# Patient Record
Sex: Male | Born: 1950 | Race: Black or African American | Hispanic: No | Marital: Married | Smoking: Never smoker
Health system: Southern US, Community
[De-identification: ages and names within clinical notes are randomized; demographics above are authoritative.]

## PROBLEM LIST (undated history)

## (undated) DIAGNOSIS — N200 Calculus of kidney: Secondary | ICD-10-CM

## (undated) DIAGNOSIS — K635 Polyp of colon: Secondary | ICD-10-CM

## (undated) DIAGNOSIS — S82009A Unspecified fracture of unspecified patella, initial encounter for closed fracture: Secondary | ICD-10-CM

## (undated) DIAGNOSIS — E1140 Type 2 diabetes mellitus with diabetic neuropathy, unspecified: Secondary | ICD-10-CM

## (undated) DIAGNOSIS — I10 Essential (primary) hypertension: Secondary | ICD-10-CM

## (undated) DIAGNOSIS — E119 Type 2 diabetes mellitus without complications: Secondary | ICD-10-CM

## (undated) HISTORY — PX: ORIF PATELLA: SHX5033

---

## 1999-05-24 ENCOUNTER — Emergency Department (HOSPITAL_COMMUNITY): Admission: EM | Admit: 1999-05-24 | Discharge: 1999-05-24 | Payer: Self-pay | Admitting: Emergency Medicine

## 1999-06-26 ENCOUNTER — Encounter (INDEPENDENT_AMBULATORY_CARE_PROVIDER_SITE_OTHER): Payer: Self-pay | Admitting: Specialist

## 1999-06-26 ENCOUNTER — Ambulatory Visit (HOSPITAL_COMMUNITY): Admission: RE | Admit: 1999-06-26 | Discharge: 1999-06-26 | Payer: Self-pay | Admitting: Gastroenterology

## 2003-05-15 ENCOUNTER — Emergency Department (HOSPITAL_COMMUNITY): Admission: EM | Admit: 2003-05-15 | Discharge: 2003-05-15 | Payer: Self-pay | Admitting: *Deleted

## 2004-07-06 ENCOUNTER — Encounter: Admission: RE | Admit: 2004-07-06 | Discharge: 2004-07-06 | Payer: Self-pay | Admitting: Family Medicine

## 2004-07-06 IMAGING — CR DG CHEST 2V
2 series · 2 of 2 positions shown · non-contrast
Comparison: none

CLINICAL DATA: Hyperglycemia.
 CHEST X-RAY:
 Two views of the chest show the lungs to be clear.  Minimal peribronchial thickening is noted.  The heart is within upper limits of normal.  No bony abnormality is seen.

[view not recorded (1 of 2)]
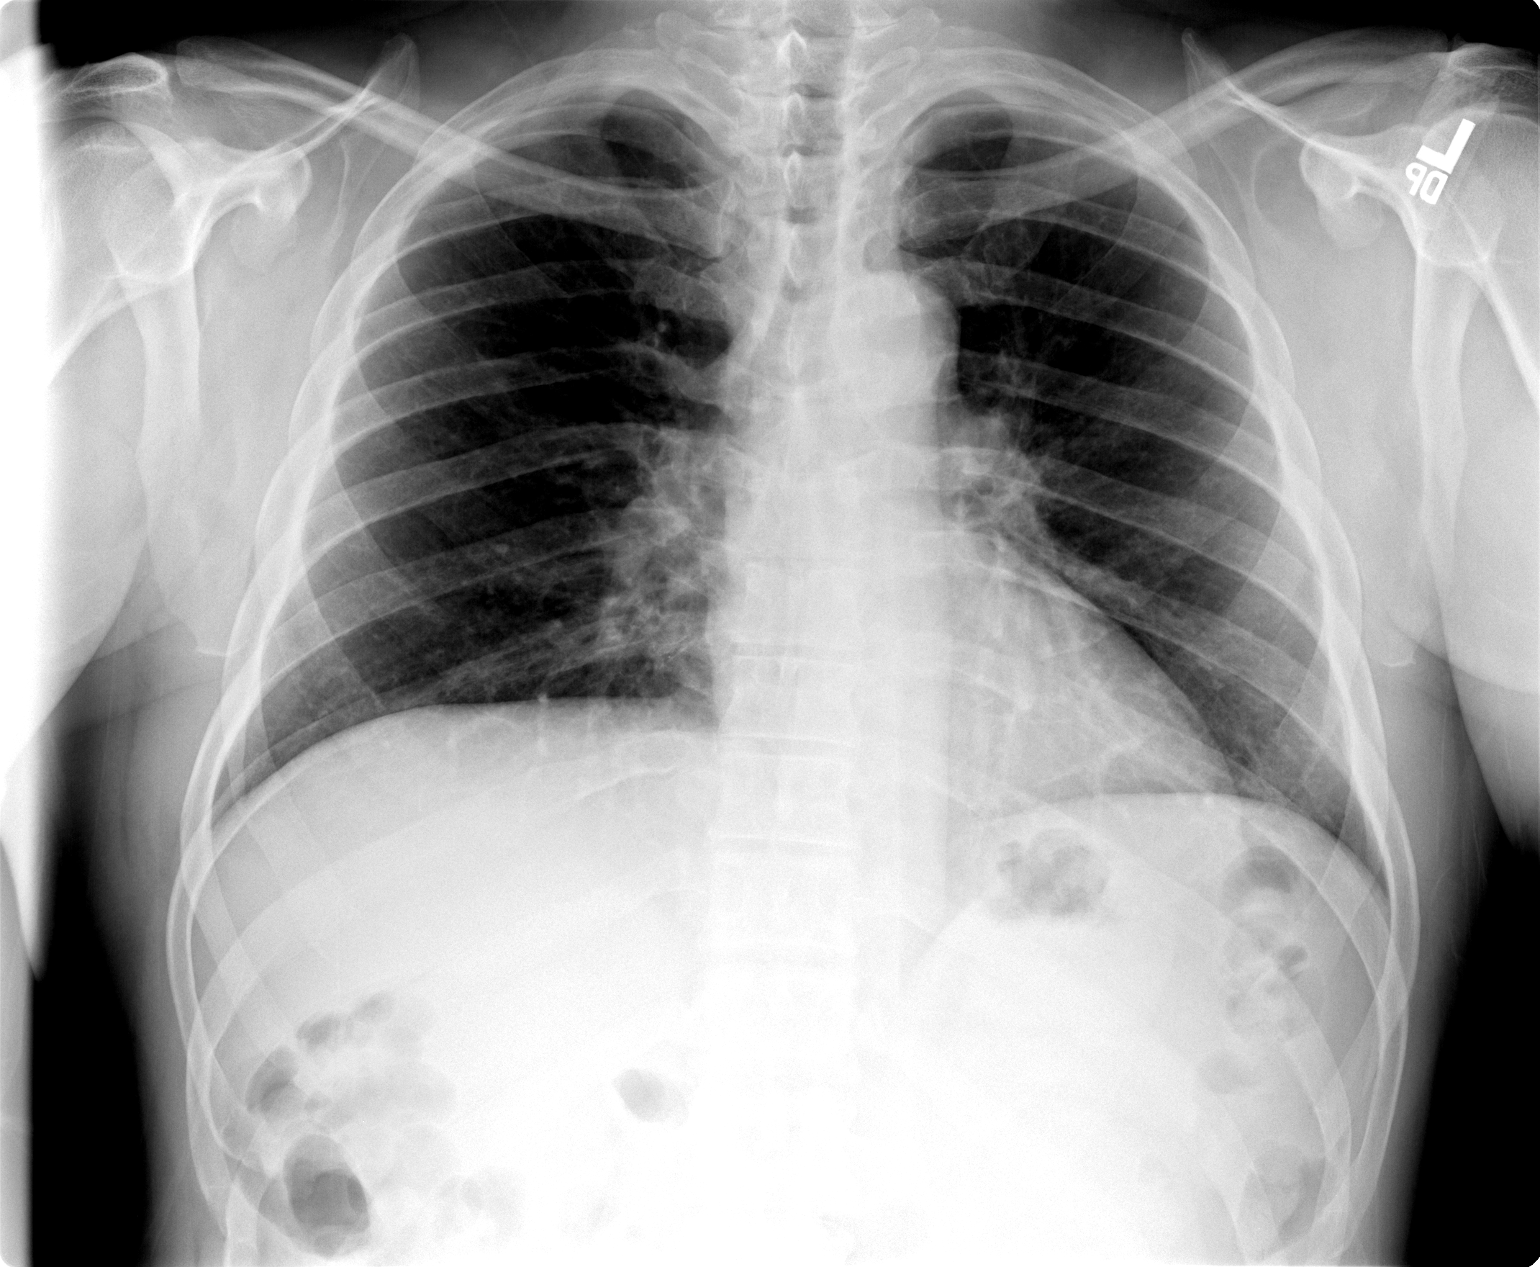

[view not recorded (2 of 2)]
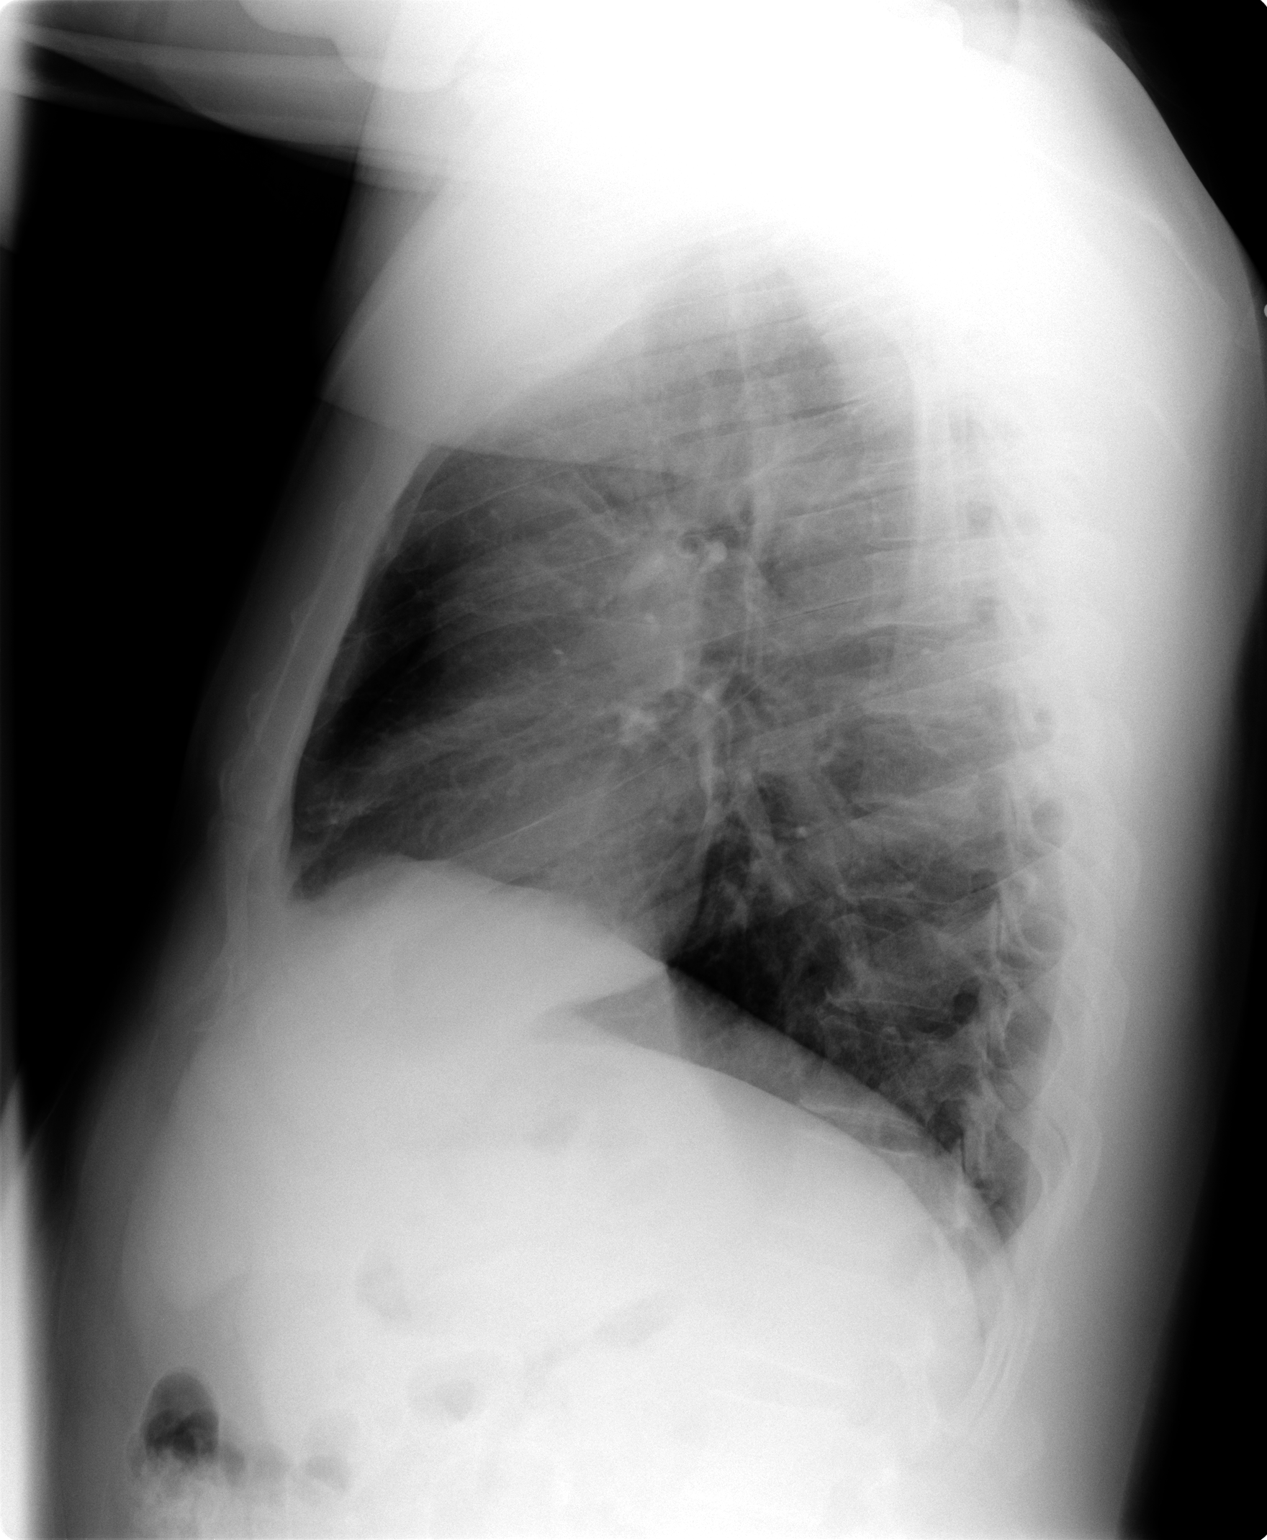

[2 of 2 positions shown; findings below may reference images not displayed]

IMPRESSION: No active lung disease.

## 2004-12-15 ENCOUNTER — Observation Stay (HOSPITAL_COMMUNITY): Admission: EM | Admit: 2004-12-15 | Discharge: 2004-12-15 | Payer: Self-pay | Admitting: *Deleted

## 2005-01-03 ENCOUNTER — Ambulatory Visit (HOSPITAL_COMMUNITY): Admission: RE | Admit: 2005-01-03 | Discharge: 2005-01-03 | Payer: Self-pay | Admitting: Gastroenterology

## 2009-12-21 ENCOUNTER — Emergency Department (HOSPITAL_COMMUNITY): Admission: EM | Admit: 2009-12-21 | Discharge: 2009-12-21 | Payer: Self-pay | Admitting: Emergency Medicine

## 2010-07-07 NOTE — Procedures (Signed)
[facility]  Patient:    Timothy Davenport, Timothy Davenport                        MRN: [id number] Proc. Date: 06/26/99 Attending:  Verlin Grills, M.D. CC:         Jethro Bastos, M.D., [facility]                           Procedure Report  PROCEDURES: 1. Colonoscopy. 2. Polypectomy.  REFERRING PHYSICIAN:  Jethro Bastos, M.D.  INDICATIONS:  Timothy Davenport is a 60 year old male.  On April 27, 1999, he underwent his first health maintenance flexible proctosigmoidoscopy.  A 7 mm polyp was discovered at approximately 15 cm from the anal verge.  Timothy Davenport is referred for diagnostic colonoscopy and polypectomy.  I discussed with Timothy Davenport the complications associated with colonoscopy and polypectomy, including a 15:1000 risk of bleeding and 4:1000 risk of intestinal perforation.  Timothy Davenport has signed the operative permit.  MEDICATION ALLERGIES:  None.  CHRONIC MEDICATIONS:  Lotensin and baby aspirin.  PAST MEDICAL HISTORY:  Hypertension and left knee surgery.  HABITS:  Timothy Davenport does not smoke cigarettes.  He consumes alcohol in moderation.  ENDOSCOPIST:  Verlin Grills, M.D.  PREMEDICATION:  Demerol 50 mg and Versed 7 mg.  ENDOSCOPE:  Olympus pediatric colonoscope.  DESCRIPTION OF PROCEDURE:  After obtaining informed consent, the patient was placed in the left lateral decubitus position.  I administered intravenous Demerol and intravenous Versed to achieve conscious sedation for the procedure.  The patients blood pressure, oxygen saturation, and cardiac rhythm were monitored throughout the procedure and documented in the medical record.  Anal inspection was normal.  The digital rectal exam revealed a non-nodular prostate.  The Olympus pediatric video colonoscope was introduced into the rectum and under direct vision and advanced to the cecum as identified by normal-appearing ileocecal valve and appendiceal  orifice.  Colon preparation for the exam today was excellent.  Rectal:  From the proximal rectum at 15 cm from the anal verge, a 5 mm sessile polyp was removed with the electrocautery snare and submitted for pathological interpretation.  Sigmoid colon and descending colon:  Left colonic diverticulosis.  Splenic flexure normal:  Normal.  Transverse colon:  Normal.  Hepatic flexure:  Normal.  Ascending colon:  Normal.  Cecum and ileocecal valve:  Normal.  ASSESSMENT: 1. From the proximal, a 5 mm sessile polyp was removed with electrocautery    snare and submitted for pathologic interpretation. 2. Left colonic diverticulitis.  RECOMMENDATIONS:  If the polyp returns adenomatous, Timothy Davenport should undergo a repeat colonoscopy in five years. DD:  06/26/99 TD:  06/27/99 Job: [id number]

## 2010-07-07 NOTE — Discharge Summary (Signed)
NAME:  Timothy Davenport, Timothy Davenport NO.:  [id number]   MEDICAL RECORD NO.:  [id number]          PATIENT TYPE:  OBV   LOCATION:  1824                         FACILITY:  [facility]   PHYSICIAN:  Melissa L. Ladona Ridgel, MD  DATE OF BIRTH:  Nov 27, 1950   DATE OF ADMISSION:  12/14/2004  DATE OF DISCHARGE:                                 DISCHARGE SUMMARY   DISCHARGING DIAGNOSES:  1.  Angioedema. The patient's only medication that could contribute to      angioedema was his Lotrel which has been held. The patient was admitted      with tongue-swelling, hypotension, and was treated with epinephrine,      Benadryl, Pepcid, and methylprednisolone with good results. The      patient's blood pressure at its lowest was 78/44. This a.m., blood      pressure has returned to 121/78. He has no tongue swelling. He is able      to swallow without difficulty. His blood sugars, however, are 286.  2.  Diabetes. Elevated blood sugars, hyperglycemia secondary to baseline      diabetes and steroids. The patient will be treated in the emergency room      with sliding scale insulin. He will be started on his metformin again      and given a dose of Glucotrol XL. He will also be given a prescription      for Glucotrol to take home over the weekend, and prescriptions for a      Glucometer, strips, and lancets. The patient will be instructed if his      blood sugar is less than 80 that he should discontinue his Glucotrol. If      his blood sugar is greater than 250, he should report to the Urgent Care      Center or emergency room for further treatment.  3.  Hypertension. At this time, the patient will remain on his      hydrochlorothiazide. I will ask Dr. Dorothe Pea to reevaluate his blood      pressure on Monday and consider adding another agent at that time.      Currently, his blood pressure is well controlled and with the      hypotension related to his disease, I do not want to add another agent      right this  moment.   MEDICATION LIST:  1.  Pepcid 20 mg twice daily until prednisone is discontinued.  2.  Prednisone 40 mg tapered down to 10 mg and then off.  3.  An EpiPen will be provided prescription for him.  4.  Metformin 500 mg twice a day.  5.  Hydrochlorothiazide 25 mg once daily.  6.  Glucotrol XL 5 mg once daily. The next dose will be on December 16, 2004      as he will be given a dose in the emergency room.  7.  He will be instructed not to take his Lotrel.  8.  Aspirin 81 mg once daily.  9.  He will be given a prescription for a Glucometer, strips, and lancets.  He will be instructed to check his blood sugar in the a.m. and the p.m.      If his blood sugar is less than 80 he should hold his Glucotrol. If his      blood sugars are greater than 250, he should go to the Urgent Care or      emergency room for further evaluation.   The patient will be instructed to see Dr. Marny Lowenstein on Monday for a  recheck of his blood pressure and blood sugars. I have spoken with Dr.  Dorothe Pea and updated him on his condition. The patient will be restricted to  a low carbohydrate diet.   On the day of discharge, the patient's blood pressure is 120/78, pulse is  73, respirations 20-26. Temperature is 98.5. He is a well-developed, well-  nourished African-American male in no acute distress. He is normocephalic,  atraumatic. Pupils equal, round, and reactive to light. Extraocular muscles  are intact. Mucous membranes are moist. He has no tongue swelling, no lip  swelling. Neck is supple. There is no JVD, no lymph nodes, and no carotid  bruits. His chest is clear to auscultation. No rhonchi, rales, or wheezes.  Cardiovascular is regular rate and rhythm, positive S1 and S2. No S3 or S4.  No murmurs, rubs or gallop. Abdomen is soft, nontender, nondistended, with  positive bowel sounds. Extremities show no clubbing, cyanosis, or edema.  Neurologically, he is awake, alert, oriented, and cranial  nerves II-XII are  intact. Power is 5/5, DTRs are 2+.   Currently pending is an i-STAT 8 and creatinine. Addendum will be provided  for this value. I would like to make sure his creatinine is appropriate for  the use of Glucotrol. If his creatinine is stable and his blood sugar is  better controlled, will discharge him to home from the emergency room to  follow up with Dr. Dorothe Pea on Monday. Please await addendum to this  discharge summary.      Melissa L. Ladona Ridgel, MD  Electronically Signed     MLT/MEDQ  D:  12/15/2004  T:  12/15/2004  Job:  [id number]   cc:   Jethro Bastos, M.D.  Fax: [phone]

## 2010-07-07 NOTE — H&P (Signed)
NAME:  Timothy Davenport, Timothy Davenport NO.:  [id number]   MEDICAL RECORD NO.:  [id number]          PATIENT TYPE:  OBV   LOCATION:  1824                         FACILITY:  [facility]   PHYSICIAN:  Jackie Plum, M.D.DATE OF BIRTH:  06/26/1950   DATE OF ADMISSION:  12/14/2004  DATE OF DISCHARGE:                                HISTORY & PHYSICAL   CHIEF COMPLAINT:  Swelling of the tongue.   HISTORY OF PRESENT ILLNESS:  The patient is a 60 year old African-American  gentleman with a history of hypertension and diabetes mellitus on  hydrochlorothiazide, Lotrel, aspirin, and metformin.  The patient came into  the ED with a 2-hour history of tongue swelling.  He had not taken any new  medications, be it over-the-counter or prescribed.  He had not eaten any new  or unusual food substances including seafood.  He came because he realized  his tongue had gotten progressively swollen this evening.  He denied any  chest pain or shortness of breath.  He had some difficulties handling his  secretions.  He had taken his Lotrel a couple of hours prior to the onset of  symptoms; however, he had taken Lotrel for a long time, according to the  patient.  He denied any fever, chills, cough, sputum production, joint  pains, abdominal pain, nausea, vomiting, diarrhea, constipation, frequency  of micturition, or dysuria.  In the emergency room, the patient was noted to  have a very swollen tongue which was protruding from his mouth according to  ED records.  He was seen immediately by the ED physician, whereupon IV  access was obtained, and IV Benadryl with IV Pepcid, as well as IV steroids  were initiated.  According to the patient, his symptoms improved  significantly post treatment, and _________ were asked to evaluate for  admission after receiving a subcutaneous injection of 0.3 mg of epinephrine  1:1000.   PAST MEDICAL HISTORY:  As stated above.   MEDICATIONS:  As stated above.   PAST MEDICAL  HISTORY:  Diabetes mellitus.   SOCIAL HISTORY:  The patient lives with his wife.  He smokes __________ of  cigarettes daily, according to him.   REVIEW OF SYSTEMS:  Significant positives and negatives as stated above;  otherwise unremarkable.   PHYSICAL EXAMINATION:  VITAL SIGNS:  Blood pressure 145/94, pulse 90,  respirations 22, temperature 97.1 degrees Fahrenheit.  Pulse oximetry of 99%  on oxygen 2 liters per nasal cannula.  GENERAL:  The patient was comfortable-looking, not in acute cardiopulmonary  distress.  HEENT:  Pupils equal, round and reactive to light.  Extraocular movements  were intact.  Oropharynx - he had a swollen tongue.  There was no scleral  pallor or edema.  There was no scleral icterus, as well.  NECK:  Supple.  No JVD.  CARDIOPULMONARY:  Auscultation revealed clear pulmonary lungs fields and  regular rate and rhythm.  ABDOMEN:  Soft.  Bowel sounds present.  Nontender.  EXTREMITIES:  The patient did not have any edema.  There was no cyanosis.  NEUROLOGIC:  Sensory exam was nonfocal.   LABORATORY DATA:  Lab work was reviewed.  Point of care cardiac markers were  obtained by the ED physician which were within normal limits.   IMPRESSION:  Angioedema, acute.  Precipitating factor probably is the Lotrel  (ACE inhibitor).  The patient will be admitted for observation, continued on  steroids and Pepcid with Benadryl.  We will keep subcutaneous epinephrine  injections at the bedside.  The patient will be discharged tomorrow.  He  will need an epinephrine pen, and referred to allergy and immunology for  further evaluation as an outpatient.      Jackie Plum, M.D.  Electronically Signed     GO/MEDQ  D:  12/15/2004  T:  12/15/2004  Job:  [id number]   cc:   Dr. Prudencio Burly(?)  [facility]

## 2010-07-07 NOTE — Op Note (Signed)
NAME:  Timothy Davenport, Timothy Davenport NO.:  [id number]   MEDICAL RECORD NO.:  [id number]          PATIENT TYPE:  AMB   LOCATION:  ENDO                         [facility]AC[facility]  [facility]   PHYSICIAN:  Danise Edge, M.D.   DATE OF BIRTH:  1950-03-28   DATE OF PROCEDURE:  01/03/2005  DATE OF DISCHARGE:                                 OPERATIVE REPORT   PROCEDURE:  Colonoscopy.   REFERRING PHYSICIAN:  Dr. Marny Lowenstein.   INDICATIONS:  Mr. [name] is a 60 year old male born may April 29, 1950. Five years ago, [name] underwent his first screening colonoscopy;  a 7 mm adenomatous polyp was removed from the rectum.   ENDOSCOPIST:  Danise Edge, M.D.   PREMEDICATION:  Versed 6 mg, Demerol 50 mg.   DESCRIPTION OF PROCEDURE:  After obtaining informed consent, [name] was  placed in the left lateral decubitus position. I administered intravenous  Demerol and intravenous Versed to achieve conscious sedation for the  procedure. The patient's blood pressure, oxygen saturation and cardiac  rhythm were monitored throughout the procedure and documented in the medical  record.   Anal inspection was normal. Digital digital rectal exam reveals a non-  nodular prostate. The Olympus adjustable pediatric colonoscope was  introduced into the rectum and advanced to the cecum. A normal-appearing  appendiceal orifice and ileocecal valve were identified. Colonic preparation  for the exam today was satisfactory.   RECTUM:  Normal. I was unable to perform a retroflexed view of the distal  rectum.  SIGMOID COLON AND DESCENDING COLON:  Left colonic diverticulosis.  SPLENIC FLEXURE:  Normal.  TRANSVERSE COLON:  Normal.  HEPATIC FLEXURE:  Normal.  ASCENDING COLON:  Normal.  CECUM AND ILEOCECAL VALVE:  Normal.   ASSESSMENT:  Normal screening proctocolonoscopy to the cecum. Left colonic  diverticulosis present.   RECOMMENDATIONS:  Repeat colonoscopy in 5 years.     ______________________________  Danise Edge, M.D.     MJ/MEDQ  D:  01/03/2005  T:  01/03/2005  Job:  [id number]   cc:   Jethro Bastos, M.D.  Fax: [phone]

## 2011-02-06 ENCOUNTER — Other Ambulatory Visit: Payer: Self-pay | Admitting: Family Medicine

## 2011-02-06 ENCOUNTER — Ambulatory Visit
Admission: RE | Admit: 2011-02-06 | Discharge: 2011-02-06 | Disposition: A | Discharge status: Home / Self Care | Payer: BC Managed Care – PPO | Source: Ambulatory Visit | Attending: Family Medicine | Admitting: Family Medicine

## 2011-02-06 DIAGNOSIS — R609 Edema, unspecified: Secondary | ICD-10-CM

## 2011-02-06 IMAGING — CR DG KNEE 1-2V*R*
2 series · 2 of 2 positions shown · non-contrast
Comparison: None.

CLINICAL DATA: Pain and swelling, no trauma

RIGHT KNEE - 1-2 VIEW

[t knee ap right]
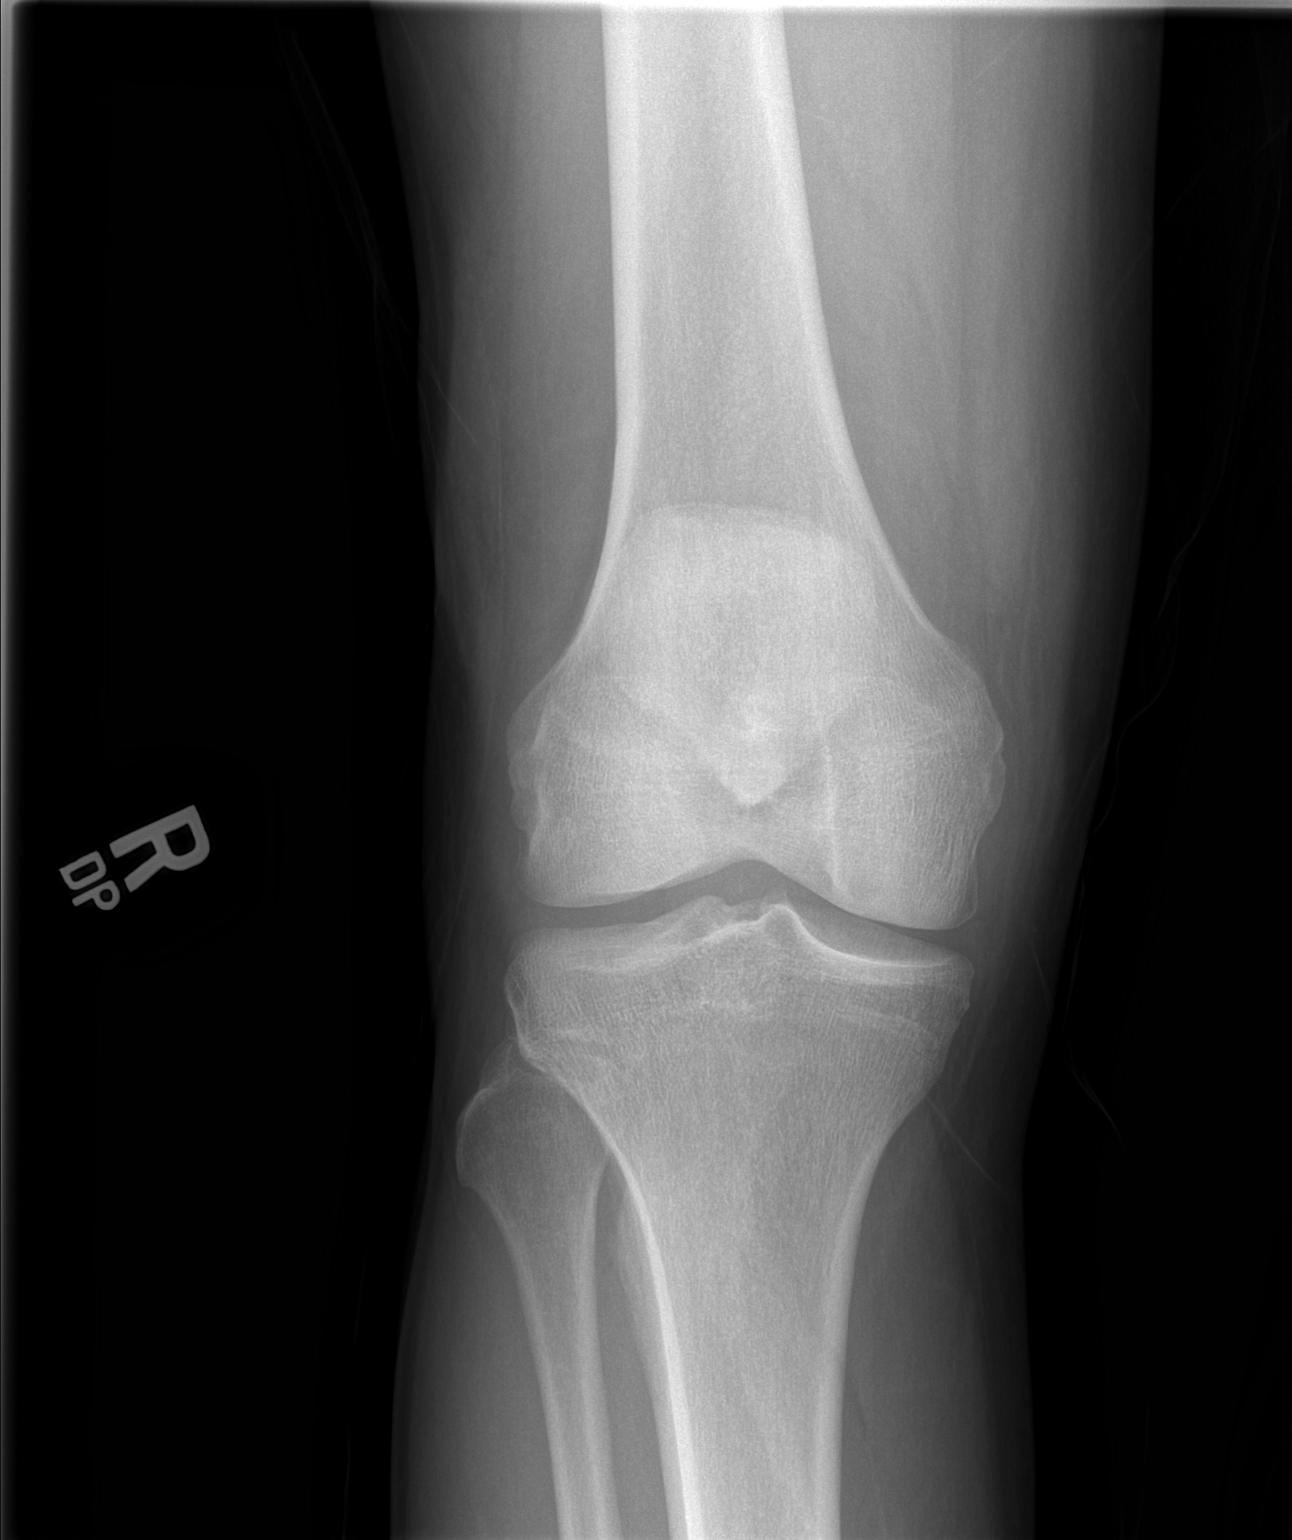

[t knee lat right]
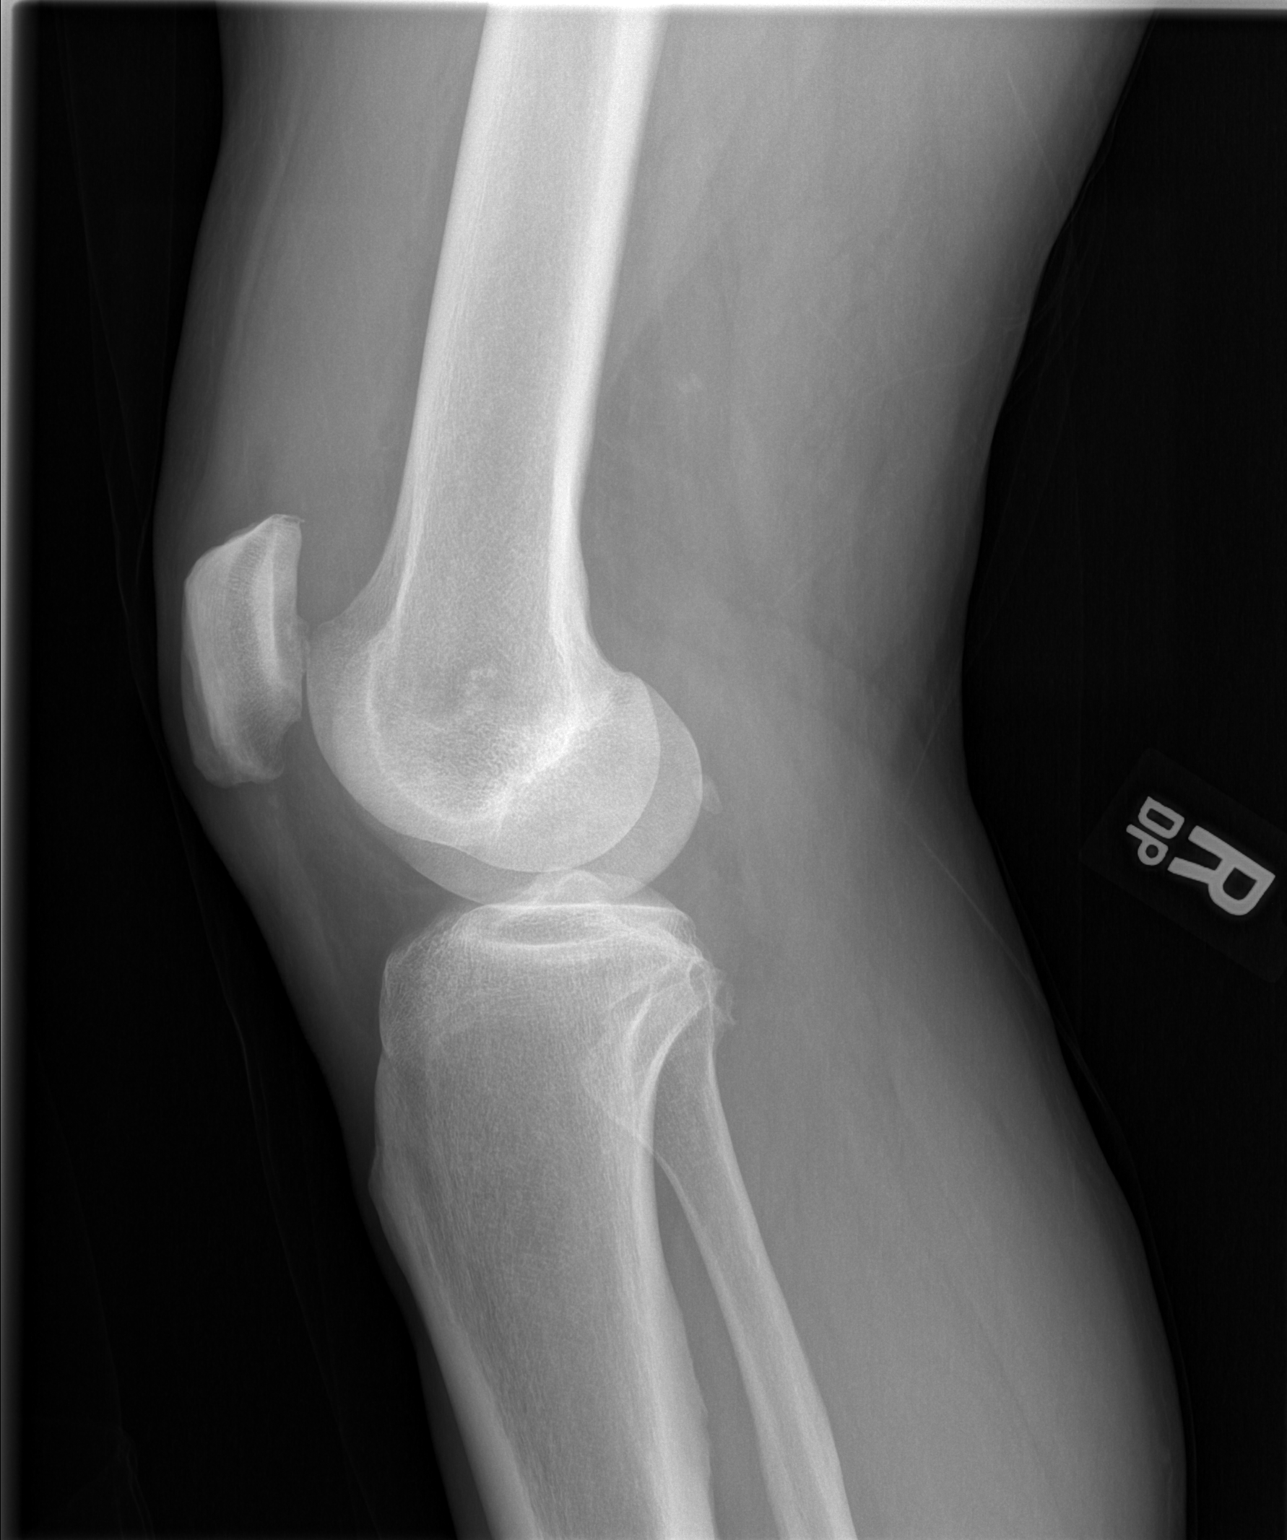

[2 of 2 positions shown; findings below may reference images not displayed]

FINDINGS: The right knee joint spaces appear normal.  No fracture
is seen.  There is however a moderate sized right knee joint
effusion present.  Alignment is normal.
IMPRESSION: Right knee joint effusion.  No fracture.

## 2016-12-06 ENCOUNTER — Other Ambulatory Visit: Payer: Self-pay | Admitting: Pharmacist

## 2018-03-03 ENCOUNTER — Encounter (HOSPITAL_COMMUNITY): Payer: Self-pay | Admitting: Internal Medicine

## 2018-03-03 ENCOUNTER — Emergency Department (HOSPITAL_COMMUNITY)
Admission: EM | Admit: 2018-03-03 | Discharge: 2018-03-03 | Disposition: A | Discharge status: Home / Self Care | Payer: No Typology Code available for payment source | Attending: Emergency Medicine | Admitting: Emergency Medicine

## 2018-03-03 ENCOUNTER — Emergency Department (HOSPITAL_COMMUNITY): Payer: No Typology Code available for payment source

## 2018-03-03 DIAGNOSIS — E11649 Type 2 diabetes mellitus with hypoglycemia without coma: Secondary | ICD-10-CM | POA: Diagnosis not present

## 2018-03-03 DIAGNOSIS — Z7984 Long term (current) use of oral hypoglycemic drugs: Secondary | ICD-10-CM | POA: Diagnosis not present

## 2018-03-03 DIAGNOSIS — Z7982 Long term (current) use of aspirin: Secondary | ICD-10-CM | POA: Diagnosis not present

## 2018-03-03 DIAGNOSIS — E162 Hypoglycemia, unspecified: Secondary | ICD-10-CM

## 2018-03-03 DIAGNOSIS — R4182 Altered mental status, unspecified: Secondary | ICD-10-CM | POA: Diagnosis present

## 2018-03-03 DIAGNOSIS — Z79899 Other long term (current) drug therapy: Secondary | ICD-10-CM | POA: Diagnosis not present

## 2018-03-03 DIAGNOSIS — R410 Disorientation, unspecified: Secondary | ICD-10-CM | POA: Insufficient documentation

## 2018-03-03 HISTORY — DX: Type 2 diabetes mellitus without complications: E11.9

## 2018-03-03 LAB — URINALYSIS, ROUTINE W REFLEX MICROSCOPIC
BILIRUBIN URINE: NEGATIVE
Bacteria, UA: NONE SEEN
Glucose, UA: >=500 mg/dL — AB
KETONES UR: NEGATIVE mg/dL
Leukocytes, UA: NEGATIVE
Nitrite: NEGATIVE
PH: 5 (ref 5.0–8.0)
Protein, ur: 30 mg/dL — AB
Specific Gravity, Urine: 1.007 (ref 1.005–1.030)

## 2018-03-03 LAB — CBC
HCT: 49.4 % (ref 39.0–52.0)
Hemoglobin: 16 g/dL (ref 13.0–17.0)
MCH: 27 pg (ref 26.0–34.0)
MCHC: 32.4 g/dL (ref 30.0–36.0)
MCV: 83.3 fL (ref 80.0–100.0)
PLATELETS: 170 10*3/uL (ref 150–400)
RBC: 5.93 MIL/uL — AB (ref 4.22–5.81)
RDW: 15.7 % — ABNORMAL HIGH (ref 11.5–15.5)
WBC: 4.9 10*3/uL (ref 4.0–10.5)
nRBC: 0 % (ref 0.0–0.2)

## 2018-03-03 LAB — COMPREHENSIVE METABOLIC PANEL
ALT: 28 U/L (ref 0–44)
AST: 47 U/L — ABNORMAL HIGH (ref 15–41)
Albumin: 3.9 g/dL (ref 3.5–5.0)
Alkaline Phosphatase: 50 U/L (ref 38–126)
Anion gap: 10 (ref 5–15)
BUN: 17 mg/dL (ref 8–23)
CO2: 24 mmol/L (ref 22–32)
Calcium: 10.1 mg/dL (ref 8.9–10.3)
Chloride: 102 mmol/L (ref 98–111)
Creatinine, Ser: 1.49 mg/dL — ABNORMAL HIGH (ref 0.61–1.24)
GFR calc Af Amer: 55 mL/min — ABNORMAL LOW (ref >60)
GFR calc non Af Amer: 47 mL/min — ABNORMAL LOW (ref >60)
Glucose, Bld: 42 mg/dL — CL (ref 70–99)
POTASSIUM: 3.7 mmol/L (ref 3.5–5.1)
Sodium: 136 mmol/L (ref 135–145)
Total Bilirubin: 0.6 mg/dL (ref 0.3–1.2)
Total Protein: 8.2 g/dL — ABNORMAL HIGH (ref 6.5–8.1)

## 2018-03-03 LAB — DIFFERENTIAL
ABS IMMATURE GRANULOCYTES: 0.02 10*3/uL (ref 0.00–0.07)
Basophils Absolute: 0 10*3/uL (ref 0.0–0.1)
Basophils Relative: 0 %
Eosinophils Absolute: 0 10*3/uL (ref 0.0–0.5)
Eosinophils Relative: 0 %
Immature Granulocytes: 0 %
Lymphocytes Relative: 13 %
Lymphs Abs: 0.6 10*3/uL — ABNORMAL LOW (ref 0.7–4.0)
Monocytes Absolute: 0.5 10*3/uL (ref 0.1–1.0)
Monocytes Relative: 10 %
Neutro Abs: 3.7 10*3/uL (ref 1.7–7.7)
Neutrophils Relative %: 77 %

## 2018-03-03 LAB — RAPID URINE DRUG SCREEN, HOSP PERFORMED
Amphetamines: NOT DETECTED
Barbiturates: NOT DETECTED
Benzodiazepines: NOT DETECTED
Cocaine: NOT DETECTED
Opiates: NOT DETECTED
Tetrahydrocannabinol: POSITIVE — AB

## 2018-03-03 LAB — ETHANOL: Alcohol, Ethyl (B): <10 mg/dL (ref <10)

## 2018-03-03 LAB — I-STAT TROPONIN, ED: TROPONIN I, POC: 0 ng/mL (ref 0.00–0.08)

## 2018-03-03 LAB — CBG MONITORING, ED
GLUCOSE-CAPILLARY: 125 mg/dL — AB (ref 70–99)
Glucose-Capillary: 189 mg/dL — ABNORMAL HIGH (ref 70–99)
Glucose-Capillary: 39 mg/dL — CL (ref 70–99)

## 2018-03-03 IMAGING — CT CT HEAD W/O CM
3 series · 15 of 47 positions shown, 18 images · non-contrast
Comparison: None.

CLINICAL DATA: Headache, hypoglycemia

EXAM:
CT HEAD WITHOUT CONTRAST
TECHNIQUE: Contiguous axial images were obtained from the base of the skull
through the vertex without intravenous contrast.

[Series 3: head 5.0 h30s · axial · 0.46mm/px · z∈[-125,+10]mm · 9 of 33 slices shown, 12 images]
[im 3/33  brain]
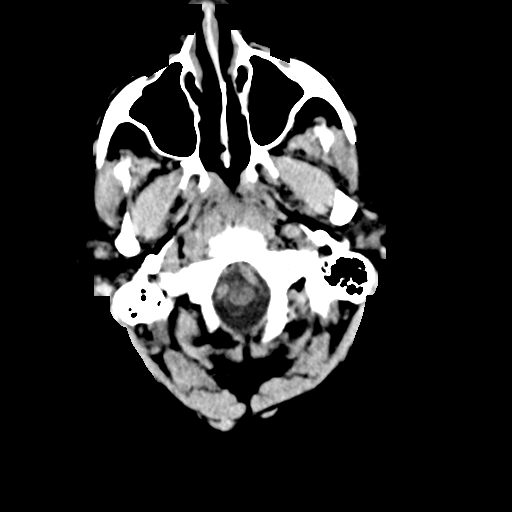
[im 3/33  bone]
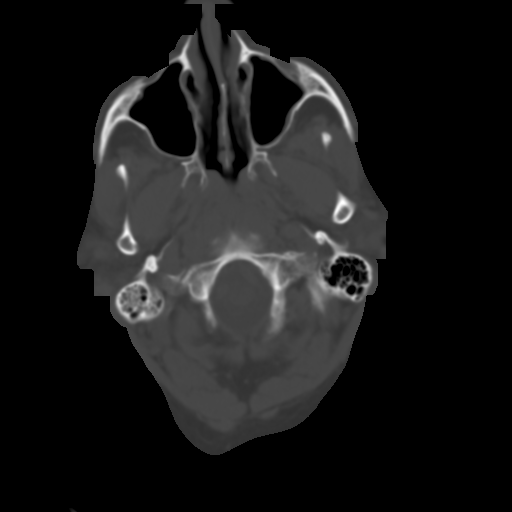
[im 6/33  brain]
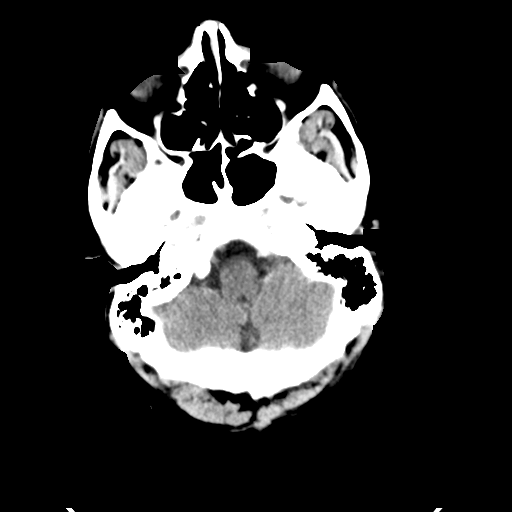
[im 9/33  brain]
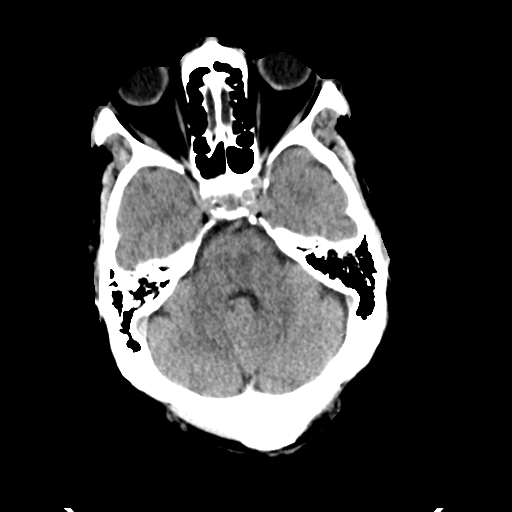
[im 13/33  brain]
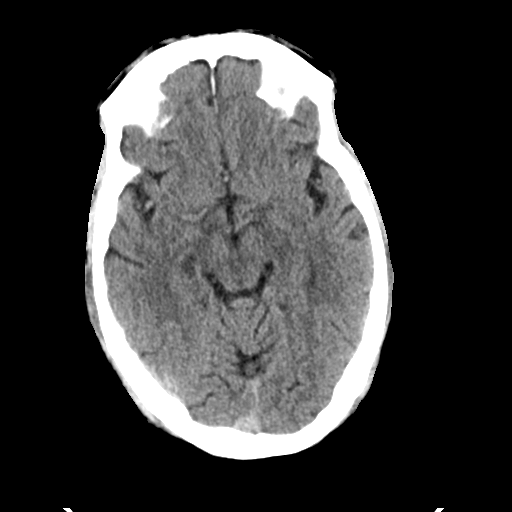
[im 17/33  brain]
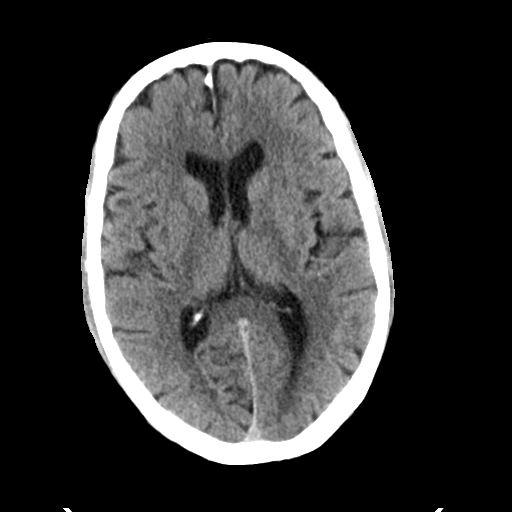
[im 17/33  bone]
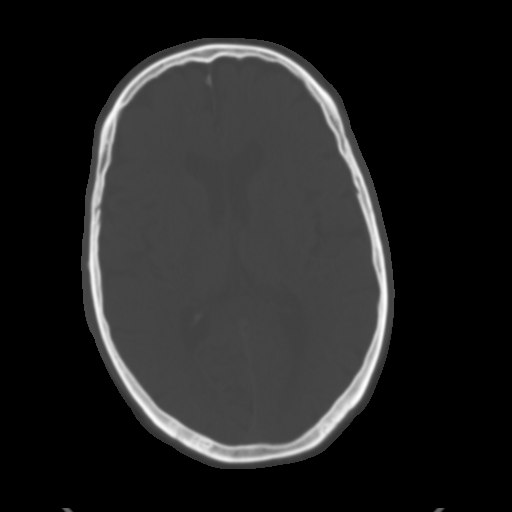
[im 20/33  brain]
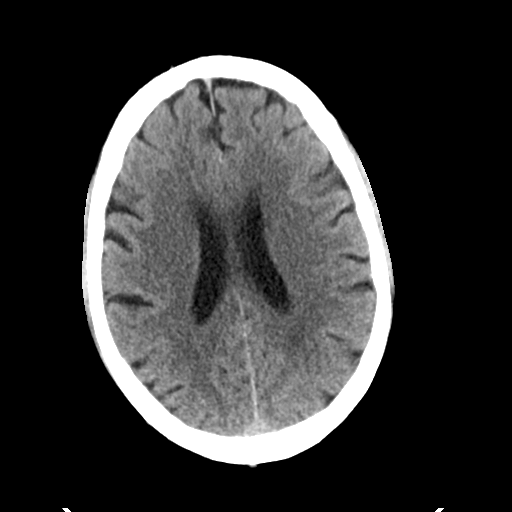
[im 24/33  brain]
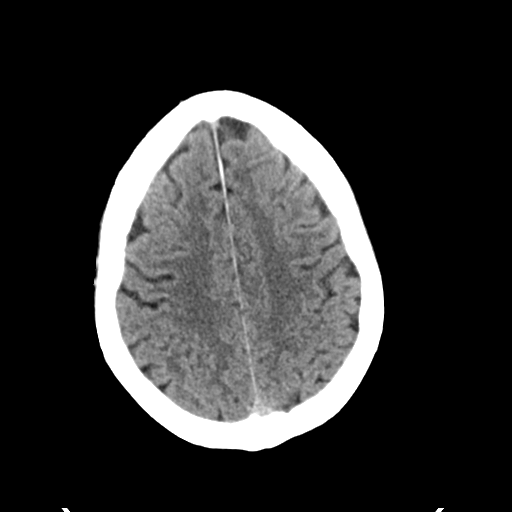
[im 27/33  brain]
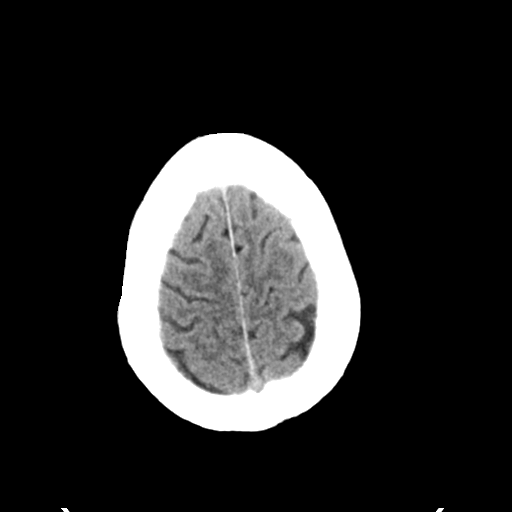
[im 30/33  brain]
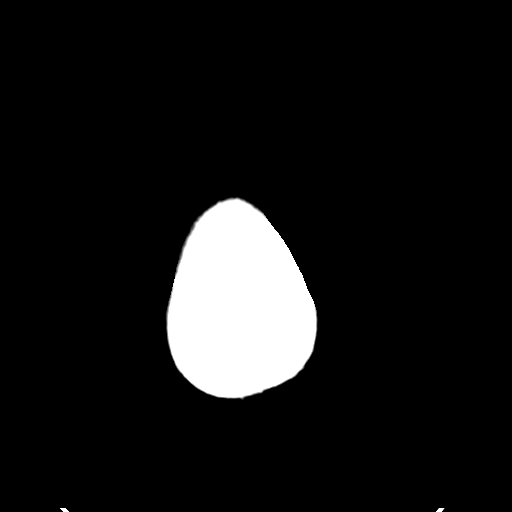
[im 30/33  bone]
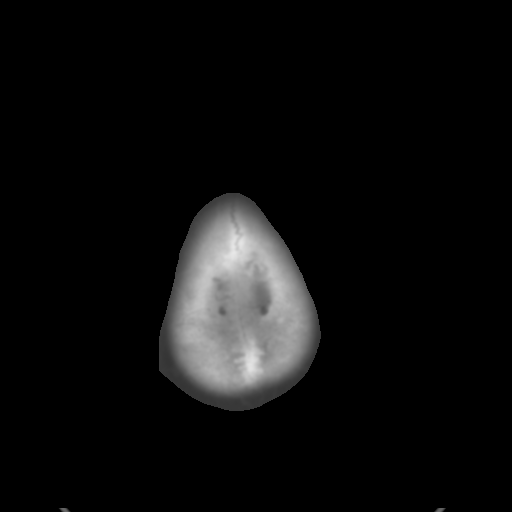

[Series 5: head 3.0 mpr cor · coronal · 0.31mm/px · 3 of 75 slices shown]
[im 25/75  brain]
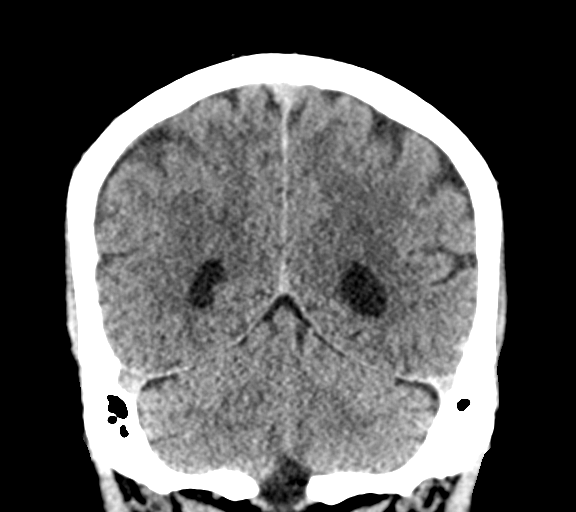
[im 33/75  brain]
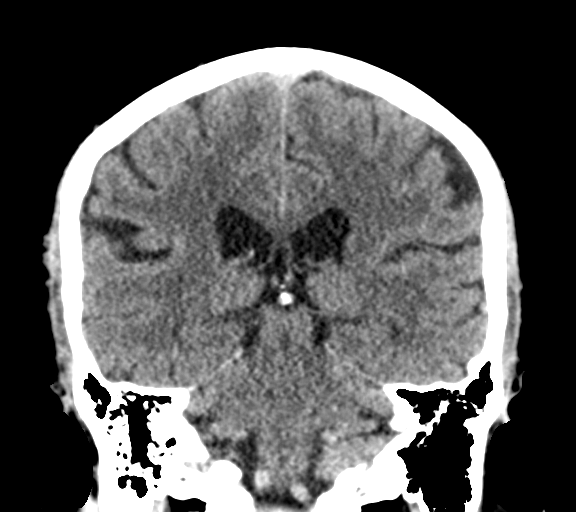
[im 42/75  brain]
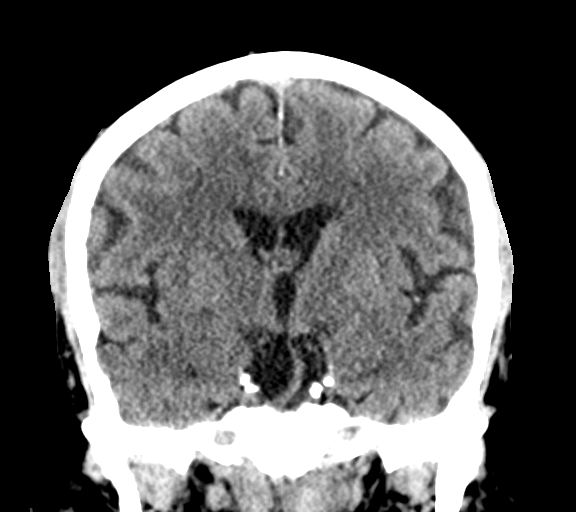

[Series 6: head 3.0 mpr sag · sagittal · 0.31mm/px · 3 of 57 slices shown]
[im 19/57  brain]
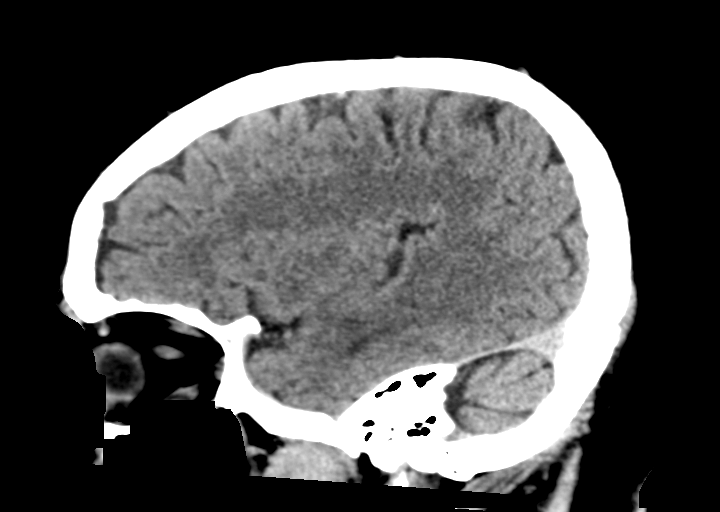
[im 29/57  brain]
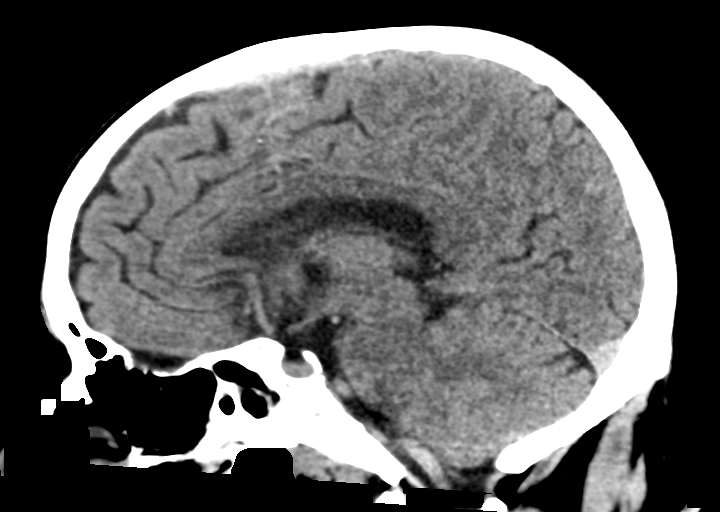
[im 38/57  brain]
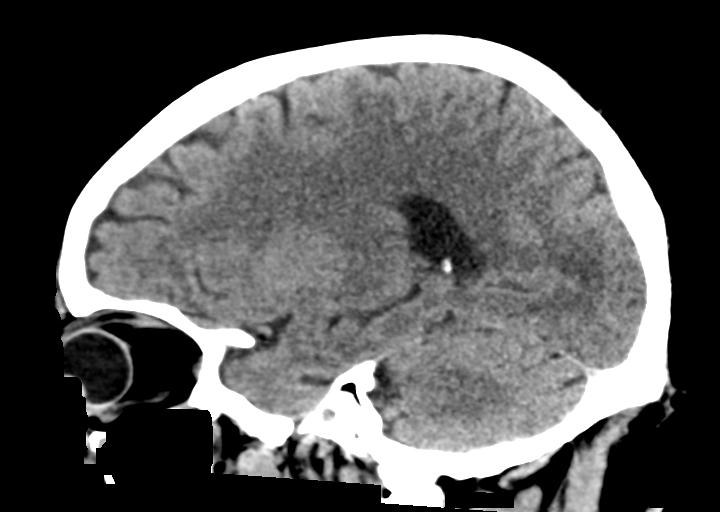

[15 of 47 positions shown; findings below may reference images not displayed]

FINDINGS: Brain: No evidence of acute infarction, hemorrhage, extra-axial
collection, ventriculomegaly, or mass effect. Generalized cerebral
atrophy. Periventricular white matter low attenuation likely
secondary to microangiopathy.

Vascular: Cerebrovascular atherosclerotic calcifications are noted.

Skull: Negative for fracture or focal lesion.

Sinuses/Orbits: Visualized portions of the orbits are unremarkable.
Visualized portions of the paranasal sinuses and mastoid air cells
are unremarkable.

Other: None.
IMPRESSION: No acute intracranial pathology.

## 2018-03-03 MED ORDER — DEXTROSE 50 % IV SOLN
INTRAVENOUS | Status: AC
  Filled 2018-03-03: qty 50

## 2018-03-03 MED ORDER — DEXTROSE 50 % IV SOLN
1.0000 | Freq: Once | INTRAVENOUS | Status: AC
  Administered 2018-03-03: 50 mL via INTRAVENOUS

## 2018-03-03 NOTE — ED Notes (Signed)
Pt aware of need for urine specimen. Urinal bedside.

## 2018-03-03 NOTE — ED Provider Notes (Signed)
[facility] EMERGENCY DEPARTMENT Provider Note   CSN: [id number] Arrival date & time: 03/03/18  0908     History   Chief Complaint Chief Complaint  Patient presents with  . Altered Mental Status    HPI CRISTOFHER Davenport is a 68 y.o. male.  HPI Patient presents with family for altered mental status.  Patient was in his normal state of health yesterday evening when he went to bed around 10 PM.  Family states that he woke up this morning and was attempting to enter a code into the home security device thinking it was his phone.  He also had trouble remembering his grandchildren's name.  Patient denies any current symptoms.  Denies visual changes or speech changes.  Denies focal weakness or numbness.  Family states he has been ambulating well.  He has had a cough for the past week.  No definite fever or chills.  No previously similar symptoms. Past Medical History:  Diagnosis Date  . Diabetes mellitus without complication ([place])     There are no active problems to display for this patient.         Home Medications    Prior to Admission medications   Medication Sig Start Date End Date Taking? Authorizing Provider  amLODipine (NORVASC) 10 MG tablet Take 10 mg by mouth daily.   Yes [provider]  aspirin 81 MG chewable tablet Chew 81 mg by mouth every morning.   Yes [provider]  glipiZIDE (GLUCOTROL) 5 MG tablet Take 5-10 mg by mouth See admin instructions. Take one tablet (5mg ) in the AM and 2 tablets (10mg ) in the evening   Yes [provider]  hydrALAZINE (APRESOLINE) 10 MG tablet Take 10 mg by mouth 2 (two) times daily.   Yes [provider]  losartan (COZAAR) 100 MG tablet Take 100 mg by mouth daily.   Yes [provider]  pravastatin (PRAVACHOL) 20 MG tablet Take 20 mg by mouth daily.   Yes [provider]  sildenafil (VIAGRA) 100 MG tablet Take 50 mg by mouth as directed. Not to exceed 1 dose per 24  hours   Yes [provider]    Family History No family history on file.  Social History Social History   Tobacco Use  . Smoking status: Not on file  Substance Use Topics  . Alcohol use: Not on file  . Drug use: Not on file     Allergies   Patient has no allergy information on record.   Review of Systems Review of Systems  Constitutional: Negative for chills and fever.  HENT: Negative for sore throat and trouble swallowing.   Eyes: Negative for visual disturbance.  Respiratory: Positive for cough. Negative for shortness of breath.   Cardiovascular: Negative for chest pain, palpitations and leg swelling.  Gastrointestinal: Negative for abdominal pain, constipation, diarrhea, nausea and vomiting.  Genitourinary: Negative for dysuria, flank pain and frequency.  Musculoskeletal: Negative for back pain, myalgias and neck pain.  Skin: Negative for rash and wound.  Neurological: Negative for dizziness, weakness, light-headedness, numbness and headaches.  Psychiatric/Behavioral: Positive for confusion.  All other systems reviewed and are negative.    Physical Exam Updated Vital Signs BP 136/83   Pulse (!) 50   Temp 97.8 F (36.6 C) (Oral)   Resp (!) 22   SpO2 97%   Physical Exam Vitals signs and nursing note reviewed.  Constitutional:      General: He is not in acute distress.  Appearance: Normal appearance. He is well-developed. He is not ill-appearing.  HENT:     Head: Normocephalic and atraumatic.     Comments: No obvious head trauma.  No intraoral trauma.  Cranial nerves II through XII grossly intact.    Nose: Nose normal.     Mouth/Throat:     Mouth: Mucous membranes are moist.     Pharynx: No oropharyngeal exudate or posterior oropharyngeal erythema.  Eyes:     Extraocular Movements: Extraocular movements intact.     Pupils: Pupils are equal, round, and reactive to light.  Neck:     Musculoskeletal: Normal range of motion and neck supple. No  neck rigidity or muscular tenderness.  Cardiovascular:     Rate and Rhythm: Normal rate and regular rhythm.     Heart sounds: No murmur. No friction rub. No gallop.   Pulmonary:     Effort: Pulmonary effort is normal. No respiratory distress.     Breath sounds: Normal breath sounds. No stridor. No wheezing, rhonchi or rales.  Chest:     Chest wall: No tenderness.  Abdominal:     General: Bowel sounds are normal. There is no distension.     Palpations: Abdomen is soft.     Tenderness: There is no abdominal tenderness. There is no guarding or rebound.  Musculoskeletal: Normal range of motion.        General: No swelling, tenderness, deformity or signs of injury.     Right lower leg: No edema.     Left lower leg: No edema.  Lymphadenopathy:     Cervical: No cervical adenopathy.  Skin:    General: Skin is warm and dry.     Findings: No erythema or rash.  Neurological:     General: No focal deficit present.     Mental Status: He is alert.     Comments: Oriented to person and place.  5/5 motor in all extremities.  Sensation intact.  Speech is clear.  Psychiatric:        Mood and Affect: Mood normal.        Behavior: Behavior normal.      ED Treatments / Results  Labs (all labs ordered are listed, but only abnormal results are displayed) Labs Reviewed  CBC - Abnormal; Notable for the following components:      Result Value   RBC 5.93 (*)    RDW 15.7 (*)    All other components within normal limits  DIFFERENTIAL - Abnormal; Notable for the following components:   Lymphs Abs 0.6 (*)    All other components within normal limits  COMPREHENSIVE METABOLIC PANEL - Abnormal; Notable for the following components:   Glucose, Bld 42 (*)    Creatinine, Ser 1.49 (*)    Total Protein 8.2 (*)    AST 47 (*)    GFR calc non Af Amer 47 (*)    GFR calc Af Amer 55 (*)    All other components within normal limits  RAPID URINE DRUG SCREEN, HOSP PERFORMED - Abnormal; Notable for the following  components:   Tetrahydrocannabinol POSITIVE (*)    All other components within normal limits  URINALYSIS, ROUTINE W REFLEX MICROSCOPIC - Abnormal; Notable for the following components:   Color, Urine STRAW (*)    Glucose, UA >=500 (*)    Hgb urine dipstick SMALL (*)    Protein, ur 30 (*)    All other components within normal limits  CBG MONITORING, ED - Abnormal; Notable for the following  components:   Glucose-Capillary 39 (*)    All other components within normal limits  CBG MONITORING, ED - Abnormal; Notable for the following components:   Glucose-Capillary 189 (*)    All other components within normal limits  CBG MONITORING, ED - Abnormal; Notable for the following components:   Glucose-Capillary 125 (*)    All other components within normal limits  ETHANOL  I-STAT TROPONIN, ED    EKG EKG Interpretation  Date/Time:  Monday March 03 2018 09:19:56 EST Ventricular Rate:  63 PR Interval:    QRS Duration: 92 QT Interval:  432 QTC Calculation: 443 R Axis:   66 Text Interpretation:  Sinus rhythm Probable left atrial enlargement Borderline low voltage, extremity leads Confirmed by Julianne Rice [phone]) on 03/03/2018 9:26:57 AM   Radiology Ct Head Wo Contrast  Result Date: 03/03/2018 CLINICAL DATA:  Headache, hypoglycemia EXAM: CT HEAD WITHOUT CONTRAST TECHNIQUE: Contiguous axial images were obtained from the base of the skull through the vertex without intravenous contrast. COMPARISON:  None. FINDINGS: Brain: No evidence of acute infarction, hemorrhage, extra-axial collection, ventriculomegaly, or mass effect. Generalized cerebral atrophy. Periventricular white matter low attenuation likely secondary to microangiopathy. Vascular: Cerebrovascular atherosclerotic calcifications are noted. Skull: Negative for fracture or focal lesion. Sinuses/Orbits: Visualized portions of the orbits are unremarkable. Visualized portions of the paranasal sinuses and mastoid air cells are  unremarkable. Other: None. IMPRESSION: No acute intracranial pathology. Electronically Signed   By: Kathreen Devoid   On: 03/03/2018 10:48    Procedures Procedures (including critical care time)  Medications Ordered in ED Medications  dextrose 50 % solution 50 mL (50 mLs Intravenous Given 03/03/18 0940)     Initial Impression / Assessment and Plan / ED Course  I have reviewed the triage vital signs and the nursing notes.  Pertinent labs & imaging results that were available during my care of the patient were reviewed by me and considered in my medical decision making (see chart for details).    Patient noted to be hypoglycemic in the emergency department.  Improved with D50.  Patient is now at his baseline mental status.  CT head without acute findings. Patient recently started on Actos by his primary physician 1 month ago.  States he has had intermittently low blood sugar since that time.  States he woke up last night diaphoretic and ate several apple slices. I have advised him to stop taking Actos until he is reevaluated by his primary physician.  Vital signs and blood sugar are stable in the emergency department.  Strict return precautions have been given.  Final Clinical Impressions(s) / ED Diagnoses   Final diagnoses:  Hypoglycemia  Disorientation    ED Discharge Orders    None       Julianne Rice, MD 03/03/18 1328

## 2018-03-03 NOTE — Discharge Instructions (Signed)
Stop taking your pioglitazone (Actos) until you are re-evaluated by your primary MD. Return for any concerns.

## 2018-03-03 NOTE — ED Notes (Signed)
Patient verbalizes understanding of discharge instructions. Opportunity for questioning and answers were provided. Armband removed by staff, pt discharged from ED.  

## 2018-03-03 NOTE — ED Notes (Signed)
Patient given juice and dextrose 50%.

## 2018-03-03 NOTE — ED Notes (Signed)
Pt's CBG result was 189. Informed Michelle - RN.

## 2018-03-03 NOTE — ED Triage Notes (Addendum)
Pt arrives from home with c/o loss of memory. Pt denies loss of consious but states he does not know how he got from home to ED. Pt denies pain. Family state pt went to bed normal  At about 10 pm. Now c/o confusion and memory loss.

## 2019-03-30 ENCOUNTER — Other Ambulatory Visit (HOSPITAL_COMMUNITY): Payer: Self-pay | Admitting: Family Medicine

## 2019-03-30 ENCOUNTER — Other Ambulatory Visit: Payer: Self-pay | Admitting: Family Medicine

## 2019-03-30 DIAGNOSIS — I639 Cerebral infarction, unspecified: Secondary | ICD-10-CM

## 2019-04-08 ENCOUNTER — Ambulatory Visit (HOSPITAL_COMMUNITY): Payer: BC Managed Care – PPO

## 2019-04-14 ENCOUNTER — Encounter (HOSPITAL_COMMUNITY): Payer: Self-pay

## 2019-04-14 ENCOUNTER — Ambulatory Visit (HOSPITAL_COMMUNITY): Payer: BC Managed Care – PPO

## 2019-04-16 ENCOUNTER — Ambulatory Visit: Payer: Self-pay

## 2019-05-15 ENCOUNTER — Telehealth: Payer: Self-pay

## 2019-05-15 NOTE — Telephone Encounter (Signed)
Called pt to schedule appointment as requested. Phone answered, immediately hung up after introduction.

## 2019-06-30 ENCOUNTER — Encounter (HOSPITAL_COMMUNITY): Payer: Self-pay

## 2019-06-30 ENCOUNTER — Other Ambulatory Visit: Payer: Self-pay

## 2019-06-30 ENCOUNTER — Inpatient Hospital Stay (HOSPITAL_COMMUNITY)
Admission: EM | Admit: 2019-06-30 | Discharge: 2019-07-07 | DRG: 029 | Disposition: A | Discharge status: Rehabilitation Facility | Payer: No Typology Code available for payment source | Attending: Neurosurgery | Admitting: Neurosurgery

## 2019-06-30 ENCOUNTER — Emergency Department (HOSPITAL_COMMUNITY): Payer: No Typology Code available for payment source

## 2019-06-30 DIAGNOSIS — W010XXA Fall on same level from slipping, tripping and stumbling without subsequent striking against object, initial encounter: Secondary | ICD-10-CM | POA: Diagnosis present

## 2019-06-30 DIAGNOSIS — M4804 Spinal stenosis, thoracic region: Secondary | ICD-10-CM | POA: Diagnosis present

## 2019-06-30 DIAGNOSIS — Z7982 Long term (current) use of aspirin: Secondary | ICD-10-CM

## 2019-06-30 DIAGNOSIS — Z7984 Long term (current) use of oral hypoglycemic drugs: Secondary | ICD-10-CM

## 2019-06-30 DIAGNOSIS — E1140 Type 2 diabetes mellitus with diabetic neuropathy, unspecified: Secondary | ICD-10-CM | POA: Diagnosis present

## 2019-06-30 DIAGNOSIS — G8222 Paraplegia, incomplete: Secondary | ICD-10-CM | POA: Diagnosis present

## 2019-06-30 DIAGNOSIS — N319 Neuromuscular dysfunction of bladder, unspecified: Secondary | ICD-10-CM | POA: Diagnosis not present

## 2019-06-30 DIAGNOSIS — I10 Essential (primary) hypertension: Secondary | ICD-10-CM | POA: Diagnosis present

## 2019-06-30 DIAGNOSIS — G8220 Paraplegia, unspecified: Secondary | ICD-10-CM | POA: Diagnosis not present

## 2019-06-30 DIAGNOSIS — Y838 Other surgical procedures as the cause of abnormal reaction of the patient, or of later complication, without mention of misadventure at the time of the procedure: Secondary | ICD-10-CM | POA: Diagnosis not present

## 2019-06-30 DIAGNOSIS — Z8249 Family history of ischemic heart disease and other diseases of the circulatory system: Secondary | ICD-10-CM

## 2019-06-30 DIAGNOSIS — R339 Retention of urine, unspecified: Secondary | ICD-10-CM | POA: Diagnosis not present

## 2019-06-30 DIAGNOSIS — G9761 Postprocedural hematoma of a nervous system organ or structure following a nervous system procedure: Secondary | ICD-10-CM | POA: Diagnosis not present

## 2019-06-30 DIAGNOSIS — Z20822 Contact with and (suspected) exposure to covid-19: Secondary | ICD-10-CM | POA: Diagnosis present

## 2019-06-30 DIAGNOSIS — M5124 Other intervertebral disc displacement, thoracic region: Secondary | ICD-10-CM | POA: Diagnosis present

## 2019-06-30 DIAGNOSIS — R609 Edema, unspecified: Secondary | ICD-10-CM | POA: Diagnosis not present

## 2019-06-30 DIAGNOSIS — M4714 Other spondylosis with myelopathy, thoracic region: Secondary | ICD-10-CM | POA: Diagnosis present

## 2019-06-30 DIAGNOSIS — Z419 Encounter for procedure for purposes other than remedying health state, unspecified: Secondary | ICD-10-CM

## 2019-06-30 DIAGNOSIS — G9520 Unspecified cord compression: Secondary | ICD-10-CM | POA: Diagnosis present

## 2019-06-30 DIAGNOSIS — Z8673 Personal history of transient ischemic attack (TIA), and cerebral infarction without residual deficits: Secondary | ICD-10-CM | POA: Diagnosis not present

## 2019-06-30 DIAGNOSIS — K592 Neurogenic bowel, not elsewhere classified: Secondary | ICD-10-CM | POA: Diagnosis not present

## 2019-06-30 DIAGNOSIS — R52 Pain, unspecified: Secondary | ICD-10-CM

## 2019-06-30 DIAGNOSIS — W19XXXA Unspecified fall, initial encounter: Secondary | ICD-10-CM

## 2019-06-30 HISTORY — DX: Essential (primary) hypertension: I10

## 2019-06-30 HISTORY — DX: Unspecified fracture of unspecified patella, initial encounter for closed fracture: S82.009A

## 2019-06-30 HISTORY — DX: Polyp of colon: K63.5

## 2019-06-30 HISTORY — DX: Calculus of kidney: N20.0

## 2019-06-30 HISTORY — DX: Type 2 diabetes mellitus with diabetic neuropathy, unspecified: E11.40

## 2019-06-30 LAB — CBC WITH DIFFERENTIAL/PLATELET
Abs Immature Granulocytes: 0.03 10*3/uL (ref 0.00–0.07)
Basophils Absolute: 0 10*3/uL (ref 0.0–0.1)
Basophils Relative: 0 %
Eosinophils Absolute: 0 10*3/uL (ref 0.0–0.5)
Eosinophils Relative: 0 %
HCT: 46.5 % (ref 39.0–52.0)
Hemoglobin: 14.8 g/dL (ref 13.0–17.0)
Immature Granulocytes: 0 %
Lymphocytes Relative: 15 %
Lymphs Abs: 1.2 10*3/uL (ref 0.7–4.0)
MCH: 27.5 pg (ref 26.0–34.0)
MCHC: 31.8 g/dL (ref 30.0–36.0)
MCV: 86.3 fL (ref 80.0–100.0)
Monocytes Absolute: 0.4 10*3/uL (ref 0.1–1.0)
Monocytes Relative: 5 %
Neutro Abs: 6.5 10*3/uL (ref 1.7–7.7)
Neutrophils Relative %: 80 %
Platelets: 199 10*3/uL (ref 150–400)
RBC: 5.39 MIL/uL (ref 4.22–5.81)
RDW: 15.9 % — ABNORMAL HIGH (ref 11.5–15.5)
WBC: 8.1 10*3/uL (ref 4.0–10.5)
nRBC: 0 % (ref 0.0–0.2)

## 2019-06-30 LAB — BASIC METABOLIC PANEL
Anion gap: 12 (ref 5–15)
BUN: 19 mg/dL (ref 8–23)
CO2: 28 mmol/L (ref 22–32)
Calcium: 11.1 mg/dL — ABNORMAL HIGH (ref 8.9–10.3)
Chloride: 102 mmol/L (ref 98–111)
Creatinine, Ser: 1.34 mg/dL — ABNORMAL HIGH (ref 0.61–1.24)
GFR calc Af Amer: >60 mL/min (ref >60)
GFR calc non Af Amer: 53 mL/min — ABNORMAL LOW (ref >60)
Glucose, Bld: 167 mg/dL — ABNORMAL HIGH (ref 70–99)
Potassium: 3.5 mmol/L (ref 3.5–5.1)
Sodium: 142 mmol/L (ref 135–145)

## 2019-06-30 LAB — PROTIME-INR
INR: 1 (ref 0.8–1.2)
Prothrombin Time: 12.3 seconds (ref 11.4–15.2)

## 2019-06-30 LAB — HEMOGLOBIN A1C
Hgb A1c MFr Bld: 6 % — ABNORMAL HIGH (ref 4.8–5.6)
Mean Plasma Glucose: 125.5 mg/dL

## 2019-06-30 LAB — GLUCOSE, CAPILLARY: Glucose-Capillary: 151 mg/dL — ABNORMAL HIGH (ref 70–99)

## 2019-06-30 LAB — SARS CORONAVIRUS 2 BY RT PCR (HOSPITAL ORDER, PERFORMED IN ~~LOC~~ HOSPITAL LAB): SARS Coronavirus 2: NEGATIVE

## 2019-06-30 LAB — APTT: aPTT: 29 seconds (ref 24–36)

## 2019-06-30 IMAGING — MR MR CERVICAL SPINE W/O CM
6 of 24 series · 6 of 48 positions shown · non-contrast
Comparison: Brain MRI today reported separately.
COMPARISON: Brain MRI today reported separately.

Addendum:
CLINICAL DATA: 69-year-old male with unexplained right lower
extremity weakness, progressive since [REDACTED]. Recent fall and
subsequent new left leg weakness. Hyperreflexia and clonus on lower
extremity exam. Query stroke, myelitis, central cord compression.

EXAM:
MRI CERVICAL AND THORACIC SPINE WITHOUT CONTRAST
TECHNIQUE: Multiplanar and multiecho pulse sequences of the cervical spine, to
include the craniocervical junction and cervicothoracic junction,
and the thoracic spine, were obtained without intravenous contrast.

[Series 5: T2 · axial · 5.0mm · 0.23mm/px · 1 of 26 slices shown (1 of 6)]
[im 1/26]
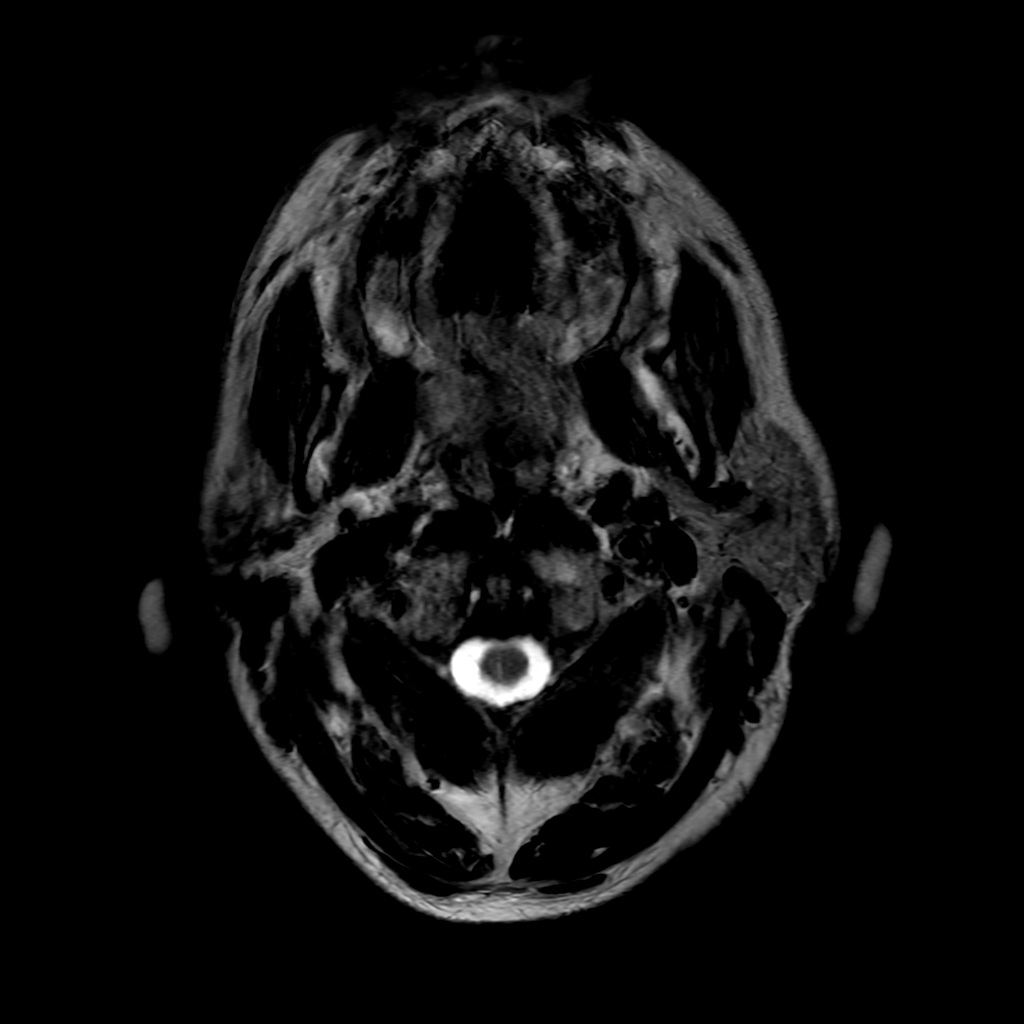

[Series 10: T2 · coronal · 5.0mm · 0.20mm/px · 1 of 27 slices shown (2 of 6)]
[im 1/27]
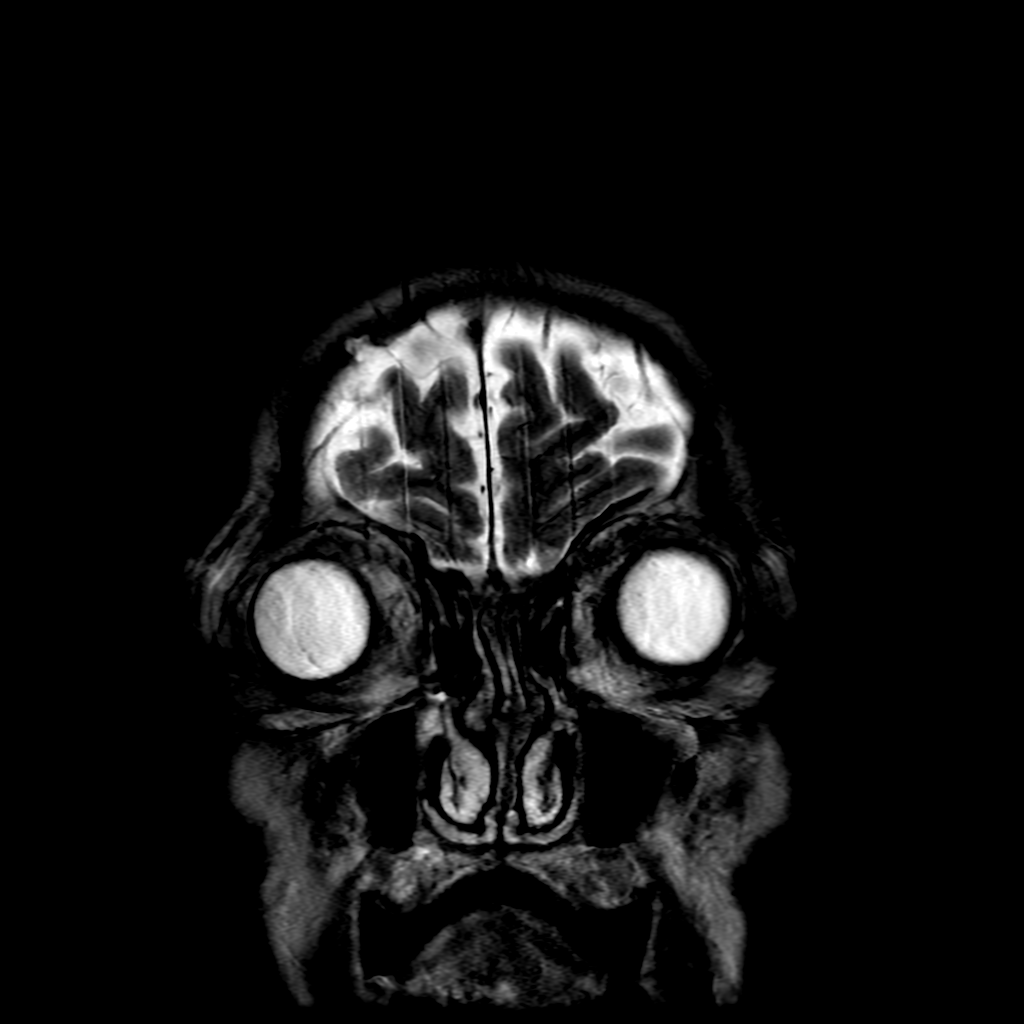

[Series 16: T2 · sagittal · 3.0mm · 0.43mm/px · 1 of 18 slices shown (3 of 6)]
[im 1/18]
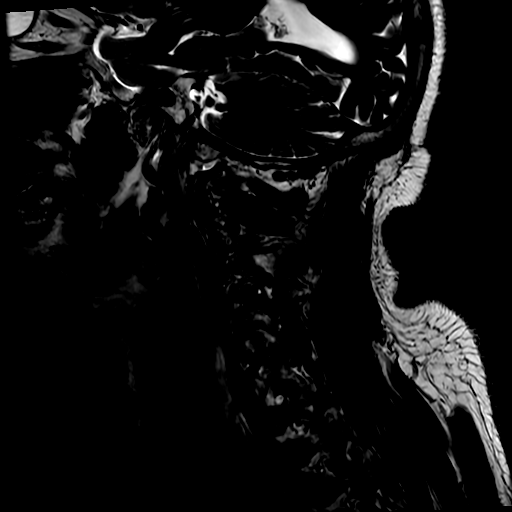

[Series 19: T2 · axial · 3.0mm · 0.35mm/px · 1 of 27 slices shown (4 of 6)]
[im 1/27]
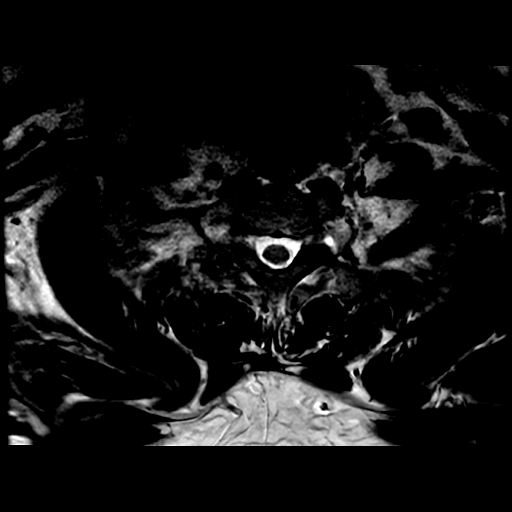

[Series 20: T2 · sagittal · 3.0mm · 0.66mm/px · 1 of 20 slices shown (5 of 6)]
[im 1/20]
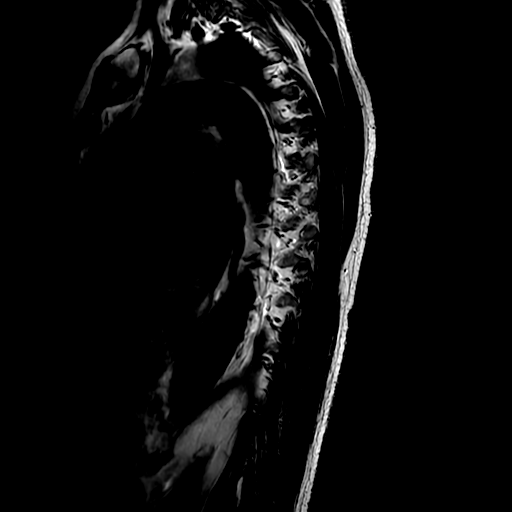

[Series 24: T2 · axial · 4.0mm · 0.39mm/px · 1 of 22 slices shown (6 of 6)]
[im 1/22]
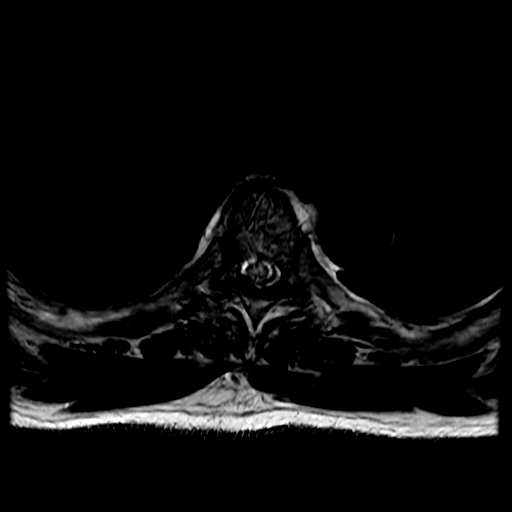

[6 of 48 positions shown; findings below may reference images not displayed]

FINDINGS: MRI CERVICAL SPINE FINDINGS

Alignment: Straightening of cervical lordosis. Subtle
anterolisthesis at C3-C4 and C7-T1.

Vertebrae: No marrow edema or evidence of acute osseous abnormality.
Visualized bone marrow signal is within normal limits.

Cord: Cervical spinal cord signal remains normal despite several
levels of degenerative spinal stenosis with mild cord mass effect
detailed below.

Posterior Fossa, vertebral arteries, paraspinal tissues:
Cervicomedullary junction is within normal limits. Brain findings
reported separately today. Preserved major vascular flow voids in
the neck. Negative visible neck soft tissues and right lung apex.

Disc levels:

Widespread cervical spine degeneration, most notable for:

C3-C4: Circumferential disc bulge and endplate spurring plus facet
hypertrophy. Borderline to mild spinal stenosis with moderate to
severe C4 foraminal stenosis greater on the left.

C4-C5: Disc space loss with circumferential disc osteophyte complex
and facet hypertrophy. Mild spinal stenosis with up to mild spinal
cord mass effect. Severe bilateral C5 foraminal stenosis.

C5-C6: Disc space loss with bulky circumferential disc osteophyte
complex. Mild facet but moderate ligament flavum hypertrophy. Spinal
stenosis with mild spinal cord mass effect. Severe bilateral C6
foraminal stenosis.

C6-C7: Disc space loss with circumferential disc osteophyte complex.
Bulky right paracentral posterior component of disc extrusion
(series 19, image 20). Mild posterior element hypertrophy. Mild
spinal stenosis and right hemi cord mass effect. Moderate left and
moderate to severe right C7 foraminal stenosis.

MRI THORACIC SPINE FINDINGS

Segmentation: Appears to be normal.

Alignment: Preserved thoracic kyphosis. No significant thoracic
scoliosis. There is subtle degenerative appearing anterolisthesis at
T9-T10.

Vertebrae: No marrow edema or evidence of acute osseous abnormality.
Probable benign vertebral body hemangioma centrally at T10 although
with decreased T1 signal. Elsewhere visible bone marrow signal is
within normal limits.

Cord: There is broad-based compression of the thoracic spinal cord
at the T6 level (series 20, image 12) which appears to be
degenerative in nature. There is associated abnormal T2 and STIR
hyperintense signal within the cord at the level of compression, as
well as just below the compressed level extending to mid T7 (also
image 12) which resembles myelomalacia on axial series 25, image 6.
No definite spinal cord hemorrhage. Above the level of compression
there is also a smaller focus of abnormal central to slightly left
paracentral cord signal superiorly at T5 best seen on series 24,
image 14. This is less specific and might be a small syrinx.

Below T7 thoracic spinal cord signal appears normal to the conus
which is at L1.

Paraspinal and other soft tissues: Negative visible thoracic and
upper abdominal viscera. Thoracic paraspinal soft tissues are within
normal limits.

Disc levels:

Widespread thoracic spine degeneration, but most notable for:

T2-T3: Multifactorial mild spinal stenosis related to disc bulging
and posterior element hypertrophy. Mild if any cord mass effect.
Moderate to severe bilateral T2 foraminal stenosis.

T6-T7: Severe loss of the disc space with what appears to be a bulky
extruded posterior disc fragment with mostly cephalad migration
(series 20 image 12 and series 21, image 12). Superimposed moderate
posterior element hypertrophy.

Subsequent severe spinal stenosis and cord compression (series 25,
image 4) with abnormal cord signal. At least moderate associated
bilateral T6 foraminal stenosis.

T7-T8 through T9-T10: Disc bulging, endplate spurring and up to
moderate degenerative facet arthropathy including trace degenerative
facet joint fluid at the latter 2 levels. Borderline to mild spinal
stenosis at these levels with no cord mass effect. Bilateral
foraminal stenosis which is moderate on the left at the T8 nerve
level.
IMPRESSION: 1. Severe degenerative spinal stenosis and Severe SPINAL CORD
COMPRESSION at T6-T7 related to bulky disc extrusion. Abnormal cord
signal likely due to a combination of cord
edema/contusion/myelomalacia in this clinical setting. There might
also be a small focus of secondary spinal cord syrinx at T5.

2. Other cervical and thoracic degenerative spinal stenosis with
only mild cord mass effect. No other cord signal abnormality.

3. No evidence of acute osseous abnormality or ligamentous injury in
the cervical or thoracic spine.

Neurology consultant was paged via amion regarding these findings at
[1V] hours. But I will also discuss this exam with the Emergency
Department provider and issue an addendum.

ADDENDUM:
Critical Value/emergent results were called by telephone at the time
of interpretation on [DATE] at [1V] hours to ED Dr. AUVA
AUVA who verbally acknowledged these results.

And he had already discussed the exam with Neurology at that time.

*** End of Addendum ***
FINDINGS: MRI CERVICAL SPINE FINDINGS

Alignment: Straightening of cervical lordosis. Subtle
anterolisthesis at C3-C4 and C7-T1.

Vertebrae: No marrow edema or evidence of acute osseous abnormality.
Visualized bone marrow signal is within normal limits.

Cord: Cervical spinal cord signal remains normal despite several
levels of degenerative spinal stenosis with mild cord mass effect
detailed below.

Posterior Fossa, vertebral arteries, paraspinal tissues:
Cervicomedullary junction is within normal limits. Brain findings
reported separately today. Preserved major vascular flow voids in
the neck. Negative visible neck soft tissues and right lung apex.

Disc levels:

Widespread cervical spine degeneration, most notable for:

C3-C4: Circumferential disc bulge and endplate spurring plus facet
hypertrophy. Borderline to mild spinal stenosis with moderate to
severe C4 foraminal stenosis greater on the left.

C4-C5: Disc space loss with circumferential disc osteophyte complex
and facet hypertrophy. Mild spinal stenosis with up to mild spinal
cord mass effect. Severe bilateral C5 foraminal stenosis.

C5-C6: Disc space loss with bulky circumferential disc osteophyte
complex. Mild facet but moderate ligament flavum hypertrophy. Spinal
stenosis with mild spinal cord mass effect. Severe bilateral C6
foraminal stenosis.

C6-C7: Disc space loss with circumferential disc osteophyte complex.
Bulky right paracentral posterior component of disc extrusion
(series 19, image 20). Mild posterior element hypertrophy. Mild
spinal stenosis and right hemi cord mass effect. Moderate left and
moderate to severe right C7 foraminal stenosis.

MRI THORACIC SPINE FINDINGS

Segmentation: Appears to be normal.

Alignment: Preserved thoracic kyphosis. No significant thoracic
scoliosis. There is subtle degenerative appearing anterolisthesis at
T9-T10.

Vertebrae: No marrow edema or evidence of acute osseous abnormality.
Probable benign vertebral body hemangioma centrally at T10 although
with decreased T1 signal. Elsewhere visible bone marrow signal is
within normal limits.

Cord: There is broad-based compression of the thoracic spinal cord
at the T6 level (series 20, image 12) which appears to be
degenerative in nature. There is associated abnormal T2 and STIR
hyperintense signal within the cord at the level of compression, as
well as just below the compressed level extending to mid T7 (also
image 12) which resembles myelomalacia on axial series 25, image 6.
No definite spinal cord hemorrhage. Above the level of compression
there is also a smaller focus of abnormal central to slightly left
paracentral cord signal superiorly at T5 best seen on series 24,
image 14. This is less specific and might be a small syrinx.

Below T7 thoracic spinal cord signal appears normal to the conus
which is at L1.

Paraspinal and other soft tissues: Negative visible thoracic and
upper abdominal viscera. Thoracic paraspinal soft tissues are within
normal limits.

Disc levels:

Widespread thoracic spine degeneration, but most notable for:

T2-T3: Multifactorial mild spinal stenosis related to disc bulging
and posterior element hypertrophy. Mild if any cord mass effect.
Moderate to severe bilateral T2 foraminal stenosis.

T6-T7: Severe loss of the disc space with what appears to be a bulky
extruded posterior disc fragment with mostly cephalad migration
(series 20 image 12 and series 21, image 12). Superimposed moderate
posterior element hypertrophy.

Subsequent severe spinal stenosis and cord compression (series 25,
image 4) with abnormal cord signal. At least moderate associated
bilateral T6 foraminal stenosis.

T7-T8 through T9-T10: Disc bulging, endplate spurring and up to
moderate degenerative facet arthropathy including trace degenerative
facet joint fluid at the latter 2 levels. Borderline to mild spinal
stenosis at these levels with no cord mass effect. Bilateral
foraminal stenosis which is moderate on the left at the T8 nerve
level.
IMPRESSION: 1. Severe degenerative spinal stenosis and Severe SPINAL CORD
COMPRESSION at T6-T7 related to bulky disc extrusion. Abnormal cord
signal likely due to a combination of cord
edema/contusion/myelomalacia in this clinical setting. There might
also be a small focus of secondary spinal cord syrinx at T5.

2. Other cervical and thoracic degenerative spinal stenosis with
only mild cord mass effect. No other cord signal abnormality.

3. No evidence of acute osseous abnormality or ligamentous injury in
the cervical or thoracic spine.

Neurology consultant was paged via amion regarding these findings at
[1V] hours. But I will also discuss this exam with the Emergency
Department provider and issue an addendum.

## 2019-06-30 IMAGING — DX DG RIBS W/ CHEST 3+V*L*
5 series · 5 of 5 positions shown · non-contrast
Comparison: [DATE]

CLINICAL DATA: Left axillary pain.  Fall.

EXAM:
LEFT RIBS AND CHEST - 3+ VIEW

[chest ap]
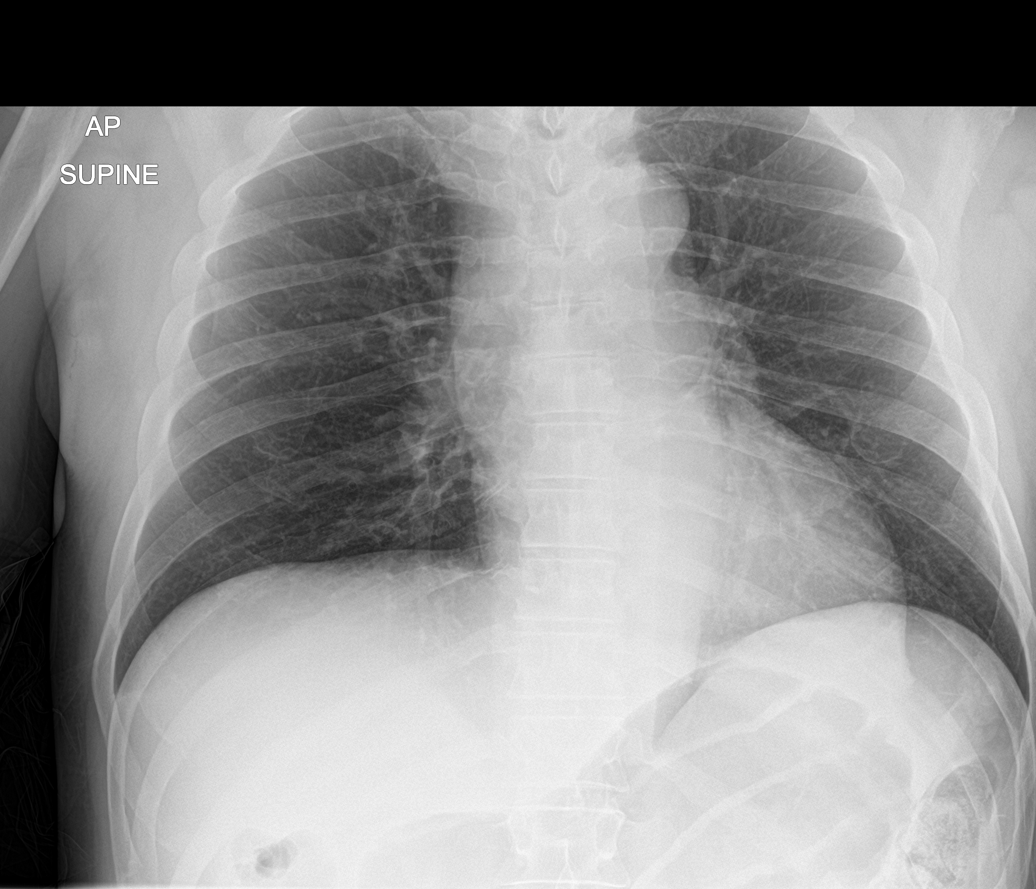

[rib ap (1 of 2)]
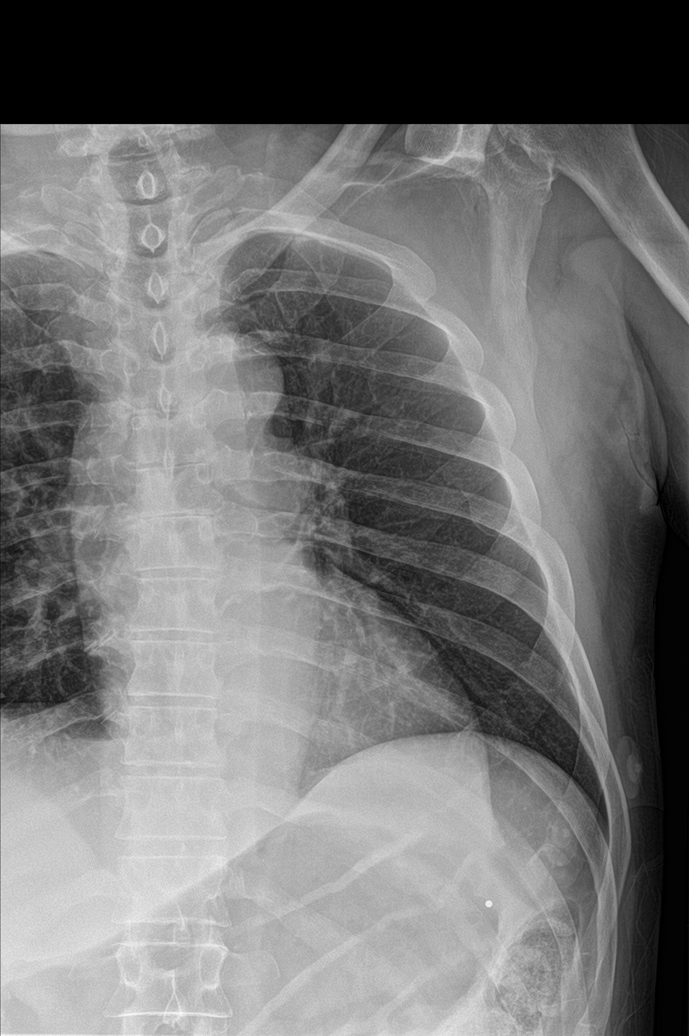

[rib ap obl (1 of 2)]
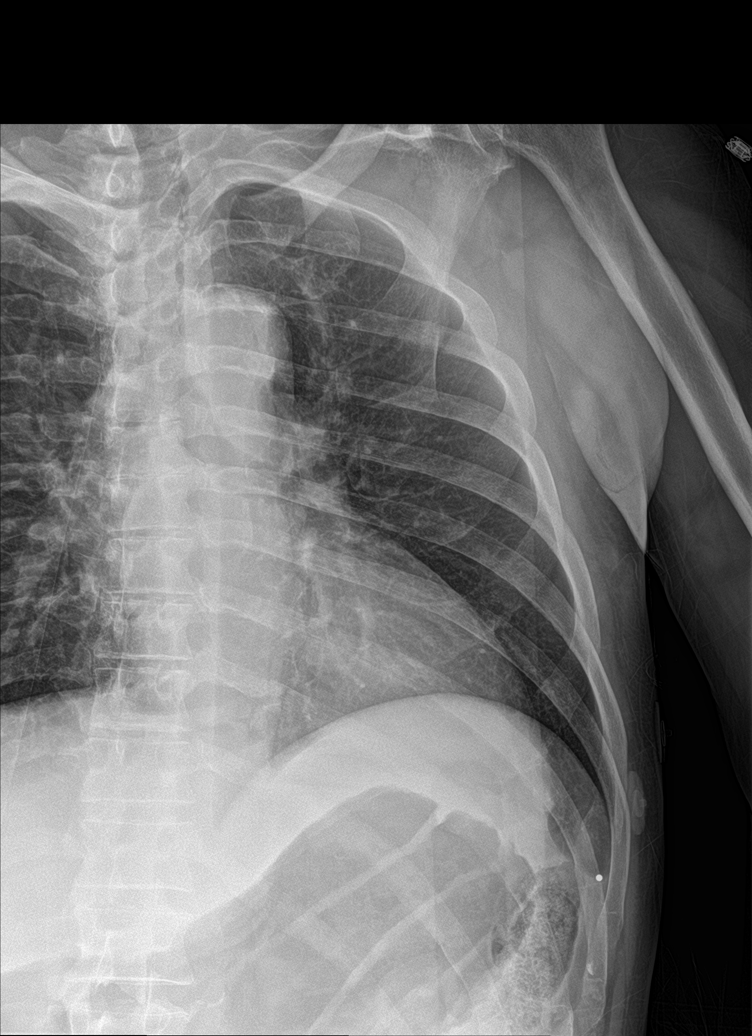

[rib ap (2 of 2)]
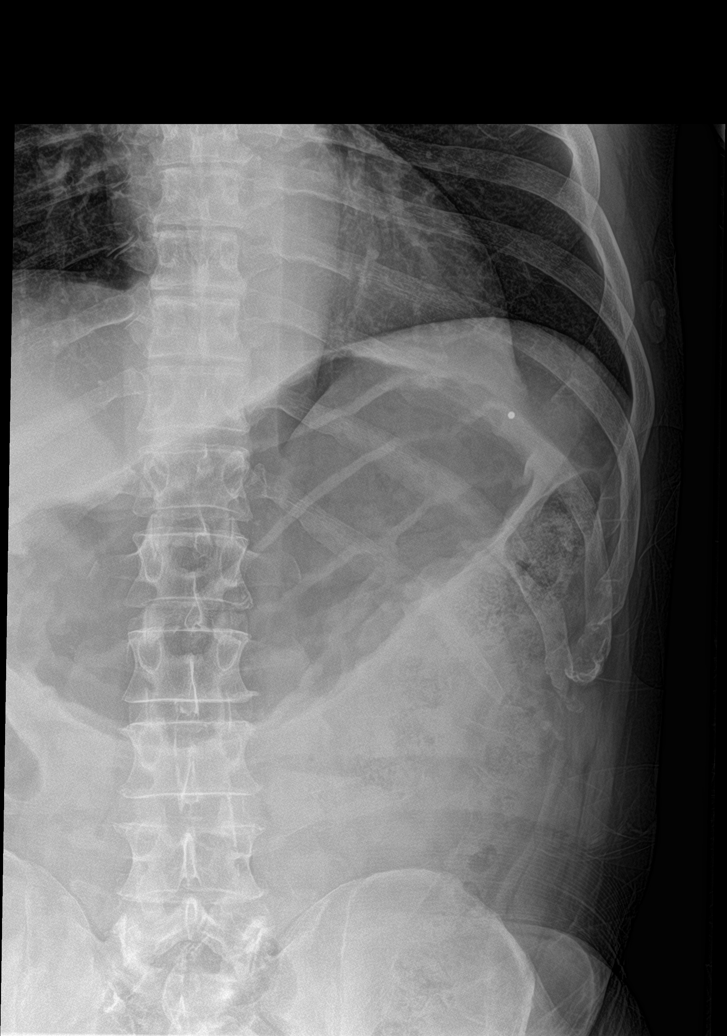

[rib ap obl (2 of 2)]
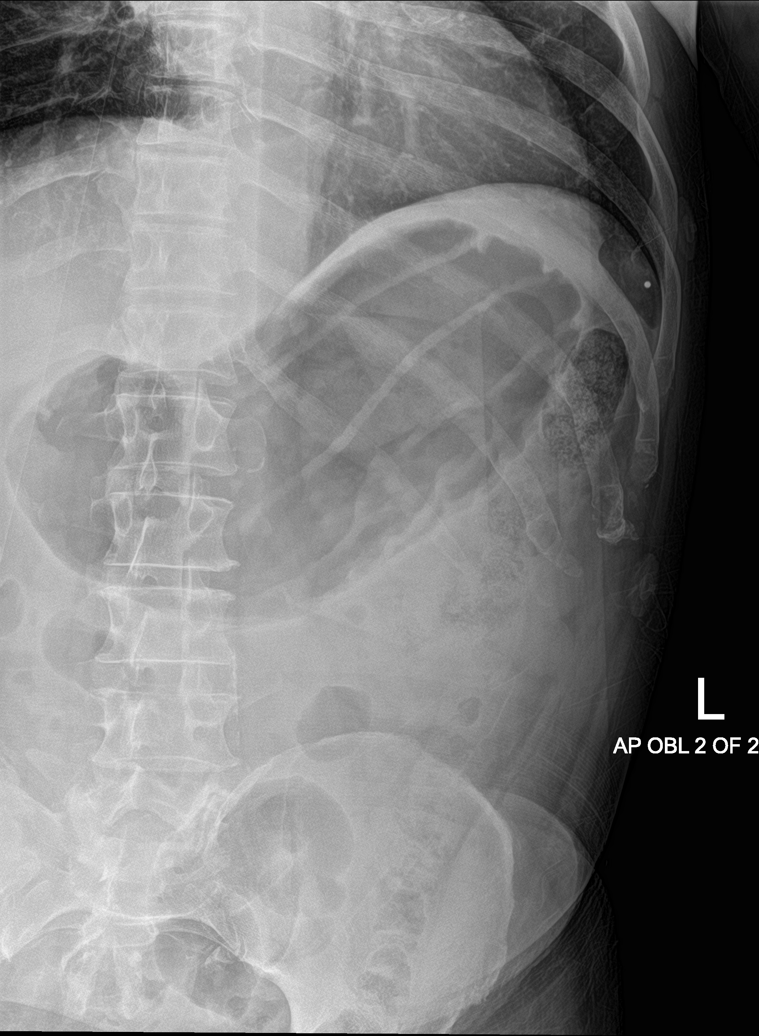

[5 of 5 positions shown; findings below may reference images not displayed]

FINDINGS: No visible rib fracture. Lungs clear. Heart is normal size. No
effusions or pneumothorax.
IMPRESSION: No visible rib fracture.

No acute cardiopulmonary disease.

## 2019-06-30 IMAGING — MR MR THORACIC SPINE W/O CM
2 of 17 series · 3 of 48 positions shown · non-contrast
Comparison: Brain MRI today reported separately.
COMPARISON: Brain MRI today reported separately.

Addendum:
CLINICAL DATA: 69-year-old male with unexplained right lower
extremity weakness, progressive since [REDACTED]. Recent fall and
subsequent new left leg weakness. Hyperreflexia and clonus on lower
extremity exam. Query stroke, myelitis, central cord compression.

EXAM:
MRI CERVICAL AND THORACIC SPINE WITHOUT CONTRAST
TECHNIQUE: Multiplanar and multiecho pulse sequences of the cervical spine, to
include the craniocervical junction and cervicothoracic junction,
and the thoracic spine, were obtained without intravenous contrast.

[Series 5: T2 · axial · 5.0mm · 0.23mm/px · z∈[-84,+65]mm · 2 of 26 slices shown (1 of 2)]
[im 1/26]
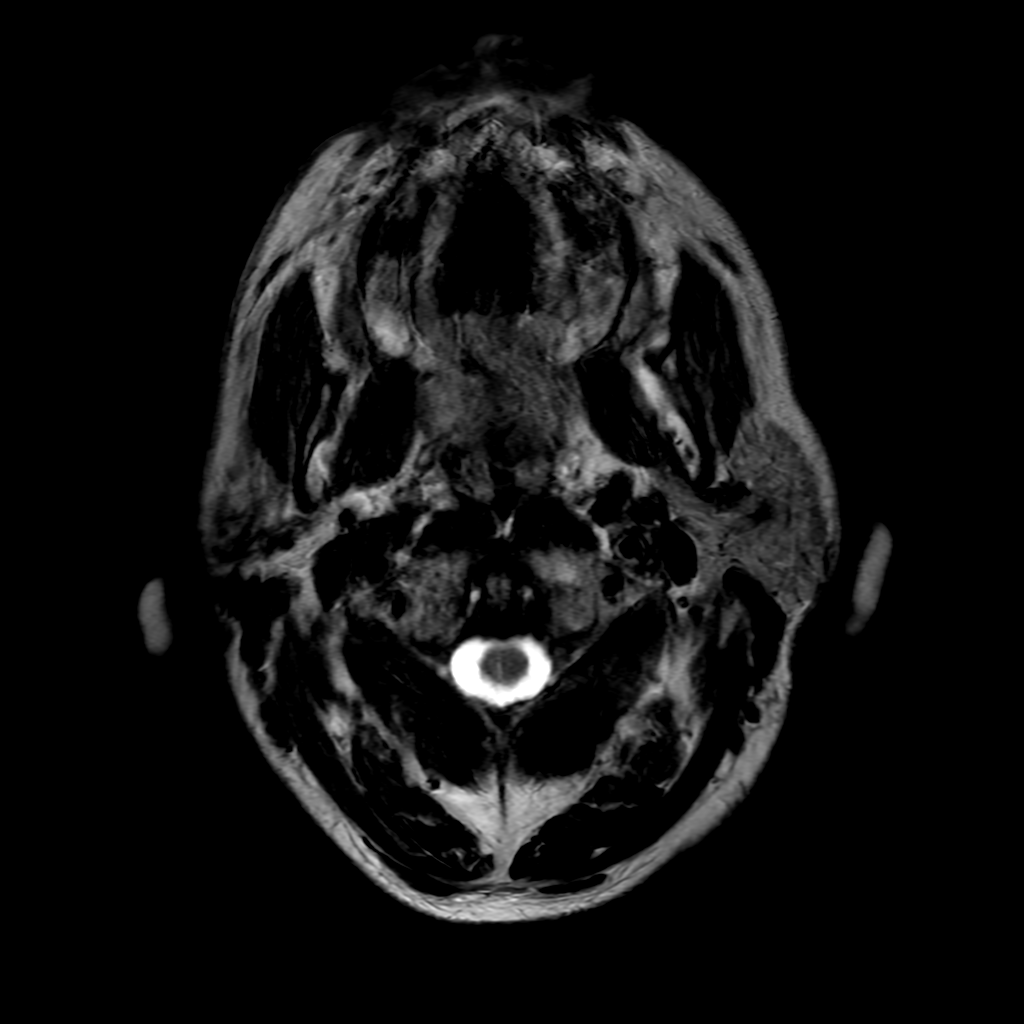
[im 26/26]
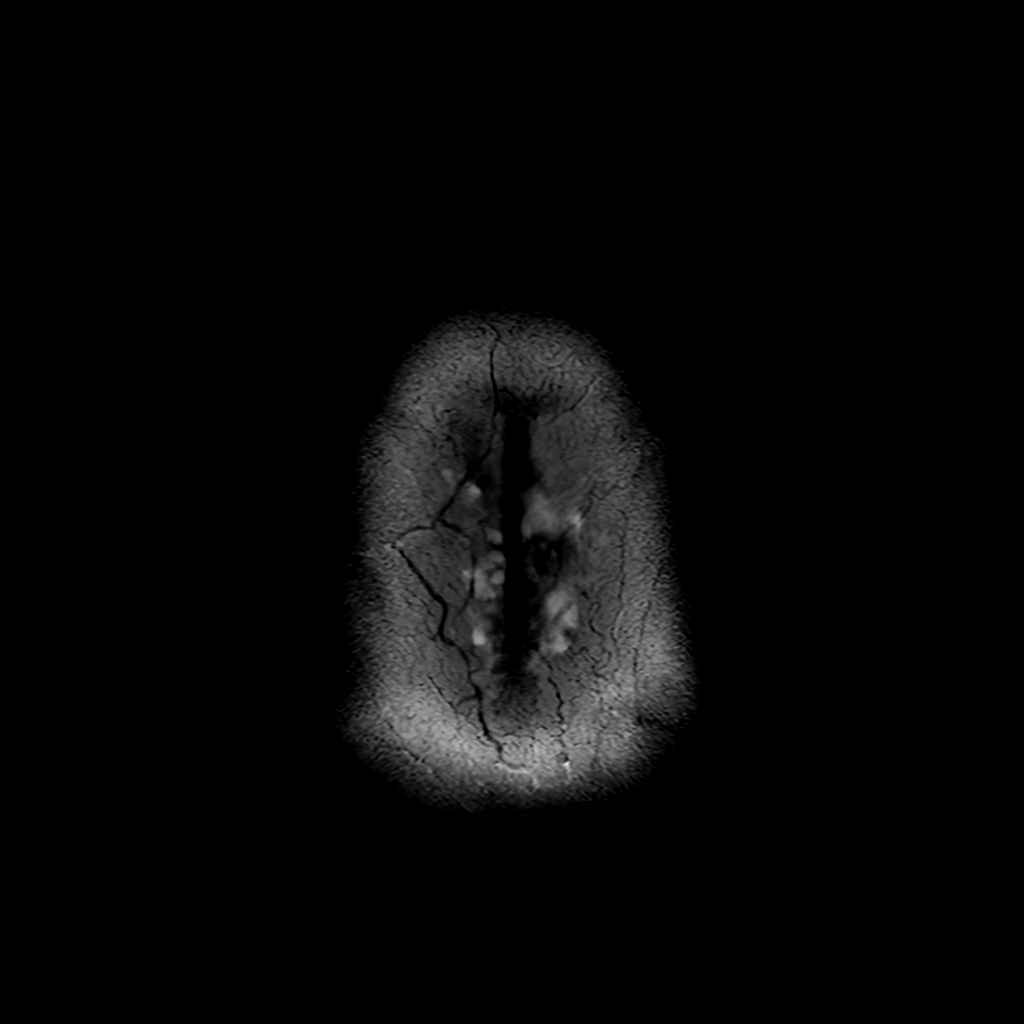

[Series 10: T2 · coronal · 5.0mm · 0.20mm/px · 1 of 27 slices shown (2 of 2)]
[im 1/27]
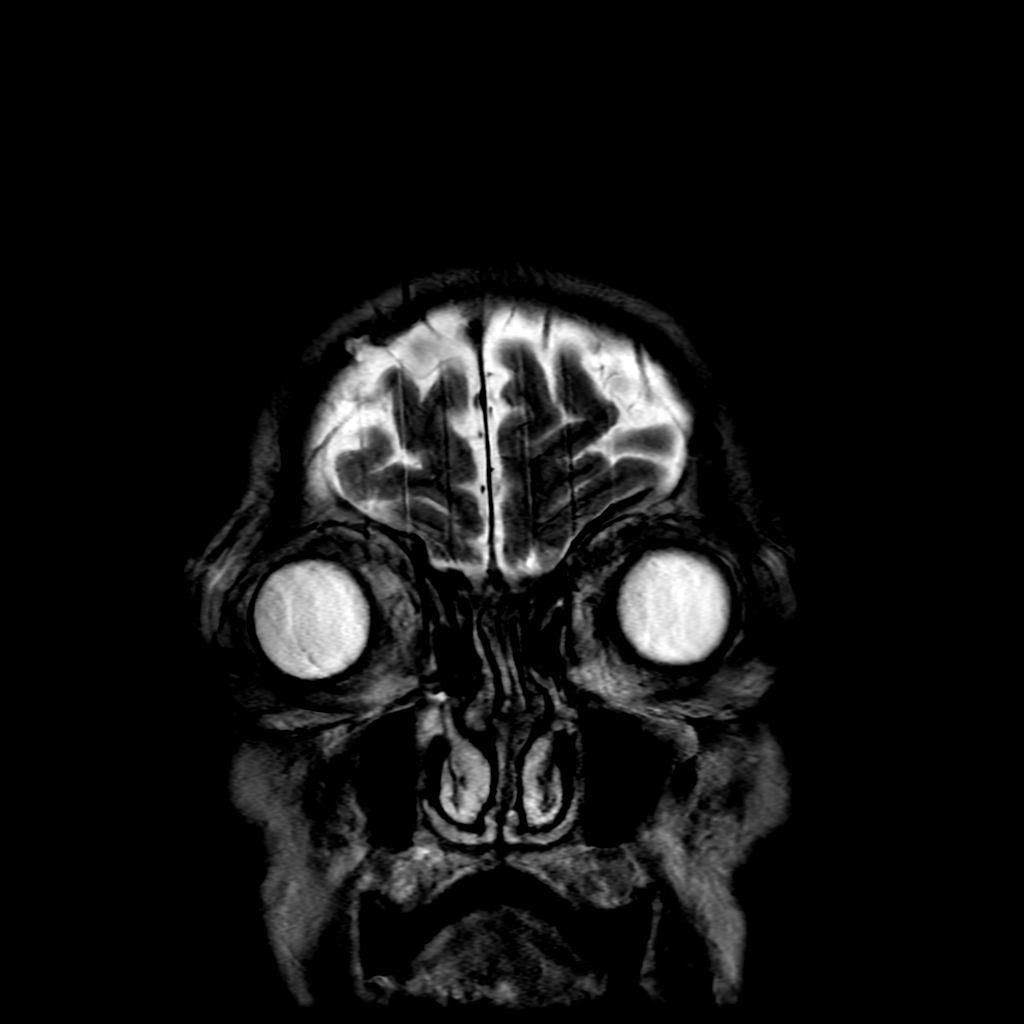

[3 of 48 positions shown; findings below may reference images not displayed]

FINDINGS: MRI CERVICAL SPINE FINDINGS

Alignment: Straightening of cervical lordosis. Subtle
anterolisthesis at C3-C4 and C7-T1.

Vertebrae: No marrow edema or evidence of acute osseous abnormality.
Visualized bone marrow signal is within normal limits.

Cord: Cervical spinal cord signal remains normal despite several
levels of degenerative spinal stenosis with mild cord mass effect
detailed below.

Posterior Fossa, vertebral arteries, paraspinal tissues:
Cervicomedullary junction is within normal limits. Brain findings
reported separately today. Preserved major vascular flow voids in
the neck. Negative visible neck soft tissues and right lung apex.

Disc levels:

Widespread cervical spine degeneration, most notable for:

C3-C4: Circumferential disc bulge and endplate spurring plus facet
hypertrophy. Borderline to mild spinal stenosis with moderate to
severe C4 foraminal stenosis greater on the left.

C4-C5: Disc space loss with circumferential disc osteophyte complex
and facet hypertrophy. Mild spinal stenosis with up to mild spinal
cord mass effect. Severe bilateral C5 foraminal stenosis.

C5-C6: Disc space loss with bulky circumferential disc osteophyte
complex. Mild facet but moderate ligament flavum hypertrophy. Spinal
stenosis with mild spinal cord mass effect. Severe bilateral C6
foraminal stenosis.

C6-C7: Disc space loss with circumferential disc osteophyte complex.
Bulky right paracentral posterior component of disc extrusion
(series 19, image 20). Mild posterior element hypertrophy. Mild
spinal stenosis and right hemi cord mass effect. Moderate left and
moderate to severe right C7 foraminal stenosis.

MRI THORACIC SPINE FINDINGS

Segmentation: Appears to be normal.

Alignment: Preserved thoracic kyphosis. No significant thoracic
scoliosis. There is subtle degenerative appearing anterolisthesis at
T9-T10.

Vertebrae: No marrow edema or evidence of acute osseous abnormality.
Probable benign vertebral body hemangioma centrally at T10 although
with decreased T1 signal. Elsewhere visible bone marrow signal is
within normal limits.

Cord: There is broad-based compression of the thoracic spinal cord
at the T6 level (series 20, image 12) which appears to be
degenerative in nature. There is associated abnormal T2 and STIR
hyperintense signal within the cord at the level of compression, as
well as just below the compressed level extending to mid T7 (also
image 12) which resembles myelomalacia on axial series 25, image 6.
No definite spinal cord hemorrhage. Above the level of compression
there is also a smaller focus of abnormal central to slightly left
paracentral cord signal superiorly at T5 best seen on series 24,
image 14. This is less specific and might be a small syrinx.

Below T7 thoracic spinal cord signal appears normal to the conus
which is at L1.

Paraspinal and other soft tissues: Negative visible thoracic and
upper abdominal viscera. Thoracic paraspinal soft tissues are within
normal limits.

Disc levels:

Widespread thoracic spine degeneration, but most notable for:

T2-T3: Multifactorial mild spinal stenosis related to disc bulging
and posterior element hypertrophy. Mild if any cord mass effect.
Moderate to severe bilateral T2 foraminal stenosis.

T6-T7: Severe loss of the disc space with what appears to be a bulky
extruded posterior disc fragment with mostly cephalad migration
(series 20 image 12 and series 21, image 12). Superimposed moderate
posterior element hypertrophy.

Subsequent severe spinal stenosis and cord compression (series 25,
image 4) with abnormal cord signal. At least moderate associated
bilateral T6 foraminal stenosis.

T7-T8 through T9-T10: Disc bulging, endplate spurring and up to
moderate degenerative facet arthropathy including trace degenerative
facet joint fluid at the latter 2 levels. Borderline to mild spinal
stenosis at these levels with no cord mass effect. Bilateral
foraminal stenosis which is moderate on the left at the T8 nerve
level.
IMPRESSION: 1. Severe degenerative spinal stenosis and Severe SPINAL CORD
COMPRESSION at T6-T7 related to bulky disc extrusion. Abnormal cord
signal likely due to a combination of cord
edema/contusion/myelomalacia in this clinical setting. There might
also be a small focus of secondary spinal cord syrinx at T5.

2. Other cervical and thoracic degenerative spinal stenosis with
only mild cord mass effect. No other cord signal abnormality.

3. No evidence of acute osseous abnormality or ligamentous injury in
the cervical or thoracic spine.

Neurology consultant was paged via amion regarding these findings at
[1V] hours. But I will also discuss this exam with the Emergency
Department provider and issue an addendum.

ADDENDUM:
Critical Value/emergent results were called by telephone at the time
of interpretation on [DATE] at [1V] hours to ED Dr. AUVA
AUVA who verbally acknowledged these results.

And he had already discussed the exam with Neurology at that time.

*** End of Addendum ***
FINDINGS: MRI CERVICAL SPINE FINDINGS

Alignment: Straightening of cervical lordosis. Subtle
anterolisthesis at C3-C4 and C7-T1.

Vertebrae: No marrow edema or evidence of acute osseous abnormality.
Visualized bone marrow signal is within normal limits.

Cord: Cervical spinal cord signal remains normal despite several
levels of degenerative spinal stenosis with mild cord mass effect
detailed below.

Posterior Fossa, vertebral arteries, paraspinal tissues:
Cervicomedullary junction is within normal limits. Brain findings
reported separately today. Preserved major vascular flow voids in
the neck. Negative visible neck soft tissues and right lung apex.

Disc levels:

Widespread cervical spine degeneration, most notable for:

C3-C4: Circumferential disc bulge and endplate spurring plus facet
hypertrophy. Borderline to mild spinal stenosis with moderate to
severe C4 foraminal stenosis greater on the left.

C4-C5: Disc space loss with circumferential disc osteophyte complex
and facet hypertrophy. Mild spinal stenosis with up to mild spinal
cord mass effect. Severe bilateral C5 foraminal stenosis.

C5-C6: Disc space loss with bulky circumferential disc osteophyte
complex. Mild facet but moderate ligament flavum hypertrophy. Spinal
stenosis with mild spinal cord mass effect. Severe bilateral C6
foraminal stenosis.

C6-C7: Disc space loss with circumferential disc osteophyte complex.
Bulky right paracentral posterior component of disc extrusion
(series 19, image 20). Mild posterior element hypertrophy. Mild
spinal stenosis and right hemi cord mass effect. Moderate left and
moderate to severe right C7 foraminal stenosis.

MRI THORACIC SPINE FINDINGS

Segmentation: Appears to be normal.

Alignment: Preserved thoracic kyphosis. No significant thoracic
scoliosis. There is subtle degenerative appearing anterolisthesis at
T9-T10.

Vertebrae: No marrow edema or evidence of acute osseous abnormality.
Probable benign vertebral body hemangioma centrally at T10 although
with decreased T1 signal. Elsewhere visible bone marrow signal is
within normal limits.

Cord: There is broad-based compression of the thoracic spinal cord
at the T6 level (series 20, image 12) which appears to be
degenerative in nature. There is associated abnormal T2 and STIR
hyperintense signal within the cord at the level of compression, as
well as just below the compressed level extending to mid T7 (also
image 12) which resembles myelomalacia on axial series 25, image 6.
No definite spinal cord hemorrhage. Above the level of compression
there is also a smaller focus of abnormal central to slightly left
paracentral cord signal superiorly at T5 best seen on series 24,
image 14. This is less specific and might be a small syrinx.

Below T7 thoracic spinal cord signal appears normal to the conus
which is at L1.

Paraspinal and other soft tissues: Negative visible thoracic and
upper abdominal viscera. Thoracic paraspinal soft tissues are within
normal limits.

Disc levels:

Widespread thoracic spine degeneration, but most notable for:

T2-T3: Multifactorial mild spinal stenosis related to disc bulging
and posterior element hypertrophy. Mild if any cord mass effect.
Moderate to severe bilateral T2 foraminal stenosis.

T6-T7: Severe loss of the disc space with what appears to be a bulky
extruded posterior disc fragment with mostly cephalad migration
(series 20 image 12 and series 21, image 12). Superimposed moderate
posterior element hypertrophy.

Subsequent severe spinal stenosis and cord compression (series 25,
image 4) with abnormal cord signal. At least moderate associated
bilateral T6 foraminal stenosis.

T7-T8 through T9-T10: Disc bulging, endplate spurring and up to
moderate degenerative facet arthropathy including trace degenerative
facet joint fluid at the latter 2 levels. Borderline to mild spinal
stenosis at these levels with no cord mass effect. Bilateral
foraminal stenosis which is moderate on the left at the T8 nerve
level.
IMPRESSION: 1. Severe degenerative spinal stenosis and Severe SPINAL CORD
COMPRESSION at T6-T7 related to bulky disc extrusion. Abnormal cord
signal likely due to a combination of cord
edema/contusion/myelomalacia in this clinical setting. There might
also be a small focus of secondary spinal cord syrinx at T5.

2. Other cervical and thoracic degenerative spinal stenosis with
only mild cord mass effect. No other cord signal abnormality.

3. No evidence of acute osseous abnormality or ligamentous injury in
the cervical or thoracic spine.

Neurology consultant was paged via amion regarding these findings at
[1V] hours. But I will also discuss this exam with the Emergency
Department provider and issue an addendum.

## 2019-06-30 IMAGING — DX DG PORTABLE PELVIS
1 series · 1 of 1 positions shown · non-contrast
Comparison: Thoracic MRI today reported separately.

CLINICAL DATA: 69-year-old male with unexplained right lower
extremity weakness, progressive since [REDACTED]. Recent fall and
subsequent new left leg weakness. Hyperreflexia and clonus on lower
extremity exam. Query stroke, myelitis, central cord compression.

EXAM:
PORTABLE PELVIS 1-2 VIEWS

[pelvis ap]
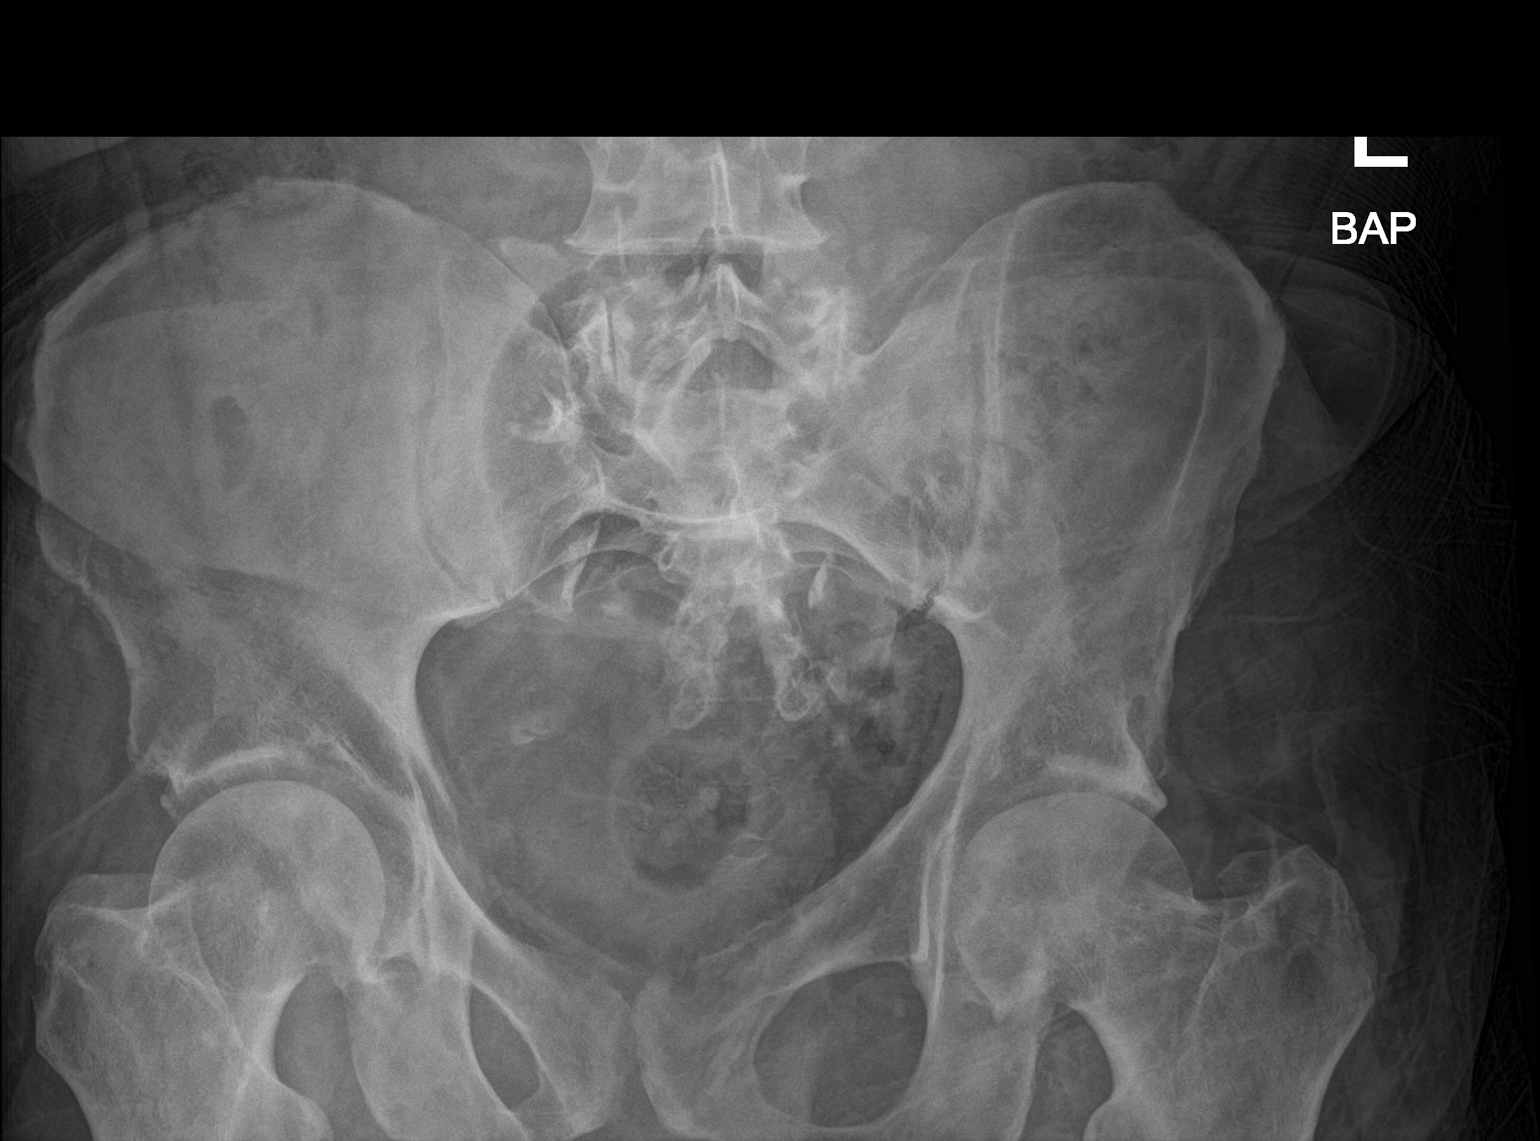

[1 of 1 positions shown; findings below may reference images not displayed]

FINDINGS: Portable AP supine view at [BY] hours. The pelvis is mildly rotated
to the right. Bone mineralization is within normal limits. Femoral
heads are normally located. The entire inferior pubic rami are not
included, but no pelvic fracture is identified. SI joints appear
intact. Grossly intact visible proximal femurs. Negative visible
lower abdominal and pelvic visceral contour.
IMPRESSION: No acute fracture or dislocation identified about the pelvis.

## 2019-06-30 IMAGING — MR MR HEAD W/O CM
10 of 24 series · 20 of 48 positions shown · non-contrast
Comparison: Head CT without contrast [DATE].

CLINICAL DATA: 69-year-old male with unexplained right lower
extremity weakness, progressive since [REDACTED]. Recent fall and
subsequent new left leg weakness. Hyperreflexia and clonus on lower
extremity exam. Query stroke, myelitis, central cord compression.

EXAM:
MRI HEAD WITHOUT CONTRAST
TECHNIQUE: Multiplanar, multiecho pulse sequences of the brain and surrounding
structures were obtained without intravenous contrast.

[Series 2: DWI · axial · 3.0mm · 0.94mm/px · z∈[-81,+75]mm · 5 of 106 slices shown (1 of 2)]
[im 1/106]
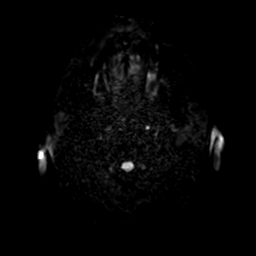
[im 27/106]
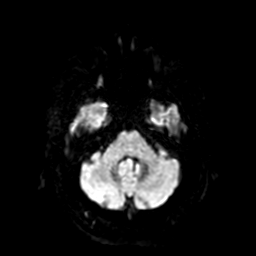
[im 53/106]
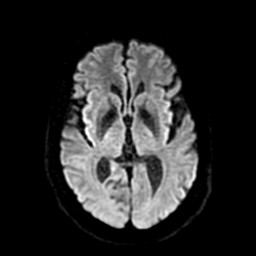
[im 79/106]
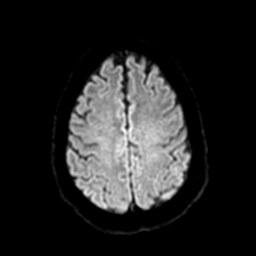
[im 106/106]
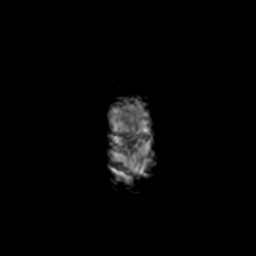

[Series 3: DWI · coronal · 4.0mm · 0.94mm/px · 4 of 84 slices shown (2 of 2)]
[im 1/84]
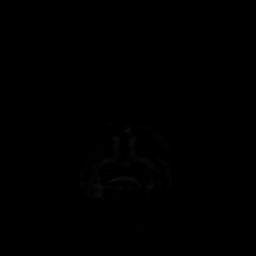
[im 28/84]
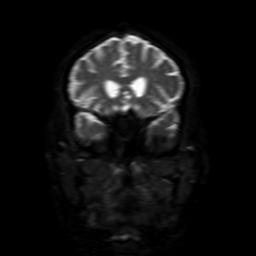
[im 56/84]
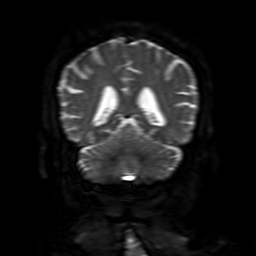
[im 84/84]
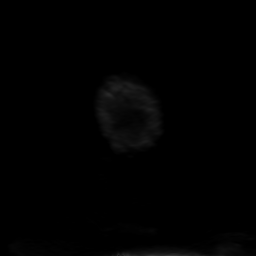

[Series 4: FLAIR · sagittal · 5.0mm · 0.23mm/px · 1 of 23 slices shown (1 of 3)]
[im 1/23]
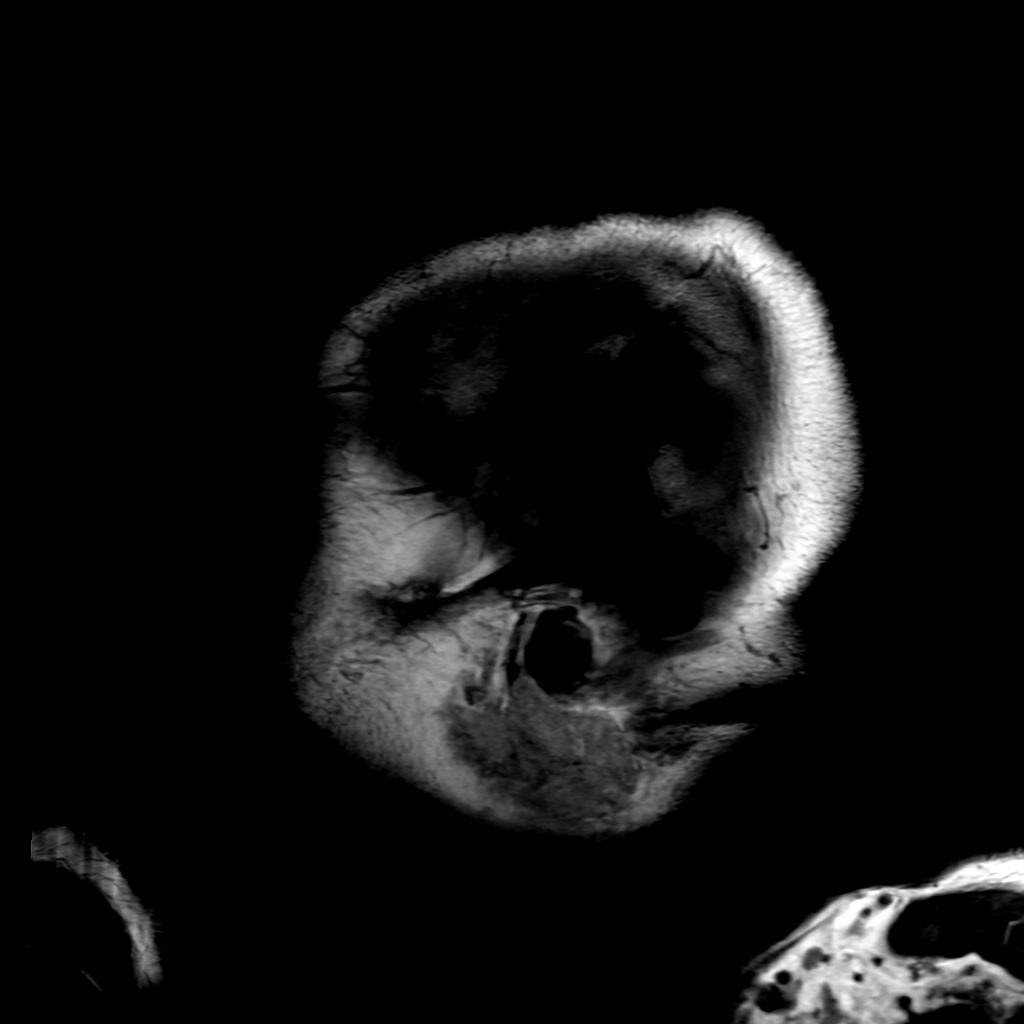

[Series 5: T2 · axial · 5.0mm · 0.23mm/px · 1 of 26 slices shown (1 of 3)]
[im 1/26]
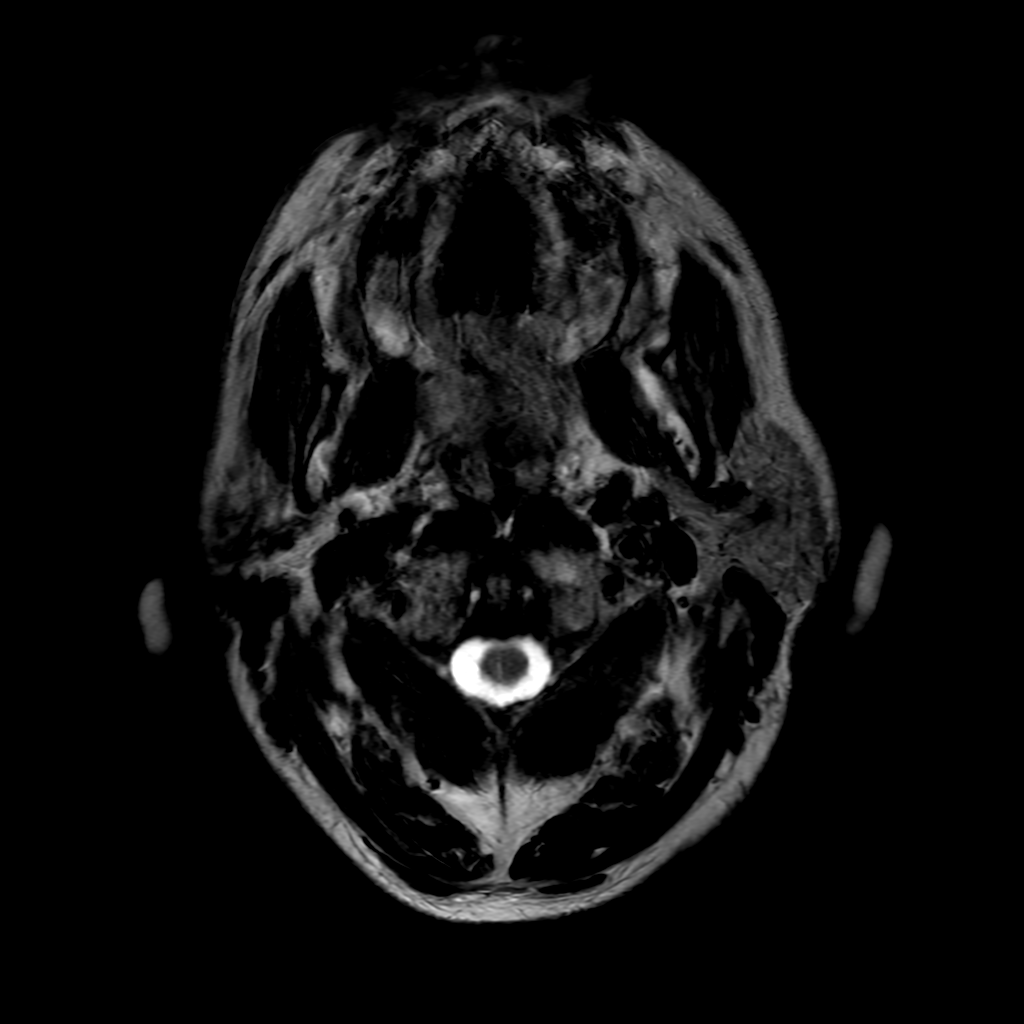

[Series 6: FLAIR · axial · 3.0mm · 0.47mm/px · 1 of 26 slices shown (2 of 3)]
[im 1/26]
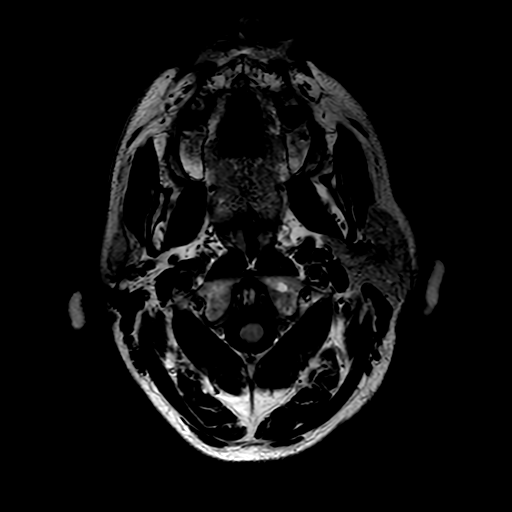

[Series 10: T2 · coronal · 5.0mm · 0.20mm/px · 1 of 27 slices shown (2 of 3)]
[im 1/27]
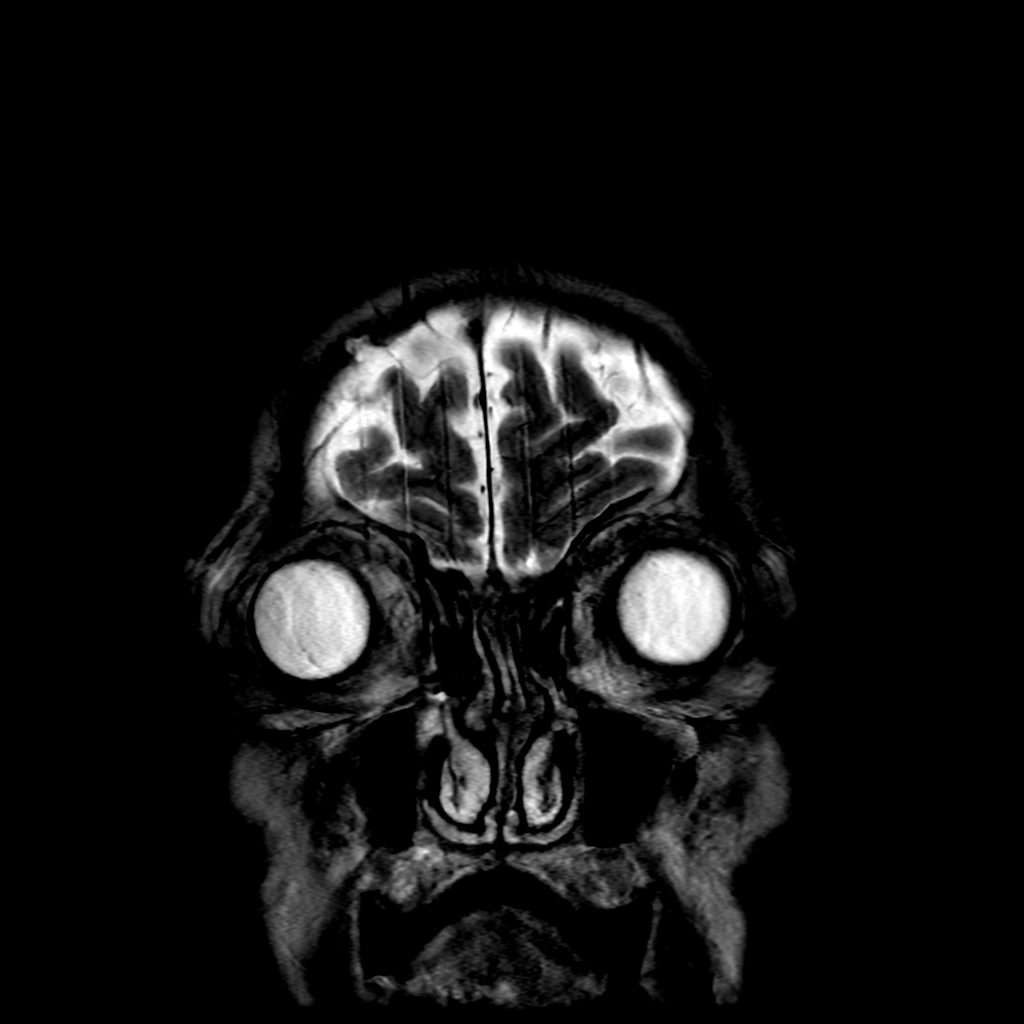

[Series 12: FLAIR · sagittal · 3.0mm · 0.43mm/px · 1 of 18 slices shown (3 of 3)]
[im 1/18]
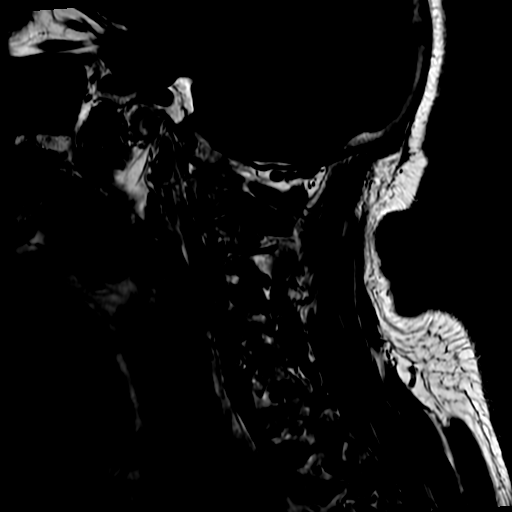

[Series 16: T2 · sagittal · 3.0mm · 0.43mm/px · 1 of 18 slices shown (3 of 3)]
[im 1/18]
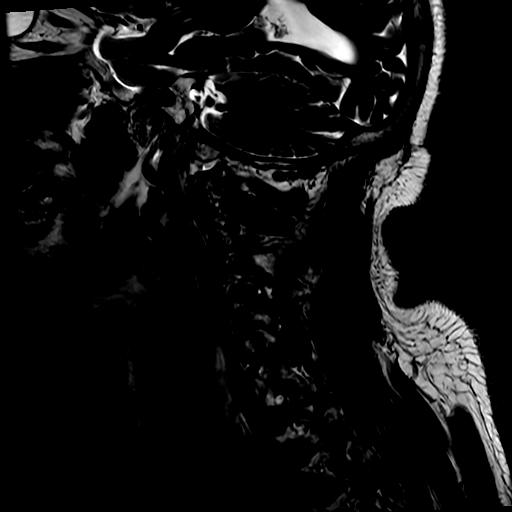

[Series 250: ADC · axial · 3.0mm · 0.94mm/px · z∈[-81,+75]mm · 3 of 52 slices shown (1 of 2)]
[im 1/52]
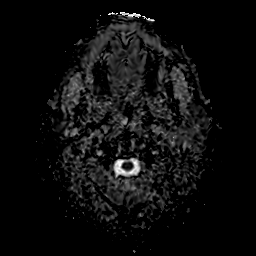
[im 26/52]
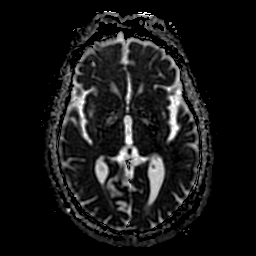
[im 52/52]
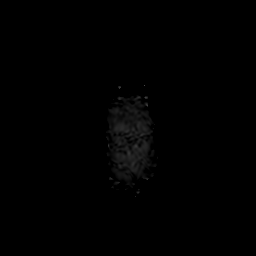

[Series 350: ADC · coronal · 4.0mm · 0.94mm/px · 2 of 42 slices shown (2 of 2)]
[im 1/42]
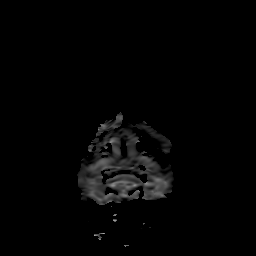
[im 42/42]
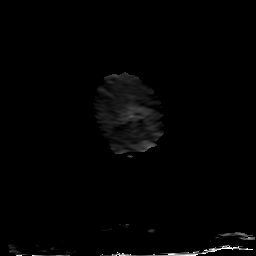

[20 of 48 positions shown; findings below may reference images not displayed]

FINDINGS: Brain: No restricted diffusion to suggest acute infarction. No
midline shift, mass effect, evidence of mass lesion,
ventriculomegaly, extra-axial collection or acute intracranial
hemorrhage. Cervicomedullary junction and pituitary are within
normal limits.

No cortical encephalomalacia. No chronic cerebral blood products.
Minimal to mild for age nonspecific scattered cerebral white matter
T2 and FLAIR hyperintensity. Similar mild nonspecific T2 and FLAIR
hyperintensity in the pons, and also the globus pallidus. The other
deep gray nuclei in the cerebellum appear normal.

Vascular: Major intracranial vascular flow voids are preserved.
There is mild generalized intracranial artery tortuosity.

Skull and upper cervical spine: Cervical spine details reported
separately. Visualized bone marrow signal is within normal limits.

Sinuses/Orbits: Negative orbits.  Paranasal sinuses are clear.

Other: Mild mastoid effusions, mostly on the right. Visible
nasopharynx appears within normal limits.

There is a 12 mm round soft tissue nodule located along the inferior
margin of the left parotid gland with possible hypercellularity on
DWI (series 4, image 4 and series 3, image 26). However, there is a
nearby slightly smaller nodule with similar MRI signal (series 4,
image 3 and series 3, image 27). Other visible face and scalp soft
tissues appear negative.
IMPRESSION: 1. No acute intracranial abnormality. Mild for age nonspecific
signal changes in the cerebral white matter and pons.

2. Two small soft tissue nodules in the inferior right parotid gland
are compatible with small salivary neoplasms, but multiplicity
favors benign etiology such as Warthin's tumor.

3. Cervical and Thoracic MRI reported separately today.

## 2019-06-30 MED ORDER — ONDANSETRON HCL 4 MG/2ML IJ SOLN
4.0000 mg | Freq: Once | INTRAMUSCULAR | Status: AC | PRN
  Administered 2019-06-30: 4 mg via INTRAVENOUS
  Filled 2019-06-30: qty 2

## 2019-06-30 MED ORDER — INSULIN ASPART 100 UNIT/ML ~~LOC~~ SOLN
0.0000 [IU] | Freq: Three times a day (TID) | SUBCUTANEOUS | Status: DC
  Administered 2019-07-01: 2 [IU] via SUBCUTANEOUS
  Administered 2019-07-02: 5 [IU] via SUBCUTANEOUS
  Administered 2019-07-02 (×2): 2 [IU] via SUBCUTANEOUS
  Administered 2019-07-03 – 2019-07-05 (×7): 3 [IU] via SUBCUTANEOUS
  Administered 2019-07-05 – 2019-07-06 (×2): 5 [IU] via SUBCUTANEOUS
  Administered 2019-07-06 (×2): 3 [IU] via SUBCUTANEOUS
  Administered 2019-07-07 (×2): 5 [IU] via SUBCUTANEOUS

## 2019-06-30 MED ORDER — SODIUM CHLORIDE 0.9 % IV SOLN: 250.0000 mL | INTRAVENOUS | Status: DC | PRN

## 2019-06-30 MED ORDER — SODIUM CHLORIDE 0.9% FLUSH: 3.0000 mL | INTRAVENOUS | Status: DC | PRN

## 2019-06-30 MED ORDER — MORPHINE SULFATE (PF) 4 MG/ML IV SOLN
4.0000 mg | Freq: Once | INTRAVENOUS | Status: AC
  Administered 2019-06-30: 4 mg via INTRAVENOUS
  Filled 2019-06-30: qty 1

## 2019-06-30 MED ORDER — GLIPIZIDE 5 MG PO TABS
5.0000 mg | ORAL_TABLET | Freq: Every day | ORAL | Status: DC
  Administered 2019-07-01 – 2019-07-07 (×6): 5 mg via ORAL
  Filled 2019-06-30 (×7): qty 1

## 2019-06-30 MED ORDER — SODIUM CHLORIDE 0.9% FLUSH
3.0000 mL | Freq: Two times a day (BID) | INTRAVENOUS | Status: DC
  Administered 2019-06-30: 3 mL via INTRAVENOUS

## 2019-06-30 MED ORDER — POLYETHYLENE GLYCOL 3350 17 G PO PACK: 17.0000 g | PACK | Freq: Every day | ORAL | Status: DC | PRN

## 2019-06-30 MED ORDER — ONDANSETRON HCL 4 MG/2ML IJ SOLN: 4.0000 mg | Freq: Four times a day (QID) | INTRAMUSCULAR | Status: DC | PRN

## 2019-06-30 MED ORDER — CEFAZOLIN SODIUM-DEXTROSE 2-4 GM/100ML-% IV SOLN
2.0000 g | INTRAVENOUS | Status: AC
  Administered 2019-07-01: 2 g via INTRAVENOUS
  Filled 2019-06-30: qty 100

## 2019-06-30 MED ORDER — OXYCODONE HCL 5 MG PO TABS: 5.0000 mg | ORAL_TABLET | ORAL | Status: DC | PRN

## 2019-06-30 MED ORDER — ZOLPIDEM TARTRATE 5 MG PO TABS: 5.0000 mg | ORAL_TABLET | Freq: Every evening | ORAL | Status: DC | PRN

## 2019-06-30 MED ORDER — DOCUSATE SODIUM 100 MG PO CAPS
100.0000 mg | ORAL_CAPSULE | Freq: Two times a day (BID) | ORAL | Status: DC
  Administered 2019-06-30 – 2019-07-01 (×2): 100 mg via ORAL
  Filled 2019-06-30 (×2): qty 1

## 2019-06-30 MED ORDER — AMLODIPINE BESYLATE 10 MG PO TABS
10.0000 mg | ORAL_TABLET | Freq: Every day | ORAL | Status: DC
  Administered 2019-07-01 – 2019-07-07 (×7): 10 mg via ORAL
  Filled 2019-06-30 (×3): qty 1
  Filled 2019-06-30: qty 2
  Filled 2019-06-30 (×4): qty 1

## 2019-06-30 MED ORDER — INSULIN ASPART 100 UNIT/ML ~~LOC~~ SOLN
0.0000 [IU] | Freq: Every day | SUBCUTANEOUS | Status: DC
  Administered 2019-07-05: 3 [IU] via SUBCUTANEOUS

## 2019-06-30 MED ORDER — LOSARTAN POTASSIUM 50 MG PO TABS
100.0000 mg | ORAL_TABLET | Freq: Every day | ORAL | Status: DC
  Administered 2019-07-01 – 2019-07-07 (×7): 100 mg via ORAL
  Filled 2019-06-30 (×8): qty 2

## 2019-06-30 MED ORDER — HYDROCODONE-ACETAMINOPHEN 5-325 MG PO TABS: 1.0000 | ORAL_TABLET | ORAL | Status: DC | PRN

## 2019-06-30 MED ORDER — FENTANYL CITRATE (PF) 100 MCG/2ML IJ SOLN
50.0000 ug | Freq: Once | INTRAMUSCULAR | Status: AC | PRN
  Administered 2019-06-30: 50 ug via INTRAVENOUS
  Filled 2019-06-30: qty 2

## 2019-06-30 MED ORDER — ONDANSETRON HCL 4 MG PO TABS: 4.0000 mg | ORAL_TABLET | Freq: Four times a day (QID) | ORAL | Status: DC | PRN

## 2019-06-30 MED ORDER — PRAVASTATIN SODIUM 10 MG PO TABS
20.0000 mg | ORAL_TABLET | Freq: Every day | ORAL | Status: DC
  Administered 2019-06-30 – 2019-07-07 (×7): 20 mg via ORAL
  Filled 2019-06-30 (×8): qty 2

## 2019-06-30 MED ORDER — SODIUM CHLORIDE 0.9 % IV SOLN: INTRAVENOUS | Status: DC

## 2019-06-30 MED ORDER — BISACODYL 10 MG RE SUPP: 10.0000 mg | Freq: Every day | RECTAL | Status: DC | PRN

## 2019-06-30 MED ORDER — MORPHINE SULFATE (PF) 2 MG/ML IV SOLN
2.0000 mg | INTRAVENOUS | Status: DC | PRN
  Administered 2019-07-01: 2 mg via INTRAVENOUS
  Filled 2019-06-30: qty 1

## 2019-06-30 MED ORDER — HYDRALAZINE HCL 10 MG PO TABS
10.0000 mg | ORAL_TABLET | Freq: Two times a day (BID) | ORAL | Status: DC
  Administered 2019-06-30 – 2019-07-07 (×12): 10 mg via ORAL
  Filled 2019-06-30 (×13): qty 1

## 2019-06-30 MED ORDER — ACETAMINOPHEN 325 MG PO TABS: 650.0000 mg | ORAL_TABLET | Freq: Four times a day (QID) | ORAL | Status: DC | PRN

## 2019-06-30 MED ORDER — ACETAMINOPHEN 650 MG RE SUPP: 650.0000 mg | Freq: Four times a day (QID) | RECTAL | Status: DC | PRN

## 2019-06-30 NOTE — Consult Note (Addendum)
Neurology Consultation  Reason for Consult: Fall with lower extremity weakness Referring Physician: Ronnald Nian  CC: Lower extremity weakness  History is obtained from: Patient  HPI: OLEE DICLEMENTE is a 69 y.o. male with history of right leg weakness, diabetic neuropathy and diabetes.  Neurology was asked to see patient to help find etiology of patient's lower extremity weakness.  Patient states that since November of last year he has had progressive weakness of his right leg.  He has seen his primary care doctor attributed it to his diabetes in addition to being exposed to agent orange.  Patient states that it is progressively gotten to the point where he needs to use a walker and cannot use that right leg thus depending on the left leg.  This morning patient got up at approximate 11 AM, went to get out of bed, and tripped over what he believes is a shoe causing him to fall on his walker.  Since the fall he is noted significant weakness in his left leg to the point where he cannot lift it up.  He states prior to that fall he was able to lift his leg off of the bed without any difficulty and bear weight on that leg.  Patient denies any back pain, shock like feeling going down his neck into his lower spine, numbness in either leg that is new, has no difficulty with bowel or bladder.   Past Medical History:  Diagnosis Date  . Diabetes mellitus without complication ([place])   . Diabetic neuropathy ([facility])     Family History  Problem Relation Age of Onset  . Hypertension Mother   . Hypertension Father    Social History:   has no history on file for tobacco, alcohol, and drug.  Medications No current facility-administered medications for this encounter.  Current Outpatient Medications:  .  amLODipine (NORVASC) 10 MG tablet, Take 10 mg by mouth daily., Disp: , Rfl:  .  aspirin 81 MG chewable tablet, Chew 81 mg by mouth every morning., Disp: , Rfl:  .  glipiZIDE (GLUCOTROL) 5 MG tablet, Take  5-10 mg by mouth See admin instructions. Take one tablet (5mg ) in the AM and 2 tablets (10mg ) in the evening, Disp: , Rfl:  .  hydrALAZINE (APRESOLINE) 10 MG tablet, Take 10 mg by mouth 2 (two) times daily., Disp: , Rfl:  .  losartan (COZAAR) 100 MG tablet, Take 100 mg by mouth daily., Disp: , Rfl:  .  pravastatin (PRAVACHOL) 20 MG tablet, Take 20 mg by mouth daily., Disp: , Rfl:  .  sildenafil (VIAGRA) 100 MG tablet, Take 50 mg by mouth as directed. Not to exceed 1 dose per 24 hours, Disp: , Rfl:   ROS:   General ROS: negative for - chills, fatigue, fever, night sweats, weight gain or weight loss Psychological ROS: negative for - behavioral disorder, hallucinations, memory difficulties, mood swings or suicidal ideation Ophthalmic ROS: negative for - blurry vision, double vision, eye pain or loss of vision ENT ROS: negative for - epistaxis, nasal discharge, oral lesions, sore throat, tinnitus or vertigo Respiratory ROS: negative for - cough, hemoptysis, shortness of breath or wheezing Cardiovascular ROS: negative for - chest pain, dyspnea on exertion, edema or irregular heartbeat Gastrointestinal ROS: negative for - abdominal pain, diarrhea, hematemesis, nausea/vomiting or stool incontinence Genito-Urinary ROS: negative for - dysuria, hematuria, incontinence or urinary frequency/urgency Musculoskeletal ROS: Positive for -  muscular weakness Neurological ROS: as noted in HPI Dermatological ROS: negative for rash and skin lesion  changes  Exam: Current vital signs: BP 137/77 (BP Location: Left Arm)   Pulse 86   Temp 98 F (36.7 C) (Oral)   Resp 15   Ht 5\' 11"  (1.803 m)   Wt 79.8 kg   SpO2 100%   BMI 24.55 kg/m  Vital signs in last 24 hours: Temp:  [98 F (36.7 C)] 98 F (36.7 C) (05/11 1142) Pulse Rate:  [57-93] 86 (05/11 1300) Resp:  [13-18] 15 (05/11 1300) BP: (137)/(77) 137/77 (05/11 1142) SpO2:  [100 %] 100 % (05/11 1300) Weight:  [79.8 kg] 79.8 kg (05/11  1158)   Constitutional: Appears well-developed and well-nourished.  Psych: Affect appropriate to situation Eyes: No scleral injection HENT: No OP obstrucion Head: Normocephalic.  Cardiovascular: Normal rate and regular rhythm.  Respiratory: Effort normal, non-labored breathing GI: Soft.  No distension. There is no tenderness.  Skin: WDI  Neuro: Mental Status: Patient is awake, alert, oriented to person, place, month, year, and situation.  No dysarthria, aphasia.  Able to follow commands without difficulty.Patient is able to give a clear and coherent history. Cranial Nerves: II: Visual Fields are full.  III,IV, VI: EOMI without ptosis or diploplia. Pupils equal, round and reactive to light V: Facial sensation is symmetric to temperature VII: Facial movement is symmetric.  VIII: hearing is intact to voice X: Palat elevates symmetrically XI: Shoulder shrug is symmetric. XII: tongue is midline without atrophy or fasciculations.  Motor: Bilateral upper extremities 5/5, bilateral quadriceps 1/5, left dorsiflexion 4/5 right dorsiflexion 1/5.  Adduction of legs 4/5, abduction of legs 2/5.  With any attempt of moving his right leg patient has sustained clonus.  Noted quadricep atrophy bilateral legs. Sensory: Sensation is symmetric to light touch and temperature in the arms and legs.  Decreased temperature, light touch, pinprick up to mid calf  Deep Tendon Reflexes: 2+ symmetrical deep tendon reflexes upper extremities.  Bilateral knee jerk 3+ with cross adduction and sustained clonus on the right leg.  Bilateral ankle jerks 2+ again resulting in sustained clonus on the right leg.  Ankle jerk shows sustained clonus on the right leg. Plantars: Toes are downgoing bilaterally.  Cerebellar: FNF within normal limits unable to do heel-to-shin.  Labs I have reviewed labs in epic and the results pertinent to this consultation are:   CBC    Component Value Date/Time   WBC 4.9 03/03/2018 0929    RBC 5.93 (H) 03/03/2018 0929   HGB 16.0 03/03/2018 0929   HCT 49.4 03/03/2018 0929   PLT 170 03/03/2018 0929   MCV 83.3 03/03/2018 0929   MCH 27.0 03/03/2018 0929   MCHC 32.4 03/03/2018 0929   RDW 15.7 (H) 03/03/2018 0929   LYMPHSABS 0.6 (L) 03/03/2018 0929   MONOABS 0.5 03/03/2018 0929   EOSABS 0.0 03/03/2018 0929   BASOSABS 0.0 03/03/2018 0929    CMP     Component Value Date/Time   NA 136 03/03/2018 0929   K 3.7 03/03/2018 0929   CL 102 03/03/2018 0929   CO2 24 03/03/2018 0929   GLUCOSE 42 (LL) 03/03/2018 0929   BUN 17 03/03/2018 0929   CREATININE 1.49 (H) 03/03/2018 0929   CALCIUM 10.1 03/03/2018 0929   PROT 8.2 (H) 03/03/2018 0929   ALBUMIN 3.9 03/03/2018 0929   AST 47 (H) 03/03/2018 0929   ALT 28 03/03/2018 0929   ALKPHOS 50 03/03/2018 0929   BILITOT 0.6 03/03/2018 0929   GFRNONAA 47 (L) 03/03/2018 0929   GFRAA 55 (L) 03/03/2018 [id number]  Imaging I have reviewed the images obtained:  MRI brain, cervical, thoracic spine pending  Assessment:  This is a 69 year old male with known right leg weakness which is progressed since November however, this morning when patient went to get out of bed he tripped over what he believes is a shoe and fell on his walker.  At this point time he is unable to lift and move his left leg as baseline.  Exam shows hyperreflexia bilateral knee jerks, with sustained clonus at the right leg and ankle which is baseline.  At this time differential diagnosis includes possible ACA stroke, myelitis, herniated disc involving central cord.  Impression: -Bilateral lower extremity weakness right greater than left -New weakness of left leg  Recommendations: -MRI of brain, cervical, thoracic spine. -Will need PT/OT -We will make further recommendations after MRI scans.  Etta Quill PA-C [facility] [phone]  M-F  (9:00 am- 5:00 PM)  06/30/2019, 2:02 PM   I have seen the patient and reviewed the above note.  He has  hyperreflexia bilaterally with severe proximal greater than distal weakness.  We recommended an MRI which is complete at the time of my finalizing this note, which reveals cord compression in the thoracic cord.  I suspect that he had chronic narrowing there with some contusion during his fall this morning given that he reports that he does have weakness prior to the fall.  He will need neurosurgical consultation for further evaluation and management of this issue.  Please call if neurology can be of any further assistance.  Roland Rack, MD [facility] [phone]  If 7pm- 7am, please page neurology on call as listed in [place].

## 2019-06-30 NOTE — ED Notes (Signed)
NSGY at bedside

## 2019-06-30 NOTE — ED Provider Notes (Signed)
Medical screening examination/treatment/procedure(s) were conducted as a shared visit with non-physician practitioner(s) and myself.  I personally evaluated the patient during the encounter. Briefly, the patient is a 69 y.o. male is 69 year old male with history of diabetes who presents to the ED after a fall.  Patient with normal vitals.  No fever.  Was walking with his walker and he had a fall landed on his left side.  He has some chronic right lower leg weakness ongoing for the last half a year or so.  He states after the fall he has had left-sided pain.  But he also states that he has left leg weakness.  He does not have any back pain or hip pain.  He states that he noticed that his left leg started to go week about last week but a much different weakness after this fall.  He is unable to lift his leg up off of the bed.  He is about 1+ out of 5 in the left lower extremity.  He is probably about 0 to 1+ out of 5 in the right lower extremity.  Sensation is intact throughout.  Otherwise neurological exam is normal.  He has hyperreflexia bilaterally.  He has clonus in the right ankle.  No clonus in the left ankle.  Overall he appears to have acute left lower leg weakness.  Neurology was consulted.  They came down to the ED to evaluate the patient and they recommend MRI of the brain, thoracic, cervical spine.  He does not appear to have any symptoms consistent with cauda equina.  Chest x-ray showed no rib fracture.  Patient otherwise with unremarkable lab work.  Awaiting MRI results.  Dispo per MRI and neurology recommendations.  Case management has been consulted to help with possible home needs.  This chart was dictated using voice recognition software.  Despite best efforts to proofread,  errors can occur which can change the documentation meaning.     EKG Interpretation None           Lennice Sites, DO 06/30/19 1427

## 2019-06-30 NOTE — ED Provider Notes (Signed)
Care transferred from [place], PA-C at shift change. See note for full HPI.  In summation, 69 year old male presents for evaluation after mechanical fall.  Denies any his head, LOC or anticoagulation.  Pain in his left ribs.  No shortness of breath or chest pain.  Has had right lower extremity weakness x1 year, new onset LLE weakness.  No pain to the lower area.  Unable to lift leg off the bed.  Station intact.  Hyperreflexia bilaterally.  Clonus at right ankle.  No clonus and limp.  Neurology was consulted. They have evaluated patient in the ED.  They recommended MRI of brain, thoracic and cervical spine.  Symptoms do not seem consistent with cauda equina.  Spoke per MRI neurology recommendations.  Case management has been consulted to help with home health needs.  May need PT, OT consult Physical Exam  BP 100/71   Pulse 66   Temp 98 F (36.7 C) (Oral)   Resp 16   Ht 5\' 11"  (1.803 m)   Wt 79.8 kg   SpO2 96%   BMI 24.55 kg/m   Physical Exam Vitals and nursing note reviewed.  Constitutional:      General: He is not in acute distress.    Appearance: He is well-developed. He is not ill-appearing, toxic-appearing or diaphoretic.  HENT:     Head: Normocephalic and atraumatic.     Nose: Nose normal.     Mouth/Throat:     Mouth: Mucous membranes are moist.  Eyes:     Pupils: Pupils are equal, round, and reactive to light.  Cardiovascular:     Rate and Rhythm: Normal rate and regular rhythm.     Pulses: Normal pulses.     Heart sounds: Normal heart sounds.  Pulmonary:     Effort: Pulmonary effort is normal. No respiratory distress.     Breath sounds: Normal breath sounds.  Abdominal:     General: Bowel sounds are normal. There is no distension.     Palpations: Abdomen is soft.     Tenderness: There is no abdominal tenderness. There is no guarding.  Musculoskeletal:     Cervical back: Normal range of motion and neck supple.     Comments: Unable to move right lower extremity.  Wiggles  toes on left lower extremity however 1/5 strength left lower extremity, 0/5 strength to right lower extremity.  Skin:    General: Skin is warm and dry.  Neurological:     Mental Status: He is alert and oriented to person, place, and time.     Sensory: Sensory deficit present.     Motor: Weakness present.     Coordination: Coordination abnormal.     Comments: Unable to ambulate     ED Course/Procedures     .Critical Care Performed by: Nettie Elm, PA-C Authorized by: Nettie Elm, PA-C   Critical care provider statement:    Critical care time (minutes):  45   Critical care was necessary to treat or prevent imminent or life-threatening deterioration of the following conditions: Severe spinal cord compression.   Critical care was time spent personally by me on the following activities:  Discussions with consultants, evaluation of patient's response to treatment, examination of patient, ordering and performing treatments and interventions, ordering and review of laboratory studies, ordering and review of radiographic studies, pulse oximetry, re-evaluation of patient's condition, obtaining history from patient or surrogate and review of [place] - Abnormal; Notable for the  following components:      Result Value   Glucose, Bld 167 (*)    Creatinine, Ser 1.34 (*)    Calcium 11.1 (*)    GFR calc non Af Amer 53 (*)    All other components within normal limits  CBC WITH DIFFERENTIAL/PLATELET - Abnormal; Notable for the following components:   RDW 15.9 (*)    All other components within normal limits  SARS CORONAVIRUS 2 BY RT PCR (HOSPITAL ORDER, [facility])  PROTIME-INR  APTT  HEMOGLOBIN A1C  DG Ribs Unilateral W/Chest Left  Result Date: 06/30/2019 CLINICAL DATA:  Left axillary pain.  Fall. EXAM: LEFT RIBS AND CHEST - 3+ VIEW COMPARISON:  07/06/2004 FINDINGS: No visible rib fracture. Lungs clear. Heart  is normal size. No effusions or pneumothorax. IMPRESSION: No visible rib fracture. No acute cardiopulmonary disease. Electronically Signed   By: Rolm Baptise M.D.   On: 06/30/2019 13:48   MR BRAIN WO CONTRAST  Result Date: 06/30/2019 CLINICAL DATA:  69 year old male with unexplained right lower extremity weakness, progressive since November. Recent fall and subsequent new left leg weakness. Hyperreflexia and clonus on lower extremity exam. Query stroke, myelitis, central cord compression. EXAM: MRI HEAD WITHOUT CONTRAST TECHNIQUE: Multiplanar, multiecho pulse sequences of the brain and surrounding structures were obtained without intravenous contrast. COMPARISON:  Head CT without contrast 03/03/2018. FINDINGS: Brain: No restricted diffusion to suggest acute infarction. No midline shift, mass effect, evidence of mass lesion, ventriculomegaly, extra-axial collection or acute intracranial hemorrhage. Cervicomedullary junction and pituitary are within normal limits. No cortical encephalomalacia. No chronic cerebral blood products. Minimal to mild for age nonspecific scattered cerebral white matter T2 and FLAIR hyperintensity. Similar mild nonspecific T2 and FLAIR hyperintensity in the pons, and also the globus pallidus. The other deep gray nuclei in the cerebellum appear normal. Vascular: Major intracranial vascular flow voids are preserved. There is mild generalized intracranial artery tortuosity. Skull and upper cervical spine: Cervical spine details reported separately. Visualized bone marrow signal is within normal limits. Sinuses/Orbits: Negative orbits.  Paranasal sinuses are clear. Other: Mild mastoid effusions, mostly on the right. Visible nasopharynx appears within normal limits. There is a 12 mm round soft tissue nodule located along the inferior margin of the left parotid gland with possible hypercellularity on DWI (series 4, image 4 and series 3, image 26). However, there is a nearby slightly smaller  nodule with similar MRI signal (series 4, image 3 and series 3, image 27). Other visible face and scalp soft tissues appear negative. IMPRESSION: 1. No acute intracranial abnormality. Mild for age nonspecific signal changes in the cerebral white matter and pons. 2. 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 3. Cervical and Thoracic MRI reported separately today. Electronically Signed   By: Genevie Ann M.D.   On: 06/30/2019 15:39   MR CERVICAL SPINE WO CONTRAST  Addendum Date: 06/30/2019   ADDENDUM REPORT: 06/30/2019 16:16 ADDENDUM: Critical Value/emergent results were called by telephone at the time of interpretation on 06/30/2019 at 1606 hours to ED Dr. Octaviano Glow who verbally acknowledged these results. And he had already discussed the exam with Neurology at that time. Electronically Signed   By: Genevie Ann M.D.   On: 06/30/2019 16:16   Result Date: 06/30/2019 CLINICAL DATA:  69 year old male with unexplained right lower extremity weakness, progressive since November. Recent fall and subsequent new left leg weakness.  Hyperreflexia and clonus on lower extremity exam. Query stroke, myelitis, central cord compression. EXAM: MRI CERVICAL AND THORACIC SPINE WITHOUT CONTRAST TECHNIQUE: Multiplanar and multiecho pulse sequences of the cervical spine, to include the craniocervical junction and cervicothoracic junction, and the thoracic spine, were obtained without intravenous contrast. COMPARISON:  Brain MRI today reported separately. FINDINGS: MRI CERVICAL SPINE FINDINGS Alignment: Straightening of cervical lordosis. Subtle anterolisthesis at C3-C4 and C7-T1. Vertebrae: No marrow edema or evidence of acute osseous abnormality. Visualized bone marrow signal is within normal limits. Cord: Cervical spinal cord signal remains normal despite several levels of degenerative spinal stenosis with  mild cord mass effect detailed below. Posterior Fossa, vertebral arteries, paraspinal tissues: Cervicomedullary junction is within normal limits. Brain findings reported separately today. Preserved major vascular flow voids in the neck. Negative visible neck soft tissues and right lung apex. Disc levels: Widespread cervical spine degeneration, most notable for: C3-C4: Circumferential disc bulge and endplate spurring plus facet hypertrophy. Borderline to mild spinal stenosis with moderate to severe C4 foraminal stenosis greater on the left. C4-C5: Disc space loss with circumferential disc osteophyte complex and facet hypertrophy. Mild spinal stenosis with up to mild spinal cord mass effect. Severe bilateral C5 foraminal stenosis. C5-C6: Disc space loss with bulky circumferential disc osteophyte complex. Mild facet but moderate ligament flavum hypertrophy. Spinal stenosis with mild spinal cord mass effect. Severe bilateral C6 foraminal stenosis. C6-C7: Disc space loss with circumferential disc osteophyte complex. Bulky right paracentral posterior component of disc extrusion (series 19, image 20). Mild posterior element hypertrophy. Mild spinal stenosis and right hemi cord mass effect. Moderate left and moderate to severe right C7 foraminal stenosis. MRI THORACIC SPINE FINDINGS Segmentation: Appears to be normal. Alignment: Preserved thoracic kyphosis. No significant thoracic scoliosis. There is subtle degenerative appearing anterolisthesis at T9-T10. Vertebrae: No marrow edema or evidence of acute osseous abnormality. Probable benign vertebral body hemangioma centrally at T10 although with decreased T1 signal. Elsewhere visible bone marrow signal is within normal limits. Cord: There is broad-based compression of the thoracic spinal cord at the T6 level (series 20, image 12) which appears to be degenerative in nature. There is associated abnormal T2 and STIR hyperintense signal within the cord at the level of  compression, as well as just below the compressed level extending to mid T7 (also image 12) which resembles myelomalacia on axial series 25, image 6. No definite spinal cord hemorrhage. Above the level of compression there is also a smaller focus of abnormal central to slightly left paracentral cord signal superiorly at T5 best seen on series 24, image 14. This is less specific and might be a small syrinx. Below T7 thoracic spinal cord signal appears normal to the conus which is at L1. Paraspinal and other soft tissues: Negative visible thoracic and upper abdominal viscera. Thoracic paraspinal soft tissues are within normal limits. Disc levels: Widespread thoracic spine degeneration, but most notable for: T2-T3: Multifactorial mild spinal stenosis related to disc bulging and posterior element hypertrophy. Mild if any cord mass effect. Moderate to severe bilateral T2 foraminal stenosis. T6-T7: Severe loss of the disc space with what appears to be a bulky extruded posterior disc fragment with mostly cephalad migration (series 20 image 12 and series 21, image 12). Superimposed moderate posterior element hypertrophy. Subsequent severe spinal stenosis and cord compression (series 25, image 4) with abnormal cord signal. At least moderate associated bilateral T6 foraminal stenosis. T7-T8 through T9-T10: Disc bulging, endplate spurring and up to moderate degenerative facet arthropathy including trace degenerative facet joint  fluid at the latter 2 levels. Borderline to mild spinal stenosis at these levels with no cord mass effect. Bilateral foraminal stenosis which is moderate on the left at the T8 nerve level. IMPRESSION: 1. Severe degenerative spinal stenosis and Severe SPINAL CORD COMPRESSION at T6-T7 related to bulky disc extrusion. Abnormal cord signal likely due to a combination of cord edema/contusion/myelomalacia in this clinical setting. There might also be a small focus of secondary spinal cord syrinx at T5. 2.  Other cervical and thoracic degenerative spinal stenosis with only mild cord mass effect. No other cord signal abnormality. 3. No evidence of acute osseous abnormality or ligamentous injury in the cervical or thoracic spine. Neurology consultant was paged via [place] regarding these findings at 1542 hours. But I will also discuss this exam with the Emergency Department provider and issue an addendum. Electronically Signed: By: Genevie Ann M.D. On: 06/30/2019 16:01   MR THORACIC SPINE WO CONTRAST  Addendum Date: 06/30/2019   ADDENDUM REPORT: 06/30/2019 16:16 ADDENDUM: Critical Value/emergent results were called by telephone at the time of interpretation on 06/30/2019 at 1606 hours to ED Dr. Octaviano Glow who verbally acknowledged these results. And he had already discussed the exam with Neurology at that time. Electronically Signed   By: Genevie Ann M.D.   On: 06/30/2019 16:16   Result Date: 06/30/2019 CLINICAL DATA:  69 year old male with unexplained right lower extremity weakness, progressive since November. Recent fall and subsequent new left leg weakness. Hyperreflexia and clonus on lower extremity exam. Query stroke, myelitis, central cord compression. EXAM: MRI CERVICAL AND THORACIC SPINE WITHOUT CONTRAST TECHNIQUE: Multiplanar and multiecho pulse sequences of the cervical spine, to include the craniocervical junction and cervicothoracic junction, and the thoracic spine, were obtained without intravenous contrast. COMPARISON:  Brain MRI today reported separately. FINDINGS: MRI CERVICAL SPINE FINDINGS Alignment: Straightening of cervical lordosis. Subtle anterolisthesis at C3-C4 and C7-T1. Vertebrae: No marrow edema or evidence of acute osseous abnormality. Visualized bone marrow signal is within normal limits. Cord: Cervical spinal cord signal remains normal despite several levels of degenerative spinal stenosis with mild cord mass effect detailed below. Posterior Fossa, vertebral arteries, paraspinal tissues:  Cervicomedullary junction is within normal limits. Brain findings reported separately today. Preserved major vascular flow voids in the neck. Negative visible neck soft tissues and right lung apex. Disc levels: Widespread cervical spine degeneration, most notable for: C3-C4: Circumferential disc bulge and endplate spurring plus facet hypertrophy. Borderline to mild spinal stenosis with moderate to severe C4 foraminal stenosis greater on the left. C4-C5: Disc space loss with circumferential disc osteophyte complex and facet hypertrophy. Mild spinal stenosis with up to mild spinal cord mass effect. Severe bilateral C5 foraminal stenosis. C5-C6: Disc space loss with bulky circumferential disc osteophyte complex. Mild facet but moderate ligament flavum hypertrophy. Spinal stenosis with mild spinal cord mass effect. Severe bilateral C6 foraminal stenosis. C6-C7: Disc space loss with circumferential disc osteophyte complex. Bulky right paracentral posterior component of disc extrusion (series 19, image 20). Mild posterior element hypertrophy. Mild spinal stenosis and right hemi cord mass effect. Moderate left and moderate to severe right C7 foraminal stenosis. MRI THORACIC SPINE FINDINGS Segmentation: Appears to be normal. Alignment: Preserved thoracic kyphosis. No significant thoracic scoliosis. There is subtle degenerative appearing anterolisthesis at T9-T10. Vertebrae: No marrow edema or evidence of acute osseous abnormality. Probable benign vertebral body hemangioma centrally at T10 although with decreased T1 signal. Elsewhere visible bone marrow signal is within normal limits. Cord: There is broad-based compression of the thoracic spinal  cord at the T6 level (series 20, image 12) which appears to be degenerative in nature. There is associated abnormal T2 and STIR hyperintense signal within the cord at the level of compression, as well as just below the compressed level extending to mid T7 (also image 12) which  resembles myelomalacia on axial series 25, image 6. No definite spinal cord hemorrhage. Above the level of compression there is also a smaller focus of abnormal central to slightly left paracentral cord signal superiorly at T5 best seen on series 24, image 14. This is less specific and might be a small syrinx. Below T7 thoracic spinal cord signal appears normal to the conus which is at L1. Paraspinal and other soft tissues: Negative visible thoracic and upper abdominal viscera. Thoracic paraspinal soft tissues are within normal limits. Disc levels: Widespread thoracic spine degeneration, but most notable for: T2-T3: Multifactorial mild spinal stenosis related to disc bulging and posterior element hypertrophy. Mild if any cord mass effect. Moderate to severe bilateral T2 foraminal stenosis. T6-T7: Severe loss of the disc space with what appears to be a bulky extruded posterior disc fragment with mostly cephalad migration (series 20 image 12 and series 21, image 12). Superimposed moderate posterior element hypertrophy. Subsequent severe spinal stenosis and cord compression (series 25, image 4) with abnormal cord signal. At least moderate associated bilateral T6 foraminal stenosis. T7-T8 through T9-T10: Disc bulging, endplate spurring and up to moderate degenerative facet arthropathy including trace degenerative facet joint fluid at the latter 2 levels. Borderline to mild spinal stenosis at these levels with no cord mass effect. Bilateral foraminal stenosis which is moderate on the left at the T8 nerve level. IMPRESSION: 1. Severe degenerative spinal stenosis and Severe SPINAL CORD COMPRESSION at T6-T7 related to bulky disc extrusion. Abnormal cord signal likely due to a combination of cord edema/contusion/myelomalacia in this clinical setting. There might also be a small focus of secondary spinal cord syrinx at T5. 2. Other cervical and thoracic degenerative spinal stenosis with only mild cord mass effect. No other  cord signal abnormality. 3. No evidence of acute osseous abnormality or ligamentous injury in the cervical or thoracic spine. Neurology consultant was paged via [place] regarding these findings at 1542 hours. But I will also discuss this exam with the Emergency Department provider and issue an addendum. Electronically Signed: By: Genevie Ann M.D. On: 06/30/2019 16:01   DG Pelvis Portable  Result Date: 06/30/2019 CLINICAL DATA:  69 year old male with unexplained right lower extremity weakness, progressive since November. Recent fall and subsequent new left leg weakness. Hyperreflexia and clonus on lower extremity exam. Query stroke, myelitis, central cord compression. EXAM: PORTABLE PELVIS 1-2 VIEWS COMPARISON:  Thoracic MRI today reported separately. FINDINGS: Portable AP supine view at 1526 hours. The pelvis is mildly rotated to the right. Bone mineralization is within normal limits. Femoral heads are normally located. The entire inferior pubic rami are not included, but no pelvic fracture is identified. SI joints appear intact. Grossly intact visible proximal femurs. Negative visible lower abdominal and pelvic visceral contour. IMPRESSION: No acute fracture or dislocation identified about the pelvis. Electronically Signed   By: Genevie Ann M.D.   On: 06/30/2019 15:52   MDM  69 year old male previously seen by Kenton Kingfisher, PA-C.  See note for full HPI.  Patient with mechanical fall.  Has right lower extremity weakness over the past year with new onset LLE weakness.  Patient has been evaluated by attending, Dr. Ronnald Nian as well as neurology.  They recommended MRI brain, thoracic  and cervical.  Did not recommend MRI lumbar as low suspicion for cauda equina.  Case management has been consulted.  Disposition per MRI and neurology follow-up.     Patient reassessed.  Requesting pain medication.  Pending imaging official read  Labs and imaging personally reviewed and interpreted.  Reviewed neurology's consult  note.  Imaging with severe spinal cord compression at T6-T7 due to disc extrusion with possible contusion, edema. Plan to consult with NS.  CONSULT with [place], PA-C with Neurosurgery with will evaluate patient for admission.  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  He admitted to neurosurgery for further intervention.  Patient hemodynamically stable.  The patient appears reasonably stabilized for admission considering the current resources, flow, and capabilities available in the ED at this time, and I doubt any other [facility] requiring further screening and/or treatment in the ED prior to admission.      Drisana Schweickert A, PA-C 06/30/19 [place], [place], DO 07/03/19 1633

## 2019-06-30 NOTE — Discharge Planning (Signed)
Pt son in waiting area.  EDCM to follow for disposition needs.

## 2019-06-30 NOTE — ED Notes (Signed)
Lab to add on PT-INR and APTT

## 2019-06-30 NOTE — ED Provider Notes (Addendum)
[facility] EMERGENCY DEPARTMENT Provider Note   CSN: [id number] Arrival date & time: 06/30/19  1144     History Chief Complaint  Patient presents with  . Fall    Timothy Davenport is a 69 y.o. male who presents emergency department chief complaint of mechanical fall.  He generally walks with a walker.  Today he was walking with his walker when he tripped over a shoe, fell and hit his left rib cage on his walker he then fell to the floor and was unable to get up from the floor.  He did not hit his head or lose consciousness.  He is not on any blood thinners.  He complains of pain in his left rib.  He denies shortness of breath.  HPI     Past Medical History:  Diagnosis Date  . Diabetes mellitus without complication ([place])     There are no problems to display for this patient.        No family history on file.  Social History   Tobacco Use  . Smoking status: Not on file  Substance Use Topics  . Alcohol use: Not on file  . Drug use: Not on file    Home Medications Prior to Admission medications   Medication Sig Start Date End Date Taking? Authorizing Provider  amLODipine (NORVASC) 10 MG tablet Take 10 mg by mouth daily.    [provider]  aspirin 81 MG chewable tablet Chew 81 mg by mouth every morning.    [provider]  glipiZIDE (GLUCOTROL) 5 MG tablet Take 5-10 mg by mouth See admin instructions. Take one tablet (5mg ) in the AM and 2 tablets (10mg ) in the evening    [provider]  hydrALAZINE (APRESOLINE) 10 MG tablet Take 10 mg by mouth 2 (two) times daily.    [provider]  losartan (COZAAR) 100 MG tablet Take 100 mg by mouth daily.    [provider]  pravastatin (PRAVACHOL) 20 MG tablet Take 20 mg by mouth daily.    [provider]  sildenafil (VIAGRA) 100 MG tablet Take 50 mg by mouth as directed. Not to exceed 1 dose per 24 hours    [provider]    Allergies    Patient  has no allergy information on record.  Review of Systems   Review of Systems Ten systems reviewed and are negative for acute change, except as noted in the HPI.   Physical Exam Updated Vital Signs BP 137/77 (BP Location: Left Arm)   Pulse (!) 57   Temp 98 F (36.7 C) (Oral)   Resp 16   Ht 5\' 11"  (1.803 m)   Wt 79.8 kg   SpO2 100%   BMI 24.55 kg/m   Physical Exam Vitals and nursing note reviewed.  Constitutional:      General: He is not in acute distress.    Appearance: He is well-developed. He is not diaphoretic.  HENT:     Head: Normocephalic and atraumatic.  Eyes:     General: No scleral icterus.    Conjunctiva/sclera: Conjunctivae normal.  Cardiovascular:     Rate and Rhythm: Normal rate and regular rhythm.     Heart sounds: Normal heart sounds.  Pulmonary:     Effort: Pulmonary effort is normal. No respiratory distress.     Breath sounds: Normal breath sounds.  Chest:     Chest wall: Tenderness present. No deformity, crepitus or edema.  Comments: Abrasion to the anterior chest wall Tenderness over the lateral chest wall with some erythema no obvious bruising, no crepitus, no step-off Abdominal:     Palpations: Abdomen is soft.     Tenderness: There is no abdominal tenderness.  Musculoskeletal:     Cervical back: Normal range of motion and neck supple.  Skin:    General: Skin is warm and dry.  Neurological:     Mental Status: He is alert.     Motor: Weakness present.     Deep Tendon Reflexes: Reflexes abnormal.     Comments: RLE with sustained clonus at the ankle.  BL patella are hyperreflexxive. Normal sensation. Unable to plantar or dorsiflex the left ankle, unable to lift the leg off the bed.  Unable to lift the R leg off the bed, weak plantar and dorsiflexion of the right ankle. Upper extremities with equal BL grip strength and sensation.  Psychiatric:        Behavior: Behavior normal.     ED Results / Procedures / Treatments   Labs (all  labs ordered are listed, but only abnormal results are displayed) Labs Reviewed - No data to display  EKG None  Radiology No results found.  Procedures .Critical Care Performed by: Margarita Mail, PA-C Authorized by: Margarita Mail, PA-C   Critical care provider statement:    Critical care time (minutes):  75   Critical care time was exclusive of:  Separately billable procedures and treating other patients   Critical care was necessary to treat or prevent imminent or life-threatening deterioration of the following conditions:  CNS failure or compromise   Critical care was time spent personally by me on the following activities:  Discussions with consultants, evaluation of patient's response to treatment, examination of patient, ordering and performing treatments and interventions, ordering and review of laboratory studies, ordering and review of radiographic studies, pulse oximetry, re-evaluation of patient's condition, obtaining history from patient or surrogate and review of old charts   (including critical care time)  Medications Ordered in ED Medications - No data to display  ED Course  I have reviewed the triage vital signs and the nursing notes.  Pertinent labs & imaging results that were available during my care of the patient were reviewed by me and considered in my medical decision making (see chart for details).    MDM Rules/Calculators/A&P                      Is a 69 year old male who presents the emergency department with mechanical fall.  He has had progressive loss of use of the right leg since November and has been using his left leg and a walker to ambulate.  He has followed at the [place] previously where he was told it was due to neuropathy or his vascular disease.  Patient has bounding pulses in his feet abnormal neurologic exam was sustained myoclonus of the left ankle.  No new weakness in the left ankle.  I personally ordered interpreted and reviewed the left rib  view chest x-ray which shows no acute abnormalities.  Likely rib contusion.  Patient is breathing well but has intermittent sharp pain.  Dr. Ronnald Nian and I saw the patient and shared visit I he consulted with neurology who saw and evaluated the patient and recommended MR of the brain cervical and thoracic spine.  Those images are pending.  I have extreme concern for safety of discharge.  Patient does have a wheelchair at the house  but is there with his wife.  He does not have a lift and is unable to help support himself at all right now.  I given signout to Dr. Langston Masker and PA [facility] who will assume care of the patient.  He is stable throughout his visit.  I expect the patient will need admission     Final Clinical Impression(s) / ED Diagnoses Final diagnoses:  None    Rx / DC Orders ED Discharge Orders    None       Margarita Mail, PA-C 06/30/19 [place], [place], PA-C 07/01/19 2122    Margarita Mail, PA-C 07/01/19 2122    Lennice Sites, DO 07/03/19 1633

## 2019-06-30 NOTE — H&P (Signed)
Chief Complaint   Chief Complaint  Patient presents with  . Fall    HPI   Consult requested by: EDP [facility] Reason for consult: thoracic spinal stenosis, paraplegia  HPI: Timothy Davenport is a 69 y.o. male with history of HTN, HDL, DM (last A1C <7%), ?recent TIA on plavix daily who presented to ED after a fall and inability to move BLE (RLE weakness chronic). Briefly, 1 month ago, patient developed RLE requiring the use of a rolling walker. Describes being able to use his leg "with assistance" of his wife, but otherwise no significant anti-gravity strength. [facility]. This was without evidence of stroke, but patient was diagnosed with what sounds like a TIA, despite symptoms lasting as long as they had. He was then placed on plavix. He has been going to PT since without improvement. Wife states he saw the [place] yesterday and they were ordering an MRI of his spine to further evaluate symptoms. Unfortunately today, he fell and landed on walker. Since the fall, he has been unable to move LLE and RLE weakness has also worsened. He underwent MRI brain, C/T spine per Neurology reccomendations and was found to have a large disc extrusion at T6-7 which results in severe spinal cord compression and associated T2 signal changes. NSY was called for further management. Patient was just given morphine prior to my arrival and is a little drowsy, but able to communicate, follow commands, etc. He is without any pain . His wife, [name], is at bedside. She confirms the above history.   Patient Active Problem List   Diagnosis Date Noted  . 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 Diabetes mellitus without complication ([place])   . Diabetic neuropathy (HCC)     PSH: (Not in a hospital admission)   SH: Social History   Tobacco Use  . Smoking status: Not on file  Substance Use Topics  . Alcohol use: Not on file  . Drug  use: Not on file    MEDS: Prior to Admission medications   Medication Sig Start Date End Date Taking? Authorizing Provider  amLODipine (NORVASC) 10 MG tablet Take 10 mg by mouth daily.    [provider]  aspirin 81 MG chewable tablet Chew 81 mg by mouth every morning.    [provider]  glipiZIDE (GLUCOTROL) 5 MG tablet Take 5-10 mg by mouth See admin instructions. Take one tablet (5mg ) in the AM and 2 tablets (10mg ) in the evening    [provider]  hydrALAZINE (APRESOLINE) 10 MG tablet Take 10 mg by mouth 2 (two) times daily.    [provider]  losartan (COZAAR) 100 MG tablet Take 100 mg by mouth daily.    [provider]  pravastatin (PRAVACHOL) 20 MG tablet Take 20 mg by mouth daily.    [provider]  sildenafil (VIAGRA) 100 MG tablet Take 50 mg by mouth as directed. Not to exceed 1 dose per 24 hours    [provider]    ALLERGY: Not on File  Social History   Tobacco Use  . Smoking status: Not on file  Substance Use Topics  . Alcohol use: Not on file     Family History  Problem Relation Age of Onset  . Hypertension Mother   . Hypertension Father      ROS   Review of Systems  Constitutional:  Negative.   HENT: Negative.   Eyes: Negative.   Respiratory: Negative.   Cardiovascular: Negative.   Gastrointestinal: Negative.   Genitourinary: Negative.   Musculoskeletal: Positive for falls. Negative for back pain, joint pain, myalgias and neck pain.  Skin: Negative.   Neurological: Positive for focal weakness (BLE). Negative for dizziness, tingling, tremors, sensory change, speech change, loss of consciousness and headaches.    Exam   Vitals:   06/30/19 1300 06/30/19 1700  BP:  136/76  Pulse: 86 90  Resp: 15 17  Temp:    SpO2: 100% 98%   General appearance: elderly male, resting comfortably, NAD Eyes: No scleral injection Cardiovascular: Regular rate and rhythm without murmurs, rubs, gallops.  No edema or variciosities. Distal pulses normal. Pulmonary: Effort normal, non-labored breathing Musculoskeletal:     Muscle tone upper extremities: Normal    Muscle tone lower extremities: Normal    Motor exam: Upper Extremities Deltoid Bicep Tricep Grip  Right 5/5 5/5 5/5 5/5  Left 5/5 5/5 5/5 5/5   Lower Extremity IP Quad PF DF EHL  Right 2/5 2/5 1/5 1/5 3/5  Left 2/5 2/5 1/5 1/5 1/5   Neurological Mental Status:    - Patient is awake, alert, oriented to person, place, month, year, and situation    - Patient is able to give a clear and coherent history.    - No signs of aphasia or neglect Cranial Nerves    - II: Visual Fields are full. PERRL    - III/IV/VI: EOMI without ptosis or diploplia.     - V: Facial sensation is grossly normal    - VII: Facial movement is symmetric.     - VIII: hearing is intact to voice    - X: Uvula elevates symmetrically    - XI: Shoulder shrug is symmetric.    - XII: tongue is midline without atrophy or fasciculations.  Sensory: Sensation grossly intact to LT, perhaps mild decrease lower 1/3 bilaterally Deep Tendon Reflexes    - 3+ and symmetric in patellae. Nonsustained clonus RLE with ankle jerk Plantars   - Toes are downgoing bilaterally.  Cerebellar    - FNF and HKS are intact bilaterally   Results - Imaging/Labs   Results for orders placed or performed during the hospital encounter of 06/30/19 (from the past 48 hour(s))  Basic metabolic panel     Status: Abnormal   Collection Time: 06/30/19  1:52 PM  Result Value Ref Range   Sodium 142 135 - 145 mmol/L   Potassium 3.5 3.5 - 5.1 mmol/L   Chloride 102 98 - 111 mmol/L   CO2 28 22 - 32 mmol/L   Glucose, Bld 167 (H) 70 - 99 mg/dL    Comment: Glucose reference range applies only to samples taken after fasting for at least 8 hours.   BUN 19 8 - 23 mg/dL   Creatinine, Ser 1.34 (H) 0.61 - 1.24 mg/dL   Calcium 11.1 (H) 8.9 - 10.3 mg/dL   GFR calc non Af Amer 53 (L) >60 mL/min   GFR calc  Af Amer >60 >60 mL/min   Anion gap 12 5 - 15    Comment: Performed at [facility] [address], [place], [place] [address]  CBC with Differential     Status: Abnormal   Collection Time: 06/30/19  1:52 PM  Result Value Ref Range   WBC 8.1 4.0 - 10.5 K/uL   RBC 5.39 4.22 - 5.81 MIL/uL   Hemoglobin 14.8  13.0 - 17.0 g/dL   HCT 46.5 39.0 - 52.0 %   MCV 86.3 80.0 - 100.0 fL   MCH 27.5 26.0 - 34.0 pg   MCHC 31.8 30.0 - 36.0 g/dL   RDW 15.9 (H) 11.5 - 15.5 %   Platelets 199 150 - 400 K/uL   nRBC 0.0 0.0 - 0.2 %   Neutrophils Relative % 80 %   Neutro Abs 6.5 1.7 - 7.7 K/uL   Lymphocytes Relative 15 %   Lymphs Abs 1.2 0.7 - 4.0 K/uL   Monocytes Relative 5 %   Monocytes Absolute 0.4 0.1 - 1.0 K/uL   Eosinophils Relative 0 %   Eosinophils Absolute 0.0 0.0 - 0.5 K/uL   Basophils Relative 0 %   Basophils Absolute 0.0 0.0 - 0.1 K/uL   Immature Granulocytes 0 %   Abs Immature Granulocytes 0.03 0.00 - 0.07 K/uL    Comment: Performed at [facility] [address], [place], [place] [address]    DG Ribs Unilateral W/Chest Left  Result Date: 06/30/2019 CLINICAL DATA:  Left axillary pain.  Fall. EXAM: LEFT RIBS AND CHEST - 3+ VIEW COMPARISON:  07/06/2004 FINDINGS: No visible rib fracture. Lungs clear. Heart is normal size. No effusions or pneumothorax. IMPRESSION: No visible rib fracture. No acute cardiopulmonary disease. Electronically Signed   By: Rolm Baptise M.D.   On: 06/30/2019 13:48   MR BRAIN WO CONTRAST  Result Date: 06/30/2019 CLINICAL DATA:  69 year old male with unexplained right lower extremity weakness, progressive since November. Recent fall and subsequent new left leg weakness. Hyperreflexia and clonus on lower extremity exam. Query stroke, myelitis, central cord compression. EXAM: MRI HEAD WITHOUT CONTRAST TECHNIQUE: Multiplanar, multiecho pulse sequences of the brain and surrounding structures were obtained without intravenous contrast. COMPARISON:  Head CT without  contrast 03/03/2018. FINDINGS: Brain: No restricted diffusion to suggest acute infarction. No midline shift, mass effect, evidence of mass lesion, ventriculomegaly, extra-axial collection or acute intracranial hemorrhage. Cervicomedullary junction and pituitary are within normal limits. No cortical encephalomalacia. No chronic cerebral blood products. Minimal to mild for age nonspecific scattered cerebral white matter T2 and FLAIR hyperintensity. Similar mild nonspecific T2 and FLAIR hyperintensity in the pons, and also the globus pallidus. The other deep gray nuclei in the cerebellum appear normal. Vascular: Major intracranial vascular flow voids are preserved. There is mild generalized intracranial artery tortuosity. Skull and upper cervical spine: Cervical spine details reported separately. Visualized bone marrow signal is within normal limits. Sinuses/Orbits: Negative orbits.  Paranasal sinuses are clear. Other: Mild mastoid effusions, mostly on the right. Visible nasopharynx appears within normal limits. There is a 12 mm round soft tissue nodule located along the inferior margin of the left parotid gland with possible hypercellularity on DWI (series 4, image 4 and series 3, image 26). However, there is a nearby slightly smaller nodule with similar MRI signal (series 4, image 3 and series 3, image 27). Other visible face and scalp soft tissues appear negative. IMPRESSION: 1. No acute intracranial abnormality. Mild for age nonspecific signal changes in the cerebral white matter and pons. 2. 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 3. Cervical and Thoracic MRI reported separately today. Electronically Signed   By: Genevie Ann M.D.   On: 06/30/2019 15:39   MR CERVICAL SPINE WO CONTRAST  Addendum Date: 06/30/2019   ADDENDUM REPORT: 06/30/2019 16:16 ADDENDUM: Critical Value/emergent  results were called by telephone at the time of interpretation on 06/30/2019 at 1606 hours to ED Dr. Octaviano Glow who verbally acknowledged these results. And he had already discussed the exam with Neurology at that time. Electronically Signed   By: Genevie Ann M.D.   On: 06/30/2019 16:16   Result Date: 06/30/2019 CLINICAL DATA:  69 year old male with unexplained right lower extremity weakness, progressive since November. Recent fall and subsequent new left leg weakness. Hyperreflexia and clonus on lower extremity exam. Query stroke, myelitis, central cord compression. EXAM: MRI CERVICAL AND THORACIC SPINE WITHOUT CONTRAST TECHNIQUE: Multiplanar and multiecho pulse sequences of the cervical spine, to include the craniocervical junction and cervicothoracic junction, and the thoracic spine, were obtained without intravenous contrast. COMPARISON:  Brain MRI today reported separately. FINDINGS: MRI CERVICAL SPINE FINDINGS Alignment: Straightening of cervical lordosis. Subtle anterolisthesis at C3-C4 and C7-T1. Vertebrae: No marrow edema or evidence of acute osseous abnormality. Visualized bone marrow signal is within normal limits. Cord: Cervical spinal cord signal remains normal despite several levels of degenerative spinal stenosis with mild cord mass effect detailed below. Posterior Fossa, vertebral arteries, paraspinal tissues: Cervicomedullary junction is within normal limits. Brain findings reported separately today. Preserved major vascular flow voids in the neck. Negative visible neck soft tissues and right lung apex. Disc levels: Widespread cervical spine degeneration, most notable for: C3-C4: Circumferential disc bulge and endplate spurring plus facet hypertrophy. Borderline to mild spinal stenosis with moderate to severe C4 foraminal stenosis greater on the left. C4-C5: Disc space loss with circumferential disc osteophyte complex and facet hypertrophy. Mild spinal stenosis with up to mild spinal cord mass  effect. Severe bilateral C5 foraminal stenosis. C5-C6: Disc space loss with bulky circumferential disc osteophyte complex. Mild facet but moderate ligament flavum hypertrophy. Spinal stenosis with mild spinal cord mass effect. Severe bilateral C6 foraminal stenosis. C6-C7: Disc space loss with circumferential disc osteophyte complex. Bulky right paracentral posterior component of disc extrusion (series 19, image 20). Mild posterior element hypertrophy. Mild spinal stenosis and right hemi cord mass effect. Moderate left and moderate to severe right C7 foraminal stenosis. MRI THORACIC SPINE FINDINGS Segmentation: Appears to be normal. Alignment: Preserved thoracic kyphosis. No significant thoracic scoliosis. There is subtle degenerative appearing anterolisthesis at T9-T10. Vertebrae: No marrow edema or evidence of acute osseous abnormality. Probable benign vertebral body hemangioma centrally at T10 although with decreased T1 signal. Elsewhere visible bone marrow signal is within normal limits. Cord: There is broad-based compression of the thoracic spinal cord at the T6 level (series 20, image 12) which appears to be degenerative in nature. There is associated abnormal T2 and STIR hyperintense signal within the cord at the level of compression, as well as just below the compressed level extending to mid T7 (also image 12) which resembles myelomalacia on axial series 25, image 6. No definite spinal cord hemorrhage. Above the level of compression there is also a smaller focus of abnormal central to slightly left paracentral cord signal superiorly at T5 best seen on series 24, image 14. This is less specific and might be a small syrinx. Below T7 thoracic spinal cord signal appears normal to the conus which is at L1. Paraspinal and other soft tissues: Negative visible thoracic and upper abdominal viscera. Thoracic paraspinal soft tissues are within normal limits. Disc levels: Widespread thoracic spine degeneration, but  most notable for: T2-T3: Multifactorial mild spinal stenosis related to disc bulging and posterior element hypertrophy. Mild if any cord mass effect. Moderate to severe bilateral T2 foraminal stenosis.  T6-T7: Severe loss of the disc space with what appears to be a bulky extruded posterior disc fragment with mostly cephalad migration (series 20 image 12 and series 21, image 12). Superimposed moderate posterior element hypertrophy. Subsequent severe spinal stenosis and cord compression (series 25, image 4) with abnormal cord signal. At least moderate associated bilateral T6 foraminal stenosis. T7-T8 through T9-T10: Disc bulging, endplate spurring and up to moderate degenerative facet arthropathy including trace degenerative facet joint fluid at the latter 2 levels. Borderline to mild spinal stenosis at these levels with no cord mass effect. Bilateral foraminal stenosis which is moderate on the left at the T8 nerve level. IMPRESSION: 1. Severe degenerative spinal stenosis and Severe SPINAL CORD COMPRESSION at T6-T7 related to bulky disc extrusion. Abnormal cord signal likely due to a combination of cord edema/contusion/myelomalacia in this clinical setting. There might also be a small focus of secondary spinal cord syrinx at T5. 2. Other cervical and thoracic degenerative spinal stenosis with only mild cord mass effect. No other cord signal abnormality. 3. No evidence of acute osseous abnormality or ligamentous injury in the cervical or thoracic spine. Neurology consultant was paged via [place] regarding these findings at 1542 hours. But I will also discuss this exam with the Emergency Department provider and issue an addendum. Electronically Signed: By: Genevie Ann M.D. On: 06/30/2019 16:01   MR THORACIC SPINE WO CONTRAST  Addendum Date: 06/30/2019   ADDENDUM REPORT: 06/30/2019 16:16 ADDENDUM: Critical Value/emergent results were called by telephone at the time of interpretation on 06/30/2019 at 1606 hours to ED Dr.  Octaviano Glow who verbally acknowledged these results. And he had already discussed the exam with Neurology at that time. Electronically Signed   By: Genevie Ann M.D.   On: 06/30/2019 16:16   Result Date: 06/30/2019 CLINICAL DATA:  69 year old male with unexplained right lower extremity weakness, progressive since November. Recent fall and subsequent new left leg weakness. Hyperreflexia and clonus on lower extremity exam. Query stroke, myelitis, central cord compression. EXAM: MRI CERVICAL AND THORACIC SPINE WITHOUT CONTRAST TECHNIQUE: Multiplanar and multiecho pulse sequences of the cervical spine, to include the craniocervical junction and cervicothoracic junction, and the thoracic spine, were obtained without intravenous contrast. COMPARISON:  Brain MRI today reported separately. FINDINGS: MRI CERVICAL SPINE FINDINGS Alignment: Straightening of cervical lordosis. Subtle anterolisthesis at C3-C4 and C7-T1. Vertebrae: No marrow edema or evidence of acute osseous abnormality. Visualized bone marrow signal is within normal limits. Cord: Cervical spinal cord signal remains normal despite several levels of degenerative spinal stenosis with mild cord mass effect detailed below. Posterior Fossa, vertebral arteries, paraspinal tissues: Cervicomedullary junction is within normal limits. Brain findings reported separately today. Preserved major vascular flow voids in the neck. Negative visible neck soft tissues and right lung apex. Disc levels: Widespread cervical spine degeneration, most notable for: C3-C4: Circumferential disc bulge and endplate spurring plus facet hypertrophy. Borderline to mild spinal stenosis with moderate to severe C4 foraminal stenosis greater on the left. C4-C5: Disc space loss with circumferential disc osteophyte complex and facet hypertrophy. Mild spinal stenosis with up to mild spinal cord mass effect. Severe bilateral C5 foraminal stenosis. C5-C6: Disc space loss with bulky circumferential disc  osteophyte complex. Mild facet but moderate ligament flavum hypertrophy. Spinal stenosis with mild spinal cord mass effect. Severe bilateral C6 foraminal stenosis. C6-C7: Disc space loss with circumferential disc osteophyte complex. Bulky right paracentral posterior component of disc extrusion (series 19, image 20). Mild posterior element hypertrophy. Mild spinal stenosis and right hemi cord  mass effect. Moderate left and moderate to severe right C7 foraminal stenosis. MRI THORACIC SPINE FINDINGS Segmentation: Appears to be normal. Alignment: Preserved thoracic kyphosis. No significant thoracic scoliosis. There is subtle degenerative appearing anterolisthesis at T9-T10. Vertebrae: No marrow edema or evidence of acute osseous abnormality. Probable benign vertebral body hemangioma centrally at T10 although with decreased T1 signal. Elsewhere visible bone marrow signal is within normal limits. Cord: There is broad-based compression of the thoracic spinal cord at the T6 level (series 20, image 12) which appears to be degenerative in nature. There is associated abnormal T2 and STIR hyperintense signal within the cord at the level of compression, as well as just below the compressed level extending to mid T7 (also image 12) which resembles myelomalacia on axial series 25, image 6. No definite spinal cord hemorrhage. Above the level of compression there is also a smaller focus of abnormal central to slightly left paracentral cord signal superiorly at T5 best seen on series 24, image 14. This is less specific and might be a small syrinx. Below T7 thoracic spinal cord signal appears normal to the conus which is at L1. Paraspinal and other soft tissues: Negative visible thoracic and upper abdominal viscera. Thoracic paraspinal soft tissues are within normal limits. Disc levels: Widespread thoracic spine degeneration, but most notable for: T2-T3: Multifactorial mild spinal stenosis related to disc bulging and posterior element  hypertrophy. Mild if any cord mass effect. Moderate to severe bilateral T2 foraminal stenosis. T6-T7: Severe loss of the disc space with what appears to be a bulky extruded posterior disc fragment with mostly cephalad migration (series 20 image 12 and series 21, image 12). Superimposed moderate posterior element hypertrophy. Subsequent severe spinal stenosis and cord compression (series 25, image 4) with abnormal cord signal. At least moderate associated bilateral T6 foraminal stenosis. T7-T8 through T9-T10: Disc bulging, endplate spurring and up to moderate degenerative facet arthropathy including trace degenerative facet joint fluid at the latter 2 levels. Borderline to mild spinal stenosis at these levels with no cord mass effect. Bilateral foraminal stenosis which is moderate on the left at the T8 nerve level. IMPRESSION: 1. Severe degenerative spinal stenosis and Severe SPINAL CORD COMPRESSION at T6-T7 related to bulky disc extrusion. Abnormal cord signal likely due to a combination of cord edema/contusion/myelomalacia in this clinical setting. There might also be a small focus of secondary spinal cord syrinx at T5. 2. Other cervical and thoracic degenerative spinal stenosis with only mild cord mass effect. No other cord signal abnormality. 3. No evidence of acute osseous abnormality or ligamentous injury in the cervical or thoracic spine. Neurology consultant was paged via [place] regarding these findings at 1542 hours. But I will also discuss this exam with the Emergency Department provider and issue an addendum. Electronically Signed: By: Genevie Ann M.D. On: 06/30/2019 16:01   DG Pelvis Portable  Result Date: 06/30/2019 CLINICAL DATA:  69 year old male with unexplained right lower extremity weakness, progressive since November. Recent fall and subsequent new left leg weakness. Hyperreflexia and clonus on lower extremity exam. Query stroke, myelitis, central cord compression. EXAM: PORTABLE PELVIS 1-2 VIEWS  COMPARISON:  Thoracic MRI today reported separately. FINDINGS: Portable AP supine view at 1526 hours. The pelvis is mildly rotated to the right. Bone mineralization is within normal limits. Femoral heads are normally located. The entire inferior pubic rami are not included, but no pelvic fracture is identified. SI joints appear intact. Grossly intact visible proximal femurs. Negative visible lower abdominal and pelvic visceral contour. IMPRESSION: No  acute fracture or dislocation identified about the pelvis. Electronically Signed   By: Genevie Ann M.D.   On: 06/30/2019 15:45   Impression/Plan   69 y.o. male with severe spinal cord compression at T6-7 secondary to a large HNP and associated T2 signal changes resulting in lower extremity paraplegia. Given severity of MRI findings in combination with patient's presenting symptoms, he will need to under surgical decompression via T6-7 laminectomy and microdiscectomy. Surgery is scheduled for tomorrow afternoon with Dr Kathyrn Sheriff.  I had a long discussion with patient and his wife at bedside. I discussed the MRI findings with them as well as the recommendation for surgery. I have discussed the procedure in detail including risks, benefits and alternatives. They understand that despite surgery, his BLE function may not return to normal. They state understanding and wish to proceed.  T6-7 severe spinal stenosis - T6-7 laminectomy with microdiscectomy tomorrow afternoon  - NPO at midnight  On antiplatelet therapy - Plavix - hold Plavix - PT/INR ordered - May consider platelet transfusion tomorrow periop  HTN, HDL - continue home meds.  DM - Will add SSI and frequent CBG  Ferne Reus, PA-C [facility]

## 2019-06-30 NOTE — ED Notes (Signed)
Attempted to call report x3. Asked to call back and no answer x2.

## 2019-06-30 NOTE — ED Notes (Signed)
Pt was taken straight to MRI from xray

## 2019-06-30 NOTE — ED Notes (Signed)
Order for dinner tray placed

## 2019-07-01 ENCOUNTER — Inpatient Hospital Stay (HOSPITAL_COMMUNITY): Payer: No Typology Code available for payment source

## 2019-07-01 ENCOUNTER — Inpatient Hospital Stay (HOSPITAL_COMMUNITY): Payer: No Typology Code available for payment source | Admitting: Registered Nurse

## 2019-07-01 ENCOUNTER — Encounter (HOSPITAL_COMMUNITY)
Admission: EM | Disposition: A | Discharge status: Rehabilitation Facility | Payer: Self-pay | Source: Home / Self Care | Attending: Neurosurgery

## 2019-07-01 ENCOUNTER — Encounter (HOSPITAL_COMMUNITY): Payer: Self-pay | Admitting: Neurosurgery

## 2019-07-01 HISTORY — PX: LUMBAR LAMINECTOMY/DECOMPRESSION MICRODISCECTOMY: SHX5026

## 2019-07-01 LAB — CBC
HCT: 40.1 % (ref 39.0–52.0)
Hemoglobin: 12.8 g/dL — ABNORMAL LOW (ref 13.0–17.0)
MCH: 27.6 pg (ref 26.0–34.0)
MCHC: 31.9 g/dL (ref 30.0–36.0)
MCV: 86.4 fL (ref 80.0–100.0)
Platelets: 198 10*3/uL (ref 150–400)
RBC: 4.64 MIL/uL (ref 4.22–5.81)
RDW: 15.7 % — ABNORMAL HIGH (ref 11.5–15.5)
WBC: 13.9 10*3/uL — ABNORMAL HIGH (ref 4.0–10.5)
nRBC: 0 % (ref 0.0–0.2)

## 2019-07-01 LAB — MRSA PCR SCREENING: MRSA by PCR: NEGATIVE

## 2019-07-01 LAB — BASIC METABOLIC PANEL
Anion gap: 13 (ref 5–15)
BUN: 17 mg/dL (ref 8–23)
CO2: 24 mmol/L (ref 22–32)
Calcium: 10 mg/dL (ref 8.9–10.3)
Chloride: 104 mmol/L (ref 98–111)
Creatinine, Ser: 1.35 mg/dL — ABNORMAL HIGH (ref 0.61–1.24)
GFR calc Af Amer: >60 mL/min (ref >60)
GFR calc non Af Amer: 52 mL/min — ABNORMAL LOW (ref >60)
Glucose, Bld: 175 mg/dL — ABNORMAL HIGH (ref 70–99)
Potassium: 4.4 mmol/L (ref 3.5–5.1)
Sodium: 141 mmol/L (ref 135–145)

## 2019-07-01 LAB — ABO/RH: ABO/RH(D): A POS

## 2019-07-01 LAB — GLUCOSE, CAPILLARY
Glucose-Capillary: 140 mg/dL — ABNORMAL HIGH (ref 70–99)
Glucose-Capillary: 148 mg/dL — ABNORMAL HIGH (ref 70–99)
Glucose-Capillary: 155 mg/dL — ABNORMAL HIGH (ref 70–99)
Glucose-Capillary: 154 mg/dL — ABNORMAL HIGH (ref 70–99)

## 2019-07-01 LAB — TYPE AND SCREEN
ABO/RH(D): A POS
Antibody Screen: NEGATIVE

## 2019-07-01 IMAGING — RF DG C-ARM 1-60 MIN
1 series · 2 of 2 positions shown · non-contrast
Comparison: None.

CLINICAL DATA: T6-T7 laminectomy

EXAM:
THORACOLUMBAR SPINE 1V

[Series 1: run · 2 of 2 slices shown]
[im 1/2]
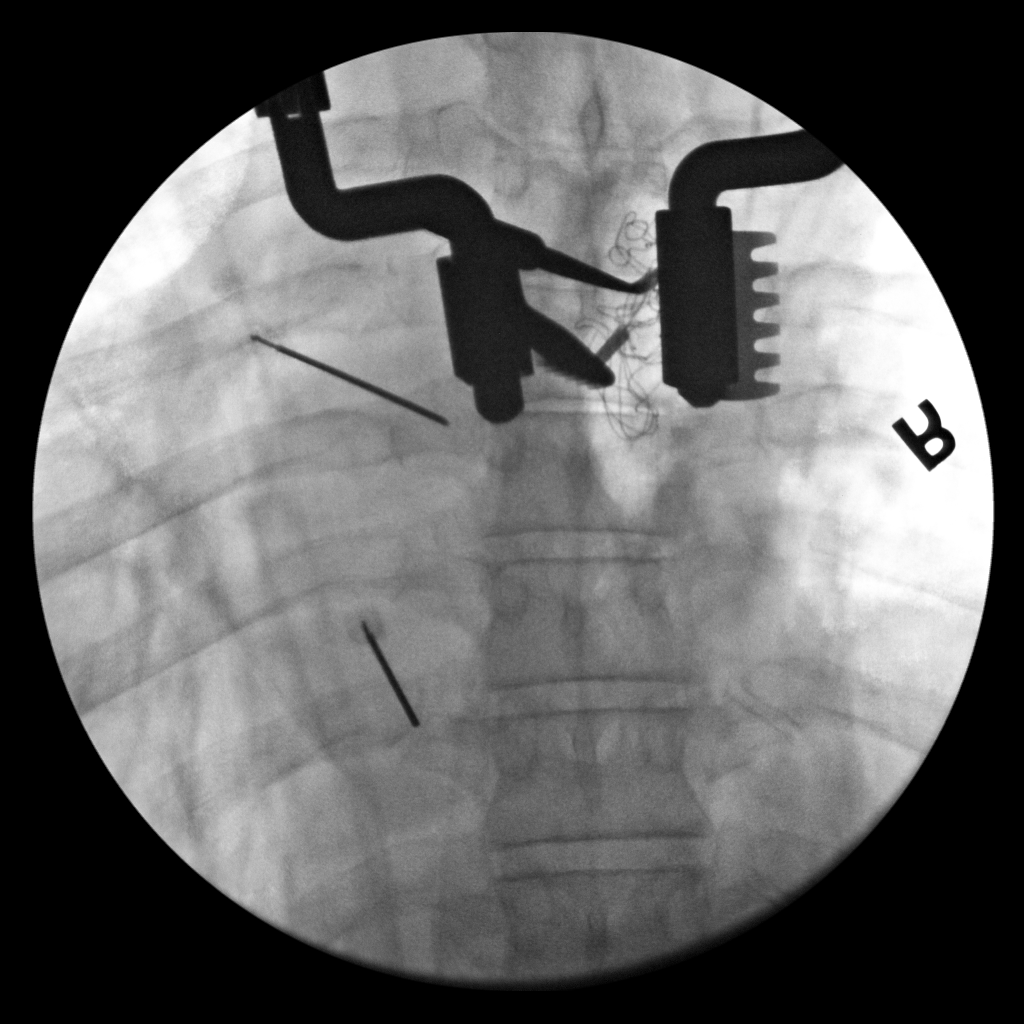
[im 2/2]
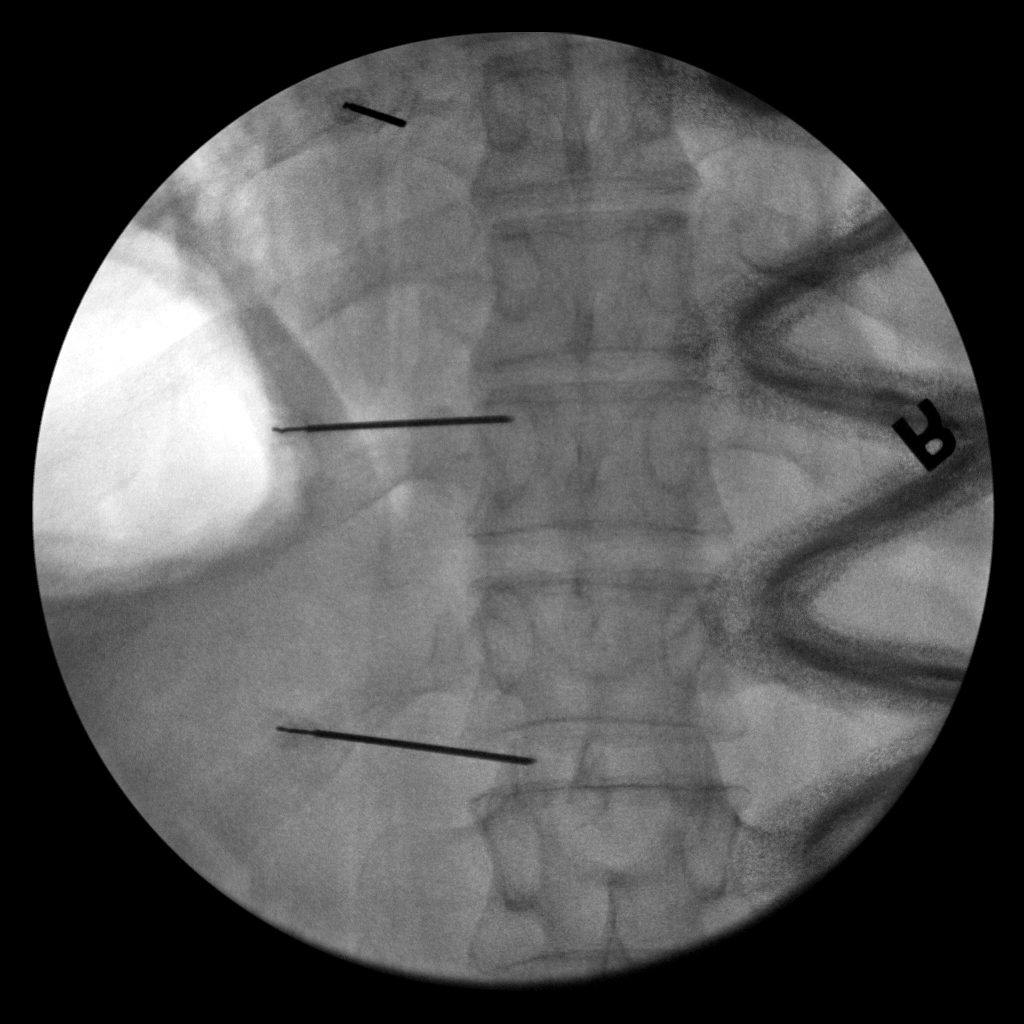

[2 of 2 positions shown; findings below may reference images not displayed]

FINDINGS: Two fluoroscopic images are obtained during performance of the
procedure and are provided for interpretation only. Images
demonstrate surgical instrumentation overlying the thoracic spine.
Exact level cannot be ascertained given limited field of view.
Please refer to operative report.

FLUOROSCOPY TIME:  13.6 seconds
IMPRESSION: 1. Intraoperative exam as above, please refer to operative report.

## 2019-07-01 IMAGING — RF DG THORACOLUMBAR SPINE 2V
1 series · 2 of 2 positions shown · non-contrast
Comparison: None.

CLINICAL DATA: T6-T7 laminectomy

EXAM:
THORACOLUMBAR SPINE 1V

[Series 1: run · 2 of 2 slices shown]
[im 1/2]
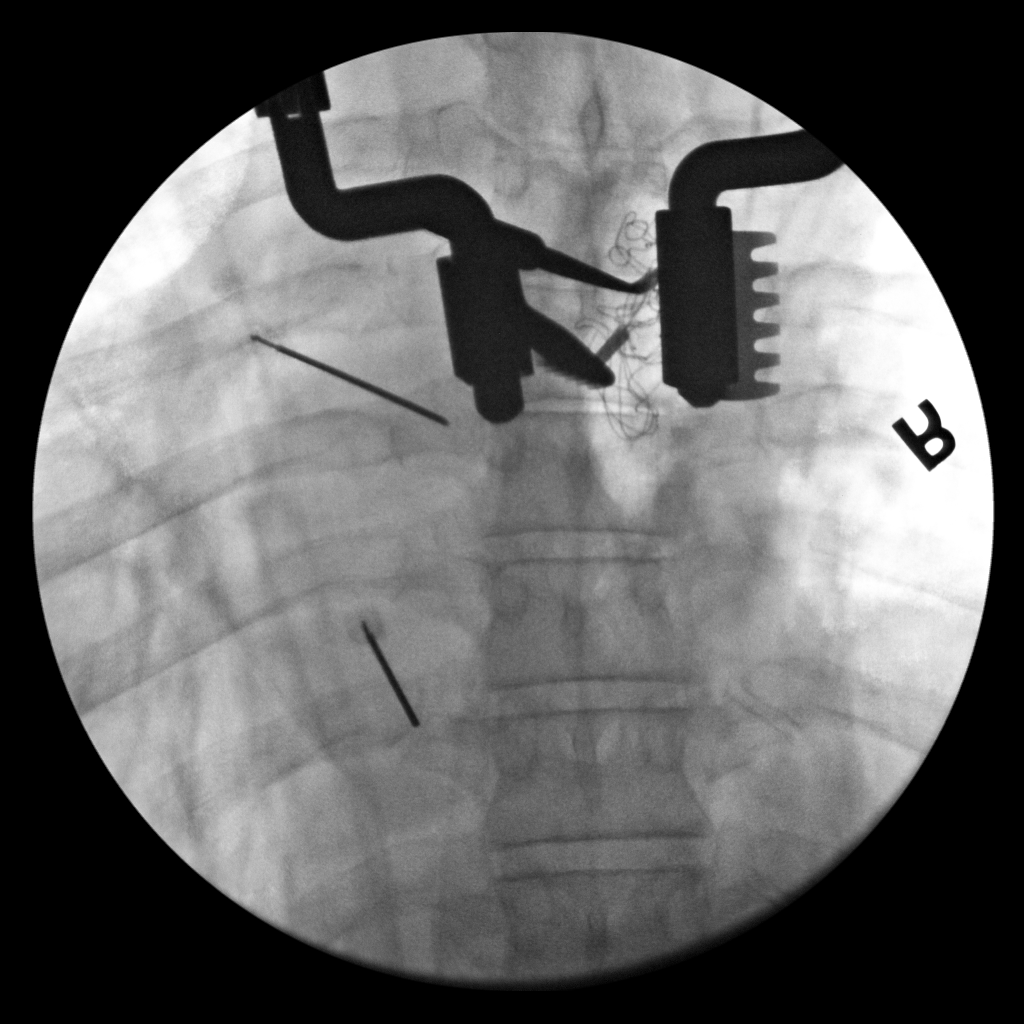
[im 2/2]
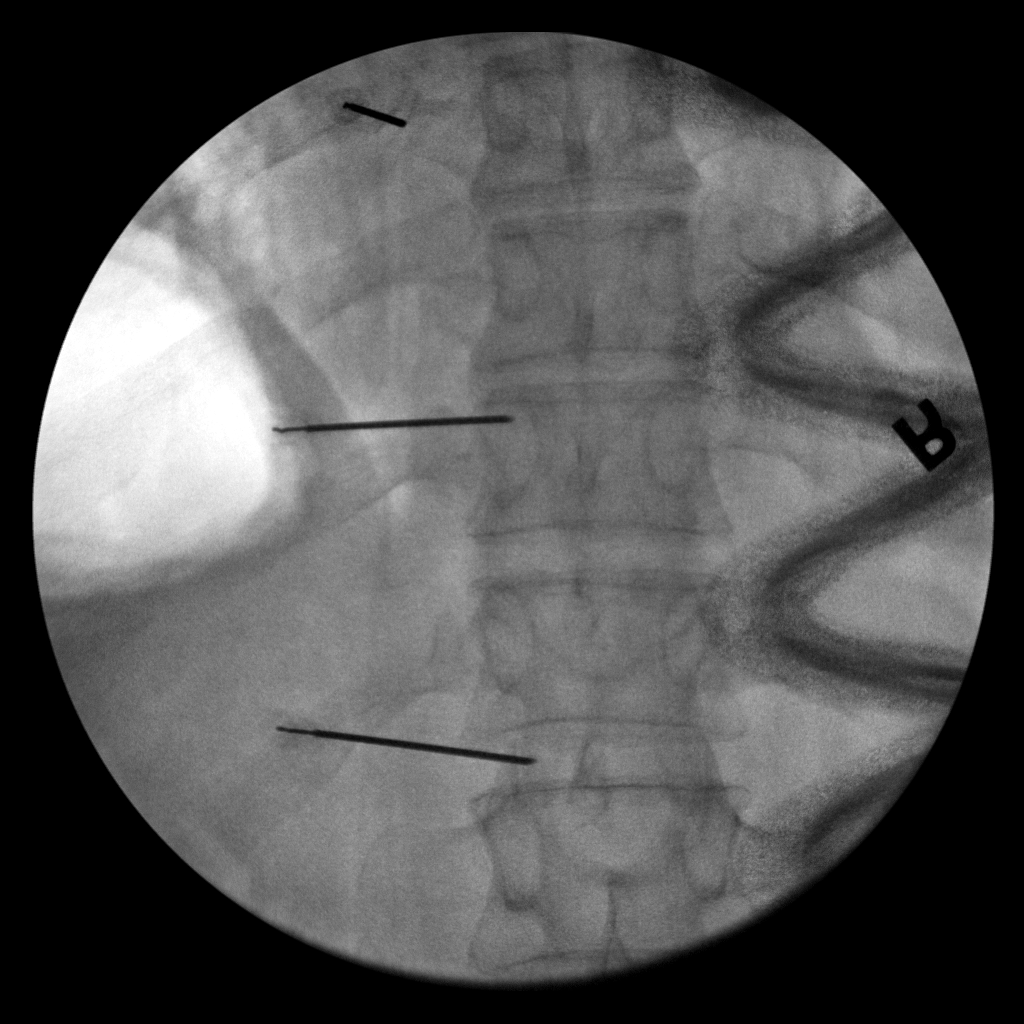

[2 of 2 positions shown; findings below may reference images not displayed]

FINDINGS: Two fluoroscopic images are obtained during performance of the
procedure and are provided for interpretation only. Images
demonstrate surgical instrumentation overlying the thoracic spine.
Exact level cannot be ascertained given limited field of view.
Please refer to operative report.

FLUOROSCOPY TIME:  13.6 seconds
IMPRESSION: 1. Intraoperative exam as above, please refer to operative report.

## 2019-07-01 SURGERY — LUMBAR LAMINECTOMY/DECOMPRESSION MICRODISCECTOMY 1 LEVEL
Anesthesia: General

## 2019-07-01 MED ORDER — ZOLPIDEM TARTRATE 5 MG PO TABS: 5.0000 mg | ORAL_TABLET | Freq: Every evening | ORAL | Status: DC | PRN

## 2019-07-01 MED ORDER — MENTHOL 3 MG MT LOZG: 1.0000 | LOZENGE | OROMUCOSAL | Status: DC | PRN

## 2019-07-01 MED ORDER — HYDROCODONE-ACETAMINOPHEN 5-325 MG PO TABS
1.0000 | ORAL_TABLET | ORAL | Status: DC | PRN
  Administered 2019-07-03 – 2019-07-07 (×2): 1 via ORAL
  Filled 2019-07-01 (×2): qty 1

## 2019-07-01 MED ORDER — DEXAMETHASONE SODIUM PHOSPHATE 10 MG/ML IJ SOLN
INTRAMUSCULAR | Status: AC
  Filled 2019-07-01: qty 1

## 2019-07-01 MED ORDER — ONDANSETRON HCL 4 MG PO TABS: 4.0000 mg | ORAL_TABLET | Freq: Four times a day (QID) | ORAL | Status: DC | PRN

## 2019-07-01 MED ORDER — SODIUM CHLORIDE 0.9 % IV SOLN
INTRAVENOUS | Status: DC | PRN
  Administered 2019-07-01: 500 mL

## 2019-07-01 MED ORDER — BUPIVACAINE HCL (PF) 0.5 % IJ SOLN
INTRAMUSCULAR | Status: AC
  Filled 2019-07-01: qty 30

## 2019-07-01 MED ORDER — ACETAMINOPHEN 500 MG PO TABS
1000.0000 mg | ORAL_TABLET | Freq: Four times a day (QID) | ORAL | Status: AC
  Administered 2019-07-01 – 2019-07-02 (×3): 1000 mg via ORAL
  Filled 2019-07-01 (×4): qty 2

## 2019-07-01 MED ORDER — PHENOL 1.4 % MT LIQD: 1.0000 | OROMUCOSAL | Status: DC | PRN

## 2019-07-01 MED ORDER — FENTANYL CITRATE (PF) 100 MCG/2ML IJ SOLN: 25.0000 ug | INTRAMUSCULAR | Status: DC | PRN

## 2019-07-01 MED ORDER — LACTATED RINGERS IV SOLN: INTRAVENOUS | Status: DC

## 2019-07-01 MED ORDER — SODIUM CHLORIDE 0.9% FLUSH
3.0000 mL | Freq: Two times a day (BID) | INTRAVENOUS | Status: DC
  Administered 2019-07-01 – 2019-07-02 (×2): 3 mL via INTRAVENOUS

## 2019-07-01 MED ORDER — SODIUM CHLORIDE 0.9 % IV SOLN: 250.0000 mL | INTRAVENOUS | Status: DC

## 2019-07-01 MED ORDER — ONDANSETRON HCL 4 MG/2ML IJ SOLN
INTRAMUSCULAR | Status: AC
  Filled 2019-07-01: qty 2

## 2019-07-01 MED ORDER — OXYCODONE HCL 5 MG/5ML PO SOLN: 5.0000 mg | Freq: Once | ORAL | Status: DC | PRN

## 2019-07-01 MED ORDER — DEXAMETHASONE SODIUM PHOSPHATE 10 MG/ML IJ SOLN
INTRAMUSCULAR | Status: DC | PRN
  Administered 2019-07-01 (×2): 5 mg via INTRAVENOUS

## 2019-07-01 MED ORDER — LIDOCAINE 2% (20 MG/ML) 5 ML SYRINGE
INTRAMUSCULAR | Status: DC | PRN
  Administered 2019-07-01: 100 mg via INTRAVENOUS

## 2019-07-01 MED ORDER — SODIUM CHLORIDE 0.9 % IV SOLN: INTRAVENOUS | Status: DC

## 2019-07-01 MED ORDER — ROCURONIUM BROMIDE 10 MG/ML (PF) SYRINGE
PREFILLED_SYRINGE | INTRAVENOUS | Status: DC | PRN
  Administered 2019-07-01: 50 mg via INTRAVENOUS
  Administered 2019-07-01 (×2): 20 mg via INTRAVENOUS

## 2019-07-01 MED ORDER — ONDANSETRON HCL 4 MG/2ML IJ SOLN: 4.0000 mg | Freq: Once | INTRAMUSCULAR | Status: DC | PRN

## 2019-07-01 MED ORDER — DOCUSATE SODIUM 100 MG PO CAPS
100.0000 mg | ORAL_CAPSULE | Freq: Two times a day (BID) | ORAL | Status: DC
  Administered 2019-07-01 – 2019-07-07 (×10): 100 mg via ORAL
  Filled 2019-07-01 (×10): qty 1

## 2019-07-01 MED ORDER — LIDOCAINE-EPINEPHRINE 1 %-1:100000 IJ SOLN
INTRAMUSCULAR | Status: AC
  Filled 2019-07-01: qty 1

## 2019-07-01 MED ORDER — LIDOCAINE-EPINEPHRINE 1 %-1:100000 IJ SOLN
INTRAMUSCULAR | Status: DC | PRN
  Administered 2019-07-01: 3.5 mL

## 2019-07-01 MED ORDER — PROPOFOL 10 MG/ML IV BOLUS
INTRAVENOUS | Status: AC
  Filled 2019-07-01: qty 20

## 2019-07-01 MED ORDER — THROMBIN 5000 UNITS EX SOLR
OROMUCOSAL | Status: DC | PRN
  Administered 2019-07-01: 5 mL via TOPICAL

## 2019-07-01 MED ORDER — SUGAMMADEX SODIUM 200 MG/2ML IV SOLN
INTRAVENOUS | Status: DC | PRN
  Administered 2019-07-01 (×2): 100 mg via INTRAVENOUS

## 2019-07-01 MED ORDER — 0.9 % SODIUM CHLORIDE (POUR BTL) OPTIME
TOPICAL | Status: DC | PRN
  Administered 2019-07-01: 1000 mL

## 2019-07-01 MED ORDER — FLEET ENEMA 7-19 GM/118ML RE ENEM: 1.0000 | ENEMA | Freq: Once | RECTAL | Status: DC | PRN

## 2019-07-01 MED ORDER — ONDANSETRON HCL 4 MG/2ML IJ SOLN
INTRAMUSCULAR | Status: DC | PRN
  Administered 2019-07-01: 4 mg via INTRAVENOUS

## 2019-07-01 MED ORDER — HYDROMORPHONE HCL 1 MG/ML IJ SOLN: 0.5000 mg | INTRAMUSCULAR | Status: DC | PRN

## 2019-07-01 MED ORDER — LIDOCAINE 2% (20 MG/ML) 5 ML SYRINGE
INTRAMUSCULAR | Status: AC
  Filled 2019-07-01: qty 5

## 2019-07-01 MED ORDER — GABAPENTIN 300 MG PO CAPS
300.0000 mg | ORAL_CAPSULE | Freq: Three times a day (TID) | ORAL | Status: DC
  Administered 2019-07-01 – 2019-07-07 (×17): 300 mg via ORAL
  Filled 2019-07-01 (×18): qty 1

## 2019-07-01 MED ORDER — SODIUM CHLORIDE 0.9% FLUSH: 3.0000 mL | INTRAVENOUS | Status: DC | PRN

## 2019-07-01 MED ORDER — SENNOSIDES-DOCUSATE SODIUM 8.6-50 MG PO TABS: 1.0000 | ORAL_TABLET | Freq: Every evening | ORAL | Status: DC | PRN

## 2019-07-01 MED ORDER — OXYCODONE HCL 5 MG PO TABS: 5.0000 mg | ORAL_TABLET | Freq: Once | ORAL | Status: DC | PRN

## 2019-07-01 MED ORDER — SENNA 8.6 MG PO TABS
1.0000 | ORAL_TABLET | Freq: Two times a day (BID) | ORAL | Status: DC
  Administered 2019-07-01 – 2019-07-07 (×8): 8.6 mg via ORAL
  Filled 2019-07-01 (×9): qty 1

## 2019-07-01 MED ORDER — ACETAMINOPHEN 650 MG RE SUPP: 650.0000 mg | RECTAL | Status: DC | PRN

## 2019-07-01 MED ORDER — SUCCINYLCHOLINE CHLORIDE 200 MG/10ML IV SOSY
PREFILLED_SYRINGE | INTRAVENOUS | Status: AC
  Filled 2019-07-01: qty 10

## 2019-07-01 MED ORDER — ACETAMINOPHEN 325 MG PO TABS: 650.0000 mg | ORAL_TABLET | ORAL | Status: DC | PRN

## 2019-07-01 MED ORDER — SODIUM CHLORIDE 0.9% IV SOLUTION: Freq: Once | INTRAVENOUS | Status: DC

## 2019-07-01 MED ORDER — PHENYLEPHRINE 40 MCG/ML (10ML) SYRINGE FOR IV PUSH (FOR BLOOD PRESSURE SUPPORT)
PREFILLED_SYRINGE | INTRAVENOUS | Status: AC
  Filled 2019-07-01: qty 10

## 2019-07-01 MED ORDER — ACETAMINOPHEN 10 MG/ML IV SOLN
INTRAVENOUS | Status: DC | PRN
Start: 2019-07-01 — End: 2019-07-01
  Administered 2019-07-01: 1000 mg via INTRAVENOUS

## 2019-07-01 MED ORDER — FENTANYL CITRATE (PF) 250 MCG/5ML IJ SOLN
INTRAMUSCULAR | Status: AC
  Filled 2019-07-01: qty 5

## 2019-07-01 MED ORDER — OXYCODONE HCL 5 MG PO TABS
5.0000 mg | ORAL_TABLET | ORAL | Status: DC | PRN
  Administered 2019-07-03 – 2019-07-06 (×6): 10 mg via ORAL
  Filled 2019-07-01 (×6): qty 2

## 2019-07-01 MED ORDER — BUPIVACAINE HCL (PF) 0.5 % IJ SOLN
INTRAMUSCULAR | Status: DC | PRN
  Administered 2019-07-01: 3.5 mL

## 2019-07-01 MED ORDER — CEFAZOLIN SODIUM-DEXTROSE 2-4 GM/100ML-% IV SOLN
2.0000 g | Freq: Three times a day (TID) | INTRAVENOUS | Status: AC
  Administered 2019-07-01 – 2019-07-02 (×2): 2 g via INTRAVENOUS
  Filled 2019-07-01 (×2): qty 100

## 2019-07-01 MED ORDER — SODIUM CHLORIDE 0.9 % IV BOLUS
1000.0000 mL | Freq: Once | INTRAVENOUS | Status: AC
  Administered 2019-07-01: 1000 mL via INTRAVENOUS

## 2019-07-01 MED ORDER — ACETAMINOPHEN 10 MG/ML IV SOLN
INTRAVENOUS | Status: AC
  Filled 2019-07-01: qty 100

## 2019-07-01 MED ORDER — PHENYLEPHRINE 40 MCG/ML (10ML) SYRINGE FOR IV PUSH (FOR BLOOD PRESSURE SUPPORT)
PREFILLED_SYRINGE | INTRAVENOUS | Status: DC | PRN
  Administered 2019-07-01: 80 ug via INTRAVENOUS

## 2019-07-01 MED ORDER — THROMBIN 5000 UNITS EX SOLR
CUTANEOUS | Status: AC
  Filled 2019-07-01: qty 15000

## 2019-07-01 MED ORDER — ROCURONIUM BROMIDE 10 MG/ML (PF) SYRINGE
PREFILLED_SYRINGE | INTRAVENOUS | Status: AC
  Filled 2019-07-01: qty 10

## 2019-07-01 MED ORDER — FENTANYL CITRATE (PF) 250 MCG/5ML IJ SOLN
INTRAMUSCULAR | Status: DC | PRN
  Administered 2019-07-01 (×3): 50 ug via INTRAVENOUS
  Administered 2019-07-01: 100 ug via INTRAVENOUS

## 2019-07-01 MED ORDER — BISACODYL 10 MG RE SUPP
10.0000 mg | Freq: Every day | RECTAL | Status: DC | PRN
  Administered 2019-07-04: 10 mg via RECTAL
  Filled 2019-07-01: qty 1

## 2019-07-01 MED ORDER — PROPOFOL 10 MG/ML IV BOLUS
INTRAVENOUS | Status: DC | PRN
  Administered 2019-07-01: 160 mg via INTRAVENOUS

## 2019-07-01 MED ORDER — METHYLPREDNISOLONE ACETATE 80 MG/ML IJ SUSP
INTRAMUSCULAR | Status: AC
  Filled 2019-07-01: qty 1

## 2019-07-01 MED ORDER — METHOCARBAMOL 500 MG PO TABS: 500.0000 mg | ORAL_TABLET | Freq: Four times a day (QID) | ORAL | Status: DC | PRN

## 2019-07-01 MED ORDER — METHOCARBAMOL 1000 MG/10ML IJ SOLN
500.0000 mg | Freq: Four times a day (QID) | INTRAVENOUS | Status: DC | PRN
  Filled 2019-07-01: qty 5

## 2019-07-01 MED ORDER — SUCCINYLCHOLINE CHLORIDE 200 MG/10ML IV SOSY
PREFILLED_SYRINGE | INTRAVENOUS | Status: DC | PRN
  Administered 2019-07-01: 80 mg via INTRAVENOUS

## 2019-07-01 MED ORDER — ONDANSETRON HCL 4 MG/2ML IJ SOLN
4.0000 mg | Freq: Four times a day (QID) | INTRAMUSCULAR | Status: DC | PRN
  Administered 2019-07-01 – 2019-07-06 (×3): 4 mg via INTRAVENOUS
  Filled 2019-07-01 (×2): qty 2

## 2019-07-01 SURGICAL SUPPLY — 60 items
BAG DECANTER FOR FLEXI CONT (MISCELLANEOUS) ×3 IMPLANT
BAND RUBBER #18 3X1/16 STRL (MISCELLANEOUS) ×6 IMPLANT
BENZOIN TINCTURE PRP APPL 2/3 (GAUZE/BANDAGES/DRESSINGS) IMPLANT
BLADE CLIPPER SURG (BLADE) IMPLANT
BLADE SURG 11 STRL SS (BLADE) ×3 IMPLANT
BUR MATCHSTICK NEURO 3.0 LAGG (BURR) ×3 IMPLANT
BUR PRECISION FLUTE 5.0 (BURR) ×3 IMPLANT
CANISTER SUCT 3000ML PPV (MISCELLANEOUS) ×3 IMPLANT
CARTRIDGE OIL MAESTRO DRILL (MISCELLANEOUS) ×1 IMPLANT
CLOSURE WOUND 1/2 X4 (GAUZE/BANDAGES/DRESSINGS)
COVER WAND RF STERILE (DRAPES) IMPLANT
DECANTER SPIKE VIAL GLASS SM (MISCELLANEOUS) ×3 IMPLANT
DERMABOND ADVANCED (GAUZE/BANDAGES/DRESSINGS) ×2
DERMABOND ADVANCED .7 DNX12 (GAUZE/BANDAGES/DRESSINGS) ×1 IMPLANT
DIFFUSER DRILL AIR PNEUMATIC (MISCELLANEOUS) ×3 IMPLANT
DRAPE C-ARM 42X72 X-RAY (DRAPES) ×6 IMPLANT
DRAPE LAPAROTOMY 100X72X124 (DRAPES) ×3 IMPLANT
DRAPE MICROSCOPE LEICA (MISCELLANEOUS) ×3 IMPLANT
DRAPE SURG 17X23 STRL (DRAPES) ×3 IMPLANT
DRSG OPSITE POSTOP 3X4 (GAUZE/BANDAGES/DRESSINGS) ×3 IMPLANT
DRSG OPSITE POSTOP 4X6 (GAUZE/BANDAGES/DRESSINGS) ×3 IMPLANT
DURAPREP 26ML APPLICATOR (WOUND CARE) ×3 IMPLANT
ELECT REM PT RETURN 9FT ADLT (ELECTROSURGICAL) ×3
ELECTRODE REM PT RTRN 9FT ADLT (ELECTROSURGICAL) ×1 IMPLANT
FORCEPS BIPOLAR SPETZLER 8 1.0 (NEUROSURGERY SUPPLIES) ×3 IMPLANT
GAUZE 4X4 16PLY RFD (DISPOSABLE) IMPLANT
GAUZE SPONGE 4X4 12PLY STRL (GAUZE/BANDAGES/DRESSINGS) IMPLANT
GLOVE BIO SURGEON STRL SZ7.5 (GLOVE) IMPLANT
GLOVE BIOGEL PI IND STRL 7.5 (GLOVE) ×2 IMPLANT
GLOVE BIOGEL PI INDICATOR 7.5 (GLOVE) ×4
GLOVE ECLIPSE 7.0 STRL STRAW (GLOVE) ×3 IMPLANT
GLOVE EXAM NITRILE XL STR (GLOVE) IMPLANT
GOWN STRL REUS W/ TWL LRG LVL3 (GOWN DISPOSABLE) ×2 IMPLANT
GOWN STRL REUS W/ TWL XL LVL3 (GOWN DISPOSABLE) IMPLANT
GOWN STRL REUS W/TWL 2XL LVL3 (GOWN DISPOSABLE) IMPLANT
GOWN STRL REUS W/TWL LRG LVL3 (GOWN DISPOSABLE) ×6
GOWN STRL REUS W/TWL XL LVL3 (GOWN DISPOSABLE)
HEMOSTAT POWDER KIT SURGIFOAM (HEMOSTASIS) ×3 IMPLANT
KIT BASIN OR (CUSTOM PROCEDURE TRAY) ×3 IMPLANT
KIT TURNOVER KIT B (KITS) ×3 IMPLANT
NEEDLE HYPO 18GX1.5 BLUNT FILL (NEEDLE) IMPLANT
NEEDLE HYPO 22GX1.5 SAFETY (NEEDLE) ×3 IMPLANT
NEEDLE SPNL 18GX3.5 QUINCKE PK (NEEDLE) ×12 IMPLANT
NS IRRIG 1000ML POUR BTL (IV SOLUTION) ×3 IMPLANT
OIL CARTRIDGE MAESTRO DRILL (MISCELLANEOUS) ×3
PACK LAMINECTOMY NEURO (CUSTOM PROCEDURE TRAY) ×3 IMPLANT
PAD ARMBOARD 7.5X6 YLW CONV (MISCELLANEOUS) ×9 IMPLANT
SPONGE LAP 4X18 RFD (DISPOSABLE) IMPLANT
SPONGE SURGIFOAM ABS GEL SZ50 (HEMOSTASIS) ×3 IMPLANT
STRIP CLOSURE SKIN 1/2X4 (GAUZE/BANDAGES/DRESSINGS) IMPLANT
SUT SILK 2 0 TIES 10X30 (SUTURE) ×3 IMPLANT
SUT VIC AB 0 CT1 18XCR BRD8 (SUTURE) ×2 IMPLANT
SUT VIC AB 0 CT1 8-18 (SUTURE) ×6
SUT VIC AB 2-0 CT1 18 (SUTURE) IMPLANT
SUT VICRYL 3-0 RB1 18 ABS (SUTURE) ×6 IMPLANT
SYR 3ML LL SCALE MARK (SYRINGE) IMPLANT
TOWEL GREEN STERILE (TOWEL DISPOSABLE) ×3 IMPLANT
TOWEL GREEN STERILE FF (TOWEL DISPOSABLE) ×3 IMPLANT
TRAY FOLEY MTR SLVR 16FR STAT (SET/KITS/TRAYS/PACK) ×3 IMPLANT
WATER STERILE IRR 1000ML POUR (IV SOLUTION) ×3 IMPLANT

## 2019-07-01 NOTE — Progress Notes (Signed)
PT Cancellation Note  Patient Details Name: Timothy Davenport MRN: [id number] DOB: 03-Aug-1950   Cancelled Treatment:    Reason Eval/Treat Not Completed: Medical issues which prohibited therapy. Pt with planned spinal surgery this afternoon due to large disc extrusion at T6-7 with severe spinal cord compression per chart review. PT will hold until surgery is complete and pt is medically ready to participate in PT intervention.   Zenaida Niece 07/01/2019, 7:57 AM

## 2019-07-01 NOTE — Anesthesia Preprocedure Evaluation (Signed)
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 negative psych ROS   GI/Hepatic Neg liver ROS, GERD  ,  Endo/Other  diabetes, Oral Hypoglycemic Agents  Renal/GU negative Renal ROS  negative genitourinary   Musculoskeletal negative musculoskeletal ROS (+)   Abdominal   Peds  Hematology Plavix, last dose 06/30/19   Anesthesia Other Findings   Reproductive/Obstetrics                           Anesthesia Physical Anesthesia Plan  ASA: III and emergent  Anesthesia Plan: General   Post-op Pain Management:    Induction: Intravenous  PONV Risk Score and Plan: 2 and Ondansetron, Dexamethasone, Treatment may vary due to age or medical condition and Midazolam  Airway Management Planned: Oral ETT  Additional Equipment: None  Intra-op Plan:   Post-operative Plan: Extubation in OR  Informed Consent: I have reviewed the patients History and Physical, chart, labs and discussed the procedure including the risks, benefits and alternatives for the proposed anesthesia with the patient or authorized representative who has indicated his/her understanding and acceptance.     Dental advisory given  Plan Discussed with:    Anesthesia Plan Comments:       Anesthesia Quick Evaluation

## 2019-07-01 NOTE — Progress Notes (Signed)
  NEUROSURGERY PROGRESS NOTE   No issues overnight. History reviewed with pt. Presented after fall with several months of progressively worsening gait and primarily RLE weakness. No c/o N/T or bladder dysfunction.   EXAM:  BP (!) 136/93 (BP Location: Left Arm)   Pulse 85   Temp 98 F (36.7 C) (Oral)   Resp 14   Ht 5\' 11"  (1.803 m)   Wt 79.8 kg   SpO2 99%   BMI 24.55 kg/m   Awake, alert, oriented  Speech fluent, appropriate  CN grossly intact  1-2/5 proximal RLE, 1/5 distal RLE 3/5 IP, 4-/5 Quad, 4/5 PF/DF  IMPRESSION:  69 y.o. 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 - Platelet transfusion preop with hx of daily 75mg  Plavix.  I have reviewed the imaging findings with the patient. I gave him my recommendation for surgery as the best chance for functional improvement. We discussed the details of surgery and the expected postop course including the likelihood of postoperative rehab/therapy. Risks of surgery were discussed including spinal cord injury leading to worsening weakness/bladder dysfunction, bleeding, infection, and CSF leak. General risks of anesthesia were also discussed including heart attack, stroke, and blood clots. All his questions were answered and he provided consent to proceed.

## 2019-07-01 NOTE — Transfer of Care (Signed)
Immediate Anesthesia Transfer of Care Note  Patient: Timothy Davenport  Procedure(s) Performed: LUMBAR LAMINECTOMY/DECOMPRESSION MICRODISCECTOMY 1 LEVEL, THORACIC SIX-SEVEN (N/A )  Patient Location: PACU  Anesthesia Type:General  Level of Consciousness: drowsy  Airway & Oxygen Therapy: Patient Spontanous Breathing and Patient connected to nasal cannula oxygen  Post-op Assessment: Report given to RN and Post -op Vital signs reviewed and stable  Post vital signs: Reviewed and stable  Last Vitals:  Vitals Value Taken Time  BP    Temp    Pulse 80 07/01/19 1700  Resp 19 07/01/19 1700  SpO2 99 % 07/01/19 1700  Vitals shown include unvalidated device data.  Last Pain:  Vitals:   07/01/19 1306  TempSrc: Oral  PainSc:       Patients Stated Pain Goal: 2 ([id number])  Complications: No apparent anesthesia complications

## 2019-07-01 NOTE — Progress Notes (Signed)
OT Cancellation Note  Patient Details Name: Timothy Davenport MRN: [id number] DOB: Nov 07, 1950   Cancelled Treatment:    Reason Eval/Treat Not Completed: Patient at procedure or test/ unavailable(large disc extrusion at T6-7 with severe spinal cord compression)Pt with surgery planned for today and will hold until after surgery instructions.   Billey Chang, OTR/L  Acute Rehabilitation Services Pager: [phone] Office: [phone] .  07/01/2019, 8:57 AM

## 2019-07-01 NOTE — Progress Notes (Signed)
Patient arrived to unit at 2000. CHG bath given, vitals and MRSA swab obtained, and patient oriented to unit. Call bell placed within reach and bed alarm set.

## 2019-07-01 NOTE — Op Note (Signed)
NEUROSURGERY OPERATIVE NOTE   PREOP DIAGNOSIS:  1. T6-7 Disc Herniation with myelopathy   POSTOP DIAGNOSIS: Same  PROCEDURE: 1. T6, T7 laminectomy, right T6 and T7 pediculotomy for microdiscectomy at T6-7 2. Using intraoperative ultrasound  SURGEON: Dr. Consuella Lose, MD  ASSISTANT: Ferne Reus, PA-C  ANESTHESIA: General Endotracheal  EBL: 150cc  SPECIMENS: None  DRAINS: None  COMPLICATIONS: None immediate  CONDITION: Hemodynamically stable to PACU  HISTORY: Timothy Davenport is a 69 y.o. male initially presented to the emergency department with several months of progressive right-sided weakness and gait instability with acute left leg weakness after a fall.  Upon his presentation exam revealed significant bilateral lower extremity weakness with hyperreflexia suggestive of thoracic myelopathy.  MRI confirmed relatively large central disc herniation at T6-7 with significant spinal cord compression.  Surgical decompression was therefore indicated.  The risks, benefits, and alternatives to surgery as well as the expected postoperative course and recovery were all discussed in detail with the patient.  After all questions were answered informed consent was obtained and witnessed.  PROCEDURE IN DETAIL: The patient was brought to the operating room. After induction of general anesthesia, the patient was positioned on the operative table in the prone position. All pressure points were meticulously padded. Skin incision was then marked out and prepped and draped in the usual sterile fashion.  After timeout was conducted, multiple spinal needles were introduced in order to count levels up from the sacrum to the T6 and T7 vertebral bodies.  Midline skin incision was then infiltrated with local anesthetic with epinephrine.  Incision was made sharply and carried down through subcutaneous tissue.  The thoracodorsal fascia was then identified and incised.  Subperiosteal dissection was  carried out along the T6 and T7 vertebral bodies..  A dissector was placed along the right T6 pedicle, and location was confirmed with intraoperative AP fluoroscopy again counting from the 12th rib.  At this point the spinous process at T6 and T7 were removed.  Laminectomy at T6 and T7 was then completed with a high-speed drill.  The thecal sac was identified after removal of the ligamentum flavum.  At this point the ultrasound was brought into the field sterilely.  The wound was flooded with normal saline irrigation.  Ultrasound was used to identify the spinal cord, with indentation and deformation of the spinal cord due to the ventral disc rupture indicating we were in fact at the correct level.    High-speed drill was then used to cut across the pars interarticularis of T7 on the right.  The right T7 nerve root was then identified.  This defined the inferior border of the right T7 pedicle.  Similarly, the inferior articulating process of T6 was removed and the right T6 nerve root was also identified.  Using a Leksell rongeur, the transverse process of T7 was then removed.  The high-speed drill was then used to decancellate the pedicle.  The cortical walls of the pedicle were then removed with rongeurs.  High-speed drill was also used to remove the superior articulating process of T7 on the right.  At this point, the ball-tipped dissector was used to dissect in the ventral epidural space.  I was able to palpate a relatively large disc bulge at the level of the T6-7 disc space and slightly superior, behind the T6 vertebral body.  The right T6 nerve root did appear to be in the middle of the cord or allowing decompression.  I therefore elected to ligate the T6 nerve  root.  Using 2 2-0 silk ligatures, the nerve root was tied off and divided.  High-speed drill was then used to remove the posterior portion of the T6 and T7 vertebral bodies on the right.  Multiple down angled curettes were then used to remove the  herniated disc fragment which appeared to be calcified.  Once all the disc fragments were removed, I was then able to freely pass a long ball-tipped dissector in the ventral epidural space.  At this point, the ultrasound was brought back into the field and the field was flooded with saline irrigation.  Ultrasound confirmed removal of the large disc herniation which was seen prior to decompression.  The spinal cord also appeared to have reexpanded somewhat.  We then turned our attention to hemostasis.  Hemostasis on the vertebral bodies and the epidural space was achieved with morselized Gelfoam with thrombin.  The wound was then irrigated with copious amounts of normal saline irrigation.  Good hemostasis on the muscular edges was achieved with bipolar electrocautery.  Self-retaining retractors were then removed and the muscle was reinspected, without any active bleeding identified.  The wound was then closed in multiple layers with interrupted 0 and 3-0 Vicryl stitches.  Skin was closed with interrupted subcuticular 3-0 stitches.  Dermabond was then placed and allowed to dry.  Sterile dressing was then applied.  At the end of the case all sponge, needle, instrument, and cottonoid counts were correct.  The patient was then transferred to the stretcher, extubated, and taken to the postanesthesia care unit in stable hemodynamic condition.

## 2019-07-01 NOTE — Anesthesia Procedure Notes (Signed)
Procedure Name: Intubation Date/Time: 07/01/2019 1:38 PM Performed by: Trinna Post., CRNA Pre-anesthesia Checklist: Patient identified, Emergency Drugs available, Suction available, Patient being monitored and Timeout performed Patient Re-evaluated:Patient Re-evaluated prior to induction Oxygen Delivery Method: Circle system utilized Preoxygenation: Pre-oxygenation with 100% oxygen Induction Type: IV induction, Rapid sequence and Cricoid Pressure applied Laryngoscope Size: Mac and 4 Grade View: Grade I Tube type: Oral Tube size: 7.5 mm Number of attempts: 1 Airway Equipment and Method: Stylet Placement Confirmation: ETT inserted through vocal cords under direct vision,  positive ETCO2 and breath sounds checked- equal and bilateral Secured at: 22 cm Tube secured with: Tape Dental Injury: Teeth and Oropharynx as per pre-operative assessment

## 2019-07-01 NOTE — Progress Notes (Signed)
BP 84/47 at 2005; patient experiencing no symptoms of dizziness/lightheadedness. RN spoke with on-call provider who ordered 1L NS bolus and CBC and BMET labs. After bolus administration, BP increased to 111/83. Nightly hydralazine held. Hgb result of 12.8. Patient resting in bed.

## 2019-07-01 NOTE — Discharge Planning (Signed)
Guadelupe Sabin from [facility] called regarding pt discharge plans [phone]

## 2019-07-01 NOTE — Progress Notes (Signed)
Pt left unit to go to short stay for surgery prep, escorted by transport and Leonidas Romberg, RN. Short stay RN agreed to give platelets downstairs since unit was not ready to start here. Blood sugar taken at 148 prior to pt leaving unit.

## 2019-07-02 ENCOUNTER — Inpatient Hospital Stay (HOSPITAL_COMMUNITY): Payer: No Typology Code available for payment source

## 2019-07-02 LAB — BASIC METABOLIC PANEL
Anion gap: 11 (ref 5–15)
BUN: 21 mg/dL (ref 8–23)
CO2: 23 mmol/L (ref 22–32)
Calcium: 9.7 mg/dL (ref 8.9–10.3)
Chloride: 107 mmol/L (ref 98–111)
Creatinine, Ser: 1.36 mg/dL — ABNORMAL HIGH (ref 0.61–1.24)
GFR calc Af Amer: >60 mL/min (ref >60)
GFR calc non Af Amer: 52 mL/min — ABNORMAL LOW (ref >60)
Glucose, Bld: 159 mg/dL — ABNORMAL HIGH (ref 70–99)
Potassium: 3.1 mmol/L — ABNORMAL LOW (ref 3.5–5.1)
Sodium: 141 mmol/L (ref 135–145)

## 2019-07-02 LAB — PREPARE PLATELET PHERESIS: Unit division: 0

## 2019-07-02 LAB — APTT: aPTT: 23 seconds — ABNORMAL LOW (ref 24–36)

## 2019-07-02 LAB — BPAM PLATELET PHERESIS
Blood Product Expiration Date: 202105132359
ISSUE DATE / TIME: 202105121247
Unit Type and Rh: 600

## 2019-07-02 LAB — CBC
HCT: 35.5 % — ABNORMAL LOW (ref 39.0–52.0)
Hemoglobin: 11.9 g/dL — ABNORMAL LOW (ref 13.0–17.0)
MCH: 28.3 pg (ref 26.0–34.0)
MCHC: 33.5 g/dL (ref 30.0–36.0)
MCV: 84.5 fL (ref 80.0–100.0)
Platelets: 156 10*3/uL (ref 150–400)
RBC: 4.2 MIL/uL — ABNORMAL LOW (ref 4.22–5.81)
RDW: 15.5 % (ref 11.5–15.5)
WBC: 11.8 10*3/uL — ABNORMAL HIGH (ref 4.0–10.5)
nRBC: 0 % (ref 0.0–0.2)

## 2019-07-02 LAB — PROTIME-INR
INR: 1.1 (ref 0.8–1.2)
Prothrombin Time: 13.6 seconds (ref 11.4–15.2)

## 2019-07-02 LAB — GLUCOSE, CAPILLARY
Glucose-Capillary: 124 mg/dL — ABNORMAL HIGH (ref 70–99)
Glucose-Capillary: 189 mg/dL — ABNORMAL HIGH (ref 70–99)
Glucose-Capillary: 202 mg/dL — ABNORMAL HIGH (ref 70–99)
Glucose-Capillary: 116 mg/dL — ABNORMAL HIGH (ref 70–99)

## 2019-07-02 IMAGING — MR MR THORACIC SPINE WO/W CM
6 of 15 series · 18 of 48 positions shown · IV contrast (20 MH)
Comparison: MRI thoracic spine [DATE].

CLINICAL DATA: Patient status post T6-7 laminectomy and
decompression [DATE]. Worsening motor function this morning with
preserved sensation.

EXAM:
MRI THORACIC WITHOUT AND WITH CONTRAST
TECHNIQUE: Multiplanar and multiecho pulse sequences of the thoracic spine were
obtained without and with intravenous contrast.
CONTRAST:  7.5 mL GADAVIST IV SOLN

[Series 2: T1 · sagittal · 3.0mm · 0.90mm/px · 1 of 12 slices shown (1 of 3)]
[im 1/12]
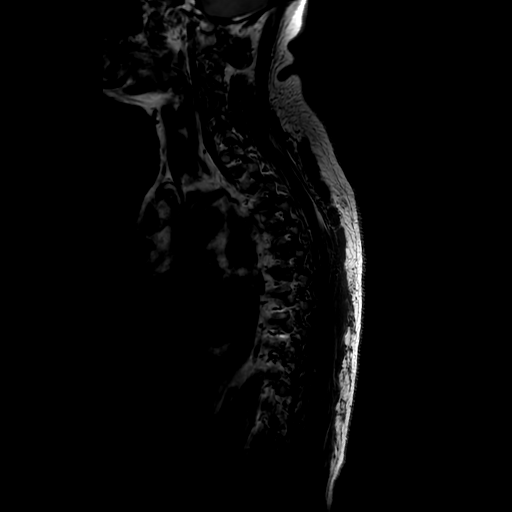

[Series 3: T2 · sagittal · 3.0mm · 0.66mm/px · 1 of 15 slices shown (1 of 3)]
[im 1/15]
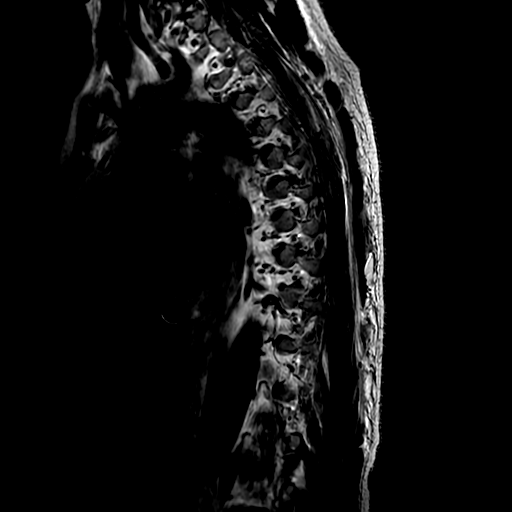

[Series 5: T1 · sagittal · 3.0mm · 0.66mm/px · 2 of 15 slices shown (2 of 3)]
[im 1/15]
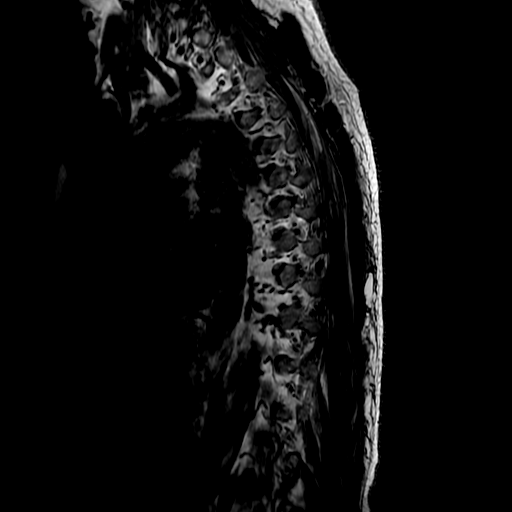
[im 15/15]
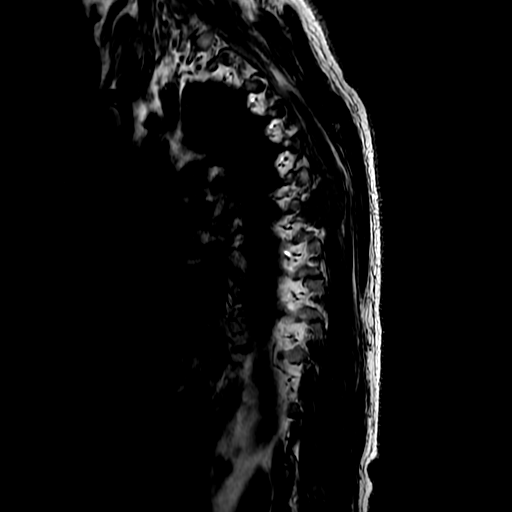

[Series 8: T2 · axial · 4.0mm · 0.39mm/px · z∈[-202,-71]mm · 4 of 25 slices shown (2 of 3)]
[im 1/25]
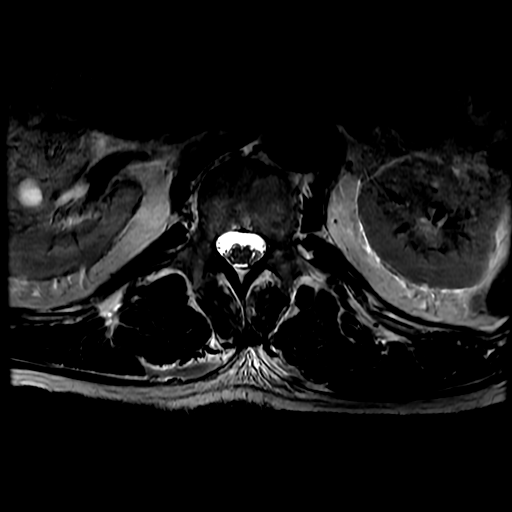
[im 9/25]
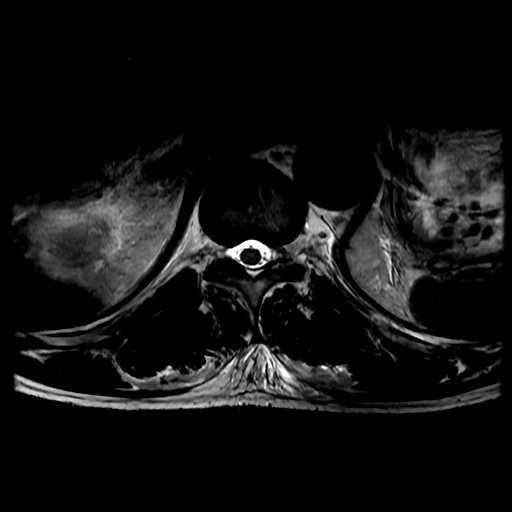
[im 17/25]
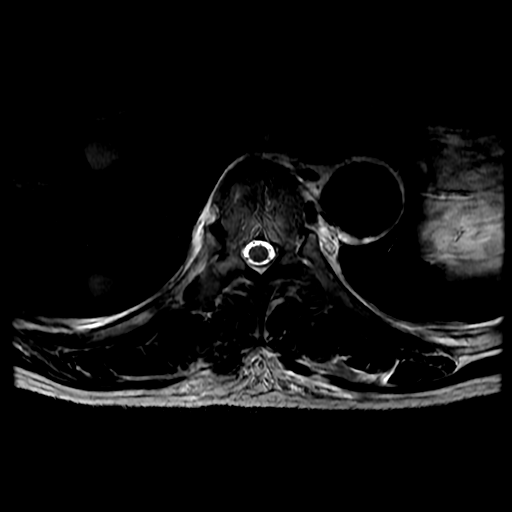
[im 25/25]
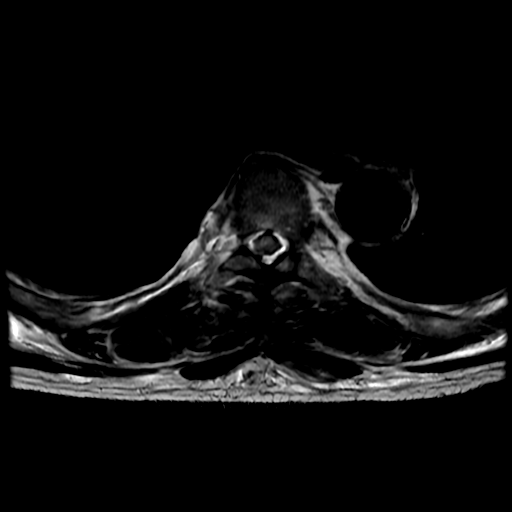

[Series 10: T2 · axial · 4.0mm · 0.39mm/px · z∈[-110,+64]mm · 5 of 33 slices shown (3 of 3)]
[im 1/33]
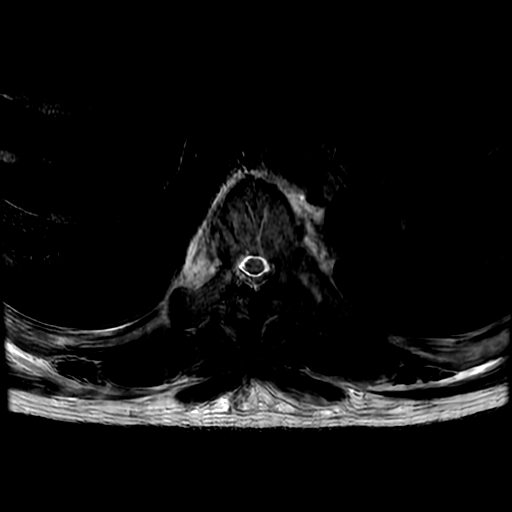
[im 9/33]
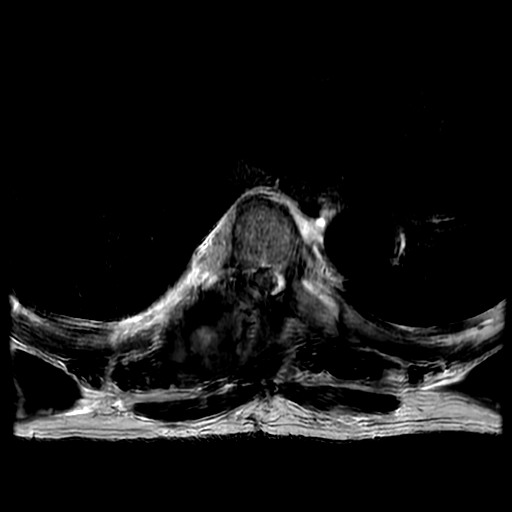
[im 17/33]
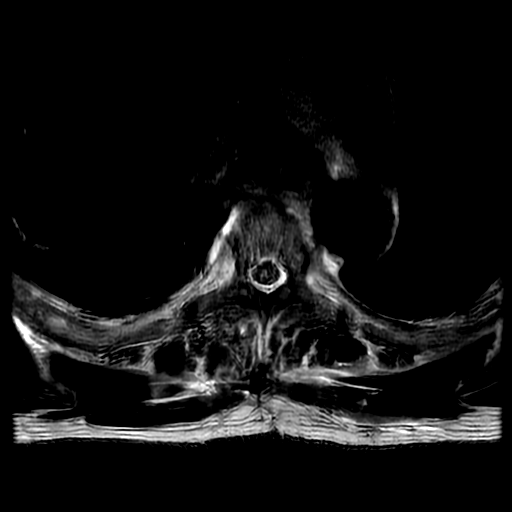
[im 25/33]
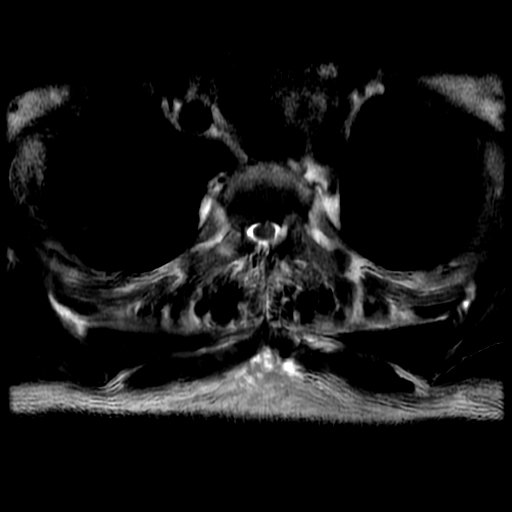
[im 33/33]
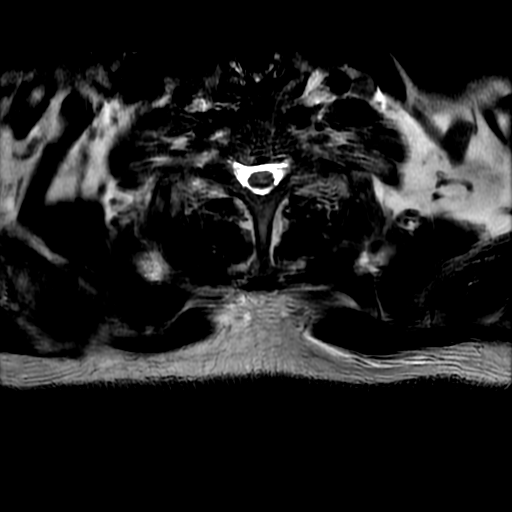

[Series 11: T1 · axial · non-contrast · 4.0mm · 0.39mm/px · z∈[-110,+64]mm · 5 of 33 slices shown (3 of 3)]
[im 1/33]
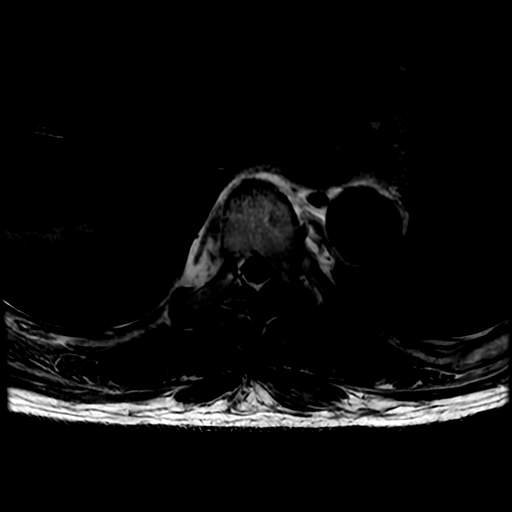
[im 9/33]
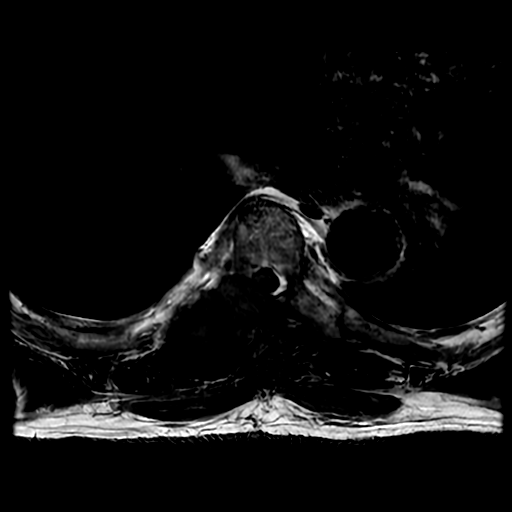
[im 17/33]
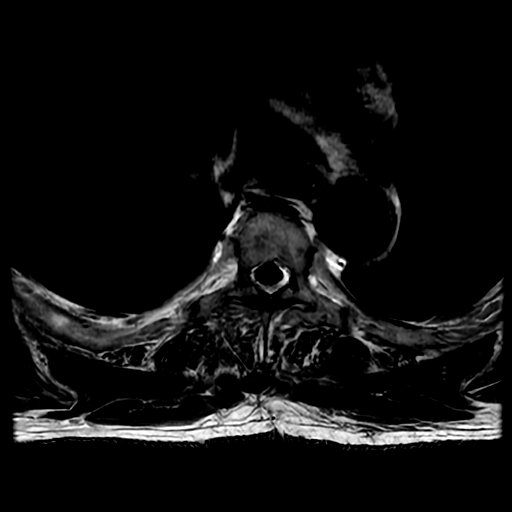
[im 25/33]
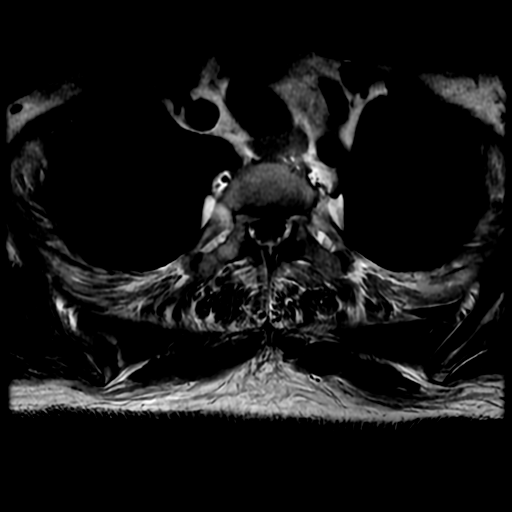
[im 33/33]
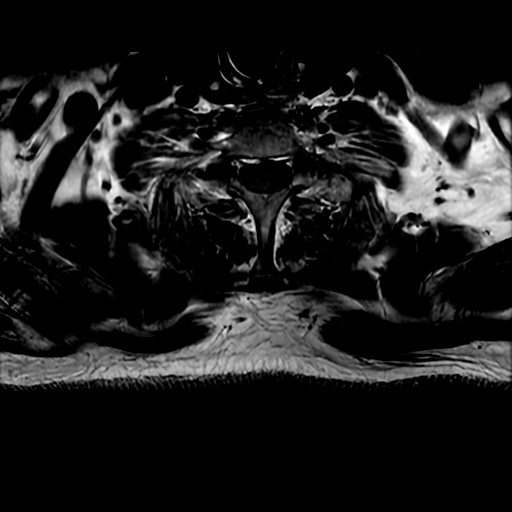

[18 of 48 positions shown; findings below may reference images not displayed]

FINDINGS: MRI THORACIC SPINE FINDINGS

Alignment:  Maintained.

Vertebrae: No fracture, evidence of discitis or worrisome lesion.
New focus of T1 and T2 hypointensity in the posterior margin of T6
to the right is presumably postoperative.

Cord: Evaluation is somewhat limited by motion but there is a edema
within the cord from approximately T5-6 to mid T7. Small focus of
dilatation of the central medullary canal at T4-5 is unchanged.

Paraspinal and other soft tissues: See below.

Disc levels:

T6-7: There is a heterogeneous collection without enhancement which
measures approximately 5 cm transverse by 3 cm AP by 4 cm
craniocaudal extending into the left laminectomy bed and along the
posterior arc of the right sixth and seventh ribs. The collection
causes severe cord compression. The hematoma also causes severe
right foraminal narrowing at T6-7. There appears to be residual
small disc protrusion eccentric to the left. The appearance of the
thoracic spine is otherwise unchanged.
IMPRESSION: Findings consistent with a large hematoma in the laminectomy bed and
posterior paraspinous soft tissues to the right resulting in severe
compression of the cord in conjunction with what appears to be a
residual left paracentral protrusion. There is edema within the cord
from approximately T5-6 to mid T7. No gross change in cord edema
since the prior examination but there is a possibility of cord
infarct due to compression.

Critical Value/emergent results were called by telephone at the time
of interpretation on [DATE] at [DATE] to provider MAWUSI., who verbally acknowledged these results.

## 2019-07-02 MED ORDER — GADOBUTROL 1 MMOL/ML IV SOLN
7.5000 mL | Freq: Once | INTRAVENOUS | Status: AC | PRN
  Administered 2019-07-02: 7.5 mL via INTRAVENOUS

## 2019-07-02 MED ORDER — DEXAMETHASONE SODIUM PHOSPHATE 4 MG/ML IJ SOLN
4.0000 mg | Freq: Four times a day (QID) | INTRAMUSCULAR | Status: DC
  Administered 2019-07-02 – 2019-07-07 (×19): 4 mg via INTRAVENOUS
  Filled 2019-07-02 (×20): qty 1

## 2019-07-02 MED ORDER — TAMSULOSIN HCL 0.4 MG PO CAPS
0.4000 mg | ORAL_CAPSULE | Freq: Every day | ORAL | Status: DC
  Administered 2019-07-02 – 2019-07-07 (×6): 0.4 mg via ORAL
  Filled 2019-07-02 (×7): qty 1

## 2019-07-02 NOTE — Progress Notes (Signed)
Patient off unit to MRI.

## 2019-07-02 NOTE — Anesthesia Postprocedure Evaluation (Signed)
Anesthesia Post Note  Patient: Timothy Davenport  Procedure(s) Performed: LUMBAR LAMINECTOMY/DECOMPRESSION MICRODISCECTOMY 1 LEVEL, THORACIC SIX-SEVEN (N/A )     Patient location during evaluation: PACU Anesthesia Type: General Level of consciousness: awake and alert Pain management: pain level controlled Vital Signs Assessment: post-procedure vital signs reviewed and stable Respiratory status: spontaneous breathing, nonlabored ventilation and respiratory function stable Cardiovascular status: blood pressure returned to baseline and stable Postop Assessment: no apparent nausea or vomiting Anesthetic complications: no    Last Vitals:  Vitals:   07/02/19 0320 07/02/19 0728  BP:  128/84  Pulse:  64  Resp:  (!) 21  Temp: 37.4 C 36.8 C  SpO2:  99%    Last Pain:  Vitals:   07/02/19 0728  TempSrc: Oral  PainSc:                  Lidia Collum

## 2019-07-02 NOTE — Progress Notes (Addendum)
  NEUROSURGERY PROGRESS NOTE   Hypotension overnight improved with 1L Per patient sometime after surgery he lost ability to use BLE. Nursing aware and documented but never made NSY aware.  No pain  EXAM:  BP 128/84 (BP Location: Right Arm)   Pulse 64   Temp 98.3 F (36.8 C) (Oral)   Resp (!) 21   Ht 5' 10.98" (1.803 m)   Wt 79.8 kg   SpO2 99%   BMI 24.56 kg/m   Awake, alert, oriented  Speech fluent, appropriate  CN grossly intact  5/5 BUE 0/5 BLE  IMPRESSION/PLAN 69 y.o. male POD 1 thoracic laminectomy and microdiscectomy. Worsening BLE function. - Repeat MRI    ==================================================================================================================== I have seen and examined Timothy Davenport and agree with the exam, impression, and plan as documented in the note by Ferne Reus, PA-C.  POD#1 s/p T6-7 laminectomy, transpedicular discectomy. Worsened motor exam this am with preserved sensation. Suspect (hopefully) transient worsening related to surgical manipulation. Will plan on MRI to r/o ongoing compression from hematoma or residual disc.  Consuella Lose, MD [facility]

## 2019-07-02 NOTE — Progress Notes (Addendum)
Patient retaining urine; >816mLs bladder scanned. On-call provider ordered for patient to be I/O catheterized. I/O cathed 800ccs clear yellow urine. Condom cath replaced and Flomax to be administered per new order.   0230: Patient voided minimal amount of 64mLs urine. Bladder scan revealed 118mLs. Will recheck in one hour.  0400: Bladder scanned 312mLs. I/O cath performed. 336mLs clear yellow urine emptied from bladder.

## 2019-07-02 NOTE — Evaluation (Signed)
Physical Therapy Evaluation Patient Details Name: Timothy Davenport MRN: [id number] DOB: [birth year] Today's Date: 07/02/2019   History of Present Illness  69 y.o. male with history of HTN, HDL, DM (last A1C <7%), ?recent TIA on plavix daily who presented to ED after a fall and inability to move BLE. 1 month ago pt developed significant RLE weakness, seeing PT without improvement in strength. Pt found to have large disc extrusion at T6-7 which results in severe spinal cord compression and associated T2 signal changes.  Clinical Impression  Pt presents to PT with deficits in functional mobility, gait, balance, strength, power, endurance, tone. Pt is flaccid in BLE without any movement noted. Pt currently requires significant assistance to perform all functional mobility, and sitting balance remains poor, requiring BUE support to prevent falls. Pt is able to utilize BUE to perform scoot transfer with PT assistance, however does need physical assistance to do so and to maintain back precautions. Pt does report some dizziness and is diaphoretic during mobility, BP stable, pt seems to recover once resting in recliner and with fan and wet cloth on forehead. Pt will continue to benefit from aggressive mobilization and PT POC to reduce falls risk and improve mobility quality.    Follow Up Recommendations CIR;Supervision/Assistance - 24 hour    Equipment Recommendations  Wheelchair (measurements PT);Wheelchair cushion (measurements PT);Other (comment)(slide board)    Recommendations for Other Services       Precautions / Restrictions Precautions Precautions: Fall;Back Precaution Comments: PT verbally reviews precautions with patient Required Braces or Orthoses: (no brace needed per orders) Restrictions Weight Bearing Restrictions: No      Mobility  Bed Mobility Overal bed mobility: Needs Assistance Bed Mobility: Rolling;Sidelying to Sit Rolling: Mod assist Sidelying to sit: Max assist        General bed mobility comments: LE management for rolling, LE management and trunk support for side to sit  Transfers Overall transfer level: Needs assistance Equipment used: None Transfers: Lateral/Scoot Transfers          Lateral/Scoot Transfers: Mod assist General transfer comment: pt performs lateral scoot to drop arm recliner. PT educating pt on head-hips relationship, PT managing LE and assisting with power for scoots. Transferring from slightly elevated bed  Ambulation/Gait                Stairs            Wheelchair Mobility    Modified Rankin (Stroke Patients Only)       Balance Overall balance assessment: Needs assistance Sitting-balance support: Bilateral upper extremity supported;Feet supported Sitting balance-Leahy Scale: Fair Sitting balance - Comments: minG at edge of bed with BUE support, pt able to maintain balance with unilateral UE support for very brief periods                                     Pertinent Vitals/Pain Pain Assessment: No/denies pain(at rest)    Home Living Family/patient expects to be discharged to:: Inpatient rehab Living Arrangements: Spouse/significant other Available Help at Discharge: Family;Available 24 hours/day(spouse minA available, recent hip surgery) Type of Home: House Home Access: Stairs to enter Entrance Stairs-Rails: None Entrance Stairs-Number of Steps: 1 Home Layout: One level Home Equipment: Walker - 2 wheels;Cane - single point;Wheelchair - manual;Shower seat      Prior Function Level of Independence: Independent         Comments: progressive weakness in RLE over last  2 months transitioning from cane to RW to wheelchair for mobility. Normally independent, gardens and fishes     Hand Dominance        Extremity/Trunk Assessment   Upper Extremity Assessment Upper Extremity Assessment: Overall WFL for tasks assessed    Lower Extremity Assessment Lower Extremity  Assessment: RLE deficits/detail;LLE deficits/detail RLE Deficits / Details: flaccid, 0/5, sensation WFL LLE Deficits / Details: flaccid, 0/5, sensation WFL    Cervical / Trunk Assessment Cervical / Trunk Assessment: Normal  Communication   Communication: No difficulties  Cognition Arousal/Alertness: Awake/alert Behavior During Therapy: WFL for tasks assessed/performed Overall Cognitive Status: Within Functional Limits for tasks assessed                                        General Comments General comments (skin integrity, edema, etc.): VSS on RA, BP prior to mobilizing-128/84 (98). BP sitting 120/92 (102), BP in recliner 117/86 (92). Pt does become diaphoretic and reports some lightheadedness with sitting, pt appears to be resting in NAD at end of session, sitting in recliner    Exercises     Assessment/Plan    PT Assessment Patient needs continued PT services  PT Problem List Decreased strength;Decreased activity tolerance;Decreased balance;Decreased mobility;Decreased knowledge of use of DME;Decreased safety awareness;Decreased knowledge of precautions;Impaired tone       PT Treatment Interventions DME instruction;Gait training;Stair training;Functional mobility training;Therapeutic activities;Therapeutic exercise;Balance training;Neuromuscular re-education;Cognitive remediation;Patient/family education;Wheelchair mobility training    PT Goals (Current goals can be found in the Care Plan section)  Acute Rehab PT Goals Patient Stated Goal: To improve mobility and reduce falls risk PT Goal Formulation: With patient Time For Goal Achievement: 07/16/19 Potential to Achieve Goals: Good    Frequency Min 5X/week   Barriers to discharge        Co-evaluation               AM-PAC PT "6 Clicks" Mobility  Outcome Measure Help needed turning from your back to your side while in a flat bed without using bedrails?: A Lot Help needed moving from lying on  your back to sitting on the side of a flat bed without using bedrails?: Total Help needed moving to and from a bed to a chair (including a wheelchair)?: A Lot Help needed standing up from a chair using your arms (e.g., wheelchair or bedside chair)?: Total Help needed to walk in hospital room?: Total Help needed climbing 3-5 steps with a railing? : Total 6 Click Score: 8    End of Session Equipment Utilized During Treatment: Gait belt Activity Tolerance: Patient tolerated treatment well Patient left: in chair;with call bell/phone within reach;with chair alarm set Nurse Communication: Mobility status PT Visit Diagnosis: Other abnormalities of gait and mobility (R26.89);Muscle weakness (generalized) (M62.81);History of falling (Z91.81);Other symptoms and signs involving the nervous system (R29.898)    Time: [id number] PT Time Calculation (min) (ACUTE ONLY): 29 min   Charges:   PT Evaluation $PT Eval Moderate Complexity: 1 Mod PT Treatments $Therapeutic Activity: 8-22 mins        Zenaida Niece, PT, DPT Acute Rehabilitation Pager: [phone]   Zenaida Niece 07/02/2019, 10:01 AM

## 2019-07-02 NOTE — Progress Notes (Addendum)
OT Cancellation Note  Patient Details Name: Timothy Davenport MRN: [id number] DOB: 1950/10/05   Cancelled Treatment:    Reason Eval/Treat Not Completed: Medical issues which prohibited therapy. Pt with worsening neuro exam per neurosurgery/scheduled for MRI. Will assess when medically appropriate.   [name], OT/L   Acute OT Clinical Specialist Acute Rehabilitation Services Pager [phone] Office [phone]  07/02/2019, 9:16 AM

## 2019-07-02 NOTE — Progress Notes (Signed)
I have personally reviewed the MRI T-spine done this afternoon. There has been removal of the large central disc with some hematoma in the operative bed but does not appear to be significantly compressive of the cord to me. There does appear to be worsened spinal cord edema with T2 signal change in the cord and I suspect this is the cause of Timothy Davenport worsened exam. I do not feel repeat operation is of any benefit at this point and will cont to monitor neurologic exam and add decadron 4mg  q6hrs.

## 2019-07-03 ENCOUNTER — Inpatient Hospital Stay (HOSPITAL_COMMUNITY): Payer: No Typology Code available for payment source | Admitting: Certified Registered Nurse Anesthetist

## 2019-07-03 ENCOUNTER — Encounter (HOSPITAL_COMMUNITY): Payer: Self-pay | Admitting: Neurosurgery

## 2019-07-03 ENCOUNTER — Encounter (HOSPITAL_COMMUNITY)
Admission: EM | Disposition: A | Discharge status: Rehabilitation Facility | Payer: Self-pay | Source: Home / Self Care | Attending: Neurosurgery

## 2019-07-03 DIAGNOSIS — M4714 Other spondylosis with myelopathy, thoracic region: Secondary | ICD-10-CM

## 2019-07-03 DIAGNOSIS — G8220 Paraplegia, unspecified: Secondary | ICD-10-CM

## 2019-07-03 HISTORY — PX: THORACIC DISCECTOMY: SHX6113

## 2019-07-03 LAB — GLUCOSE, CAPILLARY
Glucose-Capillary: 150 mg/dL — ABNORMAL HIGH (ref 70–99)
Glucose-Capillary: 169 mg/dL — ABNORMAL HIGH (ref 70–99)
Glucose-Capillary: 125 mg/dL — ABNORMAL HIGH (ref 70–99)
Glucose-Capillary: 155 mg/dL — ABNORMAL HIGH (ref 70–99)
Glucose-Capillary: 184 mg/dL — ABNORMAL HIGH (ref 70–99)

## 2019-07-03 SURGERY — THORACIC DISCECTOMY
Anesthesia: General

## 2019-07-03 MED ORDER — PROPOFOL 10 MG/ML IV BOLUS
INTRAVENOUS | Status: DC | PRN
  Administered 2019-07-03: 20 mg via INTRAVENOUS
  Administered 2019-07-03: 100 mg via INTRAVENOUS

## 2019-07-03 MED ORDER — 0.9 % SODIUM CHLORIDE (POUR BTL) OPTIME
TOPICAL | Status: DC | PRN
  Administered 2019-07-03: 1000 mL

## 2019-07-03 MED ORDER — ROCURONIUM BROMIDE 10 MG/ML (PF) SYRINGE
PREFILLED_SYRINGE | INTRAVENOUS | Status: DC | PRN
  Administered 2019-07-03: 60 mg via INTRAVENOUS

## 2019-07-03 MED ORDER — LIDOCAINE 2% (20 MG/ML) 5 ML SYRINGE
INTRAMUSCULAR | Status: AC
  Filled 2019-07-03: qty 5

## 2019-07-03 MED ORDER — LACTATED RINGERS IV SOLN: INTRAVENOUS | Status: DC

## 2019-07-03 MED ORDER — DEXAMETHASONE SODIUM PHOSPHATE 10 MG/ML IJ SOLN
INTRAMUSCULAR | Status: AC
  Filled 2019-07-03: qty 1

## 2019-07-03 MED ORDER — CEFAZOLIN SODIUM-DEXTROSE 2-3 GM-%(50ML) IV SOLR
INTRAVENOUS | Status: DC | PRN
  Administered 2019-07-03: 2 g via INTRAVENOUS

## 2019-07-03 MED ORDER — PHENYLEPHRINE 40 MCG/ML (10ML) SYRINGE FOR IV PUSH (FOR BLOOD PRESSURE SUPPORT)
PREFILLED_SYRINGE | INTRAVENOUS | Status: AC
  Filled 2019-07-03: qty 10

## 2019-07-03 MED ORDER — CHLORHEXIDINE GLUCONATE CLOTH 2 % EX PADS
6.0000 | MEDICATED_PAD | Freq: Every day | CUTANEOUS | Status: DC
  Administered 2019-07-04 – 2019-07-07 (×4): 6 via TOPICAL

## 2019-07-03 MED ORDER — SUGAMMADEX SODIUM 200 MG/2ML IV SOLN
INTRAVENOUS | Status: DC | PRN
  Administered 2019-07-03: 200 mg via INTRAVENOUS

## 2019-07-03 MED ORDER — ROCURONIUM BROMIDE 10 MG/ML (PF) SYRINGE
PREFILLED_SYRINGE | INTRAVENOUS | Status: AC
  Filled 2019-07-03: qty 10

## 2019-07-03 MED ORDER — ONDANSETRON HCL 4 MG/2ML IJ SOLN
INTRAMUSCULAR | Status: AC
  Filled 2019-07-03: qty 2

## 2019-07-03 MED ORDER — ONDANSETRON HCL 4 MG/2ML IJ SOLN
INTRAMUSCULAR | Status: DC | PRN
  Administered 2019-07-03: 4 mg via INTRAVENOUS

## 2019-07-03 MED ORDER — BUPIVACAINE HCL (PF) 0.5 % IJ SOLN
INTRAMUSCULAR | Status: AC
  Filled 2019-07-03: qty 30

## 2019-07-03 MED ORDER — FENTANYL CITRATE (PF) 250 MCG/5ML IJ SOLN
INTRAMUSCULAR | Status: DC | PRN
  Administered 2019-07-03: 100 ug via INTRAVENOUS

## 2019-07-03 MED ORDER — PHENYLEPHRINE 40 MCG/ML (10ML) SYRINGE FOR IV PUSH (FOR BLOOD PRESSURE SUPPORT)
PREFILLED_SYRINGE | INTRAVENOUS | Status: DC | PRN
  Administered 2019-07-03: 120 ug via INTRAVENOUS

## 2019-07-03 MED ORDER — LIDOCAINE 2% (20 MG/ML) 5 ML SYRINGE
INTRAMUSCULAR | Status: DC | PRN
  Administered 2019-07-03: 40 mg via INTRAVENOUS

## 2019-07-03 MED ORDER — BACITRACIN ZINC 500 UNIT/GM EX OINT
TOPICAL_OINTMENT | CUTANEOUS | Status: AC
  Filled 2019-07-03: qty 28.35

## 2019-07-03 MED ORDER — LIDOCAINE-EPINEPHRINE 1 %-1:100000 IJ SOLN
INTRAMUSCULAR | Status: AC
  Filled 2019-07-03: qty 1

## 2019-07-03 MED ORDER — THROMBIN 5000 UNITS EX SOLR
OROMUCOSAL | Status: DC | PRN
  Administered 2019-07-03: 5 mL via TOPICAL

## 2019-07-03 MED ORDER — THROMBIN 5000 UNITS EX SOLR
CUTANEOUS | Status: AC
  Filled 2019-07-03: qty 5000

## 2019-07-03 MED ORDER — DEXAMETHASONE SODIUM PHOSPHATE 10 MG/ML IJ SOLN
INTRAMUSCULAR | Status: DC | PRN
  Administered 2019-07-03: 10 mg via INTRAVENOUS

## 2019-07-03 MED ORDER — FENTANYL CITRATE (PF) 250 MCG/5ML IJ SOLN
INTRAMUSCULAR | Status: AC
  Filled 2019-07-03: qty 5

## 2019-07-03 MED ORDER — SODIUM CHLORIDE 0.9 % IV SOLN
INTRAVENOUS | Status: DC | PRN
  Administered 2019-07-03: 500 mL

## 2019-07-03 MED ORDER — PHENYLEPHRINE HCL (PRESSORS) 10 MG/ML IV SOLN: INTRAVENOUS | Status: DC | PRN

## 2019-07-03 SURGICAL SUPPLY — 57 items
BAG DECANTER FOR FLEXI CONT (MISCELLANEOUS) ×3 IMPLANT
BAND RUBBER #18 3X1/16 STRL (MISCELLANEOUS) IMPLANT
BENZOIN TINCTURE PRP APPL 2/3 (GAUZE/BANDAGES/DRESSINGS) IMPLANT
BLADE CLIPPER SURG (BLADE) IMPLANT
BLADE SURG 11 STRL SS (BLADE) ×3 IMPLANT
BUR MATCHSTICK NEURO 3.0 LAGG (BURR) ×3 IMPLANT
CANISTER SUCT 3000ML PPV (MISCELLANEOUS) ×3 IMPLANT
CARTRIDGE OIL MAESTRO DRILL (MISCELLANEOUS) ×1 IMPLANT
CLOSURE WOUND 1/2 X4 (GAUZE/BANDAGES/DRESSINGS)
COVER WAND RF STERILE (DRAPES) ×3 IMPLANT
DECANTER SPIKE VIAL GLASS SM (MISCELLANEOUS) ×3 IMPLANT
DIFFUSER DRILL AIR PNEUMATIC (MISCELLANEOUS) ×3 IMPLANT
DRAPE LAPAROTOMY 100X72X124 (DRAPES) ×3 IMPLANT
DRAPE MICROSCOPE LEICA (MISCELLANEOUS) IMPLANT
DRAPE SURG 17X23 STRL (DRAPES) ×3 IMPLANT
DRSG OPSITE 4X5.5 SM (GAUZE/BANDAGES/DRESSINGS) ×3 IMPLANT
DRSG OPSITE POSTOP 4X6 (GAUZE/BANDAGES/DRESSINGS) ×3 IMPLANT
DURAPREP 26ML APPLICATOR (WOUND CARE) ×3 IMPLANT
ELECT REM PT RETURN 9FT ADLT (ELECTROSURGICAL) ×3
ELECTRODE REM PT RTRN 9FT ADLT (ELECTROSURGICAL) ×1 IMPLANT
EVACUATOR 1/8 PVC DRAIN (DRAIN) ×3 IMPLANT
GAUZE 4X4 16PLY RFD (DISPOSABLE) IMPLANT
GAUZE SPONGE 4X4 12PLY STRL (GAUZE/BANDAGES/DRESSINGS) IMPLANT
GLOVE BIO SURGEON STRL SZ7.5 (GLOVE) IMPLANT
GLOVE BIOGEL PI IND STRL 7.5 (GLOVE) ×2 IMPLANT
GLOVE BIOGEL PI INDICATOR 7.5 (GLOVE) ×4
GLOVE ECLIPSE 7.0 STRL STRAW (GLOVE) ×3 IMPLANT
GLOVE EXAM NITRILE XL STR (GLOVE) IMPLANT
GOWN STRL REUS W/ TWL LRG LVL3 (GOWN DISPOSABLE) ×2 IMPLANT
GOWN STRL REUS W/ TWL XL LVL3 (GOWN DISPOSABLE) IMPLANT
GOWN STRL REUS W/TWL 2XL LVL3 (GOWN DISPOSABLE) IMPLANT
GOWN STRL REUS W/TWL LRG LVL3 (GOWN DISPOSABLE) ×6
GOWN STRL REUS W/TWL XL LVL3 (GOWN DISPOSABLE)
HEMOSTAT POWDER KIT SURGIFOAM (HEMOSTASIS) ×3 IMPLANT
HEMOSTAT POWDER SURGIFOAM 1G (HEMOSTASIS) ×3 IMPLANT
KIT BASIN OR (CUSTOM PROCEDURE TRAY) ×3 IMPLANT
KIT TURNOVER KIT B (KITS) ×3 IMPLANT
NEEDLE HYPO 18GX1.5 BLUNT FILL (NEEDLE) IMPLANT
NEEDLE HYPO 25X1 1.5 SAFETY (NEEDLE) ×3 IMPLANT
NEEDLE SPNL 18GX3.5 QUINCKE PK (NEEDLE) ×3 IMPLANT
NS IRRIG 1000ML POUR BTL (IV SOLUTION) ×3 IMPLANT
OIL CARTRIDGE MAESTRO DRILL (MISCELLANEOUS) ×3
PACK LAMINECTOMY NEURO (CUSTOM PROCEDURE TRAY) ×3 IMPLANT
PAD ARMBOARD 7.5X6 YLW CONV (MISCELLANEOUS) ×9 IMPLANT
SPONGE LAP 4X18 RFD (DISPOSABLE) IMPLANT
SPONGE SURGIFOAM ABS GEL 100 (HEMOSTASIS) IMPLANT
STRIP CLOSURE SKIN 1/2X4 (GAUZE/BANDAGES/DRESSINGS) IMPLANT
SUT VIC AB 0 CT1 18XCR BRD8 (SUTURE) ×2 IMPLANT
SUT VIC AB 0 CT1 8-18 (SUTURE) ×6
SUT VIC AB 2-0 CT1 18 (SUTURE) ×3 IMPLANT
SUT VIC AB 3-0 FS2 27 (SUTURE) IMPLANT
SUT VIC AB 3-0 SH 8-18 (SUTURE) ×3 IMPLANT
SUT VICRYL 3-0 RB1 18 ABS (SUTURE) ×3 IMPLANT
SYR 3ML LL SCALE MARK (SYRINGE) IMPLANT
TOWEL GREEN STERILE (TOWEL DISPOSABLE) ×3 IMPLANT
TOWEL GREEN STERILE FF (TOWEL DISPOSABLE) ×3 IMPLANT
WATER STERILE IRR 1000ML POUR (IV SOLUTION) ×3 IMPLANT

## 2019-07-03 NOTE — Progress Notes (Signed)
Blood sugar check - 169 and insulin coverage given, CHG bath completed. Pt off unit to pre-op area with RN and transport personnel. Report given to Ginger, Therapist, sports.

## 2019-07-03 NOTE — Progress Notes (Addendum)
Patient arrived back on unit.Hemovac drain intact. Pt alert and oriented and reports pain 6/10, Vicodin PRN given. VS stable with slighty hypertensive (BP=157/92) so morning BP meds given.  Bladder scan 621ml, therfore foley inserted per verbal order for urinary retention r/t spinal trauma  Upon assessment, no change in patients ability to feel sensation but remains without ability to move BLE

## 2019-07-03 NOTE — Progress Notes (Addendum)
OT Cancellation Note  Patient Details Name: ORR VISCOMI MRN: [id number] DOB: 02-Dec-1950   Cancelled Treatment:    Reason Eval/Treat Not Completed: Patient at procedure or test/ unavailable(going to [facility] - operative evacuation of hematoma)  Merri Ray Hilliard 07/03/2019, 9:50 AM   Jesse Sans OTR/L Acute Rehabilitation Services Pager: [phone] Office: [phone]

## 2019-07-03 NOTE — Anesthesia Postprocedure Evaluation (Signed)
Anesthesia Post Note  Patient: Timothy Davenport  Procedure(s) Performed: Evacuation of Thoracic Hematoma (N/A )     Patient location during evaluation: PACU Anesthesia Type: General Level of consciousness: awake and alert, oriented and patient cooperative Pain management: pain level controlled Vital Signs Assessment: post-procedure vital signs reviewed and stable Respiratory status: spontaneous breathing, nonlabored ventilation, respiratory function stable and patient connected to nasal cannula oxygen Cardiovascular status: blood pressure returned to baseline and stable Postop Assessment: no apparent nausea or vomiting Anesthetic complications: no    Last Vitals:  Vitals:   07/03/19 1201 07/03/19 1214  BP: (!) 148/84 (!) 157/92  Pulse:  76  Resp:  17  Temp:  36.6 C  SpO2:  97%    Last Pain:  Vitals:   07/03/19 1214  TempSrc: Oral  PainSc:                  Martena Emanuele,E. Uno Esau

## 2019-07-03 NOTE — Anesthesia Preprocedure Evaluation (Addendum)
Anesthesia Evaluation  Patient identified by MRN, date of birth, ID band Patient awake    Reviewed: Allergy & Precautions, NPO status , Patient's Chart, lab work & pertinent test results  History of Anesthesia Complications Negative for: history of anesthetic complications  Airway Mallampati: I  TM Distance: >3 FB Neck ROM: Full    Dental  (+) Edentulous Upper, Edentulous Lower   Pulmonary neg pulmonary ROS,  06/30/2019 SARS coronavirus NEG   breath sounds clear to auscultation       Cardiovascular hypertension, Pt. on medications (-) angina Rhythm:Regular Rate:Normal     Neuro/Psych Thoracic myelopathy    GI/Hepatic Neg liver ROS, GERD  Medicated and Controlled,  Endo/Other  diabetes (glu 169), Oral Hypoglycemic Agents  Renal/GU negative Renal ROS     Musculoskeletal   Abdominal   Peds  Hematology plavix   Anesthesia Other Findings   Reproductive/Obstetrics                           Anesthesia Physical Anesthesia Plan  ASA: III  Anesthesia Plan: General   Post-op Pain Management:    Induction: Intravenous  PONV Risk Score and Plan: 3 and Ondansetron, Dexamethasone and Treatment may vary due to age or medical condition  Airway Management Planned: Oral ETT  Additional Equipment:   Intra-op Plan:   Post-operative Plan: Extubation in OR  Informed Consent: I have reviewed the patients History and Physical, chart, labs and discussed the procedure including the risks, benefits and alternatives for the proposed anesthesia with the patient or authorized representative who has indicated his/her understanding and acceptance.     Dental advisory given  Plan Discussed with: CRNA and Surgeon  Anesthesia Plan Comments:        Anesthesia Quick Evaluation

## 2019-07-03 NOTE — Progress Notes (Signed)
Received information from floor nurse that bladder scan revealed 347 cc.  RN stated that amount not within order parameters to perform in and out cath.  Dr. Glennon Mac made aware.

## 2019-07-03 NOTE — Progress Notes (Signed)
PT Cancellation Note  Patient Details Name: Timothy Davenport MRN: [id number] DOB: 1950/08/13   Cancelled Treatment:    Reason Eval/Treat Not Completed: Patient at procedure or test/unavailable. Pt off floor for hematoma evacuation. PT will follow up once medically stable and appropriate for PT evaluation.   Zenaida Niece 07/03/2019, 11:20 AM

## 2019-07-03 NOTE — Transfer of Care (Signed)
Immediate Anesthesia Transfer of Care Note  Patient: OSBURN BABIAK  Procedure(s) Performed: Evacuation of Thoracic Hematoma (N/A )  Patient Location: PACU  Anesthesia Type:General  Level of Consciousness: awake, alert  and drowsy  Airway & Oxygen Therapy: Patient Spontanous Breathing  Post-op Assessment: Report given to RN, Post -op Vital signs reviewed and stable and Patient moving all extremities X 4  Post vital signs: Reviewed and stable  Last Vitals:  Vitals Value Taken Time  BP    Temp    Pulse 79 07/03/19 1148  Resp 8 07/03/19 1148  SpO2 99 % 07/03/19 1148  Vitals shown include unvalidated device data.  Last Pain:  Vitals:   07/03/19 1022  TempSrc:   PainSc: 0-No pain      Patients Stated Pain Goal: 2 ([id number])  Complications: No apparent anesthesia complications

## 2019-07-03 NOTE — Consult Note (Signed)
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  Referring Physician:  Dr. Kathyrn Sheriff   HPI: Timothy Davenport is a 69 y.o. male with history of T2DM with neuropathy who started developing RLE weakness a month ago and recent TIA who was admitted on 06/30/19 after a fall with inability to move LLE and worsening of RLE. MRI brain negative for acute changes and incident right parotid glands neoplasms. 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 He was evaluated by Dr. Kathyrn Sheriff and taken to OR 5/12 for T6 & T7 laminectomy and right T6 & T7 pediculotomy for microdiskectomy at T6/T7.  Post op with hypotension and dizziness with presyncope as well as urinary retention. He was started on IV decadron as follow MRI showed large hematoma in laminectomy bed and paraspinal soft tissues resulting in severe cord compression in conjunction to residual small disc protursion and cord edema T5-mid T7. Due to neurological decline, he was taken back to OR on 5/14 for    Review of Systems  Constitutional: Negative for fever.  HENT: Negative for hearing loss.   Eyes: Negative for double vision.  Gastrointestinal: Negative for nausea.  Genitourinary: Negative for dysuria.  Musculoskeletal: Positive for back pain.  Skin: Negative for rash.  Neurological: Positive for sensory change, focal weakness and weakness.  Psychiatric/Behavioral: Negative for depression.      Past Medical History:  Diagnosis Date  . Diabetes mellitus without complication ([place])   . Diabetic neuropathy [facility])     Past Surgical History:  Procedure Laterality Date  . LUMBAR LAMINECTOMY/DECOMPRESSION MICRODISCECTOMY N/A 07/01/2019   Procedure: LUMBAR LAMINECTOMY/DECOMPRESSION MICRODISCECTOMY 1 LEVEL, THORACIC SIX-SEVEN;  Surgeon: Consuella Lose, MD;  Location: [place];  Service: Neurosurgery;  Laterality: N/A;  LUMBAR LAMINECTOMY/DECOMPRESSION MICRODISCECTOMY 1 LEVEL, THORACIC SIX-SEVEN     Family History  Problem Relation Age of Onset  . Hypertension Mother   . Hypertension Father     Social History:  has no history on file for tobacco, alcohol, and drug.    Allergies: No Known Allergies    Medications Prior to Admission  Medication Sig Dispense Refill  . amLODipine (NORVASC) 10 MG tablet Take 10 mg by mouth daily.    [name] atorvastatin (LIPITOR) 80 MG tablet Take 80 mg by mouth at bedtime.    . clopidogrel (PLAVIX) 75 MG tablet Take 75 mg by mouth daily. "REPLACES ASPIRIN FOR CLOT PREVENTION    . glipiZIDE (GLUCOTROL) 5 MG tablet Take 5 mg by mouth daily before breakfast.     . hydrALAZINE (APRESOLINE) 25 MG tablet Take 25 mg by mouth 2 (two) times daily.    [name] losartan (COZAAR) 100 MG tablet Take 100 mg by mouth daily.    [name] omeprazole (PRILOSEC) 20 MG capsule Take 20 mg by mouth daily before breakfast.    . pioglitazone (ACTOS) 30 MG tablet Take 30 mg by mouth daily.    . sildenafil (VIAGRA) 100 MG tablet Take 50 mg by mouth daily as needed (as directed- NOT TO EXCEED 50 MG/24 HOURS).       Home: Home Living Family/patient expects to be discharged to:: Inpatient rehab Living Arrangements: Spouse/significant other Available Help at Discharge: Family, Available 24 hours/day(spouse minA available, recent hip surgery) Type of Home: House Home Access: Stairs to enter [employer] of Steps: 1 Entrance Stairs-Rails: None Home Layout:  One level Home Equipment: Environmental consultant - 2 wheels, [employer] - single point, Wheelchair - manual, Careers adviser History: Prior Function Level of Independence: Independent Comments: progressive weakness in RLE over last 2 months transitioning from cane to RW to wheelchair for mobility. Normally independent, gardens and fishes Functional Status:  Mobility: Bed Mobility Overal  bed mobility: Needs Assistance Bed Mobility: Rolling, Sidelying to Sit Rolling: Mod assist Sidelying to sit: Max assist General bed mobility comments: LE management for rolling, LE management and trunk support for side to sit Transfers Overall transfer level: Needs assistance Equipment used: None Transfers: Lateral/Scoot Transfers  Lateral/Scoot Transfers: Mod assist General transfer comment: pt performs lateral scoot to drop arm recliner. PT educating pt on head-hips relationship, PT managing LE and assisting with power for scoots. Transferring from slightly elevated bed      ADL:    Cognition: Cognition Overall Cognitive Status: Within Functional Limits for tasks assessed Orientation Level: Oriented X4 Cognition Arousal/Alertness: Awake/alert Behavior During Therapy: WFL for tasks assessed/performed Overall Cognitive Status: Within Functional Limits for tasks assessed   Blood pressure (!) 151/83, pulse 76, temperature 98.4 F (36.9 C), temperature source Oral, resp. rate 18, height 5' 10.98" (1.803 m), weight 79.8 kg, SpO2 98 %. Physical Exam  Constitutional: He appears well-developed and well-nourished. No distress.  HENT:  Head: Normocephalic.  Cardiovascular: Normal rate.  Respiratory: Effort normal.  GI: Soft.  Musculoskeletal:     Cervical back: Normal range of motion.  Neurological: He is alert. No cranial nerve deficit.  UE 5/5 prox to distal. LE: tr-1/5 HE bilaterally. T12-L1 sensory level. DTR's 2+ LE's, sustained clonus right ankle. No resting tone. Sensed gross touch below waist. Decreased LT/pain  Skin: Skin is warm.  Back incision dressed  Psychiatric: He has a normal mood and affect. His behavior is normal.    Results for orders placed or performed during the hospital encounter of 06/30/19 (from the past 24 hour(s))  Glucose, capillary     Status: Abnormal   Collection Time: 07/02/19 11:23 AM  Result Value Ref Range   Glucose-Capillary 189 (H) 70 - 99  mg/dL  Glucose, capillary     Status: Abnormal   Collection Time: 07/02/19  5:38 PM  Result Value Ref Range   Glucose-Capillary 202 (H) 70 - 99 mg/dL  Glucose, capillary     Status: Abnormal   Collection Time: 07/02/19  9:53 PM  Result Value Ref Range   Glucose-Capillary 116 (H) 70 - 99 mg/dL  Glucose, capillary     Status: Abnormal   Collection Time: 07/03/19  7:42 AM  Result Value Ref Range   Glucose-Capillary 150 (H) 70 - 99 mg/dL   DG Thoracolumabar Spine  Result Date: 07/01/2019 CLINICAL DATA:  T6-T7 laminectomy EXAM: THORACOLUMBAR SPINE 1V COMPARISON:  None. FINDINGS: Two fluoroscopic images are obtained during performance of the procedure and are provided for interpretation only. Images demonstrate surgical instrumentation overlying the thoracic spine. Exact level cannot be ascertained given limited field of view. Please refer to operative report. FLUOROSCOPY TIME:  13.6 seconds IMPRESSION: 1. Intraoperative exam as above, please refer to operative report. Electronically Signed   By: Randa Ngo M.D.   On: 07/01/2019 19:23   MR THORACIC SPINE W WO CONTRAST  Result Date: 07/02/2019 CLINICAL DATA:  Patient status post T6-7 laminectomy and decompression 07/01/2019. Worsening motor function this morning with preserved sensation. EXAM: MRI THORACIC WITHOUT AND WITH CONTRAST TECHNIQUE: Multiplanar and multiecho pulse sequences of the thoracic spine were obtained without and  with intravenous contrast. CONTRAST:  7.5 mL GADAVIST IV SOLN COMPARISON:  MRI thoracic spine 06/20/2019. FINDINGS: MRI THORACIC SPINE FINDINGS Alignment:  Maintained. Vertebrae: No fracture, evidence of discitis or worrisome lesion. New focus of T1 and T2 hypointensity in the posterior margin of T6 to the right is presumably postoperative. Cord: Evaluation is somewhat limited by motion but there is a edema within the cord from approximately T5-6 to mid T7. Small focus of dilatation of the central medullary canal at T4-5  is unchanged. Paraspinal and other soft tissues: See below. Disc levels: T6-7: There is a heterogeneous collection without enhancement which measures approximately 5 cm transverse by 3 cm AP by 4 cm craniocaudal extending into the left laminectomy bed and along the posterior arc of the right sixth and seventh ribs. The collection causes severe cord compression. The hematoma also causes severe right foraminal narrowing at T6-7. There appears to be residual small disc protrusion eccentric to the left. The appearance of the thoracic spine is otherwise unchanged. IMPRESSION: Findings consistent with a large hematoma in the laminectomy bed and posterior paraspinous soft tissues to the right resulting in severe compression of the cord in conjunction with what appears to be a residual left paracentral protrusion. There is edema within the cord from approximately T5-6 to mid T7. No gross change in cord edema since the prior examination but there is a possibility of cord infarct due to compression. Critical Value/emergent results were called by telephone at the time of interpretation on 07/02/2019 at 1:56 pm to provider [facility] who verbally acknowledged these results. Electronically Signed   By: Inge Rise M.D.   On: 07/02/2019 13:59   [place] Intraoperative  Result Date: 07/01/2019 CLINICAL DATA:  Ultrasound was provided for use by the ordering physician, and a technical charge was applied by the performing facility.  No radiologist interpretation/professional services rendered.   DG C-Arm 1-60 Min  Result Date: 07/01/2019 CLINICAL DATA:  T6-T7 laminectomy EXAM: THORACOLUMBAR SPINE 1V COMPARISON:  None. FINDINGS: Two fluoroscopic images are obtained during performance of the procedure and are provided for interpretation only. Images demonstrate surgical instrumentation overlying the thoracic spine. Exact level cannot be ascertained given limited field of view. Please refer to operative report.  FLUOROSCOPY TIME:  13.6 seconds IMPRESSION: 1. Intraoperative exam as above, please refer to operative report. Electronically Signed   By: Randa Ngo M.D.   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 Pt with incomplete paraplegia and presents more like a T12/L1 injury from both a sensory and motor standpoint 1. Does the need for close, 24 hr/day medical supervision in concert with the patient's rehab needs make it unreasonable for this patient to be served in a less intensive setting? Yes 2. Co-Morbidities requiring supervision/potential complications: neurogenic bowel and bladder, pain, post-op hematoma 3. Due to bladder management, bowel management, safety, skin/wound care, disease management, medication administration, pain management and patient education, does the patient require 24 hr/day rehab nursing? Yes 4. Does the patient require coordinated care of a physician, rehab nurse, therapy disciplines of PT, OT to address physical and functional deficits in the context of the above medical diagnosis(es)? Yes Addressing deficits in the following areas: balance, endurance, locomotion, strength, transferring, bowel/bladder control, bathing, dressing, feeding, grooming, toileting and psychosocial support 5. Can the patient actively participate in an intensive therapy program of at least 3 hrs of therapy per day at least 5 days  per week? Yes 6. The potential for patient to make measurable gains while on inpatient rehab is excellent 7. Anticipated functional outcomes upon discharge from inpatient rehab are supervision and min assist  with PT, supervision and min assist with OT, n/a with SLP. 8. Estimated rehab length of stay to reach the above functional goals is: 20-25 days 9. Anticipated discharge destination: Home 10. Overall Rehab/Functional Prognosis:  excellent  RECOMMENDATIONS: This patient's condition is appropriate for continued rehabilitative care in the following setting: CIR Patient has agreed to participate in recommended program. Yes Note that insurance prior authorization may be required for reimbursement for recommended care.  Recommend bilateral PRAFO's which I have ordered to stretch bilateral heel cords which are already tight.  Also would rec screening venous doppler studies LE  Comment: Rehab Admissions Coordinator to follow up.  Thanks,  Meredith Staggers, MD, Mellody Drown  I have personally performed a face to face diagnostic evaluation of this patient. Additionally, I have examined pertinent labs and radiographic images. I have reviewed and concur with the physician assistant's documentation above.    Bary Leriche, PA-C 07/03/2019

## 2019-07-03 NOTE — Anesthesia Procedure Notes (Signed)
Procedure Name: Intubation Date/Time: 07/03/2019 10:53 AM Performed by: Harden Mo, CRNA Pre-anesthesia Checklist: Patient identified, Emergency Drugs available, Suction available and Patient being monitored Patient Re-evaluated:Patient Re-evaluated prior to induction Oxygen Delivery Method: Circle system utilized Preoxygenation: Pre-oxygenation with 100% oxygen Induction Type: IV induction Ventilation: Mask ventilation without difficulty and Oral airway inserted - appropriate to patient size Laryngoscope Size: Sabra Heck and 2 Grade View: Grade I Tube type: Oral Tube size: 7.5 mm Number of attempts: 1 Airway Equipment and Method: Stylet Placement Confirmation: ETT inserted through vocal cords under direct vision,  positive ETCO2 and breath sounds checked- equal and bilateral Secured at: 23 cm Tube secured with: Tape Dental Injury: Teeth and Oropharynx as per pre-operative assessment  Comments: Blood noted around vocal cords prior to intubation

## 2019-07-03 NOTE — Op Note (Signed)
  NEUROSURGERY OPERATIVE NOTE   PREOP DIAGNOSIS:  1. Thoracic hematoma   POSTOP DIAGNOSIS: Same  PROCEDURE: 1. Evacuation of postoperative hematoma  SURGEON: Dr. Consuella Lose, MD  ASSISTANT: Ferne Reus, PA-C  ANESTHESIA: General Endotracheal  EBL: Minimal  SPECIMENS: None  DRAINS: Subfacial Hemovac  COMPLICATIONS: None immediate  CONDITION: Hemodynamically stable to PACU  HISTORY: AVORY BARRETTA is a 69 y.o. male initially admitted to the hospital with a large thoracic disc herniation and dense paraparesis.  He initially underwent thoracic discectomy.  Postoperatively, he was fully, he was found to have worsened lower extremity weakness with MRI demonstrating both cord edema as well as hematoma in the operative bed.  After discussion with the patient and his family we elected to proceed with evacuation of hematoma.  Risks, benefits, and alternatives were reviewed in detail with the patient and his son.  After all questions were answered informed consent was obtained and witnessed.  PROCEDURE IN DETAIL: The patient was brought to the operating room. After induction of general anesthesia, the patient was positioned on the operative table in the prone position. All pressure points were meticulously padded. Skin incision was then marked out and prepped and draped in the usual sterile fashion.  After timeout was conducted, the previous incision was opened with scissors.  Subcutaneous stitches were cut.  The fascial stitches were then identified and cut.  Self-retaining retractors were placed.  There was hematoma within the muscle tracking down along the lamina and around the thecal sac.  It was unclear whether the hematoma was truly compressive of the thecal sac however there was a fair bit of hematoma in the operative bed.  This was removed using combination of suction and blunt dissection.  The thecal sac was identified as was the T7 rib.  The ligated T6 nerve root was also  identified.  At this point, the self-retaining retractors were removed, and the operative bed was inspected for any active bleeding.  Small areas of muscular bleeding were easily controlled with bipolar electrocautery.  The wound was irrigated with copious amounts of normal saline irrigation.  No further bleeding was identified.  A Hemovac drain was placed and tunneled subcutaneously.  The wound was then closed in multiple layers using a combination of interrupted 0 Vicryl stitches.  Skin was closed with staples.  Bacitracin ointment and sterile dressing was applied.  The drain was secured with 0 Vicryl stitch.  At the end of the case all sponge, needle, instrument, and cottonoid counts were correct.  The patient was then transferred to the stretcher, extubated, and taken to the postanesthesia care unit in stable hemodynamic condition.

## 2019-07-03 NOTE — Progress Notes (Signed)
  NEUROSURGERY PROGRESS NOTE   Pt had urinary retention last night, require I/O cath.   EXAM:  BP (!) 151/83 (BP Location: Left Arm)   Pulse 76   Temp 98.4 F (36.9 C) (Oral)   Resp 18   Ht 5' 10.98" (1.803 m)   Wt 79.8 kg   SpO2 98%   BMI 24.56 kg/m   Awake, alert, oriented  Speech fluent, appropriate  CN grossly intact  5/5 BUE 0/5 BLE, preserved sensation   IMPRESSION:  69 y.o. male s/p thoracic discectomy with worsened exam. I have reviewed the MRI and while I suspect his exam is more related to cord edema, upon further consideration I think evacuation of the hematoma may allow added space for the cord to swell and relieve any potential compressive effect.  PLAN: - Will proceed with operative evacuation of hematoma - Cont dex  I have reviewed the MRI findings with the patient and his son at bedside. We reviewed treatment options. We discussed details of the hematoma evacuation and rationale for proceeding. All their questions were answered and both the patient and son agree to proceed.

## 2019-07-03 NOTE — Progress Notes (Signed)
Inpatient Rehabilitation Admissions Coordinator  Noted in surgery. I will follow up on Monday.  Danne Baxter, RN, MSN Rehab Admissions Coordinator [phone] 07/03/2019 1:32 PM

## 2019-07-04 LAB — GLUCOSE, CAPILLARY
Glucose-Capillary: 172 mg/dL — ABNORMAL HIGH (ref 70–99)
Glucose-Capillary: 215 mg/dL — ABNORMAL HIGH (ref 70–99)
Glucose-Capillary: 178 mg/dL — ABNORMAL HIGH (ref 70–99)
Glucose-Capillary: 189 mg/dL — ABNORMAL HIGH (ref 70–99)

## 2019-07-04 NOTE — Progress Notes (Signed)
Orthopedic Tech Progress Note Patient Details:  Timothy Davenport Sep 11, 1950 [id number]  Ortho Devices Type of Ortho Device: ASO Ortho Device/Splint Location: LLE Ortho Device/Splint Interventions: Application   Post Interventions Patient Tolerated: Well Instructions Provided: Care of device   Kyra A Tye 07/04/2019, 3:59 PM

## 2019-07-04 NOTE — Progress Notes (Signed)
Patient ID: Timothy Davenport, male   DOB: 1950-11-23, 69 y.o.   MRN: [id number] States he feels much better this morning.  Slept well.  Not much pain.  He has 0 out of 5 strength in the lower extremities but has some sensation.  Drain is intact.  Out of bed today.  Physical and occupational therapy.  CIR consult.  Continue Decadron.

## 2019-07-04 NOTE — Progress Notes (Signed)
Physical Therapy Treatment Patient Details Name: Timothy Davenport MRN: [id number] DOB: 03/27/1950 Today's Date: 07/04/2019    History of Present Illness Pt is a 69 y.o. male with history of HTN, HDL, DM (last A1C <7%), ?recent TIA on plavix daily who presented to ED after a fall and inability to move BLE. 1 month ago pt developed significant RLE weakness, seeing PT without improvement in strength. Pt found to have large disc extrusion at T6-7 which results in severe spinal cord compression and associated T2 signal changes. Pt is now s/p T6, T7 laminectomy, right T6 and T7 pediculotomy for microdiscectomy at T6-7 on 5/12. Pt with worsening edema around the spinal cord and development of a hematoma post-operatively which was evacuated on 5/14.    PT Comments    Pt requesting focus of session on education re: HEP for bilateral LEs that he can perform while in the bed on his own. PT provided pt with gait belt and reviewed various LE exercises pt can perform with bilateral UEs assisting with use of gait belt. PT encouraged pt to be OOB later today (he was finishing up lunch and wanted to remain in bed even after offering to assist with sitting EOB or in chair). PT also spoke with RN and NT about assisting pt OOB to recliner chair later today. All in agreement. Continue to feel that pt would greatly benefit from further intensive therapy services at CIR to maximize his independence with functional mobility prior to returning home with family support. PT will continue to f/u with pt acutely to progress mobility as tolerated.   All VSS throughout.     Follow Up Recommendations  CIR;Supervision/Assistance - 24 hour     Equipment Recommendations  Wheelchair (measurements PT);Wheelchair cushion (measurements PT);Hospital bed;Other (comment)(mechanical lift vs sliding board)    Recommendations for Other Services       Precautions / Restrictions Precautions Precautions: Fall;Back Restrictions Weight  Bearing Restrictions: No    Mobility  Bed Mobility               General bed mobility comments: pt requesting focus of session on LE HEP  Transfers                 General transfer comment: plan is for nursing staff to assist pt OOB later today with use of maximove lift  Ambulation/Gait                 Stairs             Wheelchair Mobility    Modified Rankin (Stroke Patients Only)       Balance                                            Cognition Arousal/Alertness: Awake/alert Behavior During Therapy: WFL for tasks assessed/performed Overall Cognitive Status: Within Functional Limits for tasks assessed                                        Exercises General Exercises - Lower Extremity Ankle Circles/Pumps: PROM;Both;10 reps;Other (comment)(pt using gait belt to perform with bilateral UEs) Quad Sets: Limitations Quad Sets Limitations: attempted; however, pt with 0/5 quad strength Heel Slides: PROM;Both;10 reps;Supine;Other (comment)(pt using gait belt to perform with bilateral UEs) Hip ABduction/ADduction: PROM;Both;Supine;Other (  comment)(pt using gait belt to perform with bilateral UEs)    General Comments        Pertinent Vitals/Pain Pain Assessment: No/denies pain    Home Living                      Prior Function            PT Goals (current goals can now be found in the care plan section) Acute Rehab PT Goals PT Goal Formulation: With patient Time For Goal Achievement: 07/16/19 Potential to Achieve Goals: Good Progress towards PT goals: Progressing toward goals    Frequency    Min 5X/week      PT Plan Current plan remains appropriate    Co-evaluation              AM-PAC PT "6 Clicks" Mobility   Outcome Measure  Help needed turning from your back to your side while in a flat bed without using bedrails?: A Lot Help needed moving from lying on your back to sitting  on the side of a flat bed without using bedrails?: Total Help needed moving to and from a bed to a chair (including a wheelchair)?: A Lot Help needed standing up from a chair using your arms (e.g., wheelchair or bedside chair)?: Total Help needed to walk in hospital room?: Total Help needed climbing 3-5 steps with a railing? : Total 6 Click Score: 8    End of Session   Activity Tolerance: Patient tolerated treatment well Patient left: in bed;with call bell/phone within reach;with family/visitor present Nurse Communication: Mobility status;Need for lift equipment PT Visit Diagnosis: Other abnormalities of gait and mobility (R26.89);Muscle weakness (generalized) (M62.81);History of falling (Z91.81);Other symptoms and signs involving the nervous system (R29.898)     Time: [id number] PT Time Calculation (min) (ACUTE ONLY): 28 min  Charges:  $Therapeutic Exercise: 8-22 mins $Self Care/Home Management: 8-22                     Anastasio Champion, DPT  Acute Rehabilitation Services Pager [phone] Office [place] 07/04/2019, 3:03 PM

## 2019-07-05 LAB — GLUCOSE, CAPILLARY
Glucose-Capillary: 174 mg/dL — ABNORMAL HIGH (ref 70–99)
Glucose-Capillary: 179 mg/dL — ABNORMAL HIGH (ref 70–99)
Glucose-Capillary: 251 mg/dL — ABNORMAL HIGH (ref 70–99)

## 2019-07-05 NOTE — Progress Notes (Signed)
NEUROSURGERY PROGRESS NOTE   Feeling ok this morning, paraplegic with no change in leg strength with some sensation. Continue therapies today. Therapy recommending CIR.   Temp:  [97.9 F (36.6 C)-98.7 F (37.1 C)] 98 F (36.7 C) (05/16 0719) Pulse Rate:  [60-85] 74 (05/16 0719) Resp:  [11-16] 15 (05/16 0719) BP: (105-141)/(67-81) 129/67 (05/16 0719) SpO2:  [96 %-100 %] 99 % (05/16 0719)  Eleonore Chiquito, NP 07/05/2019 9:13 AM

## 2019-07-05 NOTE — Evaluation (Signed)
Occupational Therapy Evaluation Patient Details Name: Timothy Davenport MRN: [id number] DOB: [birth year] Today's Date: 07/05/2019    History of Present Illness Pt is a 69 y.o. male with history of HTN, HDL, DM (last A1C <7%), ?recent TIA on plavix daily who presented to ED after a fall and inability to move BLE. 1 month ago pt developed significant RLE weakness, seeing PT without improvement in strength. Pt found to have large disc extrusion at T6-7 which results in severe spinal cord compression and associated T2 signal changes. Pt is now s/p T6, T7 laminectomy, right T6 and T7 pediculotomy for microdiscectomy at T6-7 on 5/12. Pt with worsening edema around the spinal cord and development of a hematoma post-operatively which was evacuated on 5/14.   Clinical Impression   Pt admitted with above. He demonstrates the below listed deficits and will benefit from continued OT to maximize safety and independence with BADLs.  Pt presents to OT with T6-T7 paraplegia.  He demonstrates impaired balance, decreased activity tolerance, and bil. LEs weakness. He requires set up to total A for ADLs. Worked on sitting balance EOB progressing to reach and mild balance challenges.  He lives with his wife and was independent PTA.  Recommend CIR>       Follow Up Recommendations  CIR;Supervision/Assistance - 24 hour    Equipment Recommendations  None recommended by OT    Recommendations for Other Services Rehab consult     Precautions / Restrictions Precautions Precautions: Fall;Back      Mobility Bed Mobility Overal bed mobility: Needs Assistance Bed Mobility: Rolling;Sidelying to Sit;Sit to Sidelying Rolling: Mod assist Sidelying to sit: Max assist     Sit to sidelying: Max assist General bed mobility comments: assist for LE. Pt able to assist with lifting trunk   Transfers                      Balance Overall balance assessment: Needs assistance Sitting-balance support: Bilateral upper  extremity supported;Feet supported Sitting balance-Leahy Scale: Poor Sitting balance - Comments: Pt requires UE support.  He is able to maintain static sitting with min A - mod A without UE support                                    ADL either performed or assessed with clinical judgement   ADL Overall ADL's : Needs assistance/impaired Eating/Feeding: Independent   Grooming: Wash/dry hands;Wash/dry face;Oral care;Brushing hair;Moderate assistance;Sitting Grooming Details (indicate cue type and reason): sitting EOB - assist for balance  Upper Body Bathing: Moderate assistance;Sitting Upper Body Bathing Details (indicate cue type and reason): EOB sitting - assist for balance  Lower Body Bathing: Maximal assistance;Bed level   Upper Body Dressing : Maximal assistance;Sitting;Bed level   Lower Body Dressing: Total assistance;Bed level   Toilet Transfer: Total assistance   Toileting- Clothing Manipulation and Hygiene: Total assistance;Bed level       Functional mobility during ADLs: Maximal assistance       Vision         Perception     Praxis      Pertinent Vitals/Pain Pain Assessment: No/denies pain     Hand Dominance Right   Extremity/Trunk Assessment Upper Extremity Assessment Upper Extremity Assessment: Overall WFL for tasks assessed   Lower Extremity Assessment Lower Extremity Assessment: Defer to PT evaluation   Cervical / Trunk Assessment Cervical / Trunk Assessment: Normal   Communication Communication  Communication: No difficulties   Cognition Arousal/Alertness: Awake/alert Behavior During Therapy: WFL for tasks assessed/performed Overall Cognitive Status: Within Functional Limits for tasks assessed                                     General Comments  VSS    Exercises Exercises: Other exercises Other Exercises Other Exercises: worked on sitting balance EOB. Began with bil. UE support progressed to reaching  unilatrally within BOS, simulating grooming while supporting self with unlateral UE, then hands on lap and shifting forward and back as well as side to side - required min - mod A , then sitting without UE support - required mod A    Shoulder Instructions      Home Living Family/patient expects to be discharged to:: Inpatient rehab                                        Prior Functioning/Environment Level of Independence: Independent        Comments: progressive weakness in RLE over last 2 months transitioning from cane to RW to wheelchair for mobility. Normally independent, gardens and fishes        OT Problem List: Decreased strength;Decreased range of motion;Impaired balance (sitting and/or standing);Decreased activity tolerance;Decreased knowledge of use of DME or AE      OT Treatment/Interventions: Self-care/ADL training;Therapeutic exercise;DME and/or AE instruction;Therapeutic activities;Patient/family education;Balance training;Neuromuscular education    OT Goals(Current goals can be found in the care plan section) Acute Rehab OT Goals Patient Stated Goal: to be able to walk  OT Goal Formulation: With patient Time For Goal Achievement: 07/19/19 Potential to Achieve Goals: Good  OT Frequency: Min 2X/week   Barriers to D/C:            Co-evaluation              AM-PAC OT "6 Clicks" Daily Activity     Outcome Measure Help from another person eating meals?: None Help from another person taking care of personal grooming?: A Lot Help from another person toileting, which includes using toliet, bedpan, or urinal?: Total Help from another person bathing (including washing, rinsing, drying)?: A Lot Help from another person to put on and taking off regular upper body clothing?: A Lot Help from another person to put on and taking off regular lower body clothing?: Total 6 Click Score: 12   End of Session Nurse Communication: Mobility status  Activity  Tolerance: Patient tolerated treatment well Patient left: in bed;with call bell/phone within reach  OT Visit Diagnosis: Muscle weakness (generalized) (M62.81)                Time: [id number] OT Time Calculation (min): 29 min Charges:  OT General Charges $OT Visit: 1 Visit OT Evaluation $OT Eval Moderate Complexity: 1 Mod OT Treatments $Neuromuscular Re-education: 8-22 mins  Nilsa Nutting., OTR/L Acute Rehabilitation Services Pager [phone] Office [phone]   Lucille Passy M 07/05/2019, 3:33 PM

## 2019-07-06 LAB — GLUCOSE, CAPILLARY
Glucose-Capillary: 207 mg/dL — ABNORMAL HIGH (ref 70–99)
Glucose-Capillary: 234 mg/dL — ABNORMAL HIGH (ref 70–99)
Glucose-Capillary: 174 mg/dL — ABNORMAL HIGH (ref 70–99)
Glucose-Capillary: 169 mg/dL — ABNORMAL HIGH (ref 70–99)
Glucose-Capillary: 177 mg/dL — ABNORMAL HIGH (ref 70–99)

## 2019-07-06 NOTE — PMR Pre-admission (Signed)
PMR Admission Coordinator Pre-Admission Assessment  Patient: Timothy Davenport is an 69 y.o., male MRN: [id number] DOB: 1950/07/22 Height: 5\' 10"  (177.8 cm) Weight: 79.8 kg              Insurance Information  Primary: [place] policy : [id number] Notified [facility] via portal [email or web address]    Discussed with Vic[facility] M[facility]   Secondary: Medicare part A only      Policy#: [id number]      Subscriber: pt Benefits:  Phone #: passport one online     Name: 5/17 Eff. Date: 04/19/2013     Deduct: $1484      Out of Pocket Max: none      Life Max: none  CIR: 100%      SNF: 20 full Outpatient: 80%     Co-Pay: 20% Home Health: 100%      Co-Pay: none DME: 80%     Co-Pay: 205 Providers: pt choice  Third: [place]      Policy#: [id number]    The "Data Collection Information Summary" for patients in Inpatient Rehabilitation Facilities with attached "Privacy Act [place] Records" was provided and verbally reviewed with: Patient and Family  Emergency Contact Information Contact Information    Name Relation Home Work Mobile   Aguillard,Hazel Spouse [phone]  [phone]     Current Medical History  Patient Admitting Diagnosis: incomplete paraplegia  History of Present Illness:  69 y.o. male with history of T2DM with neuropathy who started developing RLE weakness a month ago and recent TIA who was admitted on 06/30/19 after a fall with inability to move LLE and worsening of RLE. MRI brain negative for acute changes and incident right parotid glands neoplasms. 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 He was evaluated by Dr. Kathyrn Sheriff and taken to OR 5/12 for T6 & T7 laminectomy and right T6 & T7 pediculotomy for microdiskectomy at T6/T7.  Post op with hypotension and dizziness with  presyncope as well as urinary retention. He was started on IV decadron as follow MRI showed large hematoma in laminectomy bed and paraspinal soft tissues resulting in severe cord compression in conjunction to residual small disc protrusion and cord edema T5-mid T7. Due to neurological decline, he was taken back to OR on 5/14 for evacuation.   Past Medical History  Past Medical History:  Diagnosis Date  . Diabetes mellitus without complication ([place])   . Diabetic neuropathy ([place])     Family History  family history includes Hypertension in his father and mother.  Prior Rehab/Hospitalizations:  Has the patient had prior rehab or hospitalizations prior to admission? Yes  Has the patient had major surgery during 100 days prior to admission? Yes  Current Medications   Current Facility-Administered Medications:  .  0.9 %  sodium chloride infusion (Manually program via Guardrails IV Fluids), , Intravenous, Once, Consuella Lose, MD .  0.9 %  sodium chloride infusion, , Intravenous, Continuous, Costella, Vista Mink, PA-C, Stopped at 07/05/19 0800 .  0.9 %  sodium chloride infusion, , Intravenous, Continuous, Costella, Vincent J, PA-C .  acetaminophen (TYLENOL) tablet 650 mg, 650 mg, Oral, Q4H PRN **OR** acetaminophen (TYLENOL) suppository 650 mg, 650 mg, Rectal, Q4H PRN, Costella, Vincent J, PA-C .  amLODipine (NORVASC) tablet 10 mg, 10 mg, Oral, Daily, Costella, Vincent J, PA-C, 10 mg at 07/07/19 0904 .  bisacodyl (DULCOLAX)  suppository 10 mg, 10 mg, Rectal, Daily PRN, Costella, Vincent J, PA-C, 10 mg at 07/04/19 0845 .  Chlorhexidine Gluconate Cloth 2 % PADS 6 each, 6 each, Topical, Daily, Costella, Vista Mink, PA-C, 6 each at 07/07/19 0911 .  dexamethasone (DECADRON) injection 4 mg, 4 mg, Intravenous, Q6H, Consuella Lose, MD, 4 mg at 07/07/19 0539 .  docusate sodium (COLACE) capsule 100 mg, 100 mg, Oral, BID, Costella, Vincent J, PA-C, 100 mg at 07/07/19 0904 .  gabapentin (NEURONTIN)  capsule 300 mg, 300 mg, Oral, TID, Costella, Vincent J, PA-C, 300 mg at 07/07/19 0904 .  glipiZIDE (GLUCOTROL) tablet 5 mg, 5 mg, Oral, QAC breakfast, Costella, Vincent J, PA-C, 5 mg at 07/07/19 0904 .  hydrALAZINE (APRESOLINE) tablet 10 mg, 10 mg, Oral, BID, Costella, Vincent J, PA-C, 10 mg at 07/07/19 0904 .  HYDROcodone-acetaminophen (NORCO/VICODIN) 5-325 MG per tablet 1 tablet, 1 tablet, Oral, Q4H PRN, Costella, Vista Mink, PA-C, 1 tablet at 07/03/19 1229 .  HYDROmorphone (DILAUDID) injection 0.5-1 mg, 0.5-1 mg, Intravenous, Q2H PRN, Costella, Vincent J, PA-C .  insulin aspart (novoLOG) injection 0-15 Units, 0-15 Units, Subcutaneous, TID WC, Costella, Vincent Lenna Sciara, PA-C, 5 Units at 07/07/19 [id number] .  insulin aspart (novoLOG) injection 0-5 Units, 0-5 Units, Subcutaneous, QHS, Costella, Vincent Lenna Sciara, PA-C, 3 Units at 07/05/19 2153 .  lactated ringers infusion, , Intravenous, Continuous, Annye Asa, MD, Stopped at 07/05/19 0800 .  losartan (COZAAR) tablet 100 mg, 100 mg, Oral, Daily, Costella, Vincent J, PA-C, 100 mg at 07/07/19 0904 .  menthol-cetylpyridinium (CEPACOL) lozenge 3 mg, 1 lozenge, Oral, PRN **OR** phenol (CHLORASEPTIC) mouth spray 1 spray, 1 spray, Mouth/Throat, PRN, Costella, Vincent J, PA-C .  methocarbamol (ROBAXIN) tablet 500 mg, 500 mg, Oral, Q6H PRN **OR** methocarbamol (ROBAXIN) 500 mg in dextrose 5 % 50 mL IVPB, 500 mg, Intravenous, Q6H PRN, Costella, Vincent J, PA-C .  morphine 2 MG/ML injection 2 mg, 2 mg, Intravenous, Q2H PRN, Costella, Vincent J, PA-C, 2 mg at 07/01/19 0544 .  ondansetron (ZOFRAN) tablet 4 mg, 4 mg, Oral, Q6H PRN **OR** ondansetron (ZOFRAN) injection 4 mg, 4 mg, Intravenous, Q6H PRN, Costella, Vincent J, PA-C, 4 mg at 07/06/19 1841 .  oxyCODONE (Oxy IR/ROXICODONE) immediate release tablet 5-10 mg, 5-10 mg, Oral, Q3H PRN, Costella, Vincent J, PA-C, 10 mg at 07/06/19 2040 .  polyethylene glycol (MIRALAX / GLYCOLAX) packet 17 g, 17 g, Oral, Daily PRN, Costella,  Vincent J, PA-C .  pravastatin (PRAVACHOL) tablet 20 mg, 20 mg, Oral, Daily, Costella, Vincent J, PA-C, 20 mg at 07/07/19 0903 .  senna (SENOKOT) tablet 8.6 mg, 1 tablet, Oral, BID, Costella, Vincent J, PA-C, 8.6 mg at 07/07/19 0903 .  senna-docusate (Senokot-S) tablet 1 tablet, 1 tablet, Oral, QHS PRN, Costella, Vincent J, PA-C .  sodium phosphate (FLEET) 7-19 GM/118ML enema 1 enema, 1 enema, Rectal, Once PRN, Costella, Vista Mink, PA-C .  tamsulosin (FLOMAX) capsule 0.4 mg, 0.4 mg, Oral, Daily, Bergman, Meghan D, NP, 0.4 mg at 07/07/19 0903 .  zolpidem (AMBIEN) tablet 5 mg, 5 mg, Oral, QHS PRN, Costella, Vista Mink, PA-C  Patients Current Diet:  Diet Order            Diet regular Room service appropriate? Yes; Fluid consistency: Thin  Diet effective now              Precautions / Restrictions Precautions Precautions: Fall, Back Precaution Booklet Issued: No Precaution Comments: pt able to recall 3/3 back precautions Restrictions Weight Bearing Restrictions: No   Has  the patient had 2 or more falls or a fall with injury in the past year?Yes  Prior Activity Level Community (5-7x/wk): Independent, driving, retired, active  Prior Functional Level Prior Function Level of Independence: Independent Comments: progressive weakness in RLE over last 2 months transitioning from cane to RW to wheelchair for mobility. Normally independent, gardens and fishes  Self Care: Did the patient need help bathing, dressing, using the toilet or eating?  Independent  Indoor Mobility: Did the patient need assistance with walking from room to room (with or without device)? Independent  Stairs: Did the patient need assistance with internal or external stairs (with or without device)? Independent  Functional Cognition: Did the patient need help planning regular tasks such as shopping or remembering to take medications? Union / Equipment Home Equipment: Environmental consultant - 2 wheels,  Cane - single point, Wheelchair - manual, Shower seat  Prior Device Use: Indicate devices/aids used by the patient prior to current illness, exacerbation or injury? Was Independent without AD, began using cane and then to wheelchair on admit  Current Functional Level Cognition  Overall Cognitive Status: Within Functional Limits for tasks assessed Orientation Level: Oriented X4 General Comments: pt very motivated to get better    Extremity Assessment (includes Sensation/Coordination)  Upper Extremity Assessment: Overall WFL for tasks assessed  Lower Extremity Assessment: Defer to PT evaluation RLE Deficits / Details: flaccid, 0/5, sensation WFL LLE Deficits / Details: flaccid, 0/5, sensation WFL    ADLs  Overall ADL's : Needs assistance/impaired Eating/Feeding: Independent Grooming: Wash/dry hands, Wash/dry face, Oral care, Brushing hair, Moderate assistance, Sitting Grooming Details (indicate cue type and reason): sitting EOB - assist for balance  Upper Body Bathing: Moderate assistance, Sitting Upper Body Bathing Details (indicate cue type and reason): EOB sitting - assist for balance  Lower Body Bathing: Maximal assistance, Bed level Upper Body Dressing : Maximal assistance, Sitting, Bed level Lower Body Dressing: Total assistance, Bed level Toilet Transfer: Total assistance Toileting- Clothing Manipulation and Hygiene: Total assistance, Bed level Functional mobility during ADLs: Maximal assistance    Mobility  Overal bed mobility: Needs Assistance Bed Mobility: Rolling, Sidelying to Sit Rolling: Min assist(heavy use of bed rail) Sidelying to sit: Max assist Sit to sidelying: Max assist General bed mobility comments: pt used bed rail to pull self over to R sidelying. maxA for LE management and modA for trunk elevation, pt with great effort and heavy reliance on bed rail    Transfers  Overall transfer level: Needs assistance Equipment used: None Transfers: Lateral/Scoot  Transfers  Lateral/Scoot Transfers: Mod assist General transfer comment: pt able to lateral scoot to recliner with max directional cues and modA via bed pad to assist in transfer    Ambulation / Gait / Stairs / Wheelchair Mobility  Ambulation/Gait General Gait Details: unable, pt paraplegic    Posture / Balance Dynamic Sitting Balance Sitting balance - Comments: pt able to maintain EOB sitting balance with hands on knees with close supervision, however when asked to sit up straight or to move hands pt requiring maxA to prevent fall Balance Overall balance assessment: Needs assistance Sitting-balance support: Bilateral upper extremity supported Sitting balance-Leahy Scale: Poor Sitting balance - Comments: pt able to maintain EOB sitting balance with hands on knees with close supervision, however when asked to sit up straight or to move hands pt requiring maxA to prevent fall Postural control: Left lateral lean    Special needs/care consideration Bladder management 16 FR catheter placed 5/14 Bowel management incontinent;  needs bowel regimen Designated visitors are wife, [name] and son , Clifton James Skin with surgical incision   Previous Home Environment  Living Arrangements: Spouse/significant other  Lives With: Spouse Available Help at Discharge: Family, Available 24 hours/day Type of Home: House Home Layout: One level Home Access: Stairs to enter Entrance Stairs-Rails: None Entrance Stairs-Number of Steps: 1 Bathroom Shower/Tub: Optometrist: Yes How Accessible: Accessible via walker [place]: Yes Type of Home Care Services: Home PT  Discharge Living Setting Plans for Discharge Living Setting: Patient's home, Lives with (comment)(wife) Type of Home at Discharge: House Discharge Home Layout: One level Discharge Home Access: Stairs to enter Entrance Stairs-Rails: None Entrance Stairs-Number of Steps: 1 Discharge  Bathroom Shower/Tub: Tub/shower unit Discharge Bathroom Toilet: Standard Discharge Bathroom Accessibility: Yes How Accessible: Accessible via walker Does the patient have any problems obtaining your medications?: No  Social/Family/Support Systems Patient Roles: Spouse, Parent(has 3 children) 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, Equities trader Anticipated Caregiver: wife and adult children Anticipated Caregiver's Contact Information: see above Caregiver Availability: 24/7 Is Caregiver In Agreement with Plan?: Yes Does Caregiver/Family have Issues with Lodging/Transportation while Pt is in Rehab?: Yes  Goals Patient/Family Goal for Rehab: supervision to min PT and OT at wheelchair level Expected length of stay: ELOS 20 to 25 days Pt/Family Agrees to Admission and willing to participate: Yes Program Orientation Provided & Reviewed with Pt/Caregiver Including Roles  & Responsibilities: Yes  Patient has already contacted [facility] a few weeks ago to have ramp built and bathroom made handicapped accessible.  Decrease burden of Care through IP rehab admission: n/a  Possible need for SNF placement upon discharge:not anticipated  Patient Condition: This patient's condition remains as documented in the consult dated 07/06/2019, in which the Rehabilitation Physician determined and documented that the patient's condition is appropriate for intensive rehabilitative care in an inpatient rehabilitation facility. Will admit to inpatient rehab today.  Preadmission Screen Completed By:  Cleatrice Burke, RN, 07/07/2019 12:08 PM ______________________________________________________________________   Discussed status with Dr. Dagoberto Ligas on 07/07/2019 at  1208 and received approval for admission today.  Admission Coordinator:  Cleatrice Burke, time [id number] Date 07/07/2019

## 2019-07-06 NOTE — Progress Notes (Signed)
Orthopedic Tech Progress Note Patient Details:  Timothy Davenport [birth year] [id number] Called in order to [facility] for BLE PRAFOS Patient ID: [name], male   DOB: [birth year], 69 y.o.   MRN: [id number]   Janit Pagan 07/06/2019, 11:39 AM

## 2019-07-06 NOTE — Progress Notes (Signed)
  NEUROSURGERY PROGRESS NOTE   Updated patient's wife regarding plan of care moving forward.  Time spent 5 min

## 2019-07-06 NOTE — Progress Notes (Signed)
Inpatient Diabetes Program Recommendations  AACE/ADA: New Consensus Statement on Inpatient Glycemic Control (2015)  Target Ranges:  Prepandial:   less than 140 mg/dL      Peak postprandial:   less than 180 mg/dL (1-2 hours)      Critically ill patients:  140 - 180 mg/dL   Lab Results  Component Value Date   GLUCAP 251 (H) 07/05/2019   HGBA1C 6.0 (H) 06/30/2019    Review of Glycemic Control Results for Timothy Davenport, Timothy Davenport" (MRN [id number]) as of 07/06/2019 11:37  Ref. Range 07/05/2019 21:48  Glucose-Capillary Latest Ref Range: 70 - 99 mg/dL 251 (H)   Diabetes history: Type 2 DM Outpatient Diabetes medications: Glipizide 5 mg QAM, Actos 30 mg QD Current orders for Inpatient glycemic control: Glipizide 5 mg QAM, Novolog 0-15 units TID, Novolog 0-5 units QHS Decadron 4 mg Q6H  Inpatient Diabetes Program Recommendations:    In the setting of steroids, consider adding Levemir 10 units QD. Additionally, may want to consider changing diet to carb modified.   Thanks, Bronson Curb, MSN, RNC-OB Diabetes Coordinator [phone] (8a-5p)

## 2019-07-06 NOTE — Progress Notes (Signed)
Inpatient Rehabilitation Admissions Coordinator  Inpatient rehab consult received. I met with patient , wife and son, Timothy Davenport at bedside. We dicussed goals and expectations of an inpt rehab admit. They are in agreement. I will follow up tomorrow with bed availability and plan admission.  Danne Baxter, RN, MSN Rehab Admissions Coordinator [phone] 07/06/2019 12:48 PM

## 2019-07-06 NOTE — Progress Notes (Signed)
Physical Therapy Treatment Patient Details Name: Timothy Davenport MRN: [id number] DOB: 1950-10-27 Today's Date: 07/06/2019    History of Present Illness Pt is a 69 y.o. male with history of HTN, HDL, DM (last A1C <7%), ?recent TIA on plavix daily who presented to ED after a fall and inability to move BLE. 1 month ago pt developed significant RLE weakness, seeing PT without improvement in strength. Pt found to have large disc extrusion at T6-7 which results in severe spinal cord compression and associated T2 signal changes. Pt is now s/p T6, T7 laminectomy, right T6 and T7 pediculotomy for microdiscectomy at T6-7 on 5/12. Pt with worsening edema around the spinal cord and development of a hematoma post-operatively which was evacuated on 5/14.    PT Comments    Pt with improved EOB balance. Pt very motivated and continuous to put forth great effort. Pt improving with his lateral scoot transfer. Will progress to w/c transfers and w/c mobility as it appears the wheel chair will be patients primary mode of mobility. Continue to recommend CIR for intense rehab program to become safe mod I w/c level. Acute PT to cont to follow.    Follow Up Recommendations  CIR;Supervision/Assistance - 24 hour     Equipment Recommendations  Wheelchair (measurements PT);Wheelchair cushion (measurements PT);Hospital bed;Other (comment)    Recommendations for Other Services       Precautions / Restrictions Precautions Precautions: Fall;Back Precaution Booklet Issued: No Precaution Comments: pt able to recall 3/3 back precautions Restrictions Weight Bearing Restrictions: No    Mobility  Bed Mobility Overal bed mobility: Needs Assistance Bed Mobility: Rolling;Sidelying to Sit Rolling: Min assist(heavy use of bed rail) Sidelying to sit: Max assist       General bed mobility comments: pt used bed rail to pull self over to R sidelying. maxA for LE management and modA for trunk elevation, pt with great effort  and heavy reliance on bed rail  Transfers Overall transfer level: Needs assistance Equipment used: None Transfers: Lateral/Scoot Transfers          Lateral/Scoot Transfers: Mod assist General transfer comment: pt able to lateral scoot to recliner with max directional cues and modA via bed pad to assist in transfer  Ambulation/Gait             General Gait Details: unable, pt paraplegic   Stairs             Wheelchair Mobility    Modified Rankin (Stroke Patients Only)       Balance Overall balance assessment: Needs assistance Sitting-balance support: Bilateral upper extremity supported Sitting balance-Leahy Scale: Poor Sitting balance - Comments: pt able to maintain EOB sitting balance with hands on knees with close supervision, however when asked to sit up straight or to move hands pt requiring maxA to prevent fall Postural control: Left lateral lean                                  Cognition Arousal/Alertness: Awake/alert Behavior During Therapy: WFL for tasks assessed/performed Overall Cognitive Status: Within Functional Limits for tasks assessed                                 General Comments: pt very motivated to get better      Exercises Other Exercises Other Exercises: passive ROM to bilat LEs to hip/knees/ankles x 10 reps  each    General Comments General comments (skin integrity, edema, etc.): VSS      Pertinent Vitals/Pain Pain Assessment: No/denies pain    Home Living     Available Help at Discharge: (P) Family;Available 24 hours/day                Prior Function            PT Goals (current goals can now be found in the care plan section) Progress towards PT goals: Progressing toward goals    Frequency    Min 5X/week      PT Plan Current plan remains appropriate    Co-evaluation              AM-PAC PT "6 Clicks" Mobility   Outcome Measure  Help needed turning from your back  to your side while in a flat bed without using bedrails?: A Lot Help needed moving from lying on your back to sitting on the side of a flat bed without using bedrails?: Total Help needed moving to and from a bed to a chair (including a wheelchair)?: A Lot Help needed standing up from a chair using your arms (e.g., wheelchair or bedside chair)?: Total Help needed to walk in hospital room?: Total Help needed climbing 3-5 steps with a railing? : Total 6 Click Score: 8    End of Session Equipment Utilized During Treatment: Gait belt Activity Tolerance: Patient tolerated treatment well Patient left: in chair;with call bell/phone within reach;with chair alarm set Nurse Communication: Mobility status(how to laterally scoot pt over) PT Visit Diagnosis: Other abnormalities of gait and mobility (R26.89);Muscle weakness (generalized) (M62.81);History of falling (Z91.81);Other symptoms and signs involving the nervous system (R29.898)     Time: [id number] PT Time Calculation (min) (ACUTE ONLY): 23 min  Charges:  $Therapeutic Activity: 8-22 mins $Neuromuscular Re-education: 8-22 mins                     Kittie Plater, PT, DPT Acute Rehabilitation Services Pager #: [phone] Office #: [phone]    Berline Lopes 07/06/2019, 1:47 PM

## 2019-07-06 NOTE — Progress Notes (Signed)
  NEUROSURGERY PROGRESS NOTE   No issues overnight.  No concerns this am  EXAM:  BP 127/82 (BP Location: Left Arm)   Pulse 62   Temp 98.2 F (36.8 C) (Oral)   Resp 17   Ht 5\' 10"  (1.778 m)   Wt 79.8 kg   SpO2 100%   BMI 25.25 kg/m   Awake, alert, oriented  Speech fluent, appropriate  CN grossly intact  5/5 BUE, 0/5 BLE Sensation does appear intact  IMPRESSION/PLAN 69 y.o. male s/p thoracic laminectomy microdiscectomy [id number], complicated by thoracic hematoma s/p evacuation 5/14. Remains paraplegic. CIR candidate - dispo planning - will update wife at patient's request

## 2019-07-07 ENCOUNTER — Inpatient Hospital Stay (HOSPITAL_COMMUNITY)
Admission: RE | Admit: 2019-07-07 | Discharge: 2019-07-20 | DRG: 560 | Disposition: A | Discharge status: Home Health | Payer: Medicare Other | Source: Intra-hospital | Attending: Physical Medicine and Rehabilitation | Admitting: Physical Medicine and Rehabilitation

## 2019-07-07 ENCOUNTER — Encounter (HOSPITAL_COMMUNITY): Payer: Self-pay | Admitting: Neurosurgery

## 2019-07-07 ENCOUNTER — Encounter (HOSPITAL_COMMUNITY): Payer: Self-pay | Admitting: Physical Medicine and Rehabilitation

## 2019-07-07 ENCOUNTER — Other Ambulatory Visit: Payer: Self-pay

## 2019-07-07 ENCOUNTER — Inpatient Hospital Stay (HOSPITAL_COMMUNITY): Payer: Medicare Other

## 2019-07-07 ENCOUNTER — Inpatient Hospital Stay (HOSPITAL_COMMUNITY): Payer: No Typology Code available for payment source

## 2019-07-07 DIAGNOSIS — R296 Repeated falls: Secondary | ICD-10-CM | POA: Diagnosis present

## 2019-07-07 DIAGNOSIS — K5900 Constipation, unspecified: Secondary | ICD-10-CM

## 2019-07-07 DIAGNOSIS — M4714 Other spondylosis with myelopathy, thoracic region: Secondary | ICD-10-CM | POA: Diagnosis present

## 2019-07-07 DIAGNOSIS — K592 Neurogenic bowel, not elsewhere classified: Secondary | ICD-10-CM

## 2019-07-07 DIAGNOSIS — N1831 Chronic kidney disease, stage 3a: Secondary | ICD-10-CM | POA: Diagnosis present

## 2019-07-07 DIAGNOSIS — I129 Hypertensive chronic kidney disease with stage 1 through stage 4 chronic kidney disease, or unspecified chronic kidney disease: Secondary | ICD-10-CM | POA: Diagnosis present

## 2019-07-07 DIAGNOSIS — T380X5A Adverse effect of glucocorticoids and synthetic analogues, initial encounter: Secondary | ICD-10-CM | POA: Diagnosis present

## 2019-07-07 DIAGNOSIS — K219 Gastro-esophageal reflux disease without esophagitis: Secondary | ICD-10-CM | POA: Diagnosis present

## 2019-07-07 DIAGNOSIS — N319 Neuromuscular dysfunction of bladder, unspecified: Secondary | ICD-10-CM

## 2019-07-07 DIAGNOSIS — N390 Urinary tract infection, site not specified: Secondary | ICD-10-CM | POA: Diagnosis present

## 2019-07-07 DIAGNOSIS — Z8673 Personal history of transient ischemic attack (TIA), and cerebral infarction without residual deficits: Secondary | ICD-10-CM

## 2019-07-07 DIAGNOSIS — R252 Cramp and spasm: Secondary | ICD-10-CM | POA: Diagnosis present

## 2019-07-07 DIAGNOSIS — D62 Acute posthemorrhagic anemia: Secondary | ICD-10-CM | POA: Diagnosis present

## 2019-07-07 DIAGNOSIS — R509 Fever, unspecified: Secondary | ICD-10-CM

## 2019-07-07 DIAGNOSIS — B965 Pseudomonas (aeruginosa) (mallei) (pseudomallei) as the cause of diseases classified elsewhere: Secondary | ICD-10-CM | POA: Diagnosis present

## 2019-07-07 DIAGNOSIS — Z4789 Encounter for other orthopedic aftercare: Principal | ICD-10-CM

## 2019-07-07 DIAGNOSIS — G8222 Paraplegia, incomplete: Secondary | ICD-10-CM

## 2019-07-07 DIAGNOSIS — E876 Hypokalemia: Secondary | ICD-10-CM | POA: Diagnosis present

## 2019-07-07 DIAGNOSIS — E1122 Type 2 diabetes mellitus with diabetic chronic kidney disease: Secondary | ICD-10-CM | POA: Diagnosis present

## 2019-07-07 DIAGNOSIS — E1140 Type 2 diabetes mellitus with diabetic neuropathy, unspecified: Secondary | ICD-10-CM | POA: Diagnosis present

## 2019-07-07 DIAGNOSIS — H04123 Dry eye syndrome of bilateral lacrimal glands: Secondary | ICD-10-CM | POA: Diagnosis present

## 2019-07-07 DIAGNOSIS — G8220 Paraplegia, unspecified: Secondary | ICD-10-CM | POA: Diagnosis present

## 2019-07-07 DIAGNOSIS — R609 Edema, unspecified: Secondary | ICD-10-CM

## 2019-07-07 LAB — COMPREHENSIVE METABOLIC PANEL WITH GFR
ALT: 31 U/L (ref 0–44)
AST: 28 U/L (ref 15–41)
Albumin: 2.4 g/dL — ABNORMAL LOW (ref 3.5–5.0)
Alkaline Phosphatase: 47 U/L (ref 38–126)
Anion gap: 6 (ref 5–15)
BUN: 26 mg/dL — ABNORMAL HIGH (ref 8–23)
CO2: 29 mmol/L (ref 22–32)
Calcium: 9.3 mg/dL (ref 8.9–10.3)
Chloride: 99 mmol/L (ref 98–111)
Creatinine, Ser: 1.13 mg/dL (ref 0.61–1.24)
GFR calc Af Amer: >60 mL/min
GFR calc non Af Amer: >60 mL/min
Glucose, Bld: 119 mg/dL — ABNORMAL HIGH (ref 70–99)
Potassium: 3.6 mmol/L (ref 3.5–5.1)
Sodium: 134 mmol/L — ABNORMAL LOW (ref 135–145)
Total Bilirubin: 0.7 mg/dL (ref 0.3–1.2)
Total Protein: 5.6 g/dL — ABNORMAL LOW (ref 6.5–8.1)

## 2019-07-07 LAB — GLUCOSE, CAPILLARY
Glucose-Capillary: 222 mg/dL — ABNORMAL HIGH (ref 70–99)
Glucose-Capillary: 245 mg/dL — ABNORMAL HIGH (ref 70–99)
Glucose-Capillary: 167 mg/dL — ABNORMAL HIGH (ref 70–99)
Glucose-Capillary: 206 mg/dL — ABNORMAL HIGH (ref 70–99)

## 2019-07-07 LAB — CBC WITH DIFFERENTIAL/PLATELET
Abs Immature Granulocytes: 0.4 10*3/uL — ABNORMAL HIGH (ref 0.00–0.07)
Basophils Absolute: 0 10*3/uL (ref 0.0–0.1)
Basophils Relative: 0 %
Eosinophils Absolute: 0 10*3/uL (ref 0.0–0.5)
Eosinophils Relative: 0 %
HCT: 34.5 % — ABNORMAL LOW (ref 39.0–52.0)
Hemoglobin: 11.4 g/dL — ABNORMAL LOW (ref 13.0–17.0)
Immature Granulocytes: 3 %
Lymphocytes Relative: 7 %
Lymphs Abs: 0.9 10*3/uL (ref 0.7–4.0)
MCH: 27.9 pg (ref 26.0–34.0)
MCHC: 33 g/dL (ref 30.0–36.0)
MCV: 84.4 fL (ref 80.0–100.0)
Monocytes Absolute: 0.9 10*3/uL (ref 0.1–1.0)
Monocytes Relative: 7 %
Neutro Abs: 11 10*3/uL — ABNORMAL HIGH (ref 1.7–7.7)
Neutrophils Relative %: 83 %
Platelets: 141 10*3/uL — ABNORMAL LOW (ref 150–400)
RBC: 4.09 MIL/uL — ABNORMAL LOW (ref 4.22–5.81)
RDW: 14.6 % (ref 11.5–15.5)
WBC: 13.2 10*3/uL — ABNORMAL HIGH (ref 4.0–10.5)
nRBC: 0.3 % — ABNORMAL HIGH (ref 0.0–0.2)

## 2019-07-07 IMAGING — DX DG ABD PORTABLE 1V
1 series · 2 of 2 positions shown · non-contrast
Comparison: None.

CLINICAL DATA: Clinical constipation.

EXAM:
PORTABLE ABDOMEN - 1 VIEW

[Series 1: abdomen · 0.14mm/px · 2 of 2 slices shown]
[im 1/2]
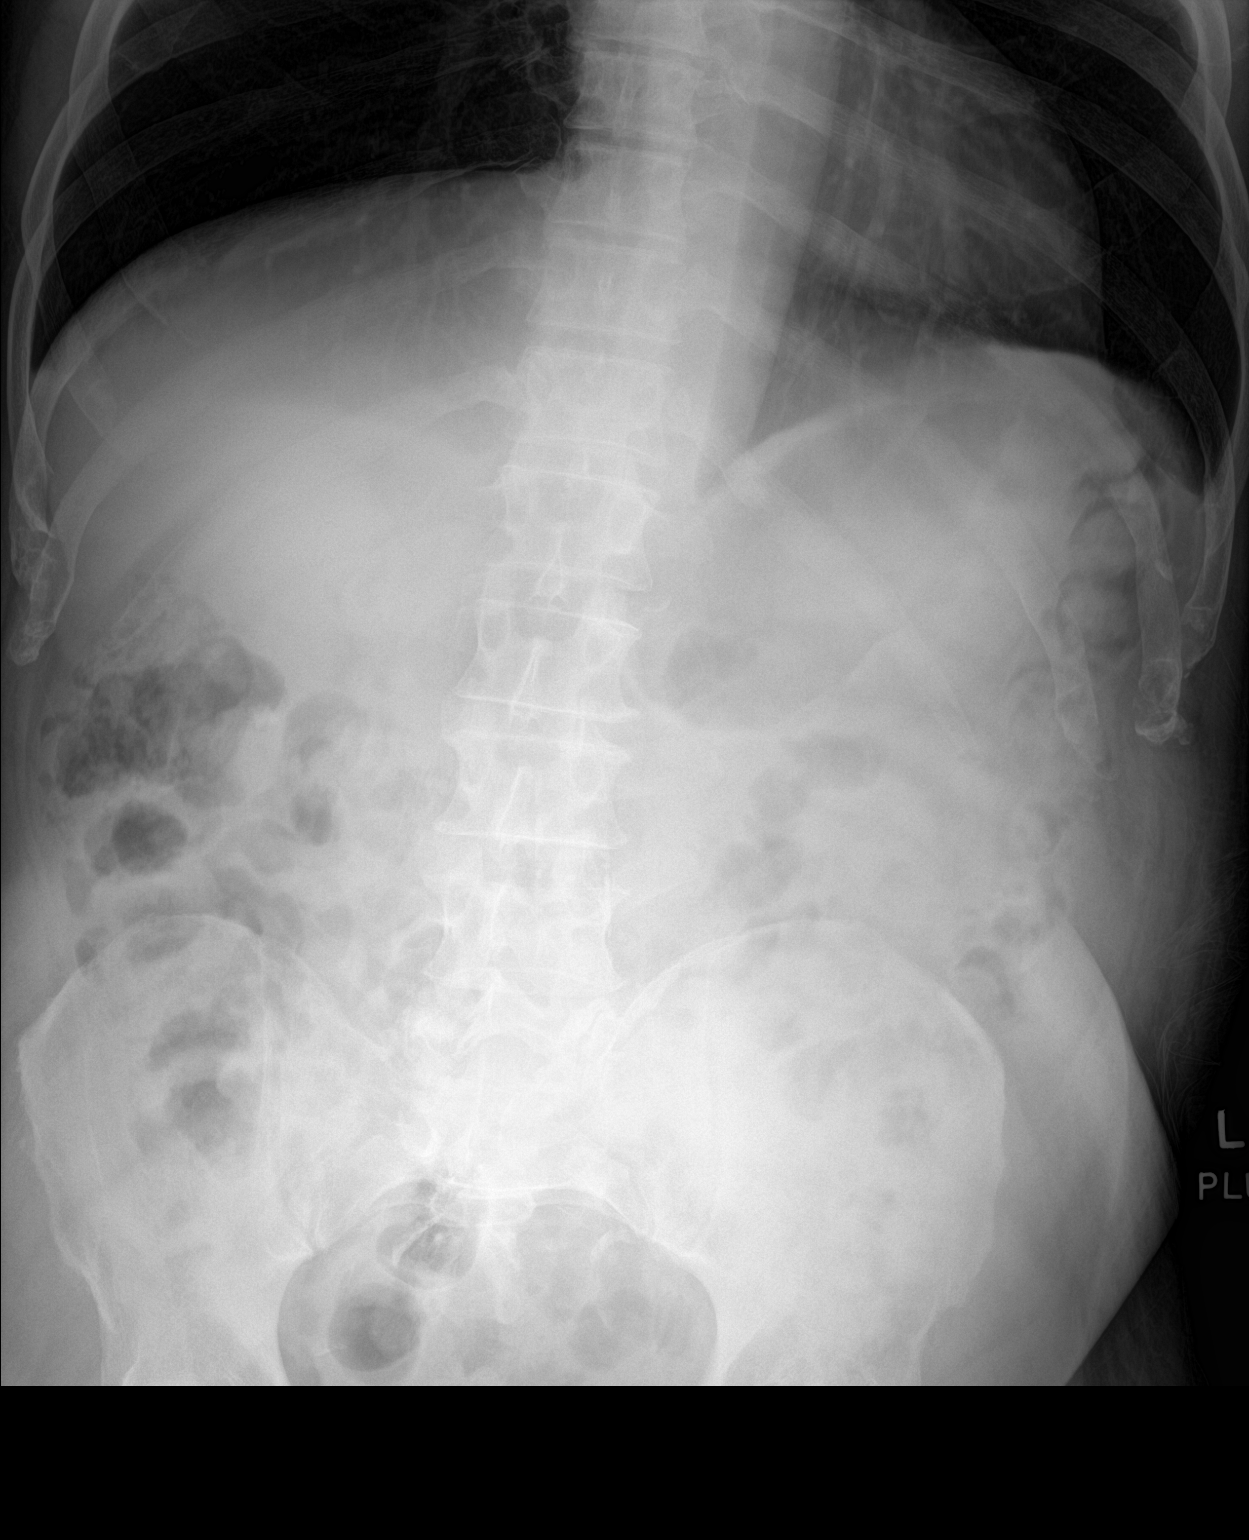
[im 2/2]
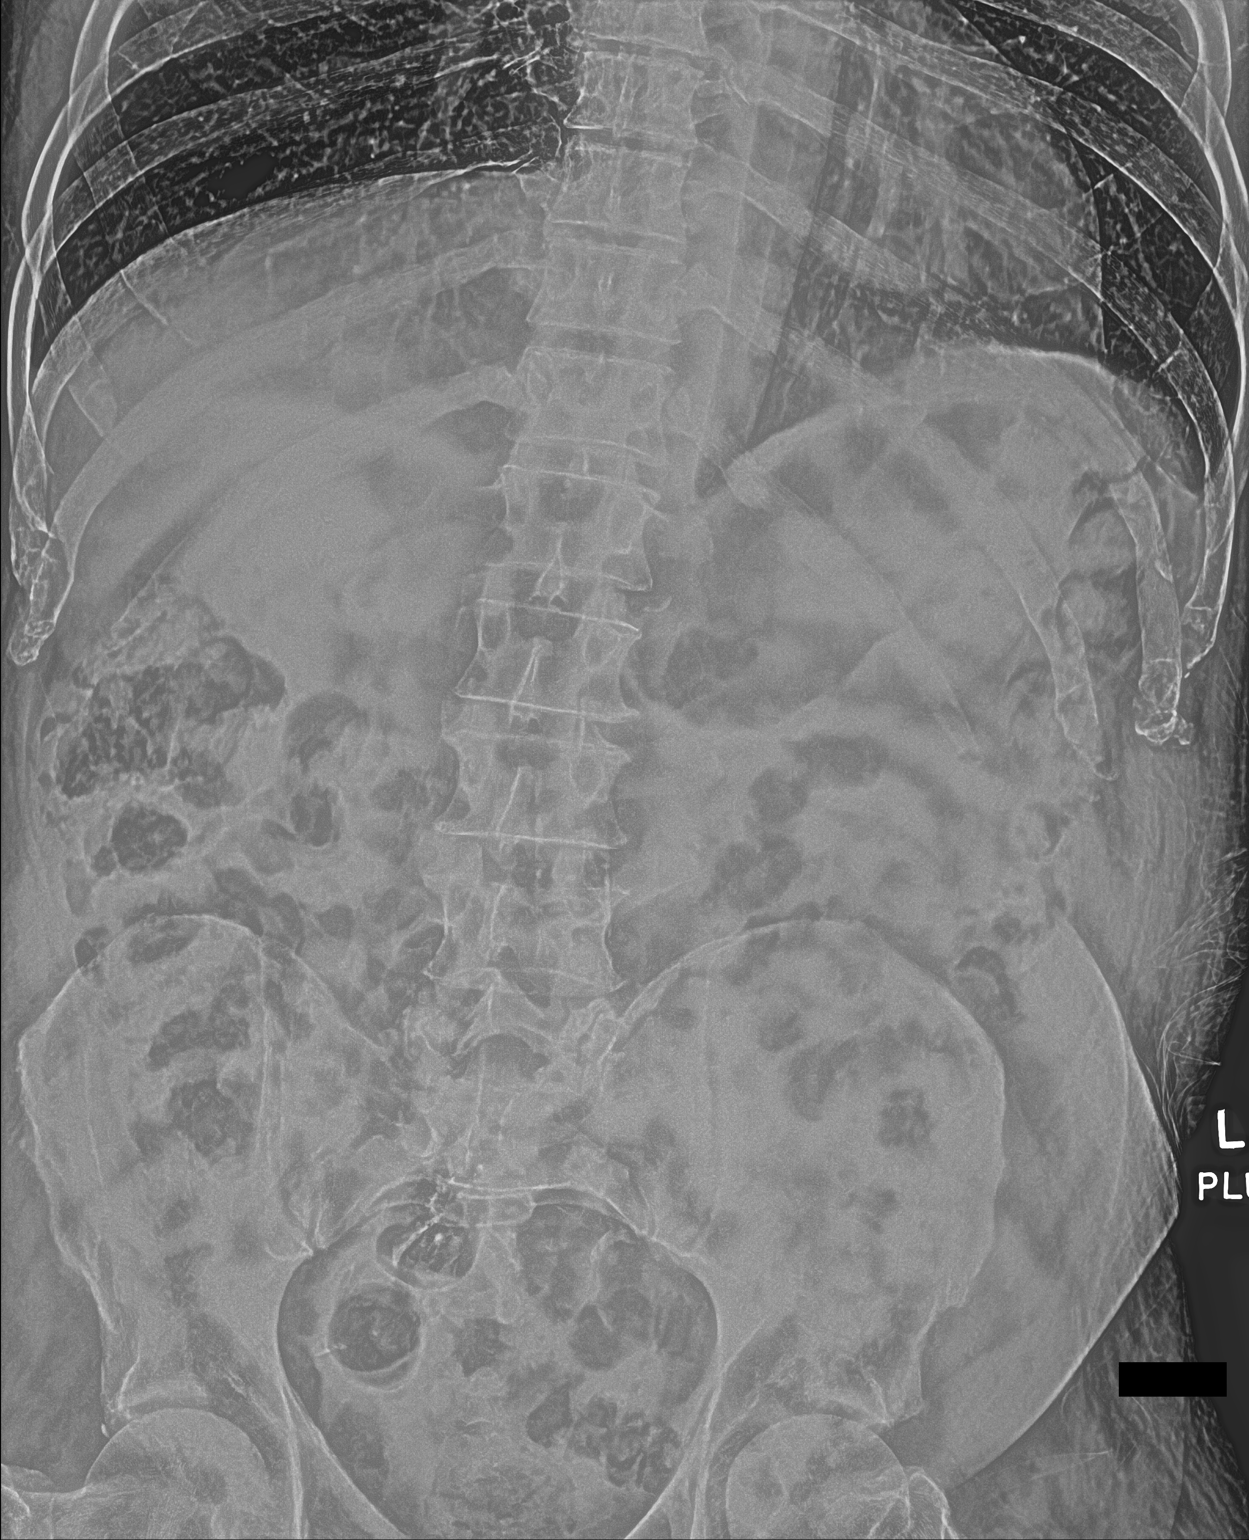

[2 of 2 positions shown; findings below may reference images not displayed]

FINDINGS: Supine abdomen shows no gaseous small bowel or colonic distention.
Small to moderate stool volume throughout the colon. Visualized bony
anatomy unremarkable.
IMPRESSION: Small to moderate stool volume.

## 2019-07-07 MED ORDER — PRAVASTATIN SODIUM 10 MG PO TABS
20.0000 mg | ORAL_TABLET | Freq: Every day | ORAL | Status: DC
  Administered 2019-07-08 – 2019-07-20 (×13): 20 mg via ORAL
  Filled 2019-07-07 (×13): qty 2

## 2019-07-07 MED ORDER — METHYLPREDNISOLONE 4 MG PO TBPK: ORAL_TABLET | ORAL | 0 refills | Status: DC

## 2019-07-07 MED ORDER — PROCHLORPERAZINE MALEATE 5 MG PO TABS: 5.0000 mg | ORAL_TABLET | Freq: Four times a day (QID) | ORAL | Status: DC | PRN

## 2019-07-07 MED ORDER — DEXAMETHASONE 4 MG PO TABS
4.0000 mg | ORAL_TABLET | Freq: Every day | ORAL | Status: AC
  Administered 2019-07-13 – 2019-07-15 (×3): 4 mg via ORAL
  Filled 2019-07-07 (×3): qty 1

## 2019-07-07 MED ORDER — GLIPIZIDE 5 MG PO TABS
5.0000 mg | ORAL_TABLET | Freq: Every day | ORAL | Status: DC
  Administered 2019-07-08 – 2019-07-14 (×7): 5 mg via ORAL
  Filled 2019-07-07 (×7): qty 1

## 2019-07-07 MED ORDER — DEXAMETHASONE 4 MG PO TABS
4.0000 mg | ORAL_TABLET | Freq: Two times a day (BID) | ORAL | Status: AC
  Administered 2019-07-10 – 2019-07-12 (×6): 4 mg via ORAL
  Filled 2019-07-07 (×6): qty 1

## 2019-07-07 MED ORDER — FLEET ENEMA 7-19 GM/118ML RE ENEM: 1.0000 | ENEMA | Freq: Once | RECTAL | Status: DC | PRN

## 2019-07-07 MED ORDER — BISACODYL 10 MG RE SUPP: 10.0000 mg | Freq: Every day | RECTAL | Status: DC | PRN

## 2019-07-07 MED ORDER — FLEET ENEMA 7-19 GM/118ML RE ENEM
1.0000 | ENEMA | Freq: Once | RECTAL | Status: AC
  Administered 2019-07-07: 1 via RECTAL
  Filled 2019-07-07: qty 1

## 2019-07-07 MED ORDER — MENTHOL 3 MG MT LOZG: 1.0000 | LOZENGE | OROMUCOSAL | Status: DC | PRN

## 2019-07-07 MED ORDER — POLYETHYLENE GLYCOL 3350 17 G PO PACK
17.0000 g | PACK | Freq: Once | ORAL | Status: AC
  Administered 2019-07-07: 17 g via ORAL
  Filled 2019-07-07: qty 1

## 2019-07-07 MED ORDER — CYCLOBENZAPRINE HCL 5 MG PO TABS: 5.0000 mg | ORAL_TABLET | Freq: Three times a day (TID) | ORAL | Status: DC | PRN

## 2019-07-07 MED ORDER — PIOGLITAZONE HCL 30 MG PO TABS
30.0000 mg | ORAL_TABLET | Freq: Every day | ORAL | Status: DC
  Administered 2019-07-07 – 2019-07-20 (×14): 30 mg via ORAL
  Filled 2019-07-07 (×15): qty 1

## 2019-07-07 MED ORDER — DIPHENHYDRAMINE HCL 12.5 MG/5ML PO ELIX: 12.5000 mg | ORAL_SOLUTION | Freq: Four times a day (QID) | ORAL | Status: DC | PRN

## 2019-07-07 MED ORDER — ACETAMINOPHEN 325 MG PO TABS
325.0000 mg | ORAL_TABLET | ORAL | Status: DC | PRN
  Administered 2019-07-16: 650 mg via ORAL
  Filled 2019-07-07 (×2): qty 2

## 2019-07-07 MED ORDER — PROCHLORPERAZINE EDISYLATE 10 MG/2ML IJ SOLN: 5.0000 mg | Freq: Four times a day (QID) | INTRAMUSCULAR | Status: DC | PRN

## 2019-07-07 MED ORDER — INSULIN ASPART 100 UNIT/ML ~~LOC~~ SOLN
0.0000 [IU] | Freq: Every day | SUBCUTANEOUS | Status: DC
  Administered 2019-07-07 – 2019-07-09 (×3): 2 [IU] via SUBCUTANEOUS
  Administered 2019-07-12: 5 [IU] via SUBCUTANEOUS
  Administered 2019-07-13: 3 [IU] via SUBCUTANEOUS
  Administered 2019-07-14 – 2019-07-19 (×3): 2 [IU] via SUBCUTANEOUS

## 2019-07-07 MED ORDER — SENNOSIDES-DOCUSATE SODIUM 8.6-50 MG PO TABS
2.0000 | ORAL_TABLET | Freq: Every day | ORAL | Status: DC
  Administered 2019-07-08 – 2019-07-20 (×13): 2 via ORAL
  Filled 2019-07-07 (×13): qty 2

## 2019-07-07 MED ORDER — AMLODIPINE BESYLATE 10 MG PO TABS
10.0000 mg | ORAL_TABLET | Freq: Every day | ORAL | Status: DC
  Administered 2019-07-08 – 2019-07-20 (×12): 10 mg via ORAL
  Filled 2019-07-07 (×13): qty 1

## 2019-07-07 MED ORDER — TAMSULOSIN HCL 0.4 MG PO CAPS
0.4000 mg | ORAL_CAPSULE | Freq: Every day | ORAL | Status: DC
  Administered 2019-07-08 – 2019-07-20 (×13): 0.4 mg via ORAL
  Filled 2019-07-07 (×13): qty 1

## 2019-07-07 MED ORDER — DEXAMETHASONE 4 MG PO TABS
4.0000 mg | ORAL_TABLET | Freq: Three times a day (TID) | ORAL | Status: AC
  Administered 2019-07-07 – 2019-07-09 (×7): 4 mg via ORAL
  Filled 2019-07-07 (×7): qty 1

## 2019-07-07 MED ORDER — GUAIFENESIN-DM 100-10 MG/5ML PO SYRP: 5.0000 mL | ORAL_SOLUTION | Freq: Four times a day (QID) | ORAL | Status: DC | PRN

## 2019-07-07 MED ORDER — PHENOL 1.4 % MT LIQD: 1.0000 | OROMUCOSAL | Status: DC | PRN

## 2019-07-07 MED ORDER — OXYCODONE HCL 5 MG PO TABS
5.0000 mg | ORAL_TABLET | ORAL | Status: DC | PRN
  Administered 2019-07-09 – 2019-07-16 (×6): 10 mg via ORAL
  Filled 2019-07-07 (×6): qty 2

## 2019-07-07 MED ORDER — INSULIN ASPART 100 UNIT/ML ~~LOC~~ SOLN
0.0000 [IU] | Freq: Three times a day (TID) | SUBCUTANEOUS | Status: DC
  Administered 2019-07-07 – 2019-07-08 (×3): 3 [IU] via SUBCUTANEOUS
  Administered 2019-07-08: 2 [IU] via SUBCUTANEOUS
  Administered 2019-07-09: 8 [IU] via SUBCUTANEOUS
  Administered 2019-07-09 – 2019-07-10 (×3): 3 [IU] via SUBCUTANEOUS
  Administered 2019-07-10: 5 [IU] via SUBCUTANEOUS
  Administered 2019-07-10 – 2019-07-11 (×2): 2 [IU] via SUBCUTANEOUS
  Administered 2019-07-11: 8 [IU] via SUBCUTANEOUS
  Administered 2019-07-11: 3 [IU] via SUBCUTANEOUS
  Administered 2019-07-12: 5 [IU] via SUBCUTANEOUS
  Administered 2019-07-12: 3 [IU] via SUBCUTANEOUS
  Administered 2019-07-13 (×2): 8 [IU] via SUBCUTANEOUS
  Administered 2019-07-14: 5 [IU] via SUBCUTANEOUS
  Administered 2019-07-14 – 2019-07-15 (×4): 3 [IU] via SUBCUTANEOUS
  Administered 2019-07-15: 8 [IU] via SUBCUTANEOUS
  Administered 2019-07-16 (×2): 3 [IU] via SUBCUTANEOUS
  Administered 2019-07-16: 2 [IU] via SUBCUTANEOUS
  Administered 2019-07-17 (×2): 5 [IU] via SUBCUTANEOUS
  Administered 2019-07-18 (×2): 2 [IU] via SUBCUTANEOUS
  Administered 2019-07-18: 3 [IU] via SUBCUTANEOUS
  Administered 2019-07-19: 5 [IU] via SUBCUTANEOUS
  Administered 2019-07-19 – 2019-07-20 (×3): 3 [IU] via SUBCUTANEOUS
  Administered 2019-07-20: 2 [IU] via SUBCUTANEOUS

## 2019-07-07 MED ORDER — GABAPENTIN 300 MG PO CAPS
300.0000 mg | ORAL_CAPSULE | Freq: Three times a day (TID) | ORAL | Status: DC
  Administered 2019-07-07 – 2019-07-20 (×38): 300 mg via ORAL
  Filled 2019-07-07 (×39): qty 1

## 2019-07-07 MED ORDER — PROCHLORPERAZINE 25 MG RE SUPP: 12.5000 mg | Freq: Four times a day (QID) | RECTAL | Status: DC | PRN

## 2019-07-07 MED ORDER — BISACODYL 10 MG RE SUPP
10.0000 mg | Freq: Every day | RECTAL | Status: DC
  Administered 2019-07-08 – 2019-07-19 (×12): 10 mg via RECTAL
  Filled 2019-07-07 (×12): qty 1

## 2019-07-07 MED ORDER — TRAZODONE HCL 50 MG PO TABS: 25.0000 mg | ORAL_TABLET | Freq: Every evening | ORAL | Status: DC | PRN

## 2019-07-07 MED ORDER — HYDRALAZINE HCL 10 MG PO TABS
10.0000 mg | ORAL_TABLET | Freq: Two times a day (BID) | ORAL | Status: DC
  Administered 2019-07-07 – 2019-07-20 (×23): 10 mg via ORAL
  Filled 2019-07-07 (×26): qty 1

## 2019-07-07 MED ORDER — ALUM & MAG HYDROXIDE-SIMETH 200-200-20 MG/5ML PO SUSP
30.0000 mL | ORAL | Status: DC | PRN
  Administered 2019-07-11 – 2019-07-15 (×5): 30 mL via ORAL
  Filled 2019-07-07 (×5): qty 30

## 2019-07-07 MED ORDER — TRAMADOL HCL 50 MG PO TABS
50.0000 mg | ORAL_TABLET | Freq: Four times a day (QID) | ORAL | Status: DC | PRN
  Administered 2019-07-07 – 2019-07-19 (×7): 50 mg via ORAL
  Filled 2019-07-07 (×8): qty 1

## 2019-07-07 MED ORDER — DEXAMETHASONE 4 MG PO TABS: 4.0000 mg | ORAL_TABLET | Freq: Three times a day (TID) | ORAL | Status: DC

## 2019-07-07 MED ORDER — POLYETHYLENE GLYCOL 3350 17 G PO PACK: 17.0000 g | PACK | Freq: Every day | ORAL | Status: DC | PRN

## 2019-07-07 MED ORDER — LOSARTAN POTASSIUM 50 MG PO TABS
100.0000 mg | ORAL_TABLET | Freq: Every day | ORAL | Status: DC
  Administered 2019-07-08 – 2019-07-20 (×12): 100 mg via ORAL
  Filled 2019-07-07 (×13): qty 2

## 2019-07-07 NOTE — H&P (Signed)
Physical Medicine and Rehabilitation Admission H&P    Chief Complaint  Patient presents with  . Thoracic myelopathy with functional decline.     HPI: Timothy Davenport is a 69 year old male with history of T2DM with neuropathy, recent TIA who started developing RLE weakness/numbness about two months ago with recurrent falls and was admitted on 06/30/19 after a fall with inability to move LLE and worsening of RLE weakness.  MRI brain was negative for acute changes but showed right parotid gland neoplasms.  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  He was evaluated by Dr. Kathyrn Sheriff and taken to the OR on 05/12 for T6 and T7 laminectomy and right T6 and T7 pediculectomy for microdiscectomy at T6/T7.    Postop had issues with hypotension and dizziness with presyncope as well as urinary retention.  He was started on IV Decadron as follow-up MRI showed large hematoma in laminectomy bed and paraspinal soft tissues resulting in severe cord compression in conjunction to residual small disc protrusion and cord edema from T5 to mid T7.  Due to neurological decline he was taken back to the OR on 05/14 for evacuation of hematoma and on decadron taper.   He continues to have paraplegia but reports some increase in sensation. Therapy ongoing and CIR recommended due to functional decline in ability to complete ADLs and in mobility.    Patient reports he has no movement of his LEs, but has intact movement of his arms- LBM 2 days ago- was liquidy- with enema- and  Was small.  Has a foley catheter since unable to void.  Sleeping well- esp with PRAFOs which keeps his legs in a good position.     Review of Systems  Constitutional: Negative for chills and fever.  HENT: Negative for hearing loss and tinnitus.   Eyes: Negative for blurred vision and double vision.  Respiratory:  Negative for cough.   Cardiovascular: Negative for chest pain and palpitations.  Gastrointestinal: Positive for constipation. Negative for abdominal pain, heartburn, nausea and vomiting (none today).  Genitourinary:       Used to get up 2-3 times a night to urinate  Musculoskeletal: Positive for back pain and myalgias.  Skin: Negative for rash.  Neurological: Positive for sensory change, focal weakness and weakness. Negative for dizziness and headaches.  Psychiatric/Behavioral: The patient does not have insomnia.   All other systems reviewed and are negative.    Past Medical History:  Diagnosis Date  . Colon polyp   . Diabetes mellitus without complication ([place])   . Diabetic neuropathy ([place])   . HTN (hypertension)   . Patella fracture   . Renal calculi     Past Surgical History:  Procedure Laterality Date  . LUMBAR LAMINECTOMY/DECOMPRESSION MICRODISCECTOMY N/A 07/01/2019   Procedure: LUMBAR LAMINECTOMY/DECOMPRESSION MICRODISCECTOMY 1 LEVEL, THORACIC SIX-SEVEN;  Surgeon: Consuella Lose, MD;  Location: [place];  Service: Neurosurgery;  Laterality: N/A;  LUMBAR LAMINECTOMY/DECOMPRESSION MICRODISCECTOMY 1 LEVEL, THORACIC SIX-SEVEN   . ORIF PATELLA    . THORACIC DISCECTOMY N/A 07/03/2019   Procedure: Evacuation of Thoracic Hematoma;  Surgeon: Consuella Lose, MD;  Location: [place];  Service: Neurosurgery;  Laterality: N/A;    Family History  Problem Relation Age of Onset  . Hypertension Mother   . Hypertension Father     Social History:  Married. 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 He started using a cane a couple of months ago and was using a wheelchair for two weeks PTA.    reports that he has never smoked. He has never used smokeless tobacco. He reports current alcohol use--2-3 shots of liquor or 2-3 beers a couple times   Allergies: No Known Allergies    Medications Prior to Admission  Medication Sig Dispense Refill  . amLODipine  (NORVASC) 10 MG tablet Take 10 mg by mouth daily.    [name] atorvastatin (LIPITOR) 80 MG tablet Take 80 mg by mouth at bedtime.    [name] glipiZIDE (GLUCOTROL) 5 MG tablet Take 5 mg by mouth daily before breakfast.     . hydrALAZINE (APRESOLINE) 25 MG tablet Take 25 mg by mouth 2 (two) times daily.    [name] losartan (COZAAR) 100 MG tablet Take 100 mg by mouth daily.    . methylPREDNISolone (MEDROL) 4 MG TBPK tablet Take according to package insert 1 each 0  . omeprazole (PRILOSEC) 20 MG capsule Take 20 mg by mouth daily before breakfast.    . pioglitazone (ACTOS) 30 MG tablet Take 30 mg by mouth daily.    . sildenafil (VIAGRA) 100 MG tablet Take 50 mg by mouth daily as needed (as directed- NOT TO EXCEED 50 MG/24 HOURS).       Drug Regimen Review  Drug regimen was reviewed and remains appropriate with no significant issues identified  Home: Home Living Family/patient expects to be discharged to:: Private residence Living Arrangements: Spouse/significant other   Functional History:    Functional Status:  Mobility:          ADL:    Cognition:       Blood pressure 104/76, pulse 76, temperature 98.6 F (37 C), resp. rate 18, SpO2 100 %. Physical Exam  Nursing note and vitals reviewed. Constitutional: He is oriented to person, place, and time. He appears well-developed and well-nourished.  Older R handed male sitting up in bedside chair- wife at bedside- appears younger than stated age, NAD Of note, did not do pressure relief while I was in room.   HENT:  Head: Normocephalic and atraumatic.  Nose: Nose normal.  Mouth/Throat: Oropharynx is clear and moist. No oropharyngeal exudate.  No facial asymmetry No facial sensation changes   Eyes: Conjunctivae are normal. Right eye exhibits no discharge. Left eye exhibits no discharge.  EOMI B/L No nystagmus R eye additional skin tag- medial lower eye  Neck: No tracheal deviation present.  Cardiovascular:  RRR- no JVD  Respiratory: No  stridor.  CTA B/L- good air movement B/L  GI: He exhibits no distension.  A little distended; soft still; NT; (+) BS- normoactive  Musculoskeletal:        General: No edema.     Cervical back: Normal range of motion and neck supple.     Comments: UEs deltoids, biceps, triceps, WE, grip and finger abd 5/5 B/L  0/5 in HF, KE, KF, DF and PF B/L  Neurological: He is alert and oriented to person, place, and time.  Mildly decreased sdensation reduction from  L1 to S5 B/L- however pt said it's "mild"- can feel everything from "head to bottom" No hoffman's B/L   Skin:  IV in R antecubital fossa- no infiltration No skin breakdown on heels - backside- nurse agrees   Psychiatric: He has a normal mood and affect. His behavior is normal.    Results for orders placed or performed during the hospital encounter of 06/30/19 (from  the past 48 hour(s))  Glucose, capillary     Status: Abnormal   Collection Time: 07/05/19  3:56 PM  Result Value Ref Range   Glucose-Capillary 207 (H) 70 - 99 mg/dL    Comment: Glucose reference range applies only to samples taken after fasting for at least 8 hours.  Glucose, capillary     Status: Abnormal   Collection Time: 07/05/19  9:48 PM  Result Value Ref Range   Glucose-Capillary 251 (H) 70 - 99 mg/dL    Comment: Glucose reference range applies only to samples taken after fasting for at least 8 hours.  Glucose, capillary     Status: Abnormal   Collection Time: 07/06/19  7:56 AM  Result Value Ref Range   Glucose-Capillary 234 (H) 70 - 99 mg/dL    Comment: Glucose reference range applies only to samples taken after fasting for at least 8 hours.  Glucose, capillary     Status: Abnormal   Collection Time: 07/06/19 11:21 AM  Result Value Ref Range   Glucose-Capillary 174 (H) 70 - 99 mg/dL    Comment: Glucose reference range applies only to samples taken after fasting for at least 8 hours.  Glucose, capillary     Status: Abnormal   Collection Time: 07/06/19  4:10  PM  Result Value Ref Range   Glucose-Capillary 169 (H) 70 - 99 mg/dL    Comment: Glucose reference range applies only to samples taken after fasting for at least 8 hours.  Glucose, capillary     Status: Abnormal   Collection Time: 07/06/19  9:34 PM  Result Value Ref Range   Glucose-Capillary 177 (H) 70 - 99 mg/dL    Comment: Glucose reference range applies only to samples taken after fasting for at least 8 hours.  Glucose, capillary     Status: Abnormal   Collection Time: 07/07/19  7:41 AM  Result Value Ref Range   Glucose-Capillary 222 (H) 70 - 99 mg/dL    Comment: Glucose reference range applies only to samples taken after fasting for at least 8 hours.  Glucose, capillary     Status: Abnormal   Collection Time: 07/07/19 11:37 AM  Result Value Ref Range   Glucose-Capillary 245 (H) 70 - 99 mg/dL    Comment: Glucose reference range applies only to samples taken after fasting for at least 8 hours.   VAS [place] LOWER EXTREMITY VENOUS (DVT)  Result Date: 07/07/2019  Lower Venous DVTStudy Indications: Edema, and BLE PLEGIA.  Comparison Study: none Performing Technologist: June Leap RDMS, RVT  Examination Guidelines: A complete evaluation includes B-mode imaging, spectral Doppler, color Doppler, and power Doppler as needed of all accessible portions of each vessel. Bilateral testing is considered an integral part of a complete examination. Limited examinations for reoccurring indications may be performed as noted. The reflux portion of the exam is performed with the patient in reverse Trendelenburg.  +---------+---------------+---------+-----------+----------+--------------+ RIGHT    CompressibilityPhasicitySpontaneityPropertiesThrombus Aging +---------+---------------+---------+-----------+----------+--------------+ CFV      Full           Yes      Yes                                 +---------+---------------+---------+-----------+----------+--------------+ SFJ      Full                                                         +---------+---------------+---------+-----------+----------+--------------+  FV Prox  Full                                                        +---------+---------------+---------+-----------+----------+--------------+ FV Mid   Full                                                        +---------+---------------+---------+-----------+----------+--------------+ FV DistalFull                                                        +---------+---------------+---------+-----------+----------+--------------+ PFV      Full                                                        +---------+---------------+---------+-----------+----------+--------------+ POP      Full           Yes      Yes                                 +---------+---------------+---------+-----------+----------+--------------+ PTV      Full                                                        +---------+---------------+---------+-----------+----------+--------------+ PERO     Full                                                        +---------+---------------+---------+-----------+----------+--------------+   +---------+---------------+---------+-----------+----------+--------------+ LEFT     CompressibilityPhasicitySpontaneityPropertiesThrombus Aging +---------+---------------+---------+-----------+----------+--------------+ CFV      Full           Yes      Yes                                 +---------+---------------+---------+-----------+----------+--------------+ SFJ      Full                                                        +---------+---------------+---------+-----------+----------+--------------+ FV Prox  Full                                                        +---------+---------------+---------+-----------+----------+--------------+  FV Mid   Full                                                         +---------+---------------+---------+-----------+----------+--------------+ FV DistalFull                                                        +---------+---------------+---------+-----------+----------+--------------+ PFV      Full                                                        +---------+---------------+---------+-----------+----------+--------------+ POP      Full           Yes      Yes                                 +---------+---------------+---------+-----------+----------+--------------+ PTV      Full                                                        +---------+---------------+---------+-----------+----------+--------------+ PERO     Full                                                        +---------+---------------+---------+-----------+----------+--------------+     Summary: BILATERAL: - No evidence of deep vein thrombosis seen in the lower extremities, bilaterally. -No evidence of popliteal cyst, bilaterally.   *See table(s) above for measurements and observations. Electronically signed by Deitra Mayo MD on 07/07/2019 at 12:55:29 PM.    Final        Medical Problem List and Plan: 1.  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   -patient may  shower  -ELOS/Goals: 3-3.5 weeks- goals Mod I to min assist 2.  Antithrombotics: -DVT/anticoagulation:  Mechanical: Sequential compression devices, below knee Bilateral lower extremities Dopplers today were negative.   -need to determine when OK to start Lovenox- due to high risk of DVT.  -antiplatelet therapy: N/A--has been off Plavix.  3. Pain Management: Continue gabapentin tid with oxycodone prn.  4. Mood: LCSW to follow for evaluation and support.   -antipsychotic agents: N/a 5. Neuropsych: This patient is capable of making decisions on his own behalf. 6. Skin/Wound Care: Routine pressure  relief measures.  Discussed with pt in depth how necessary pressure relief every 15-20 minutes is essential to prevent pressure ulcers and morbidity/complications. 7. Fluids/Electrolytes/Nutrition: Monitor I/O. Check lytes in am.  8. Cord compression:On decadron 4 mg IV every 6 hours-->will start taper 9. HTN: Monitor BP tid--continue Norvasc, Cozaar and  hydralazine.   10. T2DM: Monitor BS ac/hs. On Glucotrol daily with SSI for elevated BS- likely made worse by Decadron.  11. Neurogenic bladder: Continue Flomax. Discussed at length Foley vs in/out caths- has foley- asked pt and wife to discuss long term what would work best for them.   12. Hematochezia with Neurogenic bowel: Last BM 5/15 with bloody streaks. Will order KUB to evaluate stool burden. Continue Senna S. Will administer Miralax and enema today. Then start on bowel program nightly after dinner.  13. CKD Stage IIIa: Will order post op labs in am to monitor lytes/renal status. 14. Leucocytosis: Question/likely due to steroids. Monitor for signs of infection. Will check labs in am.  15. ABLA: Last H/H checked prior to hematoma evacuation on 05/13-->recheck post op CBC in am.  16. Hypokalemia: Last K+-3.1 --will recheck in am.     Bary Leriche- PA-C 07/07/2019   I have personally performed a face to face diagnostic evaluation of this patient and formulated the key components of the plan.  Additionally, I have personally reviewed laboratory data, imaging studies, as well as relevant notes and concur with the physician assistant's documentation above.   The patient's status has not changed from the original H&P.  Any changes in documentation from the acute care chart have been noted above.   I spent a total 1.5 hours total on admission due to discussing neurogenic bowel, bladder, and risk of pressure ulcers/how to reduce risk   Courtney Heys, MD 07/07/2019

## 2019-07-07 NOTE — Progress Notes (Signed)
Physical Therapy Treatment Patient Details Name: Timothy Davenport MRN: [id number] DOB: [birth year] Today's Date: 07/07/2019    History of Present Illness Pt is a 69 y.o. male with history of HTN, HDL, DM (last A1C <7%), ?recent TIA on plavix daily who presented to ED after a fall and inability to move BLE. 1 month ago pt developed significant RLE weakness, seeing PT without improvement in strength. Pt found to have large disc extrusion at T6-7 which results in severe spinal cord compression and associated T2 signal changes. Pt is now s/p T6, T7 laminectomy, right T6 and T7 pediculotomy for microdiscectomy at T6-7 on 5/12. Pt with worsening edema around the spinal cord and development of a hematoma post-operatively which was evacuated on 5/14.    PT Comments    Pt with improved sitting tolerance and ability to achieve and maintain mid line position and EOB balance. Pt with inability to retract scapula and stick chest out due to weakness in core and inability to achieve anterior pelvic tilts. Focused on dynamic sitting balance and lateral scoot transfers in conjunction with OT today. Con't to recommend CIR upon d/c.  Acute PT to cont to follow.    Follow Up Recommendations  CIR;Supervision/Assistance - 24 hour     Equipment Recommendations  Wheelchair (measurements PT);Wheelchair cushion (measurements PT);Hospital bed;Other (comment)    Recommendations for Other Services       Precautions / Restrictions Precautions Precautions: Fall;Back Precaution Booklet Issued: No Precaution Comments: back precautions Required Braces or Orthoses: (no brace needed per orders) Restrictions Weight Bearing Restrictions: No    Mobility  Bed Mobility Overal bed mobility: Needs Assistance Bed Mobility: Rolling;Supine to Sit Rolling: Mod assist(maxA for LE management) Sidelying to sit: Mod assist Supine to sit: Mod assist;HOB elevated     General bed mobility comments: pt requiring maxA for LE  management, verbal cues to adhere to back precautions, strong reliance on bed rail, HOB elevated  Transfers Overall transfer level: Needs assistance Equipment used: None Transfers: Lateral/Scoot Transfers          Lateral/Scoot Transfers: Mod assist General transfer comment: pt able to verbalize positioning needs and then initiate transfer from EOB to chair. pt with one therapist holding chair for stability and pt able to lateral scoot with pad guide from PT   Ambulation/Gait             General Gait Details: unable, pt paraplegic   Stairs             Wheelchair Mobility    Modified Rankin (Stroke Patients Only)       Balance Overall balance assessment: Needs assistance Sitting-balance support: Bilateral upper extremity supported;Feet supported Sitting balance-Leahy Scale: Poor Sitting balance - Comments: pt witih LOB in all directions but attempting to self correct with awareness, worked on dynamic balance and moving UEs and trying to maintain EOB balance, pt requiring modA to prevent fall posteriorly, pt with weak abdominals to pull self back up to neutral                                    Cognition Arousal/Alertness: Awake/alert Behavior During Therapy: WFL for tasks assessed/performed Overall Cognitive Status: Within Functional Limits for tasks assessed                                 General Comments: pt motivated  to participate today by 3 year anniversary with wife      Exercises Other Exercises Other Exercises: core activation for static sitting Other Exercises: shoulder depression, spine elongation scapula retraction sustain and hold for >15 seconds Other Exercises: alternating hands from knee to shoulder one at a time to sustain static standing Other Exercises: sitting with hands on bed surface within back precautions but prop sitting    General Comments General comments (skin integrity, edema, etc.): VSS       Pertinent Vitals/Pain Pain Assessment: No/denies pain Faces Pain Scale: Hurts a little bit Pain Location: report mild back pain with prolonged sitting at incision site Pain Descriptors / Indicators: Discomfort;Grimacing    Home Living                      Prior Function            PT Goals (current goals can now be found in the care plan section) Acute Rehab PT Goals Patient Stated Goal: to be able to walk  Progress towards PT goals: Progressing toward goals    Frequency    Min 5X/week      PT Plan Current plan remains appropriate    Co-evaluation PT/OT/SLP Co-Evaluation/Treatment: Yes Reason for Co-Treatment: For patient/therapist safety PT goals addressed during session: Mobility/safety with mobility OT goals addressed during session: ADL's and self-care;Strengthening/ROM      AM-PAC PT "6 Clicks" Mobility   Outcome Measure  Help needed turning from your back to your side while in a flat bed without using bedrails?: A Lot Help needed moving from lying on your back to sitting on the side of a flat bed without using bedrails?: Total Help needed moving to and from a bed to a chair (including a wheelchair)?: A Lot Help needed standing up from a chair using your arms (e.g., wheelchair or bedside chair)?: Total Help needed to walk in hospital room?: Total Help needed climbing 3-5 steps with a railing? : Total 6 Click Score: 8    End of Session Equipment Utilized During Treatment: Gait belt Activity Tolerance: Patient tolerated treatment well Patient left: in chair;with call bell/phone within reach;with chair alarm set Nurse Communication: Mobility status(how to laterally scoot pt over) PT Visit Diagnosis: Other abnormalities of gait and mobility (R26.89);Muscle weakness (generalized) (M62.81);History of falling (Z91.81);Other symptoms and signs involving the nervous system (R29.898)     Time: [id number] PT Time Calculation (min) (ACUTE ONLY): 33  min  Charges:  $Neuromuscular Re-education: 8-22 mins                     Kittie Plater, PT, DPT Acute Rehabilitation Services Pager #: [phone] Office #: [phone]    Berline Lopes 07/07/2019, 1:14 PM

## 2019-07-07 NOTE — Plan of Care (Signed)
  Problem: RH BOWEL ELIMINATION Goal: RH STG MANAGE BOWEL WITH ASSISTANCE Description: STG Manage Bowel with min assist  Outcome: Progressing Goal: RH STG MANAGE BOWEL W/MEDICATION W/ASSISTANCE Description: STG Manage Bowel with Medication with min assist Outcome: Progressing   Problem: RH BLADDER ELIMINATION Goal: RH STG MANAGE BLADDER WITH ASSISTANCE Description: STG Manage Bladder independently  Outcome: Progressing   Problem: RH SAFETY Goal: RH STG ADHERE TO SAFETY PRECAUTIONS W/ASSISTANCE/DEVICE Description: STG Adhere to Safety Precautions With min assist Outcome: Progressing Goal: RH STG DECREASED RISK OF FALL WITH ASSISTANCE Description: STG Decreased Risk of Fall With Assistance. Outcome: Progressing

## 2019-07-07 NOTE — Progress Notes (Signed)
Occupational Therapy Treatment Patient Details Name: Timothy Davenport MRN: [id number] DOB: 07-16-1950 Today's Date: 07/07/2019    History of present illness Pt is a 69 y.o. male with history of HTN, HDL, DM (last A1C <7%), ?recent TIA on plavix daily who presented to ED after a fall and inability to move BLE. 1 month ago pt developed significant RLE weakness, seeing PT without improvement in strength. Pt found to have large disc extrusion at T6-7 which results in severe spinal cord compression and associated T2 signal changes. Pt is now s/p T6, T7 laminectomy, right T6 and T7 pediculotomy for microdiscectomy at T6-7 on 5/12. Pt with worsening edema around the spinal cord and development of a hematoma post-operatively which was evacuated on 5/14.   OT comments  Pt worked at EOB 20 minutes on stability to static sit and preform grooming task. Pt lateral transfer to chair at this time. Pt very motivated and eager to engage in all task asked of patient. COntinue to recommend CIR for d/c planning.   Follow Up Recommendations  CIR;Supervision/Assistance - 24 hour    Equipment Recommendations  None recommended by OT    Recommendations for Other Services Rehab consult    Precautions / Restrictions Precautions Precautions: Fall;Back Precaution Comments: back precautions Restrictions Weight Bearing Restrictions: No       Mobility Bed Mobility Overal bed mobility: Needs Assistance Bed Mobility: Rolling;Supine to Sit Rolling: Min assist   Supine to sit: Mod assist;HOB elevated     General bed mobility comments: pt able to sequence task but requires (A) for BIL LE to bend knees and keep alignment. pt able to push up with R elbow to sitting  Transfers Overall transfer level: Needs assistance   Transfers: Lateral/Scoot Transfers          Lateral/Scoot Transfers: Mod assist General transfer comment: pt able to verbalize positioning needs and then initiate transfer from EOB to chair. pt  with one therapist holding chair for stability and pt able to lateral scoot with pad guide from PT     Balance Overall balance assessment: Needs assistance Sitting-balance support: Bilateral upper extremity supported;Feet supported Sitting balance-Leahy Scale: Poor Sitting balance - Comments: pt witih lob in all directions but attempting to self correct with awareness                                   ADL either performed or assessed with clinical judgement   ADL Overall ADL's : Needs assistance/impaired     Grooming: Wash/dry face;Moderate assistance;Sitting Grooming Details (indicate cue type and reason): pt able to reach up to wash face but unable to sustaing without extended (A)                               General ADL Comments: pt worked at [employer] on static sitting and core activation     Technical brewer Arousal/Alertness: Awake/alert Behavior During Therapy: WFL for tasks assessed/performed Overall Cognitive Status: Within Functional Limits for tasks assessed                                 General Comments: pt motivated to participate today by 30 year anniversary with wife  Exercises Other Exercises Other Exercises: core activation for static sitting Other Exercises: shoulder depression, spine elongation scapula retraction sustain and hold for >15 seconds Other Exercises: alternating hands from knee to shoulder one at a time to sustain static standing Other Exercises: sitting with hands on bed surface within back precautions but prop sitting   Shoulder Instructions       General Comments VSS    Pertinent Vitals/ Pain       Pain Assessment: Faces Faces Pain Scale: Hurts a little bit Pain Location: with prolonged sitting states "i can start to feel my back some now" Pain Descriptors / Indicators: Discomfort;Grimacing  Home Living                                           Prior Functioning/Environment              Frequency  Min 2X/week        Progress Toward Goals  OT Goals(current goals can now be found in the care plan section)  Progress towards OT goals: Progressing toward goals  Acute Rehab OT Goals Patient Stated Goal: to be able to walk  OT Goal Formulation: With patient Time For Goal Achievement: 07/19/19 Potential to Achieve Goals: Good  Plan Discharge plan remains appropriate    Co-evaluation    PT/OT/SLP Co-Evaluation/Treatment: Yes Reason for Co-Treatment: For patient/therapist safety;To address functional/ADL transfers   OT goals addressed during session: ADL's and self-care;Strengthening/ROM      AM-PAC OT "6 Clicks" Daily Activity     Outcome Measure   Help from another person eating meals?: None Help from another person taking care of personal grooming?: A Lot Help from another person toileting, which includes using toliet, bedpan, or urinal?: Total Help from another person bathing (including washing, rinsing, drying)?: A Lot Help from another person to put on and taking off regular upper body clothing?: A Lot Help from another person to put on and taking off regular lower body clothing?: Total 6 Click Score: 12    End of Session    OT Visit Diagnosis: Muscle weakness (generalized) (M62.81)   Activity Tolerance Patient tolerated treatment well   Patient Left in chair;with call bell/phone within reach;with chair alarm set   Nurse Communication Mobility status;Precautions        Time: [id number] OT Time Calculation (min): 32 min  Charges: OT General Charges $OT Visit: 1 Visit OT Treatments $Therapeutic Exercise: 8-22 mins   Brynn, OTR/L  Acute Rehabilitation Services Pager: [phone] Office: [phone] .    Jeri Modena 07/07/2019, 12:25 PM

## 2019-07-07 NOTE — Discharge Summary (Signed)
Physician Discharge Summary  Patient ID: Timothy Davenport MRN: [id number] DOB/AGE: [birth year] 71 y.o.  Admit date: 06/30/2019 Discharge date: 07/07/2019  Admission Diagnoses:  Thoracic myelopathy Paraplegia  Discharge Diagnoses:  Same Active Problems:   Thoracic myelopathy  Discharged Condition: Stable  Hospital Course:  Timothy Davenport is a 69 y.o. male who presented to ED 5/11 for acute lower extremity paresis after a fall. He was found to have a large disc herniation at T6-7. He underwent surgical decompression 5/12. Post op complicated by BLE plegia requiring repeat surgery for hematoma evacuation. He otherwise had no complications. He worked with PT/OT and was rec for [employer]. He was discharged 5/18 to CIR for rehab. At time of discharge, pain was well controlled tolerating po, voiding normal. Ready for discharge.  Treatments: Surgery 5/12: T6, T7 laminectomy, right T6 and T7 pediculotomy for microdiscectomy at T6-7 5/14: Evacuation of postoperative hematoma  Discharge Exam: Blood pressure 122/79, pulse 82, temperature 97.9 F (36.6 C), temperature source Axillary, resp. rate 15, height 5\' 10"  (1.778 m), weight 79.8 kg, SpO2 100 %. Awake, alert, oriented Speech fluent, appropriate CN grossly intact 5/5 BUE, 0/5 BLE Wound c/d/i  Disposition: Discharge disposition: [employer] Not Defined       Discharge Instructions    Call MD for:  difficulty breathing, headache or visual disturbances   Complete by: As directed    Call MD for:  persistant dizziness or light-headedness   Complete by: As directed    Call MD for:  redness, tenderness, or signs of infection (pain, swelling, redness, odor or green/yellow discharge around incision site)   Complete by: As directed    Call MD for:  severe uncontrolled pain   Complete by: As directed    Call MD for:  temperature >100.4   Complete by: As directed    Increase activity slowly   Complete by: As directed    Lifting restrictions   Complete by: As directed    Do not lift anything >10lbs. Avoid bending and twisting in awkward positions. Avoid bending at the back.   Remove dressing in 24 hours   Complete by: As directed      Allergies as of 07/07/2019   No Known Allergies     Medication List    STOP taking these medications   clopidogrel 75 MG tablet Commonly known as: PLAVIX     TAKE these medications   amLODipine 10 MG tablet Commonly known as: NORVASC Take 10 mg by mouth daily.   atorvastatin 80 MG tablet Commonly known as: LIPITOR Take 80 mg by mouth at bedtime.   glipiZIDE 5 MG tablet Commonly known as: GLUCOTROL Take 5 mg by mouth daily before breakfast.   hydrALAZINE 25 MG tablet Commonly known as: APRESOLINE Take 25 mg by mouth 2 (two) times daily.   losartan 100 MG tablet Commonly known as: COZAAR Take 100 mg by mouth daily.   methylPREDNISolone 4 MG Tbpk tablet Commonly known as: Medrol Take according to package insert   omeprazole 20 MG capsule Commonly known as: PRILOSEC Take 20 mg by mouth daily before breakfast.   pioglitazone 30 MG tablet Commonly known as: ACTOS Take 30 mg by mouth daily.   sildenafil 100 MG tablet Commonly known as: VIAGRA Take 50 mg by mouth daily as needed (as directed- NOT TO EXCEED 50 MG/24 HOURS).      Follow-up Information    Consuella Lose, MD. Schedule an appointment as soon as possible for a visit in  3 week(s).   Specialty: Neurosurgery Contact information: [address] [place] [address] [place] [address] [phone]           Signed: Traci Sermon 07/07/2019, 10:48 AM

## 2019-07-07 NOTE — Progress Notes (Signed)
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  Referring Physician:  Dr. Kathyrn Sheriff     HPI: Timothy Davenport is a 69 y.o. male with history of T2DM with neuropathy who started developing RLE weakness a month ago and recent TIA who was admitted on 06/30/19 after a fall with inability to move LLE and worsening of RLE. MRI brain negative for acute changes and incident right parotid glands neoplasms. 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 He was evaluated by Dr. Kathyrn Sheriff and taken to OR 5/12 for T6 & T7 laminectomy and right T6 & T7 pediculotomy for microdiskectomy at T6/T7.  Post op with hypotension and dizziness with presyncope as well as urinary retention. He was started on IV decadron as follow MRI showed large hematoma in laminectomy bed and paraspinal soft tissues resulting in severe cord compression in conjunction to residual small disc protursion and cord edema T5-mid T7. Due to neurological decline, he was taken back to OR on 5/14 for      Review of Systems  Constitutional: Negative for fever.  HENT: Negative for hearing loss.   Eyes: Negative for double vision.  Gastrointestinal: Negative for nausea.  Genitourinary: Negative for dysuria.  Musculoskeletal: Positive for back pain.  Skin: Negative for rash.  Neurological: Positive for sensory change, focal  weakness and weakness.  Psychiatric/Behavioral: Negative for depression.            Past Medical History:  Diagnosis Date  . Diabetes mellitus without complication ([place])    . Diabetic neuropathy [facility])             Past Surgical History:  Procedure Laterality Date  . LUMBAR LAMINECTOMY/DECOMPRESSION MICRODISCECTOMY N/A 07/01/2019    Procedure: LUMBAR LAMINECTOMY/DECOMPRESSION MICRODISCECTOMY 1 LEVEL, THORACIC SIX-SEVEN;  Surgeon: Consuella Lose, MD;  Location: [facility];  Service: Neurosurgery;  Laterality: N/A;  LUMBAR LAMINECTOMY/DECOMPRESSION MICRODISCECTOMY 1 LEVEL, THORACIC SIX-SEVEN            Family History  Problem Relation Age of Onset  . Hypertension Mother    . Hypertension Father        Social History:  has no history on file for tobacco, alcohol, and drug.      Allergies: No Known Allergies            Medications Prior to Admission  Medication Sig Dispense Refill  . amLODipine (NORVASC) 10 MG tablet Take 10 mg by mouth daily.      [name] atorvastatin (LIPITOR) 80 MG tablet Take 80 mg by mouth at bedtime.      . clopidogrel (PLAVIX) 75 MG tablet Take 75 mg by mouth daily. "REPLACES ASPIRIN FOR CLOT PREVENTION      . glipiZIDE (GLUCOTROL) 5 MG tablet Take 5 mg by mouth daily before breakfast.       . hydrALAZINE (APRESOLINE) 25  MG tablet Take 25 mg by mouth 2 (two) times daily.      [name] losartan (COZAAR) 100 MG tablet Take 100 mg by mouth daily.      [name] omeprazole (PRILOSEC) 20 MG capsule Take 20 mg by mouth daily before breakfast.      . pioglitazone (ACTOS) 30 MG tablet Take 30 mg by mouth daily.      . sildenafil (VIAGRA) 100 MG tablet Take 50 mg by mouth daily as needed (as directed- NOT TO EXCEED 50 MG/24 HOURS).           Home: Home Living Family/patient expects to be discharged to:: Inpatient rehab Living Arrangements: Spouse/significant other Available Help at Discharge: Family, Available 24 hours/day(spouse minA available, recent hip surgery) Type of Home:  House Home Access: Stairs to enter [employer] of Steps: 1 Entrance Stairs-Rails: None Home Layout: One level Home Equipment: Environmental consultant - 2 wheels, Cane - single point, Wheelchair - manual, Careers adviser History: Prior Function Level of Independence: Independent Comments: progressive weakness in RLE over last 2 months transitioning from cane to RW to wheelchair for mobility. Normally independent, gardens and fishes Functional Status:  Mobility: Bed Mobility Overal bed mobility: Needs Assistance Bed Mobility: Rolling, Sidelying to Sit Rolling: Mod assist Sidelying to sit: Max assist General bed mobility comments: LE management for rolling, LE management and trunk support for side to sit Transfers Overall transfer level: Needs assistance Equipment used: None Transfers: Lateral/Scoot Transfers  Lateral/Scoot Transfers: Mod assist General transfer comment: pt performs lateral scoot to drop arm recliner. PT educating pt on head-hips relationship, PT managing LE and assisting with power for scoots. Transferring from slightly elevated bed   ADL:   Cognition: Cognition Overall Cognitive Status: Within Functional Limits for tasks assessed Orientation Level: Oriented X4 Cognition Arousal/Alertness: Awake/alert Behavior During Therapy: WFL for tasks assessed/performed Overall Cognitive Status: Within Functional Limits for tasks assessed     Blood pressure (!) 151/83, pulse 76, temperature 98.4 F (36.9 C), temperature source Oral, resp. rate 18, height 5' 10.98" (1.803 m), weight 79.8 kg, SpO2 98 %. Physical Exam  Constitutional: He appears well-developed and well-nourished. No distress.  HENT:  Head: Normocephalic.  Cardiovascular: Normal rate.  Respiratory: Effort normal.  GI: Soft.  Musculoskeletal:     Cervical back: Normal range of motion.  Neurological: He is alert. No cranial nerve deficit.  UE 5/5 prox to distal. LE: tr-1/5 HE bilaterally. T12-L1  sensory level. DTR's 2+ LE's, sustained clonus right ankle. No resting tone. Sensed gross touch below waist. Decreased LT/pain  Skin: Skin is warm.  Back incision dressed  Psychiatric: He has a normal mood and affect. His behavior is normal.      Lab Results Last 24 Hours       Results for orders placed or performed during the hospital encounter of 06/30/19 (from the past 24 hour(s))  Glucose, capillary     Status: Abnormal    Collection Time: 07/02/19 11:23 AM  Result Value Ref Range    Glucose-Capillary 189 (H) 70 - 99 mg/dL  Glucose, capillary     Status: Abnormal    Collection Time: 07/02/19  5:38 PM  Result Value Ref Range    Glucose-Capillary 202 (H) 70 - 99 mg/dL  Glucose, capillary     Status: Abnormal    Collection Time: 07/02/19  9:53 PM  Result Value Ref Range    Glucose-Capillary 116 (H) 70 - 99 mg/dL  Glucose, capillary     Status: Abnormal  Collection Time: 07/03/19  7:42 AM  Result Value Ref Range    Glucose-Capillary 150 (H) 70 - 99 mg/dL       Imaging Results (Last 48 hours)  DG Thoracolumabar Spine   Result Date: 07/01/2019 CLINICAL DATA:  T6-T7 laminectomy EXAM: THORACOLUMBAR SPINE 1V COMPARISON:  None. FINDINGS: Two fluoroscopic images are obtained during performance of the procedure and are provided for interpretation only. Images demonstrate surgical instrumentation overlying the thoracic spine. Exact level cannot be ascertained given limited field of view. Please refer to operative report. FLUOROSCOPY TIME:  13.6 seconds IMPRESSION: 1. Intraoperative exam as above, please refer to operative report. Electronically Signed   By: Randa Ngo M.D.   On: 07/01/2019 19:23    MR THORACIC SPINE W WO CONTRAST   Result Date: 07/02/2019 CLINICAL DATA:  Patient status post T6-7 laminectomy and decompression 07/01/2019. Worsening motor function this morning with preserved sensation. EXAM: MRI THORACIC WITHOUT AND WITH CONTRAST TECHNIQUE: Multiplanar and multiecho  pulse sequences of the thoracic spine were obtained without and with intravenous contrast. CONTRAST:  7.5 mL GADAVIST IV SOLN COMPARISON:  MRI thoracic spine 06/20/2019. FINDINGS: MRI THORACIC SPINE FINDINGS Alignment:  Maintained. Vertebrae: No fracture, evidence of discitis or worrisome lesion. New focus of T1 and T2 hypointensity in the posterior margin of T6 to the right is presumably postoperative. Cord: Evaluation is somewhat limited by motion but there is a edema within the cord from approximately T5-6 to mid T7. Small focus of dilatation of the central medullary canal at T4-5 is unchanged. Paraspinal and other soft tissues: See below. Disc levels: T6-7: There is a heterogeneous collection without enhancement which measures approximately 5 cm transverse by 3 cm AP by 4 cm craniocaudal extending into the left laminectomy bed and along the posterior arc of the right sixth and seventh ribs. The collection causes severe cord compression. The hematoma also causes severe right foraminal narrowing at T6-7. There appears to be residual small disc protrusion eccentric to the left. The appearance of the thoracic spine is otherwise unchanged. IMPRESSION: Findings consistent with a large hematoma in the laminectomy bed and posterior paraspinous soft tissues to the right resulting in severe compression of the cord in conjunction with what appears to be a residual left paracentral protrusion. There is edema within the cord from approximately T5-6 to mid T7. No gross change in cord edema since the prior examination but there is a possibility of cord infarct due to compression. Critical Value/emergent results were called by telephone at the time of interpretation on 07/02/2019 at 1:56 pm to provider [facility] who verbally acknowledged these results. Electronically Signed   By: Inge Rise M.D.   On: 07/02/2019 13:59    [place] Intraoperative   Result Date: 07/01/2019 CLINICAL DATA:  Ultrasound was provided for  use by the ordering physician, and a technical charge was applied by the performing facility.  No radiologist interpretation/professional services rendered.    DG C-Arm 1-60 Min   Result Date: 07/01/2019 CLINICAL DATA:  T6-T7 laminectomy EXAM: THORACOLUMBAR SPINE 1V COMPARISON:  None. FINDINGS: Two fluoroscopic images are obtained during performance of the procedure and are provided for interpretation only. Images demonstrate surgical instrumentation overlying the thoracic spine. Exact level cannot be ascertained given limited field of view. Please refer to operative report. FLUOROSCOPY TIME:  13.6 seconds IMPRESSION: 1. Intraoperative exam as above, please refer to operative report. Electronically Signed   By: Randa Ngo M.D.   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 Pt with incomplete paraplegia and presents more like a T12/L1 injury from both a sensory and motor standpoint 1. Does the need for close, 24 hr/day medical supervision in concert with the patient's rehab needs make it unreasonable for this patient to be served in a less intensive setting? Yes 2. Co-Morbidities requiring supervision/potential complications: neurogenic bowel and bladder, pain, post-op hematoma 3. Due to bladder management, bowel management, safety, skin/wound care, disease management, medication administration, pain management and patient education, does the patient require 24 hr/day rehab nursing? Yes 4. Does the patient require coordinated care of a physician, rehab nurse, therapy disciplines of PT, OT to address physical and functional deficits in the context of the above medical diagnosis(es)? Yes Addressing deficits in the following areas: balance, endurance, locomotion, strength, transferring, bowel/bladder control, bathing, dressing, feeding, grooming, toileting and psychosocial  support 5. Can the patient actively participate in an intensive therapy program of at least 3 hrs of therapy per day at least 5 days per week? Yes 6. The potential for patient to make measurable gains while on inpatient rehab is excellent 7. Anticipated functional outcomes upon discharge from inpatient rehab are supervision and min assist  with PT, supervision and min assist with OT, n/a with SLP. 8. Estimated rehab length of stay to reach the above functional goals is: 20-25 days 9. Anticipated discharge destination: Home 10. Overall Rehab/Functional Prognosis: excellent   RECOMMENDATIONS: This patient's condition is appropriate for continued rehabilitative care in the following setting: CIR Patient has agreed to participate in recommended program. Yes Note that insurance prior authorization may be required for reimbursement for recommended care.   Recommend bilateral PRAFO's which I have ordered to stretch bilateral heel cords which are already tight.  Also would rec screening venous doppler studies LE   Comment: Rehab Admissions Coordinator to follow up.   Thanks,   Meredith Staggers, MD, Mellody Drown   I have personally performed a face to face diagnostic evaluation of this patient. Additionally, I have examined pertinent labs and radiographic images. I have reviewed and concur with the physician assistant's documentation above.     Bary Leriche, PA-C 07/03/2019        Revision History                     Routing History

## 2019-07-07 NOTE — Progress Notes (Signed)
Cristina Gong, RN  Rehab Admission Coordinator  Physical Medicine and Rehabilitation  PMR Pre-admission     Signed  Date of Service:  07/06/2019  1:50 PM      Related encounter: ED to Hosp-Admission (Discharged) from 06/30/2019 in [facility]        Show:Clear all [x] Manual[x] Template[x] Copied  Added by: [x] Cristina Gong, RN  [] Hover for details PMR Admission Coordinator Pre-Admission Assessment   Patient: Timothy Davenport is an 69 y.o., male MRN: [id number] DOB: [birth year] Height: 5\' 10"  (177.8 cm) Weight: 79.8 kg                                                                                                                                                  Insurance Information   Primary: [place] policy : [id number] Notified [facility] via portal [email or web address]    Discussed with [facility]   Secondary: Medicare part A only      Policy#: [id number]      Subscriber: pt Benefits:  Phone #: passport one online     Name: 5/17 Eff. Date: 04/19/2013     Deduct: $1484      Out of Pocket Max: none      Life Max: none  CIR: 100%      SNF: 20 full Outpatient: 80%     Co-Pay: 20% Home Health: 100%      Co-Pay: none DME: 80%     Co-Pay: 205 Providers: pt choice  Third: [place]      Policy#: [id number]     The "Data Collection Information Summary" for patients in Inpatient Rehabilitation Facilities with attached "Privacy Act [place] Records" was provided and verbally reviewed with: Patient and Family   Emergency Contact Information         Contact Information     Name Relation Home Work Mobile    Hege,Hazel Spouse [phone]   [phone]       Current Medical History  Patient Admitting Diagnosis: incomplete paraplegia   History of Present Illness:  69 y.o. male with history of T2DM with neuropathy who started developing RLE  weakness a month ago and recent TIA who was admitted on 06/30/19 after a fall with inability to move LLE and worsening of RLE. MRI brain negative for acute changes and incident right parotid glands neoplasms. 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 He was evaluated by Dr. Kathyrn Sheriff and taken to OR 5/12 for T6 & T7 laminectomy and right T6 & T7 pediculotomy for microdiskectomy at T6/T7.  Post op with hypotension and dizziness with presyncope as well as urinary retention. He was started on IV decadron as follow  MRI showed large hematoma in laminectomy bed and paraspinal soft tissues resulting in severe cord compression in conjunction to residual small disc protrusion and cord edema T5-mid T7. Due to neurological decline, he was taken back to OR on 5/14 for evacuation.     Past Medical History      Past Medical History:  Diagnosis Date  . Diabetes mellitus without complication ([place])    . Diabetic neuropathy ([place])        Family History  family history includes Hypertension in his father and mother.   Prior Rehab/Hospitalizations:  Has the patient had prior rehab or hospitalizations prior to admission? Yes   Has the patient had major surgery during 100 days prior to admission? Yes   Current Medications    Current Facility-Administered Medications:  .  0.9 %  sodium chloride infusion (Manually program via Guardrails IV Fluids), , Intravenous, Once, Consuella Lose, MD .  0.9 %  sodium chloride infusion, , Intravenous, Continuous, Costella, Vista Mink, PA-C, Stopped at 07/05/19 0800 .  0.9 %  sodium chloride infusion, , Intravenous, Continuous, Costella, Vincent J, PA-C .  acetaminophen (TYLENOL) tablet 650 mg, 650 mg, Oral, Q4H PRN **OR** acetaminophen (TYLENOL) suppository 650 mg, 650 mg, Rectal, Q4H PRN, Costella, Vincent J, PA-C .  amLODipine (NORVASC) tablet 10  mg, 10 mg, Oral, Daily, Costella, Vincent J, PA-C, 10 mg at 07/07/19 0904 .  bisacodyl (DULCOLAX) suppository 10 mg, 10 mg, Rectal, Daily PRN, Costella, Vincent J, PA-C, 10 mg at 07/04/19 0845 .  Chlorhexidine Gluconate Cloth 2 % PADS 6 each, 6 each, Topical, Daily, Costella, Vista Mink, PA-C, 6 each at 07/07/19 0911 .  dexamethasone (DECADRON) injection 4 mg, 4 mg, Intravenous, Q6H, Consuella Lose, MD, 4 mg at 07/07/19 0539 .  docusate sodium (COLACE) capsule 100 mg, 100 mg, Oral, BID, Costella, Vincent J, PA-C, 100 mg at 07/07/19 0904 .  gabapentin (NEURONTIN) capsule 300 mg, 300 mg, Oral, TID, Costella, Vincent J, PA-C, 300 mg at 07/07/19 0904 .  glipiZIDE (GLUCOTROL) tablet 5 mg, 5 mg, Oral, QAC breakfast, Costella, Vincent J, PA-C, 5 mg at 07/07/19 0904 .  hydrALAZINE (APRESOLINE) tablet 10 mg, 10 mg, Oral, BID, Costella, Vincent J, PA-C, 10 mg at 07/07/19 0904 .  HYDROcodone-acetaminophen (NORCO/VICODIN) 5-325 MG per tablet 1 tablet, 1 tablet, Oral, Q4H PRN, Costella, Vista Mink, PA-C, 1 tablet at 07/03/19 1229 .  HYDROmorphone (DILAUDID) injection 0.5-1 mg, 0.5-1 mg, Intravenous, Q2H PRN, Costella, Vincent J, PA-C .  insulin aspart (novoLOG) injection 0-15 Units, 0-15 Units, Subcutaneous, TID WC, Costella, Vincent Lenna Sciara, PA-C, 5 Units at 07/07/19 [id number] .  insulin aspart (novoLOG) injection 0-5 Units, 0-5 Units, Subcutaneous, QHS, Costella, Vincent Lenna Sciara, PA-C, 3 Units at 07/05/19 2153 .  lactated ringers infusion, , Intravenous, Continuous, Annye Asa, MD, Stopped at 07/05/19 0800 .  losartan (COZAAR) tablet 100 mg, 100 mg, Oral, Daily, Costella, Vincent J, PA-C, 100 mg at 07/07/19 0904 .  menthol-cetylpyridinium (CEPACOL) lozenge 3 mg, 1 lozenge, Oral, PRN **OR** phenol (CHLORASEPTIC) mouth spray 1 spray, 1 spray, Mouth/Throat, PRN, Costella, Vincent J, PA-C .  methocarbamol (ROBAXIN) tablet 500 mg, 500 mg, Oral, Q6H PRN **OR** methocarbamol (ROBAXIN) 500 mg in dextrose 5 % 50 mL IVPB, 500 mg,  Intravenous, Q6H PRN, Costella, Vincent J, PA-C .  morphine 2 MG/ML injection 2 mg, 2 mg, Intravenous, Q2H PRN, Costella, Vincent J, PA-C, 2 mg at 07/01/19 0544 .  ondansetron (ZOFRAN) tablet 4 mg, 4 mg, Oral, Q6H PRN **OR** ondansetron (ZOFRAN) injection 4  mg, 4 mg, Intravenous, Q6H PRN, Costella, Vincent J, PA-C, 4 mg at 07/06/19 1841 .  oxyCODONE (Oxy IR/ROXICODONE) immediate release tablet 5-10 mg, 5-10 mg, Oral, Q3H PRN, Costella, Vincent J, PA-C, 10 mg at 07/06/19 2040 .  polyethylene glycol (MIRALAX / GLYCOLAX) packet 17 g, 17 g, Oral, Daily PRN, Costella, Vincent J, PA-C .  pravastatin (PRAVACHOL) tablet 20 mg, 20 mg, Oral, Daily, Costella, Vincent J, PA-C, 20 mg at 07/07/19 0903 .  senna (SENOKOT) tablet 8.6 mg, 1 tablet, Oral, BID, Costella, Vincent J, PA-C, 8.6 mg at 07/07/19 0903 .  senna-docusate (Senokot-S) tablet 1 tablet, 1 tablet, Oral, QHS PRN, Costella, Vincent J, PA-C .  sodium phosphate (FLEET) 7-19 GM/118ML enema 1 enema, 1 enema, Rectal, Once PRN, Costella, Vista Mink, PA-C .  tamsulosin (FLOMAX) capsule 0.4 mg, 0.4 mg, Oral, Daily, Bergman, Meghan D, NP, 0.4 mg at 07/07/19 0903 .  zolpidem (AMBIEN) tablet 5 mg, 5 mg, Oral, QHS PRN, Costella, Vista Mink, PA-C   Patients Current Diet:     Diet Order                      Diet regular Room service appropriate? Yes; Fluid consistency: Thin  Diet effective now                   Precautions / Restrictions Precautions Precautions: Fall, Back Precaution Booklet Issued: No Precaution Comments: pt able to recall 3/3 back precautions Restrictions Weight Bearing Restrictions: No    Has the patient had 2 or more falls or a fall with injury in the past year?Yes   Prior Activity Level Community (5-7x/wk): Independent, driving, retired, active   Prior Functional Level Prior Function Level of Independence: Independent Comments: progressive weakness in RLE over last 2 months transitioning from cane to RW to wheelchair for  mobility. Normally independent, gardens and fishes   Self Care: Did the patient need help bathing, dressing, using the toilet or eating?  Independent   Indoor Mobility: Did the patient need assistance with walking from room to room (with or without device)? Independent   Stairs: Did the patient need assistance with internal or external stairs (with or without device)? Independent   Functional Cognition: Did the patient need help planning regular tasks such as shopping or remembering to take medications? [place] / Equipment Home Equipment: Environmental consultant - 2 wheels, Cane - single point, Wheelchair - manual, Shower seat   Prior Device Use: Indicate devices/aids used by the patient prior to current illness, exacerbation or injury? Was Independent without AD, began using cane and then to wheelchair on admit   Current Functional Level Cognition   Overall Cognitive Status: Within Functional Limits for tasks assessed Orientation Level: Oriented X4 General Comments: pt very motivated to get better    Extremity Assessment (includes Sensation/Coordination)   Upper Extremity Assessment: Overall WFL for tasks assessed  Lower Extremity Assessment: Defer to PT evaluation RLE Deficits / Details: flaccid, 0/5, sensation WFL LLE Deficits / Details: flaccid, 0/5, sensation WFL     ADLs   Overall ADL's : Needs assistance/impaired Eating/Feeding: Independent Grooming: Wash/dry hands, Wash/dry face, Oral care, Brushing hair, Moderate assistance, Sitting Grooming Details (indicate cue type and reason): sitting EOB - assist for balance  Upper Body Bathing: Moderate assistance, Sitting Upper Body Bathing Details (indicate cue type and reason): EOB sitting - assist for balance  Lower Body Bathing: Maximal assistance, Bed level Upper Body Dressing : Maximal assistance, Sitting, Bed  level Lower Body Dressing: Total assistance, Bed level Toilet Transfer: Total assistance Toileting-  Clothing Manipulation and Hygiene: Total assistance, Bed level Functional mobility during ADLs: Maximal assistance     Mobility   Overal bed mobility: Needs Assistance Bed Mobility: Rolling, Sidelying to Sit Rolling: Min assist(heavy use of bed rail) Sidelying to sit: Max assist Sit to sidelying: Max assist General bed mobility comments: pt used bed rail to pull self over to R sidelying. maxA for LE management and modA for trunk elevation, pt with great effort and heavy reliance on bed rail     Transfers   Overall transfer level: Needs assistance Equipment used: None Transfers: Lateral/Scoot Transfers  Lateral/Scoot Transfers: Mod assist General transfer comment: pt able to lateral scoot to recliner with max directional cues and modA via bed pad to assist in transfer     Ambulation / Gait / Stairs / Wheelchair Mobility   Ambulation/Gait General Gait Details: unable, pt paraplegic     Posture / Balance Dynamic Sitting Balance Sitting balance - Comments: pt able to maintain EOB sitting balance with hands on knees with close supervision, however when asked to sit up straight or to move hands pt requiring maxA to prevent fall Balance Overall balance assessment: Needs assistance Sitting-balance support: Bilateral upper extremity supported Sitting balance-Leahy Scale: Poor Sitting balance - Comments: pt able to maintain EOB sitting balance with hands on knees with close supervision, however when asked to sit up straight or to move hands pt requiring maxA to prevent fall Postural control: Left lateral lean     Special needs/care consideration Bladder management 16 FR catheter placed 5/14 Bowel management incontinent; needs bowel regimen Designated visitors are wife, [name] and son , Clifton James Skin with surgical incision    Previous Home Environment  Living Arrangements: Spouse/significant other  Lives With: Spouse Available Help at Discharge: Family, Available 24 hours/day Type of Home:  House Home Layout: One level Home Access: Stairs to enter Entrance Stairs-Rails: None Entrance Stairs-Number of Steps: 1 Bathroom Shower/Tub: Optometrist: Yes How Accessible: Accessible via walker Home Care Services: Yes Type of Home Care Services: Home PT   Discharge Living Setting Plans for Discharge Living Setting: Patient's home, Lives with (comment)(wife) Type of Home at Discharge: House Discharge Home Layout: One level Discharge Home Access: Stairs to enter Entrance Stairs-Rails: None Entrance Stairs-Number of Steps: 1 Discharge Bathroom Shower/Tub: Tub/shower unit Discharge Bathroom Toilet: Standard Discharge Bathroom Accessibility: Yes How Accessible: Accessible via walker Does the patient have any problems obtaining your medications?: No   Social/Family/Support Systems Patient Roles: Spouse, Parent(has 3 children) 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, Equities trader Anticipated Caregiver: wife and adult children Anticipated Caregiver's Contact Information: see above Caregiver Availability: 24/7 Is Caregiver In Agreement with Plan?: Yes Does Caregiver/Family have Issues with Lodging/Transportation while Pt is in Rehab?: Yes   Goals Patient/Family Goal for Rehab: supervision to min PT and OT at wheelchair level Expected length of stay: ELOS 20 to 25 days Pt/Family Agrees to Admission and willing to participate: Yes Program Orientation Provided & Reviewed with Pt/Caregiver Including Roles  & Responsibilities: Yes   Patient has already contacted [facility] a few weeks ago to have ramp built and bathroom made handicapped accessible.   Decrease burden of Care through IP rehab admission: n/a   Possible need for SNF placement upon discharge:not anticipated   Patient Condition: This patient's condition remains as documented in the consult dated 07/06/2019, in which the Rehabilitation Physician determined and documented that the patient's  condition is appropriate for intensive rehabilitative care in an inpatient rehabilitation facility. Will admit to inpatient rehab today.   Preadmission Screen Completed By:  Cleatrice Burke, RN, 07/07/2019 12:08 PM ______________________________________________________________________   Discussed status with Dr. Dagoberto Ligas on 07/07/2019 at  1208 and received approval for admission today.   Admission Coordinator:  Cleatrice Burke, time [id number] Date 07/07/2019             Cosigned by: Courtney Heys, MD at 07/07/2019 12:14 PM  Revision History

## 2019-07-07 NOTE — Progress Notes (Signed)
Inpatient Rehabilitation Admissions Coordinator  Inpt rehab bed is available today. I met with patient at [facility] and they are in agreement. I will make the arrangements to admit today. TOC team made aware, Kristi.  Barbara Boyette, RN, MSN Rehab Admissions Coordinator [phone] 07/07/2019 12:06 PM  

## 2019-07-07 NOTE — Progress Notes (Signed)
Lower venous duplex       has been completed. Preliminary results can be found under CV proc through chart review. Beata Beason, BS, RDMS, RVT   

## 2019-07-07 NOTE — Progress Notes (Signed)
  NEUROSURGERY PROGRESS NOTE   No issues overnight.  No change in motor function  EXAM:  BP 124/78 (BP Location: Left Arm)   Pulse 84   Temp 97.9 F (36.6 C) (Axillary)   Resp 10   Ht 5\' 10"  (1.778 m)   Wt 79.8 kg   SpO2 100%   BMI 25.25 kg/m   Awake, alert, oriented  Speech fluent, appropriate  CN grossly intact  5/5 BUE, 0/5 BLE  IMPRESSION/PLAN 69 y.o. male s/p thoracic laminectomy microdiscectomy [id number], complicated by thoracic hematoma s/p evacuation 5/14. Remains paraplegic. CIR candidate - dispo planning

## 2019-07-08 ENCOUNTER — Inpatient Hospital Stay (HOSPITAL_COMMUNITY): Payer: No Typology Code available for payment source | Admitting: Occupational Therapy

## 2019-07-08 ENCOUNTER — Inpatient Hospital Stay (HOSPITAL_COMMUNITY): Payer: No Typology Code available for payment source

## 2019-07-08 ENCOUNTER — Inpatient Hospital Stay (HOSPITAL_COMMUNITY): Payer: No Typology Code available for payment source | Admitting: Physical Therapy

## 2019-07-08 DIAGNOSIS — G8220 Paraplegia, unspecified: Secondary | ICD-10-CM

## 2019-07-08 LAB — BASIC METABOLIC PANEL
Anion gap: 9 (ref 5–15)
BUN: 24 mg/dL — ABNORMAL HIGH (ref 8–23)
CO2: 27 mmol/L (ref 22–32)
Calcium: 9.5 mg/dL (ref 8.9–10.3)
Chloride: 99 mmol/L (ref 98–111)
Creatinine, Ser: 1.1 mg/dL (ref 0.61–1.24)
GFR calc Af Amer: >60 mL/min (ref >60)
GFR calc non Af Amer: >60 mL/min (ref >60)
Glucose, Bld: 166 mg/dL — ABNORMAL HIGH (ref 70–99)
Potassium: 4.2 mmol/L (ref 3.5–5.1)
Sodium: 135 mmol/L (ref 135–145)

## 2019-07-08 LAB — GLUCOSE, CAPILLARY
Glucose-Capillary: 174 mg/dL — ABNORMAL HIGH (ref 70–99)
Glucose-Capillary: 133 mg/dL — ABNORMAL HIGH (ref 70–99)
Glucose-Capillary: 161 mg/dL — ABNORMAL HIGH (ref 70–99)
Glucose-Capillary: 229 mg/dL — ABNORMAL HIGH (ref 70–99)

## 2019-07-08 MED ORDER — CHLORHEXIDINE GLUCONATE CLOTH 2 % EX PADS
6.0000 | MEDICATED_PAD | Freq: Every day | CUTANEOUS | Status: DC
  Administered 2019-07-08 – 2019-07-10 (×3): 6 via TOPICAL

## 2019-07-08 NOTE — Progress Notes (Signed)
Inpatient Rehabilitation  Patient information reviewed and entered into eRehab system by Ora Bollig M. Reniyah Gootee, M.A., CCC/SLP, PPS Coordinator.  Information including medical coding, functional ability and quality indicators will be reviewed and updated through discharge.    

## 2019-07-08 NOTE — Evaluation (Signed)
Physical Therapy Assessment and Plan  Patient Details  Name: Timothy Davenport MRN: [id number] Date of Birth: [birth year]  PT Diagnosis: Paraplegia Rehab Potential:   ELOS:     Today's Date: 07/08/2019 PT Individual Time: 1255-1400  And 9:00-10:00 PT Individual Time Calculation (min): 65 min  And 60 min  Problem List:  Patient Active Problem List   Diagnosis Date Noted  . Paraplegia ([place]) 07/07/2019  . Neurogenic bowel 07/07/2019  . Neurogenic bladder 07/07/2019  . 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 Colon polyp   . Diabetes mellitus without complication ([place])   . Diabetic neuropathy ([place])   . HTN (hypertension)   . Patella fracture   . Renal calculi    Past Surgical History:  Past Surgical History:  Procedure Laterality Date  . LUMBAR LAMINECTOMY/DECOMPRESSION MICRODISCECTOMY N/A 07/01/2019   Procedure: LUMBAR LAMINECTOMY/DECOMPRESSION MICRODISCECTOMY 1 LEVEL, THORACIC SIX-SEVEN;  Surgeon: Consuella Lose, MD;  Location: [facility];  Service: Neurosurgery;  Laterality: N/A;  LUMBAR LAMINECTOMY/DECOMPRESSION MICRODISCECTOMY 1 LEVEL, THORACIC SIX-SEVEN   . ORIF PATELLA    . THORACIC DISCECTOMY N/A 07/03/2019   Procedure: Evacuation of Thoracic Hematoma;  Surgeon: Consuella Lose, MD;  Location: [facility];  Service: Neurosurgery;  Laterality: N/A;    Assessment & Plan Clinical Impression: Timothy Davenport is a 69 year old male with history of T2DM with neuropathy, recent TIA who started developing RLE weakness/numbness about two months ago with recurrent falls and was admitted on 06/30/19 after a fall with inability to move LLE and worsening of RLE weakness.  MRI brain was negative for acute changes but showed right parotid gland neoplasms.  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  He was  evaluated by Dr. Kathyrn Sheriff and taken to the OR on 05/12 for T6 and T7 laminectomy and right T6 and T7 pediculectomy for microdiscectomy at T6/T7.    Postop had issues with hypotension and dizziness with presyncope as well as urinary retention.  He was started on IV Decadron as follow-up MRI showed large hematoma in laminectomy bed and paraspinal soft tissues resulting in severe cord compression in conjunction to residual small disc protrusion and cord edema from T5 to mid T7.  Due to neurological decline he was taken back to the OR on 05/14 for evacuation of hematoma and on decadron taper.   He continues to have paraplegia but reports some increase in sensation. Therapy ongoing and CIR recommended due to functional decline in ability to complete ADLs   Patient transferred to CIR on 07/07/2019 .   Patient currently requires mod with mobility secondary to muscle weakness and muscle paralysis, decreased cardiorespiratoy endurance, abnormal tone and unbalanced muscle activation and decreased sitting balance, decreased postural control, decreased balance strategies and paraplegia.  Prior to hospitalization, patient was using wc for all w/exception of short distance ambulation due to recent decline in function.  Was active and independent prior to current issues with mobility and lived with Spouse in a House home.  Home access is 2Stairs to enter.  Patient will benefit from skilled PT intervention to maximize safe functional mobility, minimize fall risk and decrease caregiver burden for planned discharge home with 24 hour supervision.  Anticipate patient will benefit from follow up OP at discharge.  Endurance deficit:  Yes  Skilled Therapeutic Intervention Evaluation completed (see details above and below)  with education on PT POC and goals and individual treatment initiated with focus on functional mobility/transfers, LE strength, dynamic standing balance/coordination, ambulation, stair navigation, simulated  car transfers, and improved endurance with activity Seating and positioning - pt provided w/[id number] w/combo gel/foam cushion, once in chair legrests adjusted for optimal positioining and pressure relief.   Pt instructed w/efficient propulsion technique and educated re: risk of shoulder injury w/paraplegia/importance of good mechanics w/propulsion to prevent injury Pt instructed w/pressure releif via ant lean and performed using lap tray.  Discussed need for pressure relief 56mn every 30 min and risk of pressure sore development w/sensory deficits/paraplegia  Sitting balance - worked on static balance and posture in sitting.  Tactile cues to facilitate thoracic extension, attempted lumbar A/P rotation but this was challenging/pt unable.  Worked on sitting propped and advancing to hands on knees then single hand on knees.  Overall cga to min assist.   PM SESSION: PAIN denies pain this pm Pt initially oob in wc, reports he has been performing perssur relief as instructed and able to recall schedule/time. Pt propels wc [id number] level surface w/supervision.  Instructed w/techniqu for tight turns, requires hand over hand assist to coordinate w/repeated attempts. Car transfer performed w/mod assist, cues for set up/sequencing/safety using sliding board. wc to/from mat w/set up, mod assist to min assist, cues for sequencing/set up/foot placement.  Discussed use of leg loops to aid w/LE management. Core/sitting balance activities: Sitting alternating arm raises/single UE support on lap, min assist for balance. Seated weighted ball lift 1lb ball - initially uable w/bilat UEs so alternating UE use then progressed to lifting ball to chest height w/min assist for balance. Sitting boosting L/R w/min assist for balance Demonstrated advanced pivot transfer and discussed as future option for efficiency as trunk control/balance improves. Pt also educated re: what was meant by bowel program, self cath for bladder  management Mat to wc using sliding board w/cues for LE positioining/set up and mod assist to min assist. Pt transported to room at end of session due to time limitations and  Pt left oob in wc w/alarm belt set and needs in reach  PT Evaluation Precautions/Restrictions Precautions Precaution Comments: spinal Restrictions Weight Bearing Restrictions: No General   Vital SignsTherapy Vitals Temp: 97.8 F (36.6 C) Pulse Rate: 71 Resp: 16 BP: 102/79 Patient Position (if appropriate): Sitting Oxygen Therapy SpO2: 100 % O2 Device: Room Air Pain  denies pain Home Living/Prior Functioning Lives w/wife in single level home w/one STE, has consulted w[facility] to request ramp, walk in shower Home Living Available Help at Discharge: Family;Available 24 hours/day Type of Home: House Home Access: Stairs to enter [employer] Steps: 1 Entrance Stairs-Rails: None Home Layout: One level Bathroom Shower/Tub: Tub/shower unit;Other (comment) Bathroom Toilet: Handicapped height Bathroom Accessibility: Yes Additional Comments: reports his current w/c is too wide for him and does not fit into the bathroom  Lives With: Spouse Prior Function Level of Independence: Requires assistive device for independence;Needs assistance with gait  Able to Take Stairs?: Yes Driving: Yes Vocation: Retired Leisure: Hobbies-yes (Comment) Comments: fardening, fishing Vision/Perception wears reading glasses    Cognition Overall Cognitive Status: Within Functional Limits for tasks assessed Arousal/Alertness: Awake/alert Attention: Focused;Sustained Focused Attention: Appears intact Sustained Attention: Appears intact Memory: Appears intact Immediate Memory Recall: Blue;Bed;Sock Memory Recall Sock: Without Cue Memory Recall Blue: Without Cue Memory Recall Bed: Without Cue Awareness: Appears intact Problem Solving: Appears intact Safety/Judgment: Appears intact Sensation Proprioception intact  except L great toe, light touch  impaired bilat upper thighs and random LE distributions Motor  Motor Motor: Paraplegia;Abnormal postural alignment and control  Mobility Bed Mobility Rolling Right: Moderate Assistance - Patient 50-74% Rolling Left: Moderate Assistance - Patient 50-74% Left Sidelying to Sit: Maximal Assistance - Patient 25-49% Scooting to [facility]: Minimal Assistance - Patient > 75% Transfers Transfers: Lateral/Scoot Transfers Lateral/Scoot Transfers: Moderate Assistance - Patient 50-74% Locomotion    NA Trunk/Postural Assessment  Cervical Assessment Cervical Assessment: Within Functional Limits Thoracic Assessment Thoracic Assessment: (post sx- forward posture) Lumbar Assessment Lumbar Assessment: (posterior pelvic tilt) Postural Control Trunk Control: impaired due to weakness, unable to correct post pelvic tilt, able to partially extend thoracic spine and retract shoulder blades w/cues, difficulty maintainning  Balance Balance Balance Assessed: Yes Static Sitting Balance Static Sitting - Balance Support: Feet supported;No upper extremity supported Static Sitting - Level of Assistance: 4: Min assist Dynamic Sitting Balance Dynamic Sitting - Level of Assistance: 4: Min assist;3: Mod assist Sitting balance - Comments: able to static sit w/hands in lap w/cga, mild sway.  able to sit w/single UE support, briefly without UE support.  poor dynamic sitting balance Extremity Assessment    LUE Assessment LUE Assessment: Within Functional Limits RLE Assessment Passive Range of Motion (PROM) Comments: WNL Active Range of Motion (AROM) Comments: see strength General Strength Comments: hip add 2/5, all others 0/5      Refer to Care Plan for Long Term Goals  Recommendations for other services: Therapeutic Recreation  Outing/community reintegration and Other adaptive equipment/community resources  Discharge Criteria: Patient will be discharged from PT if patient refuses  treatment 3 consecutive times without medical reason, if treatment goals not met, if there is a change in medical status, if patient makes no progress towards goals or if patient is discharged from hospital.  The above assessment, treatment plan, treatment alternatives and goals were discussed and mutually agreed upon: by patient  Jerrilyn Cairo 07/08/2019, 4:38 PM

## 2019-07-08 NOTE — Plan of Care (Signed)
  Problem: RH BOWEL ELIMINATION Goal: RH STG MANAGE BOWEL WITH ASSISTANCE Description: STG Manage Bowel with min assist  Outcome: Progressing Goal: RH STG MANAGE BOWEL W/MEDICATION W/ASSISTANCE Description: STG Manage Bowel with Medication with min assist Outcome: Progressing   Problem: RH SAFETY Goal: RH STG ADHERE TO SAFETY PRECAUTIONS W/ASSISTANCE/DEVICE Description: STG Adhere to Safety Precautions With min assist Outcome: Progressing Goal: RH STG DECREASED RISK OF FALL WITH ASSISTANCE Description: STG Decreased Risk of Fall With Assistance. Outcome: Progressing   Problem: RH PAIN MANAGEMENT Goal: RH STG PAIN MANAGED AT OR BELOW PT'S PAIN GOAL Description: Pain score of 4 or less Outcome: Progressing

## 2019-07-08 NOTE — Plan of Care (Signed)
Nutrition Education Note  RD consulted for nutrition education regarding diabetes. RD working remotely. Of note, current A1C is in pre-diabetic range.  Spoke with pt via phone call to room. Pt in good spirits and states that his first day of therapies is going well so far. Pt reports that his appetite is good and that he is eating well. Pt reports that he has some nausea but that it resolved a few days ago.  Pt reports that he is able to keep his blood sugar under control at home with his diet and exercise habits. Discussed pt's typical PO intake. Pt reports that he usually eats 3 meals and may have 1-2 snacks daily between meals.  Breakfast: 2 scrambled eggs, 2 pieces of bacon, 1/2 bowel of grits Lunch: tuna fish sandwich or hotdog, water Dinner: chicken sandwich or burger Snacks: pudding or peaches  Pt reports that he drinks mostly water but will occasionally have a small can of soda of 8 oz of apple or cranberry juice.  Pt with multiple questions regarding fast food, restaurant food, flavored beverages, etc. All questions answered. Encouraged pt to keep a list of any additional questions and RD can return to address any further questions.  Lab Results  Component Value Date   HGBA1C 6.0 (H) 06/30/2019    RD has attached "Carbohydrate Counting for People with Diabetes" handout from the Academy of [employer] Dietetics to pt's AVS/Discharge Instructions. Discussed different food groups and their effects on blood sugar, emphasizing carbohydrate-containing foods. Provided list of carbohydrates and recommended serving sizes of common foods.  Discussed importance of controlled and consistent carbohydrate intake throughout the day. Provided examples of ways to balance meals/snacks and encouraged intake of high-fiber, whole grain complex carbohydrates. Teach back method used.  Expect good compliance.  Body mass index is 25.25 kg/m. Pt meets criteria for overweight based on current  BMI.  Current diet order is Carb Modified, patient is consuming approximately 100% of meals at this time. Labs and medications reviewed. No further nutrition interventions warranted at this time. RD contact information provided. If additional nutrition issues arise, please re-consult RD.   Gaynell Face, MS, RD, LDN Inpatient Clinical Dietitian Pager: [phone] Weekend/After Hours: [phone]

## 2019-07-08 NOTE — Progress Notes (Signed)
Physical Therapy Session Note  Patient Details  Name: Timothy Davenport MRN: [id number] Date of Birth: 1950/12/16  Today's Date: 07/08/2019 PT Individual Time: [id number] PT Individual Time Calculation (min): 30 min   Short Term Goals: Week 1:  PT Short Term Goal 1 (Week 1): Pt will perform level SBT w/cga PT Short Term Goal 2 (Week 1): Pt will be I w/pressure relief management PT Short Term Goal 3 (Week 1): Pt will maintain static sitting x 5 min w/cga on mat PT Short Term Goal 4 (Week 1): Pt will roll w/supervision and cues maintaining spinal precautions  Skilled Therapeutic Interventions/Progress Updates: Pt presents sitting in w/c, leaning forward onto tray table, stretching back.  Pt states was just calling to return to bed.  Pt able to push up from w/c to place slide board, w/ feet positioned.  Pt performed slide board transfer w/ min A and occasional verbal cues.  Pt scooted back in bed, w/ feet remaining on floor.  Pt performed overhead reach unilaterally w/ occasional LOB, as well as forward lean to find limits of stability and back to neutral.  Pt requires mod A when goes too far.  Pt performed sit to left sidelying w/ max A for LEs and then rolls to supine.  Pt rolled to left w/ mod A to straighten chux sheet.  Bilateral TEDS removed and PRAFOs applied for comfort.  Bed alarm on and all needs in reach.     Therapy Documentation Precautions:  Precautions Precautions: Fall, Back Precaution Booklet Issued: No Precaution Comments: spinal Restrictions Weight Bearing Restrictions: No General:   Vital Signs: Therapy Vitals Temp: 97.8 F (36.6 C) Pulse Rate: 71 Resp: 16 BP: 102/79 Patient Position (if appropriate): Sitting Oxygen Therapy SpO2: 100 % O2 Device: Room Air Pain:  pt states pain of 6/10 in back. Mobility: Bed Mobility Rolling Right: Moderate Assistance - Patient 50-74% Rolling Left: Moderate Assistance - Patient 50-74% Left Sidelying to Sit: Maximal Assistance  - Patient 25-49% Scooting to [facility]: Minimal Assistance - Patient > 75% Transfers Transfers: Lateral/Scoot Transfers Lateral/Scoot Transfers: Moderate Assistance - Patient 50-74% Locomotion :    Trunk/Postural Assessment : Cervical Assessment Cervical Assessment: Within Functional Limits Thoracic Assessment Thoracic Assessment: (post sx- forward posture) Lumbar Assessment Lumbar Assessment: (posterior pelvic tilt) Postural Control Trunk Control: impaired due to weakness, unable to correct post pelvic tilt, able to partially extend thoracic spine and retract shoulder blades w/cues, difficulty maintainning  Balance: Balance Balance Assessed: Yes Static Sitting Balance Static Sitting - Balance Support: Feet supported;No upper extremity supported Static Sitting - Level of Assistance: 4: Min assist Dynamic Sitting Balance Dynamic Sitting - Level of Assistance: 4: Min assist;3: Mod assist Sitting balance - Comments: able to static sit w/hands in lap w/cga, mild sway.  able to sit w/single UE support, briefly without UE support.  poor dynamic sitting balance Exercises:   Other Treatments:      Therapy/Group: Individual Therapy  Ladoris Gene 07/08/2019, 4:08 PM

## 2019-07-08 NOTE — Progress Notes (Signed)
[facility] PHYSICAL MEDICINE & REHABILITATION PROGRESS NOTE   Subjective/Complaints:  Pt reports had an extremely large BM last night after bowel program/enema. Also wnet a little in bedpan this AM-  Denies nausea, but has bag at bedside "just in case".   Pain pretty well controlled with pain meds.     ROS:  Pt denies SOB, abd pain, CP, N/V/C/D, and vision changes   Objective:   DG Abd Portable 1V  Result Date: 07/07/2019 CLINICAL DATA:  Clinical constipation. EXAM: PORTABLE ABDOMEN - 1 VIEW COMPARISON:  None. FINDINGS: Supine abdomen shows no gaseous small bowel or colonic distention. Small to moderate stool volume throughout the colon. Visualized bony anatomy unremarkable. IMPRESSION: Small to moderate stool volume. Electronically Signed   By: Misty Stanley M.D.   On: 07/07/2019 17:52   VAS [place] LOWER EXTREMITY VENOUS (DVT)  Result Date: 07/07/2019  Lower Venous DVTStudy Indications: Edema, and BLE PLEGIA.  Comparison Study: none Performing Technologist: June Leap RDMS, RVT  Examination Guidelines: A complete evaluation includes B-mode imaging, spectral Doppler, color Doppler, and power Doppler as needed of all accessible portions of each vessel. Bilateral testing is considered an integral part of a complete examination. Limited examinations for reoccurring indications may be performed as noted. The reflux portion of the exam is performed with the patient in reverse Trendelenburg.  +---------+---------------+---------+-----------+----------+--------------+ RIGHT    CompressibilityPhasicitySpontaneityPropertiesThrombus Aging +---------+---------------+---------+-----------+----------+--------------+ CFV      Full           Yes      Yes                                 +---------+---------------+---------+-----------+----------+--------------+ SFJ      Full                                                         +---------+---------------+---------+-----------+----------+--------------+ FV Prox  Full                                                        +---------+---------------+---------+-----------+----------+--------------+ FV Mid   Full                                                        +---------+---------------+---------+-----------+----------+--------------+ FV DistalFull                                                        +---------+---------------+---------+-----------+----------+--------------+ PFV      Full                                                        +---------+---------------+---------+-----------+----------+--------------+  POP      Full           Yes      Yes                                 +---------+---------------+---------+-----------+----------+--------------+ PTV      Full                                                        +---------+---------------+---------+-----------+----------+--------------+ PERO     Full                                                        +---------+---------------+---------+-----------+----------+--------------+   +---------+---------------+---------+-----------+----------+--------------+ LEFT     CompressibilityPhasicitySpontaneityPropertiesThrombus Aging +---------+---------------+---------+-----------+----------+--------------+ CFV      Full           Yes      Yes                                 +---------+---------------+---------+-----------+----------+--------------+ SFJ      Full                                                        +---------+---------------+---------+-----------+----------+--------------+ FV Prox  Full                                                        +---------+---------------+---------+-----------+----------+--------------+ FV Mid   Full                                                         +---------+---------------+---------+-----------+----------+--------------+ FV DistalFull                                                        +---------+---------------+---------+-----------+----------+--------------+ PFV      Full                                                        +---------+---------------+---------+-----------+----------+--------------+ POP      Full           Yes      Yes                                 +---------+---------------+---------+-----------+----------+--------------+  PTV      Full                                                        +---------+---------------+---------+-----------+----------+--------------+ PERO     Full                                                        +---------+---------------+---------+-----------+----------+--------------+     Summary: BILATERAL: - No evidence of deep vein thrombosis seen in the lower extremities, bilaterally. -No evidence of popliteal cyst, bilaterally.   *See table(s) above for measurements and observations. Electronically signed by Deitra Mayo MD on 07/07/2019 at 12:55:29 PM.    Final    Recent Labs    07/07/19 1832  WBC 13.2*  HGB 11.4*  HCT 34.5*  PLT 141*   Recent Labs    07/07/19 1832 07/08/19 0640  NA 134* 135  K 3.6 4.2  CL 99 99  CO2 29 27  GLUCOSE 119* 166*  BUN 26* 24*  CREATININE 1.13 1.10  CALCIUM 9.3 9.5    Intake/Output Summary (Last 24 hours) at 07/08/2019 0908 Last data filed at 07/08/2019 0730 Gross per 24 hour  Intake 260 ml  Output 1025 ml  Net -765 ml     Physical Exam: Vital Signs Blood pressure 117/86, pulse (!) 56, temperature 98.7 F (37.1 C), temperature source Oral, resp. rate 18, height 5\' 10"  (1.778 m), SpO2 100 %.  Physical Exam  Constitutional: awake, alert, laying supine in bed; NAD HENT: conjugate gaze R eye additional skin tag- medial lower eye  Cardiovascular: RRR Respiratory: CTA B/L- good air movement B/L GI:  soft, NT, ND, (+)BS  Musculoskeletal:        General: No edema.     Comments: UEs deltoids, biceps, triceps, WE, grip and finger abd 5/5 B/L 0/5 in HF, KE, KF, DF and PF B/L  Neurological: He is alert and oriented to person, place, and time.  Mildly decreased sensation reduction from  L1 to S5 B/L- however pt said it's "mild"- can feel everything from "head to bottom" No hoffman's B/L Skin:  IV in R antecubital fossa- no infiltration No skin breakdown on heels - backside- nurse agrees Psychiatric: He has a normal mood and affect. His behavior is normal.   Assessment/Plan: 1. Functional deficits secondary to T8 paraplegia which require 3+ hours per day of interdisciplinary therapy in a comprehensive inpatient rehab setting.  Physiatrist is providing close team supervision and 24 hour management of active medical problems listed below.  Physiatrist and rehab team continue to assess barriers to discharge/monitor patient progress toward functional and medical goals  Care Tool:  Bathing              Bathing assist       Upper Body Dressing/Undressing Upper body dressing        Upper body assist      Lower Body Dressing/Undressing Lower body dressing            Lower body assist       Toileting Toileting    Toileting assist Assist for toileting: Total Assistance - Patient < 25%  Transfers Chair/bed transfer  Transfers assist           Locomotion Ambulation   Ambulation assist              Walk 10 feet activity   Assist           Walk 50 feet activity   Assist           Walk 150 feet activity   Assist           Walk 10 feet on uneven surface  activity   Assist           Wheelchair     Assist               Wheelchair 50 feet with 2 turns activity    Assist            Wheelchair 150 feet activity     Assist          Blood pressure 117/86, pulse (!) 56, temperature 98.7 F  (37.1 C), temperature source Oral, resp. rate 18, height 5\' 10"  (1.778 m), SpO2 100 %.   Medical Problem List and Plan: 1.  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              -patient may  shower             -ELOS/Goals: 3-3.5 weeks- goals Mod I to min assist 2.  Antithrombotics: -DVT/anticoagulation:  Mechanical: Sequential compression devices, below knee Bilateral lower extremities Dopplers today were negative.   -need to determine when OK to start Lovenox- due to high risk of DVT.             -antiplatelet therapy: N/A--has been off Plavix.  3. Pain Management: Continue gabapentin tid with oxycodone prn.  4. Mood: LCSW to follow for evaluation and support.              -antipsychotic agents: N/a 5. Neuropsych: This patient is capable of making decisions on his own behalf. 6. Skin/Wound Care: Routine pressure relief measures.  Discussed with pt in depth how necessary pressure relief every 15-20 minutes is essential to prevent pressure ulcers and morbidity/complications. 7. Fluids/Electrolytes/Nutrition: Monitor I/O. Check lytes in am.  8. Cord compression:On decadron 4 mg IV every 6 hours-->will start taper 9. HTN: Monitor BP tid--continue Norvasc, Cozaar and hydralazine.   10. T2DM: Monitor BS ac/hs. On Glucotrol daily with SSI for elevated BS- likely made worse by Decadron.  11. Neurogenic bladder: Continue Flomax. Discussed at length Foley vs in/out caths- has foley- asked pt and wife to discuss long term what would work best for them.  5/19- thinking about foley vs in/out caths   12. Hematochezia with Neurogenic bowel: Last BM 5/15 with bloody streaks. Will order KUB to evaluate stool burden. Continue Senna S. Will administer Miralax and enema today. Then start on bowel program nightly after dinner.  5/19- had a very large BM with enema last night- sounds like cleaned out.   13. CKD  Stage IIIa: Will order post op labs in am to monitor lytes/renal status.  5/19- Cr 1.10 this AM 14. Leucocytosis: Question/likely due to steroids. Monitor for signs of infection. Will check labs in am.  5/19- WBC 13.2- but still on decadron- no signs of infection-will monitor  15. ABLA: Last H/H checked prior to hematoma evacuation on 05/13-->recheck post op CBC in am.  5/19- Hb  stable, however platelets down to 141 - will monitor closely.   16. Hypokalemia: Last K+-3.1 --will recheck in am.  5/19- K+ 4.2- con't to monitor        LOS: 1 days A FACE TO FACE EVALUATION WAS PERFORMED  Genesi Stefanko 07/08/2019, 9:08 AM

## 2019-07-08 NOTE — Progress Notes (Signed)
Per dayshift, pt received miralax and enema once he arrived onto unit. @1800 , pt had a large type 6 bowel movement.  Pt had 2 more bowel movements during this shift, @0539  and @0645 .

## 2019-07-08 NOTE — Evaluation (Signed)
Occupational Therapy Assessment and Plan  Patient Details  Name: Timothy Davenport MRN: [id number] Date of Birth: 07-05-1950  OT Diagnosis: acute pain and paraplegia at level T6/7 Rehab Potential: Rehab Potential (ACUTE ONLY): Good ELOS: ~2.5-3 weeks   Today's Date: 07/08/2019 OT Individual Time: 1100-1155 OT Individual Time Calculation (min): 55 min     Problem List:  Patient Active Problem List   Diagnosis Date Noted  . Paraplegia ([place]) 07/07/2019  . Neurogenic bowel 07/07/2019  . Neurogenic bladder 07/07/2019  . 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 Colon polyp   . Diabetes mellitus without complication ([place])   . Diabetic neuropathy ([place])   . HTN (hypertension)   . Patella fracture   . Renal calculi    Past Surgical History:  Past Surgical History:  Procedure Laterality Date  . LUMBAR LAMINECTOMY/DECOMPRESSION MICRODISCECTOMY N/A 07/01/2019   Procedure: LUMBAR LAMINECTOMY/DECOMPRESSION MICRODISCECTOMY 1 LEVEL, THORACIC SIX-SEVEN;  Surgeon: Consuella Lose, MD;  Location: [place];  Service: Neurosurgery;  Laterality: N/A;  LUMBAR LAMINECTOMY/DECOMPRESSION MICRODISCECTOMY 1 LEVEL, THORACIC SIX-SEVEN   . ORIF PATELLA    . THORACIC DISCECTOMY N/A 07/03/2019   Procedure: Evacuation of Thoracic Hematoma;  Surgeon: Consuella Lose, MD;  Location: [place] [facility];  Service: Neurosurgery;  Laterality: N/A;    Assessment & Plan Clinical Impression: Patient is a 69 y.o. year old male with history of T2DM with neuropathy, recent TIA who started developing RLE weakness/numbness about two months ago with recurrent falls and was admitted on 06/30/19 after a fall with inability to move LLE and worsening of RLE weakness.  MRI brain was negative for acute changes but showed right parotid gland neoplasms.  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  He was evaluated by Dr. Kathyrn Sheriff and taken to the OR on 05/12 for T6 and T7 laminectomy and right T6 and T7 pediculectomy for microdiscectomy at T6/T7.    Postop had issues with hypotension and dizziness with presyncope as well as urinary retention.  He was started on IV Decadron as follow-up MRI showed large hematoma in laminectomy bed and paraspinal soft tissues resulting in severe cord compression in conjunction to residual small disc protrusion and cord edema from T5 to mid T7.  Due to neurological decline he was taken back to the OR on 05/14 for evacuation of hematoma and on decadron taper.   He continues to have paraplegia but reports some increase in sensation. Therapy ongoing and CIR recommended due to functional decline in ability to complete ADLs and in mobility.    Patient reports he has no movement of his LEs, but has intact movement of his arms- LBM 2 days ago- was liquidy- with enema- and  Was small.  Has a foley catheter since unable to void.  Patient transferred to CIR on 07/07/2019 .    Patient currently requires mod to total A with basic self-care skills and mod A for basic transfers  secondary to muscle weakness, decreased cardiorespiratoy endurance, impaired timing and sequencing and unbalanced muscle activation and decreased sitting balance, decreased postural control and paraplegia.  Prior to hospitalization, patient could complete ADL with I to A with declining function.  Patient will benefit from skilled intervention to decrease level of assist with basic self-care skills and increase independence with basic self-care skills prior to discharge home with care partner.  Anticipate  patient will require intermittent supervision and follow up outpatient.  OT - End of Session Activity Tolerance: Tolerates 30+ min activity without fatigue Endurance Deficit: No OT Assessment Rehab Potential (ACUTE ONLY): Good OT Barriers to Discharge: Neurogenic  Bowel & Bladder OT Patient demonstrates impairments in the following area(s): Balance;Perception;Sensory;Edema;Endurance;Motor;Pain;Skin Integrity;Safety OT Basic ADL's Functional Problem(s): Grooming;Bathing;Dressing;Toileting OT Advanced ADL's Functional Problem(s): Simple Meal Preparation OT Transfers Functional Problem(s): Toilet;Tub/Shower OT Additional Impairment(s): None OT Plan OT Intensity: Minimum of 1-2 x/day, 45 to 90 minutes OT Frequency: 5 out of 7 days OT Duration/Estimated Length of Stay: ~2.5-3 weeks OT Treatment/Interventions: Balance/vestibular training;Discharge planning;Functional electrical stimulation;Pain management;Self Care/advanced ADL retraining;Therapeutic Activities;UE/LE Coordination activities;Disease mangement/prevention;Functional mobility training;Patient/family education;Skin care/wound managment;Therapeutic Exercise;Community reintegration;DME/adaptive equipment instruction;Neuromuscular re-education;Psychosocial support;Splinting/orthotics;UE/LE Strength taining/ROM;Wheelchair propulsion/positioning OT Self Feeding Anticipated Outcome(s): n/a OT Basic Self-Care Anticipated Outcome(s): contact guard to mod I OT Toileting Anticipated Outcome(s): contact guard OT Bathroom Transfers Anticipated Outcome(s): supervision OT Recommendation Recommendations for Other Services: Neuropsych consult;Therapeutic Recreation consult Therapeutic Recreation Interventions: Outing/community reintergration Patient destination: Home Follow Up Recommendations: Outpatient OT Equipment Recommended: To be determined   Skilled Therapeutic Intervention Ot eval initiated with Ot goals, purpose and role discussed. Pt had already perform bathing and dressing tasks and was dressed. Donned Teds with total A. Began to discuss and practice management of LEs in dressing tasks. Discussed and education on toilet transfers to [facility]. Brought in one for future transfers at end of session.  Transferred into the mat in the gym with slide board with mod A with education proper board placement, protection of skin, head/ hip ratio. On EOM addressed sitting balance with and without UE support (requiring min to mod A). Pt got into supine with max A with A for bilateral Le. Performed UB strengthening exercises with light weights 3-4 lbs in all planes with back supported to further strengthen UB in rep for ADL tasks and propelling w/c in home and community sitting.  Transfered back to mat with min A with focus on education and practice of placing the board in prep for transfers and managing Les in transfer. Left sitting up in w/c at end of session    OT Evaluation Precautions/Restrictions  Precautions Precautions: Fall;Back Precaution Booklet Issued: No Precaution Comments: spinal Restrictions Weight Bearing Restrictions: No General Chart Reviewed: Yes Family/Caregiver Present: No Vital Signs  Pain Pain Assessment Pain Scale: 0-10 Home Living/Prior Functioning Home Living Family/patient expects to be discharged to:: Private residence Living Arrangements: Spouse/significant other Available Help at Discharge: Family, Available 24 hours/day Type of Home: House Home Access: Stairs to enter Technical brewer of Steps: 2 Entrance Stairs-Rails: None Home Layout: One level Bathroom Shower/Tub: Tub/shower unit, Other (comment) Bathroom Toilet: Handicapped height Bathroom Accessibility: Yes Additional Comments: reports his current w/c is too wide for him and does not fit into the bathroom  Lives With: Spouse Prior Function Level of Independence: Requires assistive device for independence, Needs assistance with gait  Able to Take Stairs?: Yes Driving: Yes Vocation: Retired Comments: fardening, fishing ADL ADL Eating: (P) Independent Grooming: (P) Setup Upper Body Dressing: (P) Maximal assistance(performed with PT) Lower Body Dressing: (P) Dependent(performed with  PT) Toileting: (P) Dependent Where Assessed-Toileting: (P) Bed level Toilet Transfer: (P) Not assessed(simulated mod A - now has the right comode in his room)) Vision Baseline Vision/History: Wears glasses Wears Glasses: Reading only Patient Visual Report: No change from baseline Vision Assessment?: No apparent visual deficits Perception  Perception: Within Functional Limits Praxis Praxis: Intact Cognition Overall Cognitive Status: Within Functional Limits for tasks  assessed Arousal/Alertness: Awake/alert Orientation Level: Person;Place;Situation Person: Oriented Place: Oriented Situation: Oriented Year: 2021 Month: May Day of Week: Correct Memory: Appears intact Immediate Memory Recall: Blue;Bed;Sock Memory Recall Sock: Without Cue Memory Recall Blue: Without Cue Memory Recall Bed: Without Cue Attention: Focused;Sustained Focused Attention: Appears intact Sustained Attention: Appears intact Awareness: Appears intact Problem Solving: Appears intact Safety/Judgment: Appears intact Sensation Sensation Light Touch: Impaired by gross assessment Proprioception: Impaired Detail Proprioception Impaired Details: Absent RLE;Absent LLE Coordination Gross Motor Movements are Fluid and Coordinated: No Fine Motor Movements are Fluid and Coordinated: Yes Finger Nose Finger Test: intact Heel Shin Test: unable Motor  Motor Motor: Paraplegia;Abnormal postural alignment and control Mobility  Bed Mobility Bed Mobility: Rolling Right;Rolling Left;Left Sidelying to Sit;Scooting to [facility] Rolling Right: Moderate Assistance - Patient 50-74% Rolling Left: Moderate Assistance - Patient 50-74% Left Sidelying to Sit: Maximal Assistance - Patient 25-49% Scooting to HOB: Minimal Assistance - Patient > 75%  Trunk/Postural Assessment  Cervical Assessment Cervical Assessment: Within Functional Limits Thoracic Assessment Thoracic Assessment: (post sx- forward posture) Lumbar Assessment Lumbar  Assessment: (posterior pelvic tilt) Postural Control Postural Control: Deficits on evaluation Trunk Control: impaired due to weakness, unable to correct post pelvic tilt, able to partially extend thoracic spine and retract shoulder blades w/cues, difficulty maintainning Righting Reactions: absent LEs  Balance Balance Balance Assessed: Yes Static Sitting Balance Static Sitting - Balance Support: Feet supported;No upper extremity supported Static Sitting - Level of Assistance: 4: Min assist Dynamic Sitting Balance Dynamic Sitting - Level of Assistance: 4: Min assist;3: Mod assist Sitting balance - Comments: able to static sit w/hands in lap w/cga, mild sway.  able to sit w/single UE support, briefly without UE support.  poor dynamic sitting balance Extremity/Trunk Assessment RUE Assessment RUE Assessment: Within Functional Limits LUE Assessment LUE Assessment: Within Functional Limits     Refer to Care Plan for Long Term Goals  Recommendations for other services: Neuropsych and Therapeutic Recreation  Outing/community reintegration   Discharge Criteria: Patient will be discharged from OT if patient refuses treatment 3 consecutive times without medical reason, if treatment goals not met, if there is a change in medical status, if patient makes no progress towards goals or if patient is discharged from hospital.  The above assessment, treatment plan, treatment alternatives and goals were discussed and mutually agreed upon: by patient  Nicoletta Ba 07/08/2019, 12:48 PM

## 2019-07-09 ENCOUNTER — Inpatient Hospital Stay (HOSPITAL_COMMUNITY): Payer: No Typology Code available for payment source

## 2019-07-09 ENCOUNTER — Inpatient Hospital Stay (HOSPITAL_COMMUNITY): Payer: No Typology Code available for payment source | Admitting: Physical Therapy

## 2019-07-09 ENCOUNTER — Inpatient Hospital Stay (HOSPITAL_COMMUNITY): Payer: Medicare Other

## 2019-07-09 LAB — GLUCOSE, CAPILLARY
Glucose-Capillary: 176 mg/dL — ABNORMAL HIGH (ref 70–99)
Glucose-Capillary: 252 mg/dL — ABNORMAL HIGH (ref 70–99)
Glucose-Capillary: 170 mg/dL — ABNORMAL HIGH (ref 70–99)
Glucose-Capillary: 214 mg/dL — ABNORMAL HIGH (ref 70–99)

## 2019-07-09 LAB — URINALYSIS, ROUTINE W REFLEX MICROSCOPIC
Bilirubin Urine: NEGATIVE
Glucose, UA: NEGATIVE mg/dL
Ketones, ur: NEGATIVE mg/dL
Nitrite: NEGATIVE
Protein, ur: NEGATIVE mg/dL
RBC / HPF: >50 RBC/hpf — ABNORMAL HIGH (ref 0–5)
Specific Gravity, Urine: 1.017 (ref 1.005–1.030)
WBC, UA: >50 WBC/hpf — ABNORMAL HIGH (ref 0–5)
pH: 5 (ref 5.0–8.0)

## 2019-07-09 IMAGING — CR DG CHEST 2V
2 series · 2 of 2 positions shown · non-contrast
Comparison: [DATE]

CLINICAL DATA: Fever

EXAM:
CHEST - 2 VIEW

[chest ap]
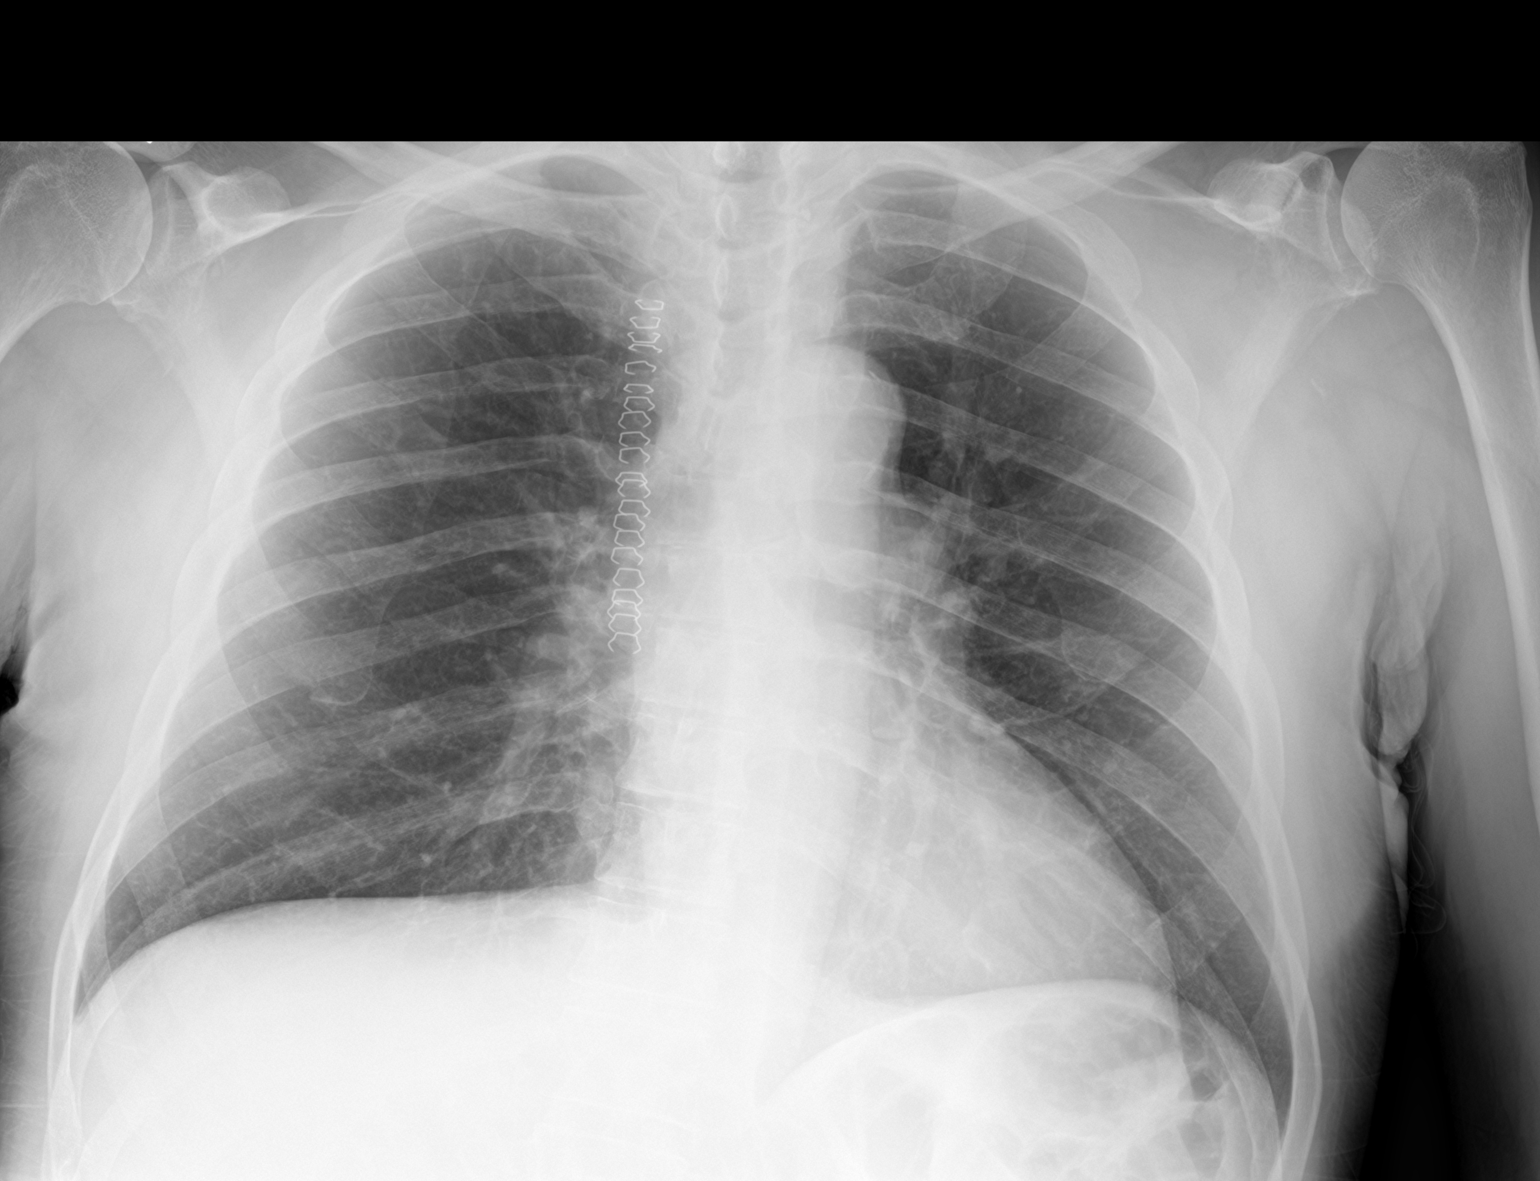

[chest lat]
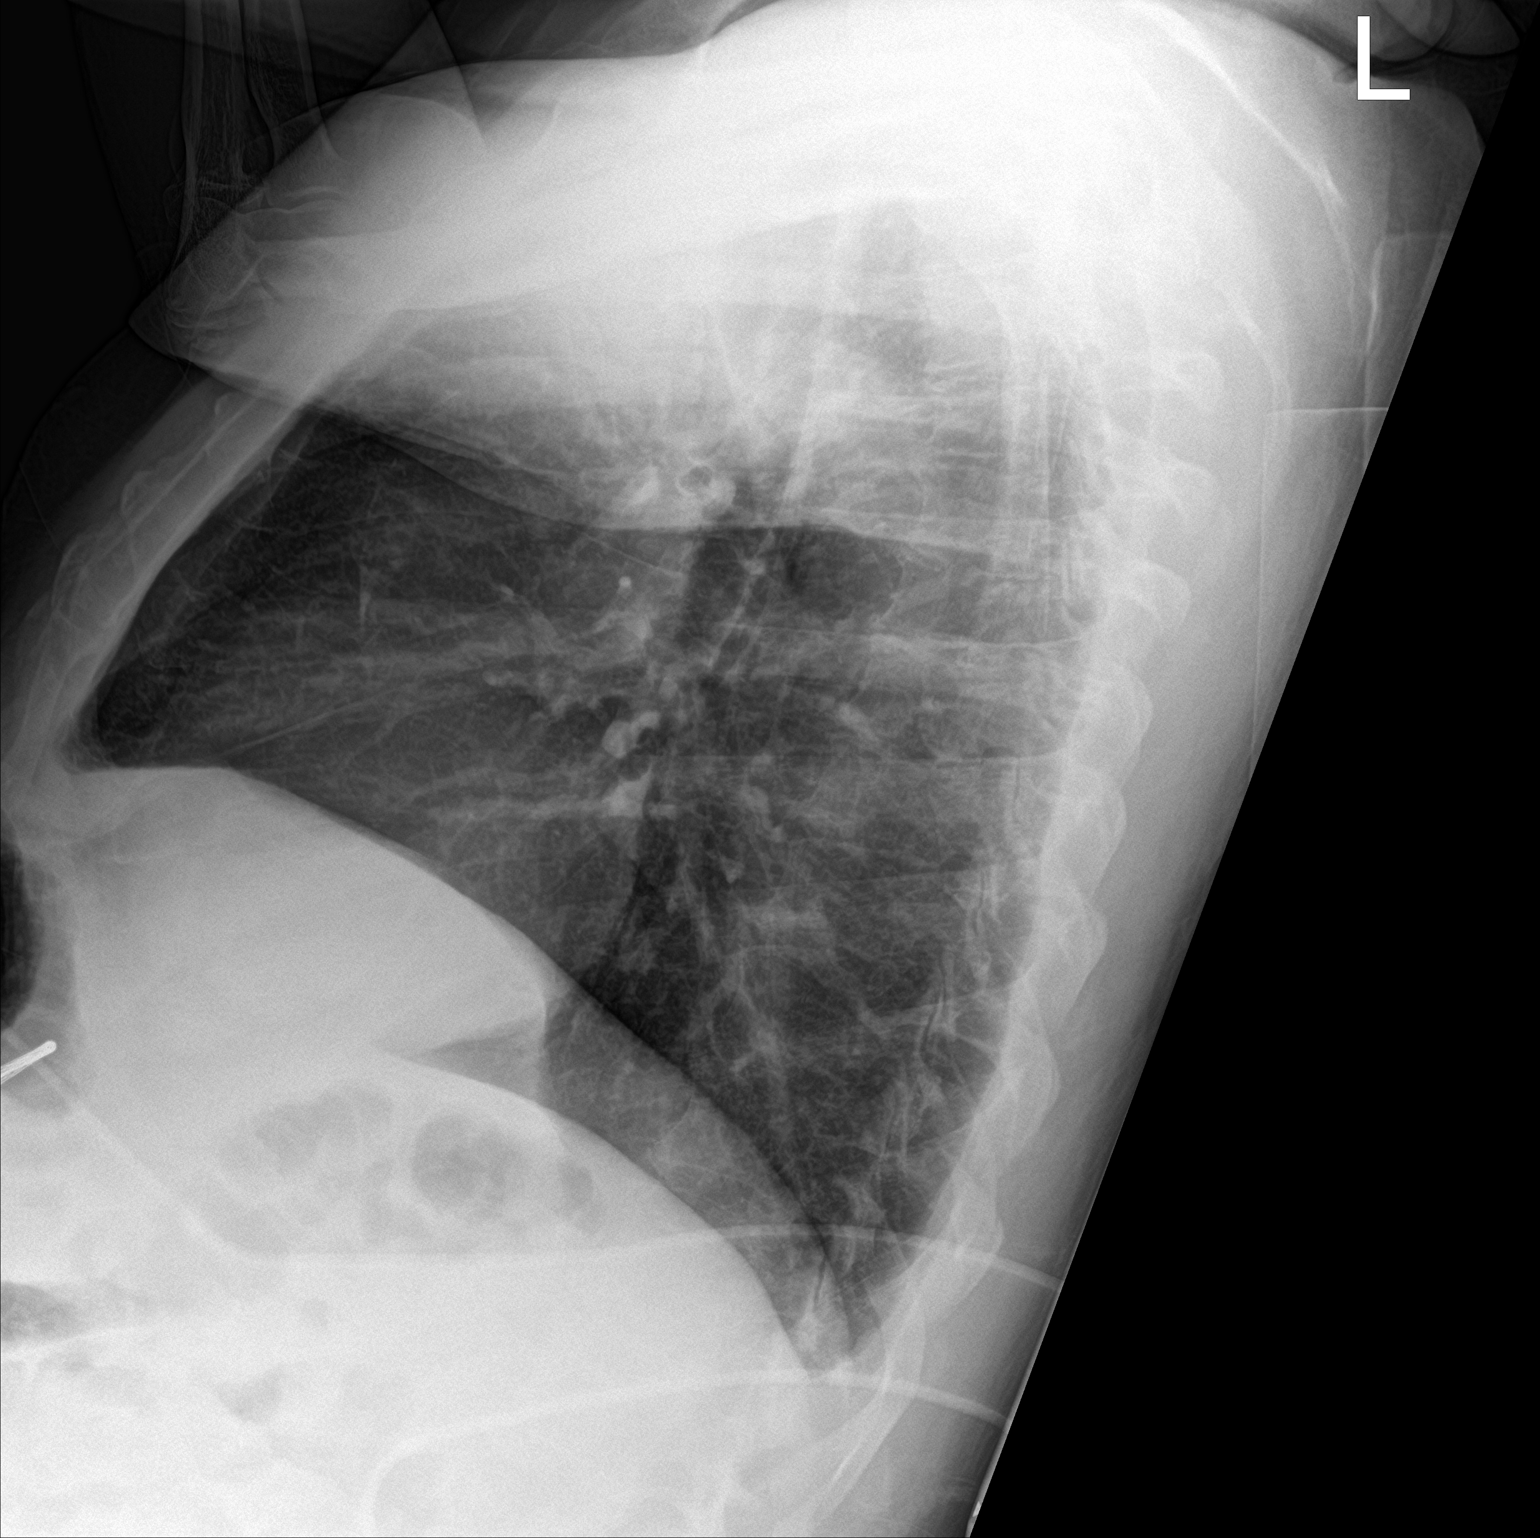

[2 of 2 positions shown; findings below may reference images not displayed]

FINDINGS: The heart size and mediastinal contours are within normal limits.
Both lungs are clear. The visualized skeletal structures are
unremarkable.
IMPRESSION: Clear lungs.

## 2019-07-09 MED ORDER — ENOXAPARIN SODIUM 40 MG/0.4ML ~~LOC~~ SOLN
40.0000 mg | Freq: Every day | SUBCUTANEOUS | Status: DC
  Administered 2019-07-09 – 2019-07-20 (×12): 40 mg via SUBCUTANEOUS
  Filled 2019-07-09 (×12): qty 0.4

## 2019-07-09 NOTE — Progress Notes (Signed)
Occupational Therapy Note  Patient Details  Name: Timothy Davenport MRN: [id number] Date of Birth: April 30, 1950  Today's Date: 07/09/2019 OT Missed Time: 19 Minutes Missed Time Reason: Pain  Pt missed 45 mins skilled OT services.  Pt c/o L chest and abdominal pain.  Pt commented this was side he fell on at home before coming into hospital. RN notified and attended to pt. BP 115/69 HR 110. Pt remained in bed with all needs within reach and bed alarm activated.    Leotis Shames [facility] 07/09/2019, 2:13 PM

## 2019-07-09 NOTE — Plan of Care (Signed)
  Problem: Consults Goal: RH SPINAL CORD INJURY PATIENT EDUCATION Description:  See Patient Education module for education specifics.  Outcome: Progressing   Problem: Consults Goal: Skin Care Protocol Initiated - if Braden Score 18 or less Description: If consults are not indicated, leave blank or document N/A Outcome: Progressing Goal: Diabetes Guidelines if Diabetic/Glucose > 140 Description: If diabetic or lab glucose is > 140 mg/dl - Initiate Diabetes/Hyperglycemia Guidelines & Document Interventions  Outcome: Progressing   Problem: SCI BOWEL ELIMINATION Goal: RH STG MANAGE BOWEL WITH ASSISTANCE Description: STG Manage Bowel with mod Assistance. Outcome: Progressing Goal: RH STG SCI MANAGE BOWEL WITH MEDICATION WITH ASSISTANCE Description: STG SCI Manage bowel with medication with min assistance. Outcome: Progressing Goal: RH STG SCI MANAGE BOWEL PROGRAM W/ASSIST OR AS APPROPRIATE Description: STG SCI Manage bowel program with mod assist or as appropriate. Outcome: Progressing   Problem: SCI BLADDER ELIMINATION Goal: RH STG MANAGE BLADDER WITH ASSISTANCE Description: STG Manage Bladder independently. Outcome: Progressing Goal: RH STG MANAGE BLADDER WITH MEDICATION WITH ASSISTANCE Description: STG Manage Bladder With Medication With Supervision. Outcome: Progressing   Problem: RH SKIN INTEGRITY Goal: RH STG SKIN FREE OF INFECTION/BREAKDOWN Description: Skin to remain free from infection and breakdown while on rehab with min assist from staff. Outcome: Progressing Goal: RH STG MAINTAIN SKIN INTEGRITY WITH ASSISTANCE Description: STG Maintain Skin Integrity Mod I. Outcome: Progressing Goal: RH STG ABLE TO PERFORM INCISION/WOUND CARE W/ASSISTANCE Description: STG Able To Perform Incision/Wound Care Mod I. Outcome: Progressing   Problem: RH SAFETY Goal: RH STG ADHERE TO SAFETY PRECAUTIONS W/ASSISTANCE/DEVICE Description: STG Adhere to Safety Precautions With min Assistance  and appropriate assistive equipment. Outcome: Progressing Goal: RH STG DECREASED RISK OF FALL WITH ASSISTANCE Description: STG Decreased Risk of Fall With min Assistance. Outcome: Progressing   Problem: RH PAIN MANAGEMENT Goal: RH STG PAIN MANAGED AT OR BELOW PT'S PAIN GOAL Description: <4 on a 0-10 pain scale. Outcome: Progressing   Problem: RH KNOWLEDGE DEFICIT SCI Goal: RH STG INCREASE KNOWLEDGE OF SELF CARE AFTER SCI Description: Patient and caregiver will demonstrate knowledge of medication management, bowel/bladder program, safety precautions, and follow up care with the MD post discharge with min assist from [place] staff. Outcome: Progressing

## 2019-07-09 NOTE — Progress Notes (Signed)
Inpatient Rehabilitation Care Coordinator Assessment and Plan Inpatient Rehabilitation Care Coordinator Assessment and Plan  Patient Details  Name: Timothy Davenport MRN: [id number] Date of Birth: 1950-12-03  Today's Date: 07/09/2019  Problem List:  Patient Active Problem List   Diagnosis Date Noted  . Paraplegia ([place]) 07/07/2019  . Neurogenic bowel 07/07/2019  . Neurogenic bladder 07/07/2019  . 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 Colon polyp   . Diabetes mellitus without complication ([place])   . Diabetic neuropathy ([place])   . HTN (hypertension)   . Patella fracture   . Renal calculi    Past Surgical History:  Past Surgical History:  Procedure Laterality Date  . LUMBAR LAMINECTOMY/DECOMPRESSION MICRODISCECTOMY N/A 07/01/2019   Procedure: LUMBAR LAMINECTOMY/DECOMPRESSION MICRODISCECTOMY 1 LEVEL, THORACIC SIX-SEVEN;  Surgeon: Consuella Lose, MD;  Location: [facility];  Service: Neurosurgery;  Laterality: N/A;  LUMBAR LAMINECTOMY/DECOMPRESSION MICRODISCECTOMY 1 LEVEL, THORACIC SIX-SEVEN   . ORIF PATELLA    . THORACIC DISCECTOMY N/A 07/03/2019   Procedure: Evacuation of Thoracic Hematoma;  Surgeon: Consuella Lose, MD;  Location: [facility];  Service: Neurosurgery;  Laterality: N/A;   Social History:  reports that he has never smoked. He has never used smokeless tobacco. He reports current alcohol use. He reports current drug use. Drug: Marijuana.  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 Education: high schol grad Read: Yes Write: Yes Employment Status: Retired Date Retired/Disabled/Unemployed: 2013 Age Retired: 61 Public relations account executive Issues: Denies Guardian/Conservator: N/A   Abuse/Neglect Abuse/Neglect Assessment Can Be Completed: Yes Physical Abuse: Denies Verbal Abuse: Denies Sexual Abuse: Denies Exploitation of patient/patient's resources: Denies Self-Neglect: Denies  Emotional Status Pt's affect, behavior and adjustment status: Pt in good spirits Recent Psychosocial Issues: Adjusting to current condition and loss of independence Psychiatric History: Denies Substance Abuse History: Pt admits to [facility] 2-3xs per week and marijuana use on occassion  Patient / Family Perceptions, Expectations & Goals Pt/Family understanding of illness & functional limitations: Pt and family have a general understanding of condition Premorbid pt/family roles/activities: Independent Anticipated changes in roles/activities/participation: Assistance with ADLs/IADLs  Education officer, environmental Agencies: None Premorbid Home Care/DME Agencies: None Transportation available at discharge: Family Resource referrals recommended: Neuropsychology  Discharge Planning Living Arrangements: Spouse/significant other Support Systems: Spouse/significant other, Children Type of Residence: Private residence Insurance Resources: Multimedia programmer (specify)([place]) Financial Resources: Radio broadcast assistant Screen Referred: No Living Expenses: Own Money Management: Patient, Spouse Does the patient have any problems obtaining your medications?: No Care Coordinator Anticipated Follow Up Needs: HH/OP  Clinical Impression SW met with pt and pt son Clifton James in room to introduce self, explain role, and discuss discharge process. Pt does not have a HCPOA but would like HCPOA education. 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 YQM:VHQI, RW, w/c (came from [place]). Pt states his  wife typically assists with transportation but she has hip surgery 3-4 months ago. Reports his children will assist. Pt gets medications through [place] mail order.   Rana Snare 07/09/2019, 3:36 PM

## 2019-07-09 NOTE — Progress Notes (Signed)
[facility] PHYSICAL MEDICINE & REHABILITATION PROGRESS NOTE   Subjective/Complaints:   Pt reports had poor results with bowel program last night- per nursing notes, had 2 Small BMs afterwards, during O/N.   Had a shower and feels great- denies any pain currently.   Occ spasms- not painful   Wants to get foley out and learn in/out caths.    ROS:  Pt denies SOB, abd pain, CP, N/V/C/D, and vision changes   Objective:   DG Abd Portable 1V  Result Date: 07/07/2019 CLINICAL DATA:  Clinical constipation. EXAM: PORTABLE ABDOMEN - 1 VIEW COMPARISON:  None. FINDINGS: Supine abdomen shows no gaseous small bowel or colonic distention. Small to moderate stool volume throughout the colon. Visualized bony anatomy unremarkable. IMPRESSION: Small to moderate stool volume. Electronically Signed   By: Misty Stanley M.D.   On: 07/07/2019 17:52   VAS [place] LOWER EXTREMITY VENOUS (DVT)  Result Date: 07/07/2019  Lower Venous DVTStudy Indications: Edema, and BLE PLEGIA.  Comparison Study: none Performing Technologist: June Leap RDMS, RVT  Examination Guidelines: A complete evaluation includes B-mode imaging, spectral Doppler, color Doppler, and power Doppler as needed of all accessible portions of each vessel. Bilateral testing is considered an integral part of a complete examination. Limited examinations for reoccurring indications may be performed as noted. The reflux portion of the exam is performed with the patient in reverse Trendelenburg.  +---------+---------------+---------+-----------+----------+--------------+ RIGHT    CompressibilityPhasicitySpontaneityPropertiesThrombus Aging +---------+---------------+---------+-----------+----------+--------------+ CFV      Full           Yes      Yes                                 +---------+---------------+---------+-----------+----------+--------------+ SFJ      Full                                                         +---------+---------------+---------+-----------+----------+--------------+ FV Prox  Full                                                        +---------+---------------+---------+-----------+----------+--------------+ FV Mid   Full                                                        +---------+---------------+---------+-----------+----------+--------------+ FV DistalFull                                                        +---------+---------------+---------+-----------+----------+--------------+ PFV      Full                                                        +---------+---------------+---------+-----------+----------+--------------+  POP      Full           Yes      Yes                                 +---------+---------------+---------+-----------+----------+--------------+ PTV      Full                                                        +---------+---------------+---------+-----------+----------+--------------+ PERO     Full                                                        +---------+---------------+---------+-----------+----------+--------------+   +---------+---------------+---------+-----------+----------+--------------+ LEFT     CompressibilityPhasicitySpontaneityPropertiesThrombus Aging +---------+---------------+---------+-----------+----------+--------------+ CFV      Full           Yes      Yes                                 +---------+---------------+---------+-----------+----------+--------------+ SFJ      Full                                                        +---------+---------------+---------+-----------+----------+--------------+ FV Prox  Full                                                        +---------+---------------+---------+-----------+----------+--------------+ FV Mid   Full                                                         +---------+---------------+---------+-----------+----------+--------------+ FV DistalFull                                                        +---------+---------------+---------+-----------+----------+--------------+ PFV      Full                                                        +---------+---------------+---------+-----------+----------+--------------+ POP      Full           Yes      Yes                                 +---------+---------------+---------+-----------+----------+--------------+  PTV      Full                                                        +---------+---------------+---------+-----------+----------+--------------+ PERO     Full                                                        +---------+---------------+---------+-----------+----------+--------------+     Summary: BILATERAL: - No evidence of deep vein thrombosis seen in the lower extremities, bilaterally. -No evidence of popliteal cyst, bilaterally.   *See table(s) above for measurements and observations. Electronically signed by Deitra Mayo MD on 07/07/2019 at 12:55:29 PM.    Final    Recent Labs    07/07/19 1832  WBC 13.2*  HGB 11.4*  HCT 34.5*  PLT 141*   Recent Labs    07/07/19 1832 07/08/19 0640  NA 134* 135  K 3.6 4.2  CL 99 99  CO2 29 27  GLUCOSE 119* 166*  BUN 26* 24*  CREATININE 1.13 1.10  CALCIUM 9.3 9.5    Intake/Output Summary (Last 24 hours) at 07/09/2019 0921 Last data filed at 07/09/2019 0730 Gross per 24 hour  Intake 1020 ml  Output 1751 ml  Net -731 ml     Physical Exam: Vital Signs Blood pressure 114/80, pulse 72, temperature 99.5 F (37.5 C), temperature source Oral, resp. rate 17, height 5\' 10"  (1.778 m), SpO2 100 %.  Physical Exam  Constitutional: awake, sitting up in bed; appropriate, NAD HENT: conjugate gaze R eye additional skin tag- medial lower eye  Cardiovascular: RRR Respiratory: CTA B/L GI: Soft, NT, ND, (+)BS    Musculoskeletal:        General: No edema.     Comments: UEs deltoids, biceps, triceps, WE, grip and finger abd 5/5 B/L 0/5 in HF, KE, KF, DF and PF B/L  Neurological: He is alert and oriented to person, place, and time.  Mildly decreased sensation reduction from  L1 to S5 B/L- however pt said it's "mild"- can feel everything from "head to bottom" No hoffman's B/L No spasms seen on exam Skin:  IV in R antecubital fossa- no infiltration No skin breakdown on heels - backside- nurse agrees Psychiatric: appropriate  Assessment/Plan: 1. Functional deficits secondary to T8 paraplegia ASIA B which require 3+ hours per day of interdisciplinary therapy in a comprehensive inpatient rehab setting.  Physiatrist is providing close team supervision and 24 hour management of active medical problems listed below.  Physiatrist and rehab team continue to assess barriers to discharge/monitor patient progress toward functional and medical goals  Care Tool:  Bathing    Body parts bathed by patient: Right arm, Left arm, Chest, Abdomen, Front perineal area, Right upper leg, Left upper leg, Face         Bathing assist Assist Level: Maximal Assistance - Patient 24 - 49%     Upper Body Dressing/Undressing Upper body dressing Upper body dressing/undressing activity did not occur (including orthotics): N/A What is the patient wearing?: Pull over shirt    Upper body assist Assist Level: Minimal Assistance - Patient > 75%    Lower Body Dressing/Undressing Lower body dressing  What is the patient wearing?: Incontinence brief, Pants     Lower body assist Assist for lower body dressing: Maximal Assistance - Patient 25 - 49%     Toileting Toileting    Toileting assist Assist for toileting: Total Assistance - Patient < 25%     Transfers Chair/bed transfer  Transfers assist  Chair/bed transfer activity did not occur: N/A  Chair/bed transfer assist level: Moderate Assistance - Patient 50  - 74%     Locomotion Ambulation   Ambulation assist   Ambulation activity did not occur: Safety/medical concerns          Walk 10 feet activity   Assist  Walk 10 feet activity did not occur: Safety/medical concerns        Walk 50 feet activity   Assist Walk 50 feet with 2 turns activity did not occur: Safety/medical concerns         Walk 150 feet activity   Assist Walk 150 feet activity did not occur: Safety/medical concerns         Walk 10 feet on uneven surface  activity   Assist Walk 10 feet on uneven surfaces activity did not occur: Safety/medical concerns         Wheelchair     Assist Will patient use wheelchair at discharge?: Yes Type of Wheelchair: Manual    Wheelchair assist level: Supervision/Verbal cueing Max wheelchair distance: 150    Wheelchair 50 feet with 2 turns activity    Assist        Assist Level: Supervision/Verbal cueing   Wheelchair 150 feet activity     Assist      Assist Level: Supervision/Verbal cueing   Blood pressure 114/80, pulse 72, temperature 99.5 F (37.5 C), temperature source Oral, resp. rate 17, height 5\' 10"  (1.778 m), SpO2 100 %.   Medical Problem List and Plan: 1.  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              -patient may  shower             -ELOS/Goals: 3-3.5 weeks- goals Mod I to min assist 2.  Antithrombotics: -DVT/anticoagulation:  Mechanical: Sequential compression devices, below knee Bilateral lower extremities Dopplers today were negative.   -need to determine when OK to start Lovenox- due to high risk of DVT. 5/20- have called NSU to see when can start- since had hematoma             -antiplatelet therapy: N/A--has been off Plavix.  3. Pain Management: Continue gabapentin tid with oxycodone prn.  4. Mood: LCSW to follow for evaluation and support.               -antipsychotic agents: N/a 5. Neuropsych: This patient is capable of making decisions on his own behalf. 6. Skin/Wound Care: Routine pressure relief measures.  Discussed with pt in depth how necessary pressure relief every 15-20 minutes is essential to prevent pressure ulcers and morbidity/complications. 7. Fluids/Electrolytes/Nutrition: Monitor I/O. Check lytes in am.  8. Cord compression:On decadron 4 mg IV every 6 hours-->will start taper 9. HTN: Monitor BP tid--continue Norvasc, Cozaar and hydralazine.   10. T2DM: Monitor BS ac/hs. On Glucotrol daily with SSI for elevated BS- likely made worse by Decadron.    CBG (last 3)  Recent Labs    07/08/19 1639 07/08/19 2119 07/09/19 0550  GLUCAP 161* 229* 176*   5/20- BGs variable, but OK  considering on Decadron-   11. Neurogenic bladder: Continue Flomax. Discussed at length Foley vs in/out caths- has foley- asked pt and wife to discuss long term what would work best for them.  5/19- thinking about foley vs in/out caths  5/20- will remove foley and teach pt in/out caths asap per his request   12. Hematochezia with Neurogenic bowel: Last BM 5/15 with bloody streaks. Will order KUB to evaluate stool burden. Continue Senna S. Will administer Miralax and enema today. Then start on bowel program nightly after dinner.  5/19- had a very large BM with enema last night- sounds like cleaned out.    5/20- 2 small BMs last night- cont' bowel program- takes 3-6 weeks to train gut 13. CKD Stage IIIa: Will order post op labs in am to monitor lytes/renal status.  5/19- Cr 1.10 this AM 14. Leucocytosis: Question/likely due to steroids. Monitor for signs of infection. Will check labs in am.  5/19- WBC 13.2- but still on decadron- no signs of infection-will monitor  15. ABLA: Last H/H checked prior to hematoma evacuation on 05/13-->recheck post op CBC in am.  5/19- Hb stable, however platelets down to 141 - will monitor closely.   16. Hypokalemia: Last K+-3.1  --will recheck in am.  5/19- K+ 4.2- con't to monitor        LOS: 2 days A FACE TO FACE EVALUATION WAS PERFORMED  Jillianna Stanek 07/09/2019, 9:21 AM

## 2019-07-09 NOTE — Progress Notes (Signed)
Patient had low grade fever this afternoon. Does have leucocytosis likely due to steroids but will order UA/UCS as foley has been in place. Will also check CXR --patient educated on how to perform IS and importance of pulmonary hygiene at least qid. Will check follow up CBC in am.

## 2019-07-09 NOTE — Progress Notes (Signed)
Physical Therapy Session Note  Patient Details  Name: Timothy Davenport MRN: [id number] Date of Birth: 01/13/1951  Today's Date: 07/09/2019 PT Individual Time: 1500-1530 PT Individual Time Calculation (min): 30 min   Short Term Goals: Week 1:  PT Short Term Goal 1 (Week 1): Pt will perform level SBT w/cga PT Short Term Goal 2 (Week 1): Pt will be I w/pressure relief management PT Short Term Goal 3 (Week 1): Pt will maintain static sitting x 5 min w/cga on mat PT Short Term Goal 4 (Week 1): Pt will roll w/supervision and cues maintaining spinal precautions  Skilled Therapeutic Interventions/Progress Updates:   Pt received supine in bed and agreeable to PT. PT instructed pt in bed level treatment session. BLE AAROM/PROM to improve/maintain muscle length and joint mobility. Sustain stretch 2 x 1 min each muscle group: gastroc/soleus, HS, hip flexion/knee flexion. Increasing pressure to allow increased ROM throughout stretch. AAROM hip/knee flexion extension trace extension noted in the LLE against slight resistance into extensors at hip, muscle twitch noted in the R hip extensors. No active quad or HS noted. UE/trunk strengthening with 3kg medicine ball: ches press 2x12, lateral raise/lower x 10 Bil. Cues for full ROM, improved pursed lip breathing, and proper speed throughout. Pt left supine in bed with call bell in reach and all needs met.        Therapy Documentation Precautions:  Precautions Precautions: Fall, Back Precaution Booklet Issued: No Precaution Comments: spinal Restrictions Weight Bearing Restrictions: No Vital Signs: Therapy Vitals Temp: 100.3 F (37.9 C) Pulse Rate: 98 Resp: 17 BP: 106/62 Patient Position (if appropriate): Lying Oxygen Therapy SpO2: 100 % O2 Device: Room Air Pain: Pain Assessment Pain Scale: (P) 0-10 Pain Score: (P) 6  Pain Type: (P) Acute pain Pain Location: (P) Abdomen Pain Orientation: (P) Mid;Left Pain Descriptors / Indicators: (P)  Stabbing Pain Frequency: (P) Occasional Pain Onset: (P) Gradual Patients Stated Pain Goal: (P) 2 Pain Intervention(s): (P) Medication (See eMAR)    Therapy/Group: Individual Therapy  Austin E Tucker 07/09/2019, 3:42 PM  

## 2019-07-09 NOTE — Progress Notes (Signed)
Occupational Therapy Session Note  Patient Details  Name: JYMERE SQUIERS MRN: [id number] Date of Birth: 04-18-1950  Today's Date: 07/09/2019 OT Individual Time: [id number] OT Individual Time Calculation (min): 75 min    Short Term Goals: Week 1:  OT Short Term Goal 1 (Week 1): Pt will don shirt in unsupported sitting with min A OT Short Term Goal 2 (Week 1): Pt will thread brief and pants with min A OT Short Term Goal 3 (Week 1): Pt will perform slide board transfer to drop arm commode with contact guard OT Short Term Goal 4 (Week 1): Pt will perform shower transfer with LRAD with mod A  Skilled Therapeutic Interventions/Progress Updates:    Pt resting in bed upon arrival.  Pt requested shower this morning.  OT intervention with focus on SB transfers, bed mobility, sitting balance, bathing at shower level, dressing at bed level, activity tolerance, and safety awareness to increase independence with BADLs. See Care Tool for bathing/dressing assist levels.  Bed mobility with min A. Sitting balance with UE support CGA/supervision.  SB tranfsers with mod A bed <>w/c and w/c<>tub bench in tub room.  Lateral leans with CGA and min verbal cues for technique. Pt with good UB strength and able to boost in w/c for SB removal. Pt rolls in bed with min A for BLE management. Pt remained in bed with all needs within reach and bed alarm activated.   Therapy Documentation Precautions:  Precautions Precautions: Fall, Back Precaution Booklet Issued: No Precaution Comments: spinal Restrictions Weight Bearing Restrictions: No Pain:  Pt c/o 2/10 back pain; shower and repositioned   Therapy/Group: Individual Therapy  [name] 07/09/2019, 8:29 AM

## 2019-07-09 NOTE — Progress Notes (Signed)
Notified Pam, PA of patient's temperature. In/out catheterized patient to collect urine culture/urinalysis. Urine was yellow with blood colored sediment and a small blood clot at the end of cath. Bowel program started. Patient had gas and a small BM. Night shift to complete bowel program.

## 2019-07-09 NOTE — Progress Notes (Signed)
Responded to AD spiritual care consult request.and to visit with patient. Document was given to nurse due to patient care.  Will follow as needed.  Jaclynn Major, [place], [facility], Pager [phone]

## 2019-07-09 NOTE — Progress Notes (Signed)
[facility] Individual Statement of Services  Patient Name:  Timothy Davenport  Date:  07/09/2019  Welcome to the [facility].  Our goal is to provide you with an individualized program based on your diagnosis and situation, designed to meet your specific needs.  With this comprehensive rehabilitation program, you will be expected to participate in at least 3 hours of rehabilitation therapies Monday-Friday, with modified therapy programming on the weekends.  Your rehabilitation program will include the following services:  Physical Therapy (PT), Occupational Therapy (OT), 24 hour per day rehabilitation nursing, Therapeutic Recreaction (TR), Psychology, Neuropsychology, Care Coordinator, Rehabilitation Medicine, Nutrition Services, Pharmacy Services and Other  Weekly team conferences will be held on Tuesdays to discuss your progress.  Your Inpatient Rehabilitation Care Coordinator will talk with you frequently to get your input and to update you on team discussions.  Team conferences with you and your family in attendance may also be held.  Expected length of stay: 2.5-3 weeks   Overall anticipated outcome: Independent with Assistive Device  Depending on your progress and recovery, your program may change. Your Inpatient Rehabilitation Care Coordinator will coordinate services and will keep you informed of any changes. Your Inpatient Rehabilitation Care Coordinator's name and contact numbers are listed  below.  The following services may also be recommended but are not provided by the [facility] will be made to provide these services after discharge if needed.  Arrangements include referral to agencies that provide these services.  Your insurance has been verified to be:  Medicare Part A and  [place]  Your primary doctor is:  [employer]  Pertinent information will be shared with your doctor and your insurance company.  Inpatient Rehabilitation Care Coordinator:  Cathleen Corti [phone] or [phone]  Information discussed with and copy given to patient by: Rana Snare, 07/09/2019, 9:49 AM

## 2019-07-09 NOTE — Progress Notes (Signed)
Physical Therapy Session Note  Patient Details  Name: Timothy Davenport MRN: [id number] Date of Birth: Nov 14, 1950  Today's Date: 07/09/2019 PT Individual Time: 0915-1015 PT Individual Time Calculation (min): 60 min   Short Term Goals: Week 1:  PT Short Term Goal 1 (Week 1): Pt will perform level SBT w/cga PT Short Term Goal 2 (Week 1): Pt will be I w/pressure relief management PT Short Term Goal 3 (Week 1): Pt will maintain static sitting x 5 min w/cga on mat PT Short Term Goal 4 (Week 1): Pt will roll w/supervision and cues maintaining spinal precautions  Skilled Therapeutic Interventions/Progress Updates:    Pt received seated in bed, agreeable to PT session. No complaints of pain. Pt is dependent to don TEDs and shoes at bed level for time conservation. Supine to sit with max A for BLE management and some trunk control. Static sitting balance EOB with CGA. Slide board transfer bed to w/c with mod A for some weight shift and trunk control during transfer. Manual w/c propulsion 2 x 200 ft with use of BUE at Supervision level. Provided pt with w/c gloves for improved grip and skin protection during w/c mobility. Introduced leg loops to patient (only one available during session) and reviewed how to don/doff leg loop and purpose for LE mobility during transfers. Pt able to utilize leg loop to lift LLE on/off w/c leg rest and reposition LE during transfers. Slide board transfer w/c to/from mat table with mod A. Seated balance EOM with close SBA for static balance and up to mod A for dynamic balance. Pt is able to hold volleyball in BUE and perform chest press to pass ball to therapist, mod A needed to maintain sitting balance with frequent LOB to the R. Pt is able to catch himself with use of UE to recover balance and return to upright position. With use of one UE to hold ball pt has no LOB and able to maintain sitting balance with CGA. Reviewed pressure relief schedule for when pt sitting up in chair, he  demonstrates good recall of schedule and techniques. Pt requests to return to bed at end of session. Slide board transfer with mod A. Sit to supine mod A for BLE management. Pt left semi-reclined in bed with needs in reach, bed alarm in place at end of session.  Therapy Documentation Precautions:  Precautions Precautions: Fall, Back Precaution Booklet Issued: No Precaution Comments: spinal Restrictions Weight Bearing Restrictions: No    Therapy/Group: Individual Therapy   Excell Seltzer, PT, DPT  07/09/2019, 12:40 PM

## 2019-07-09 NOTE — Progress Notes (Signed)
Dig stim. Performed at [facility] with #7 stool. Small incontinent stool prior to dig.stim. At 2015, dig.stim without results. 2 small stools during night. Foley patent. Bilateral PRAFO's & SCD's in place. Patrici Ranks A

## 2019-07-09 NOTE — Progress Notes (Signed)
Patient ID: Timothy Davenport, male   DOB: [birth year], 69 y.o.   MRN: [id number]    SW spoke with Mickel Baas Lynch/RN with [facility] [phone]) to discuss pta ssigned clinic, PCP, and [facility]  PCP - Dr.Piva [phone] SW- Abundio Miu [phone] ext [phone]  SW left message for Abundio Miu to inform on pt currently in our unit, and will follow-up when d/c date is established and inform on care needs. (She is currently out of the office until 5/21; coverage partner is Lawrence)  Loralee Pacas, MSW, [place] Office: [phone] Cell: [phone] Fax: [phone]

## 2019-07-10 ENCOUNTER — Inpatient Hospital Stay (HOSPITAL_COMMUNITY): Payer: No Typology Code available for payment source | Admitting: Physical Therapy

## 2019-07-10 ENCOUNTER — Inpatient Hospital Stay (HOSPITAL_COMMUNITY): Payer: No Typology Code available for payment source

## 2019-07-10 ENCOUNTER — Encounter (HOSPITAL_COMMUNITY): Payer: No Typology Code available for payment source | Admitting: Psychology

## 2019-07-10 LAB — CBC WITH DIFFERENTIAL/PLATELET
Abs Immature Granulocytes: 0.94 10*3/uL — ABNORMAL HIGH (ref 0.00–0.07)
Basophils Absolute: 0.1 10*3/uL (ref 0.0–0.1)
Basophils Relative: 0 %
Eosinophils Absolute: 0 10*3/uL (ref 0.0–0.5)
Eosinophils Relative: 0 %
HCT: 36.1 % — ABNORMAL LOW (ref 39.0–52.0)
Hemoglobin: 11.8 g/dL — ABNORMAL LOW (ref 13.0–17.0)
Immature Granulocytes: 3 %
Lymphocytes Relative: 3 %
Lymphs Abs: 0.8 10*3/uL (ref 0.7–4.0)
MCH: 27.7 pg (ref 26.0–34.0)
MCHC: 32.7 g/dL (ref 30.0–36.0)
MCV: 84.7 fL (ref 80.0–100.0)
Monocytes Absolute: 1.6 10*3/uL — ABNORMAL HIGH (ref 0.1–1.0)
Monocytes Relative: 5 %
Neutro Abs: 27 10*3/uL — ABNORMAL HIGH (ref 1.7–7.7)
Neutrophils Relative %: 89 %
Platelets: 129 10*3/uL — ABNORMAL LOW (ref 150–400)
RBC: 4.26 MIL/uL (ref 4.22–5.81)
RDW: 15.5 % (ref 11.5–15.5)
WBC: 30.4 10*3/uL — ABNORMAL HIGH (ref 4.0–10.5)
nRBC: 0 % (ref 0.0–0.2)

## 2019-07-10 LAB — GLUCOSE, CAPILLARY
Glucose-Capillary: 182 mg/dL — ABNORMAL HIGH (ref 70–99)
Glucose-Capillary: 218 mg/dL — ABNORMAL HIGH (ref 70–99)
Glucose-Capillary: 134 mg/dL — ABNORMAL HIGH (ref 70–99)
Glucose-Capillary: 168 mg/dL — ABNORMAL HIGH (ref 70–99)

## 2019-07-10 MED ORDER — CEPHALEXIN 250 MG/5ML PO SUSR
500.0000 mg | Freq: Two times a day (BID) | ORAL | Status: DC
  Administered 2019-07-10 – 2019-07-11 (×3): 500 mg via ORAL
  Filled 2019-07-10 (×4): qty 10

## 2019-07-10 MED ORDER — SODIUM CHLORIDE 0.9 % IV SOLN
1.0000 g | Freq: Once | INTRAVENOUS | Status: AC
  Administered 2019-07-10: 1 g via INTRAVENOUS
  Filled 2019-07-10: qty 1

## 2019-07-10 MED ORDER — PANTOPRAZOLE SODIUM 40 MG PO TBEC
40.0000 mg | DELAYED_RELEASE_TABLET | Freq: Every day | ORAL | Status: DC
  Administered 2019-07-10 – 2019-07-19 (×10): 40 mg via ORAL
  Filled 2019-07-10 (×10): qty 1

## 2019-07-10 NOTE — Progress Notes (Signed)
Occupational Therapy Session Note  Patient Details  Name: Timothy Davenport MRN: [id number] Date of Birth: 1950-05-09  Today's Date: 07/10/2019 OT Individual Time: [id number] OT Individual Time Calculation (min): 75 min    Short Term Goals: Week 1:  OT Short Term Goal 1 (Week 1): Pt will don shirt in unsupported sitting with min A OT Short Term Goal 2 (Week 1): Pt will thread brief and pants with min A OT Short Term Goal 3 (Week 1): Pt will perform slide board transfer to drop arm commode with contact guard OT Short Term Goal 4 (Week 1): Pt will perform shower transfer with LRAD with mod A  Skilled Therapeutic Interventions/Progress Updates:    Pt resting in bed upon arrival.  Pt agreeable to bathing/dressing seated EOB this morning.  See Care Tool for assist levels.  Supine>sit EOB with mod A and mod verbal cues for technique.  Pt requires UE support for sitting balance.  Pt required mod A for sitting balance EOB when donning shirt. Pt used lateral leans for pulling pants over hips.  Pt required mod A for returning to sitting position after lateral leans and also required assistance pulling pants over hips. SB transfer to w/c with min A and min verbal cues for technique and safety. Pt propelled w/c to gym and transferred to mat for sitting balance activities. Pt requires mod A/max A for sitting balance without UE support. Pt performed small abdominal crunches while seated EOM. Pt practiced lateral leans seated EOM with min A. SB transfer back to w/c with min A.  Pt remained in w/c with all needs within reach and table placed in front of him.   Therapy Documentation Precautions:  Precautions Precautions: Fall, Back Precaution Booklet Issued: No Precaution Comments: spinal Restrictions Weight Bearing Restrictions: No Pain:  Pt c/o 2/10 in back; repositioned and emotional support  Therapy/Group: Individual Therapy  [name] 07/10/2019, 8:23 AM

## 2019-07-10 NOTE — Progress Notes (Signed)
Occupational Therapy Session Note  Patient Details  Name: Timothy Davenport MRN: [id number] Date of Birth: May 16, 1950  Today's Date: 07/10/2019 OT Individual Time: 1300-1340 OT Individual Time Calculation (min): 40 min    Short Term Goals: Week 1:  OT Short Term Goal 1 (Week 1): Pt will don shirt in unsupported sitting with min A OT Short Term Goal 2 (Week 1): Pt will thread brief and pants with min A OT Short Term Goal 3 (Week 1): Pt will perform slide board transfer to drop arm commode with contact guard OT Short Term Goal 4 (Week 1): Pt will perform shower transfer with LRAD with mod A  Skilled Therapeutic Interventions/Progress Updates:    Pt resting in bed upon arrival.  Supine>sit EOB with min A.  SB transfer to w/c with min A after board placed. Pt propelled w/c to gym and initally engaged in BUE therex and sitting balance in w/c for [employer] exercise-5 minsX2 level 5 sitting unsupported in w/c. Focus on increased cardio and sitting balance. Pt propelled w/c ADL apartment.  Pt educated on w/c safety in kitchen. Pt practiced retrieving items from refrigerator and lower shelves in pantry. Pt returned to room and remained in w/c with all needs within reach.   Therapy Documentation Precautions:  Precautions Precautions: Fall, Back Precaution Booklet Issued: No Precaution Comments: spinal Restrictions Weight Bearing Restrictions: No Pain:  Pt denies pain this afternoon   Therapy/Group: Individual Therapy  [name] 07/10/2019, 2:49 PM

## 2019-07-10 NOTE — Plan of Care (Signed)
Problem: RH BOWEL ELIMINATION Goal: RH STG MANAGE BOWEL WITH ASSISTANCE Description: STG Manage Bowel with min assist  07/10/2019 1418 by Amanda Cockayne, LPN Outcome: Progressing 07/10/2019 1418 by Amanda Cockayne, LPN Reactivated Goal: RH STG MANAGE BOWEL W/MEDICATION W/ASSISTANCE Description: STG Manage Bowel with Medication with min assist 07/10/2019 1418 by Amanda Cockayne, LPN Outcome: Progressing 07/10/2019 1418 by Amanda Cockayne, LPN Reactivated   Problem: RH BLADDER ELIMINATION Goal: RH STG MANAGE BLADDER WITH ASSISTANCE Description: STG Manage Bladder independently  07/10/2019 1418 by Amanda Cockayne, LPN Outcome: Progressing 07/10/2019 1418 by Amanda Cockayne, LPN Reactivated   Problem: RH SKIN INTEGRITY Goal: RH STG ABLE TO PERFORM INCISION/WOUND CARE W/ASSISTANCE Description: STG Able To Perform Incision/Wound Care With mod I 07/10/2019 1418 by Amanda Cockayne, LPN Outcome: Progressing 07/10/2019 1418 by Amanda Cockayne, LPN Reactivated   Problem: RH SAFETY Goal: RH STG ADHERE TO SAFETY PRECAUTIONS W/ASSISTANCE/DEVICE Description: STG Adhere to Safety Precautions With min assist 07/10/2019 1418 by Amanda Cockayne, LPN Outcome: Progressing 07/10/2019 1418 by Amanda Cockayne, LPN Reactivated Goal: RH STG DECREASED RISK OF FALL WITH ASSISTANCE Description: STG Decreased Risk of Fall With Assistance. 07/10/2019 1418 by Toy Cookey F, LPN Outcome: Progressing 07/10/2019 1418 by Amanda Cockayne, LPN Reactivated   Problem: RH PAIN MANAGEMENT Goal: RH STG PAIN MANAGED AT OR BELOW PT'S PAIN GOAL Description: Pain score of 4 or less 07/10/2019 1418 by Amanda Cockayne, LPN Outcome: Progressing 07/10/2019 1418 by Amanda Cockayne, LPN Reactivated   Problem: Consults Goal: RH SPINAL CORD INJURY PATIENT EDUCATION Description:  See Patient Education module for education specifics.  Outcome: Progressing   Problem: Consults Goal: Skin Care  Protocol Initiated - if Braden Score 18 or less Description: If consults are not indicated, leave blank or document N/A Outcome: Progressing Goal: Diabetes Guidelines if Diabetic/Glucose > 140 Description: If diabetic or lab glucose is > 140 mg/dl - Initiate Diabetes/Hyperglycemia Guidelines & Document Interventions  Outcome: Progressing   Problem: SCI BOWEL ELIMINATION Goal: RH STG MANAGE BOWEL WITH ASSISTANCE Description: STG Manage Bowel with mod Assistance. Outcome: Progressing Goal: RH STG SCI MANAGE BOWEL WITH MEDICATION WITH ASSISTANCE Description: STG SCI Manage bowel with medication with min assistance. Outcome: Progressing Goal: RH STG SCI MANAGE BOWEL PROGRAM W/ASSIST OR AS APPROPRIATE Description: STG SCI Manage bowel program with mod assist or as appropriate. Outcome: Progressing   Problem: SCI BLADDER ELIMINATION Goal: RH STG MANAGE BLADDER WITH ASSISTANCE Description: STG Manage Bladder independently. Outcome: Progressing Goal: RH STG MANAGE BLADDER WITH MEDICATION WITH ASSISTANCE Description: STG Manage Bladder With Medication With Supervision. Outcome: Progressing   Problem: RH SKIN INTEGRITY Goal: RH STG SKIN FREE OF INFECTION/BREAKDOWN Description: Skin to remain free from infection and breakdown while on rehab with min assist from staff. Outcome: Progressing Goal: RH STG MAINTAIN SKIN INTEGRITY WITH ASSISTANCE Description: STG Maintain Skin Integrity Mod I. Outcome: Progressing Goal: RH STG ABLE TO PERFORM INCISION/WOUND CARE W/ASSISTANCE Description: STG Able To Perform Incision/Wound Care Mod I. Outcome: Progressing   Problem: RH SAFETY Goal: RH STG ADHERE TO SAFETY PRECAUTIONS W/ASSISTANCE/DEVICE Description: STG Adhere to Safety Precautions With min Assistance and appropriate assistive equipment. Outcome: Progressing Goal: RH STG DECREASED RISK OF FALL WITH ASSISTANCE Description: STG Decreased Risk of Fall With min Assistance. Outcome: Progressing    Problem: RH PAIN MANAGEMENT Goal: RH STG PAIN MANAGED AT OR BELOW PT'S PAIN GOAL Description: <4 on a 0-10 pain scale. Outcome: Progressing   Problem: RH KNOWLEDGE DEFICIT SCI  Goal: RH STG INCREASE KNOWLEDGE OF SELF CARE AFTER SCI Description: Patient and caregiver will demonstrate knowledge of medication management, bowel/bladder program, safety precautions, and follow up care with the MD post discharge with min assist from [facility] staff. Outcome: Progressing

## 2019-07-10 NOTE — Progress Notes (Signed)
Patient had a large, loose, and incontinent stool. Patient cleaned and digital stimulation done with small amount of loose stool out. Pericare done.

## 2019-07-10 NOTE — Progress Notes (Addendum)
[facility] PHYSICAL MEDICINE & REHABILITATION PROGRESS NOTE   Subjective/Complaints:   Pt reports bad day yesterday- felt nauseated- having ACID reflux/GERD Sx's as well- denies "feeling bad" today.  Tm 100.3- Tc 99.6  ROS:  Pt denies SOB, abd pain, CP, N/V/C/D, and vision changes   Objective:   DG Chest 2 View  Result Date: 07/09/2019 CLINICAL DATA:  Fever EXAM: CHEST - 2 VIEW COMPARISON:  06/30/2019 FINDINGS: The heart size and mediastinal contours are within normal limits. Both lungs are clear. The visualized skeletal structures are unremarkable. IMPRESSION: Clear lungs. Electronically Signed   By: Ulyses Jarred M.D.   On: 07/09/2019 17:26   Recent Labs    07/07/19 1832 07/10/19 0708  WBC 13.2* 30.4*  HGB 11.4* 11.8*  HCT 34.5* 36.1*  PLT 141* 129*   Recent Labs    07/07/19 1832 07/08/19 0640  NA 134* 135  K 3.6 4.2  CL 99 99  CO2 29 27  GLUCOSE 119* 166*  BUN 26* 24*  CREATININE 1.13 1.10  CALCIUM 9.3 9.5    Intake/Output Summary (Last 24 hours) at 07/10/2019 1344 Last data filed at 07/10/2019 1300 Gross per 24 hour  Intake 1040 ml  Output 2144 ml  Net -1104 ml     Physical Exam: Vital Signs Blood pressure 120/77, pulse 75, temperature 99.6 F (37.6 C), resp. rate 20, height 5\' 10"  (1.778 m), SpO2 100 %.  Physical Exam  Constitutional: awake alert, appropriate, sitting up in bed; appears comfortable, doesn't feel hot, NAD HENT: conjugate gaze R eye additional skin tag- medial lower eye  Cardiovascular: RRR Respiratory: CTA B/L- no W/R/R GI: Soft, NT, ND, (+)BS - GERD complaints.  Musculoskeletal:        General: No edema.     Comments: UEs deltoids, biceps, triceps, WE, grip and finger abd 5/5 B/L 0/5 in HF, KE, KF, DF and PF B/L  Neurological: He is alert and oriented to person, place, and time.  Mildly decreased sensation reduction from  L1 to S5 B/L- however pt said it's "mild"- can feel everything from "head to bottom" No hoffman's B/L No  spasms seen on exam Skin:  IV in R antecubital fossa- no infiltration No skin breakdown on heels - backside- nurse agrees Psychiatric: appropriate   Assessment/Plan: 1. Functional deficits secondary to T8 paraplegia ASIA B which require 3+ hours per day of interdisciplinary therapy in a comprehensive inpatient rehab setting.  Physiatrist is providing close team supervision and 24 hour management of active medical problems listed below.  Physiatrist and rehab team continue to assess barriers to discharge/monitor patient progress toward functional and medical goals  Care Tool:  Bathing    Body parts bathed by patient: Right arm, Left arm, Chest, Abdomen, Right upper leg, Left upper leg, Face   Body parts bathed by helper: Front perineal area, Buttocks, Left lower leg, Right lower leg     Bathing assist Assist Level: Maximal Assistance - Patient 24 - 49%     Upper Body Dressing/Undressing Upper body dressing Upper body dressing/undressing activity did not occur (including orthotics): N/A What is the patient wearing?: Pull over shirt    Upper body assist Assist Level: Moderate Assistance - Patient 50 - 74%    Lower Body Dressing/Undressing Lower body dressing      What is the patient wearing?: Pants     Lower body assist Assist for lower body dressing: Maximal Assistance - Patient 25 - 49%     Toileting Toileting  Toileting assist Assist for toileting: Total Assistance - Patient < 25%     Transfers Chair/bed transfer  Transfers assist  Chair/bed transfer activity did not occur: N/A  Chair/bed transfer assist level: Moderate Assistance - Patient 50 - 74%     Locomotion Ambulation   Ambulation assist   Ambulation activity did not occur: Safety/medical concerns          Walk 10 feet activity   Assist  Walk 10 feet activity did not occur: Safety/medical concerns        Walk 50 feet activity   Assist Walk 50 feet with 2 turns activity did not  occur: Safety/medical concerns         Walk 150 feet activity   Assist Walk 150 feet activity did not occur: Safety/medical concerns         Walk 10 feet on uneven surface  activity   Assist Walk 10 feet on uneven surfaces activity did not occur: Safety/medical concerns         Wheelchair     Assist Will patient use wheelchair at discharge?: Yes Type of Wheelchair: Manual    Wheelchair assist level: Supervision/Verbal cueing Max wheelchair distance: 150    Wheelchair 50 feet with 2 turns activity    Assist        Assist Level: Supervision/Verbal cueing   Wheelchair 150 feet activity     Assist      Assist Level: Supervision/Verbal cueing   Blood pressure 120/77, pulse 75, temperature 99.6 F (37.6 C), resp. rate 20, height 5\' 10"  (1.778 m), SpO2 100 %.   Medical Problem List and Plan: 1.  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              -patient may  shower             -ELOS/Goals: 3-3.5 weeks- goals Mod I to min assist 2.  Antithrombotics: -DVT/anticoagulation:  Mechanical: Sequential compression devices, below knee Bilateral lower extremities Dopplers today were negative.   -need to determine when OK to start Lovenox- due to high risk of DVT. 5/20- have called [facility] to see when can start- since had hematoma             -antiplatelet therapy: N/A--has been off Plavix.  3. Pain Management: Continue gabapentin tid with oxycodone prn.  4. Mood: LCSW to follow for evaluation and support.              -antipsychotic agents: N/a 5. Neuropsych: This patient is capable of making decisions on his own behalf. 6. Skin/Wound Care: Routine pressure relief measures.  Discussed with pt in depth how necessary pressure relief every 15-20 minutes is essential to prevent pressure ulcers and morbidity/complications. 7. Fluids/Electrolytes/Nutrition: Monitor  I/O. Check lytes in am.  8. Cord compression:On decadron 4 mg IV every 6 hours-->will start taper 9. HTN: Monitor BP tid--continue Norvasc, Cozaar and hydralazine.   10. T2DM: Monitor BS ac/hs. On Glucotrol daily with SSI for elevated BS- likely made worse by Decadron.    CBG (last 3)  Recent Labs    07/09/19 2041 07/10/19 0606 07/10/19 1131  GLUCAP 214* 182* 218*   5/20- BGs variable, but OK considering on Decadron-   11. Neurogenic bladder: Continue Flomax. Discussed at length Foley vs in/out caths- has foley- asked pt and wife to discuss long term what would work best for them.  5/19- thinking about foley vs  in/out caths  5/20- will remove foley and teach pt in/out caths asap per his request   12. Hematochezia with Neurogenic bowel: Last BM 5/15 with bloody streaks. Will order KUB to evaluate stool burden. Continue Senna S. Will administer Miralax and enema today. Then start on bowel program nightly after dinner.  5/19- had a very large BM with enema last night- sounds like cleaned out.    5/20- 2 small BMs last night- cont' bowel program- takes 3-6 weeks to train gut 13. CKD Stage IIIa: Will order post op labs in am to monitor lytes/renal status.  5/19- Cr 1.10 this AM 14. Leucocytosis with likely UTI: Question/likely due to steroids. Monitor for signs of infection. Will check labs in am.  5/19- WBC 13.2- but still on decadron- no signs of infection-will monitor   5/21- WBC 30k!- low grade temp- Tm100.3- started on Keflex for likely UTI based on U/A results.   -will give 1 dose Rocephin considering WBC is 30k- just to make sure he's well covered. 15. ABLA: Last H/H checked prior to hematoma evacuation on 05/13-->recheck post op CBC in am.  5/19- Hb stable, however platelets down to 141 - will monitor closely.    5/21- Plts 129- down from 141k - will monitor closely 16. Hypokalemia: Last K+-3.1 --will recheck in am.  5/19- K+ 4.2- con't to monitor  17. GERD-   5/21- pt c/o Acid  reflux Sx's- will order protonix in evening.       LOS: 3 days A FACE TO FACE EVALUATION WAS PERFORMED  Synia Douglass 07/10/2019, 1:44 PM

## 2019-07-10 NOTE — Consult Note (Signed)
Neuropsychological Consultation   Patient:   Timothy Davenport   DOB:   Apr 30, 1950  MR Number:  [id number]  Location:  [facility] [place] [id number] [name] [address] Dept: [place]: [phone]           Date of Service:   [id number]  Start Time:   9 AM End Time:   10 AM  Provider/Observer:  Ilean Skill, Psy.D.       Clinical Neuropsychologist       Billing Code/Service: [id number]  Chief Complaint:    Timothy Davenport is a 69 year old male with a history of type 2 diabetes with neuropathy, recent diagnosis of TIA that is now in question, and progressive development of RLE weakness/numbness for the prior 2 months with recurrent falls.  The patient had been followed through the [facility] in [place].  The patient was admitted on 06/30/2019 after a fall with inability to move LLE and worsening of RLE weakness.  MRI brain was negative for acute changes but showed right parotid gland neoplasm.  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  The patient was evaluated by Dr. Kathyrn Sheriff and taken to the OR on 07/01/2018 for laminectomy and microdiscectomy.  Postoperative issues included hypotension and dizziness and urinary retention.  Patient has continued with paraplegia.  He does have some improvement in sensation in his lower legs but continues to have significant motor deficits in his legs.  Reason for Service:  The patient was referred for neuropsychological consultation due to coping and adjustment issues.  Below is the HPI for the current mission.  HPI: Timothy Davenport is a 69 year old male with history of T2DM with neuropathy, recent TIA who started developing RLE weakness/numbness about two months ago with recurrent falls and was admitted on 06/30/19 after a fall with inability to  move LLE and worsening of RLE weakness.  MRI brain was negative for acute changes but showed right parotid gland neoplasms.  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  He was evaluated by Dr. Kathyrn Sheriff and taken to the OR on 05/12 for T6 and T7 laminectomy and right T6 and T7 pediculectomy for microdiscectomy at T6/T7.    Postop had issues with hypotension and dizziness with presyncope as well as urinary retention.  He was started on IV Decadron as follow-up MRI showed large hematoma in laminectomy bed and paraspinal soft tissues resulting in severe cord compression in conjunction to residual small disc protrusion and cord edema from T5 to mid T7.  Due to neurological decline he was taken back to the OR on 05/14 for evacuation of hematoma and on decadron taper.   He continues to have paraplegia but reports some increase in sensation. Therapy ongoing and CIR recommended due to functional decline in ability to complete ADLs and in mobility.   Current Status:  The patient sitting in his wheelchair when I entered the room and was agreeable to talk.  The patient was well oriented with good cognition.  Expressive language was well within normal limits.  The patient was aware of his situation overall with good memory functions.  The patient denied any significant negative emotional response beyond appropriate feelings about his resulting paraplegia.  He had  some questions about how this developed and what the expectations would be going forward.  Behavioral Observation: Timothy Davenport  presents as a 69 y.o.-year-old Right African American Male who appeared his stated age. his dress was Appropriate and he was Well Groomed and his manners were Appropriate to the situation.  his participation was indicative of Appropriate and Attentive behaviors.  There were any physical disabilities noted.   he displayed an appropriate level of cooperation and motivation.     Interactions:    Active Appropriate and Attentive  Attention:   within normal limits and attention span and concentration were age appropriate  Memory:   within normal limits; recent and remote memory intact  Visuo-spatial:  within normal limits  Speech (Volume):  normal  Speech:   normal; normal  Thought Process:  Coherent and Relevant  Though Content:  WNL; not suicidal and not homicidal  Orientation:   person, place, time/date and situation  Judgment:   Good  Planning:   Good  Affect:    Appropriate  Mood:    Dysphoric  Insight:   Good  Intelligence:   normal   Medical History:   Past Medical History:  Diagnosis Date  . Colon polyp   . Diabetes mellitus without complication ([place])   . Diabetic neuropathy ([place])   . HTN (hypertension)   . Patella fracture   . Renal calculi    Psychiatric History:  No prior psychiatric history  Family Med/Psych History:  Family History  Problem Relation Age of Onset  . Hypertension Mother   . Hypertension Father     Impression/DX:  Timothy Davenport is a 69 year old male with a history of type 2 diabetes with neuropathy, recent diagnosis of TIA that is now in question, and progressive development of RLE weakness/numbness for the prior 2 months with recurrent falls.  The patient had been followed through the [facility] in [place].  The patient was admitted on 06/30/2019 after a fall with inability to move LLE and worsening of RLE weakness.  MRI brain was negative for acute changes but showed right parotid gland neoplasm.  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  The patient was evaluated by Dr. Kathyrn Sheriff and taken to the OR on 07/01/2018 for laminectomy and microdiscectomy.  Postoperative issues included hypotension and dizziness and  urinary retention.  Patient has continued with paraplegia.  He does have some improvement in sensation in his lower legs but continues to have significant motor deficits in his legs.         Electronically Signed   _______________________ Ilean Skill, Psy.D.

## 2019-07-10 NOTE — Progress Notes (Signed)
Physical Therapy Session Note  Patient Details  Name: Timothy Davenport MRN: [id number] Date of Birth: 19-Jul-1950  Today's Date: 07/10/2019 PT Individual Time: [id number] PT Individual Time Calculation (min): 30 min   Short Term Goals: Week 1:  PT Short Term Goal 1 (Week 1): Pt will perform level SBT w/cga PT Short Term Goal 2 (Week 1): Pt will be I w/pressure relief management PT Short Term Goal 3 (Week 1): Pt will maintain static sitting x 5 min w/cga on mat PT Short Term Goal 4 (Week 1): Pt will roll w/supervision and cues maintaining spinal precautions Week 2:    Week 3:     Skilled Therapeutic Interventions/Progress Updates:    PAIN denies pain this pm Pt initially oob sleeping in wc supported by pillow on tray table.  Easily awakened and agreeable to treatment.  In wc performed bilat hip ER/unilateral ring sit position to bilat hips.  wc to bed SBT w/set up assist and min assist for transfer. Sitting balance on edge of bed x 15 min including: Shifting to L due to preferred posture w/trunk shifted to R, maintaining midline and increasing thoracic extension. Achieving above posture, cocontracting transverse abd/trunk etensors while attempting to lift single UE progressing to no UE support which he was able to maintain up to 20 count. Then progressed to above adding mild cervical rotation in each direction, maintains balance w/rotation to R, increased difficulty rotating to L w/tendency to lose balance to R.  Sit to supine w/cues for sequencing and max assist for bilat LEs In supine performed bilat hamstring stretching- pt only lacks approx 20degrees from SLR bilat.  Pt then instructed w/supine on elbows progressing to long sit which are both performed w/max assist.  Pt able to scoot up in bed using rails w/supervision. Pt positioned comfortably, leg loop removed by therapist. Pt left supine w/rails up x 3, alarm set, bed in lowest position, and needs in reach.    Therapy  Documentation Precautions:  Precautions Precautions: Fall, Back Precaution Booklet Issued: No Precaution Comments: spinal Restrictions Weight Bearing Restrictions: No    Therapy/Group: Individual Therapy  Callie Fielding, [place] 07/10/2019, 3:55 PM

## 2019-07-10 NOTE — IPOC Note (Addendum)
Overall Plan of Care [facility]) Patient Details Name: Timothy Davenport MRN: [id number] DOB: 22-Oct-1950  Admitting Diagnosis: Paraplegia [facility])  Hospital Problems: Principal Problem:   Paraplegia [facility]) Active Problems:   Thoracic myelopathy   Neurogenic bowel   Neurogenic bladder     Functional Problem List: Nursing Bladder, Bowel, Endurance, Medication Management, Motor, Pain, Safety, Sensory, Skin Integrity  PT Balance, Endurance, Safety, Motor, Sensory  OT Balance, Perception, Sensory, Edema, Endurance, Motor, Pain, Skin Integrity, Safety  SLP    TR         Basic ADL's: OT Grooming, Bathing, Dressing, Toileting     Advanced  ADL's: OT Simple Meal Preparation     Transfers: PT Bed Mobility, Bed to Chair, Car, Furniture, Floor  OT Toilet, Metallurgist: PT Emergency planning/management officer, Ambulation     Additional Impairments: OT None  SLP        TR      Anticipated Outcomes Item Anticipated Outcome  Self Feeding n/a  Swallowing      Basic self-care  contact guard to mod I  Toileting  contact guard   Bathroom Transfers supervision  Bowel/Bladder  Foley due to rentention, bowel program, will manage B/B with mod assisstance  Transfers  mod I  Locomotion  independent wc skills  Communication     Cognition     Pain  C/o pain, pain less than 3, manage pain with mod assisstance  Safety/Judgment  Pt will adhere to floor safety plan, call staff for assisstance, no falls during stay on floor   Therapy Plan: PT Intensity: Minimum of 1-2 x/day ,45 to 90 minutes PT Frequency: 5 out of 7 days PT Duration Estimated Length of Stay: 14-21 days OT Intensity: Minimum of 1-2 x/day, 45 to 90 minutes OT Frequency: 5 out of 7 days OT Duration/Estimated Length of Stay: ~2.5-3 weeks     Due to the current state of emergency, patients may not be receiving their 3-hours of Medicare-mandated therapy.   Team Interventions: Nursing Interventions Patient/Family  Education, Bladder Management, Bowel Management, Disease Management/Prevention, Medication Management, Pain Management, Skin Care/Wound Management, Psychosocial Support  PT interventions Balance/vestibular training, Discharge planning, DME/adaptive equipment instruction, Functional mobility training, Pain management, Splinting/orthotics, Therapeutic Activities, UE/LE Strength taining/ROM, Disease management/prevention, Functional electrical stimulation, Neuromuscular re-education, Patient/family education, Therapeutic Exercise, UE/LE Coordination activities, Wheelchair propulsion/positioning  OT Interventions Balance/vestibular training, Discharge planning, Functional electrical stimulation, Pain management, Self Care/advanced ADL retraining, Therapeutic Activities, UE/LE Coordination activities, Disease mangement/prevention, Functional mobility training, Patient/family education, Skin care/wound managment, Therapeutic Exercise, Community reintegration, DME/adaptive equipment instruction, Neuromuscular re-education, Psychosocial support, Splinting/orthotics, UE/LE Strength taining/ROM, Wheelchair propulsion/positioning  SLP Interventions    TR Interventions    SW/CM Interventions Discharge Planning, Psychosocial Support, Patient/Family Education   Barriers to Discharge MD  Medical stability, Home enviroment access/loayout, Incontinence, Neurogenic bowel and bladder, Lack of/limited family support, Weight bearing restrictions and paraplegia with likely UTI [facility] 30k  Nursing Inaccessible home environment, Decreased caregiver support, Medical stability, Home environment access/layout, Neurogenic Bowel & Bladder    PT Inaccessible home environment, Home environment access/layout, Neurogenic Bowel & Bladder, Incontinence [facility] has been consulted to request ramp and walk in shower/renovation  OT Neurogenic Bowel & Bladder    SLP      SW       Team Discharge Planning: Destination: PT-Home ,OT- Home , SLP-   Projected Follow-up: PT-Outpatient PT, OT-  Outpatient OT, SLP-  Projected Equipment Needs: PT-To be determined, OT- To be determined, SLP-  Equipment Details:  PT-will need lightweight vs ultralightweight wc, OT-  Patient/family involved in discharge planning: PT- Patient,  OT-Patient, SLP-   MD ELOS: 2.5 to 3 weeks? Medical Rehab Prognosis:  Excellent Assessment: Pt is a 69 yr old male with hx of T8 paraplegia ASIA B, neurogenic bowel and bladder, and new UTI- started on keflex- might need IV ABX. And GERD.  Goals CGA to Mod I    See Team Conference Notes for weekly updates to the plan of care

## 2019-07-10 NOTE — Progress Notes (Signed)
Patient's bowel program has been completed. Amanda Cockayne, LPN

## 2019-07-10 NOTE — Progress Notes (Signed)
Physical Therapy Session Note  Patient Details  Name: Timothy Davenport MRN: [id number] Date of Birth: 19-Oct-1950  Today's Date: 07/10/2019 PT Individual Time: 1015-1100 PT Individual Time Calculation (min): 45 min   Short Term Goals: Week 1:  PT Short Term Goal 1 (Week 1): Pt will perform level SBT w/cga PT Short Term Goal 2 (Week 1): Pt will be I w/pressure relief management PT Short Term Goal 3 (Week 1): Pt will maintain static sitting x 5 min w/cga on mat PT Short Term Goal 4 (Week 1): Pt will roll w/supervision and cues maintaining spinal precautions  Skilled Therapeutic Interventions/Progress Updates:    Pt received seated in w/c in room, agreeable to PT session. No complaints of pain. Pt requesting to go outdoors this date. Session focus on w/c mobility in functional environments navigating on/off elevators, navigating functional spaces with obstacles such as hospital hallways and seating areas, performing w/c mobility up/down inclines and across uneven ground at Supervision level overall with cues for improved technique and body mechanics. Pt is able to propel 500 ft (+) at Supervision level. Pt requests to return to bed at end of session. Slide board transfer w/c to bed with mod A for trunk control. Sit to supine mod A for BLE management. Pt left semi-reclined in bed with needs in reach, bed alarm in place at end of session.  Therapy Documentation Precautions:  Precautions Precautions: Fall, Back Precaution Booklet Issued: No Precaution Comments: spinal Restrictions Weight Bearing Restrictions: No    Therapy/Group: Individual Therapy   Excell Seltzer, PT, DPT  07/10/2019, 12:13 PM

## 2019-07-11 LAB — CBC WITH DIFFERENTIAL/PLATELET
Abs Immature Granulocytes: 1.25 10*3/uL — ABNORMAL HIGH (ref 0.00–0.07)
Basophils Absolute: 0.1 10*3/uL (ref 0.0–0.1)
Basophils Relative: 0 %
Eosinophils Absolute: 0.1 10*3/uL (ref 0.0–0.5)
Eosinophils Relative: 0 %
HCT: 33.1 % — ABNORMAL LOW (ref 39.0–52.0)
Hemoglobin: 10.9 g/dL — ABNORMAL LOW (ref 13.0–17.0)
Immature Granulocytes: 5 %
Lymphocytes Relative: 3 %
Lymphs Abs: 0.9 10*3/uL (ref 0.7–4.0)
MCH: 27.7 pg (ref 26.0–34.0)
MCHC: 32.9 g/dL (ref 30.0–36.0)
MCV: 84 fL (ref 80.0–100.0)
Monocytes Absolute: 1.2 10*3/uL — ABNORMAL HIGH (ref 0.1–1.0)
Monocytes Relative: 4 %
Neutro Abs: 24.5 10*3/uL — ABNORMAL HIGH (ref 1.7–7.7)
Neutrophils Relative %: 88 %
Platelets: 132 10*3/uL — ABNORMAL LOW (ref 150–400)
RBC: 3.94 MIL/uL — ABNORMAL LOW (ref 4.22–5.81)
RDW: 15.2 % (ref 11.5–15.5)
WBC: 28 10*3/uL — ABNORMAL HIGH (ref 4.0–10.5)
nRBC: 0.1 % (ref 0.0–0.2)

## 2019-07-11 LAB — BASIC METABOLIC PANEL
Anion gap: 10 (ref 5–15)
BUN: 24 mg/dL — ABNORMAL HIGH (ref 8–23)
CO2: 24 mmol/L (ref 22–32)
Calcium: 9.5 mg/dL (ref 8.9–10.3)
Chloride: 101 mmol/L (ref 98–111)
Creatinine, Ser: 1.08 mg/dL (ref 0.61–1.24)
GFR calc Af Amer: >60 mL/min (ref >60)
GFR calc non Af Amer: >60 mL/min (ref >60)
Glucose, Bld: 195 mg/dL — ABNORMAL HIGH (ref 70–99)
Potassium: 4.5 mmol/L (ref 3.5–5.1)
Sodium: 135 mmol/L (ref 135–145)

## 2019-07-11 LAB — URINE CULTURE: Culture: >=100000 — AB

## 2019-07-11 LAB — GLUCOSE, CAPILLARY
Glucose-Capillary: 175 mg/dL — ABNORMAL HIGH (ref 70–99)
Glucose-Capillary: 129 mg/dL — ABNORMAL HIGH (ref 70–99)
Glucose-Capillary: 265 mg/dL — ABNORMAL HIGH (ref 70–99)
Glucose-Capillary: 194 mg/dL — ABNORMAL HIGH (ref 70–99)

## 2019-07-11 MED ORDER — SODIUM CHLORIDE 0.9 % IV SOLN
1.0000 g | Freq: Three times a day (TID) | INTRAVENOUS | Status: DC
  Administered 2019-07-11 – 2019-07-14 (×9): 1 g via INTRAVENOUS
  Filled 2019-07-11 (×11): qty 1

## 2019-07-11 MED ORDER — SODIUM CHLORIDE 0.9 % IV SOLN
1.0000 g | INTRAVENOUS | Status: DC
  Filled 2019-07-11: qty 10

## 2019-07-11 NOTE — Progress Notes (Signed)
[facility] PHYSICAL MEDICINE & REHABILITATION PROGRESS NOTE   Subjective/Complaints:   Pt reports reflux is much better- was very happy with PPI meds-  Likes it a lot.   Plans on doing in/out caths today- starting at 1-0am.  Had a good BM with bowel program las tnight- in good spirits- was unaware had UTI- understands my concerns about UTI and happy with care.    ROS:  Pt denies SOB, abd pain, CP, N/V/C/D, and vision changes  Objective:   DG Chest 2 View  Result Date: 07/09/2019 CLINICAL DATA:  Fever EXAM: CHEST - 2 VIEW COMPARISON:  06/30/2019 FINDINGS: The heart size and mediastinal contours are within normal limits. Both lungs are clear. The visualized skeletal structures are unremarkable. IMPRESSION: Clear lungs. Electronically Signed   By: Ulyses Jarred M.D.   On: 07/09/2019 17:26   Recent Labs    07/10/19 0708 07/11/19 0806  WBC 30.4* 28.0*  HGB 11.8* 10.9*  HCT 36.1* 33.1*  PLT 129* 132*   Recent Labs    07/11/19 0806  NA 135  K 4.5  CL 101  CO2 24  GLUCOSE 195*  BUN 24*  CREATININE 1.08  CALCIUM 9.5    Intake/Output Summary (Last 24 hours) at 07/11/2019 1456 Last data filed at 07/11/2019 1349 Gross per 24 hour  Intake 200 ml  Output 1750 ml  Net -1550 ml     Physical Exam: Vital Signs Blood pressure 119/74, pulse 61, temperature 98.6 F (37 C), resp. rate 18, height 5\' 10"  (1.778 m), weight 79.8 kg, SpO2 97 %.  Physical Exam  Constitutional: sitting up in bed; appropriate, afebrile, bright affect, NAD HENT: conjugate gaze R eye additional skin tag- medial lower eye  Cardiovascular: borderline bradycardia; regular rhythm Respiratory: CTA B/L- no W/R/R- good air movement GI: Soft, NT, ND, (+)BS .  Musculoskeletal:        General: No edema.     Comments: UEs deltoids, biceps, triceps, WE, grip and finger abd 5/5 B/L 0/5 in HF, KE, KF, DF and PF B/L  Neurological: Ox3.  Mildly decreased sensation reduction from  L1 to S5 B/L- however pt said  it's "mild"- can feel everything from "head to bottom" No hoffman's B/L No spasms seen on exam Skin:  IV in R antecubital fossa- no infiltration No skin breakdown on heels - backside- nurse agrees Psychiatric: bright affect   Assessment/Plan: 1. Functional deficits secondary to T8 paraplegia ASIA B which require 3+ hours per day of interdisciplinary therapy in a comprehensive inpatient rehab setting.  Physiatrist is providing close team supervision and 24 hour management of active medical problems listed below.  Physiatrist and rehab team continue to assess barriers to discharge/monitor patient progress toward functional and medical goals  Care Tool:  Bathing    Body parts bathed by patient: Right arm, Left arm, Chest, Abdomen, Right upper leg, Left upper leg, Face   Body parts bathed by helper: Front perineal area, Buttocks, Left lower leg, Right lower leg     Bathing assist Assist Level: Maximal Assistance - Patient 24 - 49%     Upper Body Dressing/Undressing Upper body dressing Upper body dressing/undressing activity did not occur (including orthotics): N/A What is the patient wearing?: Pull over shirt    Upper body assist Assist Level: Moderate Assistance - Patient 50 - 74%    Lower Body Dressing/Undressing Lower body dressing      What is the patient wearing?: Underwear/pull up     Lower body assist Assist for  lower body dressing: Maximal Assistance - Patient 25 - 49%     Toileting Toileting    Toileting assist Assist for toileting: Total Assistance - Patient < 25%     Transfers Chair/bed transfer  Transfers assist  Chair/bed transfer activity did not occur: N/A  Chair/bed transfer assist level: Minimal Assistance - Patient > 75%     Locomotion Ambulation   Ambulation assist   Ambulation activity did not occur: Safety/medical concerns          Walk 10 feet activity   Assist  Walk 10 feet activity did not occur: Safety/medical  concerns        Walk 50 feet activity   Assist Walk 50 feet with 2 turns activity did not occur: Safety/medical concerns         Walk 150 feet activity   Assist Walk 150 feet activity did not occur: Safety/medical concerns         Walk 10 feet on uneven surface  activity   Assist Walk 10 feet on uneven surfaces activity did not occur: Safety/medical concerns         Wheelchair     Assist Will patient use wheelchair at discharge?: Yes Type of Wheelchair: Manual    Wheelchair assist level: Supervision/Verbal cueing Max wheelchair distance: 150    Wheelchair 50 feet with 2 turns activity    Assist        Assist Level: Supervision/Verbal cueing   Wheelchair 150 feet activity     Assist      Assist Level: Supervision/Verbal cueing   Blood pressure 119/74, pulse 61, temperature 98.6 F (37 C), resp. rate 18, height 5\' 10"  (1.778 m), weight 79.8 kg, SpO2 97 %.   Medical Problem List and Plan: 1.  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              -patient may  shower             -ELOS/Goals: 3-3.5 weeks- goals Mod I to min assist 2.  Antithrombotics: -DVT/anticoagulation:  Mechanical: Sequential compression devices, below knee Bilateral lower extremities Dopplers today were negative.   -need to determine when OK to start Lovenox- due to high risk of DVT. 5/20- have called [facility] to see when can start- since had hematoma 5/22- hadn't heard back- will call Monday             -antiplatelet therapy: N/A--has been off Plavix.  3. Pain Management: Continue gabapentin tid with oxycodone prn.  4. Mood: LCSW to follow for evaluation and support.              -antipsychotic agents: N/a 5. Neuropsych: This patient is capable of making decisions on his own behalf. 6. Skin/Wound Care: Routine pressure relief measures.  Discussed with pt in depth how  necessary pressure relief every 15-20 minutes is essential to prevent pressure ulcers and morbidity/complications. 7. Fluids/Electrolytes/Nutrition: Monitor I/O. Check lytes in am.  8. Cord compression:On decadron 4 mg IV every 6 hours-->will start taper 9. HTN: Monitor BP tid--continue Norvasc, Cozaar and hydralazine.   10. T2DM: Monitor BS ac/hs. On Glucotrol daily with SSI for elevated BS- likely made worse by Decadron.    CBG (last 3)  Recent Labs    07/10/19 2127 07/11/19 0604 07/11/19 1136  GLUCAP 168* 175* 129*      5/22- BGs well controlled- con't meds 11. Neurogenic bladder: Continue Flomax. Discussed at  length Foley vs in/out caths- has foley- asked pt and wife to discuss long term what would work best for them.  5/19- thinking about foley vs in/out caths  5/20- will remove foley and teach pt in/out caths asap per his request    5/22- learning today how to do in/out caths- appropriate and doing well per nursing 12. Hematochezia with Neurogenic bowel: Last BM 5/15 with bloody streaks. Will order KUB to evaluate stool burden. Continue Senna S. Will administer Miralax and enema today. Then start on bowel program nightly after dinner.  5/19- had a very large BM with enema last night- sounds like cleaned out.    5/22- good BM last night with program 13. CKD Stage IIIa: Will order post op labs in am to monitor lytes/renal status.  5/19- Cr 1.10 this AM 14. Leucocytosis with likely UTI: Question/likely due to steroids. Monitor for signs of infection. Will check labs in am.  5/19- WBC 13.2- but still on decadron- no signs of infection-will monitor   5/21- WBC 30k!- low grade temp- Tm100.3- started on Keflex for likely UTI based on U/A results.   -will give 1 dose Rocephin considering WBC is 30k- just to make sure he's well covered.  5/22- Will change to Cetazidim 1G q8 hourse since WBC only slightly improved to 28k- stop Keflex- is pseudomonas- will double check with pharmacy on correct  ABX 15. ABLA: Last H/H checked prior to hematoma evacuation on 05/13-->recheck post op CBC in am.  5/19- Hb stable, however platelets down to 141 - will monitor closely.    5/21- Plts 129- down from 141k - will monitor closely 16. Hypokalemia: Last K+-3.1 --will recheck in am.  5/19- K+ 4.2- con't to monitor  17. GERD-   5/21- pt c/o Acid reflux Sx's- will order protonix in evening.   5/22- Sx's better- con't Protonix     LOS: 4 days A FACE TO FACE EVALUATION WAS PERFORMED  Damarion Mendizabal 07/11/2019, 2:56 PM

## 2019-07-11 NOTE — Progress Notes (Signed)
Patient participated in his bowel program; no result.

## 2019-07-11 NOTE — Plan of Care (Addendum)
  Problem: RH BOWEL ELIMINATION Goal: RH STG MANAGE BOWEL WITH ASSISTANCE Description: STG Manage Bowel with min assist  Outcome: Not Progressing;bowel program   Problem: RH BLADDER ELIMINATION Goal: RH STG MANAGE BLADDER WITH ASSISTANCE Description: STG Manage Bladder independently  Outcome: Not Progressing; in and out cath; Patient requested to be educated with self cath; RN/NT educated using the Cure Catheter closed system Fr. 16. Patient expressed he wants to do self cath next time.

## 2019-07-11 NOTE — Progress Notes (Signed)
This RN instructed patient how to do in and out cath. Patient expressed willingness to do it himself with supervision next time.

## 2019-07-12 ENCOUNTER — Inpatient Hospital Stay (HOSPITAL_COMMUNITY): Payer: No Typology Code available for payment source

## 2019-07-12 ENCOUNTER — Inpatient Hospital Stay (HOSPITAL_COMMUNITY): Payer: No Typology Code available for payment source | Admitting: Physical Therapy

## 2019-07-12 LAB — CBC WITH DIFFERENTIAL/PLATELET
Abs Immature Granulocytes: 0.4 10*3/uL — ABNORMAL HIGH (ref 0.00–0.07)
Basophils Absolute: 0.1 10*3/uL (ref 0.0–0.1)
Basophils Relative: 0 %
Eosinophils Absolute: 0 10*3/uL (ref 0.0–0.5)
Eosinophils Relative: 0 %
HCT: 32.5 % — ABNORMAL LOW (ref 39.0–52.0)
Hemoglobin: 11 g/dL — ABNORMAL LOW (ref 13.0–17.0)
Immature Granulocytes: 2 %
Lymphocytes Relative: 3 %
Lymphs Abs: 0.7 10*3/uL (ref 0.7–4.0)
MCH: 28.1 pg (ref 26.0–34.0)
MCHC: 33.8 g/dL (ref 30.0–36.0)
MCV: 83.1 fL (ref 80.0–100.0)
Monocytes Absolute: 0.9 10*3/uL (ref 0.1–1.0)
Monocytes Relative: 4 %
Neutro Abs: 20 10*3/uL — ABNORMAL HIGH (ref 1.7–7.7)
Neutrophils Relative %: 91 %
Platelets: 153 10*3/uL (ref 150–400)
RBC: 3.91 MIL/uL — ABNORMAL LOW (ref 4.22–5.81)
RDW: 15.2 % (ref 11.5–15.5)
WBC: 22 10*3/uL — ABNORMAL HIGH (ref 4.0–10.5)
nRBC: 0.2 % (ref 0.0–0.2)

## 2019-07-12 LAB — GLUCOSE, CAPILLARY
Glucose-Capillary: 219 mg/dL — ABNORMAL HIGH (ref 70–99)
Glucose-Capillary: 282 mg/dL — ABNORMAL HIGH (ref 70–99)
Glucose-Capillary: 88 mg/dL (ref 70–99)
Glucose-Capillary: 196 mg/dL — ABNORMAL HIGH (ref 70–99)
Glucose-Capillary: 338 mg/dL — ABNORMAL HIGH (ref 70–99)

## 2019-07-12 LAB — BASIC METABOLIC PANEL
Anion gap: 6 (ref 5–15)
BUN: 27 mg/dL — ABNORMAL HIGH (ref 8–23)
CO2: 23 mmol/L (ref 22–32)
Calcium: 9.4 mg/dL (ref 8.9–10.3)
Chloride: 102 mmol/L (ref 98–111)
Creatinine, Ser: 1.07 mg/dL (ref 0.61–1.24)
GFR calc Af Amer: >60 mL/min (ref >60)
GFR calc non Af Amer: >60 mL/min (ref >60)
Glucose, Bld: 228 mg/dL — ABNORMAL HIGH (ref 70–99)
Potassium: 4.6 mmol/L (ref 3.5–5.1)
Sodium: 131 mmol/L — ABNORMAL LOW (ref 135–145)

## 2019-07-12 NOTE — Progress Notes (Signed)
Patient had an incontinent, medium size, mushy stool. Digital stimulation performed by RN and got small amount of loose stool. Peri care done. Patient tolerated procedure.

## 2019-07-12 NOTE — Plan of Care (Signed)
  Problem: RH BOWEL ELIMINATION Goal: RH STG MANAGE BOWEL WITH ASSISTANCE Description: STG Manage Bowel with min assist  Outcome: Not Progressing; bowel program; patient participated in his bowel program yesterday   Problem: RH BLADDER ELIMINATION Goal: RH STG MANAGE BLADDER WITH ASSISTANCE Description: STG Manage Bladder independently  Outcome: Not Progressing; I and O cath; patient participated in doing own cath

## 2019-07-12 NOTE — Progress Notes (Signed)
Patient assisted positioning to L lateral side. With RN supervision, patient performed self-digital stimulation. Moderate amount of loose stool came out. Pericare rendered.

## 2019-07-12 NOTE — Progress Notes (Signed)
Patient did his own caths assisted by RN;did his own peri care as well. Patient claims he wants to to his caths so he can go home.

## 2019-07-12 NOTE — Progress Notes (Signed)
Occupational Therapy Session Note  Patient Details  Name: Timothy Davenport MRN: [id number] Date of Birth: 11/16/1950  Today's Date: 07/12/2019 OT Individual Time: 0815-0920 OT Individual Time Calculation (min): 65 min    Short Term Goals: Week 1:  OT Short Term Goal 1 (Week 1): Pt will don shirt in unsupported sitting with min A OT Short Term Goal 2 (Week 1): Pt will thread brief and pants with min A OT Short Term Goal 3 (Week 1): Pt will perform slide board transfer to drop arm commode with contact guard OT Short Term Goal 4 (Week 1): Pt will perform shower transfer with LRAD with mod A  Skilled Therapeutic Interventions/Progress Updates:    Pt received supine, agreeable to therapy with no c/o pain. Pt requesting to complete bathing/dressing from EOB. Pt required instruction and mod A overall to move BLE to EOB. Pt able to transfer EOB with min A. Pt requires heavy use of BUE and posterior pelvic sitting to maintain static sitting balance. Edu provided throughout session re compensatory strategies for balance, including propping on elbows and lateral leans. Pt required min-mod A throughout ADLs for balance support. Mod A overall for UB dressing, min A for bathing. Max A required for LB dressing, discussion of future AE use. Pt able to complete thorough lateral leans to wash buttocks with min A. Pt required total A for donning shoes and socks. Discussed completing ADLs from bed level for LB and use of rolling. 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 Discussed d/c planning with pt. Pt completed slideboard transfer with CGA once board placed. Pt completed w/c mobility at community level, going outside for BUE strengthening/endurance, as well as for education re accessibility and w/c management. Pt completed 400+ ft with intermittent rest breaks, but no physical assistance needed. Pt returned to his room  and transferred back to bed for nursing staff to complete bladder scan. Bed alarm set.   Therapy Documentation Precautions:  Precautions Precautions: Fall, Back Precaution Booklet Issued: No Precaution Comments: spinal Restrictions Weight Bearing Restrictions: No   Therapy/Group: Individual Therapy  Curtis Sites 07/12/2019, 7:38 AM

## 2019-07-12 NOTE — Progress Notes (Signed)
Patient performed in an out cath with RN supervision. Patient cleaned private part and hands prior to catheterization. Supplies provided and RN verbally guided patient on procedure. Clean technique catheterizaton utilized. Questions entertained and answered. Peri care done by patient.

## 2019-07-12 NOTE — Progress Notes (Signed)
Patient did his own digital stimulation and dulcolax suppository assisted by RN. No results.

## 2019-07-12 NOTE — Progress Notes (Signed)
[place] PHYSICAL MEDICINE & REHABILITATION PROGRESS NOTE   Subjective/Complaints:   Pt reports feels great physically- doesn't feel like has UTI/or feel ill-  Did own caths yesterday/this AM- did part of own bowel program- the suppository.  Also noted has RLE>LLE spasms occasionally.   Also tearful when discussed that a friend of family died by suicide- a veteran - sounds like this week.  Wants to do some fundraiser to get suicide awareness/fundraising done- wants to go home with fire truck following and to have a celebration.    ROS:  Pt denies SOB, abd pain, CP, N/V/C/D, and vision changes  Objective:   No results found. Recent Labs    07/11/19 0806 07/12/19 0241  WBC 28.0* 22.0*  HGB 10.9* 11.0*  HCT 33.1* 32.5*  PLT 132* 153   Recent Labs    07/11/19 0806 07/12/19 0241  NA 135 131*  K 4.5 4.6  CL 101 102  CO2 24 23  GLUCOSE 195* 228*  BUN 24* 27*  CREATININE 1.08 1.07  CALCIUM 9.5 9.4    Intake/Output Summary (Last 24 hours) at 07/12/2019 1314 Last data filed at 07/12/2019 0700 Gross per 24 hour  Intake 220 ml  Output 2200 ml  Net -1980 ml     Physical Exam: Vital Signs Blood pressure 113/77, pulse 63, temperature 98.9 F (37.2 C), resp. rate 16, height 5\' 10"  (1.778 m), weight 79.8 kg, SpO2 100 %.  Physical Exam  Constitutional: sitting EOB, but needs BOTH hands to keep balance- can NOT sit upright with 1 hand, OT at bedside, NAD HENT: conjugate gaze R eye additional skin tag- medial lower eye  Cardiovascular: borderline bradycardia- regular rhythm Respiratory: CTA B/L GI: Soft, NT, ND, (+)BS .  Musculoskeletal:        General: No edema.     Comments: UEs deltoids, biceps, triceps, WE, grip and finger abd 5/5 B/L 0/5 in HF, KE, KF, DF and PF B/L  Neurological: Ox3 Mildly decreased sensation reduction from  L1 to S5 B/L- however pt said it's "mild"- can feel everything from "head to bottom" No hoffman's B/L No spasms seen today Skin:  IV  in R antecubital fossa- no infiltration No skin breakdown on heels - backside- nurse agrees Psychiatric: tearful at times about death of friend   Assessment/Plan: 1. Functional deficits secondary to T8 paraplegia ASIA B which require 3+ hours per day of interdisciplinary therapy in a comprehensive inpatient rehab setting.  Physiatrist is providing close team supervision and 24 hour management of active medical problems listed below.  Physiatrist and rehab team continue to assess barriers to discharge/monitor patient progress toward functional and medical goals  Care Tool:  Bathing    Body parts bathed by patient: Right arm, Left arm, Chest, Abdomen, Right upper leg, Left upper leg, Face, Buttocks, Front perineal area, Left lower leg, Right lower leg   Body parts bathed by helper: Front perineal area, Buttocks, Left lower leg, Right lower leg     Bathing assist Assist Level: Moderate Assistance - Patient 50 - 74%     Upper Body Dressing/Undressing Upper body dressing Upper body dressing/undressing activity did not occur (including orthotics): N/A What is the patient wearing?: Pull over shirt    Upper body assist Assist Level: Moderate Assistance - Patient 50 - 74%    Lower Body Dressing/Undressing Lower body dressing      What is the patient wearing?: Underwear/pull up, Pants     Lower body assist Assist for lower body dressing:  Maximal Assistance - Patient 25 - 49%     Toileting Toileting    Toileting assist Assist for toileting: Total Assistance - Patient < 25%     Transfers Chair/bed transfer  Transfers assist  Chair/bed transfer activity did not occur: N/A  Chair/bed transfer assist level: Moderate Assistance - Patient 50 - 74%(min A to couch, mod A from couch to w/c 2/2 height.[name])     Locomotion Ambulation   Ambulation assist   Ambulation activity did not occur: Safety/medical concerns          Walk 10 feet activity   Assist  Walk 10 feet  activity did not occur: Safety/medical concerns        Walk 50 feet activity   Assist Walk 50 feet with 2 turns activity did not occur: Safety/medical concerns         Walk 150 feet activity   Assist Walk 150 feet activity did not occur: Safety/medical concerns         Walk 10 feet on uneven surface  activity   Assist Walk 10 feet on uneven surfaces activity did not occur: Safety/medical concerns         Wheelchair     Assist Will patient use wheelchair at discharge?: Yes Type of Wheelchair: Manual    Wheelchair assist level: Supervision/Verbal cueing Max wheelchair distance: 200'    Wheelchair 50 feet with 2 turns activity    Assist        Assist Level: Supervision/Verbal cueing   Wheelchair 150 feet activity     Assist      Assist Level: Supervision/Verbal cueing   Blood pressure 113/77, pulse 63, temperature 98.9 F (37.2 C), resp. rate 16, height 5\' 10"  (1.778 m), weight 79.8 kg, SpO2 100 %.   Medical Problem List and Plan: 1.  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              -patient may  shower             -ELOS/Goals: 3-3.5 weeks- goals Mod I to min assist  5/23- pt wants to leave, if possible, by Friday- explained likely 3 weeks from rehab admission 2.  Antithrombotics: -DVT/anticoagulation:  Mechanical: Sequential compression devices, below knee Bilateral lower extremities Dopplers today were negative.   -need to determine when OK to start Lovenox- due to high risk of DVT. 5/20- have called [facility] to see when can start- since had hematoma 5/22- hadn't heard back- will call Monday             -antiplatelet therapy: N/A--has been off Plavix.  3. Pain Management: Continue gabapentin tid with oxycodone prn.  4. Mood: LCSW to follow for evaluation and support.              -antipsychotic agents: N/a 5. Neuropsych: This patient is  capable of making decisions on his own behalf. 6. Skin/Wound Care: Routine pressure relief measures.  Discussed with pt in depth how necessary pressure relief every 15-20 minutes is essential to prevent pressure ulcers and morbidity/complications. 7. Fluids/Electrolytes/Nutrition: Monitor I/O. Check lytes in am.  8. Cord compression:On decadron 4 mg IV every 6 hours-->will start taper  5/23- on PO taper- daily as of tomorrow x 3 days 9. HTN: Monitor BP tid--continue Norvasc, Cozaar and hydralazine.   10. T2DM: Monitor BS ac/hs. On Glucotrol daily with SSI for elevated BS- likely made worse by Decadron.  CBG (last 3)  Recent Labs    07/12/19 0622 07/12/19 0752 07/12/19 1203  GLUCAP 219* 282* 88      5/23- Bgs elevated in last 24 hours-saw a lot of snack at bedside-/cookies- will ask pt to calm down on snacks 11. Neurogenic bladder: Continue Flomax. Discussed at length Foley vs in/out caths- has foley- asked pt and wife to discuss long term what would work best for them.  5/19- thinking about foley vs in/out caths  5/20- will remove foley and teach pt in/out caths asap per his request    5/22- learning today how to do in/out caths- appropriate and doing well per nursing  5/23- doing own caths x 24 hours 12. Hematochezia with Neurogenic bowel: Last BM 5/15 with bloody streaks. Will order KUB to evaluate stool burden. Continue Senna S. Will administer Miralax and enema today. Then start on bowel program nightly after dinner.  5/19- had a very large BM with enema last night- sounds like cleaned out.    5/22- good BM last night with program  5/23- placed own suppository 13. CKD Stage IIIa: Will order post op labs in am to monitor lytes/renal status.  5/19- Cr 1.10 this AM 14. Leucocytosis / UTI- pseduomonasI: Question/likely due to steroids. Monitor for signs of infection. Will check labs in am.  5/19- WBC 13.2- but still on decadron- no signs of infection-will monitor   5/21- WBC 30k!-  low grade temp- Tm100.3- started on Keflex for likely UTI based on U/A results.   -will give 1 dose Rocephin considering WBC is 30k- just to make sure he's well covered.  5/22- Will change to Cetazidim 1G q8 hourse since WBC only slightly improved to 28k- stop Keflex- is pseudomonas- will double check with pharmacy on correct ABX  5/23- On Ceftazidime- 1G q8 hours- WBC down to 22k- doing better- con't IV ABX x 7 days total 15. ABLA: Last H/H checked prior to hematoma evacuation on 05/13-->recheck post op CBC in am.  5/19- Hb stable, however platelets down to 141 - will monitor closely.    5/21- Plts 129- down from 141k - will monitor closely  5/23- recheck labs Monday- monitor platelets especially, considering had hematoma/at risk for DVT. 16. Hypokalemia: Last K+-3.1 --will recheck in am.  5/19- K+ 4.2- con't to monitor  17. GERD-   5/21- pt c/o Acid reflux Sx's- will order protonix in evening.   5/22- Sx's better- con't Protonix     LOS: 5 days A FACE TO FACE EVALUATION WAS PERFORMED  Megan Lovorn 07/12/2019, 1:14 PM

## 2019-07-12 NOTE — Progress Notes (Signed)
Physical Therapy Session Note  Patient Details  Name: DILRAJ HERIN MRN: [id number] Date of Birth: 1950-03-19  Today's Date: 07/12/2019 PT Individual Time: 1006-1102 PT Individual Time Calculation (min): 56 min   Short Term Goals: Week 1:  PT Short Term Goal 1 (Week 1): Pt will perform level SBT w/cga PT Short Term Goal 2 (Week 1): Pt will be I w/pressure relief management PT Short Term Goal 3 (Week 1): Pt will maintain static sitting x 5 min w/cga on mat PT Short Term Goal 4 (Week 1): Pt will roll w/supervision and cues maintaining spinal precautions  Skilled Therapeutic Interventions/Progress Updates:  First session: Pt presents sitting in w/c and agreeable to therapy.  Pt negotiated w/c in room and into hallway w/ supervision at least 200' per trial.  Pt educated on placement of hands/palms on wheel rail to avoid injury to fingers as pt performs w/ increased run distance.  Pt negotiated into confined space and then up/down ramped surface w/ verbal cues for safety, supervision.  Pt performed UE ergometer 2 x 5' at level 5 w/o UB support, verbal cueing for midline sitting.  Pt wheeled into ADL room and performed slide board transfer w/c <> couch.  Pt leans for slide board to be placed.  Educated on good positioning.  Pt required min A for w/c > couch 2/2 downward bias, but mod A in reverse w/ re-positioning of LEs w/ sequential move.  Pt performed obstacle course through cones of varying distances to challenge negotiation skills.  Pt returned to room and all needs in reach w/ table and pillow in front for pressure relief as needed.   2nd session:  Pt presents siting in w/c and agreeable to participate in therapy.  Pt performed static sitting in w/c x 5' w/o UE support and cueing for upright and midline posture.  Ptwheeled in room and hallways to gym w/ supervision, occasional verbal cues for posture.  Pt performed slide board transfer w/c <> mat table w/ min A w/ placement of slide board.  Pt  educated on arm rest removal.  Pt performed seated unilateral shoulder flexion w/o LOB, reaching to touch basketball hoop increasing forward lean.  Pt performed sit to long sitting w/ mod A for LEs.  Long stretches to B HS w/o c/o., 30 second hold.  Pt performed transfer onto B elbows/forearm, then to long sit w/ max to mod A and verbal cues.  Pt transferred to EOB using leg lifter on LLE and mod A.  Returned to room in w/c and transferred w/c > bed w/ slide board min A and occasional re-positioning of feet, then to long sit using leg lifter (after removing shoes) and down to elbows then supine.  Pt left in bed w/ TEDS hose applied and PRAFOs.  Bed alarm on and all needs in reach.  Nursing aware of TEDS and PRAFOs.       Therapy Documentation Precautions:  Precautions Precautions: Fall, Back Precaution Booklet Issued: No Precaution Comments: spinal Restrictions Weight Bearing Restrictions: No General:   Vital Signs:   Pain:no c/o pain      Therapy/Group: Individual Therapy  Ladoris Gene 07/12/2019, 11:12 AM

## 2019-07-13 ENCOUNTER — Inpatient Hospital Stay (HOSPITAL_COMMUNITY): Payer: No Typology Code available for payment source | Admitting: Physical Therapy

## 2019-07-13 ENCOUNTER — Inpatient Hospital Stay (HOSPITAL_COMMUNITY): Payer: No Typology Code available for payment source | Admitting: Occupational Therapy

## 2019-07-13 LAB — CBC
HCT: 36.3 % — ABNORMAL LOW (ref 39.0–52.0)
Hemoglobin: 11.9 g/dL — ABNORMAL LOW (ref 13.0–17.0)
MCH: 27.7 pg (ref 26.0–34.0)
MCHC: 32.8 g/dL (ref 30.0–36.0)
MCV: 84.6 fL (ref 80.0–100.0)
Platelets: 230 10*3/uL (ref 150–400)
RBC: 4.29 MIL/uL (ref 4.22–5.81)
RDW: 15.5 % (ref 11.5–15.5)
WBC: 17.3 10*3/uL — ABNORMAL HIGH (ref 4.0–10.5)
nRBC: 0.1 % (ref 0.0–0.2)

## 2019-07-13 LAB — BASIC METABOLIC PANEL
Anion gap: 10 (ref 5–15)
BUN: 26 mg/dL — ABNORMAL HIGH (ref 8–23)
CO2: 26 mmol/L (ref 22–32)
Calcium: 9.7 mg/dL (ref 8.9–10.3)
Chloride: 100 mmol/L (ref 98–111)
Creatinine, Ser: 1.04 mg/dL (ref 0.61–1.24)
GFR calc Af Amer: >60 mL/min (ref >60)
GFR calc non Af Amer: >60 mL/min (ref >60)
Glucose, Bld: 81 mg/dL (ref 70–99)
Potassium: 4.4 mmol/L (ref 3.5–5.1)
Sodium: 136 mmol/L (ref 135–145)

## 2019-07-13 LAB — GLUCOSE, CAPILLARY
Glucose-Capillary: 251 mg/dL — ABNORMAL HIGH (ref 70–99)
Glucose-Capillary: 101 mg/dL — ABNORMAL HIGH (ref 70–99)
Glucose-Capillary: 285 mg/dL — ABNORMAL HIGH (ref 70–99)
Glucose-Capillary: 255 mg/dL — ABNORMAL HIGH (ref 70–99)

## 2019-07-13 NOTE — Progress Notes (Signed)
Occupational Therapy Session Note  Patient Details  Name: Timothy Davenport MRN: [id number] Date of Birth: 11/12/1950  Today's Date: 07/13/2019 OT Individual Time: 0705-0800 OT Individual Time Calculation (min): 55 min    Short Term Goals: Week 1:  OT Short Term Goal 1 (Week 1): Pt will don shirt in unsupported sitting with min A OT Short Term Goal 2 (Week 1): Pt will thread brief and pants with min A OT Short Term Goal 3 (Week 1): Pt will perform slide board transfer to drop arm commode with contact guard OT Short Term Goal 4 (Week 1): Pt will perform shower transfer with LRAD with mod A  Skilled Therapeutic Interventions/Progress Updates:    Upon entering the room, pt supine in bed with no c/o pain and agreeable to OT intervention. Pt does have bag of ice on R UE forearm from IV infiltrating and fluid noted. IV team is coming to place IV in L UE if able. Pt is agreeable to self care tasks from bed level. Focus on circle sitting and long sitting during self care tasks while maintaining back precautions and increasing independence. Pt using able to pull LEs into circle sitting with min A for balance to wash B LEs and thread pants onto feet. Pt rolling L <> R with min A and use of bed rails to wash buttocks and pull pants up B hips. Pt with difficulty with task secondary to sensation loss from injury. Pt with 1 LOB laterally when sitting unsupported in long sitting requiring min A to correct. Pt donning pull over shirt with min A to pull down trunk. Pt remained in bed and therapist provided total A to don B TED hose. Call bell and all needed items within reach.   Therapy Documentation Precautions:  Precautions Precautions: Fall, Back Precaution Booklet Issued: No Precaution Comments: spinal Restrictions Weight Bearing Restrictions: No ADL: ADL Eating: (P) Independent Grooming: (P) Setup Upper Body Dressing: (P) Maximal assistance(performed with PT) Lower Body Dressing: (P)  Dependent(performed with PT) Toileting: (P) Dependent Where Assessed-Toileting: (P) Bed level Toilet Transfer: (P) Not assessed(simulated mod A - now has the right comode in his room))   Therapy/Group: Individual Therapy  Gypsy Decant 07/13/2019, 9:27 AM

## 2019-07-13 NOTE — Progress Notes (Signed)
Patient cath'd self as well as did bowel program. Patient digitally stimulated self and inserted suppository. No BM, will continue to self stimulate and monitor

## 2019-07-13 NOTE — Progress Notes (Signed)
Physical Therapy Session Note  Patient Details  Name: JEDD BURKI MRN: [id number] Date of Birth: 05-21-1950  Today's Date: 07/13/2019 PT Individual Time: 0900-1000 PT Individual Time Calculation (min): 60 min   Short Term Goals: Week 1:  PT Short Term Goal 1 (Week 1): Pt will perform level SBT w/cga PT Short Term Goal 2 (Week 1): Pt will be I w/pressure relief management PT Short Term Goal 3 (Week 1): Pt will maintain static sitting x 5 min w/cga on mat PT Short Term Goal 4 (Week 1): Pt will roll w/supervision and cues maintaining spinal precautions  Skilled Therapeutic Interventions/Progress Updates:    Pt received seated in bed, agreeable to PT session. No complaints of pain. Deatra Ina, ATP present during therapy session for custom ultra light weight manual w/c evaluation. Pt is able to engage in conversation with regards to preferred w/c frame, cushion, seat back, tires, foot rests, armrests, etc and in order to determine best fit for patient and his lifestyle and current needs. Semi-reclined in bed to sitting EOB with mod A for some LE management and trunk control. Once seated EOB pt is close SBA to CGA for static sitting balance. Slide board transfer with min A throughout session. Manual w/c propulsion 2 x 150 ft at Supervision level with use of BUE. Sitting balance EOM with close SBA to min A while reaching with alt UE outside BOS and across midline. Pt tends to lose balance laterally R>L but is able to catch himself with UE on mat table and return to midline with CGA. Pt returned to w/c with min A via slide board. Pt agreeable to stay seated in w/c at end of session, needs in reach.  Therapy Documentation Precautions:  Precautions Precautions: Fall, Back Precaution Booklet Issued: No Precaution Comments: spinal Restrictions Weight Bearing Restrictions: No    Therapy/Group: Individual Therapy   Excell Seltzer, PT, DPT  07/13/2019, 11:56 AM

## 2019-07-13 NOTE — Progress Notes (Signed)
[facility] PHYSICAL MEDICINE & REHABILITATION PROGRESS NOTE   Subjective/Complaints: Pt reports feels great physically Did own caths yesterday/this AM- did part of own bowel program- the suppository.  Also tearful when discussed that a friend of family died by suicide- a veteran. Wants to do some fundraiser to get suicide awareness/fundraising done- wants to go home with fire truck following and to have a celebration. Wants to dedicate his life to improving the lives of other veterans No complaints  ROS: Pt denies SOB, abd pain, CP, N/V/C/D, and vision changes  Objective:   No results found. Recent Labs    07/12/19 0241 07/13/19 1215  WBC 22.0* 17.3*  HGB 11.0* 11.9*  HCT 32.5* 36.3*  PLT 153 230   Recent Labs    07/12/19 0241 07/13/19 1215  NA 131* 136  K 4.6 4.4  CL 102 100  CO2 23 26  GLUCOSE 228* 81  BUN 27* 26*  CREATININE 1.07 1.04  CALCIUM 9.4 9.7    Intake/Output Summary (Last 24 hours) at 07/13/2019 1519 Last data filed at 07/13/2019 1300 Gross per 24 hour  Intake 676 ml  Output 2250 ml  Net -1574 ml     Physical Exam: Vital Signs Blood pressure 104/79, pulse 82, temperature 98.5 F (36.9 C), resp. rate 16, height 5\' 10"  (1.778 m), weight 79.8 kg, SpO2 100 %.  Physical Exam  Constitutional: sitting up in bed comfortably HENT: conjugate gaze R eye additional skin tag- medial lower eye  Cardiovascular: borderline bradycardia- regular rhythm Respiratory: CTA B/L GI: Soft, NT, ND, (+)BS .  Musculoskeletal:        General: No edema.     Comments: UEs deltoids, biceps, triceps, WE, grip and finger abd 5/5 B/L 0/5 in HF, KE, KF, DF and PF B/L  Neurological: Ox3 Mildly decreased sensation reduction from  L1 to S5 B/L- however pt said it's "mild"- can feel everything from "head to bottom" No hoffman's B/L No spasms seen today Skin:  IV in R antecubital fossa- no infiltration No skin breakdown on heels - backside- nurse agrees Psychiatric: tearful at  times about death of friend   Assessment/Plan: 1. Functional deficits secondary to T8 paraplegia ASIA B which require 3+ hours per day of interdisciplinary therapy in a comprehensive inpatient rehab setting.  Physiatrist is providing close team supervision and 24 hour management of active medical problems listed below.  Physiatrist and rehab team continue to assess barriers to discharge/monitor patient progress toward functional and medical goals  Care Tool:  Bathing    Body parts bathed by patient: Right arm, Left arm, Chest, Abdomen, Right upper leg, Left upper leg, Face, Buttocks, Front perineal area, Left lower leg, Right lower leg   Body parts bathed by helper: Front perineal area, Buttocks, Left lower leg, Right lower leg     Bathing assist Assist Level: Moderate Assistance - Patient 50 - 74%     Upper Body Dressing/Undressing Upper body dressing Upper body dressing/undressing activity did not occur (including orthotics): N/A What is the patient wearing?: Pull over shirt    Upper body assist Assist Level: Minimal Assistance - Patient > 75%    Lower Body Dressing/Undressing Lower body dressing      What is the patient wearing?: Incontinence brief, Pants     Lower body assist Assist for lower body dressing: Moderate Assistance - Patient 50 - 74%     Toileting Toileting    Toileting assist Assist for toileting: Total Assistance - Patient < 25%  Transfers Chair/bed transfer  Transfers assist  Chair/bed transfer activity did not occur: N/A  Chair/bed transfer assist level: Minimal Assistance - Patient > 75%     Locomotion Ambulation   Ambulation assist   Ambulation activity did not occur: Safety/medical concerns          Walk 10 feet activity   Assist  Walk 10 feet activity did not occur: Safety/medical concerns        Walk 50 feet activity   Assist Walk 50 feet with 2 turns activity did not occur: Safety/medical concerns          Walk 150 feet activity   Assist Walk 150 feet activity did not occur: Safety/medical concerns         Walk 10 feet on uneven surface  activity   Assist Walk 10 feet on uneven surfaces activity did not occur: Safety/medical concerns         Wheelchair     Assist Will patient use wheelchair at discharge?: Yes Type of Wheelchair: Manual    Wheelchair assist level: Supervision/Verbal cueing Max wheelchair distance: 150'    Wheelchair 50 feet with 2 turns activity    Assist        Assist Level: Supervision/Verbal cueing   Wheelchair 150 feet activity     Assist      Assist Level: Supervision/Verbal cueing   Blood pressure 104/79, pulse 82, temperature 98.5 F (36.9 C), resp. rate 16, height 5\' 10"  (1.778 m), weight 79.8 kg, SpO2 100 %.   Medical Problem List and Plan: 1.  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              -patient may  shower             -ELOS/Goals: 3-3.5 weeks- goals Mod I to min assist  5/23- pt wants to leave, if possible, by Friday- explained likely 3 weeks from rehab admission  -Continue CIR 2.  Antithrombotics: -DVT/anticoagulation:  Mechanical: Sequential compression devices, below knee Bilateral lower extremities Dopplers today were negative.   -need to determine when OK to start Lovenox- due to high risk of DVT. 5/20- have called [facility] to see when can start- since had hematoma 5/22- hadn't heard back- will call Monday 5/24: reconsulted NSGY on this issue today             -antiplatelet therapy: N/A--has been off Plavix.  3. Pain Management: Continue gabapentin tid with oxycodone prn. Well controlled 4. Mood: LCSW to follow for evaluation and support.              -antipsychotic agents: N/a 5. Neuropsych: This patient is capable of making decisions on his own behalf. 6. Skin/Wound Care: Routine pressure relief  measures.  Discussed with pt in depth how necessary pressure relief every 15-20 minutes is essential to prevent pressure ulcers and morbidity/complications. 7. Fluids/Electrolytes/Nutrition: Monitor I/O. Check lytes in am.  8. Cord compression:On decadron 4 mg IV every 6 hours-->will start taper  5/23- on PO taper- daily as of tomorrow x 3 days 9. HTN: Monitor BP tid--continue Norvasc, Cozaar and hydralazine.   10. T2DM: Monitor BS ac/hs. On Glucotrol daily with SSI for elevated BS- likely made worse by Decadron.    CBG (last 3)  Recent Labs    07/12/19 2113 07/13/19 0601 07/13/19 1211  GLUCAP 338* 251* 101*      5/23- Bgs elevated in last 24 hours-saw  a lot of snack at bedside-/cookies- will ask pt to calm down on snacks  5/24: labile, continue to monitor.  11. Neurogenic bladder: Continue Flomax. Discussed at length Foley vs in/out caths- has foley- asked pt and wife to discuss long term what would work best for them.  5/19- thinking about foley vs in/out caths  5/20- will remove foley and teach pt in/out caths asap per his request    5/22- learning today how to do in/out caths- appropriate and doing well per nursing  5/24- doing own caths x 48 hours 12. Hematochezia with Neurogenic bowel: Last BM 5/15 with bloody streaks. Will order KUB to evaluate stool burden. Continue Senna S. Will administer Miralax and enema today. Then start on bowel program nightly after dinner.  5/19- had a very large BM with enema last night- sounds like cleaned out.    5/22- good BM last night with program  5/23- placed own suppository 13. CKD Stage IIIa: Will order post op labs in am to monitor lytes/renal status.  5/19- Cr 1.10 this AM 14. Leucocytosis / UTI- pseduomonasI: Question/likely due to steroids. Monitor for signs of infection. Will check labs in am.  5/19- WBC 13.2- but still on decadron- no signs of infection-will monitor   5/21- WBC 30k!- low grade temp- Tm100.3- started on Keflex for likely  UTI based on U/A results.   -will give 1 dose Rocephin considering WBC is 30k- just to make sure he's well covered.  5/22- Will change to Cetazidim 1G q8 hourse since WBC only slightly improved to 28k- stop Keflex- is pseudomonas- will double check with pharmacy on correct ABX  5/23- On Ceftazidime- 1G q8 hours- WBC down to 22k- doing better- con't IV ABX x 7 days total 15. ABLA: Last H/H checked prior to hematoma evacuation on 05/13-->recheck post op CBC in am.  5/19- Hb stable, however platelets down to 141 - will monitor closely.    5/21- Plts 129- down from 141k - will monitor closely  5/23- recheck labs Monday- monitor platelets especially, considering had hematoma/at risk for DVT. 16. Hypokalemia: Last K+-3.1 --will recheck in am.  5/19- K+ 4.2- con't to monitor  17. GERD-   5/21- pt c/o Acid reflux Sx's- will order protonix in evening.   5/22- Sx's better- con't Protonix     LOS: 6 days A FACE TO FACE EVALUATION WAS PERFORMED  Timothy Davenport 07/13/2019, 3:19 PM

## 2019-07-13 NOTE — Progress Notes (Signed)
RN noted IV infiltrated when disconnecting IV tubing after administering IV antibiotics.  IV catheter removed. Pharmacy consulted and advised cold compress. Pt instructed to keep arm elevated and cold compress applied.

## 2019-07-13 NOTE — Plan of Care (Signed)
  Problem: RH BLADDER ELIMINATION Goal: RH STG MANAGE BLADDER WITH ASSISTANCE Description: STG Manage Bladder independently  Outcome: Progressing   Problem: RH SAFETY Goal: RH STG ADHERE TO SAFETY PRECAUTIONS W/ASSISTANCE/DEVICE Description: STG Adhere to Safety Precautions With min assist Outcome: Progressing Goal: RH STG DECREASED RISK OF FALL WITH ASSISTANCE Description: STG Decreased Risk of Fall With Assistance. Outcome: Progressing

## 2019-07-13 NOTE — Progress Notes (Signed)
Physical Therapy Session Note  Patient Details  Name: Timothy Davenport MRN: [id number] Date of Birth: 03/06/1950  Today's Date: 07/13/2019 PT Individual Time: 1051-1206 PT Individual Time Calculation (min): 75 min   Short Term Goals: Week 1:  PT Short Term Goal 1 (Week 1): Pt will perform level SBT w/cga PT Short Term Goal 2 (Week 1): Pt will be I w/pressure relief management PT Short Term Goal 3 (Week 1): Pt will maintain static sitting x 5 min w/cga on mat PT Short Term Goal 4 (Week 1): Pt will roll w/supervision and cues maintaining spinal precautions  Skilled Therapeutic Interventions/Progress Updates: Pt presented in w/c agreeable to therapy. Pt denies pain during session. Session focused on sitting balance and blocked practice of SB transfers from varying levels. Pt propelled to ortho gym and was able to lock breaks and remove arm rest mod I. PTA assisted with removing leg rests as pt will have utralight w/c upon d/c. Pt performed blocked practice SB transfer to/from w/c from 18in, 22in, and 24in respectively. Pt required SB set up assist for all levels and was able to remove SB with lateral lean with minA and PTA guarding pt. Performed SB from 18in and 22in with CGA overall but required initially modA for 24in with improvement to minA on repeated trials. Pt then transferred to rehab gym and transferred to mat via SB transfer level surface minA. Participated in sitting balance activities including reaching and placing clothespins on basketball net. Pt was able to maintain upright balance with minimal compensations when reaching with LUE, however noted increased rotation and requiring min to modA for stabilization when reaching with RUE. Pt then performed forward leans with return to midline x 10 with hands on legs and cues for increased recruitment of core ms. Pt able to improve forward lean with improved control with repetition. Performed lateral leans with shoulder abd on contralateral side with  4lb wt x 10 bilaterally. Pt able to perform leans to L with CGA but required minA for leans to R to return to neutral. Pt also performed static sitting with UE elevated to chest height with 3-4 sec hold x 10 with CGA overall and able to progress to alternating shoulder flexion to 90 x 30 sec with CGA. Pt transferred back to w/c via level SB transfer and CGA and propelled back to room. Pt remained in w/c at end of session with call bell within reach and needs met.      Therapy Documentation Precautions:  Precautions Precautions: Fall, Back Precaution Booklet Issued: No Precaution Comments: spinal Restrictions Weight Bearing Restrictions: No   Therapy/Group: Individual Therapy  Sonny Poth  Sanjiv Castorena, PTA  07/13/2019, 2:49 PM

## 2019-07-14 ENCOUNTER — Inpatient Hospital Stay (HOSPITAL_COMMUNITY): Payer: No Typology Code available for payment source | Admitting: Occupational Therapy

## 2019-07-14 ENCOUNTER — Inpatient Hospital Stay (HOSPITAL_COMMUNITY): Payer: No Typology Code available for payment source | Admitting: Physical Therapy

## 2019-07-14 LAB — GLUCOSE, CAPILLARY
Glucose-Capillary: 166 mg/dL — ABNORMAL HIGH (ref 70–99)
Glucose-Capillary: 165 mg/dL — ABNORMAL HIGH (ref 70–99)
Glucose-Capillary: 207 mg/dL — ABNORMAL HIGH (ref 70–99)
Glucose-Capillary: 202 mg/dL — ABNORMAL HIGH (ref 70–99)

## 2019-07-14 MED ORDER — GLIPIZIDE 5 MG PO TABS
10.0000 mg | ORAL_TABLET | Freq: Every day | ORAL | Status: DC
  Administered 2019-07-15 – 2019-07-20 (×6): 10 mg via ORAL
  Filled 2019-07-14 (×6): qty 2

## 2019-07-14 MED ORDER — SODIUM CHLORIDE 0.9 % IV SOLN
1.0000 g | Freq: Three times a day (TID) | INTRAVENOUS | Status: AC
  Administered 2019-07-14 – 2019-07-17 (×11): 1 g via INTRAVENOUS
  Filled 2019-07-14 (×16): qty 1

## 2019-07-14 MED ORDER — POLYVINYL ALCOHOL 1.4 % OP SOLN
1.0000 [drp] | Freq: Four times a day (QID) | OPHTHALMIC | Status: DC
  Administered 2019-07-15 – 2019-07-20 (×18): 1 [drp] via OPHTHALMIC
  Filled 2019-07-14: qty 15

## 2019-07-14 NOTE — Progress Notes (Signed)
Occupational Therapy Session Note  Patient Details  Name: Timothy Davenport MRN: [id number] Date of Birth: 13-Apr-1950  Today's Date: 07/14/2019 OT Individual Time: 1005-1107 OT Individual Time Calculation (min): 62 min    Short Term Goals: Week 1:  OT Short Term Goal 1 (Week 1): Pt will don shirt in unsupported sitting with min A OT Short Term Goal 2 (Week 1): Pt will thread brief and pants with min A OT Short Term Goal 3 (Week 1): Pt will perform slide board transfer to drop arm commode with contact guard OT Short Term Goal 4 (Week 1): Pt will perform shower transfer with LRAD with mod A  Skilled Therapeutic Interventions/Progress Updates:    Pt worked on bathing and dressing during session.  He was able to transfer from supine to sit with use of the bedrails for support.  He needed min assist for unsupported sitting EOB while completing UB bathing.  He needed use of his UEs for support as he would wash with the other hand.  He transferred back to supine with mod assist in order to work on washing and dressing LEs in circle sit.  He needed min assist with HOB to bring LEs up and cross over the opposite leg.  He was then able to wash his foot and donn his shorts.  He needed min assist for rolling in supine to wash his buttocks as well as for pulling his shorts over his hips.  Therapist provided total assist for donning brief.  He transferred to the wheelchair with min assist after placement of the sliding board.  Finished session with completion of oral hygiene with independence at the sink.  Pt was left up in the wheelchair in preparation for next session with PT.  Call button and phone in reach.    Therapy Documentation Precautions:  Precautions Precautions: Fall, Back Precaution Booklet Issued: No Precaution Comments: spinal Restrictions Weight Bearing Restrictions: No   Pain: Pain Assessment Pain Scale: Faces Pain Score: 0-No pain ADL: See Care Tool Section for some details of  mobility and selfcare  Therapy/Group: Individual Therapy  [name] OTR/L 07/14/2019, 12:46 PM

## 2019-07-14 NOTE — Plan of Care (Signed)
  Problem: RH BOWEL ELIMINATION Goal: RH STG MANAGE BOWEL WITH ASSISTANCE Description: STG Manage Bowel with min assist  Outcome: Progressing Goal: RH STG MANAGE BOWEL W/MEDICATION W/ASSISTANCE Description: STG Manage Bowel with Medication with min assist Outcome: Progressing   Problem: RH BLADDER ELIMINATION Goal: RH STG MANAGE BLADDER WITH ASSISTANCE Description: STG Manage Bladder independently  Outcome: Progressing   Problem: RH PAIN MANAGEMENT Goal: RH STG PAIN MANAGED AT OR BELOW PT'S PAIN GOAL Description: Pain score of 4 or less Outcome: Progressing

## 2019-07-14 NOTE — Progress Notes (Signed)
Physical Therapy Session Note  Patient Details  Name: Timothy Davenport MRN: [id number] Date of Birth: [birth year]  Today's Date: 07/14/2019 PT Individual Time: 1138-1203; [id number] PT Individual Time Calculation (min): 25 min and 40 min  Short Term Goals: Week 1:  PT Short Term Goal 1 (Week 1): Pt will perform level SBT w/cga PT Short Term Goal 2 (Week 1): Pt will be I w/pressure relief management PT Short Term Goal 3 (Week 1): Pt will maintain static sitting x 5 min w/cga on mat PT Short Term Goal 4 (Week 1): Pt will roll w/supervision and cues maintaining spinal precautions  Skilled Therapeutic Interventions/Progress Updates:    Session 1: Pt received seated in w/c in room, agreeable to PT session. No complaints of pain. Discussed team conference and possibility of d/c on 5/31. Education with patient that if plan is to d/c home on that date he will likely not reach mod I therapy goals and will likely be at min A overall for mobility and transfers requiring assist from family. Also discussed with patient that he may not have all necessary equipment needed for a safe d/c home such as a ramp that he will need for safe entry to the home. Pt reports his family can assist him in w/c up/down the step he has to enter his home until his ramp can be installed. Also discussed that pt currently requires max A for pericare following continent and incontinent BMs and that if he continues to require a significant level of assist with this his family will have to provide that level of assist. Pt aware of therapy concerns with a safe d/c home on 5/31 but motivated to continue to work towards going home on this date. Pt's family to come in later this week for hands-on training prior to planned d/c. Reviewed equipment that pt will need for a safe d/c home including a 30" slide board and a hospital bed, as well as loaner manual w/c. Pt left seated in w/c in room with needs in reach at end of session.  Session 2: Pt  received seated in w/c handed off from OT session. No complaints of pain. Pt interested in attempting methods of self-cathing in seated position vs in the hospital bed. OT provided wide drop arm bedside commode and pt agreeable to attempt transfers to [facility] this session. Slide board transfer w/c to bed with min A. Slide board transfer bed to/from wide drop arm BSC with min to mod A, increased assist needed for trunk control. Once seated on BSC pt reports feeling "unsupported" due to decreased back support on this type of commode. Lateral leans R/L at CGA level in order to doff pants. Pt then able to pull pants back up to thighs with CGA via lateral leans. Pt requires assist to pull pants up over hips via pressing up with use of BUE on armrests on BSC. Pt reports he does prefer this type of commode due to increased room to maneuver for clothing and catheter management as well as increased safety with slide board transfers to this type of commode. Pt agreeable to attempt to cath himself while seated on wide [facility] tomorrow. Pt returns to bed and is mod A for sit to supine. Supine to long-sitting with min A with HOB slightly elevated with use of BUE on bedrails. Once in long-sitting position reviewed BLE PROM and stretching program to prevent contracture. Pt left semi-reclined in bed with needs in reach, bed alarm in place at end of session.  Therapy Documentation Precautions:  Precautions Precautions: Fall, Back Precaution Booklet Issued: No Precaution Comments: spinal Restrictions Weight Bearing Restrictions: No   Therapy/Group: Individual Therapy   Excell Seltzer, PT, DPT  07/14/2019, 3:44 PM

## 2019-07-14 NOTE — Progress Notes (Signed)
Patient did bowel program, self stimulated and inserted suppository. No BM at this time

## 2019-07-14 NOTE — Progress Notes (Signed)
Patient ID: Timothy Davenport, male   DOB: Sep 07, 1950, 69 y.o.   MRN: [id number]  SW returned phone call to pt wife Timothy Davenport [phone]) to answer questions with regard to d/c planning and items that were in process prior to pt coming into hospital such as hospital bed, ramp, and bathroom modifications.   SW left message for Timothy Davenport [phone] ext [phone]) to inform on pt d/c date 5/31, and discuss above with regard to items pt states were previously in process. SW waiting on follow-up.   *SW met with pt and pt son Timothy Davenport in room to provide updates from team conference, and d.c date 5/31 @ pt request. Pt aware that he may have to use his Medicare benefits versus VA benefits as his putting care needs in place could be delayed due to [place] processes. SW to follow-up once more information.   Timothy Davenport, MSW, [place] Office: [phone] Cell: [phone] Fax: [phone]

## 2019-07-14 NOTE — Progress Notes (Addendum)
[facility] PHYSICAL MEDICINE & REHABILITATION PROGRESS NOTE   Subjective/Complaints:  Pt reports had a great day yesterday- voided on his own 300-500cc- still doing his own caths- more sensations in RLE, but also having more spasms.   Did own bowel program- last night- no results per notes- BM x2 incontinent AFTER bowel program   ROS:  Pt denies SOB, abd pain, CP, N/V/C/D, and vision changes   Objective:   No results found. Recent Labs    07/12/19 0241 07/13/19 1215  WBC 22.0* 17.3*  HGB 11.0* 11.9*  HCT 32.5* 36.3*  PLT 153 230   Recent Labs    07/12/19 0241 07/13/19 1215  NA 131* 136  K 4.6 4.4  CL 102 100  CO2 23 26  GLUCOSE 228* 81  BUN 27* 26*  CREATININE 1.07 1.04  CALCIUM 9.4 9.7    Intake/Output Summary (Last 24 hours) at 07/14/2019 1004 Last data filed at 07/14/2019 0500 Gross per 24 hour  Intake 680 ml  Output 2651 ml  Net -1971 ml     Physical Exam: Vital Signs Blood pressure 109/81, pulse 85, temperature 99.1 F (37.3 C), temperature source Oral, resp. rate 17, height 5\' 10"  (1.778 m), weight 79.8 kg, SpO2 100 %.  Physical Exam  Constitutional: sitting up in bed- c/o dry eyes that makes his eyes water, NAD HENT: conjugate gaze R eye additional skin tag- medial lower eye  Cardiovascular: RRR Respiratory: CTA B/L GI: Soft, NT, ND, (+)BS  Musculoskeletal:        General: No edema.     Comments: UEs deltoids, biceps, triceps, WE, grip and finger abd 5/5 B/L 0/5 in HF, KE, KF, DF and PF B/L  Neurological: Ox3 Mildly decreased sensation reduction from  L1 to S5 B/L- is a little better on RLE however pt said it's "mild"- can feel everything from "head to bottom" No hoffman's B/L No spasms seen today- but not moving currently Skin: intact; clean, dry Psychiatric:bright affect   Assessment/Plan: 1. Functional deficits secondary to T8 paraplegia ASIA B which require 3+ hours per day of interdisciplinary therapy in a comprehensive inpatient  rehab setting.  Physiatrist is providing close team supervision and 24 hour management of active medical problems listed below.  Physiatrist and rehab team continue to assess barriers to discharge/monitor patient progress toward functional and medical goals  Care Tool:  Bathing    Body parts bathed by patient: Right arm, Left arm, Chest, Abdomen, Right upper leg, Left upper leg, Face, Buttocks, Front perineal area, Left lower leg, Right lower leg   Body parts bathed by helper: Front perineal area, Buttocks, Left lower leg, Right lower leg     Bathing assist Assist Level: Moderate Assistance - Patient 50 - 74%     Upper Body Dressing/Undressing Upper body dressing Upper body dressing/undressing activity did not occur (including orthotics): N/A What is the patient wearing?: Pull over shirt    Upper body assist Assist Level: Minimal Assistance - Patient > 75%    Lower Body Dressing/Undressing Lower body dressing      What is the patient wearing?: Incontinence brief, Pants     Lower body assist Assist for lower body dressing: Moderate Assistance - Patient 50 - 74%     Toileting Toileting    Toileting assist Assist for toileting: Total Assistance - Patient < 25%     Transfers Chair/bed transfer  Transfers assist  Chair/bed transfer activity did not occur: N/A  Chair/bed transfer assist level: Minimal Assistance - Patient >  75%     Locomotion Ambulation   Ambulation assist   Ambulation activity did not occur: Safety/medical concerns          Walk 10 feet activity   Assist  Walk 10 feet activity did not occur: Safety/medical concerns        Walk 50 feet activity   Assist Walk 50 feet with 2 turns activity did not occur: Safety/medical concerns         Walk 150 feet activity   Assist Walk 150 feet activity did not occur: Safety/medical concerns         Walk 10 feet on uneven surface  activity   Assist Walk 10 feet on uneven surfaces  activity did not occur: Safety/medical concerns         Wheelchair     Assist Will patient use wheelchair at discharge?: Yes Type of Wheelchair: Manual    Wheelchair assist level: Supervision/Verbal cueing Max wheelchair distance: 150'    Wheelchair 50 feet with 2 turns activity    Assist        Assist Level: Supervision/Verbal cueing   Wheelchair 150 feet activity     Assist      Assist Level: Supervision/Verbal cueing   Blood pressure 109/81, pulse 85, temperature 99.1 F (37.3 C), temperature source Oral, resp. rate 17, height 5\' 10"  (1.778 m), weight 79.8 kg, SpO2 100 %.   Medical Problem List and Plan: 1.  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              -patient may  shower             -ELOS/Goals: 3-3.5 weeks- goals Mod I to min assist  5/23- pt wants to leave, if possible, by Friday- explained likely 3 weeks from rehab admission  5/25- pt really wants to leave memorial day- d/c staples  -Continue CIR 2.  Antithrombotics: -DVT/anticoagulation:  Mechanical: Sequential compression devices, below knee Bilateral lower extremities Dopplers today were negative.   -need to determine when OK to start Lovenox- due to high risk of DVT. 5/20- have called [facility] to see when can start- since had hematoma 5/22- hadn't heard back- will call Monday 5/24: reconsulted NSGY on this issue today 5/25- on Lovenox now- will need x 3 months total from surgery             -antiplatelet therapy: N/A--has been off Plavix.  3. Pain Management: Continue gabapentin tid with oxycodone prn. Well controlled 4. Mood: LCSW to follow for evaluation and support.              -antipsychotic agents: N/a 5. Neuropsych: This patient is capable of making decisions on his own behalf. 6. Skin/Wound Care: Routine pressure relief measures.  Discussed with pt in depth how necessary pressure  relief every 15-20 minutes is essential to prevent pressure ulcers and morbidity/complications. 7. Fluids/Electrolytes/Nutrition: Monitor I/O. Check lytes in am.  8. Cord compression:On decadron 4 mg IV every 6 hours-->will start taper  5/23- on PO taper- daily as of tomorrow x 3 days 9. HTN: Monitor BP tid--continue Norvasc, Cozaar and hydralazine.   10. T2DM: Monitor BS ac/hs. On Glucotrol daily with SSI for elevated BS- likely made worse by Decadron.    CBG (last 3)  Recent Labs    07/13/19 1711 07/13/19 2155 07/14/19 0628  GLUCAP 285* 255* 166*      5/23- Bgs elevated in last  24 hours-saw a lot of snack at bedside-/cookies- will ask pt to calm down on snacks  5/24: labile, continue to monitor.  5/25- BGs [id number]- will increase glipizide to 10 mg daily- and monitor closely- might be able to educe off decadron (on for next 2 days)  11. Neurogenic bladder: Continue Flomax. Discussed at length Foley vs in/out caths- has foley- asked pt and wife to discuss long term what would work best for them.  5/19- thinking about foley vs in/out caths  5/20- will remove foley and teach pt in/out caths asap per his request    5/22- learning today how to do in/out caths- appropriate and doing well per nursing  5/24- doing own caths x 48 hours  5/25- doing own caths 12. Hematochezia with Neurogenic bowel: Last BM 5/15 with bloody streaks. Will order KUB to evaluate stool burden. Continue Senna S. Will administer Miralax and enema today. Then start on bowel program nightly after dinner.  5/19- had a very large BM with enema last night- sounds like cleaned out.    5/22- good BM last night with program  5/23- placed own suppository  5/25- did own bowel program- results AFTER bowel program- 11pm and 5am.  13. CKD Stage IIIa: Will order post op labs in am to monitor lytes/renal status.  5/19- Cr 1.10 this AM 14. Leucocytosis / UTI- pseduomonasI: Question/likely due to steroids. Monitor for signs of  infection. Will check labs in am.  5/19- WBC 13.2- but still on decadron- no signs of infection-will monitor   5/21- WBC 30k!- low grade temp- Tm100.3- started on Keflex for likely UTI based on U/A results.   -will give 1 dose Rocephin considering WBC is 30k- just to make sure he's well covered.  5/22- Will change to Cetazidim 1G q8 hourse since WBC only slightly improved to 28k- stop Keflex- is pseudomonas- will double check with pharmacy on correct ABX  5/23- On Ceftazidime- 1G q8 hours- WBC down to 22k- doing better- con't IV ABX x 7 days total  5/25- end date 5/28-  15. ABLA: Last H/H checked prior to hematoma evacuation on 05/13-->recheck post op CBC in am.  5/19- Hb stable, however platelets down to 141 - will monitor closely.    5/21- Plts 129- down from 141k - will monitor closely  5/23- recheck labs Monday- monitor platelets especially, considering had hematoma/at risk for DVT.  5/25- Plts 230- doing much better 16. Hypokalemia: Last K+-3.1 --will recheck in am.  5/19- K+ 4.2- con't to monitor  17. GERD-   5/21- pt c/o Acid reflux Sx's- will order protonix in evening.   5/22- Sx's better- con't Protonix 18. Dry eyes  5/25- start tears drop QID and prn     LOS: 7 days A FACE TO FACE EVALUATION WAS PERFORMED  Fadil Macmaster 07/14/2019, 10:04 AM

## 2019-07-14 NOTE — Progress Notes (Signed)
Occupational Therapy Session Note  Patient Details  Name: Timothy Davenport MRN: [id number] Date of Birth: November 23, 1950  Today's Date: 07/14/2019 OT Individual Time: 1350-1500 OT Individual Time Calculation (min): 70 min    Short Term Goals: Week 1:  OT Short Term Goal 1 (Week 1): Pt will don shirt in unsupported sitting with min A OT Short Term Goal 2 (Week 1): Pt will thread brief and pants with min A OT Short Term Goal 3 (Week 1): Pt will perform slide board transfer to drop arm commode with contact guard OT Short Term Goal 4 (Week 1): Pt will perform shower transfer with LRAD with mod A  Skilled Therapeutic Interventions/Progress Updates:    Treatment session with focus on functional transfers, trunk control and dynamic sitting, and problem solving positioning to increase success with toileting.  Pt received semi-reclined in bed agreeable to therapy session.  Pt completed bed mobility with MinA -CGA to come to sitting at EOB.  Educated on proper placement for slide board with therapist placing board for initial transfer and then pt completing placement during all subsequent transfers with min question cues to correct as needed.  Therapist applied leg loops to LLE and encouraged use to increase success with transfers and simulated LB dressing.  Engaged in lateral leans in sitting at edge of mat with focus on trunk control and clearance of buttocks to simulate for clothing and hygiene during toileting.  Utilized theraband to simulate pants up/down over hips with pt requiring CGA for trunk control while completing lateral leans to complete clothing management.  Engaged in reaching in sitting to further challenge weight shifting and address trunk control as needed for clothing management, hygiene, and functional transfers.  Obtained wide drop arm BSC to complete toilet transfers during future therapy sessions, as pt will require wide seat to allow for safety with positioning of slide board and to  complete lateral leans for hygiene and clothing management.  Pt reporting desire to self-cath himself on toilet but having been previously unsuccessful when sitting upright in w/c.  Discussed plan to complete toilet transfers and setup to allow for practice to self-cath while on toilet.  Pt passed off to primary PT for next therapy session.  Therapy Documentation Precautions:  Precautions Precautions: Fall, Back Precaution Booklet Issued: No Precaution Comments: spinal Restrictions Weight Bearing Restrictions: No Pain:  Pt with no c/o pain   Therapy/Group: Individual Therapy  Simonne Come 07/14/2019, 4:00 PM

## 2019-07-14 NOTE — Patient Care Conference (Signed)
Inpatient RehabilitationTeam Conference and Plan of Care Update Date: 07/14/2019   Time: 1:52 PM    Patient Name: Timothy Davenport      Medical Record Number: [id number]  Date of Birth: [birth year] Sex: Male         Room/Bed: 4M08C/4M08C-01 Payor Info: Payor: MEDICARE / Plan: MEDICARE PART A / Product Type: *No Product type* /    Admit Date/Time:  07/07/2019  3:10 PM  Primary Diagnosis:  Paraplegia [facility])  Patient Active Problem List   Diagnosis Date Noted  . Paraplegia ([place]) 07/07/2019  . Neurogenic bowel 07/07/2019  . Neurogenic bladder 07/07/2019  . 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 Courtney Heys Care Coodinator Present: Loralee Pacas, LCSWA;Christina Sampson Goon, BSW;Other (comment)(Stacey Creig Hines, RN, BSN, CRRN) Nurse Present: Dorthula Nettles, RN PT Present: Apolinar Junes, PT OT Present: Willeen Cass, OT PPS Coordinator present : Ileana Ladd, Burna Mortimer, SLP     Current Status/Progress Goal Weekly Team Focus  Bowel/Bladder   Pt performing self caths q8 as needed and also performing own bowel program  Pt continues to self cath and perform bowel program with minimal assistance  Assess q shift and encourage patient to self cath as needed and perform bowel program   Swallow/Nutrition/ Hydration             ADL's   min A UB self care, mod A LB self care, max A toileting, min - mod A slide board transfers  Mod I UB dressing and bathing, independent grooming, and CGA for all other goals  self care retraining, balance, endurance, strengthening, and pt education   Mobility   min/mod bed mobility, min A SB transfer, Supervision w/c mobility  mod I overall at w/c level  custom w/c eval, sitting balance, transfers, SCI edu   Communication             Safety/Cognition/ Behavioral Observations            Pain   No current cmplaints of pain   Maintain as pain free as possible  Assess q shift and as needed. Administer prn pain meds as needed   Skin   No skin issues at this time  Maintain skin integrity  Assess q shift and as needed. Maintain skin integrity    Rehab Goals Patient on target to meet rehab goals: Yes *See Care Plan and progress notes for long and short-term goals.     Barriers to Discharge  Current Status/Progress Possible Resolutions Date Resolved   Nursing                  PT  Inaccessible home environment;Home environment access/layout;Neurogenic Bowel & Bladder  will need ramp installation prior to d/c home              OT                  SLP                Care Coordinator Decreased caregiver support;Lack of/limited family support              Discharge Planning/Teaching Needs:  D/c to home with his wife-supervision; and children will assist with providing care as wife is not physically able to assist  Family education as recommended   Team Discussion: Patient progressing towards discharge goals. MD may increase Sen[place]ot to decrease incontinent bowel movements following bowel program.  Revisions to Treatment Plan: MD added eye drops for dry eye, and Antibiotics complete on 07/17/19 from UTI.     Medical Summary Current Status: cathing self- doing own bowel program; Weekly Focus/Goal: OT_ min A UB; mod A LB; max A toileting- min-mod assist for sitting balance/propping  Barriers to Discharge: Decreased family/caregiver support;Neurogenic Bowel & Bladder;Incontinence;Home enviroment access/layout;Weight bearing restrictions  Barriers to Discharge Comments: d/c- try for Memorial day?- fam ed later week Possible Resolutions to Barriers: PT- CGA transfers; w/c eval this week-   Continued Need for Acute Rehabilitation Level of Care: The patient requires daily medical management by a physician with specialized training in physical medicine and rehabilitation for the following reasons: Direction of a  multidisciplinary physical rehabilitation program to maximize functional independence : Yes Medical management of patient stability for increased activity during participation in an intensive rehabilitation regime.: Yes Analysis of laboratory values and/or radiology reports with any subsequent need for medication adjustment and/or medical intervention. : Yes   I attest that I was present, lead the team conference, and concur with the assessment and plan of the team.   Cristi Loron 07/14/2019, 1:52 PM

## 2019-07-15 ENCOUNTER — Inpatient Hospital Stay (HOSPITAL_COMMUNITY): Payer: No Typology Code available for payment source

## 2019-07-15 ENCOUNTER — Inpatient Hospital Stay (HOSPITAL_COMMUNITY): Payer: No Typology Code available for payment source | Admitting: Physical Therapy

## 2019-07-15 ENCOUNTER — Inpatient Hospital Stay (HOSPITAL_COMMUNITY): Payer: No Typology Code available for payment source | Admitting: *Deleted

## 2019-07-15 LAB — GLUCOSE, CAPILLARY
Glucose-Capillary: 169 mg/dL — ABNORMAL HIGH (ref 70–99)
Glucose-Capillary: 197 mg/dL — ABNORMAL HIGH (ref 70–99)
Glucose-Capillary: 283 mg/dL — ABNORMAL HIGH (ref 70–99)
Glucose-Capillary: 196 mg/dL — ABNORMAL HIGH (ref 70–99)

## 2019-07-15 NOTE — Progress Notes (Signed)
Patient ID: Timothy Davenport, male   DOB: [birth year], 69 y.o.   MRN: [id number]  SW received return call from pt [name] to discuss pt d/c needs. Pt has upcoming video conference on June 7th @ 3pm for hoem modifications. States an urgent item (DME list) can be completed in efforts for pt to obtain his care needs; fax completed form, H&P, clinical notes, PT/OT evaluations to [phone]. Possible delay in pt getting items needed due to processes. Also reports if pt would like to obtain speciality w/c, [facility] their own [facility] that is used. Encourages follow-up with [facility] (p: [phone] ext [phone] or [phone]/[phone]) to inquire about turn around process for catheter delivery.  All HHA orders, scripts for catheters should be faxed to [facility] # [phone]; Catheter order should also be faxed to pharmacy.   SW spoke with [facility] to discuss catheters for pt. SW to follow-up once pt confirms preferred catheters.   *SW met with tp and pt wife [name] in room to provide above updates. Pt is agreeable to use Medicare benefits to initiate services/DME if needed so he can d/c home on 5/31. Pt understands that if he obtains specialty w/c under Medicare he may not be able to bill [facility] later. SW provided HHA list. Preferred HHA: 1) [facility] and 2) [facility]. Pt would like bed pads and pampers as well. Pt would also like ambulance transport to home.   SW to follow-up with [facility] to discuss speciality w/c. Pt has someone to pick up the loaner w/c if needed. SW to speak with [facility] about ordering catheters and length of time for delivery.   Loralee Pacas, MSW, [place] Office: [phone] Cell: [phone] Fax: [phone]

## 2019-07-15 NOTE — Plan of Care (Signed)
  Problem: RH BLADDER ELIMINATION Goal: RH STG MANAGE BLADDER WITH ASSISTANCE Description: STG Manage Bladder independently  Outcome: Progressing   Problem: RH PAIN MANAGEMENT Goal: RH STG PAIN MANAGED AT OR BELOW PT'S PAIN GOAL Description: Pain score of 4 or less Outcome: Progressing

## 2019-07-15 NOTE — Progress Notes (Signed)
Occupational Therapy Session Note  Patient Details  Name: KANSAS WALD MRN: [id number] Date of Birth: 1950-06-17  Today's Date: 07/15/2019 OT Individual Time: [id number] OT Individual Time Calculation (min): 72 min    Short Term Goals: Week 1:  OT Short Term Goal 1 (Week 1): Pt will don shirt in unsupported sitting with min A OT Short Term Goal 2 (Week 1): Pt will thread brief and pants with min A OT Short Term Goal 3 (Week 1): Pt will perform slide board transfer to drop arm commode with contact guard OT Short Term Goal 4 (Week 1): Pt will perform shower transfer with LRAD with mod A  Skilled Therapeutic Interventions/Progress Updates:    1:1. Pt received in bed agreeable to OT. Pt completes supine>sitting with CGA-S 2x throughout session. Pt slides with CGA-S with A to place board EOB<>w/c>TTB in shower, but requires MOD A to transfer out of shower d/t wet skin and pillow case on baord sticking. Pt able ot wash 10/10 body parts with cut out in TTB for buttocks. Also edu re lateral lean to clean buttocks if no cut out. Pt able ot thread BLE into pants in supported long sitting figure 4 and don teds with plastic bag over foot after edu on technique. Pt dons shirt and shoes EOB with MIN A for sitting balance and A for fastening shoes. Pt would benefit from elastic laces from energy conservaiton stand point. Pt completes grooming with set up at sink and edu re pressure relief options and DME at home. Exited session wih tpt seate din w/c, call light in reach and all need smet  Therapy Documentation Precautions:  Precautions Precautions: Fall, Back Precaution Booklet Issued: No Precaution Comments: spinal Restrictions Weight Bearing Restrictions: No General:   Vital Signs: Therapy Vitals Temp: 98.9 F (37.2 C) Temp Source: Oral Pulse Rate: 76 Resp: 16 BP: 110/83 Patient Position (if appropriate): Lying Oxygen Therapy SpO2: 100 % O2 Device: Room Air Pain:   ADL: ADL Eating:  (P) Independent Grooming: (P) Setup Upper Body Dressing: (P) Maximal assistance(performed with PT) Lower Body Dressing: (P) Dependent(performed with PT) Toileting: (P) Dependent Where Assessed-Toileting: (P) Bed level Toilet Transfer: (P) Not assessed(simulated mod A - now has the right comode in his room)) Vision   Perception    Praxis   Exercises:   Other Treatments:     Therapy/Group: Individual Therapy  Tonny Branch 07/15/2019, 7:01 AM

## 2019-07-15 NOTE — Progress Notes (Signed)
[facility] PHYSICAL MEDICINE & REHABILITATION PROGRESS NOTE   Subjective/Complaints:  Pt did own bowel program- results occurred ~ midnight, not with bowel program. Otherwise, feeling good- and feels like getting results.    ROS:  Pt denies SOB, abd pain, CP, N/V/C/D, and vision changes   Objective:   No results found. Recent Labs    07/13/19 1215  WBC 17.3*  HGB 11.9*  HCT 36.3*  PLT 230   Recent Labs    07/13/19 1215  NA 136  K 4.4  CL 100  CO2 26  GLUCOSE 81  BUN 26*  CREATININE 1.04  CALCIUM 9.7    Intake/Output Summary (Last 24 hours) at 07/15/2019 0930 Last data filed at 07/15/2019 0800 Gross per 24 hour  Intake 603.8 ml  Output 4225 ml  Net -3621.2 ml     Physical Exam: Vital Signs Blood pressure 110/83, pulse 76, temperature 98.9 F (37.2 C), temperature source Oral, resp. rate 16, height 5\' 10"  (1.778 m), weight 79.8 kg, SpO2 100 %.  Physical Exam  Constitutional: sitting up in w/c at sink, appropriate, NAD HENT: conjugate gaze R eye additional skin tag- medial lower eye  Cardiovascular: RRR Respiratory: CTA B/L GI: Soft, NT, ND, (+)BS   Musculoskeletal:        General: No edema.     Comments: UEs deltoids, biceps, triceps, WE, grip and finger abd 5/5 B/L 0/5 in HF, KE, KF, DF and PF B/L - no change Neurological: Ox3 Mildly decreased sensation reduction from  L1 to S5 B/L- is a little better on RLE however pt said it's "mild"- can feel everything from "head to bottom" No hoffman's B/L No spasms seen today- but not moving currently Skin: intact; clean, dry Psychiatric:bsmiling   Assessment/Plan: 1. Functional deficits secondary to T8 paraplegia ASIA B which require 3+ hours per day of interdisciplinary therapy in a comprehensive inpatient rehab setting.  Physiatrist is providing close team supervision and 24 hour management of active medical problems listed below.  Physiatrist and rehab team continue to assess barriers to  discharge/monitor patient progress toward functional and medical goals  Care Tool:  Bathing    Body parts bathed by patient: Right arm, Left arm, Chest, Abdomen, Front perineal area, Right upper leg, Left upper leg, Right lower leg, Left lower leg, Face   Body parts bathed by helper: Front perineal area, Buttocks, Left lower leg, Right lower leg     Bathing assist Assist Level: Minimal Assistance - Patient > 75%(supported in bed)     Upper Body Dressing/Undressing Upper body dressing Upper body dressing/undressing activity did not occur (including orthotics): N/A What is the patient wearing?: Pull over shirt    Upper body assist Assist Level: Minimal Assistance - Patient > 75%    Lower Body Dressing/Undressing Lower body dressing      What is the patient wearing?: Incontinence brief, Pants     Lower body assist Assist for lower body dressing: Moderate Assistance - Patient 50 - 74%(in bed)     Toileting Toileting    Toileting assist Assist for toileting: Total Assistance - Patient < 25%     Transfers Chair/bed transfer  Transfers assist  Chair/bed transfer activity did not occur: N/A  Chair/bed transfer assist level: Minimal Assistance - Patient > 75%     Locomotion Ambulation   Ambulation assist   Ambulation activity did not occur: Safety/medical concerns          Walk 10 feet activity   Assist  Walk 10  feet activity did not occur: Safety/medical concerns        Walk 50 feet activity   Assist Walk 50 feet with 2 turns activity did not occur: Safety/medical concerns         Walk 150 feet activity   Assist Walk 150 feet activity did not occur: Safety/medical concerns         Walk 10 feet on uneven surface  activity   Assist Walk 10 feet on uneven surfaces activity did not occur: Safety/medical concerns         Wheelchair     Assist Will patient use wheelchair at discharge?: Yes Type of Wheelchair: Manual     Wheelchair assist level: Supervision/Verbal cueing Max wheelchair distance: 150'    Wheelchair 50 feet with 2 turns activity    Assist        Assist Level: Supervision/Verbal cueing   Wheelchair 150 feet activity     Assist      Assist Level: Supervision/Verbal cueing   Blood pressure 110/83, pulse 76, temperature 98.9 F (37.2 C), temperature source Oral, resp. rate 16, height 5\' 10"  (1.778 m), weight 79.8 kg, SpO2 100 %.   Medical Problem List and Plan: 1.  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              -patient may  shower             -ELOS/Goals: 3-3.5 weeks- goals Mod I to min assist  5/23- pt wants to leave, if possible, by Friday- explained likely 3 weeks from rehab admission  5/25- pt really wants to leave memorial day- d/c staples  5/26- trying to send home 5/31 if possible- wil make final call Friday this week if possible  -Continue CIR 2.  Antithrombotics: -DVT/anticoagulation:  Mechanical: Sequential compression devices, below knee Bilateral lower extremities Dopplers today were negative.   -need to determine when OK to start Lovenox- due to high risk of DVT. 5/20- have called [facility] to see when can start- since had hematoma 5/22- hadn't heard back- will call Monday 5/24: reconsulted NSGY on this issue today 5/25- on Lovenox now- will need x 3 months total from surgery             -antiplatelet therapy: N/A--has been off Plavix.  3. Pain Management: Continue gabapentin tid with oxycodone prn. Well controlled 4. Mood: LCSW to follow for evaluation and support.              -antipsychotic agents: N/a 5. Neuropsych: This patient is capable of making decisions on his own behalf. 6. Skin/Wound Care: Routine pressure relief measures.  Discussed with pt in depth how necessary pressure relief every 15-20 minutes is essential to prevent pressure ulcers and  morbidity/complications. 7. Fluids/Electrolytes/Nutrition: Monitor I/O. Check lytes in am.  8. Cord compression:On decadron 4 mg IV every 6 hours-->will start taper  5/23- on PO taper- daily as of tomorrow x 3 days 9. HTN: Monitor BP tid--continue Norvasc, Cozaar and hydralazine.   10. T2DM: Monitor BS ac/hs. On Glucotrol daily with SSI for elevated BS- likely made worse by Decadron.    CBG (last 3)  Recent Labs    07/14/19 1639 07/14/19 2101 07/15/19 0621  GLUCAP 207* 202* 169*      5/23- Bgs elevated in last 24 hours-saw a lot of snack at bedside-/cookies- will ask pt to calm down on snacks  5/24: labile, continue to  monitor.  5/25- BGs [id number]- will increase glipizide to 10 mg daily- and monitor closely- might be able to educe off decadron (on for next 2 days)   5/26- decadron finishes tomorrow- will monitor 11. Neurogenic bladder: Continue Flomax. Discussed at length Foley vs in/out caths- has foley- asked pt and wife to discuss long term what would work best for them.  5/19- thinking about foley vs in/out caths  5/20- will remove foley and teach pt in/out caths asap per his request    5/22- learning today how to do in/out caths- appropriate and doing well per nursing  5/24- doing own caths x 48 hours  5/25- doing own caths 12. Hematochezia with Neurogenic bowel: Last BM 5/15 with bloody streaks. Will order KUB to evaluate stool burden. Continue Senna S. Will administer Miralax and enema today. Then start on bowel program nightly after dinner.  5/19- had a very large BM with enema last night- sounds like cleaned out.    5/22- good BM last night with program  5/23- placed own suppository  5/25- did own bowel program- results AFTER bowel program- 11pm and 5am.   5/26- Bowel movement midnight after bowel program 13. CKD Stage IIIa: Will order post op labs in am to monitor lytes/renal status.  5/19- Cr 1.10 this AM 14. Leucocytosis / UTI- pseduomonasI: Question/likely due to  steroids. Monitor for signs of infection. Will check labs in am.  5/19- WBC 13.2- but still on decadron- no signs of infection-will monitor   5/21- WBC 30k!- low grade temp- Tm100.3- started on Keflex for likely UTI based on U/A results.   -will give 1 dose Rocephin considering WBC is 30k- just to make sure he's well covered.  5/22- Will change to Cetazidim 1G q8 hourse since WBC only slightly improved to 28k- stop Keflex- is pseudomonas- will double check with pharmacy on correct ABX  5/23- On Ceftazidime- 1G q8 hours- WBC down to 22k- doing better- con't IV ABX x 7 days total  5/25- end date 5/28-  5/26- recheck labs in AM  15. ABLA: Last H/H checked prior to hematoma evacuation on 05/13-->recheck post op CBC in am.  5/19- Hb stable, however platelets down to 141 - will monitor closely.    5/21- Plts 129- down from 141k - will monitor closely  5/23- recheck labs Monday- monitor platelets especially, considering had hematoma/at risk for DVT.  5/25- Plts 230- doing much better 16. Hypokalemia: Last K+-3.1 --will recheck in am.  5/19- K+ 4.2- con't to monitor  17. GERD-   5/21- pt c/o Acid reflux Sx's- will order protonix in evening.   5/22- Sx's better- con't Protonix 18. Dry eyes  5/25- start tears drop QID and prn     LOS: 8 days A FACE TO FACE EVALUATION WAS PERFORMED  Megan Lovorn 07/15/2019, 9:30 AM

## 2019-07-15 NOTE — Evaluation (Signed)
Recreational Therapy Assessment and Plan  Patient Details  Name: Timothy Davenport MRN: [id number] Date of Birth: March 13, 1950 Today's Date: 07/15/2019  Rehab Potential:  Good ELOS:   d/c 5/31  Assessment   Problem List:      Patient Active Problem List   Diagnosis Date Noted  . Paraplegia ([place]) 07/07/2019  . Neurogenic bowel 07/07/2019  . Neurogenic bladder 07/07/2019  . 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 Colon polyp   . Diabetes mellitus without complication ([place])   . Diabetic neuropathy ([place])   . HTN (hypertension)   . Patella fracture   . Renal calculi    Past Surgical History:       Past Surgical History:  Procedure Laterality Date  . LUMBAR LAMINECTOMY/DECOMPRESSION MICRODISCECTOMY N/A 07/01/2019   Procedure: LUMBAR LAMINECTOMY/DECOMPRESSION MICRODISCECTOMY 1 LEVEL, THORACIC SIX-SEVEN;  Surgeon: Consuella Lose, MD;  Location: [facility];  Service: Neurosurgery;  Laterality: N/A;  LUMBAR LAMINECTOMY/DECOMPRESSION MICRODISCECTOMY 1 LEVEL, THORACIC SIX-SEVEN   . ORIF PATELLA    . THORACIC DISCECTOMY N/A 07/03/2019   Procedure: Evacuation of Thoracic Hematoma;  Surgeon: Consuella Lose, MD;  Location: [facility];  Service: Neurosurgery;  Laterality: N/A;    Assessment & Plan Clinical Impression: Timothy Davenport is a 69 year old male with history of T2DM with neuropathy, recent TIA who started developing RLE weakness/numbness about two months ago with recurrent falls and was admitted on 06/30/19 after a fall with inability to move LLE and worsening of RLE weakness. MRI brain was negative for acute changes but showed right parotid gland neoplasms. 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 He was evaluated by Dr. Kathyrn Sheriff and taken to the OR on 05/12 for T6 and T7  laminectomy and right T6 and T7 pediculectomy for microdiscectomy at T6/T7.   Postop had issues with hypotension and dizziness with presyncope as well as urinary retention. He was started on IV Decadron as follow-up MRI showed large hematoma in laminectomy bed and paraspinal soft tissues resulting in severe cord compression in conjunction to residual small disc protrusion and cord edema from T5 to mid T7. Due to neurological decline he was taken back to the OR on 05/14 for evacuation of hematoma and on decadron taper. He continues to have paraplegia but reports some increase in sensation. Therapy ongoing and CIR recommended due to functional decline in ability to complete ADLs   Patient transferred to CIR on 07/07/2019 .   Pt presents with decreased activity tolerance, decreased functional mobility, decreased balance Limiting pt's independence with leisure/community pursuits.  Met with pt today to discuss TR services, leisure interests, activity analysis identifying potential modifications and community reintegration.  Plan  Min 1 TR session >20 minutes per week during LOS  Recommendations for other services: None   Discharge Criteria: Patient will be discharged from TR if patient refuses treatment 3 consecutive times without medical reason.  If treatment goals not met, if there is a change in medical status, if patient makes no progress towards goals or if patient is discharged from hospital.  The above assessment, treatment plan, treatment alternatives and goals were discussed and mutually agreed upon: by patient  [place] 07/15/2019, 3:28 PM

## 2019-07-15 NOTE — Progress Notes (Signed)
Occupational Therapy Session Note  Patient Details  Name: Timothy Davenport MRN: [id number] Date of Birth: 01/20/1951  Today's Date: 07/15/2019 OT Individual Time: 7837-5423 OT Individual Time Calculation (min): 60 min    Short Term Goals: Week 1:  OT Short Term Goal 1 (Week 1): Pt will don shirt in unsupported sitting with min A OT Short Term Goal 2 (Week 1): Pt will thread brief and pants with min A OT Short Term Goal 3 (Week 1): Pt will perform slide board transfer to drop arm commode with contact guard OT Short Term Goal 4 (Week 1): Pt will perform shower transfer with LRAD with mod A  Skilled Therapeutic Interventions/Progress Updates:    Pt received supine with no c/o pain, agreeable to session. Pt transitioned to EOB with min guard, heavy use of bed features. Pt completed slideboard transfer to the w/c with CGA and support of equipment. Pt completed 400+ ft of w/c propulsion with several rest breaks. Cueing provided for correcting seated posture/hip orientation, and pt was able to self adjust with w/c pushup. Pt able to propel w/c up incline outdoors with increased time/effort but no physical assist. Discussed accessibility in the community. Pt navigated w/c around gift shop to practice maneuvering in small spaces. Pt with several questions re general health and wellness, all answered within scope of OT. Pt returned to the unit and completed 10 min on the BUE ergometer to improve BUE endurance/strength needed to propel w/c. Pt returned to his room and used slideboard to transfer back to bed with set up assist for board and CGA. Mod A to return LE to bed. Pt was left supine with all needs met, bed alarm set.   Therapy Documentation Precautions:  Precautions Precautions: Fall, Back Precaution Booklet Issued: No Precaution Comments: spinal Restrictions Weight Bearing Restrictions: No   Therapy/Group: Individual Therapy  Curtis Sites 07/15/2019, 6:44 AM

## 2019-07-15 NOTE — Progress Notes (Signed)
Physical Therapy Weekly Progress Note  Patient Details  Name: Timothy Davenport MRN: [id number] Date of Birth: 1951/02/07  Beginning of progress report period: Jul 08, 2019 End of progress report period: Jul 15, 2019  Today's Date: 07/15/2019 PT Individual Time: 1100-1200 PT Individual Time Calculation (min): 60 min   Patient has met 3 of 4 short term goals.  Pt is making great progress towards therapy goals. He exhibits great motivation and participation in therapy sessions. Pt is currently at min to mod A level for bed mobility, is able to perform slide board transfers with CGA to min A with good recall of sequencing of transfer and safety precautions. Pt is at Supervision to mod I level with w/c mobility and just needs min cues for some management of w/c parts. Pt demonstrates good understanding of and adherence to pressure relief schedule. Weekly focus on d/c planning and family education later this week as pt is wanting to d/c home early next week.  Patient continues to demonstrate the following deficits muscle weakness and muscle paralysis, decreased cardiorespiratoy endurance, abnormal tone and unbalanced muscle activation and decreased sitting balance, decreased postural control and decreased balance strategies and therefore will continue to benefit from skilled PT intervention to increase functional independence with mobility.  Patient progressing toward long term goals..  Continue plan of care.  PT Short Term Goals Week 1:  PT Short Term Goal 1 (Week 1): Pt will perform level SBT w/cga PT Short Term Goal 1 - Progress (Week 1): Progressing toward goal PT Short Term Goal 2 (Week 1): Pt will be I w/pressure relief management PT Short Term Goal 2 - Progress (Week 1): Met PT Short Term Goal 3 (Week 1): Pt will maintain static sitting x 5 min w/cga on mat PT Short Term Goal 3 - Progress (Week 1): Met PT Short Term Goal 4 (Week 1): Pt will roll w/supervision and cues maintaining spinal  precautions PT Short Term Goal 4 - Progress (Week 1): Met Week 2:  PT Short Term Goal 1 (Week 2): =LTG due to ELOS  Skilled Therapeutic Interventions/Progress Updates:    Pt received seated in w/c in room, agreeable to PT session. No complaints of pain. Pt agreeable to attempt transfer to wide drop arm BSC this date and perform self-cathing while seated on BSC. Slide board transfer w/c to bed then bed to wide DABSC with min A. Pt is able to doff his pants and brief while seated on BSC with min A for sitting balance. RN in room to assist pt with setting up I/O cath. Pt requires min A for self-cathing with 2nd person standing by for balance for safety. Pt is able to maintain overall good seated balance in semi-reclined position on BSC. Pt does require assist to don pants once he is done cathing, needs assist to pull pants back up over hips. Slide board transfer back to bed then to w/c with min A. Manual w/c propulsion x 150 ft with use of BUE at Supervision level. Slide board transfer w/c to/from mat table with min A. Seated balance EOM with min A overall while bringing therapy ball in a circle around pt's waist, x 5 reps each direction. Seated chest press-outs x 5 reps with mod to max A to maintain sitting balance and prevent posterior LOB. Assisted pt back to bed at end of session. Sit to supine mod A for BLE management. Pt left semi-reclined in bed with needs in reach at end of session.  Therapy Documentation  Precautions:  Precautions Precautions: Fall, Back Precaution Booklet Issued: No Precaution Comments: spinal Restrictions Weight Bearing Restrictions: No   Therapy/Group: Individual Therapy   Excell Seltzer, PT, DPT  07/15/2019, 12:51 PM

## 2019-07-16 ENCOUNTER — Encounter (HOSPITAL_COMMUNITY): Payer: No Typology Code available for payment source | Admitting: Occupational Therapy

## 2019-07-16 ENCOUNTER — Ambulatory Visit (HOSPITAL_COMMUNITY): Payer: No Typology Code available for payment source | Admitting: Physical Therapy

## 2019-07-16 ENCOUNTER — Inpatient Hospital Stay (HOSPITAL_COMMUNITY): Payer: No Typology Code available for payment source | Admitting: *Deleted

## 2019-07-16 LAB — GLUCOSE, CAPILLARY
Glucose-Capillary: 142 mg/dL — ABNORMAL HIGH (ref 70–99)
Glucose-Capillary: 154 mg/dL — ABNORMAL HIGH (ref 70–99)
Glucose-Capillary: 247 mg/dL — ABNORMAL HIGH (ref 70–99)

## 2019-07-16 NOTE — Progress Notes (Signed)
Pt had large bowel movement, pt also self cathed self removing 800 ml of urine. No problems to report at this time.

## 2019-07-16 NOTE — Progress Notes (Signed)
Recreational Therapy Session Note  Patient Details  Name: Timothy Davenport MRN: [id number] Date of Birth: [birth year] Today's Date: 07/16/2019  Pain: no c/o Skilled Therapeutic Interventions/Progress Updates: Session focused on activity tolerance, dynamic sitting balance, slide board transfers, community reintegration education (public restroom access for cath, body awareness with outdoor activities, activities to involve his grandchildren, directing his care/self advocacy during co-treat with PT.  Pt long sitting on the mat with min-mod assist for balance during ball toss activity.   transitioned to Zoom ball for additional balance challenge with min-mod assist.  Pt directed slide board transfer with supervision and performed w/c mobility on the unit using BUEs with supervision.  Pt performed slide board transfer to bed with min assist.  Therapy/Group: Co-Treatment Lendy Dittrich 07/16/2019, 3:52 PM

## 2019-07-16 NOTE — Progress Notes (Signed)
Due to patient's chronic urinary retention he will need to perform clean intermittent catheterizations 4-6 times per day with a straight tip catheter of catheter size #16

## 2019-07-16 NOTE — Progress Notes (Signed)
Patient completed own bowel program after dinner. Nurse stood in room and offered help if needed with bowel program. Patient declined help and did bowel program completely independent.  Timothy Davenport

## 2019-07-16 NOTE — Progress Notes (Signed)
Occupational Therapy Session Note  Patient Details  Name: Timothy Davenport MRN: [id number] Date of Birth: 1950-07-03  Today's Date: 07/16/2019 OT Individual Time: 1005-1100 OT Individual Time Calculation (min): 55 min    Short Term Goals: Week 1:  OT Short Term Goal 1 (Week 1): Pt will don shirt in unsupported sitting with min A OT Short Term Goal 2 (Week 1): Pt will thread brief and pants with min A OT Short Term Goal 3 (Week 1): Pt will perform slide board transfer to drop arm commode with contact guard OT Short Term Goal 4 (Week 1): Pt will perform shower transfer with LRAD with mod A  Skilled Therapeutic Interventions/Progress Updates:  Patient met lying supine in bed in agreement with OT treatment session with focus on family education, functional transfers, and self-care re-education as detailed below. Patient's eldest son and wife present at bedside. With [facility] elevated in supported sitting, patient directed his wife and son on assisting with UB/LB dressing at bed level. Patient able to thread BUE through shirt and pull over head with Min A to maintain balance. Patient also able to thread BLE through pants in supported long-sitting and assistance to hike over hips with HOB flat. OT provided patient/family education on allowing time for activity pacing and for scheduling appointments in late afternoon to allow adequate time for morning BADLs. Patient/family expressed verbal understanding. Patient able to recall technique for donning TED hose with plastic bag from previous OT session. Supine to EOB with Min A and use of bed features. OT provided family education on supporting BLE to maintain proper alignment. SB transfer to [facility] on R with CGA-Min A. Family education on proper body mechanics, placement of SB to facilitate transfer, and proper hand placement. OT also provided education on placement of DABSC and sequencing for clothing/hygiene management with recommendation for lateral leans R<>L.  Son assisted patient with transfer from [facility] to wc with patient able to effectively verbalize needs. Son with some apprehension and difficulty with transfer. Family would benefit from continued education on tub/shower transfers with use of TTB, continued opportunities for practice with placing SB and proper body mechanics, and assisting patient with toileting/hygiene/clothing management. Session concluded with patient seated in wc with call bell within reach, all needs met, and family at present at bedside.   Therapy Documentation Precautions:  Precautions Precautions: Fall, Back Precaution Booklet Issued: No Precaution Comments: spinal Restrictions Weight Bearing Restrictions: No General:    Therapy/Group: Individual Therapy  Raimi Guillermo R [place] 07/16/2019, 3:08 PM

## 2019-07-16 NOTE — Progress Notes (Signed)
Physical Therapy Session Note  Patient Details  Name: Timothy Davenport MRN: [id number] Date of Birth: 10-Jul-1950  Today's Date: 07/16/2019 PT Individual Time: 1100-1200; 1430-1530 PT Individual Time Calculation (min): 60 min and 60 min  Short Term Goals: Week 2:  PT Short Term Goal 1 (Week 2): =LTG due to ELOS  Skilled Therapeutic Interventions/Progress Updates:    Session 1: Pt received seated in w/c in room, agreeable to PT session. Pt's wife [name] and son Zenaida Deed present for hands-on family education session. Pt and his family requesting to see car transfer. Pt is at mod I level for w/c mobility up to 200 ft downstairs to main entrance of the hospital for car transfer with family's vehicle of [employer]. Pt is max A for SB transfer uphill into his vehicle. Pt not deemed safe to perform this transfer with family due to significant height difference between w/c seat height and vehicle passenger seat height. Per family report they have a sedan that they can bring tomorrow for family education. Returned to pt's room for remainder of session. Slide board transfer w/c to bed with min A with pt able to self direct caregiver through setup and sequencing of transfer. Pt is mod A for sit to supine. Once in supine reviewed BLE PROM stretching program to prevent contracture. Handout and demonstration provided to family and they are able to perform return demo. Pt left semi-reclined in bed in bed with needs in reach and family present at end of session. Will continue family education tomorrow.  Session 2: Pt received seated in bed, agreeable to PT session. No complaints of pain. Bed mobility min A. SB transfer to w/c with min A throughout session. Pt is at mod I level for w/c mobility x 150 ft. Session focus on dynamic sitting balance in long-sitting position on mat table. Pt is mod A to achieve long-sitting position from supine on mat table with assist for trunk elevation. While in long-sitting position pt able to  engage in ball toss and "zoom ball" with min to mod A from therapist for trunk control while LRT performs ball toss and "zoom ball" with patient. Recreational therapist engages pt in conversation with regards to energy conservation, bladder management in the community, engagement with family at w/c level upon d/c home, etc. Pt requests to return to bed at end of session. Slide board transfer with min A. Sit to supine mod A for BLE management. Pt left in care of LRT at end of session. Cotreatment session with LRT.   Therapy Documentation Precautions:  Precautions Precautions: Fall, Back Precaution Booklet Issued: No Precaution Comments: spinal Restrictions Weight Bearing Restrictions: No    Therapy/Group: Individual Therapy   Excell Seltzer, PT, DPT  07/16/2019, 4:16 PM

## 2019-07-16 NOTE — Progress Notes (Signed)
Patient ID: Timothy Davenport, male   DOB: [birth year], 69 y.o. MRN: [id number]   HHPT/OT/SN/CNA/SW referral accepted by [facility] under Medicare.   SW sent DME orders: hospital bed, 30" slide board, and DABSC to [place] via parachute. Wide DABSC not an option as pt does not meet weight requirement.   SW called [facility] (p: [phone] ext [phone] or [phone]/S:[phone]) to inquire on process for obtaining catheters, and other incontinence supplies, reports that orders all must go through PCP.   SW faxed orders and clinical updates to Dr. Carie Caddy [phone].  SW spoke with Wellington Hampshire with [employer] about pt. States would use pt Medicare and BCBS for items. Will have samples shipped to room for pt.  SW faxed catheter order and incontinence supply order forms to [employer] (p: [phone]/G:[phone]).  SW left message for Abundio Miu [phone] ext [phone]) to inform on orders sent, HHA, and clinical notes to Dr. Carie Caddy.  *SW met with pt, pt wife Timothy Davenport, and son Timothy Davenport on above with regard to how DME will be ordered since pt does not have Medicare Part B, and only has BCBS. SW provided resources on Medicare and enrollment.   Loralee Pacas, MSW, [place] Office: [phone] Cell: [phone] Fax: [phone]

## 2019-07-16 NOTE — Progress Notes (Addendum)
[place] PHYSICAL MEDICINE & REHABILITATION PROGRESS NOTE   Subjective/Complaints:  Pt reports doing his own bowel program- good results last night- doing own caths as well.   Discussed sexuality and intimacy for 20 minutes this AM- used Cialis low dose from [place] prior- will need to do max dose.  Also notes having spasms of LEs- however only last 1-2 seconds- annoying.     ROS:  Pt denies SOB, abd pain, CP, N/V/C/D, and vision changes   Objective:   No results found. Recent Labs    07/13/19 1215  WBC 17.3*  HGB 11.9*  HCT 36.3*  PLT 230   Recent Labs    07/13/19 1215  NA 136  K 4.4  CL 100  CO2 26  GLUCOSE 81  BUN 26*  CREATININE 1.04  CALCIUM 9.7    Intake/Output Summary (Last 24 hours) at 07/16/2019 0901 Last data filed at 07/16/2019 0818 Gross per 24 hour  Intake 822 ml  Output 2275 ml  Net -1453 ml     Physical Exam: Vital Signs Blood pressure 124/81, pulse 95, temperature 98.4 F (36.9 C), resp. rate 18, height 5\' 10"  (1.778 m), weight 79.8 kg, SpO2 100 %.  Physical Exam  Constitutional: sitting up in bed; appropriate, engaged, NAD HENT: conjugate gaze R eye additional skin tag- medial lower eye  Cardiovascular: RRR Respiratory: CTA B/L GI: Soft, NT, ND, (+)BS    Musculoskeletal:        General: No edema.     Comments: UEs deltoids, biceps, triceps, WE, grip and finger abd 5/5 B/L 0/5 in HF, KE, KF, DF and PF B/L - no change Neurological: Ox3 Mildly decreased sensation reduction from  L1 to S5 B/L- is a little better on RLE however pt said it's "mild"- can feel everything from "head to bottom" No hoffman's B/L Multiple spasms seen in LEs Skin: intact; clean, dry Psychiatric:appropriate,engaged   Assessment/Plan: 1. Functional deficits secondary to T8 paraplegia ASIA B which require 3+ hours per day of interdisciplinary therapy in a comprehensive inpatient rehab setting.  Physiatrist is providing close team supervision and 24 hour  management of active medical problems listed below.  Physiatrist and rehab team continue to assess barriers to discharge/monitor patient progress toward functional and medical goals  Care Tool:  Bathing    Body parts bathed by patient: Right arm, Left arm, Chest, Abdomen, Front perineal area, Right upper leg, Left upper leg, Right lower leg, Left lower leg, Face   Body parts bathed by helper: Front perineal area, Buttocks, Left lower leg, Right lower leg     Bathing assist Assist Level: Minimal Assistance - Patient > 75%(supported in bed)     Upper Body Dressing/Undressing Upper body dressing Upper body dressing/undressing activity did not occur (including orthotics): N/A What is the patient wearing?: Pull over shirt    Upper body assist Assist Level: Minimal Assistance - Patient > 75%    Lower Body Dressing/Undressing Lower body dressing      What is the patient wearing?: Incontinence brief, Pants     Lower body assist Assist for lower body dressing: Moderate Assistance - Patient 50 - 74%(in bed)     Toileting Toileting    Toileting assist Assist for toileting: Total Assistance - Patient < 25%     Transfers Chair/bed transfer  Transfers assist  Chair/bed transfer activity did not occur: N/A  Chair/bed transfer assist level: Minimal Assistance - Patient > 75%     Locomotion Ambulation   Ambulation assist  Ambulation activity did not occur: Safety/medical concerns          Walk 10 feet activity   Assist  Walk 10 feet activity did not occur: Safety/medical concerns        Walk 50 feet activity   Assist Walk 50 feet with 2 turns activity did not occur: Safety/medical concerns         Walk 150 feet activity   Assist Walk 150 feet activity did not occur: Safety/medical concerns         Walk 10 feet on uneven surface  activity   Assist Walk 10 feet on uneven surfaces activity did not occur: Safety/medical concerns          Wheelchair     Assist Will patient use wheelchair at discharge?: Yes Type of Wheelchair: Manual    Wheelchair assist level: Supervision/Verbal cueing Max wheelchair distance: 150'    Wheelchair 50 feet with 2 turns activity    Assist        Assist Level: Supervision/Verbal cueing   Wheelchair 150 feet activity     Assist      Assist Level: Supervision/Verbal cueing   Blood pressure 124/81, pulse 95, temperature 98.4 F (36.9 C), resp. rate 18, height 5\' 10"  (1.778 m), weight 79.8 kg, SpO2 100 %.   Medical Problem List and Plan: 1.  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              -patient may  shower             -ELOS/Goals: 3-3.5 weeks- goals Mod I to min assist  5/23- pt wants to leave, if possible, by Friday- explained likely 3 weeks from rehab admission  5/25- pt really wants to leave memorial day- d/c staples  5/26- trying to send home 5/31 if possible- wil make final call Friday this week if possible  5/27- discussed sexuality/intimacy in depth this AM- 20 minutes on topic- suggest getting [facility] to increase Cialis to max dose  -Continue CIR 2.  Antithrombotics: -DVT/anticoagulation:  Mechanical: Sequential compression devices, below knee Bilateral lower extremities Dopplers today were negative.   -need to determine when OK to start Lovenox- due to high risk of DVT. 5/20- have called [facility] to see when can start- since had hematoma 5/22- hadn't heard back- will call Monday 5/24: reconsulted NSGY on this issue today 5/25- on Lovenox now- will need x 3 months total from surgery             -antiplatelet therapy: N/A--has been off Plavix.  3. Pain Management: Continue gabapentin tid with oxycodone prn. Well controlled 4. Mood: LCSW to follow for evaluation and support.              -antipsychotic agents: N/a 5. Neuropsych: This patient is capable of  making decisions on his own behalf. 6. Skin/Wound Care: Routine pressure relief measures.  Discussed with pt in depth how necessary pressure relief every 15-20 minutes is essential to prevent pressure ulcers and morbidity/complications. 7. Fluids/Electrolytes/Nutrition: Monitor I/O. Check lytes in am.  8. Cord compression:On decadron 4 mg IV every 6 hours-->will start taper  5/23- on PO taper- daily as of tomorrow x 3 days 9. HTN: Monitor BP tid--continue Norvasc, Cozaar and hydralazine.   10. T2DM: Monitor BS ac/hs. On Glucotrol daily with SSI for elevated BS- likely made worse by Decadron.    CBG (last 3)  Recent Labs  07/15/19 1646 07/15/19 2057 07/16/19 0611  GLUCAP 283* 196* 142*      5/23- Bgs elevated in last 24 hours-saw a lot of snack at bedside-/cookies- will ask pt to calm down on snacks  5/24: labile, continue to monitor.  5/25- BGs [id number]- will increase glipizide to 10 mg daily- and monitor closely- might be able to educe off decadron (on for next 2 days)   5/26- decadron finishes tomorrow- will monitor  5/27- last dose of Decadron- hopefully will improve some on its own 11. Neurogenic bladder: Continue Flomax. Discussed at length Foley vs in/out caths- has foley- asked pt and wife to discuss long term what would work best for them.  5/19- thinking about foley vs in/out caths  5/20- will remove foley and teach pt in/out caths asap per his request    5/22- learning today how to do in/out caths- appropriate and doing well per nursing  5/24- doing own caths x 48 hours  5/25- doing own caths 12. Hematochezia with Neurogenic bowel: Last BM 5/15 with bloody streaks. Will order KUB to evaluate stool burden. Continue Senna S. Will administer Miralax and enema today. Then start on bowel program nightly after dinner.  5/19- had a very large BM with enema last night- sounds like cleaned out.    5/22- good BM last night with program  5/23- placed own suppository  5/25- did own  bowel program- results AFTER bowel program- 11pm and 5am.   5/26- Bowel movement midnight after bowel program  5/27- results with bowel program 13. CKD Stage IIIa: Will order post op labs in am to monitor lytes/renal status.  5/19- Cr 1.10 this AM 14. Leucocytosis / UTI- pseduomonasI: Question/likely due to steroids. Monitor for signs of infection. Will check labs in am.  5/19- WBC 13.2- but still on decadron- no signs of infection-will monitor   5/21- WBC 30k!- low grade temp- Tm100.3- started on Keflex for likely UTI based on U/A results.   -will give 1 dose Rocephin considering WBC is 30k- just to make sure he's well covered.  5/22- Will change to Cetazidim 1G q8 hourse since WBC only slightly improved to 28k- stop Keflex- is pseudomonas- will double check with pharmacy on correct ABX  5/23- On Ceftazidime- 1G q8 hours- WBC down to 22k- doing better- con't IV ABX x 7 days total  5/25- end date 5/28-  5/27- labs in AM 15. ABLA: Last H/H checked prior to hematoma evacuation on 05/13-->recheck post op CBC in am.  5/19- Hb stable, however platelets down to 141 - will monitor closely.    5/21- Plts 129- down from 141k - will monitor closely  5/23- recheck labs Monday- monitor platelets especially, considering had hematoma/at risk for DVT.  5/25- Plts 230- doing much better 16. Hypokalemia: Last K+-3.1 --will recheck in am.  5/19- K+ 4.2- con't to monitor  17. GERD-   5/21- pt c/o Acid reflux Sx's- will order protonix in evening.   5/22- Sx's better- con't Protonix 18. Dry eyes  5/25- start tears drop QID and prn  19. Patient is a T8 paraplegia ASIA B- will absolutely NEED ULTRA light weight rigid manual w/c due to his paraplegia, impaired sensation, and need at 69 yrs old to be able to propel w/c at community distances- if had a heavier w/c, would be dependent on others to push w/c, Also needs elevating leg rests to reduce risk of pressure ulcers and LE edema and a pressure relieving  cushion- preferably an air cushion/ROHO  long term to help reduce risk of pressure ulcers along with doing weight shifts every 15-20 minutes.   If any additional questions, call Dr Dagoberto Ligas  LOS: 9 days A FACE TO FACE EVALUATION WAS PERFORMED  Anushri Casalino 07/16/2019, 9:01 AM

## 2019-07-17 ENCOUNTER — Ambulatory Visit (HOSPITAL_COMMUNITY): Payer: No Typology Code available for payment source | Admitting: Physical Therapy

## 2019-07-17 ENCOUNTER — Encounter (HOSPITAL_COMMUNITY): Payer: No Typology Code available for payment source

## 2019-07-17 ENCOUNTER — Inpatient Hospital Stay (HOSPITAL_COMMUNITY): Payer: No Typology Code available for payment source

## 2019-07-17 LAB — CBC WITH DIFFERENTIAL/PLATELET
Abs Immature Granulocytes: 0.18 10*3/uL — ABNORMAL HIGH (ref 0.00–0.07)
Basophils Absolute: 0 10*3/uL (ref 0.0–0.1)
Basophils Relative: 0 %
Eosinophils Absolute: 0 10*3/uL (ref 0.0–0.5)
Eosinophils Relative: 0 %
HCT: 33.1 % — ABNORMAL LOW (ref 39.0–52.0)
Hemoglobin: 10.7 g/dL — ABNORMAL LOW (ref 13.0–17.0)
Immature Granulocytes: 2 %
Lymphocytes Relative: 12 %
Lymphs Abs: 1.2 10*3/uL (ref 0.7–4.0)
MCH: 27.7 pg (ref 26.0–34.0)
MCHC: 32.3 g/dL (ref 30.0–36.0)
MCV: 85.8 fL (ref 80.0–100.0)
Monocytes Absolute: 0.4 10*3/uL (ref 0.1–1.0)
Monocytes Relative: 4 %
Neutro Abs: 8.9 10*3/uL — ABNORMAL HIGH (ref 1.7–7.7)
Neutrophils Relative %: 82 %
Platelets: 235 10*3/uL (ref 150–400)
RBC: 3.86 MIL/uL — ABNORMAL LOW (ref 4.22–5.81)
RDW: 15.9 % — ABNORMAL HIGH (ref 11.5–15.5)
WBC: 10.8 10*3/uL — ABNORMAL HIGH (ref 4.0–10.5)
nRBC: 0 % (ref 0.0–0.2)

## 2019-07-17 LAB — BASIC METABOLIC PANEL
Anion gap: 6 (ref 5–15)
BUN: 20 mg/dL (ref 8–23)
CO2: 27 mmol/L (ref 22–32)
Calcium: 9.6 mg/dL (ref 8.9–10.3)
Chloride: 103 mmol/L (ref 98–111)
Creatinine, Ser: 1.07 mg/dL (ref 0.61–1.24)
GFR calc Af Amer: >60 mL/min (ref >60)
GFR calc non Af Amer: >60 mL/min (ref >60)
Glucose, Bld: 157 mg/dL — ABNORMAL HIGH (ref 70–99)
Potassium: 4.5 mmol/L (ref 3.5–5.1)
Sodium: 136 mmol/L (ref 135–145)

## 2019-07-17 LAB — GLUCOSE, CAPILLARY
Glucose-Capillary: 132 mg/dL — ABNORMAL HIGH (ref 70–99)
Glucose-Capillary: 230 mg/dL — ABNORMAL HIGH (ref 70–99)
Glucose-Capillary: 205 mg/dL — ABNORMAL HIGH (ref 70–99)
Glucose-Capillary: 157 mg/dL — ABNORMAL HIGH (ref 70–99)

## 2019-07-17 MED ORDER — BACLOFEN 5 MG HALF TABLET
5.0000 mg | ORAL_TABLET | Freq: Three times a day (TID) | ORAL | Status: DC
  Administered 2019-07-17 – 2019-07-20 (×10): 5 mg via ORAL
  Filled 2019-07-17 (×10): qty 1

## 2019-07-17 NOTE — Plan of Care (Signed)
Downgraded bed mobility, transfer, and balance goals to min A due to pt's request to shorten LOS. Patient and his family understanding that pt will not reach mod I level due to shortened LOS and that he will be at min A level. Discontinued floor transfer goal due to unsafe to attempt at this time.

## 2019-07-17 NOTE — Progress Notes (Signed)
Recreational Therapy Discharge Summary Patient Details  Name: DONELLE BABA MRN: [id number] Date of Birth: 24-Oct-1950 Today's Date: 07/17/2019  Long term goals set: 2  Long term goals met: 2  Comments on progress toward goals: Pt has made great progress during LOS and is requesting discharge home on 5/31 home with family to provide/coordinate 24 hour supervision/assistance.  TR session focused on activity analysis identifying potential modifications, community reintegration, family roles and involvement in his care (grandchildren), & dynamic balance.  Pt is supervision/set up assistance level for TR tasks.  Pt performed outdoor w/c mobility with PT at supervision level and been provided with education on energy conservation, public restroom access/cath planning, body/safety awareness.  Goals met.  Reasons for discharge: discharge from hospital Patient/family agrees with progress made and goals achieved: Yes  Torben Soloway 07/17/2019, 12:52 PM

## 2019-07-17 NOTE — Plan of Care (Signed)
  Problem: RH BOWEL ELIMINATION Goal: RH STG MANAGE BOWEL WITH ASSISTANCE Description: STG Manage Bowel with min assist  Outcome: Progressing Goal: RH STG MANAGE BOWEL W/MEDICATION W/ASSISTANCE Description: STG Manage Bowel with Medication with min assist Outcome: Progressing   Problem: RH BLADDER ELIMINATION Goal: RH STG MANAGE BLADDER WITH ASSISTANCE Description: STG Manage Bladder independently  Outcome: Progressing

## 2019-07-17 NOTE — Progress Notes (Signed)
Occupational Therapy Session Note  Patient Details  Name: Timothy Davenport MRN: [id number] Date of Birth: [birth year]  Today's Date: 07/17/2019 OT Individual Time: 1000-1100 OT Individual Time Calculation (min): 60 min    Short Term Goals: Week 2:  OT Short Term Goal 1 (Week 2): STG=LTG secondary to ELOS  Skilled Therapeutic Interventions/Progress Updates:    Pt resting in w/c upon arrival.  Wife and son present for education.  Pt independent with directing care and providing instructions to wife and son.  Pt practiced w/c<>bed transfers.  Pt's son assisted after demonstration by therapists.  Pt's son provides appropriate level of assistance and supervision.  Pt performs slide board transfers with close supervision.  Discussed bowel program and schedule upon discharge.  Pt transfers to wide Drop Arm BSC for bowel program after insertion of suppository.  Pt will also perform self catherizations while seated on BSC.  Pt requires additional space of wide drop arm BSC for positioning and clothing management. Pt requires min A for toileting tasks. Pt's son and wife provide appropriate level of assistance and supervision. Pt transferred back to bed and remained in bed with wife and son present.   Therapy Documentation Precautions:  Precautions Precautions: Fall, Back Precaution Booklet Issued: No Precaution Comments: spinal Restrictions Weight Bearing Restrictions: No   Pain: Pain Assessment Pain Scale: 0-10 Pain Score: 0-No pain   Therapy/Group: Individual Therapy  [name] 07/17/2019, 11:54 AM

## 2019-07-17 NOTE — Progress Notes (Signed)
Occupational Therapy Session Note  Patient Details  Name: Timothy Davenport MRN: [id number] Date of Birth: 11-05-1950  Today's Date: 07/17/2019 OT Individual Time: 0700-0800 OT Individual Time Calculation (min): 60 min    Short Term Goals: Week 2:  OT Short Term Goal 1 (Week 2): STG=LTG secondary to ELOS  Skilled Therapeutic Interventions/Progress Updates:    Pt resting in bed upon arrival.  Pt elected to complete bathing/dressing tasks at bed level this morning. See Care Tool for assist levels. Bed mobility with supervision using rails and HOB elevated. Sitting balance with supervision.  Pt able to perform lateral leans to facilitate positioning of slide board.  Slide board transfer to w/c with close supervision. Pt directs care independently and appropriately.   Therapy Documentation Precautions:  Precautions Precautions: Fall, Back Precaution Booklet Issued: No Precaution Comments: spinal Restrictions Weight Bearing Restrictions: No   Pain: Pain Assessment Pain Scale: 0-10 Pain Score: 0-No pain  Therapy/Group: Individual Therapy  [name] 07/17/2019, 11:37 AM

## 2019-07-17 NOTE — Plan of Care (Signed)
  Problem: Sit to Stand Goal: LTG:  Patient will perform sit to stand in prep for activites of daily living with assistance level (OT) Description: LTG:  Patient will perform sit to stand in prep for activites of daily living with assistance level (OT) Flowsheets (Taken 07/17/2019 0829) LTG: PT will perform sit to stand in prep for activites of daily living with assistance level: (d/c goal) -- Note: /dc goal at this time   Problem: RH Bathing Goal: LTG Patient will bathe all body parts with assist levels (OT) Description: LTG: Patient will bathe all body parts with assist levels (OT) Flowsheets (Taken 07/17/2019 0829) LTG: Pt will perform bathing with assistance level/cueing: (downgraded JLS) Minimal Assistance - Patient > 75% Note: downgraded JLS   Problem: RH Dressing Goal: LTG Patient will perform upper body dressing (OT) Description: LTG Patient will perform upper body dressing with assist, with/without cues (OT). Flowsheets (Taken 07/17/2019 0829) LTG: Pt will perform upper body dressing with assistance level of: (downgraded JLS) Contact Guard/Touching assist Note: downgraded JLS Goal: LTG Patient will perform lower body dressing w/assist (OT) Description: LTG: Patient will perform lower body dressing with assist, with/without cues in positioning using equipment (OT) Flowsheets (Taken 07/17/2019 0829) LTG: Pt will perform lower body dressing with assistance level of: (downgraded JLS) Moderate Assistance - Patient 50 - 74% Note: downgraded JLS   Problem: RH Toileting Goal: LTG Patient will perform toileting task (3/3 steps) with assistance level (OT) Description: LTG: Patient will perform toileting task (3/3 steps) with assistance level (OT)  Flowsheets (Taken 07/17/2019 0829) LTG: Pt will perform toileting task (3/3 steps) with assistance level: (downgraded JLS) Minimal Assistance - Patient > 75% Note: downgraded JLS   Problem: RH Simple Meal Prep Goal: LTG Patient will perform  simple meal prep w/assist (OT) Description: LTG: Patient will perform simple meal prep with assistance, with/without cues (OT). Flowsheets (Taken 07/17/2019 0829) LTG: Pt will perform simple meal prep with assistance level of: (d/c goal) -- Note: D/c goal   Problem: RH Tub/Shower Transfers Goal: LTG Patient will perform tub/shower transfers w/assist (OT) Description: LTG: Patient will perform tub/shower transfers with assist, with/without cues using equipment (OT) Flowsheets (Taken 07/17/2019 0829) LTG: Pt will perform tub/shower stall transfers with assistance level of: (downgraded JLS) Minimal Assistance - Patient > 75% Note: Downgraded JLS

## 2019-07-17 NOTE — Progress Notes (Signed)
Occupational Therapy Weekly Progress Note  Patient Details  Name: Timothy Davenport MRN: [id number] Date of Birth: 05/02/1950  Beginning of progress report period: Jul 08, 2019 End of progress report period: Jul 17, 2019  Patient has met 4 of 4 short term goals.  Pt has made steady progress since admission.  Pt performs slide board transfers with CGA/min A, min A for bathing at shower level or sitting EOB, UB dressing with CGA for sitting balance, and mod A for LB dressing. Pt requires min A for dynamic sitting balance.   Patient continues to demonstrate the following deficits: muscle weakness and muscle paralysis, decreased cardiorespiratoy endurance, unbalanced muscle activation and decreased sitting balance, decreased standing balance and decreased balance strategies and therefore will continue to benefit from skilled OT intervention to enhance overall performance with BADL and Reduce care partner burden.  Patient not progressing toward long term goals.  See goal revision..  Plan of care revisions: see goals.  OT Short Term Goals Week 1:  OT Short Term Goal 1 (Week 1): Pt will don shirt in unsupported sitting with min A OT Short Term Goal 1 - Progress (Week 1): Met OT Short Term Goal 2 (Week 1): Pt will thread brief and pants with min A OT Short Term Goal 2 - Progress (Week 1): Met OT Short Term Goal 3 (Week 1): Pt will perform slide board transfer to drop arm commode with contact guard OT Short Term Goal 3 - Progress (Week 1): Met OT Short Term Goal 4 (Week 1): Pt will perform shower transfer with LRAD with mod A OT Short Term Goal 4 - Progress (Week 1): Met Week 2:  OT Short Term Goal 1 (Week 2): STG=LTG secondary to ELOS   [name] 07/17/2019, 6:51 AM

## 2019-07-17 NOTE — Progress Notes (Signed)
Physical Therapy Session Note  Patient Details  Name: Timothy Davenport MRN: [id number] Date of Birth: 18-Jul-1950  Today's Date: 07/17/2019 PT Individual Time: 1100-1200 PT Individual Time Calculation (min): 60 min   Short Term Goals: Week 2:  PT Short Term Goal 1 (Week 2): =LTG due to ELOS  Skilled Therapeutic Interventions/Progress Updates:    Pt received seated in bed with family present for hands-on education session, handoff from OT session. No complaints of pain. Reviewed how to don/doff leg loops and provided handout for where to purchase leg loops online. Pt able to direct his wife through how to assist him from supine to sitting EOB then how to safely perform a slide board transfer to w/c. Pt is min A for bed mobility and SB transfer. Pt is at mod I level for w/c mobility up to 200 ft with use of BUE. Pt's son is able to provide his car for car transfer this session. Pt is able to perform car transfer to sedan with min A. Pt's family able to perform return demo and demonstrates good understanding of how to setup transfer safely and assist pt with this type of transfer. Reviewed how to bump a w/c up/down one curb step to enter the home. Pt and his family demonstrate good understanding of how to setup w/c and assist pt with this type of transfer. Pt left seated in w/c in room with needs in reach, family present at end of session.  Therapy Documentation Precautions:  Precautions Precautions: Fall, Back Precaution Booklet Issued: No Precaution Comments: spinal Restrictions Weight Bearing Restrictions: No    Therapy/Group: Individual Therapy   Excell Seltzer, PT, DPT  07/17/2019, 12:47 PM

## 2019-07-17 NOTE — Progress Notes (Addendum)
[facility] PHYSICAL MEDICINE & REHABILITATION PROGRESS NOTE   Subjective/Complaints:   Pt reports spasms still bothering him/annoying and sometimes lasts longer than before.   Also asking about sexuality/intimacy "talk" when wife can be here- already went over in depth yesterday, however he appropriately wants wife to hear as well.   Interested in meds for spasms.   ROS:  Pt denies SOB, abd pain, CP, N/V/C/D, and vision changes   Objective:   No results found. Recent Labs    07/17/19 0628  WBC 10.8*  HGB 10.7*  HCT 33.1*  PLT 235   Recent Labs    07/17/19 0628  NA 136  K 4.5  CL 103  CO2 27  GLUCOSE 157*  BUN 20  CREATININE 1.07  CALCIUM 9.6    Intake/Output Summary (Last 24 hours) at 07/17/2019 0936 Last data filed at 07/17/2019 0730 Gross per 24 hour  Intake 1320 ml  Output 2560 ml  Net -1240 ml     Physical Exam: Vital Signs Blood pressure 110/81, pulse 88, temperature 98 F (36.7 C), temperature source Oral, resp. rate 18, height 5\' 10"  (1.778 m), weight 79.8 kg, SpO2 100 %.  Physical Exam  Constitutional: sitting up working with OT to get shorts on - doing well, but slow- NAD HENT: conjugate gaze R eye additional skin tag- medial lower eye  Cardiovascular: RRR Respiratory: CTA B/L- no W/R/R- good air movement GI: Soft, NT, ND, (+)BS     Musculoskeletal:        General: No edema.     Comments: UEs deltoids, biceps, triceps, WE, grip and finger abd 5/5 B/L 0/5 in HF, KE, KF, DF and PF B/L - no change Neurological: Ox3 Mildly decreased sensation reduction from  L1 to S5 B/L- is a little better on RLE however pt said it's "mild"- can feel everything from "head to bottom" No hoffman's B/L Multiple spasms seen in LEs- again today- seen Skin: intact; clean, dry Psychiatric:appropriate   Assessment/Plan: 1. Functional deficits secondary to T8 paraplegia ASIA B which require 3+ hours per day of interdisciplinary therapy in a comprehensive inpatient  rehab setting.  Physiatrist is providing close team supervision and 24 hour management of active medical problems listed below.  Physiatrist and rehab team continue to assess barriers to discharge/monitor patient progress toward functional and medical goals  Care Tool:  Bathing    Body parts bathed by patient: Right arm, Left arm, Chest, Abdomen, Front perineal area, Right upper leg, Left upper leg, Right lower leg, Left lower leg, Face   Body parts bathed by helper: Front perineal area, Buttocks, Left lower leg, Right lower leg     Bathing assist Assist Level: Minimal Assistance - Patient > 75%(supported in bed)     Upper Body Dressing/Undressing Upper body dressing Upper body dressing/undressing activity did not occur (including orthotics): N/A What is the patient wearing?: Pull over shirt    Upper body assist Assist Level: Minimal Assistance - Patient > 75%    Lower Body Dressing/Undressing Lower body dressing      What is the patient wearing?: Pants     Lower body assist Assist for lower body dressing: Minimal Assistance - Patient > 75%     Toileting Toileting    Toileting assist Assist for toileting: Total Assistance - Patient < 25%     Transfers Chair/bed transfer  Transfers assist  Chair/bed transfer activity did not occur: N/A  Chair/bed transfer assist level: Minimal Assistance - Patient > 75%  Locomotion Ambulation   Ambulation assist   Ambulation activity did not occur: Safety/medical concerns          Walk 10 feet activity   Assist  Walk 10 feet activity did not occur: Safety/medical concerns        Walk 50 feet activity   Assist Walk 50 feet with 2 turns activity did not occur: Safety/medical concerns         Walk 150 feet activity   Assist Walk 150 feet activity did not occur: Safety/medical concerns         Walk 10 feet on uneven surface  activity   Assist Walk 10 feet on uneven surfaces activity did not  occur: Safety/medical concerns         Wheelchair     Assist Will patient use wheelchair at discharge?: Yes Type of Wheelchair: Manual    Wheelchair assist level: Independent Max wheelchair distance: 150'    Wheelchair 50 feet with 2 turns activity    Assist        Assist Level: Independent   Wheelchair 150 feet activity     Assist      Assist Level: Independent   Blood pressure 110/81, pulse 88, temperature 98 F (36.7 C), temperature source Oral, resp. rate 18, height 5\' 10"  (1.778 m), weight 79.8 kg, SpO2 100 %.   Medical Problem List and Plan: 1.  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              -patient may  shower             -ELOS/Goals: 3-3.5 weeks- goals Mod I to min assist  5/23- pt wants to leave, if possible, by Friday- explained likely 3 weeks from rehab admission  5/25- pt really wants to leave memorial day- d/c staples  5/26- trying to send home 5/31 if possible- wil make final call Friday this week if possible  5/27- discussed sexuality/intimacy in depth this AM- 20 minutes on topic- suggest getting [facility] to increase Cialis to max dose 5/28- PT also wants me to go over sexuality with wife.   -Continue CIR 2.  Antithrombotics: -DVT/anticoagulation:  Mechanical: Sequential compression devices, below knee Bilateral lower extremities Dopplers today were negative.   -need to determine when OK to start Lovenox- due to high risk of DVT. 5/20- have called [facility] to see when can start- since had hematoma 5/22- hadn't heard back- will call Monday 5/24: reconsulted NSGY on this issue today 5/25- on Lovenox now- will need x 3 months total from surgery             -antiplatelet therapy: N/A--has been off Plavix.  3. Pain Management: Continue gabapentin tid with oxycodone prn. Well controlled 4. Mood: LCSW to follow for evaluation and support.               -antipsychotic agents: N/a 5. Neuropsych: This patient is capable of making decisions on his own behalf. 6. Skin/Wound Care: Routine pressure relief measures.  Discussed with pt in depth how necessary pressure relief every 15-20 minutes is essential to prevent pressure ulcers and morbidity/complications. 7. Fluids/Electrolytes/Nutrition: Monitor I/O. Check lytes in am.  8. Cord compression:On decadron 4 mg IV every 6 hours-->will start taper  5/23- on PO taper- daily as of tomorrow x 3 days  5/27- off Decadrone 9. HTN: Monitor BP tid--continue Norvasc, Cozaar and hydralazine.   10. T2DM: Monitor  BS ac/hs. On Glucotrol daily with SSI for elevated BS- likely made worse by Decadron.    CBG (last 3)  Recent Labs    07/16/19 1206 07/16/19 2100 07/17/19 0621  GLUCAP 154* 247* 132*        5/27- last dose of Decadron- hopefully will improve some on its own  5/28- BG's better this AM since last dose of decadron yesterday 11. Neurogenic bladder: Continue Flomax. Discussed at length Foley vs in/out caths- has foley- asked pt and wife to discuss long term what would work best for them.  5/19- thinking about foley vs in/out caths  5/20- will remove foley and teach pt in/out caths asap per his request    5/22- learning today how to do in/out caths- appropriate and doing well per nursing  5/24- doing own caths x 48 hours  5/25- doing own caths  5/28- no issues- doing well per nursing 12. Hematochezia with Neurogenic bowel: Last BM 5/15 with bloody streaks. Will order KUB to evaluate stool burden. Continue Senna S. Will administer Miralax and enema today. Then start on bowel program nightly after dinner..   5/27- results with bowel program  5/28- doing own bowel program- getting results 13. CKD Stage IIIa: Will order post op labs in am to monitor lytes/renal status.  5/19- Cr 1.10 this AM 14. Leucocytosis / UTI- pseduomonasI: Question/likely due to steroids. Monitor for signs of infection.  Will check labs in am.  5/19- WBC 13.2- but still on decadron- no signs of infection-will monitor   5/21- WBC 30k!- low grade temp- Tm100.3- started on Keflex for likely UTI based on U/A results.   -will give 1 dose Rocephin considering WBC is 30k- just to make sure he's well covered.  5/22- Will change to Cetazidim 1G q8 hourse since WBC only slightly improved to 28k- stop Keflex- is pseudomonas- will double check with pharmacy on correct ABX  5/23- On Ceftazidime- 1G q8 hours- WBC down to 22k- doing better- con't IV ABX x 7 days total  5/25- end date 5/28-  5/27- labs in AM  5/28- finishing IV ABX today- WBC down to 10.8 15. ABLA: Last H/H checked prior to hematoma evacuation on 05/13-->recheck post op CBC in am.  5/19- Hb stable, however platelets down to 141 - will monitor closely.    5/21- Plts 129- down from 141k - will monitor closely  5/23- recheck labs Monday- monitor platelets especially, considering had hematoma/at risk for DVT.  5/25- Plts 230- doing much better 16. Hypokalemia: Last K+-3.1 --will recheck in am.  5/19- K+ 4.2- con't to monitor  17. GERD-   5/21- pt c/o Acid reflux Sx's- will order protonix in evening.   5/22- Sx's better- con't Protonix 18. Dry eyes  5/25- start tears drop QID and prn 19. Spasticity  5/28- will start Baclofen 5 mg TID per pt request- for annoying spasms.   20. Patient is a T8 paraplegia ASIA B- will absolutely NEED ULTRA light weight rigid manual w/c due to his paraplegia, impaired sensation, and need at 69 yrs old to be able to propel w/c at community distances- if had a heavier w/c, would be dependent on others to push w/c, Also needs elevating leg rests to reduce risk of pressure ulcers and LE edema and a pressure relieving cushion- preferably an air cushion/ROHO long term to help reduce risk of pressure ulcers along with doing weight shifts every 15-20 minutes.   If any additional questions, call Dr Dagoberto Ligas  LOS: 10 days  A FACE TO FACE  EVALUATION WAS PERFORMED  Levander Katzenstein 07/17/2019, 9:36 AM

## 2019-07-17 NOTE — Progress Notes (Signed)
Occupational Therapy Discharge Summary  Patient Details  Name: Timothy Davenport MRN: [id number] Date of Birth: Apr 23, 1950  Patient has met 7 of 7 long term goals due to improved activity tolerance, improved balance and ability to compensate for deficits.  Pt made steady progress with BADLs and functional transfers durng this admission.  Pt completes bathing/dressing at bed level with min/mod A. Slide board transfers with CGA/supervision.  Pt performs tub transfer bench transfers with min A. Pt requested to discharge on [facility] Day.  Wife and sons have been present for threapy and provide the appropriate level of assistance/supervison.  Pt independent with directing care and requesting assistance appropriately. Patient to discharge at [facility] Assist level.  Patient's care partner is independent to provide the necessary physical assistance at discharge.     Recommendation:  Patient will benefit from ongoing skilled OT services in home health setting to continue to advance functional skills in the area of BADL.  Equipment: drop arm BSC; already owns tub bench  Reasons for discharge: treatment goals met and discharge from hospital  Patient/family agrees with progress made and goals achieved: Yes  OT Discharge Perception  Perception: Within Functional Limits Praxis Praxis: Intact Cognition Overall Cognitive Status: Within Functional Limits for tasks assessed Arousal/Alertness: Awake/alert Orientation Level: Oriented X4 Attention: Focused;Sustained Focused Attention: Appears intact Sustained Attention: Appears intact Memory: Appears intact Immediate Memory Recall: Blue;Bed;Sock Memory Recall Sock: Without Cue Memory Recall Blue: Without Cue Memory Recall Bed: Without Cue Awareness: Appears intact Problem Solving: Appears intact Safety/Judgment: Appears intact Sensation Sensation Light Touch: Appears Intact(BUE) Hot/Cold: Appears Intact(BUE) Proprioception: Appears  Intact(BUE) Coordination Gross Motor Movements are Fluid and Coordinated: Yes Fine Motor Movements are Fluid and Coordinated: Yes Finger Nose Finger Test: intact Motor  Motor Motor: Paraplegia;Abnormal postural alignment and control Trunk/Postural Assessment  Cervical Assessment Cervical Assessment: Within Functional Limits Thoracic Assessment Thoracic Assessment: (forward posture-post sx) Lumbar Assessment Lumbar Assessment: (posterior pelvic tilt) Postural Control Trunk Control: impaired due to weakness, unable to correct post pelvic tilt, able to partially extend thoracic spine and retract shoulder blades w/cues, difficulty maintainning  Balance Static Sitting Balance Static Sitting - Balance Support: Bilateral upper extremity supported Static Sitting - Level of Assistance: 5: Stand by assistance Dynamic Sitting Balance Dynamic Sitting - Balance Support: During functional activity Dynamic Sitting - Level of Assistance: 4: Min assist Extremity/Trunk Assessment RUE Assessment RUE Assessment: Within Functional Limits LUE Assessment LUE Assessment: Within Functional Limits   [name] 07/17/2019, 2:51 PM

## 2019-07-17 NOTE — Progress Notes (Addendum)
Patient ID: Timothy Davenport, male   DOB: Apr 07, 1950, 69 y.o.   MRN: [id number]  SW received return call from [place] [phone])  who received fax, and inquired about incontinence supplies needed for pt.   SW scheduled ambulance pick up on Monday, 5/31 at 12pm for d/c to home.   *SW updated pt on ambulance pick up time. SW informed medical team.  Pt also reports he has had some follow-up from [place] on items needed. Pt also asked about loaner w/c from [employer] and   SW followed-up with [facility] to discuss incontinence supplies and catheters. SW waiting on follow-up on status.   Updates from [place] states that there will be follow-up with pt on Tuesday to discuss when loaner w/c will be delivered to the home. SW called pt to inform. He states that he does have a wheelchair at home he can use until the loaner is delivered.   *Contact from [employer] states their team spoke with pt   about catheters/incontinence supplies.  Loralee Pacas, MSW, [place] Office: [phone] Cell: [phone] Fax: [phone]

## 2019-07-18 ENCOUNTER — Inpatient Hospital Stay (HOSPITAL_COMMUNITY): Payer: No Typology Code available for payment source

## 2019-07-18 LAB — GLUCOSE, CAPILLARY
Glucose-Capillary: 142 mg/dL — ABNORMAL HIGH (ref 70–99)
Glucose-Capillary: 138 mg/dL — ABNORMAL HIGH (ref 70–99)
Glucose-Capillary: 153 mg/dL — ABNORMAL HIGH (ref 70–99)
Glucose-Capillary: 191 mg/dL — ABNORMAL HIGH (ref 70–99)

## 2019-07-18 NOTE — Progress Notes (Signed)
[place] PHYSICAL MEDICINE & REHABILITATION PROGRESS NOTE   Subjective/Complaints:    ROS:  Pt denies SOB, abd pain, CP, N/V/C/D, and vision changes   Objective:   No results found. Recent Labs    07/17/19 0628  WBC 10.8*  HGB 10.7*  HCT 33.1*  PLT 235   Recent Labs    07/17/19 0628  NA 136  K 4.5  CL 103  CO2 27  GLUCOSE 157*  BUN 20  CREATININE 1.07  CALCIUM 9.6    Intake/Output Summary (Last 24 hours) at 07/18/2019 0926 Last data filed at 07/18/2019 0800 Gross per 24 hour  Intake 240 ml  Output 3003 ml  Net -2763 ml     Physical Exam: Vital Signs Blood pressure 95/60, pulse 87, temperature 98.1 F (36.7 C), resp. rate 16, height 5\' 10"  (1.778 m), weight 79.8 kg, SpO2 100 %.  Physical Exam   General: No acute distress Mood and affect are appropriate Heart: Regular rate and rhythm no rubs murmurs or extra sounds Lungs: Clear to auscultation, breathing unlabored, no rales or wheezes Abdomen: Positive bowel sounds, soft nontender to palpation, nondistended Extremities: No clubbing, cyanosis, or edema Skin: No evidence of breakdown, no evidence of rash     Comments: UEs deltoids, biceps, triceps, WE, grip and finger abd 5/5 B/L 0/5 in HF, KE, KF, DF and PF B/L - no change Neurological: Ox3 Mildly decreased sensation reduction from  L1 to S5 B/L- is a little better on RLE however pt said it's "mild"- can feel everything from "head to bottom" No hoffman's B/L Multiple spasms seen in LEs- again today- seen Skin: intact; clean, dry Psychiatric:appropriate   Assessment/Plan: 1. Functional deficits secondary to T8 paraplegia ASIA B which require 3+ hours per day of interdisciplinary therapy in a comprehensive inpatient rehab setting.  Physiatrist is providing close team supervision and 24 hour management of active medical problems listed below.  Physiatrist and rehab team continue to assess barriers to discharge/monitor patient progress toward  functional and medical goals  Care Tool:  Bathing    Body parts bathed by patient: Right arm, Left arm, Chest, Abdomen, Front perineal area, Right upper leg, Left upper leg, Right lower leg, Left lower leg, Face(bed level)   Body parts bathed by helper: Buttocks     Bathing assist Assist Level: Minimal Assistance - Patient > 75%     Upper Body Dressing/Undressing Upper body dressing Upper body dressing/undressing activity did not occur (including orthotics): N/A What is the patient wearing?: Pull over shirt    Upper body assist Assist Level: Contact Guard/Touching assist    Lower Body Dressing/Undressing Lower body dressing      What is the patient wearing?: Pants, Incontinence brief     Lower body assist Assist for lower body dressing: Minimal Assistance - Patient > 75%     Toileting Toileting    Toileting assist Assist for toileting: Minimal Assistance - Patient > 75%     Transfers Chair/bed transfer  Transfers assist  Chair/bed transfer activity did not occur: N/A  Chair/bed transfer assist level: Minimal Assistance - Patient > 75%     Locomotion Ambulation   Ambulation assist   Ambulation activity did not occur: Safety/medical concerns          Walk 10 feet activity   Assist  Walk 10 feet activity did not occur: Safety/medical concerns        Walk 50 feet activity   Assist Walk 50 feet with 2 turns activity did  not occur: Safety/medical concerns         Walk 150 feet activity   Assist Walk 150 feet activity did not occur: Safety/medical concerns         Walk 10 feet on uneven surface  activity   Assist Walk 10 feet on uneven surfaces activity did not occur: Safety/medical concerns         Wheelchair     Assist Will patient use wheelchair at discharge?: Yes Type of Wheelchair: Manual    Wheelchair assist level: Independent Max wheelchair distance: 150'    Wheelchair 50 feet with 2 turns  activity    Assist        Assist Level: Independent   Wheelchair 150 feet activity     Assist      Assist Level: Independent   Blood pressure 95/60, pulse 87, temperature 98.1 F (36.7 C), resp. rate 16, height 5\' 10"  (1.778 m), weight 79.8 kg, SpO2 100 %.   Medical Problem List and Plan: 1.  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              -patient may  shower             -ELOS/Goals: 3-3.5 weeks- goals Mod I to min assist  5/23- pt wants to leave, if possible, by Friday- explained likely 3 weeks from rehab admission  5/25- pt really wants to leave memorial day- d/c staples  5/26- trying to send home 5/31 if possible- wil make final call Friday this week if possible  5/27- discussed sexuality/intimacy in depth this AM- 20 minutes on topic- suggest getting [facility] to increase Cialis to max dose 5/28- PT also wants me to go over sexuality with wife.   -Continue CIR 2.  Antithrombotics: -DVT/anticoagulation:  Mechanical: Sequential compression devices, below knee Bilateral lower extremities Dopplers today were negative.   -need to determine when OK to start Lovenox- due to high risk of DVT. 5/20- have called [facility] to see when can start- since had hematoma 5/22- hadn't heard back- will call Monday 5/24: reconsulted NSGY on this issue today 5/25- on Lovenox now- will need x 3 months total from surgery             -antiplatelet therapy: N/A--has been off Plavix.  3. Pain Management: Continue gabapentin tid with oxycodone prn. Well controlled 4. Mood: LCSW to follow for evaluation and support.              -antipsychotic agents: N/a 5. Neuropsych: This patient is capable of making decisions on his own behalf. 6. Skin/Wound Care: Routine pressure relief measures.  Discussed with pt in depth how necessary pressure relief every 15-20 minutes is essential to  prevent pressure ulcers and morbidity/complications. 7. Fluids/Electrolytes/Nutrition: Monitor I/O. Check lytes in am.  8. Cord compression:On decadron 4 mg IV every 6 hours-->will start taper  5/23- on PO taper- daily as of tomorrow x 3 days  5/27- off Decadrone 9. HTN: Monitor BP tid--continue Norvasc, Cozaar and hydralazine.   Vitals:   07/18/19 0320 07/18/19 0818  BP: 102/81 95/60  Pulse: 78 87  Resp: 16   Temp: 98.1 F (36.7 C)   SpO2: 100%   BPs running on low side will write parameters on hydralazine  10. T2DM: Monitor BS ac/hs. On Glucotrol daily with SSI for elevated BS- likely made worse by Decadron.    CBG (last 3)  Recent Labs    07/17/19 1631 07/17/19 2101 07/18/19 0623  GLUCAP 205* 157* 142*    fair control no med changes     5/27- last dose of Decadron- hopefully will improve some on its own  5/28- BG's better this AM since last dose of decadron yesterday 11. Neurogenic bladder: Continue Flomax. Discussed at length Foley vs in/out caths- has foley- asked pt and wife to discuss long term what would work best for them.  5/19- thinking about foley vs in/out caths  5/20- will remove foley and teach pt in/out caths asap per his request    5/22- learning today how to do in/out caths- appropriate and doing well per nursing  5/24- doing own caths x 48 hours  5/25- doing own caths  5/28- no issues- doing well per nursing 12. Hematochezia with Neurogenic bowel: Last BM 5/15 with bloody streaks. Will order KUB to evaluate stool burden. Continue Senna S. Will administer Miralax and enema today. Then start on bowel program nightly after dinner..   5/27- results with bowel program  5/28- doing own bowel program- getting results 13. CKD Stage IIIa: Will order post op labs in am to monitor lytes/renal status.  5/19- Cr 1.10 this AM 14. Leucocytosis / UTI- pseduomonasI: Question/likely due to steroids. Monitor for signs of infection. Will check labs in am.  5/19- WBC 13.2- but  still on decadron- no signs of infection-will monitor   5/21- WBC 30k!- low grade temp- Tm100.3- started on Keflex for likely UTI based on U/A results.   -will give 1 dose Rocephin considering WBC is 30k- just to make sure he's well covered.  5/22- Will change to Cetazidim 1G q8 hourse since WBC only slightly improved to 28k- stop Keflex- is pseudomonas- will double check with pharmacy on correct ABX  5/23- On Ceftazidime- 1G q8 hours- WBC down to 22k- doing better- con't IV ABX x 7 days total  5/25- end date 5/28-  5/27- labs in AM  5/28- finishing IV ABX today- WBC down to 10.8 15. ABLA: Last H/H checked prior to hematoma evacuation on 05/13-->recheck post op CBC in am.  5/19- Hb stable, however platelets down to 141 - will monitor closely.    5/21- Plts 129- down from 141k - will monitor closely  5/23- recheck labs Monday- monitor platelets especially, considering had hematoma/at risk for DVT.  5/25- Plts 230- doing much better 16. Hypokalemia: Last K+-3.1 --will recheck in am.  5/19- K+ 4.2- con't to monitor  17. GERD-   5/21- pt c/o Acid reflux Sx's- will order protonix in evening.   5/22- Sx's better- con't Protonix 18. Dry eyes  5/25- start tears drop QID and prn 19. Spasticity  5/28- will start Baclofen 5 mg TID per pt request- for annoying spasms.   20. Patient is a T8 paraplegia ASIA B- will absolutely NEED ULTRA light weight rigid manual w/c due to his paraplegia, impaired sensation, and need at 69 yrs old to be able to propel w/c at community distances- if had a heavier w/c, would be dependent on others to push w/c, Also needs elevating leg rests to reduce risk of pressure ulcers and LE edema and a pressure relieving cushion- preferably an air cushion/ROHO long term to help reduce risk of pressure ulcers along with doing weight shifts every 15-20 minutes.   If any additional questions, call Dr Dagoberto Ligas  LOS: 11 days A FACE TO [facility] 07/18/2019, 9:26 AM

## 2019-07-18 NOTE — Progress Notes (Signed)
Patient education done regarding lovenox injection. Patient able to do administration successfully. Patient has been consistently performing him own intermittent catheterization. Education reinforced regarding order of clean technique. Bowel program started about 1900. Patient has has 3 bowel movements today. Patient performed dig stim and inserted suppository. Small amount of soft mushy stool noted. Patient required assistance turning and getting packaging opened. Patient's wife was at bedside and education done with her as well. Al questions answered. Reported off to night shift .

## 2019-07-18 NOTE — Progress Notes (Signed)
Pt is in bowel program, pt dig stim self twice after suppository was given. Results were a medium, brown, mushy type stool. No abdominal distention or pain.

## 2019-07-18 NOTE — Plan of Care (Signed)
  Problem: RH BOWEL ELIMINATION Goal: RH STG MANAGE BOWEL WITH ASSISTANCE Description: STG Manage Bowel with min assist  Outcome: Progressing Goal: RH STG MANAGE BOWEL W/MEDICATION W/ASSISTANCE Description: STG Manage Bowel with Medication with min assist Outcome: Progressing   Problem: RH BLADDER ELIMINATION Goal: RH STG MANAGE BLADDER WITH ASSISTANCE Description: STG Manage Bladder independently  Outcome: Progressing   Problem: RH SKIN INTEGRITY Goal: RH STG SKIN FREE OF INFECTION/BREAKDOWN Outcome: Progressing Goal: RH STG ABLE TO PERFORM INCISION/WOUND CARE W/ASSISTANCE Description: STG Able To Perform Incision/Wound Care With mod I Outcome: Progressing   Problem: RH SAFETY Goal: RH STG ADHERE TO SAFETY PRECAUTIONS W/ASSISTANCE/DEVICE Description: STG Adhere to Safety Precautions With min assist Outcome: Progressing Goal: RH STG DECREASED RISK OF FALL WITH ASSISTANCE Description: STG Decreased Risk of Fall With mod I Assistance. Outcome: Progressing   Problem: RH PAIN MANAGEMENT Goal: RH STG PAIN MANAGED AT OR BELOW PT'S PAIN GOAL Description: Pain score of 4 or less Outcome: Progressing   Problem: RH KNOWLEDGE DEFICIT GENERAL Goal: RH STG INCREASE KNOWLEDGE OF SELF CARE AFTER HOSPITALIZATION Outcome: Progressing   Problem: Consults Goal: RH SPINAL CORD INJURY PATIENT EDUCATION Description:  See Patient Education module for education specifics.  Outcome: Progressing   Problem: Consults Goal: Skin Care Protocol Initiated - if Braden Score 18 or less Description: If consults are not indicated, leave blank or document N/A Outcome: Progressing Goal: Diabetes Guidelines if Diabetic/Glucose > 140 Description: If diabetic or lab glucose is > 140 mg/dl - Initiate Diabetes/Hyperglycemia Guidelines & Document Interventions  Outcome: Progressing   Problem: SCI BOWEL ELIMINATION Goal: RH STG MANAGE BOWEL WITH ASSISTANCE Description: STG Manage Bowel with mod  Assistance. Outcome: Progressing Goal: RH STG SCI MANAGE BOWEL WITH MEDICATION WITH ASSISTANCE Description: STG SCI Manage bowel with medication with min assistance. Outcome: Progressing Goal: RH STG SCI MANAGE BOWEL PROGRAM W/ASSIST OR AS APPROPRIATE Description: STG SCI Manage bowel program with mod assist or as appropriate. Outcome: Progressing   Problem: SCI BLADDER ELIMINATION Goal: RH STG MANAGE BLADDER WITH ASSISTANCE Description: STG Manage Bladder independently. Outcome: Progressing Goal: RH STG MANAGE BLADDER WITH MEDICATION WITH ASSISTANCE Description: STG Manage Bladder With Medication With Supervision. Outcome: Progressing   Problem: RH SKIN INTEGRITY Goal: RH STG SKIN FREE OF INFECTION/BREAKDOWN Description: Skin to remain free from infection and breakdown while on rehab with min assist from staff. Outcome: Progressing Goal: RH STG MAINTAIN SKIN INTEGRITY WITH ASSISTANCE Description: STG Maintain Skin Integrity Mod I. Outcome: Progressing Goal: RH STG ABLE TO PERFORM INCISION/WOUND CARE W/ASSISTANCE Description: STG Able To Perform Incision/Wound Care Mod I. Outcome: Progressing   Problem: RH SAFETY Goal: RH STG ADHERE TO SAFETY PRECAUTIONS W/ASSISTANCE/DEVICE Description: STG Adhere to Safety Precautions With min Assistance and appropriate assistive equipment. Outcome: Progressing Goal: RH STG DECREASED RISK OF FALL WITH ASSISTANCE Description: STG Decreased Risk of Fall With min Assistance. Outcome: Progressing   Problem: RH PAIN MANAGEMENT Goal: RH STG PAIN MANAGED AT OR BELOW PT'S PAIN GOAL Description: <4 on a 0-10 pain scale. Outcome: Progressing   Problem: RH KNOWLEDGE DEFICIT SCI Goal: RH STG INCREASE KNOWLEDGE OF SELF CARE AFTER SCI Description: Patient and caregiver will demonstrate knowledge of medication management, bowel/bladder program, safety precautions, and follow up care with the MD post discharge with min assist from [facility] staff. Outcome:  Progressing

## 2019-07-18 NOTE — Plan of Care (Signed)
  Problem: RH BOWEL ELIMINATION Goal: RH STG MANAGE BOWEL WITH ASSISTANCE Description: STG Manage Bowel with min assist  Outcome: Progressing Goal: RH STG MANAGE BOWEL W/MEDICATION W/ASSISTANCE Description: STG Manage Bowel with Medication with min assist Outcome: Progressing   Problem: RH BLADDER ELIMINATION Goal: RH STG MANAGE BLADDER WITH ASSISTANCE Description: STG Manage Bladder independently  Outcome: Progressing   Problem: RH SKIN INTEGRITY Goal: RH STG SKIN FREE OF INFECTION/BREAKDOWN Outcome: Progressing Goal: RH STG ABLE TO PERFORM INCISION/WOUND CARE W/ASSISTANCE Description: STG Able To Perform Incision/Wound Care With mod I Outcome: Progressing   Problem: RH SAFETY Goal: RH STG ADHERE TO SAFETY PRECAUTIONS W/ASSISTANCE/DEVICE Description: STG Adhere to Safety Precautions With min assist Outcome: Progressing Goal: RH STG DECREASED RISK OF FALL WITH ASSISTANCE Description: STG Decreased Risk of Fall With mod I Assistance. Outcome: Progressing   Problem: RH PAIN MANAGEMENT Goal: RH STG PAIN MANAGED AT OR BELOW PT'S PAIN GOAL Description: Pain score of 4 or less Outcome: Progressing   Problem: RH KNOWLEDGE DEFICIT GENERAL Goal: RH STG INCREASE KNOWLEDGE OF SELF CARE AFTER HOSPITALIZATION Outcome: Progressing   Problem: Consults Goal: RH SPINAL CORD INJURY PATIENT EDUCATION Description:  See Patient Education module for education specifics.  Outcome: Progressing   Problem: Consults Goal: Skin Care Protocol Initiated - if Braden Score 18 or less Description: If consults are not indicated, leave blank or document N/A Outcome: Progressing Goal: Diabetes Guidelines if Diabetic/Glucose > 140 Description: If diabetic or lab glucose is > 140 mg/dl - Initiate Diabetes/Hyperglycemia Guidelines & Document Interventions  Outcome: Progressing   Problem: SCI BOWEL ELIMINATION Goal: RH STG MANAGE BOWEL WITH ASSISTANCE Description: STG Manage Bowel with mod  Assistance. Outcome: Progressing Goal: RH STG SCI MANAGE BOWEL WITH MEDICATION WITH ASSISTANCE Description: STG SCI Manage bowel with medication with min assistance. Outcome: Progressing Goal: RH STG SCI MANAGE BOWEL PROGRAM W/ASSIST OR AS APPROPRIATE Description: STG SCI Manage bowel program with mod assist or as appropriate. Outcome: Progressing   Problem: SCI BLADDER ELIMINATION Goal: RH STG MANAGE BLADDER WITH ASSISTANCE Description: STG Manage Bladder independently. Outcome: Progressing Goal: RH STG MANAGE BLADDER WITH MEDICATION WITH ASSISTANCE Description: STG Manage Bladder With Medication With Supervision. Outcome: Progressing   Problem: RH SKIN INTEGRITY Goal: RH STG SKIN FREE OF INFECTION/BREAKDOWN Description: Skin to remain free from infection and breakdown while on rehab with min assist from staff. Outcome: Progressing Goal: RH STG MAINTAIN SKIN INTEGRITY WITH ASSISTANCE Description: STG Maintain Skin Integrity Mod I. Outcome: Progressing Goal: RH STG ABLE TO PERFORM INCISION/WOUND CARE W/ASSISTANCE Description: STG Able To Perform Incision/Wound Care Mod I. Outcome: Progressing   Problem: RH SAFETY Goal: RH STG ADHERE TO SAFETY PRECAUTIONS W/ASSISTANCE/DEVICE Description: STG Adhere to Safety Precautions With min Assistance and appropriate assistive equipment. Outcome: Progressing Goal: RH STG DECREASED RISK OF FALL WITH ASSISTANCE Description: STG Decreased Risk of Fall With min Assistance. Outcome: Progressing   Problem: RH PAIN MANAGEMENT Goal: RH STG PAIN MANAGED AT OR BELOW PT'S PAIN GOAL Description: <4 on a 0-10 pain scale. Outcome: Progressing   Problem: RH KNOWLEDGE DEFICIT SCI Goal: RH STG INCREASE KNOWLEDGE OF SELF CARE AFTER SCI Description: Patient and caregiver will demonstrate knowledge of medication management, bowel/bladder program, safety precautions, and follow up care with the MD post discharge with min assist from [facility] staff. Outcome:  Progressing

## 2019-07-18 NOTE — Progress Notes (Signed)
Occupational Therapy Session Note  Patient Details  Name: Timothy Davenport MRN: [id number] Date of Birth: 1950/06/01  Today's Date: 07/18/2019 OT Individual Time: [id number] OT Individual Time Calculation (min): 30 min  and Today's Date: 07/18/2019 OT Missed Time: 30 Minutes Missed Time Reason: Patient fatigue   Short Term Goals: Week 2:  OT Short Term Goal 1 (Week 2): STG=LTG secondary to ELOS  Skilled Therapeutic Interventions/Progress Updates:    1:1. Pt received in bed agreeable to OT, but declining OOB therapy d/t having "a me day and I dont really want to put on pants to take them off again." Pt agreeable to 30 min EOB and bed level tx. Pt completes supine>sitting wit close guarding A and completes seated bean bag toss at EOB with CGA-MIN A for sitting balance to improve trunk control in mod ranges outside BOS. Pt completes supine UB therex focusing on shoulders, chest and triceps in prep for functional transfers: shoulder flex/ext, chest press, and skull crushers (tricep ext against gravity) 2x12 with 14# dowel rod. Exited sesion with pt missing 30 min skilled OT d/t fatigue  Therapy Documentation Precautions:  Precautions Precautions: Fall, Back Precaution Booklet Issued: No Precaution Comments: spinal Restrictions Weight Bearing Restrictions: No General:   Vital Signs: Therapy Vitals Temp: 98.4 F (36.9 C) Pulse Rate: 100 Resp: 17 BP: 93/62 Patient Position (if appropriate): Lying Oxygen Therapy SpO2: 100 % O2 Device: Room Air Pain:   ADL: ADL Eating: (P) Independent Grooming: (P) Setup Upper Body Dressing: (P) Maximal assistance(performed with PT) Lower Body Dressing: (P) Dependent(performed with PT) Toileting: (P) Dependent Where Assessed-Toileting: (P) Bed level Toilet Transfer: (P) Not assessed(simulated mod A - now has the right comode in his room)) Vision   Perception    Praxis   Exercises:   Other Treatments:     Therapy/Group: Individual  Therapy  Tonny Branch 07/18/2019, 2:44 PM

## 2019-07-19 ENCOUNTER — Inpatient Hospital Stay (HOSPITAL_COMMUNITY): Payer: No Typology Code available for payment source

## 2019-07-19 LAB — GLUCOSE, CAPILLARY
Glucose-Capillary: 153 mg/dL — ABNORMAL HIGH (ref 70–99)
Glucose-Capillary: 155 mg/dL — ABNORMAL HIGH (ref 70–99)
Glucose-Capillary: 216 mg/dL — ABNORMAL HIGH (ref 70–99)
Glucose-Capillary: 212 mg/dL — ABNORMAL HIGH (ref 70–99)

## 2019-07-19 NOTE — Progress Notes (Signed)
Physical Therapy Discharge Summary   Patient Details  Name: Timothy Davenport MRN: [id number] Date of Birth: Jul 04, 1950   Today's Date: 07/19/2019 PT Individual Time: 1050-1200 PT Individual Time Calculation (min): 70 min      Patient has met 8 of 10 long term goals due to improved activity tolerance, improved balance, improved postural control and ability to compensate for deficits.  Patient to discharge at a wheelchair level [place].   Patient's care partner is independent to provide the necessary physical assistance at discharge. Pt wife and two sons have completed hands-on family education and are safe to assist pt upon d/c home. Pt and his family understanding that pt is requesting to d/c home sooner than therapy team initially anticipated and therefore goals have been downgraded from mod I level to min A level. Pt's family understanding and are able to provide min A to pt upon d/c home.   Reasons goals not met: did not meet floor transfer goal as this is not applicable for this patient, did not meet furniture transfer goal as pt will primarily be doing bed<>wheelchair transfers only.    Recommendation:  Patient will benefit from ongoing skilled PT services in home health setting to continue to advance safe functional mobility, address ongoing impairments in endurance, strength, balance, safety, independence with functional mobility, and minimize fall risk.   Equipment: custom ulralight manual w/c (loaner TBD), 30" slide board, hospital bed   Reasons for discharge: treatment goals met and discharge from hospital   Patient/family agrees with progress made and goals achieved: Yes  PT treatment Interventions: Pt seated in w/c upon PT arrival, agreeable to therapy tx and denies pain. Pt propelled w/c throughout the unit this session using B UE 's Mod I. Pt propelled to ortho gym and performed car transfer this session with min assist using slideboard, cues for techniques. Pt use UE  ergometer this session for UE strength and endurance on resistance level 5, x 6 minutes. Pt propelled to the gym and transferred to the mat with CGA using slideboard. Pt performed bed mobility on the mat this session with min assist. In supine pt worked on core strengthening to perform the following exercises - x 10 oblique mini crunches per side, x 10 mini crunches working to clear scapulas with assist, and x 10 lateral trunk flexion reaches while in supine. Pt transferred from supine>long sitting with min assist and cues for techniques to push up onto elbows and then hands. In longsitting position for hamstring stretching this session x 3 min. In long sitting pt performed tricep push ups x 10 for strenghthening, cues for techniques. Pt transferred from longsitting>sitting edge of mat with min assist and cues for techniques. Pt worked on dynamic sitting balance this session to perform alternating UE raises x 10 per side, bilateral UE raises x 10, ball toss against rebound trampoline 2 x 15 and then sitting<>sidesit on elbow x 10 per side. CGA transfer back to w/c with min assist, propelled back to room and left in w/c with needs in reach.     PT Discharge Precautions/Restrictions Vital Signs Therapy Vitals Temp: 97.8 F (36.6 C) Pulse Rate: 92 Resp: 16 BP: 121/85 Patient Position (if appropriate): Sitting Oxygen Therapy SpO2: 100 % O2 Device: Room Air Pain Denies pain Vision/Perception  Perception Perception: Within Functional Limits Praxis Praxis: Intact  Cognition Overall Cognitive Status: Within Functional Limits for tasks assessed Arousal/Alertness: Awake/alert Orientation Level: Oriented X4 Attention: Focused;Sustained Focused Attention: Appears intact Sustained Attention: Appears intact  Memory: Appears intact Immediate Memory Recall: Blue;Bed;Sock Memory Recall Sock: Without Cue Memory Recall Blue: Without Cue Memory Recall Bed: Without Cue Awareness: Appears intact Problem  Solving: Appears intact Safety/Judgment: Appears intact Sensation Sensation Light Touch: Appears Intact(BUE) Hot/Cold: Appears Intact(BUE) Proprioception: Appears Intact(BUE) Coordination Gross Motor Movements are Fluid and Coordinated: Yes Fine Motor Movements are Fluid and Coordinated: Yes Finger Nose Finger Test: intact Motor  Motor Motor: Paraplegia;Abnormal postural alignment and control  Mobility  Min assist for transfers and bed mobility with use of leg loops Trunk/Postural Assessment  Cervical Assessment Cervical Assessment: Within Functional Limits Thoracic Assessment Thoracic Assessment: (forward posture-post sx) Lumbar Assessment Lumbar Assessment: (posterior pelvic tilt) Postural Control Trunk Control: impaired due to weakness, unable to correct post pelvic tilt, able to partially extend thoracic spine and retract shoulder blades w/cues, difficulty maintainning  Balance Static Sitting Balance Static Sitting - Balance Support: Bilateral upper extremity supported Static Sitting - Level of Assistance: 5: Stand by assistance Dynamic Sitting Balance Dynamic Sitting - Balance Support: During functional activity Dynamic Sitting - Level of Assistance: 4: Min assist Extremity Assessment  RUE Assessment RUE Assessment: Within Functional Limits LUE Assessment LUE Assessment: Within Functional Limits    Netta Corrigan, PT, DPT, CSRS 07/19/2019, 7:49 AM

## 2019-07-19 NOTE — Progress Notes (Signed)
Occupational Therapy Session Note  Patient Details  Name: Timothy Davenport MRN: [id number] Date of Birth: 1950-04-25  Today's Date: 07/19/2019 OT Individual Time: 0930-1055 OT Individual Time Calculation (min): 85 min    Short Term Goals: Week 2:  OT Short Term Goal 1 (Week 2): STG=LTG secondary to ELOS  Skilled Therapeutic Interventions/Progress Updates:    Pt received supine with no c/o pain. BSC and slideboard delivered to room, reviewed with pt. Pt completed bed mobility to EOB using bed features with CGA. He transferred to w/c with placement of slideboard with CGA. Pt propelled w/c into bathroom and transferred to TTB with min guard. Min A to doff brief seated on TTB. Pt completed all bathing with close (S), use of grab bars throughout for dynamic sitting balance. Pt required mod A to transfer out of shower 2/2 poor positioning and friction of legs on slideboard. Upon repositioning and use of towel to reduce friction pt transferred with no more than min A. Pt transferred back to bed and donned pants with min cueing for technique. Pt son present for education and very involved/engaged and asking questions. Pt required min A to don ted hose with bag method. Pt was able to don shorts by rolling R and L with cueing provided for adjustment to [place] height. Pt transitioned to EOB and his son was able to return demo and assist with transfer back to w/c. Extensive edu/discussion re use of BSC and slideboard for home, including assisting with problem solving through home barriers. Pt was left sitting up with all needs met, son present.   Therapy Documentation Precautions:  Precautions Precautions: Fall, Back Precaution Booklet Issued: No Precaution Comments: spinal Restrictions Weight Bearing Restrictions: No Therapy/Group: Individual Therapy  Curtis Sites 07/19/2019, 6:38 AM

## 2019-07-19 NOTE — Progress Notes (Signed)
[facility] PHYSICAL MEDICINE & REHABILITATION PROGRESS NOTE   Subjective/Complaints:  No issues   ROS:  Pt denies SOB, abd pain, CP, N/V/C/D, and vision changes   Objective:   No results found. Recent Labs    07/17/19 0628  WBC 10.8*  HGB 10.7*  HCT 33.1*  PLT 235   Recent Labs    07/17/19 0628  NA 136  K 4.5  CL 103  CO2 27  GLUCOSE 157*  BUN 20  CREATININE 1.07  CALCIUM 9.6    Intake/Output Summary (Last 24 hours) at 07/19/2019 [id number] Last data filed at 07/19/2019 0435 Gross per 24 hour  Intake 850 ml  Output 1731 ml  Net -881 ml     Physical Exam: Vital Signs Blood pressure 119/79, pulse 62, temperature 98.8 F (37.1 C), temperature source Oral, resp. rate 16, height 5\' 10"  (1.778 m), weight 79.8 kg, SpO2 100 %.  Physical Exam    General: No acute distress Mood and affect are appropriate Heart: Regular rate and rhythm no rubs murmurs or extra sounds Lungs: Clear to auscultation, breathing unlabored, no rales or wheezes Abdomen: Positive bowel sounds, soft nontender to palpation, nondistended Extremities: No clubbing, cyanosis, or edema Skin: No evidence of breakdown, no evidence of rash      Comments: UEs deltoids, biceps, triceps, WE, grip and finger abd 5/5 B/L 0/5 in HF, KE, KF, DF and PF B/L - no change Neurological: Ox3  No hoffman's B/L Multiple spasms seen in LEs- again today- seen Skin: intact; clean, dry Psychiatric:appropriate   Assessment/Plan: 1. Functional deficits secondary to T8 paraplegia ASIA B which require 3+ hours per day of interdisciplinary therapy in a comprehensive inpatient rehab setting.  Physiatrist is providing close team supervision and 24 hour management of active medical problems listed below.  Physiatrist and rehab team continue to assess barriers to discharge/monitor patient progress toward functional and medical goals  Care Tool:  Bathing    Body parts bathed by patient: Right arm, Left arm, Chest,  Abdomen, Front perineal area, Right upper leg, Left upper leg, Right lower leg, Left lower leg, Face(bed level)   Body parts bathed by helper: Buttocks     Bathing assist Assist Level: Minimal Assistance - Patient > 75%     Upper Body Dressing/Undressing Upper body dressing Upper body dressing/undressing activity did not occur (including orthotics): N/A What is the patient wearing?: Pull over shirt    Upper body assist Assist Level: Contact Guard/Touching assist    Lower Body Dressing/Undressing Lower body dressing      What is the patient wearing?: Pants, Incontinence brief     Lower body assist Assist for lower body dressing: Minimal Assistance - Patient > 75%     Toileting Toileting    Toileting assist Assist for toileting: Minimal Assistance - Patient > 75%     Transfers Chair/bed transfer  Transfers assist  Chair/bed transfer activity did not occur: N/A  Chair/bed transfer assist level: Minimal Assistance - Patient > 75%     Locomotion Ambulation   Ambulation assist   Ambulation activity did not occur: Safety/medical concerns          Walk 10 feet activity   Assist  Walk 10 feet activity did not occur: Safety/medical concerns        Walk 50 feet activity   Assist Walk 50 feet with 2 turns activity did not occur: Safety/medical concerns         Walk 150 feet activity   Assist Walk  150 feet activity did not occur: Safety/medical concerns         Walk 10 feet on uneven surface  activity   Assist Walk 10 feet on uneven surfaces activity did not occur: Safety/medical concerns         Wheelchair     Assist Will patient use wheelchair at discharge?: Yes Type of Wheelchair: Manual    Wheelchair assist level: Independent Max wheelchair distance: 150'    Wheelchair 50 feet with 2 turns activity    Assist        Assist Level: Independent   Wheelchair 150 feet activity     Assist      Assist Level:  Independent   Blood pressure 119/79, pulse 62, temperature 98.8 F (37.1 C), temperature source Oral, resp. rate 16, height 5\' 10"  (1.778 m), weight 79.8 kg, SpO2 100 %.   Medical Problem List and Plan: 1.  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              -patient may  shower             -ELOS/Goals: 3-3.5 weeks- goals Mod I to min assist  5/23- pt wants to leave, if possible, by Friday- explained likely 3 weeks from rehab admission  5/25- pt really wants to leave memorial day- d/c sta[place]  5/26- trying to send home 5/31 if possible- wil make final call Friday this week if possible  5/27- discussed sexuality/intimacy in depth this AM- 20 minutes on topic- suggest getting [facility] to increase Cialis to max dose 5/28- PT also wants me to go over sexuality with wife.   -Continue CIR- plan D/C in am  2.  Antithrombotics: -DVT/anticoagulation:  Mechanical: Sequential compression devices, below knee Bilateral lower extremities Dopplers today were negative.   -need to determine when OK to start Lovenox- due to high risk of DVT. 5/20- have called [facility] to see when can start- since had hematoma 5/22- hadn't heard back- will call Monday 5/24: reconsulted NSGY on this issue today 5/25- on Lovenox now- will need x 3 months total from surgery             -antiplatelet therapy: N/A--has been off Plavix.  3. Pain Management: Continue gabapentin tid with oxycodone prn. Well controlled 4. Mood: LCSW to follow for evaluation and support.              -antipsychotic agents: N/a 5. Neuropsych: This patient is capable of making decisions on his own behalf. 6. Skin/Wound Care: Routine pressure relief measures.  Discussed with pt in depth how necessary pressure relief every 15-20 minutes is essential to prevent pressure ulcers and morbidity/complications. 7. Fluids/Electrolytes/Nutrition:  Monitor I/O. Check lytes in am.  8. Cord compression:On decadron 4 mg IV every 6 hours-->will start taper  5/23- on PO taper- daily as of tomorrow x 3 days  5/27- off Decadrone 9. HTN: Monitor BP tid--continue Norvasc, Cozaar and hydralazine.   Vitals:   07/18/19 1959 07/19/19 0438  BP: 108/60 119/79  Pulse:  62  Resp:  16  Temp:  98.8 F (37.1 C)  SpO2:  100%  BPs running on low side will write parameters on hydralazine - improved this am  10. T2DM: Monitor BS ac/hs. On Glucotrol daily with SSI for elevated BS- likely made worse by Decadron.    CBG (last 3)  Recent Labs    07/18/19 1635  07/18/19 2104 07/19/19 0615  GLUCAP 153* 191* 153*    fair control no med changes 5/30    5/27- last dose of Decadron- hopefully will improve some on its own  5/28- BG's better this AM since last dose of decadron yesterday 11. Neurogenic bladder: Continue Flomax. Discussed at length Foley vs in/out caths- has foley- asked pt and wife to discuss long term what would work best for them.  5/19- thinking about foley vs in/out caths  5/20- will remove foley and teach pt in/out caths asap per his request    5/22- learning today how to do in/out caths- appropriate and doing well per nursing  5/24- doing own caths x 48 hours  5/25- doing own caths  5/28- no issues- doing well per nursing 12. Hematochezia with Neurogenic bowel: Last BM 5/15 with bloody streaks. Will order KUB to evaluate stool burden. Continue Senna S. Will administer Miralax and enema today. Then start on bowel program nightly after dinner..   5/27- results with bowel program  5/28- doing own bowel program- getting results 13. CKD Stage IIIa: Will order post op labs in am to monitor lytes/renal status.  5/19- Cr 1.10 this AM 14. Leucocytosis / UTI- pseduomonasI: Question/likely due to steroids. Monitor for signs of infection. Will check labs in am.  5/19- WBC 13.2- but still on decadron- no signs of infection-will monitor   5/21- WBC  30k!- low grade temp- Tm100.3- started on Keflex for likely UTI based on U/A results.   -will give 1 dose Rocephin considering WBC is 30k- just to make sure he's well covered.  5/22- Will change to Cetazidim 1G q8 hourse since WBC only slightly improved to 28k- stop Keflex- is pseudomonas- will double check with pharmacy on correct ABX  5/23- On Ceftazidime- 1G q8 hours- WBC down to 22k- doing better- con't IV ABX x 7 days total  5/25- end date 5/28-  5/27- labs in AM  5/28- finishing IV ABX today- WBC down to 10.8 15. ABLA: Last H/H checked prior to hematoma evacuation on 05/13-->recheck post op CBC in am.  5/19- Hb stable, however platelets down to 141 - will monitor closely.    5/21- Plts 129- down from 141k - will monitor closely  5/23- recheck labs Monday- monitor platelets especially, considering had hematoma/at risk for DVT.  5/25- Plts 230- doing much better 16. Hypokalemia: Last K+-3.1 --will recheck in am.  5/19- K+ 4.2- con't to monitor  17. GERD-   5/21- pt c/o Acid reflux Sx's- will order protonix in evening.   5/22- Sx's better- con't Protonix 18. Dry eyes  5/25- start tears drop QID and prn 19. Spasticity  5/28- will start Baclofen 5 mg TID per pt request- for annoying spasms.  occ RLE spasms but not causing problems with sleep or movement   20. Patient is a T8 paraplegia ASIA B- will absolutely NEED ULTRA light weight rigid manual w/c due to his paraplegia, impaired sensation, and need at 69 yrs old to be able to propel w/c at community distances- if had a heavier w/c, would be dependent on others to push w/c, Also needs elevating leg rests to reduce risk of pressure ulcers and LE edema and a pressure relieving cushion- preferably an air cushion/ROHO long term to help reduce risk of pressure ulcers along with doing weight shifts every 15-20 minutes.   If any additional questions, call Dr Dagoberto Ligas  LOS: 12 days A FACE TO [place] E  Scarlette Hogston  07/19/2019, 6:52 AM

## 2019-07-19 NOTE — Progress Notes (Signed)
Bowel program was started at 1830 by day shift RN [facility]), pt performed second dig stim around 1930 and was successful with a medium sized (Mushy type stool) around 1950-2000. Pt has no complaints and says he feels comfortable.

## 2019-07-19 NOTE — Plan of Care (Signed)
  Problem: RH BLADDER ELIMINATION Goal: RH STG MANAGE BLADDER WITH ASSISTANCE Description: STG Manage Bladder independently  Outcome: Completed/Met   Problem: RH SAFETY Goal: RH STG ADHERE TO SAFETY PRECAUTIONS W/ASSISTANCE/DEVICE Description: STG Adhere to Safety Precautions With min assist Outcome: Completed/Met Goal: RH STG DECREASED RISK OF FALL WITH ASSISTANCE Description: STG Decreased Risk of Fall With mod I Assistance. Outcome: Completed/Met   Problem: RH PAIN MANAGEMENT Goal: RH STG PAIN MANAGED AT OR BELOW PT'S PAIN GOAL Description: Pain score of 4 or less Outcome: Completed/Met   Problem: RH KNOWLEDGE DEFICIT GENERAL Goal: RH STG INCREASE KNOWLEDGE OF SELF CARE AFTER HOSPITALIZATION Outcome: Completed/Met   Problem: SCI BOWEL ELIMINATION Goal: RH STG MANAGE BOWEL WITH ASSISTANCE Description: STG Manage Bowel with mod Assistance. Outcome: Completed/Met Goal: RH STG SCI MANAGE BOWEL WITH MEDICATION WITH ASSISTANCE Description: STG SCI Manage bowel with medication with min assistance. Outcome: Completed/Met Goal: RH STG SCI MANAGE BOWEL PROGRAM W/ASSIST OR AS APPROPRIATE Description: STG SCI Manage bowel program with mod assist or as appropriate. Outcome: Completed/Met   Problem: SCI BLADDER ELIMINATION Goal: RH STG MANAGE BLADDER WITH ASSISTANCE Description: STG Manage Bladder independently. Outcome: Completed/Met Goal: RH STG MANAGE BLADDER WITH MEDICATION WITH ASSISTANCE Description: STG Manage Bladder With Medication With Supervision. Outcome: Completed/Met   Problem: RH SKIN INTEGRITY Goal: RH STG SKIN FREE OF INFECTION/BREAKDOWN Description: Skin to remain free from infection and breakdown while on rehab with min assist from staff. Outcome: Completed/Met Goal: RH STG MAINTAIN SKIN INTEGRITY WITH ASSISTANCE Description: STG Maintain Skin Integrity Mod I. Outcome: Completed/Met Goal: RH STG ABLE TO PERFORM INCISION/WOUND CARE W/ASSISTANCE Description: STG  Able To Perform Incision/Wound Care Mod I. Outcome: Completed/Met   Problem: RH SAFETY Goal: RH STG ADHERE TO SAFETY PRECAUTIONS W/ASSISTANCE/DEVICE Description: STG Adhere to Safety Precautions With min Assistance and appropriate assistive equipment. Outcome: Completed/Met Goal: RH STG DECREASED RISK OF FALL WITH ASSISTANCE Description: STG Decreased Risk of Fall With min Assistance. Outcome: Completed/Met   Problem: RH PAIN MANAGEMENT Goal: RH STG PAIN MANAGED AT OR BELOW PT'S PAIN GOAL Description: <4 on a 0-10 pain scale. Outcome: Completed/Met   Problem: RH KNOWLEDGE DEFICIT SCI Goal: RH STG INCREASE KNOWLEDGE OF SELF CARE AFTER SCI Description: Patient and caregiver will demonstrate knowledge of medication management, bowel/bladder program, safety precautions, and follow up care with the MD post discharge with min assist from [facility] staff. Outcome: Completed/Met

## 2019-07-19 NOTE — Progress Notes (Signed)
Bowel program started about 1830. Patient able to instruct staff how to position him on his side. Once in position patient able to perform dig stim and insert suppository. Patient completed one round of dig stim about 1850 with small results. Patient left in side lying with call bell in reach. Reported off to night shift

## 2019-07-19 NOTE — Progress Notes (Signed)
Patient able to direct care regarding catheterization independently without prompting. Patient prefers 16F speedicath brand. Patient also able to self administer Lovenox injection with just supervision.

## 2019-07-20 LAB — CBC
HCT: 33.4 % — ABNORMAL LOW (ref 39.0–52.0)
Hemoglobin: 10.7 g/dL — ABNORMAL LOW (ref 13.0–17.0)
MCH: 27.4 pg (ref 26.0–34.0)
MCHC: 32 g/dL (ref 30.0–36.0)
MCV: 85.6 fL (ref 80.0–100.0)
Platelets: 217 10*3/uL (ref 150–400)
RBC: 3.9 MIL/uL — ABNORMAL LOW (ref 4.22–5.81)
RDW: 15.5 % (ref 11.5–15.5)
WBC: 9.4 10*3/uL (ref 4.0–10.5)
nRBC: 0 % (ref 0.0–0.2)

## 2019-07-20 LAB — BASIC METABOLIC PANEL
Anion gap: 6 (ref 5–15)
BUN: 16 mg/dL (ref 8–23)
CO2: 28 mmol/L (ref 22–32)
Calcium: 9.9 mg/dL (ref 8.9–10.3)
Chloride: 106 mmol/L (ref 98–111)
Creatinine, Ser: 1.09 mg/dL (ref 0.61–1.24)
GFR calc Af Amer: >60 mL/min (ref >60)
GFR calc non Af Amer: >60 mL/min (ref >60)
Glucose, Bld: 145 mg/dL — ABNORMAL HIGH (ref 70–99)
Potassium: 4 mmol/L (ref 3.5–5.1)
Sodium: 140 mmol/L (ref 135–145)

## 2019-07-20 LAB — GLUCOSE, CAPILLARY
Glucose-Capillary: 150 mg/dL — ABNORMAL HIGH (ref 70–99)
Glucose-Capillary: 179 mg/dL — ABNORMAL HIGH (ref 70–99)

## 2019-07-20 MED ORDER — GLIPIZIDE 10 MG PO TABS: 10.0000 mg | ORAL_TABLET | Freq: Every day | ORAL | 0 refills | Status: DC

## 2019-07-20 MED ORDER — ACETAMINOPHEN 325 MG PO TABS: 325.0000 mg | ORAL_TABLET | ORAL | Status: AC | PRN

## 2019-07-20 MED ORDER — HYDRALAZINE HCL 10 MG PO TABS: 10.0000 mg | ORAL_TABLET | Freq: Two times a day (BID) | ORAL | 0 refills | Status: DC

## 2019-07-20 MED ORDER — ENOXAPARIN SODIUM 40 MG/0.4ML ~~LOC~~ SOLN: 40.0000 mg | Freq: Every day | SUBCUTANEOUS | 1 refills | Status: DC

## 2019-07-20 MED ORDER — TAMSULOSIN HCL 0.4 MG PO CAPS: 0.4000 mg | ORAL_CAPSULE | Freq: Every day | ORAL | 1 refills | Status: DC

## 2019-07-20 MED ORDER — POLYVINYL ALCOHOL 1.4 % OP SOLN: 1.0000 [drp] | Freq: Four times a day (QID) | OPHTHALMIC | 0 refills | Status: AC

## 2019-07-20 MED ORDER — SENNOSIDES-DOCUSATE SODIUM 8.6-50 MG PO TABS: 2.0000 | ORAL_TABLET | Freq: Every day | ORAL | 1 refills | Status: DC

## 2019-07-20 MED ORDER — BACLOFEN 5 MG PO TABS: 5.0000 mg | ORAL_TABLET | Freq: Three times a day (TID) | ORAL | 0 refills | Status: DC

## 2019-07-20 MED ORDER — TRAMADOL HCL 50 MG PO TABS: 50.0000 mg | ORAL_TABLET | Freq: Four times a day (QID) | ORAL | 0 refills | Status: DC | PRN

## 2019-07-20 MED ORDER — GABAPENTIN 300 MG PO CAPS: 300.0000 mg | ORAL_CAPSULE | Freq: Three times a day (TID) | ORAL | 0 refills | Status: DC

## 2019-07-20 MED ORDER — BISACODYL 10 MG RE SUPP: 10.0000 mg | Freq: Every day | RECTAL | 0 refills | Status: DC

## 2019-07-20 MED ORDER — PRAVASTATIN SODIUM 20 MG PO TABS: 20.0000 mg | ORAL_TABLET | Freq: Every day | ORAL | 0 refills | Status: DC

## 2019-07-20 NOTE — Plan of Care (Signed)
  Problem: RH BOWEL ELIMINATION Goal: RH STG MANAGE BOWEL WITH ASSISTANCE Description: STG Manage Bowel with min assist  07/20/2019 1151 by Rodolph Bong, LPN Outcome: Completed/Met 07/20/2019 1151 by Rodolph Bong, LPN Outcome: Progressing Goal: RH STG MANAGE BOWEL W/MEDICATION W/ASSISTANCE Description: STG Manage Bowel with Medication with min assist 07/20/2019 1151 by Rodolph Bong, LPN Outcome: Completed/Met 07/20/2019 1151 by Rodolph Bong, LPN Outcome: Progressing   Problem: RH SKIN INTEGRITY Goal: RH STG SKIN FREE OF INFECTION/BREAKDOWN 07/20/2019 1151 by Rodolph Bong, LPN Outcome: Completed/Met 07/20/2019 1151 by Rodolph Bong, LPN Outcome: Progressing Goal: RH STG ABLE TO PERFORM INCISION/WOUND CARE W/ASSISTANCE Description: STG Able To Perform Incision/Wound Care With mod I 07/20/2019 1151 by Rodolph Bong, LPN Outcome: Completed/Met 07/20/2019 1151 by Rodolph Bong, LPN Outcome: Progressing   Problem: Consults Goal: RH SPINAL CORD INJURY PATIENT EDUCATION Description:  See Patient Education module for education specifics.  07/20/2019 1151 by Rodolph Bong, LPN Outcome: Completed/Met 07/20/2019 1151 by Rodolph Bong, LPN Outcome: Progressing   Problem: Consults Goal: Skin Care Protocol Initiated - if Braden Score 18 or less Description: If consults are not indicated, leave blank or document N/A 07/20/2019 1151 by Rodolph Bong, LPN Outcome: Completed/Met 07/20/2019 1151 by Rodolph Bong, LPN Outcome: Progressing Goal: Diabetes Guidelines if Diabetic/Glucose > 140 Description: If diabetic or lab glucose is > 140 mg/dl - Initiate Diabetes/Hyperglycemia Guidelines & Document Interventions  07/20/2019 1151 by Rodolph Bong, LPN Outcome: Completed/Met 07/20/2019 1151 by Rodolph Bong, LPN Outcome: Progressing

## 2019-07-20 NOTE — Plan of Care (Signed)
  Problem: RH BOWEL ELIMINATION Goal: RH STG MANAGE BOWEL WITH ASSISTANCE Description: STG Manage Bowel with min assist  Outcome: Progressing Goal: RH STG MANAGE BOWEL W/MEDICATION W/ASSISTANCE Description: STG Manage Bowel with Medication with min assist Outcome: Progressing   Problem: RH SKIN INTEGRITY Goal: RH STG SKIN FREE OF INFECTION/BREAKDOWN Outcome: Progressing Goal: RH STG ABLE TO PERFORM INCISION/WOUND CARE W/ASSISTANCE Description: STG Able To Perform Incision/Wound Care With mod I Outcome: Progressing   Problem: Consults Goal: RH SPINAL CORD INJURY PATIENT EDUCATION Description:  See Patient Education module for education specifics.  Outcome: Progressing   Problem: Consults Goal: Skin Care Protocol Initiated - if Braden Score 18 or less Description: If consults are not indicated, leave blank or document N/A Outcome: Progressing Goal: Diabetes Guidelines if Diabetic/Glucose > 140 Description: If diabetic or lab glucose is > 140 mg/dl - Initiate Diabetes/Hyperglycemia Guidelines & Document Interventions  Outcome: Progressing

## 2019-07-20 NOTE — Discharge Summary (Signed)
Physician Discharge Summary  Patient ID: Timothy Davenport MRN: [id number] DOB/AGE: 05-16-1950 70 y.o.  Admit date: 07/07/2019 Discharge date: 07/20/2019  Discharge Diagnoses:  Principal Problem:   Paraplegia [facility]) Active Problems:   Thoracic myelopathy   Neurogenic bowel   Neurogenic bladder   Discharged Condition: stable   Significant Diagnostic Studies: DG Chest 2 View  Result Date: 07/09/2019 CLINICAL DATA:  Fever EXAM: CHEST - 2 VIEW COMPARISON:  06/30/2019 FINDINGS: The heart size and mediastinal contours are within normal limits. Both lungs are clear. The visualized skeletal structures are unremarkable. IMPRESSION: Clear lungs. Electronically Signed   By: Ulyses Jarred M.D.   On: 07/09/2019 17:26   DG Abd Portable 1V  Result Date: 07/07/2019 CLINICAL DATA:  Clinical constipation. EXAM: PORTABLE ABDOMEN - 1 VIEW COMPARISON:  None. FINDINGS: Supine abdomen shows no gaseous small bowel or colonic distention. Small to moderate stool volume throughout the colon. Visualized bony anatomy unremarkable. IMPRESSION: Small to moderate stool volume. Electronically Signed   By: Misty Stanley M.D.   On: 07/07/2019 17:52    VAS [place] LOWER EXTREMITY VENOUS (DVT)  Result Date: 07/07/2019  Lower Venous DVTStudy Indications: Edema, and BLE PLEGIA.  Comparison Study: none Performing Technologist: June Leap RDMS, RVT  Examination Guidelines: A complete evaluation includes B-mode imaging, spectral Doppler, color Doppler, and power Doppler as needed of all accessible portions of each vessel. Bilateral testing is considered an integral part of a complete examination. Limited examinations for reoccurring indications may be performed as noted. The reflux portion of the exam is performed with the patient in reverse Trendelenburg.  +---------+---------------+---------+-----------+----------+--------------+ RIGHT    CompressibilityPhasicitySpontaneityPropertiesThrombus Aging  +---------+---------------+---------+-----------+----------+--------------+ CFV      Full           Yes      Yes                                 +---------+---------------+---------+-----------+----------+--------------+ SFJ      Full                                                        +---------+---------------+---------+-----------+----------+--------------+ FV Prox  Full                                                        +---------+---------------+---------+-----------+----------+--------------+ FV Mid   Full                                                        +---------+---------------+---------+-----------+----------+--------------+ FV DistalFull                                                        +---------+---------------+---------+-----------+----------+--------------+ PFV      Full                                                        +---------+---------------+---------+-----------+----------+--------------+  POP      Full           Yes      Yes                                 +---------+---------------+---------+-----------+----------+--------------+ PTV      Full                                                        +---------+---------------+---------+-----------+----------+--------------+ PERO     Full                                                        +---------+---------------+---------+-----------+----------+--------------+   +---------+---------------+---------+-----------+----------+--------------+ LEFT     CompressibilityPhasicitySpontaneityPropertiesThrombus Aging +---------+---------------+---------+-----------+----------+--------------+ CFV      Full           Yes      Yes                                 +---------+---------------+---------+-----------+----------+--------------+ SFJ      Full                                                         +---------+---------------+---------+-----------+----------+--------------+ FV Prox  Full                                                        +---------+---------------+---------+-----------+----------+--------------+ FV Mid   Full                                                        +---------+---------------+---------+-----------+----------+--------------+ FV DistalFull                                                        +---------+---------------+---------+-----------+----------+--------------+ PFV      Full                                                        +---------+---------------+---------+-----------+----------+--------------+ POP      Full           Yes      Yes                                 +---------+---------------+---------+-----------+----------+--------------+  PTV      Full                                                        +---------+---------------+---------+-----------+----------+--------------+ PERO     Full                                                        +---------+---------------+---------+-----------+----------+--------------+     Summary: BILATERAL: - No evidence of deep vein thrombosis seen in the lower extremities, bilaterally. -No evidence of popliteal cyst, bilaterally.   *See table(s) above for measurements and observations. Electronically signed by Deitra Mayo MD on 07/07/2019 at 12:55:29 PM.    Final     Labs:  Basic Metabolic Panel: BMP Latest Ref Rng & Units 07/20/2019 07/17/2019 07/13/2019  Glucose 70 - 99 mg/dL 145(H) 157(H) 81  BUN 8 - 23 mg/dL 16 20 26(H)  Creatinine 0.61 - 1.24 mg/dL 1.09 1.07 1.04  Sodium 135 - 145 mmol/L 140 136 136  Potassium 3.5 - 5.1 mmol/L 4.0 4.5 4.4  Chloride 98 - 111 mmol/L 106 103 100  CO2 22 - 32 mmol/L 28 27 26   Calcium 8.9 - 10.3 mg/dL 9.9 9.6 9.7    CBC: CBC Latest Ref Rng & Units 07/20/2019 07/17/2019 07/13/2019  WBC 4.0 - 10.5 K/uL 9.4 10.8(H) 17.3(H)   Hemoglobin 13.0 - 17.0 g/dL 10.7(L) 10.7(L) 11.9(L)  Hematocrit 39.0 - 52.0 % 33.4(L) 33.1(L) 36.3(L)  Platelets 150 - 400 K/uL 217 235 230    CBG: Recent Labs  Lab 07/19/19 1203 07/19/19 1633 07/19/19 2111 07/20/19 0604 07/20/19 1202  GLUCAP 155* 216* 212* 150* 179*    Brief HPI:   Timothy Davenport is a 69 y.o. male with history of HTN, T2DM with neuropathy, recent TIA, gait disorder in the past few months who started developing RLE weakness and numbness X 2 month and recurrent falls. He was admitted on 06/30/2019 with inability to move and worsening of RLE weakness. 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 He was evaluated by Dr. Kathyrn Sheriff and underwent T6 and T7 laminectomy and right T6 and right T7 pediculectomy for microdiskectomy at T6/T7.   Postop he had issues with hypotension and dizziness with presyncope as well as urinary retention.  He was started on IV Decadron as follow-up MRI thoracic spine showed large hematoma in laminectomy bed and paraspinal soft tissues resulting in severe cord compression and cord edema from T5 to mid T7.  Due to neurological decline he was taken back to the OR on 05/14 for evacuation of hematoma and started on Decadron taper.  He continued to be limited by dense paraplegia but was reporting some increase in sensation.  Therapy was ongoing and CIR was recommended due to functional decline   Hospital Course: OBINNA FRANCKE was admitted to rehab 07/07/2019 for inpatient therapies to consist of PT and OT at least three hours five days a week. Past admission physiatrist, therapy team and rehab RN have worked together to provide customized collaborative inpatient rehab. BLE dopplers were negative for DVT  and he was cleared to start SQ Lovenox on 05/20. His blood pressures were monitored on TID basis and have been controlled. He developed low  grade fever on 05/20 with rise in WBC to 30,000. He was found to have pseudomonas aeruginosa UTI and was treated with 7 day course of IV ceftazidime. ABLA is stable. Follow up CBC shows that WBC has normalized and thrombocytopenia has resolved.  Spasticity BLE has been managed with low dose baclofen. Protonix was added to help manage GERD. Back incision is C/D/I and is healing well. Senna S was added in am with dulcolax suppository post supper to help manage neurogenic bowel. His diabetes has been monitored with ac/hs CBG checks and SSI was use prn for tighter BS control. Glucotrol was increased to 10 mg daily and BS have continued to improve as decadron was weaned off. He has been educated on carb modified diet as well as importance of keeping record of I/O.   Foley was removed on 05/20 and he was started on bladder program. He is able to perform self caths independently and is to cath every 4-6 hours to keep volumes < 300 cc. Follow up check of lytes showed that renal status is stable and transient hypokalemia has resolved. Pain is relatively controlled with prn use of Tramadol. He has made progress during his stay but did not reach goal said due to request to shorten his length of stay.  He currently requires min assist at wheelchair level. He will continue to receive follow-up home health PT, OT, SN, CNA and SW by [facility] after discharge.   Rehab course: During patient's stay in rehab weekly team conferences were held to monitor patient's progress, set goals and discuss barriers to discharge. At admission, patient required mod to max assist with ADLs and mod assist with mobility. He  has had improvement in activity tolerance, balance, postural control as well as ability to compensate for deficits.  He is able to complete ADL task at contact-guard to min assist. He requires min assist for sitting at the edge of bed. He is able to perform sliding board transfers with min assist and cues for  placement.  He is able to propel his wheelchair.  Family education was completed with son and wife regarding all aspects of safety and care.    Discharge disposition: 01-Home or Self Care  Diet: Carb modified.  Special Instructions: 1.  Keep a record of fluid intake and continue to perform self cath 4-5 times a day to keep bladder volumes less than 20 cc. 2.  [facility] visit 06/07 at 3 PM for DME items 3. Will need  Lovenox for 3 months total from surgery for DVT prophylaxis.  Allergies as of 07/20/2019   No Known Allergies     Medication List    STOP taking these medications   atorvastatin 80 MG tablet Commonly known as: LIPITOR   methylPREDNISolone 4 MG Tbpk tablet Commonly known as: Medrol   sildenafil 100 MG tablet Commonly known as: VIAGRA     TAKE these medications   acetaminophen 325 MG tablet Commonly known as: TYLENOL Take 1-2 tablets (325-650 mg total) by mouth every 4 (four) hours as needed for mild pain.   amLODipine 10 MG tablet Commonly known as: NORVASC Take 10 mg by mouth daily.   Baclofen 10  MG Tabs Take half tablet (5 mg) by mouth 3 (three) times daily.   bisacodyl 10 MG suppository Commonly known as: DULCOLAX Place 1 suppository (  10 mg total) rectally daily after supper.   enoxaparin 40 MG/0.4ML injection Commonly known as: LOVENOX Inject 0.4 mLs (40 mg total) into the skin daily.   gabapentin 300 MG capsule Commonly known as: NEURONTIN Take 1 capsule (300 mg total) by mouth 3 (three) times daily.   glipiZIDE 10 MG tablet Commonly known as: GLUCOTROL Take 1 tablet (10 mg total) by mouth daily before breakfast. What changed:   medication strength  how much to take   hydrALAZINE 10 MG tablet Commonly known as: APRESOLINE Take 1 tablet (10 mg total) by mouth 2 (two) times daily. What changed:   medication strength  how much to take   losartan 100 MG tablet Commonly known as: COZAAR Take 100 mg by mouth daily.   omeprazole 20 MG  capsule Commonly known as: PRILOSEC Take 20 mg by mouth daily before breakfast.   pioglitazone 30 MG tablet Commonly known as: ACTOS Take 30 mg by mouth daily.   polyvinyl alcohol 1.4 % ophthalmic solution Commonly known as: LIQUIFILM TEARS Place 1 drop into both eyes 4 (four) times daily.   pravastatin 20 MG tablet Commonly known as: PRAVACHOL Take 1 tablet (20 mg total) by mouth daily.   senna-docusate 8.6-50 MG tablet Commonly known as: Senokot-S Take 2 tablets by mouth daily with breakfast.   tamsulosin 0.4 MG Caps capsule Commonly known as: FLOMAX Take 2 capsule (0.8 mg total) by mouth daily. Notes to patient: Do not take any today--start tomorrow 6/1 take this medications daily before bed   traMADol 50 MG tablet--Rx# 28 pills.  Commonly known as: ULTRAM Take 1 tablet (50 mg total) by mouth every 6 (six) hours as needed for moderate pain.      Follow-up Information    Lovorn, Jinny Blossom, MD Follow up.   Specialty: Physical Medicine and Rehabilitation Why: Office will call you with follow up appointment Contact information: [id number] [place] [address] [phone]        [facility], [facility]. Call on 07/21/2019.   Why: for follow up appointment in 1-2 weeks Contact information: [address] [place] [address] [phone]        Consuella Lose, MD. Call on 07/21/2019.   Specialty: Neurosurgery Why: for post op appointment in 1-2 weeks Contact information: [address] [place] [address] [phone]           Signed: Bary Leriche 07/21/2019, 3:58 PM

## 2019-07-20 NOTE — Discharge Instructions (Signed)
Inpatient Rehab Discharge Instructions  Timothy Davenport Discharge date and time: 07/20/19    Activities/Precautions/ Functional Status: Activity: no lifting, driving, or strenuous exercise till cleared by MD Diet: diabetic diet Wound Care: keep wound clean and dry    Functional status:  ___ No restrictions     ___ Walk up steps independently _X__ 24/7 supervision/assistance   ___ Walk up steps with assistance ___ Intermittent supervision/assistance  ___ Bathe/dress independently ___ Walk with walker     _X__ Bathe/dress with assistance ___ Walk Independently    ___ Shower independently ___ Walk with assistance    ___ Shower with assistance _X__ No alcohol     ___ Return to work/school ________  Special Instructions: 1. Keep record of fluid intake and output--need to cath every 4-6 hours to keep bladder volumes less than 300 cc.    COMMUNITY REFERRALS UPON DISCHARGE:    Home Health:   PT       OT        RN (catheter education)    SNA    SW                 Agency: [facility] [facility]  Phone: [phone]   Medical Equipment/Items Ordered: specialty wheelchair (rigid frame wheelchair-[employer]); hospital bed, transfer board, and drop arm bedside commode- [employer]                                                 Agency/Supplier: [facility] [id number] [place] [phone]  Medical Equipment/Items Ordered: catheter supplies (16in fr/[employer]); incontinence supplies (briefs and bed pads)                                                             Agency/Supplier: [employer] [phone]  GENERAL COMMUNITY RESOURCES FOR PATIENT/FAMILY: Reminder:   1) All orders for Home health services, incontinence supplies, DME: hospital bed, wide drop arm bedside commode, and transfer board faxed to PCP- Dr. Carie Caddy. If you would like to know the status of items faxed, please contact PACU SW Abundio Miu [phone] ext [phone].  2) [facility] on [facility] 7th at  3pm for DME items.      My questions have been answered and I understand these instructions. I will adhere to these goals and the provided educational materials after my discharge from the hospital.  Patient/Caregiver Signature _______________________________ Date __________  Clinician Signature _______________________________________ Date __________  Please bring this form and your medication list with you to all your follow-up doctor's appointments.    Carbohydrate Counting For People With Diabetes  Foods with carbohydrates make your blood glucose level go up. Learning how to count carbohydrates can help you control your blood glucose levels. First, identify the foods you eat that contain carbohydrates. Then, using the Foods with Carbohydrates chart, determine about how much carbohydrates are in your meals and snacks. Make sure you are eating foods with fiber, protein, and healthy fat along with your carbohydrate foods.  Foods with Carbohydrates The following table shows carbohydrate foods that have about 15 grams of carbohydrate each. Using measuring cups, spoons, or a food scale when you first begin learning about carbohydrate  counting can help you learn about the portion sizes you typically eat. The following foods have 15 grams carbohydrate each:  Grains  1 slice bread (1 ounce)   1 small tortilla (6-inch size)    large bagel (1 ounce)   1/3 cup pasta or rice (cooked)    hamburger or hot dog bun ( ounce)    cup cooked cereal    to  cup ready-to-eat cereal   2 taco shells (5-inch size) Fruit  1 small fresh fruit ( to 1 cup)    medium banana   17 small grapes (3 ounces)   1 cup melon or berries    cup canned or frozen fruit   2 tablespoons dried fruit (blueberries, cherries, cranberries, raisins)    cup unsweetened fruit juice  Starchy Vegetables   cup cooked beans, peas, corn, potatoes/sweet potatoes    large baked potato (3 ounces)   1 cup  acorn or butternut squash  Snack Foods  3 to 6 crackers   8 potato chips or 13 tortilla chips ( ounce to 1 ounce)   3 cups popped popcorn  Dairy  3/4 cup (6 ounces) nonfat plain yogurt, or yogurt with sugar-free sweetener   1 cup milk   1 cup plain rice, soy, coconut or flavored almond milk Sweets and Desserts   cup ice cream or frozen yogurt   1 tablespoon jam, jelly, pancake syrup, table sugar, or honey   2 tablespoons light pancake syrup   1 inch square of frosted cake or 2 inch square of unfrosted cake   2 small cookies (2/3 ounce each) or  large cookie  Sometimes youll have to estimate carbohydrate amounts if you dont know the exact recipe. One cup of mixed foods like soups can have 1 to 2 carbohydrate servings, while some casseroles might have 2 or more servings of carbohydrate. Foods that have less than 20 calories in each serving can be counted as free foods. Count 1 cup raw vegetables, or  cup cooked non-starchy vegetables as free foods. If you eat 3 or more servings at one meal, then count them as 1 carbohydrate serving.   Foods without Carbohydrates  Not all foods contain carbohydrates. Meat, some dairy, fats, non-starchy vegetables, and many beverages dont contain carbohydrate. So when you count carbohydrates, you can generally exclude chicken, pork, beef, fish, seafood, eggs, tofu, cheese, butter, sour cream, avocado, nuts, seeds, olives, mayonnaise, water, black coffee, unsweetened tea, and zero-calorie drinks. Vegetables with no or low carbohydrate include green beans, cauliflower, tomatoes, and onions.  How much carbohydrate should I eat at each meal?  Carbohydrate counting can help you plan your meals and manage your weight. Following are some starting points for carbohydrate intake at each meal. Work with your registered dietitian nutritionist to find the best range that works for your blood glucose and weight.   To Lose Weight To Maintain Weight  Women 2  - 3 carb servings 3 - 4 carb servings  Men 3 - 4 carb servings 4 - 5 carb servings  Checking your blood glucose after meals will help you know if you need to adjust the timing, type, or number of carbohydrate servings in your meal plan. Achieve and keep a healthy body weight by balancing your food intake and physical activity.  Tips How should I plan my meals?  Plan for half the food on your plate to include non-starchy vegetables, like salad greens, broccoli, or carrots. Try to eat 3 to 5 servings  of non-starchy vegetables every day. Have a protein food at each meal. Protein foods include chicken, fish, meat, eggs, or beans (note that beans contain carbohydrate). These two food groups (non-starchy vegetables and proteins) are low in carbohydrate. If you fill up your plate with these foods, you will eat less carbohydrate but still fill up your stomach. Try to limit your carbohydrate portion to  of the plate.   What fats are healthiest to eat?  Diabetes increases risk for heart disease. To help protect your heart, eat more healthy fats, such as olive oil, nuts, and avocado. Eat less saturated fats like butter, cream, and high-fat meats, like bacon and sausage. Avoid trans fats, which are in all foods that list partially hydrogenated oil as an ingredient.  What should I drink?  Choose drinks that are not sweetened with sugar. The healthiest choices are water, carbonated or seltzer waters, and tea and coffee without added sugars.  Sweet drinks will make your blood glucose go up very quickly. One serving of soda or energy drink is  cup. It is best to drink these beverages only if your blood glucose is low.  Artificially sweetened, or diet drinks, typically do not increase your blood glucose if they have zero calories in them. Read labels of beverages, as some diet drinks do have carbohydrate and will raise your blood glucose.  Label Reading Tips Read Nutrition Facts labels to find out how many grams  of carbohydrate are in a food you want to eat. Dont forget: sometimes serving sizes on the label arent the same as how much food you are going to eat, so you may need to calculate how much carbohydrate is in the food you are serving yourself.   Carbohydrate Counting for People with Diabetes Sample 1-Day Menu  Breakfast  cup yogurt, low fat, low sugar (1 carbohydrate serving)   cup cereal, ready-to-eat, unsweetened (1 carbohydrate serving)  1 cup strawberries (1 carbohydrate serving)   cup almonds ( carbohydrate serving)  Lunch 1, 5 ounce can chunk light tuna  2 ounces cheese, low fat cheddar  6 whole wheat crackers (1 carbohydrate serving)  1 small apple (1 carbohydrate servings)   cup carrots ( carbohydrate serving)   cup snap peas  1 cup 1% milk (1 carbohydrate serving)   Evening Meal Stir fry made with: 3 ounces chicken  1 cup brown rice (3 carbohydrate servings)   cup broccoli ( carbohydrate serving)   cup green beans   cup onions  1 tablespoon olive oil  2 tablespoons teriyaki sauce ( carbohydrate serving)  Evening Snack 1 extra small banana (1 carbohydrate serving)  1 tablespoon peanut butter   Carbohydrate Counting for People with Diabetes Vegan Sample 1-Day Menu  Breakfast 1 cup cooked oatmeal (2 carbohydrate servings)   cup blueberries (1 carbohydrate serving)  2 tablespoons flaxseeds  1 cup soymilk fortified with calcium and vitamin D  1 cup coffee  Lunch 2 slices whole wheat bread (2 carbohydrate servings)   cup baked tofu   cup lettuce  2 slices tomato  2 slices avocado   cup baby carrots ( carbohydrate serving)  1 orange (1 carbohydrate serving)  1 cup soymilk fortified with calcium and vitamin D   Evening Meal Burrito made with: 1 6-inch corn tortilla (1 carbohydrate serving)  1 cup refried vegetarian beans (2 carbohydrate servings)   cup chopped tomatoes   cup lettuce   cup salsa  1/3 cup brown rice (1 carbohydrate serving)  1  tablespoon  olive oil for rice   cup zucchini   Evening Snack 6 small whole grain crackers (1 carbohydrate serving)  2 apricots ( carbohydrate serving)   cup unsalted peanuts ( carbohydrate serving)    Carbohydrate Counting for People with Diabetes Vegetarian (Lacto-Ovo) Sample 1-Day Menu  Breakfast 1 cup cooked oatmeal (2 carbohydrate servings)   cup blueberries (1 carbohydrate serving)  2 tablespoons flaxseeds  1 egg  1 cup 1% milk (1 carbohydrate serving)  1 cup coffee  Lunch 2 slices whole wheat bread (2 carbohydrate servings)  2 ounces low-fat cheese   cup lettuce  2 slices tomato  2 slices avocado   cup baby carrots ( carbohydrate serving)  1 orange (1 carbohydrate serving)  1 cup unsweetened tea  Evening Meal Burrito made with: 1 6-inch corn tortilla (1 carbohydrate serving)   cup refried vegetarian beans (1 carbohydrate serving)   cup tomatoes   cup lettuce   cup salsa  1/3 cup brown rice (1 carbohydrate serving)  1 tablespoon olive oil for rice   cup zucchini  1 cup 1% milk (1 carbohydrate serving)  Evening Snack 6 small whole grain crackers (1 carbohydrate serving)  2 apricots ( carbohydrate serving)   cup unsalted peanuts ( carbohydrate serving)    Copyright 2020  Academy of Nutrition and Dietetics. All rights reserved.     Using Nutrition Labels: Carbohydrate   Serving Size   Look at the serving size. All the information on the label is based on this portion.  Servings Per Container   The number of servings contained in the package.  Guidelines for Carbohydrate   Look at the total grams of carbohydrate in the serving size.   1 carbohydrate choice = 15 grams of carbohydrate.  Range of Carbohydrate Grams Per Choice  Carbohydrate Grams/Choice Carbohydrate Choices  6-10   11-20 1  21-25 1  26-35 2  36-40 2  41-50 3  51-55 3  56-65 4  66-70 4  71-80 5    Copyright 2020  Academy of Nutrition and Dietetics. All rights  reserved.

## 2019-07-20 NOTE — Progress Notes (Signed)
Patient discharged home via [facility]. All belongings sent home with wife and son.

## 2019-07-20 NOTE — Progress Notes (Signed)
[facility] PHYSICAL MEDICINE & REHABILITATION PROGRESS NOTE   Subjective/Complaints:  Pt ready for d/c today- wants IV out.     ROS:   Pt denies SOB, abd pain, CP, N/V/C/D, and vision changes   Objective:   No results found. Recent Labs    07/20/19 0550  WBC 9.4  HGB 10.7*  HCT 33.4*  PLT 217   Recent Labs    07/20/19 0550  NA 140  K 4.0  CL 106  CO2 28  GLUCOSE 145*  BUN 16  CREATININE 1.09  CALCIUM 9.9    Intake/Output Summary (Last 24 hours) at 07/20/2019 0914 Last data filed at 07/20/2019 0700 Gross per 24 hour  Intake 1060 ml  Output 2314 ml  Net -1254 ml     Physical Exam: Vital Signs Blood pressure 111/78, pulse 77, temperature 99.1 F (37.3 C), temperature source Oral, resp. rate 18, height 5\' 10"  (1.778 m), weight 79.8 kg, SpO2 100 %.  Physical Exam    General: sitting up in bed; appropriate, NAD Mood and affect are appropriate Heart: RRR Lungs: CTA b/l Abdomen: soft, NT, ND, (+)BS Extremities: No clubbing, cyanosis, or edema Skin: No evidence of breakdown, no evidence of rash      Comments: UEs deltoids, biceps, triceps, WE, grip and finger abd 5/5 B/L 0/5 in HF, KE, KF, DF and PF B/L - no change Neurological: Ox3  No hoffman's B/L Multiple spasms seen in LEs- again today- seen Skin: intact; clean, dry Psychiatric:appropriate   Assessment/Plan: 1. Functional deficits secondary to T8 paraplegia ASIA B which require 3+ hours per day of interdisciplinary therapy in a comprehensive inpatient rehab setting.  Physiatrist is providing close team supervision and 24 hour management of active medical problems listed below.  Physiatrist and rehab team continue to assess barriers to discharge/monitor patient progress toward functional and medical goals  Care Tool:  Bathing    Body parts bathed by patient: Right arm, Left arm, Chest, Abdomen, Front perineal area, Right upper leg, Left upper leg, Right lower leg, Left lower leg, Face,  Buttocks   Body parts bathed by helper: Buttocks     Bathing assist Assist Level: Minimal Assistance - Patient > 75%     Upper Body Dressing/Undressing Upper body dressing Upper body dressing/undressing activity did not occur (including orthotics): N/A What is the patient wearing?: Pull over shirt    Upper body assist Assist Level: Contact Guard/Touching assist    Lower Body Dressing/Undressing Lower body dressing      What is the patient wearing?: Pants, Incontinence brief     Lower body assist Assist for lower body dressing: Minimal Assistance - Patient > 75%     Toileting Toileting    Toileting assist Assist for toileting: Supervision/Verbal cueing     Transfers Chair/bed transfer  Transfers assist  Chair/bed transfer activity did not occur: N/A  Chair/bed transfer assist level: Contact Guard/Touching assist     Locomotion Ambulation   Ambulation assist   Ambulation activity did not occur: Safety/medical concerns          Walk 10 feet activity   Assist  Walk 10 feet activity did not occur: Safety/medical concerns        Walk 50 feet activity   Assist Walk 50 feet with 2 turns activity did not occur: Safety/medical concerns         Walk 150 feet activity   Assist Walk 150 feet activity did not occur: Safety/medical concerns  Walk 10 feet on uneven surface  activity   Assist Walk 10 feet on uneven surfaces activity did not occur: Safety/medical concerns         Wheelchair     Assist Will patient use wheelchair at discharge?: Yes Type of Wheelchair: Manual    Wheelchair assist level: Independent Max wheelchair distance: 200 ft    Wheelchair 50 feet with 2 turns activity    Assist        Assist Level: Independent   Wheelchair 150 feet activity     Assist      Assist Level: Independent   Blood pressure 111/78, pulse 77, temperature 99.1 F (37.3 C), temperature source Oral, resp. rate 18,  height 5\' 10"  (1.778 m), weight 79.8 kg, SpO2 100 %.   Medical Problem List and Plan: 1.  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              -patient may  shower             -ELOS/Goals: 3-3.5 weeks- goals Mod I to min assist  5/23- pt wants to leave, if possible, by Friday- explained likely 3 weeks from rehab admission  5/25- pt really wants to leave memorial day- d/c staples  5/26- trying to send home 5/31 if possible- wil make final call Friday this week if possible  5/27- discussed sexuality/intimacy in depth this AM- 20 minutes on topic- suggest getting [facility] PCP to increase Cialis to max dose 5/28- PT also wants me to go over sexuality with wife.   -Continue CIR- plan D/C in am  2.  Antithrombotics: -DVT/anticoagulation:  Mechanical: Sequential compression devices, below knee Bilateral lower extremities Dopplers today were negative.   -need to determine when OK to start Lovenox- due to high risk of DVT. 5/20- have called [facility] to see when can start- since had hematoma 5/22- hadn't heard back- will call Monday 5/24: reconsulted NSGY on this issue today 5/25- on Lovenox now- will need x 3 months total from surgery             -antiplatelet therapy: N/A--has been off Plavix.  3. Pain Management: Continue gabapentin tid with oxycodone prn. Well controlled 4. Mood: LCSW to follow for evaluation and support.              -antipsychotic agents: N/a 5. Neuropsych: This patient is capable of making decisions on his own behalf. 6. Skin/Wound Care: Routine pressure relief measures.  Discussed with pt in depth how necessary pressure relief every 15-20 minutes is essential to prevent pressure ulcers and morbidity/complications. 7. Fluids/Electrolytes/Nutrition: Monitor I/O. Check lytes in am.  8. Cord compression:On decadron 4 mg IV every 6 hours-->will start taper  5/23- on PO  taper- daily as of tomorrow x 3 days  5/27- off Decadrone 9. HTN: Monitor BP tid--continue Norvasc, Cozaar and hydralazine.   Vitals:   07/19/19 2009 07/20/19 0453  BP: 119/73 111/78  Pulse: 79 77  Resp:  18  Temp: 98.1 F (36.7 C) 99.1 F (37.3 C)  SpO2: 100% 100%  BPs running on low side will write parameters on hydralazine - improved this am  10. T2DM: Monitor BS ac/hs. On Glucotrol daily with SSI for elevated BS- likely made worse by Decadron.    CBG (last 3)  Recent Labs    07/19/19 1633 07/19/19 2111 07/20/19 0604  GLUCAP 216* 212* 150*  fair control no med changes 5/30    5/27- last dose of Decadron- hopefully will improve some on its own  5/28- BG's better this AM since last dose of decadron yesterday 11. Neurogenic bladder: Continue Flomax. Discussed at length Foley vs in/out caths- has foley- asked pt and wife to discuss long term what would work best for them.  5/19- thinking about foley vs in/out caths  5/20- will remove foley and teach pt in/out caths asap per his request    5/22- learning today how to do in/out caths- appropriate and doing well per nursing  5/24- doing own caths x 48 hours  5/25- doing own caths  5/28- no issues- doing well per nursing 12. Hematochezia with Neurogenic bowel: Last BM 5/15 with bloody streaks. Will order KUB to evaluate stool burden. Continue Senna S. Will administer Miralax and enema today. Then start on bowel program nightly after dinner..   5/27- results with bowel program  5/28- doing own bowel program- getting results 13. CKD Stage IIIa: Will order post op labs in am to monitor lytes/renal status.  5/19- Cr 1.10 this AM 14. Leucocytosis / UTI- pseduomonasI: Question/likely due to steroids. Monitor for signs of infection. Will check labs in am.  5/19- WBC 13.2- but still on decadron- no signs of infection-will monitor   5/21- WBC 30k!- low grade temp- Tm100.3- started on Keflex for likely UTI based on U/A results.   -will  give 1 dose Rocephin considering WBC is 30k- just to make sure he's well covered.  5/22- Will change to Cetazidim 1G q8 hourse since WBC only slightly improved to 28k- stop Keflex- is pseudomonas- will double check with pharmacy on correct ABX  5/23- On Ceftazidime- 1G q8 hours- WBC down to 22k- doing better- con't IV ABX x 7 days total  5/25- end date 5/28-  5/27- labs in AM  5/28- finishing IV ABX today- WBC down to 10.8 15. ABLA: Last H/H checked prior to hematoma evacuation on 05/13-->recheck post op CBC in am.  5/19- Hb stable, however platelets down to 141 - will monitor closely.    5/21- Plts 129- down from 141k - will monitor closely  5/23- recheck labs Monday- monitor platelets especially, considering had hematoma/at risk for DVT.  5/25- Plts 230- doing much better 16. Hypokalemia: Last K+-3.1 --will recheck in am.  5/19- K+ 4.2- con't to monitor  17. GERD-   5/21- pt c/o Acid reflux Sx's- will order protonix in evening.   5/22- Sx's better- con't Protonix 18. Dry eyes  5/25- start tears drop QID and prn 19. Spasticity  5/28- will start Baclofen 5 mg TID per pt request- for annoying spasms.  occ RLE spasms but not causing problems with sleep or movement   20. Patient is a T8 paraplegia ASIA B- will absolutely NEED ULTRA light weight rigid manual w/c due to his paraplegia, impaired sensation, and need at 69 yrs old to be able to propel w/c at community distances- if had a heavier w/c, would be dependent on others to push w/c, Also needs elevating leg rests to reduce risk of pressure ulcers and LE edema and a pressure relieving cushion- preferably an air cushion/ROHO long term to help reduce risk of pressure ulcers along with doing weight shifts every 15-20 minutes.   21. Dispo  5/31- d/c today- will have f/u with Dr Dagoberto Ligas in ~ 2 weeks and PA to go over d/c meds with him- explained Pain meds can only get Rx for 7 days  and if needs after that to call my clinic.   LOS: 13 days A  FACE TO FACE EVALUATION WAS PERFORMED  Timothy Davenport 07/20/2019, 9:14 AM

## 2019-07-20 NOTE — Progress Notes (Signed)
Inpatient Rehabilitation Care Coordinator  Discharge Note  The overall goal for the admission was met for:   Discharge location: Yes. D/c to home.  Length of Stay: Yes. 13 days.   Discharge activity level: Yes. Mod Asst.  Home/community participation: Yes. Limited  Services provided included: MD, RD, PT, OT, RN, CM, TR, Pharmacy, Neuropsych and SW  Financial Services: [employer] and Private Insurance: [place] and [place]  Follow-up services arranged: Home Health: [facility] [place] [facility] for PT/OT/SN/CNA/SW and DME: specialty w/c [employer]; [place] bed, transfer board, and DABSC; [employer]- catheters and incontinence supplies (briefs and bed pads)  *All orders faxed to [facility]- Dr. Carie Caddy [phone]  Comments (or additional information): contact pt [phone] or pt wife [name] [phone]  Patient/Family verbalized understanding of follow-up arrangements: Yes  Individual responsible for coordination of the follow-up plan: Pt to have assistance with coordinating care needs from family.   Confirmed correct DME delivered: Rana Snare 07/20/2019    Rana Snare

## 2019-07-21 NOTE — Progress Notes (Signed)
Patient called to relay that he had traumatic cath--had small clot and blood at the tip of catheter. Urine was clear --no bleeding seen. Reviewed I/O cath procedure and recommended increase use of lubricant to avoid trauma to prostate as he reports difficulty passing catheter at times. To continue to monitor for bleeding. Also, spoke with wife and reviewed home meds again.

## 2019-07-22 ENCOUNTER — Emergency Department (HOSPITAL_COMMUNITY)
Admission: EM | Admit: 2019-07-22 | Discharge: 2019-07-22 | Disposition: A | Discharge status: Home / Self Care | Payer: No Typology Code available for payment source | Attending: Emergency Medicine | Admitting: Emergency Medicine

## 2019-07-22 ENCOUNTER — Other Ambulatory Visit: Payer: Self-pay

## 2019-07-22 ENCOUNTER — Encounter (HOSPITAL_COMMUNITY): Payer: Self-pay | Admitting: *Deleted

## 2019-07-22 ENCOUNTER — Emergency Department (HOSPITAL_COMMUNITY): Payer: No Typology Code available for payment source

## 2019-07-22 DIAGNOSIS — S0990XA Unspecified injury of head, initial encounter: Secondary | ICD-10-CM | POA: Diagnosis not present

## 2019-07-22 DIAGNOSIS — Y929 Unspecified place or not applicable: Secondary | ICD-10-CM | POA: Insufficient documentation

## 2019-07-22 DIAGNOSIS — G8220 Paraplegia, unspecified: Secondary | ICD-10-CM | POA: Diagnosis not present

## 2019-07-22 DIAGNOSIS — E119 Type 2 diabetes mellitus without complications: Secondary | ICD-10-CM | POA: Insufficient documentation

## 2019-07-22 DIAGNOSIS — I10 Essential (primary) hypertension: Secondary | ICD-10-CM | POA: Diagnosis not present

## 2019-07-22 DIAGNOSIS — W050XXA Fall from non-moving wheelchair, initial encounter: Secondary | ICD-10-CM | POA: Insufficient documentation

## 2019-07-22 DIAGNOSIS — Z7984 Long term (current) use of oral hypoglycemic drugs: Secondary | ICD-10-CM | POA: Insufficient documentation

## 2019-07-22 DIAGNOSIS — Z79899 Other long term (current) drug therapy: Secondary | ICD-10-CM | POA: Diagnosis not present

## 2019-07-22 DIAGNOSIS — Y9389 Activity, other specified: Secondary | ICD-10-CM | POA: Insufficient documentation

## 2019-07-22 DIAGNOSIS — Z7901 Long term (current) use of anticoagulants: Secondary | ICD-10-CM | POA: Diagnosis not present

## 2019-07-22 DIAGNOSIS — W19XXXA Unspecified fall, initial encounter: Secondary | ICD-10-CM

## 2019-07-22 DIAGNOSIS — Y999 Unspecified external cause status: Secondary | ICD-10-CM | POA: Diagnosis not present

## 2019-07-22 LAB — CBG MONITORING, ED
Glucose-Capillary: 206 mg/dL — ABNORMAL HIGH (ref 70–99)
Glucose-Capillary: 146 mg/dL — ABNORMAL HIGH (ref 70–99)

## 2019-07-22 IMAGING — CT CT HEAD W/O CM
4 series · 16 of 47 positions shown, 18 images · non-contrast
Comparison: Brain MRI [DATE], head CT [DATE].

CLINICAL DATA: Fall with head trauma on blood thinners; poly
trauma, critical, head/cervical spine injury suspected.

EXAM:
CT HEAD WITHOUT CONTRAST
TECHNIQUE: Contiguous axial images were obtained from the base of the skull
through the vertex without intravenous contrast.

[Series 3: head wo · axial · 0.46mm/px · z∈[-154,-29]mm · 7 of 35 slices shown, 9 images]
[im 5/35  brain]
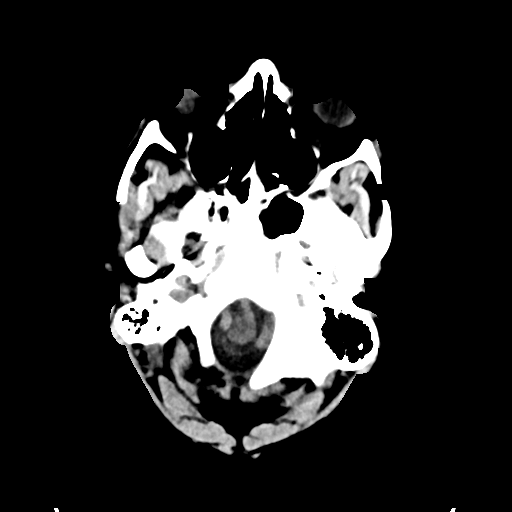
[im 5/35  bone]
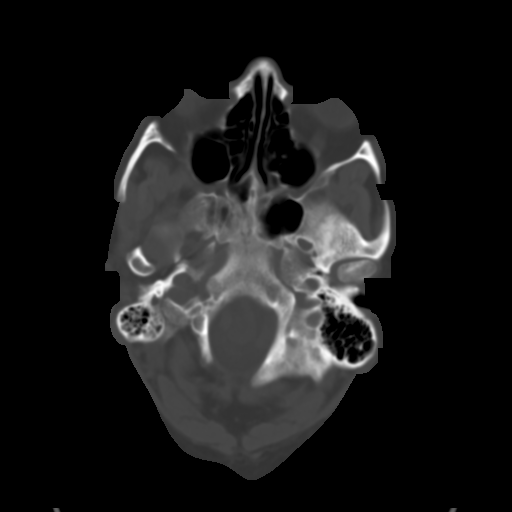
[im 9/35  brain]
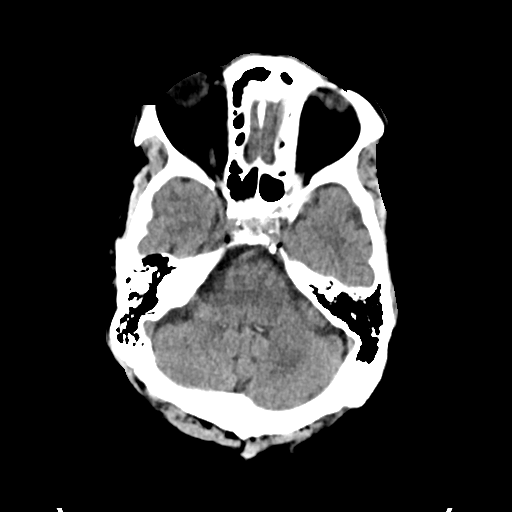
[im 13/35  brain]
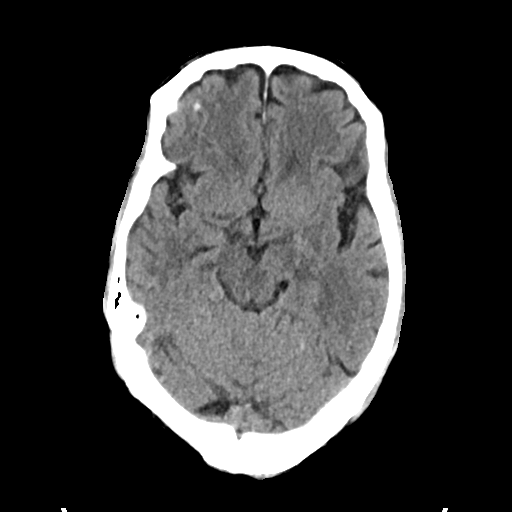
[im 18/35  brain]
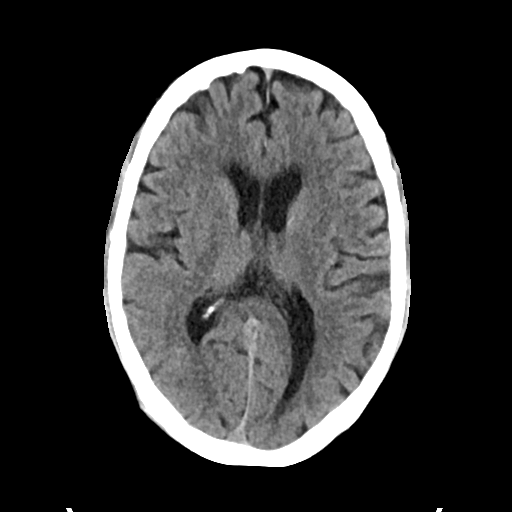
[im 22/35  brain]
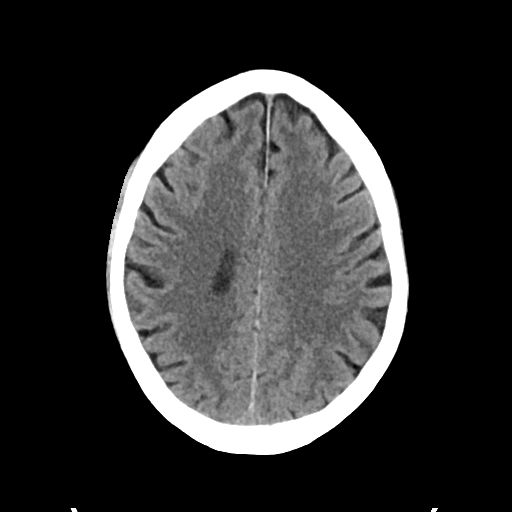
[im 22/35  bone]
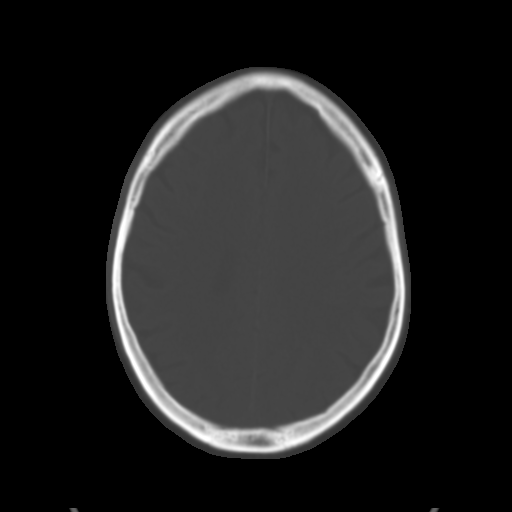
[im 26/35  brain]
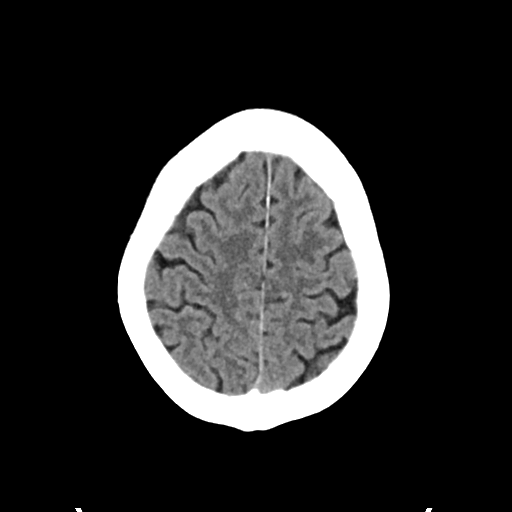
[im 30/35  brain]
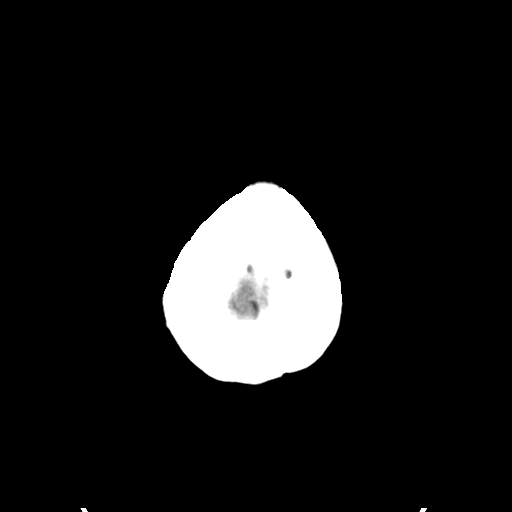

[Series 4: head bone · axial · 0.46mm/px · z∈[-158,-124]mm · 3 of 87 slices shown]
[im 9/87  bone]
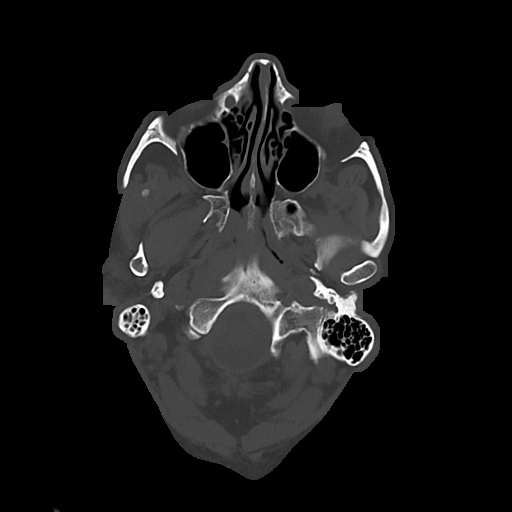
[im 18/87  bone]
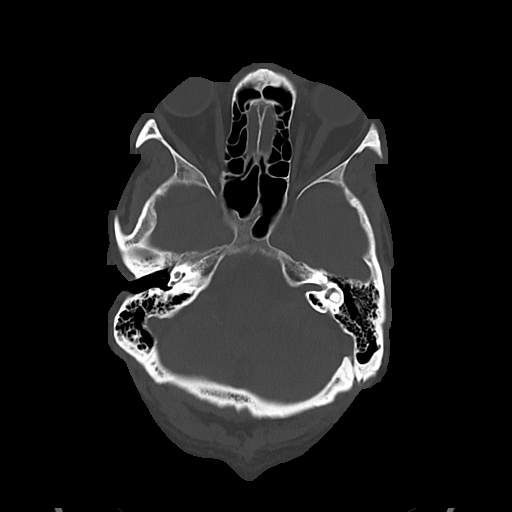
[im 26/87  bone]
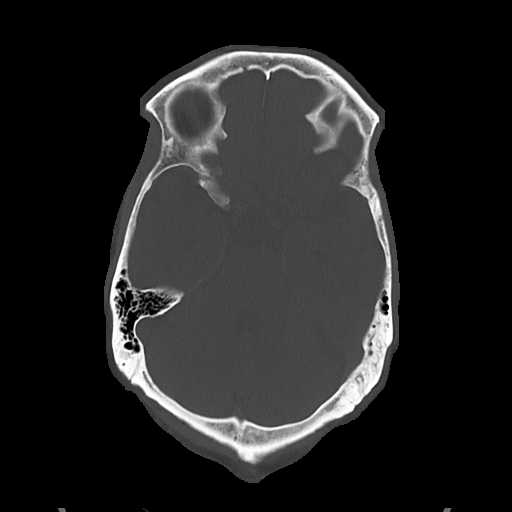

[Series 5: cor soft · coronal · 0.34mm/px · 3 of 73 slices shown]
[im 25/73  brain]
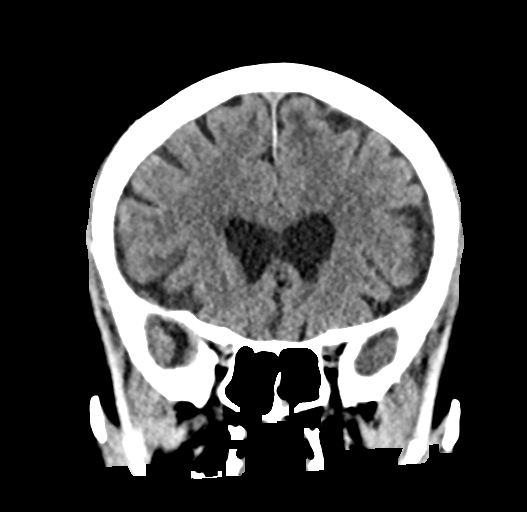
[im 33/73  brain]
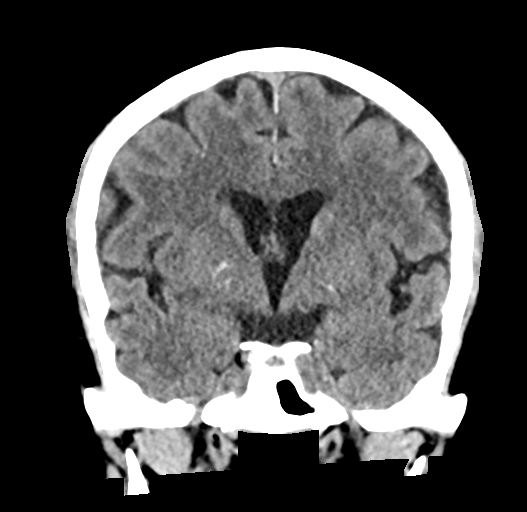
[im 41/73  brain]
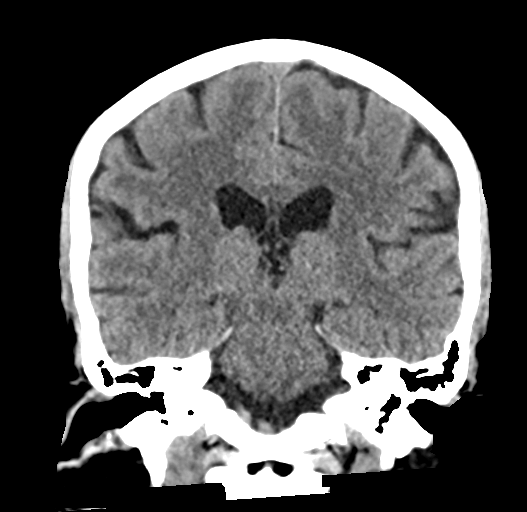

[Series 6: sag soft · sagittal · 0.34mm/px · 3 of 60 slices shown]
[im 20/60  brain]
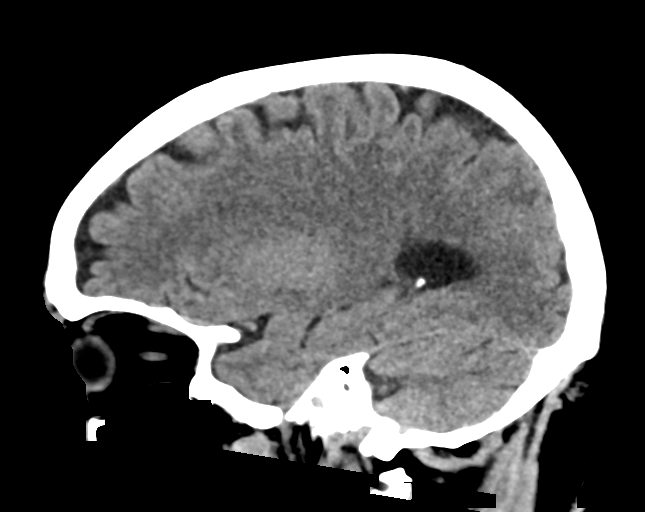
[im 30/60  brain]
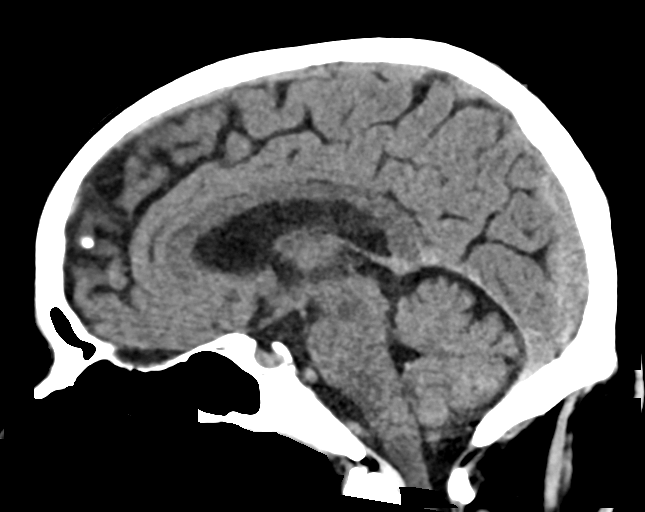
[im 40/60  brain]
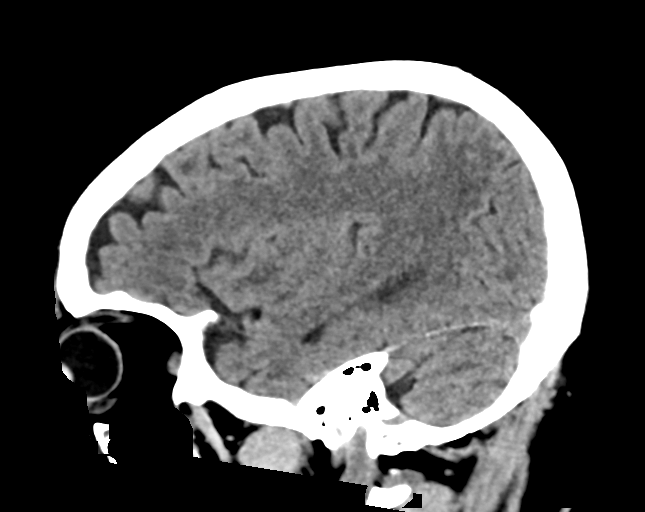

[16 of 47 positions shown; findings below may reference images not displayed]

FINDINGS: Brain:

Mild ill-defined hypoattenuation within the cerebral white matter
and pons is nonspecific, but consistent with chronic small vessel
ischemic disease. Findings are similar as compared to prior head CT
[DATE]. Stable, mild generalized parenchymal atrophy.

There is no acute intracranial hemorrhage.

No demarcated cortical infarct.

No extra-axial fluid collection.

No evidence of intracranial mass.

No midline shift.

Vascular: No hyperdense vessel.  Atherosclerotic calcifications.

Skull: Normal. Negative for fracture or focal lesion.

Sinuses/Orbits: Visualized orbits show no acute finding. There is a
small amount of frothy secretions within posterior left ethmoid air
cells. Small right mastoid effusion.
IMPRESSION: No evidence of acute intracranial abnormality.

Stable mild generalized parenchymal atrophy and chronic small vessel
ischemic disease.

Ethmoid sinusitis.

Small right mastoid effusion.

## 2019-07-22 NOTE — ED Notes (Signed)
Patient verbalizes understanding of discharge instructions. Opportunity for questioning and answers were provided. Armband removed by staff, pt discharged from ED in wheelchair to home with assistance from family.
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
## 2019-07-22 NOTE — ED Notes (Signed)
Pt waiting in hall and started crying because he is waiting for PTAR. CN contacted to to explain Pt concern . Pt offered to be moved to a room where he could watch . Pt agreed to the move.
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
## 2019-07-22 NOTE — ED Notes (Signed)
To ct

## 2019-07-22 NOTE — ED Notes (Signed)
Called ptar for transport  

## 2019-07-22 NOTE — ED Notes (Signed)
Back from CT

## 2019-07-22 NOTE — ED Notes (Signed)
This RN assisted this pt perform an in and out catheterization.

## 2019-07-22 NOTE — Discharge Instructions (Signed)
As discussed, your evaluation today has been largely reassuring.  But, it is important that you monitor your condition carefully, and do not hesitate to return to the ED if you develop new, or concerning changes in your condition. ? ?Otherwise, please follow-up with your physician for appropriate ongoing care. ? ?

## 2019-07-22 NOTE — ED Triage Notes (Signed)
Pt reports he had surgery in may 2021 for a ruptured disk . Pt has not been able to walk since surgery . Pt reports he has sensation to bot legs but was told it will take time for movement to return.

## 2019-07-22 NOTE — ED Notes (Signed)
ptar cancelled

## 2019-07-22 NOTE — ED Notes (Signed)
Wife called to check on husband. Wife wanted Pt's CBG checked.

## 2019-07-22 NOTE — ED Notes (Addendum)
Observed self cath with pt as asked by MD. Pt and family performed the procedure using materials from the quick cath kit. Pt was able to drain about 325 mL urine and reported no pain or difficulty with the procedure.   Some urine was collected separately for analysis if necessary.

## 2019-07-23 ENCOUNTER — Telehealth: Payer: Self-pay

## 2019-07-23 ENCOUNTER — Other Ambulatory Visit: Payer: Self-pay

## 2019-07-23 ENCOUNTER — Emergency Department (HOSPITAL_COMMUNITY)
Admission: EM | Admit: 2019-07-23 | Discharge: 2019-07-23 | Disposition: A | Discharge status: Home / Self Care | Payer: No Typology Code available for payment source | Attending: Emergency Medicine | Admitting: Emergency Medicine

## 2019-07-23 ENCOUNTER — Encounter (HOSPITAL_COMMUNITY): Payer: Self-pay

## 2019-07-23 DIAGNOSIS — T85838A Hemorrhage due to other internal prosthetic devices, implants and grafts, initial encounter: Secondary | ICD-10-CM | POA: Diagnosis not present

## 2019-07-23 DIAGNOSIS — R339 Retention of urine, unspecified: Secondary | ICD-10-CM | POA: Diagnosis not present

## 2019-07-23 DIAGNOSIS — Z96659 Presence of unspecified artificial knee joint: Secondary | ICD-10-CM | POA: Insufficient documentation

## 2019-07-23 DIAGNOSIS — G8220 Paraplegia, unspecified: Secondary | ICD-10-CM | POA: Diagnosis not present

## 2019-07-23 DIAGNOSIS — N4889 Other specified disorders of penis: Secondary | ICD-10-CM | POA: Diagnosis present

## 2019-07-23 DIAGNOSIS — N3001 Acute cystitis with hematuria: Secondary | ICD-10-CM | POA: Diagnosis not present

## 2019-07-23 DIAGNOSIS — E119 Type 2 diabetes mellitus without complications: Secondary | ICD-10-CM | POA: Insufficient documentation

## 2019-07-23 LAB — URINALYSIS, ROUTINE W REFLEX MICROSCOPIC
Bilirubin Urine: NEGATIVE
Glucose, UA: NEGATIVE mg/dL
Ketones, ur: NEGATIVE mg/dL
Nitrite: NEGATIVE
Protein, ur: NEGATIVE mg/dL
RBC / HPF: >50 RBC/hpf — ABNORMAL HIGH (ref 0–5)
Specific Gravity, Urine: 1.019 (ref 1.005–1.030)
pH: 5 (ref 5.0–8.0)

## 2019-07-23 MED ORDER — CIPROFLOXACIN HCL 500 MG PO TABS
500.0000 mg | ORAL_TABLET | Freq: Once | ORAL | Status: AC
  Administered 2019-07-23: 500 mg via ORAL
  Filled 2019-07-23: qty 1

## 2019-07-23 MED ORDER — CIPROFLOXACIN HCL 500 MG PO TABS
500.0000 mg | ORAL_TABLET | Freq: Two times a day (BID) | ORAL | 0 refills | Status: DC
Start: 2019-07-23 — End: 2020-03-02

## 2019-07-23 NOTE — ED Triage Notes (Signed)
Recent paraplegic and recently released on Monday.   Patient places his own catheters for drainage. Today around 4pm patient tried to cath himself and had pain and blood output. Patient reports cathing himself every 4 hours.    A/Ox4

## 2019-07-23 NOTE — ED Provider Notes (Signed)
[facility] DEPT Provider Note   CSN: [id number] Arrival date & time: 07/23/19  1703     History Chief Complaint  Patient presents with  . Bleeding from Foley Catheter    Timothy Davenport is a 69 y.o. male p.  Pt presents to the ED today with bleeding from his penis.  Pt is a recent paraplegic from a fall in May.   He presented to the ED on 5/11 for acute paresis after the fall.  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  He underwent surgical decompression on 5/12 and developed a hematoma.  He was d/c on 5/18 in a wheelchair with BLE plegia. He has been having to do self cathing every 4 hours.  It's been going well until this afternoon.  He said he tried to cath himself twice and just blood came out.  Pt said he was unable to get any urine to come out.  He is on Lovenox.         Past Medical History:  Diagnosis Date  . Colon polyp   . Diabetes mellitus without complication ([place])   . Diabetic neuropathy ([place])   . HTN (hypertension)   . Patella fracture   . Renal calculi     Patient Active Problem List   Diagnosis Date Noted  . Paraplegia ([place]) 07/07/2019  . Neurogenic bowel 07/07/2019  . Neurogenic bladder 07/07/2019  . 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 LUMBAR LAMINECTOMY/DECOMPRESSION MICRODISCECTOMY N/A 07/01/2019   Procedure: LUMBAR LAMINECTOMY/DECOMPRESSION MICRODISCECTOMY 1 LEVEL, THORACIC SIX-SEVEN;  Surgeon: Consuella Lose, MD;  Location: [facility];  Service: Neurosurgery;  Laterality: N/A;  LUMBAR LAMINECTOMY/DECOMPRESSION MICRODISCECTOMY 1 LEVEL, THORACIC SIX-SEVEN   . ORIF PATELLA    . THORACIC DISCECTOMY N/A 07/03/2019   Procedure: Evacuation of Thoracic Hematoma;  Surgeon: Consuella Lose, MD;  Location: [facility];  Service: Neurosurgery;  Laterality: N/A;       Family History  Problem Relation Age of Onset  . Hypertension Mother   . Hypertension Father     Social  History   Tobacco Use  . Smoking status: Never Smoker  . Smokeless tobacco: Never Used  Substance Use Topics  . Alcohol use: Yes    Comment: 2-3 drinks week  . Drug use: Yes    Types: Marijuana    Comment: occasionally    Home Medications Prior to Admission medications   Medication Sig Start Date End Date Taking? Authorizing Provider  acetaminophen (TYLENOL) 325 MG tablet Take 1-2 tablets (325-650 mg total) by mouth every 4 (four) hours as needed for mild pain. 07/20/19  Yes Love, Ivan Anchors, PA-C  amLODipine (NORVASC) 10 MG tablet Take 10 mg by mouth daily.   Yes [provider]  baclofen (LIORESAL) 10 MG tablet Take 5 mg by mouth 3 (three) times daily.  07/20/19  Yes [provider]  enoxaparin (LOVENOX) 40 MG/0.4ML injection Inject 0.4 mLs (40 mg total) into the skin daily. 07/21/19  Yes Love, Ivan Anchors, PA-C  gabapentin (NEURONTIN) 300 MG capsule Take 1 capsule (300 mg total) by mouth 3 (three) times daily. 07/20/19  Yes Love, Ivan Anchors, PA-C  glipiZIDE (GLUCOTROL) 10 MG tablet Take 1 tablet (10 mg total) by mouth daily before breakfast. 07/21/19  Yes Love, Ivan Anchors, PA-C  hydrALAZINE (APRESOLINE) 10 MG tablet Take 1 tablet (10 mg total) by mouth 2 (two) times daily. 07/20/19  Yes Love, Ivan Anchors, PA-C  losartan (  COZAAR) 100 MG tablet Take 100 mg by mouth daily.   Yes [provider]  omeprazole (PRILOSEC) 20 MG capsule Take 20 mg by mouth daily before breakfast.   Yes [provider]  pioglitazone (ACTOS) 30 MG tablet Take 30 mg by mouth daily.   Yes [provider]  polyvinyl alcohol (LIQUIFILM TEARS) 1.4 % ophthalmic solution Place 1 drop into both eyes 4 (four) times daily. 07/20/19  Yes Love, Ivan Anchors, PA-C  pravastatin (PRAVACHOL) 20 MG tablet Take 1 tablet (20 mg total) by mouth daily. 07/20/19  Yes Love, Ivan Anchors, PA-C  senna-docusate (SENOKOT-S) 8.6-50 MG tablet Take 2 tablets by mouth daily with breakfast. 07/21/19  Yes Love, Ivan Anchors, PA-C   tamsulosin (FLOMAX) 0.4 MG CAPS capsule Take 1 capsule (0.4 mg total) by mouth daily. 07/21/19  Yes Love, Ivan Anchors, PA-C  traMADol (ULTRAM) 50 MG tablet Take 1 tablet (50 mg total) by mouth every 6 (six) hours as needed for moderate pain. 07/20/19  Yes Love, Ivan Anchors, PA-C  Baclofen 5 MG TABS Take 5 mg by mouth 3 (three) times daily. Patient not taking: Reported on 07/22/2019 07/20/19   Love, Ivan Anchors, PA-C  bisacodyl (DULCOLAX) 10 MG suppository Place 1 suppository (10 mg total) rectally daily after supper. 07/20/19   Love, Ivan Anchors, PA-C  ciprofloxacin (CIPRO) 500 MG tablet Take 1 tablet (500 mg total) by mouth 2 (two) times daily. 07/23/19   Isla Pence, MD    Allergies    Patient has no known allergies.  Review of Systems   Review of Systems  Genitourinary:       Blood from penis.  Inability to cath bladder.  All other systems reviewed and are negative.   Physical Exam Updated Vital Signs BP 117/88 (BP Location: Right Arm)   Pulse 85   Temp 98.9 F (37.2 C) (Oral)   Resp 12   SpO2 100%   Physical Exam Vitals and nursing note reviewed. Exam conducted with a chaperone present.  Constitutional:      Appearance: Normal appearance.  HENT:     Head: Normocephalic and atraumatic.     Right Ear: External ear normal.     Left Ear: External ear normal.     Nose: Nose normal.     Mouth/Throat:     Mouth: Mucous membranes are moist.     Pharynx: Oropharynx is clear.  Eyes:     Extraocular Movements: Extraocular movements intact.     Conjunctiva/sclera: Conjunctivae normal.     Pupils: Pupils are equal, round, and reactive to light.  Cardiovascular:     Rate and Rhythm: Normal rate and regular rhythm.     Pulses: Normal pulses.     Heart sounds: Normal heart sounds.  Pulmonary:     Effort: Pulmonary effort is normal.     Breath sounds: Normal breath sounds.  Abdominal:     General: Abdomen is flat. Bowel sounds are normal.     Palpations: Abdomen is soft.  Genitourinary:     Penis: Uncircumcised.      Comments: Bleeding from penis has resolved.  Dried blood noted. Musculoskeletal:     Cervical back: Normal range of motion and neck supple.  Skin:    General: Skin is warm.     Capillary Refill: Capillary refill takes less than 2 seconds.  Neurological:     Mental Status: He is alert and oriented to person, place, and time.     Comments: BLE plegia  Psychiatric:  Mood and Affect: Mood normal.        Behavior: Behavior normal.     ED Results / Procedures / Treatments   Labs (all labs ordered are listed, but only abnormal results are displayed) Labs Reviewed  URINALYSIS, ROUTINE W REFLEX MICROSCOPIC - Abnormal; Notable for the following components:      Result Value   Hgb urine dipstick LARGE (*)    Leukocytes,Ua SMALL (*)    RBC / HPF >50 (*)    Bacteria, UA RARE (*)    All other components within normal limits  URINE CULTURE    EKG None  Radiology CT Head Wo Contrast  Result Date: 07/22/2019 CLINICAL DATA:  Fall with head trauma on blood thinners; poly trauma, critical, head/cervical spine injury suspected. EXAM: CT HEAD WITHOUT CONTRAST TECHNIQUE: Contiguous axial images were obtained from the base of the skull through the vertex without intravenous contrast. COMPARISON:  Brain MRI 06/30/2019, head CT 03/03/2018. FINDINGS: Brain: Mild ill-defined hypoattenuation within the cerebral white matter and pons is nonspecific, but consistent with chronic small vessel ischemic disease. Findings are similar as compared to prior head CT 03/03/2018. Stable, mild generalized parenchymal atrophy. There is no acute intracranial hemorrhage. No demarcated cortical infarct. No extra-axial fluid collection. No evidence of intracranial mass. No midline shift. Vascular: No hyperdense vessel.  Atherosclerotic calcifications. Skull: Normal. Negative for fracture or focal lesion. Sinuses/Orbits: Visualized orbits show no acute finding. There is a small amount of frothy  secretions within posterior left ethmoid air cells. Small right mastoid effusion. IMPRESSION: No evidence of acute intracranial abnormality. Stable mild generalized parenchymal atrophy and chronic small vessel ischemic disease. Ethmoid sinusitis. Small right mastoid effusion. Electronically Signed   By: Kellie Simmering DO   On: 07/22/2019 14:33    Procedures Procedures (including critical care time)  Medications Ordered in ED Medications  ciprofloxacin (CIPRO) tablet 500 mg (has no administration in time range)    ED Course  I have reviewed the triage vital signs and the nursing notes.  Pertinent labs & imaging results that were available during my care of the patient were reviewed by me and considered in my medical decision making (see chart for details).    MDM Rules/Calculators/A&P                      Pt's nurse able to pass a foley.  Pt may have created a false passage, so I will keep foley in the patient.  He does not have a urologist.  He is instructed to f/u with urology.  Pt's last UTI grew out pseudomonas sensitive to cipro, so he's put on cipro. Final Clinical Impression(s) / ED Diagnoses Final diagnoses:  Urinary retention  Acute cystitis with hematuria    Rx / DC Orders ED Discharge Orders         Ordered    ciprofloxacin (CIPRO) 500 MG tablet  2 times daily     07/23/19 1827           Isla Pence, MD 07/23/19 1830

## 2019-07-23 NOTE — ED Notes (Signed)
An After Visit Summary was printed and given to the patient. Discharge instructions given and no further questions at this time.  Pt son is driving pt home.

## 2019-07-23 NOTE — Telephone Encounter (Signed)
Left message for patient to call back  

## 2019-07-25 LAB — URINE CULTURE: Culture: NO GROWTH

## 2019-07-28 ENCOUNTER — Telehealth: Payer: Self-pay

## 2019-07-28 NOTE — Telephone Encounter (Signed)
Insurance will not cover unless examined- so I am unable to order without someone examining him- if he needs to go to ER - they can do it, IF they feel it's appropriate- but cannot order without a physician exam of some kind.  He also needs to have some NEW deficits to explain why we would order it.  I'm sorry I cannot order what he wants without supporting evidence. Thank you
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
## 2019-07-29 ENCOUNTER — Telehealth: Payer: Self-pay

## 2019-07-29 NOTE — Telephone Encounter (Signed)
Patient states he has an appt to see the surgeon tomorrow.

## 2019-07-29 NOTE — Telephone Encounter (Signed)
Sharyn Lull OT with [facility] called for verbal orders for [facility] OT. 2 wk 2 , 1 wk 4. Orders approved an given per d/c summary.

## 2019-08-03 ENCOUNTER — Encounter: Payer: Self-pay | Admitting: Physical Medicine and Rehabilitation

## 2019-08-03 ENCOUNTER — Encounter
Payer: BC Managed Care – PPO | Attending: Physical Medicine and Rehabilitation | Admitting: Physical Medicine and Rehabilitation

## 2019-08-03 ENCOUNTER — Other Ambulatory Visit: Payer: Self-pay

## 2019-08-03 VITALS — BP 90/60 | HR 84 | Temp 97.5°F | Ht 71.0 in

## 2019-08-03 DIAGNOSIS — G8220 Paraplegia, unspecified: Secondary | ICD-10-CM | POA: Insufficient documentation

## 2019-08-03 DIAGNOSIS — R252 Cramp and spasm: Secondary | ICD-10-CM | POA: Diagnosis not present

## 2019-08-03 DIAGNOSIS — K592 Neurogenic bowel, not elsewhere classified: Secondary | ICD-10-CM | POA: Insufficient documentation

## 2019-08-03 DIAGNOSIS — N319 Neuromuscular dysfunction of bladder, unspecified: Secondary | ICD-10-CM | POA: Insufficient documentation

## 2019-08-03 MED ORDER — BACLOFEN 10 MG PO TABS
10.0000 mg | ORAL_TABLET | Freq: Three times a day (TID) | ORAL | 5 refills | Status: DC
Start: 2019-08-03 — End: 2020-03-02

## 2019-08-03 NOTE — Progress Notes (Signed)
Subjective:    Patient ID: Timothy Davenport, male    DOB: 28-Dec-1950, 69 y.o.   MRN: [id number]  HPI  Patient is a T8 ASIA B- myelopathy, neurogenic bowel and bladder, and spasticity-    No pain at all.  Has Foley catheter- had a problem cathing- and blood came out- went to hospital. No urine out- just blood out- foley placed.    Had UTI- got in ER and got Rx filled- got Rx for Cipro-   Can move toes now with PT Not doing estim. H/H currently.   Trying to get [facility] transportation.  Needs paperwork filled out.   Doing well otherwise.  Has a little sore on butt- trying Desitin- less than dime sized- was bright red- in sacrum/crack/coccyx- doing much better.   Has more muscle spasms in RLE>LLE- meds helpful  Pain Inventory Average Pain 5 Pain Right Now 1 My pain is intermittent and tingling  In the last 24 hours, has pain interfered with the following? General activity 1 Relation with others 2 Enjoyment of life 2 What TIME of day is your pain at its worst? night Sleep (in general) Good  Pain is worse with: . Pain improves with: medication Relief from Meds: 7  Mobility do you drive?  no use a wheelchair needs help with transfers Do you have any goals in this area?  yes  Function retired I need assistance with the following:  bathing and toileting  Neuro/Psych bladder control problems bowel control problems spasms depression  Prior Studies transitional  Physicians involved in your care transitional   Family History  Problem Relation Age of Onset  . Hypertension Mother   . Hypertension Father    Social History   Socioeconomic History  . Marital status: Married    Spouse name: Not on file  . Number of children: Not on file  . Years of education: Not on file  . Highest education level: Not on file  Occupational History  . Not on file  Tobacco Use  . Smoking status: Never Smoker  . Smokeless tobacco: Never Used  Vaping Use  . Vaping Use: Never  used  Substance and Sexual Activity  . Alcohol use: Yes    Comment: 2-3 drinks week  . Drug use: Yes    Types: Marijuana    Comment: occasionally  . Sexual activity: Not Currently  Other Topics Concern  . Not on file  Social History Narrative  . Not on file   Social Determinants of Health   Financial Resource Strain:   . Difficulty of Paying Living Expenses:   Food Insecurity:   . Worried About Charity fundraiser in the Last Year:   . Arboriculturist in the Last Year:   Transportation Needs:   . Film/video editor (Medical):   [name] Lack of Transportation (Non-Medical):   Physical Activity:   . Days of Exercise per Week:   . Minutes of Exercise per Session:   Stress:   . Feeling of Stress :   Social Connections:   . Frequency of Communication with Friends and Family:   . Frequency of Social Gatherings with Friends and Family:   . Attends Religious Services:   . Active Member of Clubs or Organizations:   . Attends Archivist Meetings:   [name] Marital Status:    Past Surgical History:  Procedure Laterality Date  . LUMBAR LAMINECTOMY/DECOMPRESSION MICRODISCECTOMY N/A 07/01/2019   Procedure: LUMBAR LAMINECTOMY/DECOMPRESSION MICRODISCECTOMY 1 LEVEL, THORACIC SIX-SEVEN;  Surgeon: Consuella Lose, MD;  Location: [facility];  Service: Neurosurgery;  Laterality: N/A;  LUMBAR LAMINECTOMY/DECOMPRESSION MICRODISCECTOMY 1 LEVEL, THORACIC SIX-SEVEN   . ORIF PATELLA    . THORACIC DISCECTOMY N/A 07/03/2019   Procedure: Evacuation of Thoracic Hematoma;  Surgeon: Consuella Lose, MD;  Location: [facility];  Service: Neurosurgery;  Laterality: N/A;   Past Medical History:  Diagnosis Date  . Colon polyp   . Diabetes mellitus without complication ([place])   . Diabetic neuropathy ([place])   . HTN (hypertension)   . Patella fracture   . Renal calculi    BP 90/60   Pulse 84   Temp (!) 97.5 F (36.4 C)   Ht 5\' 11"  (1.803 m) Comment: patient reported  SpO2 98%   BMI 22.32 kg/m   Opioid  Risk Score:   Fall Risk Score:  `1  Depression screen PHQ 2/9  No flowsheet data found.  Review of Systems  Constitutional: Negative.   HENT: Negative.   Eyes: Negative.   Respiratory: Negative.   Cardiovascular: Negative.   Gastrointestinal:       Neurogenic bowel  Endocrine: Negative.   Genitourinary:       Neurogenic bladder  Musculoskeletal: Positive for arthralgias and back pain.       Spasms  Allergic/Immunologic: Negative.   Psychiatric/Behavioral: Positive for dysphoric mood.  All other systems reviewed and are negative.      Objective:   Physical Exam  Awake, alert, appropriate, accompanied by wife, sitting in transport w/c from front lobby- no cushion, foley, NAD MS: LEs- R- HF 0/5, KE 2-/5, DF 1/5, PF 1/5 LLE- HF 0/5, KE ~2/5, DF 2-/5, PF 2-/5      Assessment & Plan:   Patient is a T8 ASIA B- myelopathy, neurogenic bowel and bladder, and spasticity-    1. Can Stop Cipro/antibiotics.   2. Has appointment with Urology- Dr. Karsten Ro- [place].   3. Ask him if comfortable with Neuro-Urology- Dr Matilde Sprang- that is a Neuro-Urologist.   4. U/A results-  Had large RBCs, (-) nitrites, small leukocytes, >50 RBCs, WBC 21-50, rare bacteria and Cx grew nothing  5. Suggest PT do Electrical stimulation for LEs- when gets to outpatient. .    6. Sacrum/crack area ulcers/sores are due to too much pressure in bed;  Sores in areas in meat of buttocks are due to not doing enough pressure relief in w/c   7. Do pressure relief every 20 minutes or so.  8.  Increase baclofen 10 mg 3x/day. For muscle spasms.   9. Call me when done with H/H- and I will order outpatient PT-   10. F/U in 39months   I spent a total of 40 minutes on this visit- as detailed above.

## 2019-08-03 NOTE — Patient Instructions (Signed)
Patient is a T8 ASIA B- myelopathy, neurogenic bowel and bladder, and spasticity-    1. Can Stop Cipro/antibiotics.   2. Has appointment with Urology- Dr. Karsten Ro- [place].   3. Ask him if comfortable with Neuro-Urology- Dr Matilde Sprang- that is a Neuro-Urologist.   4. U/A results-  Had large RBCs, (-) nitrites, small leukocytes, >50 RBCs, WBC 21-50, rare bacteria and Cx grew nothing  5. Suggest PT do Electrical stimulation for LEs- when gets to outpatient. .    6. Sacrum/crack area ulcers/sores are due to too much pressure in bed;  Sores in areas in meat of buttocks are due to not doing enough pressure relief in w/c   7. Do pressure relief every 20 minutes or so.  8.  Increase baclofen 10 mg 3x/day. For muscle spasms.   9. Call me when done with H/H- and I will order outpatient PT-   10. F/U in 14months

## 2019-08-04 ENCOUNTER — Telehealth: Payer: Self-pay

## 2019-08-04 MED ORDER — TAMSULOSIN HCL 0.4 MG PO CAPS: 0.4000 mg | ORAL_CAPSULE | Freq: Every day | ORAL | 0 refills | Status: DC

## 2019-08-04 NOTE — Telephone Encounter (Signed)
[name], wife of patient called requesting refill for Tamsulosin 0.4 mg patient will be out today. She mentioned that Reesa Chew, PA advised patient to take 2 tablets instead of one tablets because he was still having difficulty so he ran out early. According to discharge note patient is to take Tamsulosin 0.4 mg 2 tablets daily. I have sent in prescription for Tamsulosin 0.4 mg 2 tablet daily #60 no refill. Patient has appt with Urologist.

## 2019-08-10 ENCOUNTER — Telehealth: Payer: Self-pay

## 2019-08-10 NOTE — Telephone Encounter (Signed)
Timothy Davenport is trying to get funding from the [place] for home improvements.  Per patient you told him the sun is not his friend. His ideal  environmental temperature should be between 71-75 degrees.  He has requested documentation for the [facility] to support his home improvement.   Please advise.

## 2019-08-11 ENCOUNTER — Other Ambulatory Visit: Payer: Self-pay | Admitting: Physical Medicine and Rehabilitation

## 2019-08-14 NOTE — Telephone Encounter (Signed)
Called pt and discussed at length- will send him to SCI center in [place], [place] for [place] through [place]- have placed referral

## 2019-08-15 ENCOUNTER — Other Ambulatory Visit: Payer: Self-pay | Admitting: Physical Medicine and Rehabilitation

## 2019-08-18 ENCOUNTER — Emergency Department (HOSPITAL_COMMUNITY)
Admission: EM | Admit: 2019-08-18 | Discharge: 2019-08-19 | Disposition: A | Discharge status: Home / Self Care | Payer: No Typology Code available for payment source | Attending: Emergency Medicine | Admitting: Emergency Medicine

## 2019-08-18 ENCOUNTER — Other Ambulatory Visit: Payer: Self-pay

## 2019-08-18 ENCOUNTER — Encounter (HOSPITAL_COMMUNITY): Payer: Self-pay | Admitting: Emergency Medicine

## 2019-08-18 DIAGNOSIS — F1210 Cannabis abuse, uncomplicated: Secondary | ICD-10-CM | POA: Insufficient documentation

## 2019-08-18 DIAGNOSIS — Z7984 Long term (current) use of oral hypoglycemic drugs: Secondary | ICD-10-CM | POA: Insufficient documentation

## 2019-08-18 DIAGNOSIS — I10 Essential (primary) hypertension: Secondary | ICD-10-CM | POA: Insufficient documentation

## 2019-08-18 DIAGNOSIS — E1140 Type 2 diabetes mellitus with diabetic neuropathy, unspecified: Secondary | ICD-10-CM | POA: Diagnosis not present

## 2019-08-18 DIAGNOSIS — Z79899 Other long term (current) drug therapy: Secondary | ICD-10-CM | POA: Insufficient documentation

## 2019-08-18 DIAGNOSIS — R319 Hematuria, unspecified: Secondary | ICD-10-CM | POA: Diagnosis present

## 2019-08-18 DIAGNOSIS — R310 Gross hematuria: Secondary | ICD-10-CM

## 2019-08-18 NOTE — ED Triage Notes (Signed)
Pt BIB [facility] from home, pt self caths q4hrs, today he noticed blood on the catheter and in his urine x 1. Denies any pain/other symptoms. Hx SCI.

## 2019-08-19 DIAGNOSIS — R310 Gross hematuria: Secondary | ICD-10-CM | POA: Diagnosis not present

## 2019-08-19 LAB — URINALYSIS, ROUTINE W REFLEX MICROSCOPIC
Bilirubin Urine: NEGATIVE
Glucose, UA: NEGATIVE mg/dL
Hgb urine dipstick: NEGATIVE
Ketones, ur: NEGATIVE mg/dL
Leukocytes,Ua: NEGATIVE
Nitrite: NEGATIVE
Protein, ur: 30 mg/dL — AB
Specific Gravity, Urine: 1.027 (ref 1.005–1.030)
pH: 5 (ref 5.0–8.0)

## 2019-08-19 NOTE — Discharge Instructions (Addendum)
You were seen today for bloody urine.  This may be related to trauma from Foley catheterization.  You do not appear to have a urinary tract infection but a urinary culture was sent.  Follow-up with urology.  Continue to self cath 4 times daily.

## 2019-08-19 NOTE — ED Provider Notes (Signed)
[facility] EMERGENCY DEPARTMENT Provider Note   CSN: [id number] Arrival date & time: 08/18/19  1503     History Chief Complaint  Patient presents with  . Hematuria    Timothy Davenport is a 69 y.o. male.  HPI     This is a 69 year old male with history of diabetes, hypertension, recent paraplegia secondary to fall who presents with hematuria.  Patient self caths 4 times daily.  Patient reports that he was catheterizing himself and noted blood.  This is happened before and he was treated for urinary tract infection.  He denies any abdominal pain, nausea, vomiting, flank pain, fevers.  He has been in the waiting room for over 12 hours and states that he is leaking from his penis because he is not been able to catheterize himself.  Chart reviewed.  Patient did present for hematuria and difficulty catheterizing himself back in June.  A Foley catheter was placed and there was concerned that he may have created a false tract.  He was also treated for UTI with Cipro.  Past Medical History:  Diagnosis Date  . Colon polyp   . Diabetes mellitus without complication ([place])   . Diabetic neuropathy ([place])   . HTN (hypertension)   . Patella fracture   . Renal calculi     Patient Active Problem List   Diagnosis Date Noted  . Spasticity 08/03/2019  . Paraplegia ([place]) 07/07/2019  . Neurogenic bowel 07/07/2019  . Neurogenic bladder 07/07/2019  . 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 LUMBAR LAMINECTOMY/DECOMPRESSION MICRODISCECTOMY N/A 07/01/2019   Procedure: LUMBAR LAMINECTOMY/DECOMPRESSION MICRODISCECTOMY 1 LEVEL, THORACIC SIX-SEVEN;  Surgeon: Consuella Lose, MD;  Location: [facility];  Service: Neurosurgery;  Laterality: N/A;  LUMBAR LAMINECTOMY/DECOMPRESSION MICRODISCECTOMY 1 LEVEL, THORACIC SIX-SEVEN   . ORIF PATELLA    . THORACIC DISCECTOMY N/A 07/03/2019   Procedure: Evacuation of Thoracic Hematoma;  Surgeon: Consuella Lose, MD;  Location: [facility];  Service: Neurosurgery;  Laterality: N/A;       Family History  Problem Relation Age of Onset  . Hypertension Mother   . Hypertension Father     Social History   Tobacco Use  . Smoking status: Never Smoker  . Smokeless tobacco: Never Used  Vaping Use  . Vaping Use: Never used  Substance Use Topics  . Alcohol use: Yes    Comment: 2-3 drinks week  . Drug use: Yes    Types: Marijuana    Comment: occasionally    Home Medications Prior to Admission medications   Medication Sig Start Date End Date Taking? Authorizing Provider  bisacodyl (DULCOLAX) 10 MG suppository UNWRAP AND INSERT 1 SUPPOSITORY(10 MG) RECTALLY DAILY AFTER SUPPER 08/17/19   Raulkar, Clide Deutscher, MD  gabapentin (NEURONTIN) 300 MG capsule TAKE 1 CAPSULE(300 MG) BY MOUTH THREE TIMES DAILY 08/12/19   Lovorn, Jinny Blossom, MD  hydrALAZINE (APRESOLINE) 10 MG tablet TAKE 1 TABLET(10 MG) BY MOUTH TWICE DAILY 08/12/19   Lovorn, Jinny Blossom, MD  pravastatin (PRAVACHOL) 20 MG tablet TAKE 1 TABLET(20 MG) BY MOUTH DAILY 08/12/19   Lovorn, Jinny Blossom, MD  acetaminophen (TYLENOL) 325 MG tablet Take 1-2 tablets (325-650 mg total) by mouth every 4 (four) hours as needed for mild pain. 07/20/19   Love, Ivan Anchors, PA-C  amLODipine (NORVASC) 10 MG tablet Take 10 mg by mouth daily.    [provider]  baclofen (LIORESAL) 10 MG tablet Take 1 tablet (10 mg total) by mouth 3 (three) times  daily. 08/03/19   Lovorn, Jinny Blossom, MD  ciprofloxacin (CIPRO) 500 MG tablet Take 1 tablet (500 mg total) by mouth 2 (two) times daily. 07/23/19   Isla Pence, MD  enoxaparin (LOVENOX) 40 MG/0.4ML injection Inject 0.4 mLs (40 mg total) into the skin daily. 07/21/19   Love, Ivan Anchors, PA-C  glipiZIDE (GLUCOTROL) 10 MG tablet Take 1 tablet (10 mg total) by mouth daily before breakfast. 07/21/19   Love, Ivan Anchors, PA-C  losartan (COZAAR) 100 MG tablet Take 100 mg by mouth daily.    [provider]  omeprazole (PRILOSEC) 20 MG capsule Take 20  mg by mouth daily before breakfast.    [provider]  pioglitazone (ACTOS) 30 MG tablet Take 30 mg by mouth daily.    [provider]  polyvinyl alcohol (LIQUIFILM TEARS) 1.4 % ophthalmic solution Place 1 drop into both eyes 4 (four) times daily. 07/20/19   Love, Ivan Anchors, PA-C  senna-docusate (SENOKOT-S) 8.6-50 MG tablet Take 2 tablets by mouth daily with breakfast. 07/21/19   Love, Ivan Anchors, PA-C  tamsulosin (FLOMAX) 0.4 MG CAPS capsule Take 1 capsule (0.4 mg total) by mouth daily. 08/04/19   Lovorn, Jinny Blossom, MD  traMADol (ULTRAM) 50 MG tablet Take 1 tablet (50 mg total) by mouth every 6 (six) hours as needed for moderate pain. 07/20/19   Bary Leriche, PA-C    Allergies    Patient has no known allergies.  Review of Systems   Review of Systems  Constitutional: Negative for fever.  Respiratory: Negative for shortness of breath.   Cardiovascular: Negative for chest pain.  Gastrointestinal: Negative for abdominal pain, nausea and vomiting.  Genitourinary: Positive for hematuria. Negative for flank pain.  All other systems reviewed and are negative.   Physical Exam Updated Vital Signs BP 113/70 (BP Location: Left Arm)   Pulse 83   Temp 99.6 F (37.6 C) (Oral)   Resp 18   SpO2 100%   Physical Exam Vitals and nursing note reviewed.  Constitutional:      Appearance: He is well-developed. He is not ill-appearing.     Comments: Chronically ill-appearing but nontoxic  HENT:     Head: Normocephalic and atraumatic.     Nose: Nose normal.     Mouth/Throat:     Mouth: Mucous membranes are moist.  Eyes:     Pupils: Pupils are equal, round, and reactive to light.  Cardiovascular:     Rate and Rhythm: Normal rate and regular rhythm.     Heart sounds: Normal heart sounds.  Pulmonary:     Effort: Pulmonary effort is normal. No respiratory distress.     Breath sounds: Normal breath sounds. No wheezing.  Abdominal:     General: Bowel sounds are normal.     Palpations:  Abdomen is soft.     Tenderness: There is no abdominal tenderness. There is no rebound.     Comments: Slight distention suprapubic  Musculoskeletal:     Cervical back: Neck supple.     Right lower leg: No edema.     Left lower leg: No edema.  Lymphadenopathy:     Cervical: No cervical adenopathy.  Skin:    General: Skin is warm and dry.  Neurological:     Mental Status: He is alert and oriented to person, place, and time.  Psychiatric:        Mood and Affect: Mood normal.     ED Results / Procedures / Treatments   Labs (all labs ordered are  listed, but only abnormal results are displayed) Labs Reviewed  URINALYSIS, ROUTINE W REFLEX MICROSCOPIC - Abnormal; Notable for the following components:      Result Value   APPearance HAZY (*)    Protein, ur 30 (*)    Bacteria, UA RARE (*)    All other components within normal limits  URINE CULTURE    EKG None  Radiology No results found.  Procedures Procedures (including critical care time)  Medications Ordered in ED Medications - No data to display  ED Course  I have reviewed the triage vital signs and the nursing notes.  Pertinent labs & imaging results that were available during my care of the patient were reviewed by me and considered in my medical decision making (see chart for details).    MDM Rules/Calculators/A&P                           Patient presents with concerns for hematuria after self cath.  He is overall nontoxic and vital signs are reassuring.  Afebrile.  He has had some overflow incontinence while in the waiting room as he has not been able to catheterize himself in approximately 12 hours.  Request that nursing catheterize him and we will send a urine for analysis.  He has no other symptoms.  Previously seen for the same and treated for UTI.  Also thought to may have created a false tract.  He was due to see Dr. Bernerd Limbo, urology.  Urinalysis reviewed.  0-5 white cells, 0-5 red cells, rare bacteria.  Not  consistent with UTI.  No significant blood.  Continue to monitor closely.  Follow-up with urology.  Urine culture was sent.  At this time do not feel he needs further evaluation or imaging.  Suspect noted hematuria at home may be related to trauma from catheterization.  After history, exam, and medical workup I feel the patient has been appropriately medically screened and is safe for discharge home. Pertinent diagnoses were discussed with the patient. Patient was given return precautions.  Final Clinical Impression(s) / ED Diagnoses Final diagnoses:  Gross hematuria    Rx / DC Orders ED Discharge Orders    None       Hendrix Console, Barbette Hair, MD 08/19/19 0400

## 2019-08-20 LAB — URINE CULTURE: Culture: >=100000 — AB

## 2019-08-21 ENCOUNTER — Other Ambulatory Visit: Payer: Self-pay

## 2019-08-21 MED ORDER — BISACODYL 10 MG RE SUPP: RECTAL | 0 refills | Status: AC

## 2019-08-21 NOTE — Telephone Encounter (Signed)
Patient requesting a refill Bisacodyl 10 MG.  Please advise Dr. Dagoberto Ligas is out of the office this week.

## 2019-08-25 ENCOUNTER — Telehealth: Payer: Self-pay

## 2019-08-27 ENCOUNTER — Other Ambulatory Visit: Payer: Self-pay | Admitting: Physical Medicine and Rehabilitation

## 2019-08-27 MED ORDER — TRAMADOL HCL 50 MG PO TABS: 50.0000 mg | ORAL_TABLET | Freq: Four times a day (QID) | ORAL | 0 refills | Status: DC | PRN

## 2019-08-27 NOTE — Telephone Encounter (Signed)
Dr. Dagoberto Ligas patient,  repeated telephone calls from patients wife stating that he needs a refill on his tramadol. Asks  If physician covering for Dr. Dagoberto Ligas woould please call them.  [phone]

## 2019-08-27 NOTE — Telephone Encounter (Signed)
Sent and calling now

## 2019-09-02 ENCOUNTER — Other Ambulatory Visit: Payer: Self-pay | Admitting: Physical Medicine and Rehabilitation

## 2019-09-02 ENCOUNTER — Telehealth: Payer: Self-pay | Admitting: Physical Medicine and Rehabilitation

## 2019-09-02 NOTE — Telephone Encounter (Signed)
Per [facility] pharmacy Tramadol needs prior authorization from [facility].  Left patient a message letting him know that Yvone Neu is working on getting that approved, and any questions to call our office.

## 2019-09-09 ENCOUNTER — Telehealth: Payer: Self-pay

## 2019-09-09 DIAGNOSIS — G8220 Paraplegia, unspecified: Secondary | ICD-10-CM

## 2019-09-09 NOTE — Telephone Encounter (Signed)
Mr. Timothy Davenport called back. He spoke to the facility in [place]. He has decided not to go there. Patient would like to get  help here in [name].

## 2019-09-09 NOTE — Telephone Encounter (Signed)
[name] called:   He has completed in-home PT & OT.  Mr. Timothy Davenport wanted to know how soon he could start PT outside of the home? He is also interested in the [facility] in [place].   Please advise. Thank you.

## 2019-09-09 NOTE — Telephone Encounter (Signed)
Called pt and ordered outpt PT for him as wlel- also gave him contact info at [facility]

## 2019-09-09 NOTE — Addendum Note (Signed)
Addended by: Courtney Heys on: 09/09/2019 12:59 PM   Modules accepted: Orders

## 2019-09-09 NOTE — Telephone Encounter (Signed)
Timothy Davenport has would like you to call her. Timothy Davenport is having a hard time understanding his PT options & therapies.   She is willing to bring him in for an office visit if needed to discuss options.   Please advise if an office visit is needed.  Call back phone number is [phone].

## 2019-09-09 NOTE — Telephone Encounter (Signed)
That's fine- I already placed OUTPATIENT PT order for here in [name]- I told him he doesn't have to go to [place], [place]- but they need to know of him, so they can be available if he needs them.  I have ordered PT outpatient- they will call him to set up.

## 2019-09-10 NOTE — Telephone Encounter (Signed)
I notified Mr Timothy Davenport but he has decided he DOES want to go to [place].  His wife has questions and asked that Dr Dagoberto Ligas give her a call.

## 2019-09-10 NOTE — Telephone Encounter (Signed)
Called and spoke to pt's wife and gave them information on the [place] program so they can make a decision.

## 2019-09-12 ENCOUNTER — Other Ambulatory Visit: Payer: Self-pay | Admitting: Physical Medicine and Rehabilitation

## 2019-09-16 ENCOUNTER — Telehealth: Payer: Self-pay | Admitting: *Deleted

## 2019-09-16 ENCOUNTER — Telehealth: Payer: Self-pay

## 2019-09-16 ENCOUNTER — Telehealth: Payer: Self-pay | Admitting: Gastroenterology

## 2019-09-16 MED ORDER — TRAMADOL HCL 50 MG PO TABS: 50.0000 mg | ORAL_TABLET | Freq: Four times a day (QID) | ORAL | 3 refills | Status: DC | PRN

## 2019-09-16 NOTE — Telephone Encounter (Signed)
Refilled tramadol- #90- 1 RF

## 2019-09-16 NOTE — Telephone Encounter (Signed)
Timothy Davenport is calling back requesting Dr Dagoberto Ligas call him again about therapies and is asking for a refill on his tramadol.

## 2019-09-16 NOTE — Telephone Encounter (Signed)
Timothy Davenport has been informed that Rx Tramadol has been sent to the pharmacy.   She has been advised to make a group appointment if [name] is still having a problem with his therapy decision. Or have question about it.

## 2019-09-16 NOTE — Telephone Encounter (Signed)
HI Dr. Havery Moros,  We received a referral from the [facility] for a repeat colonoscopy. 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 I obtained the Op\Path reports in addition to the [place] records sent for you to review.   Please advise on scheduling.  Thank you

## 2019-09-17 ENCOUNTER — Ambulatory Visit
Payer: No Typology Code available for payment source | Attending: Physical Medicine and Rehabilitation | Admitting: Physical Therapy

## 2019-09-17 ENCOUNTER — Other Ambulatory Visit: Payer: Self-pay

## 2019-09-17 DIAGNOSIS — R29818 Other symptoms and signs involving the nervous system: Secondary | ICD-10-CM | POA: Insufficient documentation

## 2019-09-17 DIAGNOSIS — R293 Abnormal posture: Secondary | ICD-10-CM

## 2019-09-17 DIAGNOSIS — M6281 Muscle weakness (generalized): Secondary | ICD-10-CM | POA: Insufficient documentation

## 2019-09-17 DIAGNOSIS — G8222 Paraplegia, incomplete: Secondary | ICD-10-CM | POA: Diagnosis present

## 2019-09-17 NOTE — Therapy (Addendum)
[facility] [address] [place], [name], [address] Phone: [phone]   Fax:  [phone]  Physical Therapy Evaluation  Patient Details  Name: Timothy Davenport MRN: [id number] Date of Birth: 09/24/1950 Referring Provider (PT): Alcus Dad, DO   Encounter Date: 09/17/2019    09/17/19 1639  PT Visits / Re-Eval  Visit Number 1  Number of Visits 16  Date for PT Re-Evaluation 01/19/20 (15 visits in 120 days)  Authorization  Authorization Type [facility]  Authorization - Visit Number 0  Authorization - Number of Visits 15  Progress Note Due on Visit 10  PT Time Calculation  PT Start Time 0930  PT Stop Time 1015  PT Time Calculation (min) 45 min  PT - End of Session  Equipment Utilized During Treatment Gait belt  Activity Tolerance Patient tolerated treatment well  Behavior During Therapy [facility] for tasks assessed/performed    Past Medical History:  Diagnosis Date  . Colon polyp   . Diabetes mellitus without complication ([place])   . Diabetic neuropathy ([place])   . HTN (hypertension)   . Patella fracture   . Renal calculi     Past Surgical History:  Procedure Laterality Date  . LUMBAR LAMINECTOMY/DECOMPRESSION MICRODISCECTOMY N/A 07/01/2019   Procedure: LUMBAR LAMINECTOMY/DECOMPRESSION MICRODISCECTOMY 1 LEVEL, THORACIC SIX-SEVEN;  Surgeon: Consuella Lose, MD;  Location: [facility];  Service: Neurosurgery;  Laterality: N/A;  LUMBAR LAMINECTOMY/DECOMPRESSION MICRODISCECTOMY 1 LEVEL, THORACIC SIX-SEVEN   . ORIF PATELLA    . THORACIC DISCECTOMY N/A 07/03/2019   Procedure: Evacuation of Thoracic Hematoma;  Surgeon: Consuella Lose, MD;  Location: [facility];  Service: Neurosurgery;  Laterality: N/A;    There were no vitals filed for this visit.    Subjective Assessment - 09/17/19 0934    Subjective Admitted to hospital on 06/30/19 after a fall with inability to move LLE and worsening of RLE weakness.  MRI thoracic spine showed  severe degenerative stenosis with bulky disc protrusion and severe cord compression at T6/T72follow-up MRI showed large hematoma in laminectomy bed and paraspinal soft tissues resulting in severe cord compression in conjunction to residual small disc protrusion and cord edema from T5 to mid T7 Pt diagnosed with T8 paraplegia (ASIA B). Discharged from the hospital on 07/20/19, received in patient rehab and then received HHPT and OT and finished up last week. HHPT was working on standing with the RW - able to stand about 30 seconds. Transferring at home with the sliding board. Had a sore on his buttocks, but reports it has healed. Has a one day PT evaluation/appointment in [place] in October. reports he will be getting these every year. Also states getting return of strength in BLE.    Patient is accompained by: Family member   wife, [name]   Pertinent History T8 paraplegia ASIA B, diabetes, HTN    How long can you stand comfortably? reports he can stand for 30 seconds with a RW (with HHPT)    Patient Stated Goals wants to get out of here walking.    Currently in Pain? No/denies              [facility] PT Assessment - 09/17/19 0943      Assessment   Medical Diagnosis T8 paraplegia    Referring Provider (PT) Piva, Enrico,DO    Onset Date/Surgical Date 06/30/19    Hand Dominance Right    Prior Therapy CIR OT and PT and HHPT and OT      Precautions   Precautions Fall  Balance Screen   Has the patient fallen in the past 6 months Yes    How many times? 1   fell out of his wheelchair,going up handicap ramp   Has the patient had a decrease in activity level because of a fear of falling?  Yes    Is the patient reluctant to leave their home because of a fear of falling?  No      Home Environment   Living Environment Private residence    Living Arrangements Spouse/significant other    Available Help at Discharge Family;Other (Comment)   children and grandchild   Type of [place] - manual;Walker - standard;Grab bars - toilet;Grab bars - tub/shower;Hospital bed;Tub bench    Additional Comments has a home health aide for 16 hours a week - getting up out of bed, getting into shower, cleaning up      Prior Function   Level of Independence Independent;Independent with community mobility with device    Vocation Retired    Vocation Requirements [place] veteran    Leisure loves gardening (plant flowers and vegetable garden) and fishing      Cognition   Overall Cognitive Status Within Functional Limits for tasks assessed      Observation/Other Assessments   Observations pt with approx. 5 beats of clonus R ankle      Sensation   Light Touch Impaired by gross assessment    Proprioception Appears Intact    Additional Comments able to detect light touch BLE with exception of lateral distal thigh on L. reports can feel in buttocks area       Coordination   Gross Motor Movements are Fluid and Coordinated No    Heel Shin Test due to paraplegia      Posture/Postural Control   Posture/Postural Control Postural limitations    Postural Limitations Posterior pelvic tilt      ROM / Strength   AROM / PROM / Strength Strength      Strength   Overall Strength Comments grossly 5/5 MMT of BUE     Strength Assessment Site Hip;Knee;Ankle    Right/Left Hip Right;Left    Right Hip Flexion 2/5    Left Hip Flexion 2+/5    Right/Left Knee Right;Left    Right Knee Flexion 2/5    Right Knee Extension 3-/5    Left Knee Flexion 2+/5    Left Knee Extension 3-/5    Right/Left Ankle Right;Left    Right Ankle Dorsiflexion 2+/5    Right Ankle Plantar Flexion 1/5    Left Ankle Dorsiflexion 3-/5    Left Ankle Plantar Flexion 2/5      Bed Mobility   Bed Mobility Rolling Right;Rolling Left;Supine to Sit;Sit to Supine    Rolling Right Set up assist;Supervision/verbal cueing   assist to place LLE over RLE   Rolling  Left Set up assist;Supervision/Verbal cueing   assist to place RLE over LLE   Supine to Sit Minimal Assistance - Patient > 75%   for BLEs   Sit to Supine Moderate Assistance - Patient 50-74%   for BLEs     Transfers   Transfers Lateral/Scoot Transfers    Lateral/Scoot Transfers With slide board;With armrests removed;5: Supervision    Lateral/Scoot Transfer Details (indicate cue type and reason) needing total assist from therapist for slide board placement, however once placed, pt performs with supervision from w/c <>  mat table     Comments bed mobility: pt performing supine to long sit using BUEs with supervision      Ambulation/Gait   Ambulation/Gait No    Gait Comments not safe to ambulate       Wheelchair Mobility   Wheelchair Mobility Yes    Wheelchair Propulsion Both upper extremities    Wheelchair Parts Management Independent                      Objective measurements completed on examination: See above findings.               PT Education - 09/17/19 1638    Education Details reviewed pressure relief - performing every 15-20 minutes (pt reporting currently performing every hr or 2), clinical findings, POC.    Person(s) Educated Patient;Spouse    Methods Explanation    Comprehension Verbalized understanding              PT Short Term Goals - 09/20/19 2130      PT SHORT TERM GOAL #1   Title Pt and spouse will be independent with initial HEP for BLE stretching and strengthening. ALL STGS DUE 10/25/19    Time 5   due to delay in scheduling   Period Weeks    Status New    Target Date 10/25/19      PT SHORT TERM GOAL #2   Title Pt will perform lateral scoot transfer with min A for use of set up of slide board in order to decr caregiver burden.    Baseline pt requiring total A for set up of slide board.    Time 5    Period Weeks    Status New      PT SHORT TERM GOAL #3   Title Pt will perform standing in [place] with min/mod A for at least 60  seconds for functional weight bearing.    Time 5    Period Weeks    Status New      PT SHORT TERM GOAL #4   Title Pt will verbalize understanding of performing pressure relief in order to decr risk of pressure sores.    Time 5    Period Weeks    Status New      PT SHORT TERM GOAL #5   Title Pt will perform all bed mobility with min guard/supervision in order to decr caregiver burden.    Time 5    Period Weeks    Status New            PT Long Term Goals - 09/20/19 2138      PT LONG TERM GOAL #1   Title Pt and spouse will be independent with final HEP for BLE stretching and strengthening. ALL LTGS DUE 11/22/19    Time 9    Period Weeks    Status New    Target Date 11/22/19      PT LONG TERM GOAL #2   Title Pt will perform all bed mobility with mod I in order to decr caregiver burden.    Time 9    Period Weeks    Status New      PT LONG TERM GOAL #3   Title Pt will perform squat pivot transfer from w/c <> mat table with supervision in order to demo improved functional mobility.    Time 9    Period Weeks    Status New      PT LONG  TERM GOAL #4   Title Pt will perform sit <> stand with RW with mod A in order to demo improved functional mobility.    Time 9    Period Weeks    Status New                    09/17/19 2042  Plan  Clinical Impression Statement Patient is a 70 year old male referred to Neuro OPPT for T8 incomplete paraplegia (pt's chart lists as ASIA B, however pt is getting motor return). Was admitted to hospital on 06/30/19 after a fall with inability to move LLE and worsening of RLE weakness - was found to have severe cord compression. Was discharged from hospital on 07/10/19, received inpatient rehab during stay and then [place] and OT.  Pt's PMH is significant for: diabetes, HTN. The following deficits were present during the exam:  decreased BLE strength, decr BLE ROM, postural abnormalities, decr core strength, impaired sensation, decr coordination,  impaired tone. Pt would benefit from skilled PT to address these impairments and functional55 limitations to maximize functional mobility independence  Personal Factors and Comorbidities Comorbidity 3+;Past/Current Experience  Comorbidities T8 paraplegia, diabetes, HTN  Examination-Activity Limitations Bed Mobility;Locomotion Level;Transfers;Stand  Examination-Participation Restrictions Community Activity;Yard Work  Pt will benefit from skilled therapeutic intervention in order to improve on the following deficits Decreased balance;Decreased coordination;Decreased range of motion;Decreased strength;Decreased mobility;Impaired sensation;Postural dysfunction;Impaired tone;Decreased activity tolerance  Stability/Clinical Decision Making Evolving/Moderate complexity  Clinical Decision Making Moderate  PT Frequency 2x / week  PT Duration  (7 weeks)  PT Treatment/Interventions Aquatic Therapy;Manual techniques;Therapeutic exercise;Gait training;Neuromuscular re-education;Orthotic Fit/Training  PT Next Visit Plan initial HEP for stretching and BLE strength, trial Bioness, core strengthening, try standing in [place]/with RW when able.  Consulted and Agree with Plan of Care Patient;Family member/caregiver  Family Member Consulted wife, [name]   Patient will benefit from skilled therapeutic intervention in order to improve the following deficits and impairments:  Decreased balance, Decreased coordination, Decreased range of motion, Decreased strength, Decreased mobility, Impaired sensation, Postural dysfunction, Impaired tone, Decreased activity tolerance  Visit Diagnosis: Muscle weakness (generalized)  Abnormal posture  Other symptoms and signs involving the nervous system  Paraplegia, incomplete [facility])     Problem List Patient Active Problem List   Diagnosis Date Noted  . Spasticity 08/03/2019  . Paraplegia ([place]) 07/07/2019  . Neurogenic bowel 07/07/2019  . Neurogenic bladder 07/07/2019  .  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 [place], [name], [address] Phone: [phone]   Fax:  [phone]  Name: Timothy Davenport MRN: [id number] Date of Birth: [birth year]

## 2019-09-18 NOTE — Telephone Encounter (Signed)
Dr. Havery Moros reviewed records and indicated that pt needs OV due to being on Plavix and CVA hx. Pt aware that we will call him as soon as Oct calendar become available. Records in referral folder.

## 2019-09-23 ENCOUNTER — Encounter: Payer: Self-pay | Admitting: Gastroenterology

## 2019-09-28 ENCOUNTER — Other Ambulatory Visit: Payer: Self-pay

## 2019-09-28 ENCOUNTER — Encounter
Payer: No Typology Code available for payment source | Attending: Physical Medicine and Rehabilitation | Admitting: Physical Medicine and Rehabilitation

## 2019-09-28 ENCOUNTER — Encounter: Payer: Self-pay | Admitting: Physical Medicine and Rehabilitation

## 2019-09-28 VITALS — BP 112/73 | HR 101 | Temp 98.9°F

## 2019-09-28 DIAGNOSIS — R252 Cramp and spasm: Secondary | ICD-10-CM | POA: Insufficient documentation

## 2019-09-28 DIAGNOSIS — G894 Chronic pain syndrome: Secondary | ICD-10-CM | POA: Diagnosis present

## 2019-09-28 DIAGNOSIS — Z79891 Long term (current) use of opiate analgesic: Secondary | ICD-10-CM | POA: Insufficient documentation

## 2019-09-28 DIAGNOSIS — K592 Neurogenic bowel, not elsewhere classified: Secondary | ICD-10-CM | POA: Diagnosis present

## 2019-09-28 DIAGNOSIS — Z5181 Encounter for therapeutic drug level monitoring: Secondary | ICD-10-CM | POA: Insufficient documentation

## 2019-09-28 DIAGNOSIS — N319 Neuromuscular dysfunction of bladder, unspecified: Secondary | ICD-10-CM | POA: Diagnosis not present

## 2019-09-28 DIAGNOSIS — G8220 Paraplegia, unspecified: Secondary | ICD-10-CM | POA: Insufficient documentation

## 2019-09-28 NOTE — Addendum Note (Signed)
Addended by: Jasmine December T on: 09/28/2019 11:17 AM   Modules accepted: Orders

## 2019-09-28 NOTE — Addendum Note (Signed)
Addended by: Arliss Journey on: 09/28/2019 10:32 AM   Modules accepted: Orders

## 2019-09-28 NOTE — Progress Notes (Signed)
Subjective:    Patient ID: Timothy Davenport, male    DOB: 09-25-1950, 69 y.o.   MRN: [id number]  HPI  Patient is a T8 ASIA B- myelopathy, neurogenic bowel and bladder, and spasticity-   When wakes up- needs to void-  Can urinate out of penis in AM.    Whenever does suppository Feels like needs to void.  Occ having pain- when up for awhile.  In R foot- tingling sensation- gets to the point it's aggravating- tramadol or tylenol helps the Sx's.    Has taken tramadol 2-3x/in last month.     Takes 30-45 minutes to empty gut. Is normal.   Scheduled for colonoscopy.    Bowels are doing well- usually firming up- some days loose depending on what he eats.   Start outpatient therapies tomorrow.     Pain Inventory Average Pain 1 Pain Right Now 1 My pain is tingling  In the last 24 hours, has pain interfered with the following? General activity 0 Relation with others 1 Enjoyment of life 1 What TIME of day is your pain at its worst? evening Sleep (in general) Good  Pain is worse with: inactivity Pain improves with: rest and medication Relief from Meds: 4  Mobility ability to climb steps?  no do you drive?  no use a wheelchair transfers alone  Function disabled: date disabled . I need assistance with the following:  meal prep, household duties and shopping  Neuro/Psych trouble walking  Prior Studies Any changes since last visit?  no  Physicians involved in your care Any changes since last visit?  no   Family History  Problem Relation Age of Onset  . Hypertension Mother   . Hypertension Father    Social History   Socioeconomic History  . Marital status: Married    Spouse name: Not on file  . Number of children: Not on file  . Years of education: Not on file  . Highest education level: Not on file  Occupational History  . Not on file  Tobacco Use  . Smoking status: Never Smoker  . Smokeless tobacco: Never Used  Vaping Use  . Vaping Use: Never  used  Substance and Sexual Activity  . Alcohol use: Yes    Comment: 2-3 drinks week  . Drug use: Yes    Types: Marijuana    Comment: occasionally  . Sexual activity: Not Currently  Other Topics Concern  . Not on file  Social History Narrative  . Not on file   Social Determinants of Health   Financial Resource Strain:   . Difficulty of Paying Living Expenses:   Food Insecurity:   . Worried About Charity fundraiser in the Last Year:   . Arboriculturist in the Last Year:   Transportation Needs:   . Film/video editor (Medical):   [name] Lack of Transportation (Non-Medical):   Physical Activity:   . Days of Exercise per Week:   . Minutes of Exercise per Session:   Stress:   . Feeling of Stress :   Social Connections:   . Frequency of Communication with Friends and Family:   . Frequency of Social Gatherings with Friends and Family:   . Attends Religious Services:   . Active Member of Clubs or Organizations:   . Attends Archivist Meetings:   [name] Marital Status:    Past Surgical History:  Procedure Laterality Date  . LUMBAR LAMINECTOMY/DECOMPRESSION MICRODISCECTOMY N/A 07/01/2019   Procedure: LUMBAR LAMINECTOMY/DECOMPRESSION  MICRODISCECTOMY 1 LEVEL, THORACIC SIX-SEVEN;  Surgeon: Consuella Lose, MD;  Location: [facility];  Service: Neurosurgery;  Laterality: N/A;  LUMBAR LAMINECTOMY/DECOMPRESSION MICRODISCECTOMY 1 LEVEL, THORACIC SIX-SEVEN   . ORIF PATELLA    . THORACIC DISCECTOMY N/A 07/03/2019   Procedure: Evacuation of Thoracic Hematoma;  Surgeon: Consuella Lose, MD;  Location: [facility];  Service: Neurosurgery;  Laterality: N/A;   Past Medical History:  Diagnosis Date  . Colon polyp   . Diabetes mellitus without complication ([place])   . Diabetic neuropathy ([place])   . HTN (hypertension)   . Patella fracture   . Renal calculi    BP 112/73   Pulse (!) 101   Temp 98.9 F (37.2 C)   SpO2 98%   Opioid Risk Score:   Fall Risk Score:  `1  Depression screen PHQ  2/9  Depression screen PHQ 2/9 08/03/2019  Decreased Interest 3  Down, Depressed, Hopeless 0  PHQ - 2 Score 3  Altered sleeping 0  Tired, decreased energy 0  Change in appetite 1  Feeling bad or failure about yourself  0  Trouble concentrating 0  Moving slowly or fidgety/restless 0  Suicidal thoughts 0  PHQ-9 Score 4  Difficult doing work/chores Not difficult at all    Review of Systems  Musculoskeletal: Positive for gait problem.  All other systems reviewed and are negative.      Objective:   Physical Exam  Awake, alert, appropriate, accompanied by wife, NAD MAS of 1+ to 2 in flexion, not extension of knees- Hips MAS of 1      Assessment & Plan:    1. Can stop Lovenox as of 10/02/19- that's the shot.   2. Can stop Flomax/Tamsuolosin. Doesn't need to continue anymore.   3. Is scheduled for colonoscopy- suggest needs to wait 1 year- then we discuss if he wants to go to [place], [place] for that. Cancel current appointment.    4. Discussed [place], [place].   5. Continue Baclofen 10 mg 3x/day- don't need to change dose as of this time. Explained over first 2 years common to increase the dose.    6. Gabapentin 300 mg 1 tab 3x/day-   7. Tramadol 50 mg 3x/day AS NEEDED- so will need to fill out opiate form.   8. Suggest continue Sertraline 100 mg daily- for 6 months total - will try to wean  After Christmas.   9. Continue Laxative/Stimulant- can try to go to 1 tab/day and see how it goes.    10. Other meds via PCP, etc.   11. Suggest PT/OT to work on patient being able to do his own bowel program.    12. F/U in 2 months   I spent a total of 35 minutes on visit- as detailed above.

## 2019-09-28 NOTE — Patient Instructions (Signed)
  1. Can stop Lovenox as of 10/02/19- that's the shot.   2. Can stop Flomax/Tamsuolosin. Doesn't need to continue anymore.   3. Is scheduled for colonoscopy- suggest needs to wait 1 year- then we discuss if he wants to go to [place], [place] for that. Cancel current appointment.    4. Discussed [place], [place].   5. Continue Baclofen 10 mg 3x/day- don't need to change dose as of this time. Explained over first 2 years common to increase the dose.    6. Gabapentin 300 mg 1 tab 3x/day-   7. Tramadol 50 mg 3x/day AS NEEDED- so will need to fill out opiate form.   8. Suggest continue Sertraline 100 mg daily- for 6 months total - will try to wean  After Christmas.   9. Continue Laxative/Stimulant- can try to go to 1 tab/day and see how it goes.    10. Other meds via PCP, etc.   11. Suggest PT/OT to work on patient being able to do his own bowel program.    12. F/U in 2 months

## 2019-09-29 ENCOUNTER — Ambulatory Visit
Payer: No Typology Code available for payment source | Attending: Physical Medicine and Rehabilitation | Admitting: Physical Therapy

## 2019-09-29 DIAGNOSIS — M6281 Muscle weakness (generalized): Secondary | ICD-10-CM

## 2019-09-29 DIAGNOSIS — R29818 Other symptoms and signs involving the nervous system: Secondary | ICD-10-CM

## 2019-09-29 DIAGNOSIS — R293 Abnormal posture: Secondary | ICD-10-CM

## 2019-09-29 DIAGNOSIS — G8222 Paraplegia, incomplete: Secondary | ICD-10-CM

## 2019-09-29 NOTE — Therapy (Signed)
[facility] [address] [place], [name], [address] Phone: [phone]   Fax:  [phone]  Physical Therapy Treatment  Patient Details  Name: Timothy Davenport MRN: [id number] Date of Birth: 05/30/1950 Referring Provider (PT): Juel Burrow    Encounter Date: 09/29/2019   PT End of Session - 09/29/19 0808    Visit Number 2    Number of Visits 16    Date for PT Re-Evaluation 01/26/20   15 visits in 120 days   Authorization Type [facility]    Authorization - Visit Number 1    Authorization - Number of Visits 15    Progress Note Due on Visit 10    PT Start Time 0803    PT Stop Time 0845    PT Time Calculation (min) 42 min    Equipment Utilized During Treatment Gait belt    Activity Tolerance Patient tolerated treatment well    Behavior During Therapy [facility] for tasks assessed/performed           Past Medical History:  Diagnosis Date  . Colon polyp   . Diabetes mellitus without complication ([place])   . Diabetic neuropathy ([place])   . HTN (hypertension)   . Patella fracture   . Renal calculi     Past Surgical History:  Procedure Laterality Date  . LUMBAR LAMINECTOMY/DECOMPRESSION MICRODISCECTOMY N/A 07/01/2019   Procedure: LUMBAR LAMINECTOMY/DECOMPRESSION MICRODISCECTOMY 1 LEVEL, THORACIC SIX-SEVEN;  Surgeon: Consuella Lose, MD;  Location: [facility];  Service: Neurosurgery;  Laterality: N/A;  LUMBAR LAMINECTOMY/DECOMPRESSION MICRODISCECTOMY 1 LEVEL, THORACIC SIX-SEVEN   . ORIF PATELLA    . THORACIC DISCECTOMY N/A 07/03/2019   Procedure: Evacuation of Thoracic Hematoma;  Surgeon: Consuella Lose, MD;  Location: [facility];  Service: Neurosurgery;  Laterality: N/A;    There were no vitals filed for this visit.   Subjective Assessment - 09/29/19 0901    Subjective No new complaints. Saw Dr. Arma Heading yesterday who made some med changes. No falls or pain to report.    Patient is accompained by: Family member   spouse, [name]    Pertinent History T8 paraplegia ASIA B, diabetes, HTN    How long can you stand comfortably? reports he can stand for 30 seconds with a RW (with HHPT)    Patient Stated Goals wants to get out of here walking.    Currently in Pain? No/denies                  [facility] Adult PT Treatment/Exercise - 09/29/19 0810      Transfers   Transfers Lateral/Scoot Transfers    Lateral/Scoot Transfers 5: Supervision;With armrests removed;With slide board    Lateral/Scoot Transfer Details (indicate cue type and reason) pt able to self place board with cues on technique when in wheelchair going toward mat table. needed assist for correct placement when going from mat table to wheelchair. once board in place, supervision for pt to slide across with good lifting of buttocks over board.       Exercises   Exercises Other Exercises    Other Exercises  issued ex's to HEP for strengthening. Refer to [facility] for full details. cues on form and technique provided. Spouse shown how to assist pt with hands on practice in session today.           issued the following to HEP today: Access Code: [email or web address] URL: [email or web address] Date: 09/29/2019 Prepared by: Willow Ora  Exercises Small Range Straight Leg Raise - 1 x daily -  5 x weekly - 1 sets - 10 reps Supine Heel Slide - 1 x daily - 5 x weekly - 1 sets - 10 reps Seated Hip Adduction Squeeze with Ball - 1 x daily - 5 x weekly - 1 sets - 10 reps - 5 hold Seated Single Leg Hip Abduction with Resistance - 1 x daily - 5 x weekly - 1 sets - 10 reps Seated Heel Slide - 1 x daily - 5 x weekly - 1 sets - 10 reps        PT Education - 09/29/19 0855    Education Details HEP for strengthening    Person(s) Educated Patient;Spouse    Methods Explanation;Demonstration;Tactile cues;Verbal cues;Handout    Comprehension Verbalized understanding;Returned demonstration            PT Short Term Goals - 09/20/19 2130      PT SHORT TERM GOAL #1    Title Pt and spouse will be independent with initial HEP for BLE stretching and strengthening. ALL STGS DUE 10/25/19    Time 5   due to delay in scheduling   Period Weeks    Status New    Target Date 10/25/19      PT SHORT TERM GOAL #2   Title Pt will perform lateral scoot transfer with min A for use of set up of slide board in order to decr caregiver burden.    Baseline pt requiring total A for set up of slide board.    Time 5    Period Weeks    Status New      PT SHORT TERM GOAL #3   Title Pt will perform standing in [place] with min/mod A for at least 60 seconds for functional weight bearing.    Time 5    Period Weeks    Status New      PT SHORT TERM GOAL #4   Title Pt will verbalize understanding of performing pressure relief in order to decr risk of pressure sores.    Time 5    Period Weeks    Status New      PT SHORT TERM GOAL #5   Title Pt will perform all bed mobility with min guard/supervision in order to decr caregiver burden.    Time 5    Period Weeks    Status New             PT Long Term Goals - 09/20/19 2138      PT LONG TERM GOAL #1   Title Pt and spouse will be independent with final HEP for BLE stretching and strengthening. ALL LTGS DUE 11/22/19    Time 9    Period Weeks    Status New    Target Date 11/22/19      PT LONG TERM GOAL #2   Title Pt will perform all bed mobility with mod I in order to decr caregiver burden.    Time 9    Period Weeks    Status New      PT LONG TERM GOAL #3   Title Pt will perform squat pivot transfer from w/c <> mat table with supervision in order to demo improved functional mobility.    Time 9    Period Weeks    Status New      PT LONG TERM GOAL #4   Title Pt will perform sit <> stand with RW with mod A in order to demo improved functional mobility.    Time 9  Period Weeks    Status New                 Plan - 09/29/19 0809    Clinical Impression Statement Today's skilled session focused on  establishment of an HEP to address strengthening. Spouse present and shown to to assist pt at home with program. No issues noted or reported with performance in session today. The pt is progressing toward goals and should benefit from continued PT to progress toward unmet goals.    Personal Factors and Comorbidities Comorbidity 3+;Past/Current Experience    Comorbidities T8 paraplegia, diabetes, HTN    Examination-Activity Limitations Bed Mobility;Locomotion Level;Transfers;Stand    Examination-Participation Restrictions Community Activity;Yard Work    Merchant navy officer Evolving/Moderate complexity    PT Frequency 2x / week    PT Duration --   7 weeks   PT Treatment/Interventions Aquatic Therapy;Manual techniques;Therapeutic exercise;Gait training;Neuromuscular re-education;Orthotic Fit/Training    PT Next Visit Plan trial Bioness (Chloe set to observe on Wed 8/18 for set up), core strengthening, try standing in [place]/with RW when able.    PT Home Exercise Plan Access Code: [email or web address]    Consulted and Agree with Plan of Care Patient;Family member/caregiver    Family Member Consulted wife, [name]           Patient will benefit from skilled therapeutic intervention in order to improve the following deficits and impairments:  Decreased balance, Decreased coordination, Decreased range of motion, Decreased strength, Decreased mobility, Impaired sensation, Postural dysfunction, Impaired tone, Decreased activity tolerance  Visit Diagnosis: Muscle weakness (generalized)  Abnormal posture  Other symptoms and signs involving the nervous system  Paraplegia, incomplete [facility])     Problem List Patient Active Problem List   Diagnosis Date Noted  . Spasticity 08/03/2019  . Paraplegia ([place]) 07/07/2019  . Neurogenic bowel 07/07/2019  . Neurogenic bladder 07/07/2019  . 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

## 2019-09-29 NOTE — Patient Instructions (Signed)
Access Code: [email or web address] URL: [email or web address] Date: 09/29/2019 Prepared by: Willow Ora  Exercises Small Range Straight Leg Raise - 1 x daily - 5 x weekly - 1 sets - 10 reps Supine Heel Slide - 1 x daily - 5 x weekly - 1 sets - 10 reps Seated Hip Adduction Squeeze with Ball - 1 x daily - 5 x weekly - 1 sets - 10 reps - 5 hold Seated Single Leg Hip Abduction with Resistance - 1 x daily - 5 x weekly - 1 sets - 10 reps Seated Heel Slide - 1 x daily - 5 x weekly - 1 sets - 10 reps

## 2019-10-01 ENCOUNTER — Encounter: Payer: Self-pay | Admitting: Physical Therapy

## 2019-10-01 ENCOUNTER — Other Ambulatory Visit: Payer: Self-pay

## 2019-10-01 ENCOUNTER — Ambulatory Visit: Payer: No Typology Code available for payment source | Admitting: Physical Therapy

## 2019-10-01 DIAGNOSIS — R29818 Other symptoms and signs involving the nervous system: Secondary | ICD-10-CM

## 2019-10-01 DIAGNOSIS — G8222 Paraplegia, incomplete: Secondary | ICD-10-CM

## 2019-10-01 DIAGNOSIS — R293 Abnormal posture: Secondary | ICD-10-CM

## 2019-10-01 DIAGNOSIS — M6281 Muscle weakness (generalized): Secondary | ICD-10-CM | POA: Diagnosis not present

## 2019-10-02 LAB — DRUG TOX MONITOR 1 W/CONF, ORAL FLD
Amphetamines: NEGATIVE ng/mL (ref <10)
Barbiturates: NEGATIVE ng/mL (ref <10)
Benzodiazepines: NEGATIVE ng/mL (ref <0.50)
Buprenorphine: NEGATIVE ng/mL (ref <0.10)
Cocaine: NEGATIVE ng/mL (ref <5.0)
Fentanyl: NEGATIVE ng/mL (ref <0.10)
Heroin Metabolite: NEGATIVE ng/mL (ref <1.0)
MARIJUANA: POSITIVE ng/mL — AB (ref <2.5)
MDMA: NEGATIVE ng/mL (ref <10)
Meprobamate: NEGATIVE ng/mL (ref <2.5)
Methadone: NEGATIVE ng/mL (ref <5.0)
Nicotine Metabolite: NEGATIVE ng/mL (ref <5.0)
Opiates: NEGATIVE ng/mL (ref <2.5)
Phencyclidine: NEGATIVE ng/mL (ref <10)
THC: 16.1 ng/mL — ABNORMAL HIGH (ref <2.5)
Tapentadol: NEGATIVE ng/mL (ref <5.0)
Tramadol: 23.2 ng/mL — ABNORMAL HIGH (ref <5.0)
Tramadol: POSITIVE ng/mL — AB (ref <5.0)
Zolpidem: NEGATIVE ng/mL (ref <5.0)

## 2019-10-02 LAB — DRUG TOX ALC METAB W/CON, ORAL FLD: Alcohol Metabolite: NEGATIVE ng/mL (ref <25)

## 2019-10-02 NOTE — Therapy (Signed)
[facility] [address] [place], [name], [address] Phone: [phone]   Fax:  [phone]  Physical Therapy Treatment  Patient Details  Name: Timothy Davenport MRN: [id number] Date of Birth: 03/24/1950 Referring Provider (PT): Juel Burrow    Encounter Date: 10/01/2019   PT End of Session - 10/01/19 0808    Visit Number 3    Number of Visits 16    Date for PT Re-Evaluation 01/26/20   15 visits in 120 days   Authorization Type VA    Authorization - Visit Number 2    Authorization - Number of Visits 15    Progress Note Due on Visit 10    PT Start Time 0804    PT Stop Time 0845    PT Time Calculation (min) 41 min    Equipment Utilized During Treatment Gait belt    Activity Tolerance Patient tolerated treatment well    Behavior During Therapy [facility] for tasks assessed/performed           Past Medical History:  Diagnosis Date  . Colon polyp   . Diabetes mellitus without complication ([place])   . Diabetic neuropathy ([place])   . HTN (hypertension)   . Patella fracture   . Renal calculi     Past Surgical History:  Procedure Laterality Date  . LUMBAR LAMINECTOMY/DECOMPRESSION MICRODISCECTOMY N/A 07/01/2019   Procedure: LUMBAR LAMINECTOMY/DECOMPRESSION MICRODISCECTOMY 1 LEVEL, THORACIC SIX-SEVEN;  Surgeon: Consuella Lose, MD;  Location: [facility];  Service: Neurosurgery;  Laterality: N/A;  LUMBAR LAMINECTOMY/DECOMPRESSION MICRODISCECTOMY 1 LEVEL, THORACIC SIX-SEVEN   . ORIF PATELLA    . THORACIC DISCECTOMY N/A 07/03/2019   Procedure: Evacuation of Thoracic Hematoma;  Surgeon: Consuella Lose, MD;  Location: [place] [facility];  Service: Neurosurgery;  Laterality: N/A;    There were no vitals filed for this visit.   Subjective Assessment - 10/01/19 0807    Subjective No new complaints. No falls or pain to report. HEP is going well.    Patient is accompained by: Family member    Pertinent History T8 paraplegia ASIA B, diabetes,  HTN    How long can you stand comfortably? reports he can stand for 30 seconds with a RW (with HHPT)    Patient Stated Goals wants to get out of here walking.    Currently in Pain? No/denies                   [facility] Adult PT Treatment/Exercise - 10/01/19 0808      Transfers   Transfers Lateral/Scoot Transfers;Sit to Stand;Stand to Sit    Sit to Stand 3: Mod assist;4: Min assist    Stand to Sit 3: Mod assist;4: Min assist    Lateral/Scoot Transfers 5: Supervision;With armrests removed;With slide board    Lateral/Scoot Transfer Details (indicate cue type and reason) pt able to self place board and transfer from wheelchair to mat table.     Comments sit<>stands in Steady lift assit- 15-20 sec's x 4, then 28 sec's on 5th stand. Heavy UE relaince with cues to engage quads/squeeeze gluts for improved LE support.       Exercises   Exercises Other Exercises    Other Exercises  seated at edge of mat table: long arc quads 2 sets of 10 reps, with red band assisted DF/resisted PF for 2 sets of 10 reps, yellow band hip fall ours for 2 sets of 10 each side. AA with some long arc quads for full knee extension and controlled lowering  each rep.  then seated heel slides with pillow case under pt's foot to allow it to slide easier, working in pt's available ranges only for 2 sets of 10 reps.                      PT Short Term Goals - 09/20/19 2130      PT SHORT TERM GOAL #1   Title Pt and spouse will be independent with initial HEP for BLE stretching and strengthening. ALL STGS DUE 10/25/19    Time 5   due to delay in scheduling   Period Weeks    Status New    Target Date 10/25/19      PT SHORT TERM GOAL #2   Title Pt will perform lateral scoot transfer with min A for use of set up of slide board in order to decr caregiver burden.    Baseline pt requiring total A for set up of slide board.    Time 5    Period Weeks    Status New      PT SHORT TERM GOAL #3   Title Pt will perform  standing in [place] with min/mod A for at least 60 seconds for functional weight bearing.    Time 5    Period Weeks    Status New      PT SHORT TERM GOAL #4   Title Pt will verbalize understanding of performing pressure relief in order to decr risk of pressure sores.    Time 5    Period Weeks    Status New      PT SHORT TERM GOAL #5   Title Pt will perform all bed mobility with min guard/supervision in order to decr caregiver burden.    Time 5    Period Weeks    Status New             PT Long Term Goals - 09/20/19 2138      PT LONG TERM GOAL #1   Title Pt and spouse will be independent with final HEP for BLE stretching and strengthening. ALL LTGS DUE 11/22/19    Time 9    Period Weeks    Status New    Target Date 11/22/19      PT LONG TERM GOAL #2   Title Pt will perform all bed mobility with mod I in order to decr caregiver burden.    Time 9    Period Weeks    Status New      PT LONG TERM GOAL #3   Title Pt will perform squat pivot transfer from w/c <> mat table with supervision in order to demo improved functional mobility.    Time 9    Period Weeks    Status New      PT LONG TERM GOAL #4   Title Pt will perform sit <> stand with RW with mod A in order to demo improved functional mobility.    Time 9    Period Weeks    Status New                 Plan - 10/01/19 7342    Clinical Impression Statement Today's skilled session continued to focus on LE strengthening with rest breaks taken as needed due to fatigue. Then worked on standing tolerance and posture using the [place]. The pt is making steady progress toward goals and should benefit from continued PT to progress toward unmet goals.  Personal Factors and Comorbidities Comorbidity 3+;Past/Current Experience    Comorbidities T8 paraplegia, diabetes, HTN    Examination-Activity Limitations Bed Mobility;Locomotion Level;Transfers;Stand    Examination-Participation Restrictions Community  Activity;Yard Work    Merchant navy officer Evolving/Moderate complexity    PT Frequency 2x / week    PT Duration --   7 weeks   PT Treatment/Interventions Aquatic Therapy;Manual techniques;Therapeutic exercise;Gait training;Neuromuscular re-education;Orthotic Fit/Training    PT Next Visit Plan trial Bioness (Chloe set to observe on Wed 8/18 for set up), core strengthening, try standing in [place]/with RW when able.    PT Home Exercise Plan Access Code: [email or web address]    Consulted and Agree with Plan of Care Patient;Family member/caregiver    Family Member Consulted wife, [name]           Patient will benefit from skilled therapeutic intervention in order to improve the following deficits and impairments:  Decreased balance, Decreased coordination, Decreased range of motion, Decreased strength, Decreased mobility, Impaired sensation, Postural dysfunction, Impaired tone, Decreased activity tolerance  Visit Diagnosis: Muscle weakness (generalized)  Abnormal posture  Other symptoms and signs involving the nervous system  Paraplegia, incomplete [facility])     Problem List Patient Active Problem List   Diagnosis Date Noted  . Spasticity 08/03/2019  . Paraplegia ([place]) 07/07/2019  . Neurogenic bowel 07/07/2019  . Neurogenic bladder 07/07/2019  . Thoracic myelopathy 06/30/2019    Willow Ora, PTA, [facility] Outpatient Neuro [facility] [address], [place], [place] [address] [phone] 10/02/19, 11:59 AM   Name: LENVILLE HIBBERD MRN: [id number] Date of Birth: [birth year]

## 2019-10-07 ENCOUNTER — Telehealth: Payer: Self-pay | Admitting: *Deleted

## 2019-10-07 ENCOUNTER — Ambulatory Visit: Payer: No Typology Code available for payment source | Admitting: Physical Therapy

## 2019-10-07 ENCOUNTER — Other Ambulatory Visit: Payer: Self-pay

## 2019-10-07 ENCOUNTER — Other Ambulatory Visit: Payer: Self-pay | Admitting: Physical Medicine and Rehabilitation

## 2019-10-07 ENCOUNTER — Encounter: Payer: Self-pay | Admitting: Physical Therapy

## 2019-10-07 DIAGNOSIS — R293 Abnormal posture: Secondary | ICD-10-CM

## 2019-10-07 DIAGNOSIS — R29818 Other symptoms and signs involving the nervous system: Secondary | ICD-10-CM

## 2019-10-07 DIAGNOSIS — M6281 Muscle weakness (generalized): Secondary | ICD-10-CM | POA: Diagnosis not present

## 2019-10-07 NOTE — Therapy (Signed)
[facility] [address] [place], [name], [address] Phone: [phone]   Fax:  [phone]  Physical Therapy Treatment  Patient Details  Name: Timothy Davenport MRN: [id number] Date of Birth: May 21, 1950 Referring Provider (PT): Juel Burrow    Encounter Date: 10/07/2019   PT End of Session - 10/07/19 0939    Visit Number 4    Number of Visits 16    Date for PT Re-Evaluation 01/26/20   15 visits in 120 days   Authorization Type VA    Authorization - Visit Number 3    Authorization - Number of Visits 15    Progress Note Due on Visit 10    PT Start Time 0935    PT Stop Time 1017    PT Time Calculation (min) 42 min    Equipment Utilized During Treatment Gait belt    Activity Tolerance Patient tolerated treatment well    Behavior During Therapy [facility] for tasks assessed/performed           Past Medical History:  Diagnosis Date  . Colon polyp   . Diabetes mellitus without complication ([place])   . Diabetic neuropathy ([place])   . HTN (hypertension)   . Patella fracture   . Renal calculi     Past Surgical History:  Procedure Laterality Date  . LUMBAR LAMINECTOMY/DECOMPRESSION MICRODISCECTOMY N/A 07/01/2019   Procedure: LUMBAR LAMINECTOMY/DECOMPRESSION MICRODISCECTOMY 1 LEVEL, THORACIC SIX-SEVEN;  Surgeon: Consuella Lose, MD;  Location: [facility];  Service: Neurosurgery;  Laterality: N/A;  LUMBAR LAMINECTOMY/DECOMPRESSION MICRODISCECTOMY 1 LEVEL, THORACIC SIX-SEVEN   . ORIF PATELLA    . THORACIC DISCECTOMY N/A 07/03/2019   Procedure: Evacuation of Thoracic Hematoma;  Surgeon: Consuella Lose, MD;  Location: [place];  Service: Neurosurgery;  Laterality: N/A;    There were no vitals filed for this visit.   Subjective Assessment - 10/07/19 0938    Subjective No new complaints. No falls or pain to report. Has new shoes and right AFO from [employer]. Left shoe has a built up sole.    Pertinent History T8 paraplegia ASIA B,  diabetes, HTN    How long can you stand comfortably? reports he can stand for 30 seconds with a RW (with HHPT)    Patient Stated Goals wants to get out of here walking.    Currently in Pain? No/denies                 [facility] Adult PT Treatment/Exercise - 10/07/19 0940      Transfers   Transfers Lateral/Scoot Transfers;Sit to Stand;Stand to Sit    Sit to Stand 3: Mod assist    Stand to Sit 3: Mod assist    Lateral/Scoot Transfers 5: Supervision;With armrests removed;With slide board    Lateral/Scoot Transfer Details (indicate cue type and reason) wheelchair<>mat table with pt self placing the slide board.     Comments use of Steady lift assist for standing to look at brace stability and pelvic height to check that shoe build on left side has equalize pt's leg length. Brace appears to stabilize right LE and pelvis apprears level in standing.                    Exercises   Exercises Other Exercises    Other Exercises  concurrent with Bioness to bil LE's: with bil feet on rocker board in ant/post direction had pt work on AA DF with on time, rest with off times; then with feet on the floor  had pt use UE's on chair to "hover" hips over mat with on time, rest with off time. mod assist needed with minimal clearance noted for ~4 reps, then transitioned to UE's on mat table to lift buttocks up with on times, rest with off times for 6-7 more reps.       Modalities   Modalities Teacher, English as a foreign language Location bil anterior tib and quads    Electrical Stimulation Action for increased muscle activation and strengthening    Electrical Stimulation Parameters refer to tablet 1 for adjusted parameters; quick fit electrodes    Electrical Stimulation Goals Strength;Neuromuscular facilitation                 PT Short Term Goals - 09/20/19 2130      PT SHORT TERM GOAL #1   Title Pt and spouse will be independent with initial HEP for BLE  stretching and strengthening. ALL STGS DUE 10/25/19    Time 5   due to delay in scheduling   Period Weeks    Status New    Target Date 10/25/19      PT SHORT TERM GOAL #2   Title Pt will perform lateral scoot transfer with min A for use of set up of slide board in order to decr caregiver burden.    Baseline pt requiring total A for set up of slide board.    Time 5    Period Weeks    Status New      PT SHORT TERM GOAL #3   Title Pt will perform standing in [place] with min/mod A for at least 60 seconds for functional weight bearing.    Time 5    Period Weeks    Status New      PT SHORT TERM GOAL #4   Title Pt will verbalize understanding of performing pressure relief in order to decr risk of pressure sores.    Time 5    Period Weeks    Status New      PT SHORT TERM GOAL #5   Title Pt will perform all bed mobility with min guard/supervision in order to decr caregiver burden.    Time 5    Period Weeks    Status New             PT Long Term Goals - 09/20/19 2138      PT LONG TERM GOAL #1   Title Pt and spouse will be independent with final HEP for BLE stretching and strengthening. ALL LTGS DUE 11/22/19    Time 9    Period Weeks    Status New    Target Date 11/22/19      PT LONG TERM GOAL #2   Title Pt will perform all bed mobility with mod I in order to decr caregiver burden.    Time 9    Period Weeks    Status New      PT LONG TERM GOAL #3   Title Pt will perform squat pivot transfer from w/c <> mat table with supervision in order to demo improved functional mobility.    Time 9    Period Weeks    Status New      PT LONG TERM GOAL #4   Title Pt will perform sit <> stand with RW with mod A in order to demo improved functional mobility.    Time 9    Period Weeks  Status New                 Plan - 10/07/19 0939    Clinical Impression Statement Today's skilled session initially focused on check out of pt's new AFO and built up shoe with pt noted to have  more equal leg length in standing. Remainder of session focused on set up of Bioness to bil LE"s with use during ex's. No issues noted or reported in session. The pt is progressing toward goals and should benefit from continued PT to progress toward [place] goals    Personal Factors and Comorbidities Comorbidity 3+;Past/Current Experience    Comorbidities T8 paraplegia, diabetes, HTN    Examination-Activity Limitations Bed Mobility;Locomotion Level;Transfers;Stand    Examination-Participation Restrictions Community Activity;Yard Work    Merchant navy officer Evolving/Moderate complexity    PT Frequency 2x / week    PT Duration --   7 weeks   PT Treatment/Interventions Aquatic Therapy;Manual techniques;Therapeutic exercise;Gait training;Neuromuscular re-education;Orthotic Fit/Training    PT Next Visit Plan continue with Bioness to bil LE's with ex's, standing, ? pre-gait activities; continue to work on core strengthening- quadruped, tall kneeling    PT Home Exercise Plan Access Code: [email or web address]    Consulted and Agree with Plan of Care Patient;Family member/caregiver    Family Member Consulted wife, [name]           Patient will benefit from skilled therapeutic intervention in order to improve the following deficits and impairments:  Decreased balance, Decreased coordination, Decreased range of motion, Decreased strength, Decreased mobility, Impaired sensation, Postural dysfunction, Impaired tone, Decreased activity tolerance  Visit Diagnosis: Muscle weakness (generalized)  Other symptoms and signs involving the nervous system  Abnormal posture     Problem List Patient Active Problem List   Diagnosis Date Noted  . Spasticity 08/03/2019  . Paraplegia ([place]) 07/07/2019  . Neurogenic bowel 07/07/2019  . Neurogenic bladder 07/07/2019  . 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

## 2019-10-07 NOTE — Telephone Encounter (Signed)
Oral swab drug screen was consistent for prescribed medication tramadol, but is also positive for marijuana and its metabolite.

## 2019-10-09 ENCOUNTER — Ambulatory Visit: Payer: No Typology Code available for payment source | Admitting: Physical Therapy

## 2019-10-09 ENCOUNTER — Other Ambulatory Visit: Payer: Self-pay

## 2019-10-09 DIAGNOSIS — M6281 Muscle weakness (generalized): Secondary | ICD-10-CM

## 2019-10-09 DIAGNOSIS — G8222 Paraplegia, incomplete: Secondary | ICD-10-CM

## 2019-10-09 DIAGNOSIS — R29818 Other symptoms and signs involving the nervous system: Secondary | ICD-10-CM

## 2019-10-09 DIAGNOSIS — R293 Abnormal posture: Secondary | ICD-10-CM

## 2019-10-09 NOTE — Therapy (Signed)
[facility] [address] [place], [name], [address] Phone: [phone]   Fax:  [phone]  Physical Therapy Treatment  Patient Details  Name: Timothy Davenport MRN: [id number] Date of Birth: [birth year] Referring Provider (PT): Juel Burrow    Encounter Date: 10/09/2019   PT End of Session - 10/09/19 1223    Visit Number 5    Number of Visits 16    Date for PT Re-Evaluation 01/26/20   15 visits in 120 days   Authorization Type VA    Authorization - Visit Number 4    Authorization - Number of Visits 15    Progress Note Due on Visit 10    PT Start Time 1017    PT Stop Time 1058    PT Time Calculation (min) 41 min    Equipment Utilized During Treatment Gait belt    Activity Tolerance Patient tolerated treatment well    Behavior During Therapy WFL for tasks assessed/performed           Past Medical History:  Diagnosis Date  . Colon polyp   . Diabetes mellitus without complication ([place])   . Diabetic neuropathy ([place])   . HTN (hypertension)   . Patella fracture   . Renal calculi     Past Surgical History:  Procedure Laterality Date  . LUMBAR LAMINECTOMY/DECOMPRESSION MICRODISCECTOMY N/A 07/01/2019   Procedure: LUMBAR LAMINECTOMY/DECOMPRESSION MICRODISCECTOMY 1 LEVEL, THORACIC SIX-SEVEN;  Surgeon: Consuella Lose, MD;  Location: [facility];  Service: Neurosurgery;  Laterality: N/A;  LUMBAR LAMINECTOMY/DECOMPRESSION MICRODISCECTOMY 1 LEVEL, THORACIC SIX-SEVEN   . ORIF PATELLA    . THORACIC DISCECTOMY N/A 07/03/2019   Procedure: Evacuation of Thoracic Hematoma;  Surgeon: Consuella Lose, MD;  Location: [facility];  Service: Neurosurgery;  Laterality: N/A;    There were no vitals filed for this visit.                      [facility] Adult PT Treatment/Exercise - 10/09/19 1226      Transfers   Sit to Stand 3: Mod assist   x2 for safety   Sit to Stand Details (indicate cue type and reason) performed  from elevated mat table - with use of Stedy and then with RW    Stand to Sit 3: Mod assist    Lateral/Scoot Transfers 5: Supervision    Lateral/Scoot Transfer Details (indicate cue type and reason) wheelchair <> mat table, needing one instance of therapist helping to assist with sliding board placement      Neuro Re-ed    Neuro Re-ed Details  Neuro-red, concurrent with Bioness to bil LE's: then with feet on the floor had pt use UE's on mat to "hover" hips over mat with on time, rest with off time x10 reps with on time of 8 seconds off time of 8. performed partial sit <> stands in [place] with mod A and tactile and verbal cues for full quad activation x10 reps with 8 second on during standing and 8 second off when resting. with use of RW, PT tech and PT providing mod A from elevated mat table and blocking B knees and performing verbal/tactile cues to stand erect/knee extension, performed sit <> stands x6 reps with on times of 10 seconds and off times of 15 seconds       Modalities   Modalities Electrical Stimulation      Electrical Stimulation   Electrical Stimulation Location bil anterior tib and quads    Electrical Stimulation  Action for incr muscle activation, sensation, and strengthening    Electrical Stimulation Parameters refer to tablet 1 for parameters, quick fit electrode    Electrical Stimulation Goals Strength;Neuromuscular facilitation                    PT Short Term Goals - 09/20/19 2130      PT SHORT TERM GOAL #1   Title Pt and spouse will be independent with initial HEP for BLE stretching and strengthening. ALL STGS DUE 10/25/19    Time 5   due to delay in scheduling   Period Weeks    Status New    Target Date 10/25/19      PT SHORT TERM GOAL #2   Title Pt will perform lateral scoot transfer with min A for use of set up of slide board in order to decr caregiver burden.    Baseline pt requiring total A for set up of slide board.    Time 5    Period Weeks    Status  New      PT SHORT TERM GOAL #3   Title Pt will perform standing in [place] with min/mod A for at least 60 seconds for functional weight bearing.    Time 5    Period Weeks    Status New      PT SHORT TERM GOAL #4   Title Pt will verbalize understanding of performing pressure relief in order to decr risk of pressure sores.    Time 5    Period Weeks    Status New      PT SHORT TERM GOAL #5   Title Pt will perform all bed mobility with min guard/supervision in order to decr caregiver burden.    Time 5    Period Weeks    Status New             PT Long Term Goals - 09/20/19 2138      PT LONG TERM GOAL #1   Title Pt and spouse will be independent with final HEP for BLE stretching and strengthening. ALL LTGS DUE 11/22/19    Time 9    Period Weeks    Status New    Target Date 11/22/19      PT LONG TERM GOAL #2   Title Pt will perform all bed mobility with mod I in order to decr caregiver burden.    Time 9    Period Weeks    Status New      PT LONG TERM GOAL #3   Title Pt will perform squat pivot transfer from w/c <> mat table with supervision in order to demo improved functional mobility.    Time 9    Period Weeks    Status New      PT LONG TERM GOAL #4   Title Pt will perform sit <> stand with RW with mod A in order to demo improved functional mobility.    Time 9    Period Weeks    Status New                 Plan - 10/09/19 1224    Clinical Impression Statement Today's skilled session focused on use of Bioness to B ant tibs and quads to use during exercise and functional BLE strengthening. With 2nd person assist of PT tech, able to stand in RW with Bioness and PT and PT tech blocking pt's knees and for quad activation. Pt tolerated session well,  needing intermittent rest breaks due to fatigue. Will continue to progress towards LTGs.    Personal Factors and Comorbidities Comorbidity 3+;Past/Current Experience    Comorbidities T8 paraplegia, diabetes, HTN     Examination-Activity Limitations Bed Mobility;Locomotion Level;Transfers;Stand    Examination-Participation Restrictions Community Activity;Yard Work    Merchant navy officer Evolving/Moderate complexity    PT Frequency 2x / week    PT Duration --   7 weeks   PT Treatment/Interventions Aquatic Therapy;Manual techniques;Therapeutic exercise;Gait training;Neuromuscular re-education;Orthotic Fit/Training    PT Next Visit Plan continue with Bioness to bil LE's with ex's, standing, ? pre-gait activities; continue to work on core strengthening- quadruped, tall kneeling    PT Home Exercise Plan Access Code: [email or web address]    Consulted and Agree with Plan of Care Patient;Family member/caregiver    Family Member Consulted wife, [name]           Patient will benefit from skilled therapeutic intervention in order to improve the following deficits and impairments:  Decreased balance, Decreased coordination, Decreased range of motion, Decreased strength, Decreased mobility, Impaired sensation, Postural dysfunction, Impaired tone, Decreased activity tolerance  Visit Diagnosis: Muscle weakness (generalized)  Other symptoms and signs involving the nervous system  Abnormal posture  Paraplegia, incomplete ([place])     Problem List Patient Active Problem List   Diagnosis Date Noted  . Spasticity 08/03/2019  . Paraplegia ([place]) 07/07/2019  . Neurogenic bowel 07/07/2019  . Neurogenic bladder 07/07/2019  . 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

## 2019-10-13 ENCOUNTER — Encounter: Payer: Self-pay | Admitting: Physical Therapy

## 2019-10-13 ENCOUNTER — Other Ambulatory Visit: Payer: Self-pay

## 2019-10-13 ENCOUNTER — Ambulatory Visit: Payer: No Typology Code available for payment source | Admitting: Physical Therapy

## 2019-10-13 DIAGNOSIS — R29818 Other symptoms and signs involving the nervous system: Secondary | ICD-10-CM

## 2019-10-13 DIAGNOSIS — R293 Abnormal posture: Secondary | ICD-10-CM

## 2019-10-13 DIAGNOSIS — G8222 Paraplegia, incomplete: Secondary | ICD-10-CM

## 2019-10-13 DIAGNOSIS — M6281 Muscle weakness (generalized): Secondary | ICD-10-CM | POA: Diagnosis not present

## 2019-10-13 NOTE — Therapy (Signed)
[facility] [address] [place], [name], [address] Phone: [phone]   Fax:  [phone]  Physical Therapy Treatment  Patient Details  Name: Timothy Davenport MRN: [id number] Date of Birth: 04-14-1950 Referring Provider (PT): Juel Burrow    Encounter Date: 10/13/2019   PT End of Session - 10/13/19 1428    Visit Number 6    Number of Visits 16    Date for PT Re-Evaluation 01/26/20   15 visits in 120 days   Authorization Type VA    Authorization - Visit Number 5    Authorization - Number of Visits 15    Progress Note Due on Visit 10    PT Start Time 1016    PT Stop Time 1058    PT Time Calculation (min) 42 min    Equipment Utilized During Treatment Gait belt    Activity Tolerance Patient tolerated treatment well    Behavior During Therapy WFL for tasks assessed/performed           Past Medical History:  Diagnosis Date   Colon polyp    Diabetes mellitus without complication ([place])    Diabetic neuropathy ([place])    HTN (hypertension)    Patella fracture    Renal calculi     Past Surgical History:  Procedure Laterality Date   LUMBAR LAMINECTOMY/DECOMPRESSION MICRODISCECTOMY N/A 07/01/2019   Procedure: LUMBAR LAMINECTOMY/DECOMPRESSION MICRODISCECTOMY 1 LEVEL, THORACIC SIX-SEVEN;  Surgeon: Consuella Lose, MD;  Location: [facility];  Service: Neurosurgery;  Laterality: N/A;  LUMBAR LAMINECTOMY/DECOMPRESSION MICRODISCECTOMY 1 LEVEL, THORACIC SIX-SEVEN    ORIF PATELLA     THORACIC DISCECTOMY N/A 07/03/2019   Procedure: Evacuation of Thoracic Hematoma;  Surgeon: Consuella Lose, MD;  Location: [facility];  Service: Neurosurgery;  Laterality: N/A;    There were no vitals filed for this visit.   Subjective Assessment - 10/13/19 1018    Subjective Went to the [place] yesterday. Being assessed for a handicapped Lucianne Lei.    Pertinent History T8 paraplegia ASIA B, diabetes, HTN    How long can you stand comfortably?  reports he can stand for 30 seconds with a RW (with HHPT)    Patient Stated Goals wants to get out of here walking.    Currently in Pain? No/denies                             [facility] Adult PT Treatment/Exercise - 10/13/19 0001      Transfers   Lateral/Scoot Transfers 5: Supervision    Lateral/Scoot Transfer Details (indicate cue type and reason) wheelchair <> mat table with pt able to place sliding board      Neuro Re-ed    Neuro Re-ed Details  concurrent with Bioness to bil LE's (ant tib and quads): with bil feet on rocker board in ant/post direction had pt work on [employer] DF with on time, rest with off times x10 reps; with BLE over bolster with pt supine on mat table, 2 x 10 reps SAQs with AA from therapist for full ROM (alternating between legs), performed partial sit <> stands in [place] with min A and tactile and verbal cues for full quad activation x10 reps with 8 second on during standing and 10 second off when resting.       Acupuncturist Location bil anterior tib and Designer, television/film set Action for incr muscle activation, sensation, and Glass blower/designer  Stimulation Parameters refer to tablet 1 for adjusted parameters; quick fit electrodes    Electrical Stimulation Goals Strength;Neuromuscular facilitation                    PT Short Term Goals - 09/20/19 2130      PT SHORT TERM GOAL #1   Title Pt and spouse will be independent with initial HEP for BLE stretching and strengthening. ALL STGS DUE 10/25/19    Time 5   due to delay in scheduling   Period Weeks    Status New    Target Date 10/25/19      PT SHORT TERM GOAL #2   Title Pt will perform lateral scoot transfer with min A for use of set up of slide board in order to decr caregiver burden.    Baseline pt requiring total A for set up of slide board.    Time 5    Period Weeks    Status New      PT SHORT TERM GOAL #3   Title Pt will perform  standing in [place] with min/mod A for at least 60 seconds for functional weight bearing.    Time 5    Period Weeks    Status New      PT SHORT TERM GOAL #4   Title Pt will verbalize understanding of performing pressure relief in order to decr risk of pressure sores.    Time 5    Period Weeks    Status New      PT SHORT TERM GOAL #5   Title Pt will perform all bed mobility with min guard/supervision in order to decr caregiver burden.    Time 5    Period Weeks    Status New             PT Long Term Goals - 09/20/19 2138      PT LONG TERM GOAL #1   Title Pt and spouse will be independent with final HEP for BLE stretching and strengthening. ALL LTGS DUE 11/22/19    Time 9    Period Weeks    Status New    Target Date 11/22/19      PT LONG TERM GOAL #2   Title Pt will perform all bed mobility with mod I in order to decr caregiver burden.    Time 9    Period Weeks    Status New      PT LONG TERM GOAL #3   Title Pt will perform squat pivot transfer from w/c <> mat table with supervision in order to demo improved functional mobility.    Time 9    Period Weeks    Status New      PT LONG TERM GOAL #4   Title Pt will perform sit <> stand with RW with mod A in order to demo improved functional mobility.    Time 9    Period Weeks    Status New                 Plan - 10/13/19 1429    Clinical Impression Statement Today's skilled session continued to focus on use of Bioness to B ant tibs and quads for strengthening and NMR. Pt tolerated session well with no issues noted. Pt with decr R quad activation compared to L when performing SAQs. Will continue to progress towards LTGs.    Personal Factors and Comorbidities Comorbidity 3+;Past/Current Experience    Comorbidities T8 paraplegia, diabetes,  HTN    Examination-Activity Limitations Bed Mobility;Locomotion Level;Transfers;Stand    Examination-Participation Restrictions Community Activity;Yard Work    Copy Evolving/Moderate complexity    PT Frequency 2x / week    PT Duration --   7 weeks   PT Treatment/Interventions Aquatic Therapy;Manual techniques;Therapeutic exercise;Gait training;Neuromuscular re-education;Orthotic Fit/Training    PT Next Visit Plan continue with Bioness to bil LE's with ex's, standing, pre-gait activities; continue to work on core strengthening- quadruped, tall kneeling when appropriate    PT Home Exercise Plan Access Code: [email or web address]    Consulted and Agree with Plan of Care Patient;Family member/caregiver    Family Member Consulted wife, [name]           Patient will benefit from skilled therapeutic intervention in order to improve the following deficits and impairments:  Decreased balance, Decreased coordination, Decreased range of motion, Decreased strength, Decreased mobility, Impaired sensation, Postural dysfunction, Impaired tone, Decreased activity tolerance  Visit Diagnosis: Muscle weakness (generalized)  Other symptoms and signs involving the nervous system  Abnormal posture  Paraplegia, incomplete [facility])     Problem List Patient Active Problem List   Diagnosis Date Noted   Spasticity 08/03/2019   Paraplegia ([place]) 07/07/2019   Neurogenic bowel 07/07/2019   Neurogenic bladder 07/07/2019   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 [place], [name], [address] Phone: [phone]   Fax:  [phone]  Name: Timothy Davenport MRN: [id number] Date of Birth: 06-29-1950

## 2019-10-15 ENCOUNTER — Ambulatory Visit: Payer: No Typology Code available for payment source | Admitting: Physical Therapy

## 2019-10-15 ENCOUNTER — Other Ambulatory Visit: Payer: Self-pay

## 2019-10-15 ENCOUNTER — Encounter: Payer: Self-pay | Admitting: Physical Therapy

## 2019-10-15 DIAGNOSIS — M6281 Muscle weakness (generalized): Secondary | ICD-10-CM | POA: Diagnosis not present

## 2019-10-15 DIAGNOSIS — R29818 Other symptoms and signs involving the nervous system: Secondary | ICD-10-CM

## 2019-10-15 DIAGNOSIS — R293 Abnormal posture: Secondary | ICD-10-CM

## 2019-10-16 NOTE — Therapy (Signed)
[facility] [address] [place], [name], [address] Phone: [phone]   Fax:  [phone]  Physical Therapy Treatment  Patient Details  Name: Timothy Davenport MRN: [id number] Date of Birth: Nov 05, 1950 Referring Provider (PT): Juel Burrow    Encounter Date: 10/15/2019   PT End of Session - 10/15/19 0933    Visit Number 7    Number of Visits 16    Date for PT Re-Evaluation 01/26/20   15 visits in 120 days   Authorization Type VA    Authorization - Visit Number 6    Authorization - Number of Visits 15    Progress Note Due on Visit 10    PT Start Time 0931    PT Stop Time 1014    PT Time Calculation (min) 43 min    Equipment Utilized During Treatment Gait belt    Activity Tolerance Patient tolerated treatment well    Behavior During Therapy [facility] for tasks assessed/performed           Past Medical History:  Diagnosis Date  . Colon polyp   . Diabetes mellitus without complication ([place])   . Diabetic neuropathy ([place])   . HTN (hypertension)   . Patella fracture   . Renal calculi     Past Surgical History:  Procedure Laterality Date  . LUMBAR LAMINECTOMY/DECOMPRESSION MICRODISCECTOMY N/A 07/01/2019   Procedure: LUMBAR LAMINECTOMY/DECOMPRESSION MICRODISCECTOMY 1 LEVEL, THORACIC SIX-SEVEN;  Surgeon: Consuella Lose, MD;  Location: [facility];  Service: Neurosurgery;  Laterality: N/A;  LUMBAR LAMINECTOMY/DECOMPRESSION MICRODISCECTOMY 1 LEVEL, THORACIC SIX-SEVEN   . ORIF PATELLA    . THORACIC DISCECTOMY N/A 07/03/2019   Procedure: Evacuation of Thoracic Hematoma;  Surgeon: Consuella Lose, MD;  Location: [facility];  Service: Neurosurgery;  Laterality: N/A;    There were no vitals filed for this visit.   Subjective Assessment - 10/15/19 0932    Subjective No new complaints. No falls or pain to report. Had his first driving lessons with hand controls, "it went well".    Patient is accompained by: Family member     Pertinent History T8 paraplegia ASIA B, diabetes, HTN    How long can you stand comfortably? reports he can stand for 30 seconds with a RW (with HHPT)    Patient Stated Goals wants to get out of here walking.    Currently in Pain? No/denies                [facility] Adult PT Treatment/Exercise - 10/15/19 0934      Transfers   Transfers Lateral/Scoot Transfers;Sit to Stand;Stand to Sit    Sit to Stand 3: Mod assist;4: Min guard    Stand to Sit 3: Mod assist;4: Min guard    Lateral/Scoot Transfers 5: Supervision    Lateral/Scoot Transfer Details (indicate cue type and reason) wheelchair<>mat table with pt self managing slide board.       Neuro Re-ed    Neuro Re-ed Details  for strengthening/muscle re-ed concurrent with Bioness to bil LE's: with use of Stedy Lift assit- standing with on time of 10 sec's, resting with off times in bloc practice. min to mod assist to stand in [place] with overpressure at knee to achieve terminal extension. cue to use arms for full upright posture with assist at pelvis as well.        Exercises   Exercises Other Exercises    Other Exercises  concurrent with Bioness to bil LE's:  seated at edge of mat- with bil  feet on rockerboard AA DF wtih on time, rest with off time x 20 reps. then with arms on mat next to hips- "push up" to hover hips off mat with on time of 8 sec's, rest with off times x 10 reps.       Acupuncturist Location bil anterior tib and Designer, television/film set Action for increased muscle activation    Electrical Stimulation Parameters refer to tablet 1 for adjusted parameters; quick fit electrodes    Electrical Stimulation Goals Strength;Neuromuscular facilitation                    PT Short Term Goals - 09/20/19 2130      PT SHORT TERM GOAL #1   Title Pt and spouse will be independent with initial HEP for BLE stretching and strengthening. ALL STGS DUE 10/25/19    Time 5   due to delay in  scheduling   Period Weeks    Status New    Target Date 10/25/19      PT SHORT TERM GOAL #2   Title Pt will perform lateral scoot transfer with min A for use of set up of slide board in order to decr caregiver burden.    Baseline pt requiring total A for set up of slide board.    Time 5    Period Weeks    Status New      PT SHORT TERM GOAL #3   Title Pt will perform standing in [place] with min/mod A for at least 60 seconds for functional weight bearing.    Time 5    Period Weeks    Status New      PT SHORT TERM GOAL #4   Title Pt will verbalize understanding of performing pressure relief in order to decr risk of pressure sores.    Time 5    Period Weeks    Status New      PT SHORT TERM GOAL #5   Title Pt will perform all bed mobility with min guard/supervision in order to decr caregiver burden.    Time 5    Period Weeks    Status New             PT Long Term Goals - 09/20/19 2138      PT LONG TERM GOAL #1   Title Pt and spouse will be independent with final HEP for BLE stretching and strengthening. ALL LTGS DUE 11/22/19    Time 9    Period Weeks    Status New    Target Date 11/22/19      PT LONG TERM GOAL #2   Title Pt will perform all bed mobility with mod I in order to decr caregiver burden.    Time 9    Period Weeks    Status New      PT LONG TERM GOAL #3   Title Pt will perform squat pivot transfer from w/c <> mat table with supervision in order to demo improved functional mobility.    Time 9    Period Weeks    Status New      PT LONG TERM GOAL #4   Title Pt will perform sit <> stand with RW with mod A in order to demo improved functional mobility.    Time 9    Period Weeks    Status New  Plan - 10/15/19 0934    Clinical Impression Statement Today's skilled session continued to focus on use of Bioness to Bil LE's with ex's/transfers for strengthening and NMR. No issues noted or reported in session. The pt is progressing toward  goals and should benefit from continued PT to progress toward unmet goals.    Personal Factors and Comorbidities Comorbidity 3+;Past/Current Experience    Comorbidities T8 paraplegia, diabetes, HTN    Examination-Activity Limitations Bed Mobility;Locomotion Level;Transfers;Stand    Examination-Participation Restrictions Community Activity;Yard Work    Merchant navy officer Evolving/Moderate complexity    PT Frequency 2x / week    PT Duration --   7 weeks   PT Treatment/Interventions Aquatic Therapy;Manual techniques;Therapeutic exercise;Gait training;Neuromuscular re-education;Orthotic Fit/Training    PT Next Visit Plan continue with Bioness to bil LE's with ex's, standing, pre-gait activities; continue to work on core strengthening- quadruped, tall kneeling when appropriate    PT Home Exercise Plan Access Code: [email or web address]    Consulted and Agree with Plan of Care Patient;Family member/caregiver    Family Member Consulted wife, [name]           Patient will benefit from skilled therapeutic intervention in order to improve the following deficits and impairments:  Decreased balance, Decreased coordination, Decreased range of motion, Decreased strength, Decreased mobility, Impaired sensation, Postural dysfunction, Impaired tone, Decreased activity tolerance  Visit Diagnosis: Muscle weakness (generalized)  Other symptoms and signs involving the nervous system  Abnormal posture     Problem List Patient Active Problem List   Diagnosis Date Noted  . Spasticity 08/03/2019  . Paraplegia ([place]) 07/07/2019  . Neurogenic bowel 07/07/2019  . Neurogenic bladder 07/07/2019  . 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

## 2019-10-20 ENCOUNTER — Ambulatory Visit: Payer: No Typology Code available for payment source | Admitting: Physical Therapy

## 2019-10-20 ENCOUNTER — Other Ambulatory Visit: Payer: Self-pay

## 2019-10-20 DIAGNOSIS — M6281 Muscle weakness (generalized): Secondary | ICD-10-CM | POA: Diagnosis not present

## 2019-10-20 DIAGNOSIS — R29818 Other symptoms and signs involving the nervous system: Secondary | ICD-10-CM

## 2019-10-20 DIAGNOSIS — R293 Abnormal posture: Secondary | ICD-10-CM

## 2019-10-20 DIAGNOSIS — G8222 Paraplegia, incomplete: Secondary | ICD-10-CM

## 2019-10-20 NOTE — Therapy (Signed)
[facility] [address] [place], [name], [address] Phone: [phone]   Fax:  [phone]  Physical Therapy Treatment  Patient Details  Name: Timothy Davenport MRN: [id number] Date of Birth: 1950/07/21 Referring Provider (PT): Juel Burrow    Encounter Date: 10/20/2019   PT End of Session - 10/20/19 1310    Visit Number 8    Number of Visits 16    Date for PT Re-Evaluation 01/26/20   15 visits in 120 days   Authorization Type [facility]    Authorization - Visit Number 7    Authorization - Number of Visits 15    Progress Note Due on Visit 10    PT Start Time 1100    PT Stop Time 1145    PT Time Calculation (min) 45 min    Equipment Utilized During Treatment Gait belt    Activity Tolerance Patient tolerated treatment well    Behavior During Therapy [facility] for tasks assessed/performed           Past Medical History:  Diagnosis Date  . Colon polyp   . Diabetes mellitus without complication ([place])   . Diabetic neuropathy ([place])   . HTN (hypertension)   . Patella fracture   . Renal calculi     Past Surgical History:  Procedure Laterality Date  . LUMBAR LAMINECTOMY/DECOMPRESSION MICRODISCECTOMY N/A 07/01/2019   Procedure: LUMBAR LAMINECTOMY/DECOMPRESSION MICRODISCECTOMY 1 LEVEL, THORACIC SIX-SEVEN;  Surgeon: Consuella Lose, MD;  Location: [facility];  Service: Neurosurgery;  Laterality: N/A;  LUMBAR LAMINECTOMY/DECOMPRESSION MICRODISCECTOMY 1 LEVEL, THORACIC SIX-SEVEN   . ORIF PATELLA    . THORACIC DISCECTOMY N/A 07/03/2019   Procedure: Evacuation of Thoracic Hematoma;  Surgeon: Consuella Lose, MD;  Location: [facility];  Service: Neurosurgery;  Laterality: N/A;    There were no vitals filed for this visit.   Subjective Assessment - 10/20/19 1103    Subjective Feels like he is getting stronger from the Bioness. No falls.    Patient is accompained by: Family member    Pertinent History T8 paraplegia ASIA B, diabetes, HTN     How long can you stand comfortably? reports he can stand for 30 seconds with a RW (with HHPT)    Patient Stated Goals wants to get out of here walking.    Currently in Pain? No/denies                             [facility] Adult PT Treatment/Exercise - 10/20/19 0001      Transfers   Sit to Stand 4: Min assist    Sit to Stand Details (indicate cue type and reason) performed from elevated mat table with use of [place]    Stand to Sit 4: Min assist    Stand to Sit Details with Charlaine Dalton    Lateral/Scoot Transfers 5: Supervision    Lateral/Scoot Transfer Details (indicate cue type and reason) w/c <> mat table with use of sliding board    Comments standing in [place] with BUE support, 2 bouts of 30 seconds, min guard, pt performing with incr B knee flexion due to incr fatigue at end of session      Neuro Re-ed    Neuro Re-ed Details  with Bioness to B ant tibs and quads, with on time of 8 seconds and off time of 10 seconds, partial sit <> stands from [place] with BUE support x10 reps, had prolonged rest break and then performed an additional  x5 reps, assist from therapist for B terminal knee extension (pt with incr difficulty with this during 2nd rep)      Electrical Stimulation   Electrical Stimulation Location bil anterior tib and quads    Electrical Stimulation Action for incr muscle activation    Electrical Stimulation Parameters refer to tablet 1 for adjusted parameters, quick fit electrodes    Electrical Stimulation Goals Strength;Neuromuscular facilitation                  PT Education - 10/20/19 1312    Education Details reviewed pressure relief to decr risk of sores, pt reports performing every 1-2 hrs, discussed performing every 20-30 minutes    Person(s) Educated Patient;Spouse    Methods Explanation    Comprehension Verbalized understanding            PT Short Term Goals - 10/20/19 1134      PT SHORT TERM GOAL #1   Title Pt and spouse will be independent  with initial HEP for BLE stretching and strengthening. ALL STGS DUE 10/25/19    Time 5   due to delay in scheduling   Period Weeks    Status New    Target Date 10/25/19      PT SHORT TERM GOAL #2   Title Pt will perform lateral scoot transfer with min A for use of set up of slide board in order to decr caregiver burden.    Baseline performs with supervision    Time 5    Period Weeks    Status Achieved      PT SHORT TERM GOAL #3   Title Pt will perform standing in [place] with min/mod A for at least 60 seconds for functional weight bearing.    Baseline 30 seconds on 10/20/19 with min guard    Time 5    Period Weeks    Status On-going      PT SHORT TERM GOAL #4   Title Pt will verbalize understanding of performing pressure relief in order to decr risk of pressure sores.    Baseline pt reports performing pressure relief every 1-2 hrs, verbalizes understanding of performing every 20-30 minutes    Time 5    Period Weeks    Status Partially Met      PT SHORT TERM GOAL #5   Title Pt will perform all bed mobility with min guard/supervision in order to decr caregiver burden.    Time 5    Period Weeks    Status New             PT Long Term Goals - 09/20/19 2138      PT LONG TERM GOAL #1   Title Pt and spouse will be independent with final HEP for BLE stretching and strengthening. ALL LTGS DUE 11/22/19    Time 9    Period Weeks    Status New    Target Date 11/22/19      PT LONG TERM GOAL #2   Title Pt will perform all bed mobility with mod I in order to decr caregiver burden.    Time 9    Period Weeks    Status New      PT LONG TERM GOAL #3   Title Pt will perform squat pivot transfer from w/c <> mat table with supervision in order to demo improved functional mobility.    Time 9    Period Weeks    Status New      PT  LONG TERM GOAL #4   Title Pt will perform sit <> stand with RW with mod A in order to demo improved functional mobility.    Time 9    Period Weeks    Status  New                 Plan - 10/20/19 1312    Clinical Impression Statement Today's skilled session continued to focus on use of Bioness to BLE for strengthenig for partial sit <>stands in [place]. Pt fatigued easily with sit to stands, therapist assisting with pt achieving terminal knee extension. Without use of Bioness pt able to stand for 30 seconds (performed at end of session when pt was fatigued). Pt met LTG #2, able to consistently perform sliding board transfers with supervision from mat <> wheelchair. Will continue to progress towards LTGs.    Personal Factors and Comorbidities Comorbidity 3+;Past/Current Experience    Comorbidities T8 paraplegia, diabetes, HTN    Examination-Activity Limitations Bed Mobility;Locomotion Level;Transfers;Stand    Examination-Participation Restrictions Community Activity;Yard Work    Merchant navy officer Evolving/Moderate complexity    PT Frequency 2x / week    PT Duration --   7 weeks   PT Treatment/Interventions Aquatic Therapy;Manual techniques;Therapeutic exercise;Gait training;Neuromuscular re-education;Orthotic Fit/Training    PT Next Visit Plan check remaining of STGs. continue with Bioness to bil LE's with ex's, standing, pre-gait activities; continue to work on core strengthening- quadruped, tall kneeling when appropriate    PT Home Exercise Plan Access Code: [email or web address]    Consulted and Agree with Plan of Care Patient;Family member/caregiver    Family Member Consulted wife, [name]           Patient will benefit from skilled therapeutic intervention in order to improve the following deficits and impairments:  Decreased balance, Decreased coordination, Decreased range of motion, Decreased strength, Decreased mobility, Impaired sensation, Postural dysfunction, Impaired tone, Decreased activity tolerance  Visit Diagnosis: Muscle weakness (generalized)  Other symptoms and signs involving the nervous system  Abnormal  posture  Paraplegia, incomplete ([place])     Problem List Patient Active Problem List   Diagnosis Date Noted  . Spasticity 08/03/2019  . Paraplegia ([place]) 07/07/2019  . Neurogenic bowel 07/07/2019  . Neurogenic bladder 07/07/2019  . 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

## 2019-10-22 ENCOUNTER — Ambulatory Visit
Payer: No Typology Code available for payment source | Attending: Physical Medicine and Rehabilitation | Admitting: Physical Therapy

## 2019-10-22 ENCOUNTER — Other Ambulatory Visit: Payer: Self-pay

## 2019-10-22 ENCOUNTER — Encounter: Payer: Self-pay | Admitting: Physical Therapy

## 2019-10-22 DIAGNOSIS — R293 Abnormal posture: Secondary | ICD-10-CM | POA: Insufficient documentation

## 2019-10-22 DIAGNOSIS — G8222 Paraplegia, incomplete: Secondary | ICD-10-CM | POA: Diagnosis present

## 2019-10-22 DIAGNOSIS — R29818 Other symptoms and signs involving the nervous system: Secondary | ICD-10-CM | POA: Diagnosis present

## 2019-10-22 DIAGNOSIS — M6281 Muscle weakness (generalized): Secondary | ICD-10-CM | POA: Diagnosis present

## 2019-10-23 NOTE — Therapy (Signed)
[facility] [address] [place], [name], [address] Phone: [phone]   Fax:  [phone]  Physical Therapy Treatment  Patient Details  Name: Timothy Davenport MRN: [id number] Date of Birth: 1950-11-05 Referring Provider (PT): Juel Burrow    Encounter Date: 10/22/2019   PT End of Session - 10/22/19 1236    Visit Number 9    Number of Visits 16    Date for PT Re-Evaluation 01/26/20   15 visits in 120 days   Authorization Type VA    Authorization - Visit Number 8    Authorization - Number of Visits 15    Progress Note Due on Visit 10    PT Start Time 1232    PT Stop Time 1318    PT Time Calculation (min) 46 min    Equipment Utilized During Treatment Gait belt    Activity Tolerance Patient tolerated treatment well    Behavior During Therapy [facility] for tasks assessed/performed           Past Medical History:  Diagnosis Date  . Colon polyp   . Diabetes mellitus without complication ([place])   . Diabetic neuropathy ([place])   . HTN (hypertension)   . Patella fracture   . Renal calculi     Past Surgical History:  Procedure Laterality Date  . LUMBAR LAMINECTOMY/DECOMPRESSION MICRODISCECTOMY N/A 07/01/2019   Procedure: LUMBAR LAMINECTOMY/DECOMPRESSION MICRODISCECTOMY 1 LEVEL, THORACIC SIX-SEVEN;  Surgeon: Consuella Lose, MD;  Location: [facility];  Service: Neurosurgery;  Laterality: N/A;  LUMBAR LAMINECTOMY/DECOMPRESSION MICRODISCECTOMY 1 LEVEL, THORACIC SIX-SEVEN   . ORIF PATELLA    . THORACIC DISCECTOMY N/A 07/03/2019   Procedure: Evacuation of Thoracic Hematoma;  Surgeon: Consuella Lose, MD;  Location: [facility];  Service: Neurosurgery;  Laterality: N/A;    There were no vitals filed for this visit.   Subjective Assessment - 10/22/19 1235    Subjective No new complaints. No falls or pain.    Pertinent History T8 paraplegia ASIA B, diabetes, HTN    How long can you stand comfortably? reports he can stand for 30  seconds with a RW (with HHPT)    Patient Stated Goals wants to get out of here walking.    Currently in Pain? No/denies                [facility] Adult PT Treatment/Exercise - 10/22/19 1239      Transfers   Transfers Lateral/Scoot Transfers;Sit to Stand;Stand to Sit    Sit to Stand 4: Min assist;With upper extremity assist;From chair/3-in-1;Other (comment);4: Min guard   from steady (min guard assist)   Stand to Sit 4: Min guard;4: Min assist;With upper extremity assist;To bed;Other (comment)   to stedy   Comments worked on standing posture and balance in [place] lift assist. while in stedy standing had pt perform quad sets for improved knee extension during on time of 6 secs, rest for 8 sec's. several stands performed with max standing time of 1 minute 38.85 sec's. min guard to min assist in standing for balance.       Exercises   Exercises Other Exercises    Other Exercises  concurrent with Bioness to bil LE's: with feet on balance board in ant/post direction- AA DF with on times, rest with off times for 10 reps; long arc quads with DF with on time with min AA from PTA, rest with off times for 10 reps each side, performed individually; with UE's on mat table- hovering above mat with ant  weight shift working toward reverse squat position for 10 reps with hand moving to chair in front of pt for last 5 reps, assist needed to clear mat of buttocks       Electrical Stimulation   Electrical Stimulation Location bil anterior tib and quads    Electrical Stimulation Action for increased muscle activation and strengtheing    Electrical Stimulation Parameters refer to tablet 1 for adjusted parameters; quickfit electrodes    Electrical Stimulation Goals Strength;Neuromuscular facilitation                    PT Short Term Goals - 10/22/19 1236      PT SHORT TERM GOAL #1   Title Pt and spouse will be independent with initial HEP for BLE stretching and strengthening. ALL STGS DUE 10/25/19     Baseline 10/22/19: met with current program with spouse assist    Time --   due to delay in scheduling   Status Achieved      PT SHORT TERM GOAL #2   Title Pt will perform lateral scoot transfer with min A for use of set up of slide board in order to decr caregiver burden.    Baseline performs with supervision    Status Achieved      PT SHORT TERM GOAL #3   Title Pt will perform standing in [place] with min/mod A for at least 60 seconds for functional weight bearing.    Baseline 10/22/19: met in session today    Time --    Period --    Status Achieved      PT SHORT TERM GOAL #4   Title Pt will verbalize understanding of performing pressure relief in order to decr risk of pressure sores.    Baseline pt reports performing pressure relief every 1-2 hrs, verbalizes understanding of performing every 20-30 minutes    Status Partially Met      PT SHORT TERM GOAL #5   Title Pt will perform all bed mobility with min guard/supervision in order to decr caregiver burden.    Baseline 10/22/19: pt reports doing this on his own at home, supine<>sit edge of bed and rolling    Status Achieved             PT Long Term Goals - 09/20/19 2138      PT LONG TERM GOAL #1   Title Pt and spouse will be independent with final HEP for BLE stretching and strengthening. ALL LTGS DUE 11/22/19    Time 9    Period Weeks    Status New    Target Date 11/22/19      PT LONG TERM GOAL #2   Title Pt will perform all bed mobility with mod I in order to decr caregiver burden.    Time 9    Period Weeks    Status New      PT LONG TERM GOAL #3   Title Pt will perform squat pivot transfer from w/c <> mat table with supervision in order to demo improved functional mobility.    Time 9    Period Weeks    Status New      PT LONG TERM GOAL #4   Title Pt will perform sit <> stand with RW with mod A in order to demo improved functional mobility.    Time 9    Period Weeks    Status New  Plan -  10/22/19 1236    Clinical Impression Statement Today's skilled session continued to focus on use of Bioness to bil LE's with ex's and standing in [place] lift assist. Pt also met both remaining STGs this session. The pt is progressing toward goals and should benefit from continued PT to progress toward unmet goals    Personal Factors and Comorbidities Comorbidity 3+;Past/Current Experience    Comorbidities T8 paraplegia, diabetes, HTN    Examination-Activity Limitations Bed Mobility;Locomotion Level;Transfers;Stand    Examination-Participation Restrictions Community Activity;Yard Work    Merchant navy officer Evolving/Moderate complexity    PT Frequency 2x / week    PT Duration --   7 weeks   PT Treatment/Interventions Aquatic Therapy;Manual techniques;Therapeutic exercise;Gait training;Neuromuscular re-education;Orthotic Fit/Training    PT Next Visit Plan 10th visit progress noted due. continue with Bioness to bil LE's with ex's, standing, pre-gait activities; continue to work on core strengthening- quadruped, tall kneeling when appropriate    PT Home Exercise Plan Access Code: [email or web address]    Consulted and Agree with Plan of Care Patient;Family member/caregiver    Family Member Consulted wife, [name]           Patient will benefit from skilled therapeutic intervention in order to improve the following deficits and impairments:  Decreased balance, Decreased coordination, Decreased range of motion, Decreased strength, Decreased mobility, Impaired sensation, Postural dysfunction, Impaired tone, Decreased activity tolerance  Visit Diagnosis: Muscle weakness (generalized)  Other symptoms and signs involving the nervous system  Abnormal posture  Paraplegia, incomplete ([place])     Problem List Patient Active Problem List   Diagnosis Date Noted  . Spasticity 08/03/2019  . Paraplegia ([place]) 07/07/2019  . Neurogenic bowel 07/07/2019  . Neurogenic bladder 07/07/2019  . Thoracic  myelopathy 06/30/2019    Willow Ora, PTA, [facility] Outpatient Neuro [facility] [address], [place],  [address] [phone] 10/23/19, 10:56 AM   Name: Timothy Davenport MRN: [id number] Date of Birth: 10/03/1950

## 2019-10-27 ENCOUNTER — Ambulatory Visit: Payer: No Typology Code available for payment source | Admitting: Physical Therapy

## 2019-10-27 ENCOUNTER — Other Ambulatory Visit: Payer: Self-pay

## 2019-10-27 DIAGNOSIS — M6281 Muscle weakness (generalized): Secondary | ICD-10-CM | POA: Diagnosis not present

## 2019-10-27 DIAGNOSIS — G8222 Paraplegia, incomplete: Secondary | ICD-10-CM

## 2019-10-27 DIAGNOSIS — R29818 Other symptoms and signs involving the nervous system: Secondary | ICD-10-CM

## 2019-10-27 DIAGNOSIS — R293 Abnormal posture: Secondary | ICD-10-CM

## 2019-10-27 NOTE — Therapy (Signed)
[facility] [address] [place], [name], [address] Phone: [phone]   Fax:  [phone]  Physical Therapy Treatment/10th Visit Progress Note  Patient Details  Name: Timothy Davenport MRN: [id number] Date of Birth: 1950/02/26 Referring Provider (PT): Juel Burrow   10th Visit Physical Therapy Progress Note  Dates of Reporting Period: 09/17/19 to 10/27/19    Encounter Date: 10/27/2019   PT End of Session - 10/27/19 1112    Visit Number 10    Number of Visits 16    Date for PT Re-Evaluation 01/26/20   15 visits in 120 days   Authorization Type VA    Authorization - Visit Number 9    Authorization - Number of Visits 15    Progress Note Due on Visit 10    PT Start Time 0109    PT Stop Time 1100    PT Time Calculation (min) 45 min    Equipment Utilized During Treatment Gait belt    Activity Tolerance Patient tolerated treatment well    Behavior During Therapy [facility] for tasks assessed/performed           Past Medical History:  Diagnosis Date  . Colon polyp   . Diabetes mellitus without complication ([place])   . Diabetic neuropathy ([place])   . HTN (hypertension)   . Patella fracture   . Renal calculi     Past Surgical History:  Procedure Laterality Date  . LUMBAR LAMINECTOMY/DECOMPRESSION MICRODISCECTOMY N/A 07/01/2019   Procedure: LUMBAR LAMINECTOMY/DECOMPRESSION MICRODISCECTOMY 1 LEVEL, THORACIC SIX-SEVEN;  Surgeon: Consuella Lose, MD;  Location: [facility];  Service: Neurosurgery;  Laterality: N/A;  LUMBAR LAMINECTOMY/DECOMPRESSION MICRODISCECTOMY 1 LEVEL, THORACIC SIX-SEVEN   . ORIF PATELLA    . THORACIC DISCECTOMY N/A 07/03/2019   Procedure: Evacuation of Thoracic Hematoma;  Surgeon: Consuella Lose, MD;  Location: [facility];  Service: Neurosurgery;  Laterality: N/A;    There were no vitals filed for this visit.   Subjective Assessment - 10/27/19 1019    Subjective No changes. No falls.    Pertinent History  T8 paraplegia ASIA B, diabetes, HTN    How long can you stand comfortably? reports he can stand for 30 seconds with a RW (with HHPT)    Patient Stated Goals wants to get out of here walking.    Currently in Pain? No/denies                             [facility] Adult PT Treatment/Exercise - 10/27/19 0001      Transfers   Transfers Lateral/Scoot Transfers;Sit to Stand;Stand to Sit    Sit to Stand 4: Min assist;With upper extremity assist;From chair/3-in-1;Other (comment);4: Min guard   from [place] to mat table   Stand to Sit 4: Min guard;4: Min assist;With upper extremity assist;To bed;Other (comment)   to [facility]     Neuro Re-ed    Neuro Re-ed Details  With Bioness to B ant tib and B quads: x10 reps partial sit <> stands from [place] on time of 8 seconds off time of 8 seconds, from [place] x5 reps mini squats on time of 6 seconds in mini squat and off time of 8 seconds in standing. attempted to perform another 5 reps after a prolonged rest break, however pt only able to perform 2. Hooklying on mat table: alternating between each leg SAQs x10 reps B 5 second on time, 8 second off, with therapist providing min A for full  range knee extension       Exercises   Exercises Other Exercises    Other Exercises  without Bioness: hooklying on mat table bent knee fall outs 2 x 5 reps B, cues for slowed and controlled with ocassional min A      Electrical Stimulation   Electrical Stimulation Location bil anterior tib and quads    Electrical Stimulation Action for increased muscle activation and strengthening    Electrical Stimulation Parameters refer to tablet 1 for adjusted parameters; quick fit electrodes    Electrical Stimulation Goals Strength;Neuromuscular facilitation                    PT Short Term Goals - 10/22/19 1236      PT SHORT TERM GOAL #1   Title Pt and spouse will be independent with initial HEP for BLE stretching and strengthening. ALL STGS DUE 10/25/19    Baseline  10/22/19: met with current program with spouse assist    Time --   due to delay in scheduling   Status Achieved      PT SHORT TERM GOAL #2   Title Pt will perform lateral scoot transfer with min A for use of set up of slide board in order to decr caregiver burden.    Baseline performs with supervision    Status Achieved      PT SHORT TERM GOAL #3   Title Pt will perform standing in [place] with min/mod A for at least 60 seconds for functional weight bearing.    Baseline 10/22/19: met in session today    Time --    Period --    Status Achieved      PT SHORT TERM GOAL #4   Title Pt will verbalize understanding of performing pressure relief in order to decr risk of pressure sores.    Baseline pt reports performing pressure relief every 1-2 hrs, verbalizes understanding of performing every 20-30 minutes    Status Partially Met      PT SHORT TERM GOAL #5   Title Pt will perform all bed mobility with min guard/supervision in order to decr caregiver burden.    Baseline 10/22/19: pt reports doing this on his own at home, supine<>sit edge of bed and rolling    Status Achieved             PT Long Term Goals - 09/20/19 2138      PT LONG TERM GOAL #1   Title Pt and spouse will be independent with final HEP for BLE stretching and strengthening. ALL LTGS DUE 11/22/19    Time 9    Period Weeks    Status New    Target Date 11/22/19      PT LONG TERM GOAL #2   Title Pt will perform all bed mobility with mod I in order to decr caregiver burden.    Time 9    Period Weeks    Status New      PT LONG TERM GOAL #3   Title Pt will perform squat pivot transfer from w/c <> mat table with supervision in order to demo improved functional mobility.    Time 9    Period Weeks    Status New      PT LONG TERM GOAL #4   Title Pt will perform sit <> stand with RW with mod A in order to demo improved functional mobility.    Time 9    Period Weeks  Status New                 Plan - 10/27/19  1123    Clinical Impression Statement 10th visit progress note: STGs assessed at previous session with pt meeting 4 out of 5 STGs. Pt able to stand in the [place] with a max time of 1 minute 38.85 sec's. Focus of today's session continued to focus on BLE strengthening with use of Bioness to B ant tibs and quads. Rest breaks taken as needed due to fatigue. Will continue to progress towards LTGs.    Personal Factors and Comorbidities Comorbidity 3+;Past/Current Experience    Comorbidities T8 paraplegia, diabetes, HTN    Examination-Activity Limitations Bed Mobility;Locomotion Level;Transfers;Stand    Examination-Participation Restrictions Community Activity;Yard Work    Merchant navy officer Evolving/Moderate complexity    PT Frequency 2x / week    PT Duration --   7 weeks   PT Treatment/Interventions Aquatic Therapy;Manual techniques;Therapeutic exercise;Gait training;Neuromuscular re-education;Orthotic Fit/Training    PT Next Visit Plan continue with Bioness to bil LE's with ex's, standing, pre-gait activities; continue to work on core strengthening- quadruped, tall kneeling when appropriate    PT Home Exercise Plan Access Code: [email or web address]    Consulted and Agree with Plan of Care Patient;Family member/caregiver    Family Member Consulted wife, [name]           Patient will benefit from skilled therapeutic intervention in order to improve the following deficits and impairments:  Decreased balance, Decreased coordination, Decreased range of motion, Decreased strength, Decreased mobility, Impaired sensation, Postural dysfunction, Impaired tone, Decreased activity tolerance  Visit Diagnosis: Muscle weakness (generalized)  Other symptoms and signs involving the nervous system  Abnormal posture  Paraplegia, incomplete ([place])     Problem List Patient Active Problem List   Diagnosis Date Noted  . Spasticity 08/03/2019  . Paraplegia ([place]) 07/07/2019  . Neurogenic bowel 07/07/2019   . Neurogenic bladder 07/07/2019  . Thoracic myelopathy 06/30/2019    Arliss Journey, PT, DPT  10/27/2019, 11:23 AM  [facility] [address] [place], [name], [address] Phone: [phone]   Fax:  [phone]  Name: Timothy Davenport MRN: [id number] Date of Birth: [birth year]

## 2019-10-30 ENCOUNTER — Ambulatory Visit: Payer: No Typology Code available for payment source | Admitting: Physical Therapy

## 2019-10-30 ENCOUNTER — Other Ambulatory Visit: Payer: Self-pay

## 2019-10-30 DIAGNOSIS — R29818 Other symptoms and signs involving the nervous system: Secondary | ICD-10-CM

## 2019-10-30 DIAGNOSIS — G8222 Paraplegia, incomplete: Secondary | ICD-10-CM

## 2019-10-30 DIAGNOSIS — M6281 Muscle weakness (generalized): Secondary | ICD-10-CM

## 2019-10-30 DIAGNOSIS — R293 Abnormal posture: Secondary | ICD-10-CM

## 2019-10-30 NOTE — Therapy (Signed)
[facility] [address] [place], [name], [address] Phone: [phone]   Fax:  [phone]  Physical Therapy Treatment  Patient Details  Name: Timothy Davenport MRN: [id number] Date of Birth: February 21, 1950 Referring Provider (PT): Juel Burrow    Encounter Date: 10/30/2019   PT End of Session - 10/30/19 1217    Visit Number 11    Number of Visits 16    Date for PT Re-Evaluation 01/26/20   15 visits in 120 days   Authorization Type [facility]    Authorization - Visit Number 10    Authorization - Number of Visits 15    Progress Note Due on Visit 10    PT Start Time 1016    PT Stop Time 1058    PT Time Calculation (min) 42 min    Equipment Utilized During Treatment Gait belt    Activity Tolerance Patient tolerated treatment well    Behavior During Therapy WFL for tasks assessed/performed           Past Medical History:  Diagnosis Date  . Colon polyp   . Diabetes mellitus without complication ([place])   . Diabetic neuropathy ([place])   . HTN (hypertension)   . Patella fracture   . Renal calculi     Past Surgical History:  Procedure Laterality Date  . LUMBAR LAMINECTOMY/DECOMPRESSION MICRODISCECTOMY N/A 07/01/2019   Procedure: LUMBAR LAMINECTOMY/DECOMPRESSION MICRODISCECTOMY 1 LEVEL, THORACIC SIX-SEVEN;  Surgeon: Consuella Lose, MD;  Location: [facility];  Service: Neurosurgery;  Laterality: N/A;  LUMBAR LAMINECTOMY/DECOMPRESSION MICRODISCECTOMY 1 LEVEL, THORACIC SIX-SEVEN   . ORIF PATELLA    . THORACIC DISCECTOMY N/A 07/03/2019   Procedure: Evacuation of Thoracic Hematoma;  Surgeon: Consuella Lose, MD;  Location: [facility];  Service: Neurosurgery;  Laterality: N/A;    There were no vitals filed for this visit.   Subjective Assessment - 10/30/19 1044    Subjective Nothing new since last time.    Pertinent History T8 paraplegia ASIA B, diabetes, HTN    How long can you stand comfortably? reports he can stand for 30 seconds  with a RW (with HHPT)    Patient Stated Goals wants to get out of here walking.    Currently in Pain? No/denies                             [facility] Adult PT Treatment/Exercise - 10/30/19 1044      Transfers   Sit to Stand 4: Min assist;From bed   x2 with RW   Sit to Stand Details Verbal cues for sequencing;Verbal cues for precautions/safety;Verbal cues for technique    Sit to Stand Details (indicate cue type and reason) from 25" mat table: 3 reps sit <> stands without Bioness on, min A x2, pt needing to press up with BUE on RW to stand, attempted with single hand on mat and other hand on RW, however pt unable to stand with this technique     Stand to Sit 4: Min guard    Stand to Sit Details with RW    Number of Reps 2 sets;Other reps (comment)   5   Transfer Cueing with Bioness on time 10 seconds, off time 15 seconds: performed with RW, min A x2 for safety, with tactile cues for B knee extension in standing, from 25" mat table, pt needing to press up to stand with BUE support on RW   Comments standing at RW: x30 seconds, x40  seconds, x60 seconds - with min guard/min A x2       Neuro Re-ed    Neuro Re-ed Details  buttock raises with pt pressing up with BUE from elevated mat table, on time 6 seconds off time 10 seconds x5 reps                    PT Short Term Goals - 10/22/19 1236      PT SHORT TERM GOAL #1   Title Pt and spouse will be independent with initial HEP for BLE stretching and strengthening. ALL STGS DUE 10/25/19    Baseline 10/22/19: met with current program with spouse assist    Time --   due to delay in scheduling   Status Achieved      PT SHORT TERM GOAL #2   Title Pt will perform lateral scoot transfer with min A for use of set up of slide board in order to decr caregiver burden.    Baseline performs with supervision    Status Achieved      PT SHORT TERM GOAL #3   Title Pt will perform standing in St[place] with min/mod A for at least 60 seconds  for functional weight bearing.    Baseline 10/22/19: met in session today    Time --    Period --    Status Achieved      PT SHORT TERM GOAL #4   Title Pt will verbalize understanding of performing pressure relief in order to decr risk of pressure sores.    Baseline pt reports performing pressure relief every 1-2 hrs, verbalizes understanding of performing every 20-30 minutes    Status Partially Met      PT SHORT TERM GOAL #5   Title Pt will perform all bed mobility with min guard/supervision in order to decr caregiver burden.    Baseline 10/22/19: pt reports doing this on his own at home, supine<>sit edge of bed and rolling    Status Achieved             PT Long Term Goals - 09/20/19 2138      PT LONG TERM GOAL #1   Title Pt and spouse will be independent with final HEP for BLE stretching and strengthening. ALL LTGS DUE 11/22/19    Time 9    Period Weeks    Status New    Target Date 11/22/19      PT LONG TERM GOAL #2   Title Pt will perform all bed mobility with mod I in order to decr caregiver burden.    Time 9    Period Weeks    Status New      PT LONG TERM GOAL #3   Title Pt will perform squat pivot transfer from w/c <> mat table with supervision in order to demo improved functional mobility.    Time 9    Period Weeks    Status New      PT LONG TERM GOAL #4   Title Pt will perform sit <> stand with RW with mod A in order to demo improved functional mobility.    Time 9    Period Weeks    Status New                 Plan - 10/30/19 1219    Clinical Impression Statement Today's skilled session continued to focus on use of Bioness to BLE for strengthening. Pt able to perform 3 reps of sit <> stands  without use of Bioness (performed from elevated mat table with min A and 2nd person to assist for safety as needed). Pt with improved knee extension today and able to stand for a max of 60 seconds at RW before needing a seated rest break. Will continue to progress  towards LTGs.    Personal Factors and Comorbidities Comorbidity 3+;Past/Current Experience    Comorbidities T8 paraplegia, diabetes, HTN    Examination-Activity Limitations Bed Mobility;Locomotion Level;Transfers;Stand    Examination-Participation Restrictions Community Activity;Yard Work    Merchant navy officer Evolving/Moderate complexity    PT Frequency 2x / week    PT Duration --   7 weeks   PT Treatment/Interventions Aquatic Therapy;Manual techniques;Therapeutic exercise;Gait training;Neuromuscular re-education;Orthotic Fit/Training    PT Next Visit Plan continue with Bioness to bil LE's with ex's, sit <> stands with RW, standing, pre-gait activities; continue to work on core strengthening- quadruped, tall kneeling when appropriate    PT Home Exercise Plan Access Code: [email or web address]    Consulted and Agree with Plan of Care Patient;Family member/caregiver    Family Member Consulted wife, [name]           Patient will benefit from skilled therapeutic intervention in order to improve the following deficits and impairments:  Decreased balance, Decreased coordination, Decreased range of motion, Decreased strength, Decreased mobility, Impaired sensation, Postural dysfunction, Impaired tone, Decreased activity tolerance  Visit Diagnosis: Other symptoms and signs involving the nervous system  Muscle weakness (generalized)  Abnormal posture  Paraplegia, incomplete ([place])     Problem List Patient Active Problem List   Diagnosis Date Noted  . Spasticity 08/03/2019  . Paraplegia ([place]) 07/07/2019  . Neurogenic bowel 07/07/2019  . Neurogenic bladder 07/07/2019  . 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

## 2019-11-03 ENCOUNTER — Other Ambulatory Visit: Payer: Self-pay

## 2019-11-03 ENCOUNTER — Encounter: Payer: Self-pay | Admitting: Physical Therapy

## 2019-11-03 ENCOUNTER — Ambulatory Visit: Payer: No Typology Code available for payment source | Admitting: Physical Therapy

## 2019-11-03 DIAGNOSIS — M6281 Muscle weakness (generalized): Secondary | ICD-10-CM

## 2019-11-03 DIAGNOSIS — G8222 Paraplegia, incomplete: Secondary | ICD-10-CM

## 2019-11-03 DIAGNOSIS — R29818 Other symptoms and signs involving the nervous system: Secondary | ICD-10-CM

## 2019-11-03 DIAGNOSIS — R293 Abnormal posture: Secondary | ICD-10-CM

## 2019-11-03 NOTE — Therapy (Addendum)
[facility] [address] [place], [name], [address] Phone: [phone]   Fax:  [phone]  Physical Therapy Treatment  Patient Details  Name: Timothy Davenport MRN: [id number] Date of Birth: 1950-03-18 Referring Provider (PT): Juel Burrow    Encounter Date: 11/03/2019   PT End of Session - 11/03/19 1021    Visit Number 12    Number of Visits 16    Date for PT Re-Evaluation 01/26/20   15 visits in 120 days   Authorization Type [facility]    Authorization - Visit Number 12    Authorization - Number of Visits 15    Progress Note Due on Visit 20    PT Start Time 1018    PT Stop Time 1100    PT Time Calculation (min) 42 min    Equipment Utilized During Treatment Gait belt    Activity Tolerance Patient tolerated treatment well    Behavior During Therapy WFL for tasks assessed/performed           Past Medical History:  Diagnosis Date  . Colon polyp   . Diabetes mellitus without complication ([place])   . Diabetic neuropathy ([place])   . HTN (hypertension)   . Patella fracture   . Renal calculi     Past Surgical History:  Procedure Laterality Date  . LUMBAR LAMINECTOMY/DECOMPRESSION MICRODISCECTOMY N/A 07/01/2019   Procedure: LUMBAR LAMINECTOMY/DECOMPRESSION MICRODISCECTOMY 1 LEVEL, THORACIC SIX-SEVEN;  Surgeon: Consuella Lose, MD;  Location: [facility];  Service: Neurosurgery;  Laterality: N/A;  LUMBAR LAMINECTOMY/DECOMPRESSION MICRODISCECTOMY 1 LEVEL, THORACIC SIX-SEVEN   . ORIF PATELLA    . THORACIC DISCECTOMY N/A 07/03/2019   Procedure: Evacuation of Thoracic Hematoma;  Surgeon: Consuella Lose, MD;  Location: [facility];  Service: Neurosurgery;  Laterality: N/A;    There were no vitals filed for this visit.   Subjective Assessment - 11/03/19 1020    Subjective No new complaints.    Pertinent History T8 paraplegia ASIA B, diabetes, HTN    How long can you stand comfortably? reports he can stand for 30 seconds with a RW  (with HHPT)    Patient Stated Goals wants to get out of here walking.    Currently in Pain? No/denies                  [facility] Adult PT Treatment/Exercise - 11/03/19 1022      Transfers   Transfers Lateral/Scoot Transfers;Sit to Stand;Stand to Sit    Sit to Stand 4: Min assist;From bed;With upper extremity assist    Sit to Stand Details Verbal cues for sequencing;Verbal cues for precautions/safety;Verbal cues for technique    Sit to Stand Details (indicate cue type and reason) from mat to/from RW with bil knees blocked by PTA seated in front of pt with assist to stand provided at pelvis by gait belt.     Stand to Sit 4: Min assist;With upper extremity assist;To bed    Stand to Sit Details (indicate cue type and reason) Verbal cues for sequencing;Verbal cues for technique;Verbal cues for safe use of DME/AE    Stand to Sit Details RW<>mat table with assist for controlled descent    Comments block practice standing mat table<>RW. In standing had pt work on knee flexion/mini squats with on time of Bioness for 6 sec's, resting with knee extension for off time fo 10 sec's with PTA guarding bil knees for stability and supporing pt via gait belt at hips/pelvis. Pt continues to have a heavy reliance on  walker with UE's,. Unable to work on single UE support with bil knees blocked in extension due to this       Exercises   Exercises Other Exercises    Other Exercises  concurrent with Bioness to bil LE's: Long arc quads with DF with on time of 8 seconds, rest for 10 seconds for 10 reps each side. AA needed at times right>left for full range.       Acupuncturist Location bil anterior tib and Designer, television/film set Action for increased strengthening and muscle activation    Electrical Stimulation Parameters refer to tablet 1 for adjusted parameters; quickfit electrodes    Electrical Stimulation Goals Strength;Neuromuscular facilitation                     PT Short Term Goals - 10/22/19 1236      PT SHORT TERM GOAL #1   Title Pt and spouse will be independent with initial HEP for BLE stretching and strengthening. ALL STGS DUE 10/25/19    Baseline 10/22/19: met with current program with spouse assist    Time --   due to delay in scheduling   Status Achieved      PT SHORT TERM GOAL #2   Title Pt will perform lateral scoot transfer with min A for use of set up of slide board in order to decr caregiver burden.    Baseline performs with supervision    Status Achieved      PT SHORT TERM GOAL #3   Title Pt will perform standing in St[place] with min/mod A for at least 60 seconds for functional weight bearing.    Baseline 10/22/19: met in session today    Time --    Period --    Status Achieved      PT SHORT TERM GOAL #4   Title Pt will verbalize understanding of performing pressure relief in order to decr risk of pressure sores.    Baseline pt reports performing pressure relief every 1-2 hrs, verbalizes understanding of performing every 20-30 minutes    Status Partially Met      PT SHORT TERM GOAL #5   Title Pt will perform all bed mobility with min guard/supervision in order to decr caregiver burden.    Baseline 10/22/19: pt reports doing this on his own at home, supine<>sit edge of bed and rolling    Status Achieved             PT Long Term Goals - 09/20/19 2138      PT LONG TERM GOAL #1   Title Pt and spouse will be independent with final HEP for BLE stretching and strengthening. ALL LTGS DUE 11/22/19    Time 9    Period Weeks    Status New    Target Date 11/22/19      PT LONG TERM GOAL #2   Title Pt will perform all bed mobility with mod I in order to decr caregiver burden.    Time 9    Period Weeks    Status New      PT LONG TERM GOAL #3   Title Pt will perform squat pivot transfer from w/c <> mat table with supervision in order to demo improved functional mobility.    Time 9    Period Weeks    Status New       PT LONG TERM GOAL #4   Title Pt will perform sit <>  stand with RW with mod A in order to demo improved functional mobility.    Time 9    Period Weeks    Status New                 Plan - 11/03/19 1022    Clinical Impression Statement Today's skilled session continued to focus on use of Bioness to bil LE's concurrent with ex's and standing. Rest breaks taken as needed due to fatigue. Pt was able to increase his standing time at RW to 1 minute 45 sec' max, with every stand being over a minute long. Pt able to perform sit <> stands with RW with min A, pt still relying on BUE to perform sit <> stand. The pt is progressing toward goals and should benefit from continued PT to progress toward unmet goals. Pt will continue to benefit from skilled PT to continue to work more on functional strengthening, balance, standing tolerance, and transfers to improve independence if more authorization can get approved. Requesting additional 15 visits.    Personal Factors and Comorbidities Comorbidity 3+;Past/Current Experience    Comorbidities T8 paraplegia, diabetes, HTN    Examination-Activity Limitations Bed Mobility;Locomotion Level;Transfers;Stand    Examination-Participation Restrictions Community Activity;Yard Work    Merchant navy officer Evolving/Moderate complexity    PT Frequency 2x / week    PT Duration --   7 weeks   PT Treatment/Interventions Aquatic Therapy;Manual techniques;Therapeutic exercise;Gait training;Neuromuscular re-education;Orthotic Fit/Training    PT Next Visit Plan continue with Bioness to bil LE's with ex's, sit <> stands with RW, standing, pre-gait activities; continue to work on core strengthening- quadruped, tall kneeling when appropriate    PT Home Exercise Plan Access Code: [email or web address]    Consulted and Agree with Plan of Care Patient;Family member/caregiver    Family Member Consulted wife, [name]           Patient will benefit from skilled therapeutic  intervention in order to improve the following deficits and impairments:  Decreased balance, Decreased coordination, Decreased range of motion, Decreased strength, Decreased mobility, Impaired sensation, Postural dysfunction, Impaired tone, Decreased activity tolerance  Visit Diagnosis: Other symptoms and signs involving the nervous system  Muscle weakness (generalized)  Abnormal posture  Paraplegia, incomplete ([place])     Problem List Patient Active Problem List   Diagnosis Date Noted  . Spasticity 08/03/2019  . Paraplegia ([place]) 07/07/2019  . Neurogenic bowel 07/07/2019  . Neurogenic bladder 07/07/2019  . Thoracic myelopathy 06/30/2019    Willow Ora, PTA, [facility] Outpatient Neuro [facility] [address] [place], [place]  [address] [phone] 11/04/19, 11:05 AM   Name: Timothy Davenport MRN: [id number] Date of Birth: 01-08-1951   Addended by:   Janann August, PT, DPT 11/06/19 8:49 AM

## 2019-11-05 ENCOUNTER — Ambulatory Visit: Payer: No Typology Code available for payment source | Admitting: Physical Therapy

## 2019-11-05 ENCOUNTER — Encounter: Payer: Self-pay | Admitting: Physical Therapy

## 2019-11-05 ENCOUNTER — Other Ambulatory Visit: Payer: Self-pay

## 2019-11-05 DIAGNOSIS — G8222 Paraplegia, incomplete: Secondary | ICD-10-CM

## 2019-11-05 DIAGNOSIS — R29818 Other symptoms and signs involving the nervous system: Secondary | ICD-10-CM

## 2019-11-05 DIAGNOSIS — M6281 Muscle weakness (generalized): Secondary | ICD-10-CM

## 2019-11-05 DIAGNOSIS — R293 Abnormal posture: Secondary | ICD-10-CM

## 2019-11-06 NOTE — Therapy (Signed)
[facility] [address] [place], [name], [address] Phone: [phone]   Fax:  [phone]  Physical Therapy Treatment  Patient Details  Name: Timothy Davenport MRN: [id number] Date of Birth: Aug 06, 1950 Referring Provider (PT): Juel Burrow    Encounter Date: 11/05/2019   PT End of Session - 11/05/19 1103    Visit Number 13    Number of Visits 16    Date for PT Re-Evaluation 01/26/20   15 visits in 120 days   Authorization Type [facility]    Authorization - Visit Number 47    Authorization - Number of Visits 15    Progress Note Due on Visit 20    PT Start Time 1101    PT Stop Time 1144    PT Time Calculation (min) 43 min    Equipment Utilized During Treatment Gait belt    Activity Tolerance Patient tolerated treatment well    Behavior During Therapy WFL for tasks assessed/performed           Past Medical History:  Diagnosis Date  . Colon polyp   . Diabetes mellitus without complication ([facility])   . Diabetic neuropathy ([place])   . HTN (hypertension)   . Patella fracture   . Renal calculi     Past Surgical History:  Procedure Laterality Date  . LUMBAR LAMINECTOMY/DECOMPRESSION MICRODISCECTOMY N/A 07/01/2019   Procedure: LUMBAR LAMINECTOMY/DECOMPRESSION MICRODISCECTOMY 1 LEVEL, THORACIC SIX-SEVEN;  Surgeon: Consuella Lose, MD;  Location: [facility];  Service: Neurosurgery;  Laterality: N/A;  LUMBAR LAMINECTOMY/DECOMPRESSION MICRODISCECTOMY 1 LEVEL, THORACIC SIX-SEVEN   . ORIF PATELLA    . THORACIC DISCECTOMY N/A 07/03/2019   Procedure: Evacuation of Thoracic Hematoma;  Surgeon: Consuella Lose, MD;  Location: [facility];  Service: Neurosurgery;  Laterality: N/A;    There were no vitals filed for this visit.   Subjective Assessment - 11/05/19 1103    Subjective No new complaints.    Pertinent History T8 paraplegia ASIA B, diabetes, HTN    How long can you stand comfortably? reports he can stand for 30 seconds with a RW  (with HHPT)    Patient Stated Goals wants to get out of here walking.    Currently in Pain? No/denies                 [facility] Adult PT Treatment/Exercise - 11/05/19 1104      Transfers   Transfers Lateral/Scoot Transfers;Sit to Stand;Stand to Sit    Sit to Stand 4: Min assist;From bed;With upper extremity assist;Other (comment)    Sit to Stand Details Verbal cues for sequencing;Verbal cues for precautions/safety;Verbal cues for technique    Sit to Stand Details (indicate cue type and reason) from mat to RW with bil knees blocked and belt at pelvis used to faciliation anterior translation of pelvis    Stand to Sit 4: Min assist;With upper extremity assist;To bed    Stand to Sit Details (indicate cue type and reason) Verbal cues for sequencing;Verbal cues for technique;Verbal cues for safe use of DME/AE    Stand to Sit Details assist needed for controlled descent.     Lateral/Scoot Transfers 5: Supervision    Lateral/Scoot Transfer Details (indicate cue type and reason) w/c<>mat table with slide board    Comments performed on stand at [facility] with min/mod assist- working on minisquats/knee flexion with on time of 6 sec's/rest with off time of 10 sec's for 6 rounds before needing to sit down.       Exercises  Exercises Other Exercises    Other Exercises  concurrent with Bioness to bil LE's while seated at edge of mat table: toe raises with on time of 6 sec's, rest with off time, then long arc quads with on time of 6 sec's, rest with off times, then marching during 6 sec on time, rest with off times, all for 2 set's of 10 reps. then without Bioness- with yellow band resistance- hamstring curls/foot slids on floor for 2 sets of 10 reps. cues/sometimes assist needed for controlled movements.       Acupuncturist Location bil anterior tib and Designer, television/film set Action for increased muscle strengthening and activation    Electrical Stimulation  Parameters refer to tablet 1 for adjusted parameters; quick fit electrodes    Electrical Stimulation Goals Strength;Neuromuscular facilitation               PT Short Term Goals - 10/22/19 1236      PT SHORT TERM GOAL #1   Title Pt and spouse will be independent with initial HEP for BLE stretching and strengthening. ALL STGS DUE 10/25/19    Baseline 10/22/19: met with current program with spouse assist    Time --   due to delay in scheduling   Status Achieved      PT SHORT TERM GOAL #2   Title Pt will perform lateral scoot transfer with min A for use of set up of slide board in order to decr caregiver burden.    Baseline performs with supervision    Status Achieved      PT SHORT TERM GOAL #3   Title Pt will perform standing in [place] with min/mod A for at least 60 seconds for functional weight bearing.    Baseline 10/22/19: met in session today    Time --    Period --    Status Achieved      PT SHORT TERM GOAL #4   Title Pt will verbalize understanding of performing pressure relief in order to decr risk of pressure sores.    Baseline pt reports performing pressure relief every 1-2 hrs, verbalizes understanding of performing every 20-30 minutes    Status Partially Met      PT SHORT TERM GOAL #5   Title Pt will perform all bed mobility with min guard/supervision in order to decr caregiver burden.    Baseline 10/22/19: pt reports doing this on his own at home, supine<>sit edge of bed and rolling    Status Achieved             PT Long Term Goals - 09/20/19 2138      PT LONG TERM GOAL #1   Title Pt and spouse will be independent with final HEP for BLE stretching and strengthening. ALL LTGS DUE 11/22/19    Time 9    Period Weeks    Status New    Target Date 11/22/19      PT LONG TERM GOAL #2   Title Pt will perform all bed mobility with mod I in order to decr caregiver burden.    Time 9    Period Weeks    Status New      PT LONG TERM GOAL #3   Title Pt will perform squat  pivot transfer from w/c <> mat table with supervision in order to demo improved functional mobility.    Time 9    Period Weeks    Status New  PT LONG TERM GOAL #4   Title Pt will perform sit <> stand with RW with mod A in order to demo improved functional mobility.    Time 9    Period Weeks    Status New                 Plan - 11/05/19 1104    Clinical Impression Statement Today's skilled session continued with use of Bioness to LE's concurrent with standing and ex's. Limited standing this session due to pt passing gas from abdominal muscle exertion and was concerned about having an incontinent bowel episode. The pt is making progress toward goals and should benefit from continued PT to progress toward unmet goals.    Personal Factors and Comorbidities Comorbidity 3+;Past/Current Experience    Comorbidities T8 paraplegia, diabetes, HTN    Examination-Activity Limitations Bed Mobility;Locomotion Level;Transfers;Stand    Examination-Participation Restrictions Community Activity;Yard Work    Merchant navy officer Evolving/Moderate complexity    PT Frequency 2x / week    PT Duration --   7 weeks   PT Treatment/Interventions Aquatic Therapy;Manual techniques;Therapeutic exercise;Gait training;Neuromuscular re-education;Orthotic Fit/Training    PT Next Visit Plan continue with Bioness to bil LE's with ex's, sit <> stands with RW, standing, pre-gait activities; continue to work on core strengthening- quadruped, tall kneeling when appropriate    PT Home Exercise Plan Access Code: [email or web address]    Consulted and Agree with Plan of Care Patient;Family member/caregiver    Family Member Consulted wife, [name]           Patient will benefit from skilled therapeutic intervention in order to improve the following deficits and impairments:  Decreased balance, Decreased coordination, Decreased range of motion, Decreased strength, Decreased mobility, Impaired sensation, Postural  dysfunction, Impaired tone, Decreased activity tolerance  Visit Diagnosis: Other symptoms and signs involving the nervous system  Muscle weakness (generalized)  Abnormal posture  Paraplegia, incomplete ([place])     Problem List Patient Active Problem List   Diagnosis Date Noted  . Spasticity 08/03/2019  . Paraplegia ([place]) 07/07/2019  . Neurogenic bowel 07/07/2019  . Neurogenic bladder 07/07/2019  . 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

## 2019-11-10 ENCOUNTER — Ambulatory Visit: Payer: No Typology Code available for payment source | Admitting: Physical Therapy

## 2019-11-10 ENCOUNTER — Other Ambulatory Visit: Payer: Self-pay

## 2019-11-10 DIAGNOSIS — R293 Abnormal posture: Secondary | ICD-10-CM

## 2019-11-10 DIAGNOSIS — M6281 Muscle weakness (generalized): Secondary | ICD-10-CM | POA: Diagnosis not present

## 2019-11-10 DIAGNOSIS — G8222 Paraplegia, incomplete: Secondary | ICD-10-CM

## 2019-11-10 DIAGNOSIS — R29818 Other symptoms and signs involving the nervous system: Secondary | ICD-10-CM

## 2019-11-10 NOTE — Therapy (Signed)
[facility] [address] [place], [name], [address] Phone: [phone]   Fax:  [phone]  Physical Therapy Treatment  Patient Details  Name: Timothy Davenport MRN: [id number] Date of Birth: [birth year] Referring Provider (PT): Juel Burrow    Encounter Date: 11/10/2019   PT End of Session - 11/10/19 1158    Visit Number 14    Number of Visits 16    Date for PT Re-Evaluation 01/26/20   15 visits in 120 days   Authorization Type VA    Authorization - Visit Number 20    Authorization - Number of Visits 15    Progress Note Due on Visit 20    PT Start Time 7893    PT Stop Time 1057    PT Time Calculation (min) 42 min    Equipment Utilized During Treatment Gait belt    Activity Tolerance Patient tolerated treatment well    Behavior During Therapy WFL for tasks assessed/performed           Past Medical History:  Diagnosis Date  . Colon polyp   . Diabetes mellitus without complication ([place])   . Diabetic neuropathy ([place])   . HTN (hypertension)   . Patella fracture   . Renal calculi     Past Surgical History:  Procedure Laterality Date  . LUMBAR LAMINECTOMY/DECOMPRESSION MICRODISCECTOMY N/A 07/01/2019   Procedure: LUMBAR LAMINECTOMY/DECOMPRESSION MICRODISCECTOMY 1 LEVEL, THORACIC SIX-SEVEN;  Surgeon: Consuella Lose, MD;  Location: [facility];  Service: Neurosurgery;  Laterality: N/A;  LUMBAR LAMINECTOMY/DECOMPRESSION MICRODISCECTOMY 1 LEVEL, THORACIC SIX-SEVEN   . ORIF PATELLA    . THORACIC DISCECTOMY N/A 07/03/2019   Procedure: Evacuation of Thoracic Hematoma;  Surgeon: Consuella Lose, MD;  Location: [facility];  Service: Neurosurgery;  Laterality: N/A;    There were no vitals filed for this visit.   Subjective Assessment - 11/10/19 1204    Subjective no changes.    Pertinent History T8 paraplegia ASIA B, diabetes, HTN    How long can you stand comfortably? reports he can stand for 30 seconds with a RW (with  HHPT)    Patient Stated Goals wants to get out of here walking.    Currently in Pain? No/denies                       11/10/19 1045  Transfers  Transfers Squat Pivot Transfers  Sit to Stand 4: Min guard  Sit to Stand Details Verbal cues for sequencing;Verbal cues for precautions/safety;Verbal cues for technique  Sit to Stand Details (indicate cue type and reason) from elevated mat table to [place], and then partial stand from [place] - heavy reliance on BUE  Stand to Sit 4: Min assist;With upper extremity assist;To bed  Stand to Sit Details (indicate cue type and reason) Verbal cues for sequencing;Verbal cues for technique;Verbal cues for safe use of DME/AE  Stand to Sit Details assist for controlled descent from [employer] Transfers 4: Min assist;4: Min guard (from w/c <> mat table)  Comments standing in [place]: 1 minute and 5 seconds, 1 minute and 3 seconds, partial sit <> stands from [place] 2 x 5 reps - therapist providing manual and verbal cues for full knee extension in standing and for sit <> stands, pt with heavy reliance on BUE when performing   Exercises  Exercises Other Exercises  Other Exercises  seated heel slides with leg on pillow case to decr friction with use of red tband resistance x10  reps B, therapist providing assist for pt to achieve full ROM            Access Code: [email or web address] URL: [email or web address] Date: 11/10/2019 Prepared by: Janann August  Bolded are new additions to HEP:   Program Notes buttock raise: off of hospital bed, having your son/wife there, pressing through arms and legs lifting your bottom off the bed, holding for ~2 seconds and then sitting back down. 2 x 5 reps, 1 -2x per day    Exercises Small Range Straight Leg Raise - 1 x daily - 5 x weekly - 1 sets - 10 reps Supine Heel Slide - 1 x daily - 5 x weekly - 1 sets - 10 reps Seated Hip Adduction Squeeze with Ball - 1 x daily - 5 x weekly - 1 sets - 10 reps  - 5 hold Seated Single Leg Hip Abduction with Resistance - 1 x daily - 5 x weekly - 1 sets - 10 reps -upgraded to use of red theraband Seated Heel Slide - 1 x daily - 5 x weekly - 1 sets - 10 reps Supine Short Arc Quad - 1 x daily - 5 x weekly - 5 sets - 2 reps - with family performing with active assist with RLE        PT Education - 11/10/19 1158    Education Details upgrades to pt's HEP    Person(s) Educated Patient    Methods Explanation;Demonstration;Handout    Comprehension Returned demonstration;Verbalized understanding            PT Short Term Goals - 10/22/19 1236      PT SHORT TERM GOAL #1   Title Pt and spouse will be independent with initial HEP for BLE stretching and strengthening. ALL STGS DUE 10/25/19    Baseline 10/22/19: met with current program with spouse assist    Time --   due to delay in scheduling   Status Achieved      PT SHORT TERM GOAL #2   Title Pt will perform lateral scoot transfer with min A for use of set up of slide board in order to decr caregiver burden.    Baseline performs with supervision    Status Achieved      PT SHORT TERM GOAL #3   Title Pt will perform standing in [place] with min/mod A for at least 60 seconds for functional weight bearing.    Baseline 10/22/19: met in session today    Time --    Period --    Status Achieved      PT SHORT TERM GOAL #4   Title Pt will verbalize understanding of performing pressure relief in order to decr risk of pressure sores.    Baseline pt reports performing pressure relief every 1-2 hrs, verbalizes understanding of performing every 20-30 minutes    Status Partially Met      PT SHORT TERM GOAL #5   Title Pt will perform all bed mobility with min guard/supervision in order to decr caregiver burden.    Baseline 10/22/19: pt reports doing this on his own at home, supine<>sit edge of bed and rolling    Status Achieved             PT Long Term Goals - 11/10/19 1159      PT LONG TERM GOAL #1   Title  Pt and spouse will be independent with final HEP for BLE stretching and strengthening. ALL LTGS DUE 11/22/19    Baseline upgraded and  revised final HEP on 11/10/19    Time 9    Period Weeks    Status Achieved      PT LONG TERM GOAL #2   Title Pt will perform all bed mobility with mod I in order to decr caregiver burden.    Time 9    Period Weeks    Status New      PT LONG TERM GOAL #3   Title Pt will perform squat pivot transfer from w/c <> mat table with supervision in order to demo improved functional mobility.    Baseline performed with min guard/min A on 11/10/19    Time 9    Period Weeks    Status Not Met      PT LONG TERM GOAL #4   Title Pt will perform sit <> stand with RW with mod A in order to demo improved functional mobility.    Time 9    Period Weeks    Status New               Plan - 11/10/19 1210    Clinical Impression Statement Began to check LTGs- upgraded and added strengthening exercises to pt's HEP, pt met LTG #1. Pt did not meet LTG #3 - is performing squat pivot transfers with min guard/min A. Remainder of session focused on standing tolerance and BLE strengthening without use of Bioness. Pt able to stand for a max of 1 minute and 5 seconds today in the [place] with verbal and manual cues for B knee extension in standing. Pt is progressing well, will check remaining LTGs at next session for re-cert. Currently waiting to hear back from [place] if pt is approved for 15 additional visits.    Personal Factors and Comorbidities Comorbidity 3+;Past/Current Experience    Comorbidities T8 paraplegia, diabetes, HTN    Examination-Activity Limitations Bed Mobility;Locomotion Level;Transfers;Stand    Examination-Participation Restrictions Community Activity;Yard Work    Merchant navy officer Evolving/Moderate complexity    PT Frequency 2x / week    PT Duration --   7 weeks   PT Treatment/Interventions Aquatic Therapy;Manual techniques;Therapeutic exercise;Gait  training;Neuromuscular re-education;Orthotic Fit/Training    PT Next Visit Plan check remaining LTGs - chloe to do re-cert. continue with Bioness to bil LE's with ex's, sit <> stands with RW, standing, pre-gait activities;    PT Home Exercise Plan Access Code: [email or web address]    Consulted and Agree with Plan of Care Patient;Family member/caregiver    Family Member Consulted wife, [name]           Patient will benefit from skilled therapeutic intervention in order to improve the following deficits and impairments:  Decreased balance, Decreased coordination, Decreased range of motion, Decreased strength, Decreased mobility, Impaired sensation, Postural dysfunction, Impaired tone, Decreased activity tolerance  Visit Diagnosis: Other symptoms and signs involving the nervous system  Muscle weakness (generalized)  Abnormal posture  Paraplegia, incomplete ([place])     Problem List Patient Active Problem List   Diagnosis Date Noted  . Spasticity 08/03/2019  . Paraplegia ([place]) 07/07/2019  . Neurogenic bowel 07/07/2019  . Neurogenic bladder 07/07/2019  . Thoracic myelopathy 06/30/2019    Arliss Journey, PT, DPT  11/10/2019, 12:11 PM  [facility] [address] [place], [name], [address] Phone: [phone]   Fax:  [phone]  Name: AADHAV UHLIG MRN: [id number] Date of Birth: 1950/02/29

## 2019-11-10 NOTE — Patient Instructions (Signed)
Access Code: [email or web address] URL: [email or web address] Date: 11/10/2019 Prepared by: Janann August  Program Notes buttock raise: off of hospital bed, having your son/wife there, pressing through arms and legs lifting your bottom off the bed, holding for ~2 seconds and then sitting back down. 2 x 5 reps, 1 -2x per day    Exercises Small Range Straight Leg Raise - 1 x daily - 5 x weekly - 1 sets - 10 reps Supine Heel Slide - 1 x daily - 5 x weekly - 1 sets - 10 reps Seated Hip Adduction Squeeze with Ball - 1 x daily - 5 x weekly - 1 sets - 10 reps - 5 hold Seated Single Leg Hip Abduction with Resistance - 1 x daily - 5 x weekly - 1 sets - 10 reps Seated Heel Slide - 1 x daily - 5 x weekly - 1 sets - 10 reps Supine Short Arc Quad - 1 x daily - 5 x weekly - 5 sets - 2 reps

## 2019-11-12 ENCOUNTER — Ambulatory Visit: Payer: No Typology Code available for payment source | Admitting: Physical Therapy

## 2019-11-18 ENCOUNTER — Encounter: Payer: Self-pay | Admitting: Physical Therapy

## 2019-11-18 ENCOUNTER — Ambulatory Visit: Payer: No Typology Code available for payment source | Admitting: Physical Therapy

## 2019-11-18 ENCOUNTER — Other Ambulatory Visit: Payer: Self-pay

## 2019-11-18 DIAGNOSIS — R293 Abnormal posture: Secondary | ICD-10-CM

## 2019-11-18 DIAGNOSIS — R29818 Other symptoms and signs involving the nervous system: Secondary | ICD-10-CM

## 2019-11-18 DIAGNOSIS — M6281 Muscle weakness (generalized): Secondary | ICD-10-CM

## 2019-11-19 NOTE — Therapy (Addendum)
[facility] [address] [place], [name], [address] Phone: [phone]   Fax:  [phone]  Physical Therapy Treatment/Re-Cert  Patient Details  Name: Timothy Davenport MRN: [id number] Date of Birth: 30-Jun-1950 Referring Provider (PT): Juel Burrow    Encounter Date: 11/18/2019   11/24/19 1624  PT Visits / Re-Eval  Visit Number 15  Number of Visits 30  Date for PT Re-Evaluation 01/26/20 (60 day POC - written in 7 day cert)  Authorization  [facility]ization Type [facility] - awaiting re auth  Authorization - Visit Number 15  Authorization - Number of Visits 15  Progress Note Due on Visit 20  PT Time Calculation  PT Start Time 1533  PT Stop Time 1615  PT Time Calculation (min) 42 min  PT - End of Session  Equipment Utilized During Treatment Gait belt  Activity Tolerance Patient tolerated treatment well  Behavior During Therapy WFL for tasks assessed/performed    Past Medical History:  Diagnosis Date   Colon polyp    Diabetes mellitus without complication ([place])    Diabetic neuropathy ([place])    HTN (hypertension)    Patella fracture    Renal calculi     Past Surgical History:  Procedure Laterality Date   LUMBAR LAMINECTOMY/DECOMPRESSION MICRODISCECTOMY N/A 07/01/2019   Procedure: LUMBAR LAMINECTOMY/DECOMPRESSION MICRODISCECTOMY 1 LEVEL, THORACIC SIX-SEVEN;  Surgeon: Consuella Lose, MD;  Location: [facility];  Service: Neurosurgery;  Laterality: N/A;  LUMBAR LAMINECTOMY/DECOMPRESSION MICRODISCECTOMY 1 LEVEL, THORACIC SIX-SEVEN    ORIF PATELLA     THORACIC DISCECTOMY N/A 07/03/2019   Procedure: Evacuation of Thoracic Hematoma;  Surgeon: Consuella Lose, MD;  Location: [facility];  Service: Neurosurgery;  Laterality: N/A;    There were no vitals filed for this visit.   Subjective Assessment - 11/18/19 1537    Subjective No new complaints. No falls or pain to report.    Pertinent History T8 paraplegia ASIA B,  diabetes, HTN    How long can you stand comfortably? reports he can stand for 30 seconds with a RW (with HHPT)    Patient Stated Goals wants to get out of here walking.    Currently in Pain? No/denies             11/24/19 0001  Assessment  Medical Diagnosis T8 paraplegia  Referring Provider (PT) Piva, Enrico,DO   Onset Date/Surgical Date 06/30/19  Prior Function  Level of Independence Independent;Independent with community mobility with device       [facility] Adult PT Treatment/Exercise - 11/18/19 1539      Bed Mobility   Rolling Right Other (comment);Supervision/verbal cueing    Rolling Left Supervision/Verbal cueing    Supine to Sit Supervision/Verbal cueing    Sit to Supine Supervision/Verbal cueing    Sit to Supine - Details (indicate cue type and reason) pt reports he has a hospital bed at home and with [facility] elevated with rail he is supervision for supine<>sit, and with HOB flat with rails supervision with rolling in bed. Spouse assists with clothing mangement only.       Transfers   Transfers Sit to Stand;Stand to Sit;Lateral/Scoot Transfers    Sit to Stand 4: Min assist;4: Min guard;From elevated surface;With upper extremity assist;From bed    Stand to Sit 4: Min assist;With upper extremity assist;To bed    Stand to Sit Details (indicate cue type and reason) Verbal cues for sequencing;Verbal cues for technique;Verbal cues for safe use of DME/AE    Stand to Sit Details assist for  controlled descent    Lateral/Scoot Transfers 6: Modified independent (Device/Increase time)    Lateral/Scoot Transfer Details (indicate cue type and reason) from wheelchair to/from mat table with no slide board used today     Comments standing mat<>RW: 1 min 24.06 sec's with 1st stand, 2cd stand 2 min 00.28 sec's- both of these working on standing tolerance with emphasis on hip extension, knees blocked, tall posture and decreased UE reliance on walker; remainder of stands (bloc practice)-worked on  lateral weight shifting progressing to pregait. See pregait section for details        Ambulation/Gait   Ambulation/Gait Yes    Ambulation/Gait Assistance 3: Mod assist   2 person plus w/c follow   Ambulation/Gait Assistance Details PTA seated on stool in front of pt with second therapist to side for balance assist and tech bringing wheelchair. assist/cues for weight shifting, hip extension and  right LE advancement/step placement. cues/facilitaiton for upright trunk posture with heavy UE reliance on bars.     Ambulation Distance (Feet) 6 Feet    Assistive device Parallel bars    Gait Pattern Step-to pattern;Decreased step length - right;Decreased step length - left;Decreased hip/knee flexion - right;Decreased hip/knee flexion - left;Right flexed knee in stance;Left flexed knee in stance;Trunk flexed;Narrow base of support   posterior lean with bil hip flexion as well   Pre-Gait Activities standing at RW with min assist: with one knee blocked worked on [place] with other leg. pt able to self move left LE with step placemetn guidance needed, min assist with right LE with afo on. progressed to alternaing LE fwd/bwd steppign wtih stnace knee blocked.                PT Short Term Goals - 10/22/19 1236      PT SHORT TERM GOAL #1   Title Pt and spouse will be independent with initial HEP for BLE stretching and strengthening. ALL STGS DUE 10/25/19    Baseline 10/22/19: met with current program with spouse assist    Time --   due to delay in scheduling   Status Achieved      PT SHORT TERM GOAL #2   Title Pt will perform lateral scoot transfer with min A for use of set up of slide board in order to decr caregiver burden.    Baseline performs with supervision    Status Achieved      PT SHORT TERM GOAL #3   Title Pt will perform standing in [place] with min/mod A for at least 60 seconds for functional weight bearing.    Baseline 10/22/19: met in session today    Time --    Period --    Status  Achieved      PT SHORT TERM GOAL #4   Title Pt will verbalize understanding of performing pressure relief in order to decr risk of pressure sores.    Baseline pt reports performing pressure relief every 1-2 hrs, verbalizes understanding of performing every 20-30 minutes    Status Partially Met      PT SHORT TERM GOAL #5   Title Pt will perform all bed mobility with min guard/supervision in order to decr caregiver burden.    Baseline 10/22/19: pt reports doing this on his own at home, supine<>sit edge of bed and rolling    Status Achieved          Revised STGs for re-cert:   PT Short Term Goals - 11/24/19 1643  PT SHORT TERM GOAL #1   Title Pt will perform squat pivot transfer from w/c <> mat table in order to decr caregiver burden. ALL STGS DUE 01/05/20    Time 6   due to delay in scheduling   Period Weeks    Status New    Target Date 01/05/20      PT SHORT TERM GOAL #2   Title Pt will perform sit <> stand with min A from standard height mat table surface in order to demo improved BLE strength.    Baseline min A from elevated mat table.    Time 6    Period Weeks    Status New      PT SHORT TERM GOAL #3   Title Pt will perform standing in RW for 3 minutes with min A in order to demo improved functional weihgt bearing.    Baseline 2 minutes and .28 seconds    Time 6    Period Weeks    Status New      PT SHORT TERM GOAL #4   Title Pt will ambulate at least 10' in the // bars with min A x2 in order to demo improved functional mobility.    Time 6    Period Weeks    Status New              PT Long Term Goals - 11/18/19 1538      PT LONG TERM GOAL #1   Title Pt and spouse will be independent with final HEP for BLE stretching and strengthening. ALL LTGS DUE 11/22/19    Baseline upgraded and revised final HEP on 11/10/19    Status Achieved      PT LONG TERM GOAL #2   Title Pt will perform all bed mobility with mod I in order to decr caregiver burden.    Baseline  11/18/19: reports spouse continues to assist with clothing management, pt reports doing all mobility with hospital bed features    Time --    Period --    Status Partially Met      PT LONG TERM GOAL #3   Title Pt will perform squat pivot transfer from w/c <> mat table with supervision in order to demo improved functional mobility.    Baseline performed with min guard/min A on 11/10/19; supervision with lateral transfers with no slide board needed    Time --    Period --    Status Partially Met      PT LONG TERM GOAL #4   Title Pt will perform sit <> stand with RW with mod A in order to demo improved functional mobility.    Baseline 11/18/19: met in session today    Time --    Period --    Status Achieved          Revised LTGs for re-cert:  PT Long Term Goals - 11/24/19 1647      PT LONG TERM GOAL #1   Title Pt and spouse will be independent with final HEP for BLE stretching and strengthening. ALL LTGS DUE 02/02/20    Baseline upgraded and revised final HEP on 11/10/19    Time 10   due to delay in scheduling   Period Weeks    Status On-going    Target Date 02/02/20      PT LONG TERM GOAL #2   Title Pt will tolerate static/dynamic standing activities with min guard in RW for at least 4 minutes in  order to demo improved standing tolerance.    Time 10    Period Weeks    Status New      PT LONG TERM GOAL #3   Title Pt will perform squat pivot transfer from w/c <> mat table with mod I in order to demo decr caregiver burden.    Baseline performed with min guard/min A on 11/10/19; supervision with lateral transfers with no slide board needed    Time 10    Period Weeks    Status Revised      PT LONG TERM GOAL #4   Title Pt will perform sit <> stand with RW with min guard in order to demo improved functional mobility.    Time 10    Period Weeks    Status Revised      PT LONG TERM GOAL #5   Title Pt will ambulate at least 10' with RW with mod A in order to demo improved  functional mobility.    Time 10    Period Weeks    Status New                 11/18/19 1538  Plan  Clinical Impression Statement Today's skilled session focused on progress toward remaining LTGs with goals met. Pt continues to need assist with squat pivot tranfers, however is at a supervision to Mod I level with scoot/lateral transfers without a slide board. Remainder of session focused on standing balance/posture, pre-gait at RW, progressing to gait in parallel bars. Able to attempt gait today in // bars with mod A x2 and a w/c follow. Pt able to ambulate 6 ft. The pt is making steady progress and should benefit from continued PT to progress toward unmet goals. Will send re-auth to [facility] for 15 additional visits and pt will continue to benefit from skilled PT in order to improve functional mobility and independence- will re-cert for an additional 1-2x a week for 7-8 weeks.  Personal Factors and Comorbidities Comorbidity 3+;Past/Current Experience  Comorbidities T8 paraplegia, diabetes, HTN  Examination-Activity Limitations Bed Mobility;Locomotion Level;Transfers;Stand  Examination-Participation Restrictions Community Activity;Yard Work  Pt will benefit from skilled therapeutic intervention in order to improve on the following deficits Decreased balance;Decreased coordination;Decreased range of motion;Decreased strength;Decreased mobility;Impaired sensation;Postural dysfunction;Impaired tone;Decreased activity tolerance  Stability/Clinical Decision Making Evolving/Moderate complexity  PT Frequency 2x / week (1-2x)  PT Duration  (7-8 weeks)  PT Treatment/Interventions Aquatic Therapy;Manual techniques;Therapeutic exercise;Gait training;Neuromuscular re-education;Orthotic Fit/Training  PT Next Visit Plan continue with [place] to bil LE's with ex's, sit <> stands with RW, standing, pre-gait activities; gait in parallel bars when have assist  PT Home Exercise Plan Access Code: [email or web address]   Consulted and Agree with Plan of Care Patient;Family member/caregiver  Family Member Consulted wife, [name]     Patient will benefit from skilled therapeutic intervention in order to improve the following deficits and impairments:  Decreased balance, Decreased coordination, Decreased range of motion, Decreased strength, Decreased mobility, Impaired sensation, Postural dysfunction, Impaired tone, Decreased activity tolerance  Visit Diagnosis: Other symptoms and signs involving the nervous system  Muscle weakness (generalized)  Abnormal posture     Problem List Patient Active Problem List   Diagnosis Date Noted   Spasticity 08/03/2019   Paraplegia ([place]) 07/07/2019   Neurogenic bowel 07/07/2019   Neurogenic bladder 07/07/2019   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

## 2019-11-24 NOTE — Addendum Note (Signed)
Addended by: Arliss Journey on: 11/24/2019 04:50 PM   Modules accepted: Orders

## 2019-11-25 ENCOUNTER — Ambulatory Visit: Payer: BC Managed Care – PPO | Admitting: Physical Medicine and Rehabilitation

## 2019-11-27 ENCOUNTER — Encounter: Payer: Self-pay | Admitting: Physical Medicine and Rehabilitation

## 2019-11-27 ENCOUNTER — Encounter
Payer: BC Managed Care – PPO | Attending: Physical Medicine and Rehabilitation | Admitting: Physical Medicine and Rehabilitation

## 2019-11-27 ENCOUNTER — Other Ambulatory Visit: Payer: Self-pay

## 2019-11-27 VITALS — BP 120/78 | HR 76 | Temp 98.1°F | Ht 71.0 in | Wt 170.0 lb

## 2019-11-27 DIAGNOSIS — R252 Cramp and spasm: Secondary | ICD-10-CM | POA: Diagnosis not present

## 2019-11-27 DIAGNOSIS — G8220 Paraplegia, unspecified: Secondary | ICD-10-CM | POA: Diagnosis not present

## 2019-11-27 DIAGNOSIS — N319 Neuromuscular dysfunction of bladder, unspecified: Secondary | ICD-10-CM | POA: Diagnosis not present

## 2019-11-27 DIAGNOSIS — Z993 Dependence on wheelchair: Secondary | ICD-10-CM | POA: Diagnosis not present

## 2019-11-27 NOTE — Patient Instructions (Signed)
Patient is a 69 yr old male with T8 ASIA B paraplegia- neurogenic bowel and bladder and spasticity here for f/u.     [facility] [place] have Dealer at [employer]. However not sure if it's a Neuro-Urologist.   2. Will place consult to Neuro-Urology-  Dr Matilde Sprang- likely needs a Urodynamics study for spastic/neurogenic bladder.   3 Can reduce Baclofen to 5 mg 3x/day- to see if can reduce dose- x 2 weeks, then if possible, can try 5 mg 2x/day x 2 weeks, then can back up to as needed if you evaluate and it's better still- will take at most 48 hours to know if lower dose is appropriate. Baclofen treats TIGHTNESS AND SPASMS- if legs get more tight, then go back to previous dose.   4. Gets most of meds via [place] now, so doesn't need refills.    5. F/U in 3 months.

## 2019-11-27 NOTE — Progress Notes (Signed)
Subjective:    Patient ID: Timothy Davenport, male    DOB: 12-09-1950, 69 y.o.   MRN: [id number]  HPI Patient is a 69 yr old male with T8 ASIA B paraplegia- neurogenic bowel and bladder and spasticity here for f/u.     New program in [place]- accepted into this program.  Sounds like [facility] program- 1-3 weeks-   Not using tramadol much- 1/2 pill 3 weeks ago-  Kind of leaves him lethargic- so doesn't use much.   Bowel program- got some pointers to help with bowel program-  Learned new things with bowel program-  And got potty chair Empties bowels much easier and quicker- might last 20 minutes now after suppository-  And only goes 1x nightly now.   Wearing w/c gloves. Got from [place].  Best move going to [place].   After caths self- in the next 30-60 minutes- has the urge to void- before has gone back to cath, has already has void/urinary incontinence.    Spasms are 90% gone- Baclofen 10 mg TID  Pain Inventory Average Pain 1 Pain Right Now 1 My pain is intermittent and sharp  LOCATION OF PAIN  Arms, elbow & shoulders  BOWEL Number of stools per week: 5 Oral laxative use No  Type of laxative PILL Enema or suppository use Yes  History of colostomy No  Incontinent Yes   BLADDER Normal In and out cath, frequency -Every 4 hours Able to self cath Yes  Bladder incontinence Yes  Frequent urination No  Leakage with coughing No  Difficulty starting stream Yes  Incomplete bladder emptying Yes    Mobility ability to climb steps?  no do you drive?  no use a wheelchair Do you have any goals in this area?  yes  Function retired I need assistance with the following:  dressing, toileting, meal prep and household duties Do you have any goals in this area?  yes  Neuro/Psych bladder control problems bowel control problems  Prior Studies x-rays In [place]  Physicians involved in your care Any changes since last visit?  yes [facility] [place]    Family History  Problem Relation  Age of Onset  . Hypertension Mother   . Hypertension Father    Social History   Socioeconomic History  . Marital status: Married    Spouse name: Not on file  . Number of children: Not on file  . Years of education: Not on file  . Highest education level: Not on file  Occupational History  . Not on file  Tobacco Use  . Smoking status: Never Smoker  . Smokeless tobacco: Never Used  Vaping Use  . Vaping Use: Never used  Substance and Sexual Activity  . Alcohol use: Yes    Comment: 2-3 drinks week  . Drug use: Yes    Types: Marijuana    Comment: occasionally  . Sexual activity: Not Currently  Other Topics Concern  . Not on file  Social History Narrative  . Not on file   Social Determinants of Health   Financial Resource Strain:   . Difficulty of Paying Living Expenses: Not on file  Food Insecurity:   . Worried About Charity fundraiser in the Last Year: Not on file  . Ran Out of Food in the Last Year: Not on file  Transportation Needs:   . Lack of Transportation (Medical): Not on file  . Lack of Transportation (Non-Medical): Not on file  Physical Activity:   . Days of Exercise  per Week: Not on file  . Minutes of Exercise per Session: Not on file  Stress:   . Feeling of Stress : Not on file  Social Connections:   . Frequency of Communication with Friends and Family: Not on file  . Frequency of Social Gatherings with Friends and Family: Not on file  . Attends Religious Services: Not on file  . Active Member of Clubs or Organizations: Not on file  . Attends Archivist Meetings: Not on file  . Marital Status: Not on file   Past Surgical History:  Procedure Laterality Date  . LUMBAR LAMINECTOMY/DECOMPRESSION MICRODISCECTOMY N/A 07/01/2019   Procedure: LUMBAR LAMINECTOMY/DECOMPRESSION MICRODISCECTOMY 1 LEVEL, THORACIC SIX-SEVEN;  Surgeon: Consuella Lose, MD;  Location: [facility];  Service: Neurosurgery;  Laterality: N/A;  LUMBAR LAMINECTOMY/DECOMPRESSION  MICRODISCECTOMY 1 LEVEL, THORACIC SIX-SEVEN   . ORIF PATELLA    . THORACIC DISCECTOMY N/A 07/03/2019   Procedure: Evacuation of Thoracic Hematoma;  Surgeon: Consuella Lose, MD;  Location: [facility];  Service: Neurosurgery;  Laterality: N/A;   Past Medical History:  Diagnosis Date  . Colon polyp   . Diabetes mellitus without complication ([place])   . Diabetic neuropathy ([place])   . HTN (hypertension)   . Patella fracture   . Renal calculi    BP 120/78   Pulse 76   Temp 98.1 F (36.7 C)   Ht 5\' 11"  (1.803 m)   Wt 170 lb (77.1 kg)   BMI 23.71 kg/m   Opioid Risk Score:   Fall Risk Score:  `1  Depression screen PHQ 2/9  Depression screen PHQ 2/9 08/03/2019  Decreased Interest 3  Down, Depressed, Hopeless 0  PHQ - 2 Score 3  Altered sleeping 0  Tired, decreased energy 0  Change in appetite 1  Feeling bad or failure about yourself  0  Trouble concentrating 0  Moving slowly or fidgety/restless 0  Suicidal thoughts 0  PHQ-9 Score 4  Difficult doing work/chores Not difficult at all   Review of Systems  Constitutional: Negative.   HENT: Negative.   Eyes: Negative.   Respiratory: Negative.   Cardiovascular: Negative.   Gastrointestinal: Negative.   Endocrine: Negative.   Genitourinary: Negative.   Musculoskeletal: Positive for gait problem.       Shoulder & arms  Skin: Negative.   Allergic/Immunologic: Negative.   Hematological: Negative.   Psychiatric/Behavioral: Negative.   All other systems reviewed and are negative.      Objective:   Physical Exam  Awake, alert, appropriate-  in manual w/c, has on w/c gloves, NAD       Assessment & Plan:  Patient is a 69 yr old male with T8 ASIA B paraplegia- neurogenic bowel and bladder and spasticity here for f/u.     [facility]. [facility] [place]- they have Dealer at [employer]. However not sure if it's a Neuro-Urologist.   2. Will place consult to Neuro-Urology-  Dr Matilde Sprang- likely needs a Urodynamics study for  spastic/neurogenic bladder.   3 Can reduce Baclofen to 5 mg 3x/day- to see if can reduce dose- x 2 weeks, then if possible, can try 5 mg 2x/day x 2 weeks, then can back up to as needed if you evaluate and it's better still- will take at most 48 hours to know if lower dose is appropriate. Baclofen treats TIGHTNESS AND SPASMS- if legs get more tight, then go back to previous dose.   4. Gets most of meds via [place] now, so doesn't need refills.  5. F/U in 3 months.   I spent a total of 35 minutes on visit- as detailed above.

## 2019-11-30 ENCOUNTER — Ambulatory Visit: Payer: No Typology Code available for payment source | Admitting: Physical Therapy

## 2019-12-04 ENCOUNTER — Ambulatory Visit: Payer: BC Managed Care – PPO | Admitting: Physical Therapy

## 2019-12-09 ENCOUNTER — Ambulatory Visit: Payer: BC Managed Care – PPO | Admitting: Gastroenterology

## 2019-12-09 ENCOUNTER — Ambulatory Visit: Payer: BC Managed Care – PPO | Admitting: Physical Therapy

## 2019-12-11 ENCOUNTER — Ambulatory Visit: Payer: BC Managed Care – PPO | Admitting: Physical Therapy

## 2019-12-14 ENCOUNTER — Ambulatory Visit: Payer: BC Managed Care – PPO | Admitting: Physical Therapy

## 2019-12-16 ENCOUNTER — Ambulatory Visit: Payer: BC Managed Care – PPO | Admitting: Physical Therapy

## 2019-12-21 ENCOUNTER — Ambulatory Visit: Payer: BC Managed Care – PPO | Admitting: Physical Therapy

## 2019-12-23 ENCOUNTER — Ambulatory Visit: Payer: BC Managed Care – PPO | Admitting: Physical Therapy

## 2019-12-28 ENCOUNTER — Ambulatory Visit: Payer: BC Managed Care – PPO | Admitting: Physical Therapy

## 2019-12-30 ENCOUNTER — Ambulatory Visit: Payer: BC Managed Care – PPO | Admitting: Physical Therapy

## 2020-01-04 ENCOUNTER — Ambulatory Visit: Payer: BC Managed Care – PPO | Admitting: Physical Therapy

## 2020-01-06 ENCOUNTER — Ambulatory Visit: Payer: BC Managed Care – PPO | Admitting: Physical Therapy

## 2020-01-11 ENCOUNTER — Ambulatory Visit: Payer: BC Managed Care – PPO | Admitting: Physical Therapy

## 2020-01-13 ENCOUNTER — Ambulatory Visit: Payer: BC Managed Care – PPO | Admitting: Physical Therapy

## 2020-01-20 ENCOUNTER — Telehealth: Payer: Self-pay | Admitting: *Deleted

## 2020-01-20 DIAGNOSIS — G8220 Paraplegia, unspecified: Secondary | ICD-10-CM

## 2020-01-20 NOTE — Telephone Encounter (Signed)
Patient left a message stating he has completed the program down in [place], [place] and would like a referral to [facility] outpatient rehabilitation

## 2020-01-21 NOTE — Telephone Encounter (Signed)
Have ordered outpt PT for pt- if he did the [facility], he likely doesn't need more OT.   Can you let pt know I placed the orders.

## 2020-01-21 NOTE — Telephone Encounter (Signed)
Pt notified that [facility] will be calling for PT

## 2020-01-26 ENCOUNTER — Ambulatory Visit: Payer: BC Managed Care – PPO

## 2020-02-17 ENCOUNTER — Ambulatory Visit: Payer: BC Managed Care – PPO | Attending: Rehabilitation | Admitting: Physical Therapy

## 2020-02-17 ENCOUNTER — Ambulatory Visit: Payer: BC Managed Care – PPO | Admitting: Physical Therapy

## 2020-02-22 ENCOUNTER — Ambulatory Visit: Payer: No Typology Code available for payment source | Attending: Family Medicine | Admitting: Physical Therapy

## 2020-02-22 ENCOUNTER — Encounter: Payer: Self-pay | Admitting: Physical Therapy

## 2020-02-22 ENCOUNTER — Other Ambulatory Visit: Payer: Self-pay

## 2020-02-22 DIAGNOSIS — M6281 Muscle weakness (generalized): Secondary | ICD-10-CM | POA: Insufficient documentation

## 2020-02-22 DIAGNOSIS — R293 Abnormal posture: Secondary | ICD-10-CM | POA: Diagnosis present

## 2020-02-22 DIAGNOSIS — R2681 Unsteadiness on feet: Secondary | ICD-10-CM | POA: Diagnosis present

## 2020-02-22 DIAGNOSIS — G8222 Paraplegia, incomplete: Secondary | ICD-10-CM | POA: Insufficient documentation

## 2020-02-22 DIAGNOSIS — R262 Difficulty in walking, not elsewhere classified: Secondary | ICD-10-CM | POA: Insufficient documentation

## 2020-02-22 DIAGNOSIS — R29818 Other symptoms and signs involving the nervous system: Secondary | ICD-10-CM | POA: Insufficient documentation

## 2020-02-22 NOTE — Therapy (Signed)
[facility] [place], [name], [address] Phone: [phone]   Fax:  [phone]  Physical Therapy Evaluation  Patient Details  Name: Timothy Davenport MRN: [id number] Date of Birth: 1950/12/21 Referring Provider (PT): Juel Burrow    Encounter Date: 02/22/2020   PT End of Session - 02/22/20 1541    Visit Number 1    Number of Visits 17    Date for PT Re-Evaluation 05/22/20   60 day POC, 90 day cert   Authorization Type VA - 15 visits 02/05/20 - 06/04/20    Authorization - Number of Visits 15    Progress Note Due on Visit 10    PT Start Time [id number]    PT Stop Time 1530    PT Time Calculation (min) 43 min    Equipment Utilized During Treatment Gait belt    Activity Tolerance Patient tolerated treatment well    Behavior During Therapy WFL for tasks assessed/performed           Past Medical History:  Diagnosis Date  . Colon polyp   . Diabetes mellitus without complication ([place])   . Diabetic neuropathy ([place])   . HTN (hypertension)   . Patella fracture   . Renal calculi     Past Surgical History:  Procedure Laterality Date  . LUMBAR LAMINECTOMY/DECOMPRESSION MICRODISCECTOMY N/A 07/01/2019   Procedure: LUMBAR LAMINECTOMY/DECOMPRESSION MICRODISCECTOMY 1 LEVEL, THORACIC SIX-SEVEN;  Surgeon: Consuella Lose, MD;  Location: [facility];  Service: Neurosurgery;  Laterality: N/A;  LUMBAR LAMINECTOMY/DECOMPRESSION MICRODISCECTOMY 1 LEVEL, THORACIC SIX-SEVEN   . ORIF PATELLA    . THORACIC DISCECTOMY N/A 07/03/2019   Procedure: Evacuation of Thoracic Hematoma;  Surgeon: Consuella Lose, MD;  Location: [facility];  Service: Neurosurgery;  Laterality: N/A;    There were no vitals filed for this visit.    Subjective Assessment - 02/22/20 1452    Subjective Went to a SCI unit [facility] from 11/29/19 - 01/12/20. Worked on walking (with a walker) - was able to do 68' with an exoskeleton and an overground  harness (states he was doing 50% at the end of the program). Did a lot of sit <> stand exercises and balance with decr UE support with the RW. Has been walking at home with his son with the RW.  Learned how to cath himself. Also had OT and recreational therapy.    Pertinent History T8 paraplegia, diabetes, HTN    How long can you stand comfortably? probably a couple minutes with RW.    How long can you walk comfortably? approx. 5-10 minutes with his son and RW.    Patient Stated Goals wants to be able to walk without any assistance    Currently in Pain? No/denies              [facility] PT Assessment - 02/22/20 1459      Assessment   Medical Diagnosis T8 paraplegia    Referring Provider (PT) Piva, Enrico,DO     Onset Date/Surgical Date 06/30/19    Hand Dominance Right    Prior Therapy previous PT at this location and extensive multi-disciplinary therapy down in [place] for SCI      Precautions   Precautions Fall      Balance Screen   Has the patient fallen in the past 6 months Yes    How many times? 1   during a walk, fell forwards, was about 4' from his bed     Home  [place] residence    Living Arrangements Spouse/significant other    Available Help at Discharge Family    Type of [place] - manual;Walker - standard;Grab bars - toilet;Grab bars - tub/shower;Hospital bed;Shower seat;Hand held shower head    Additional Comments has a home health aide coming over 15 hours a week      Prior Function   Level of Independence Independent    Vocation Retired    Vocation Requirements [place] veteran    Leisure loves gardening (plant flowers and vegetable garden) and fishing      Cognition   Overall Cognitive Status Within Functional Limits for tasks assessed      Sensation   Light Touch Appears Intact    Proprioception Appears Intact   B ankles     Coordination    Gross Motor Movements are Fluid and Coordinated No    Heel Shin Test unable to perform due to weakness from paraplegia      Posture/Postural Control   Posture/Postural Control Postural limitations    Postural Limitations Posterior pelvic tilt;Rounded Shoulders      ROM / Strength   AROM / PROM / Strength Strength      Strength   Right/Left Hip Right;Left    Right Hip Flexion 2/5    Left Hip Flexion 3-/5    Right Knee Flexion 2/5    Right Knee Extension 3-/5    Left Knee Flexion 3+/5    Left Knee Extension 4-/5    Right Ankle Dorsiflexion --   unable to assess formally due to pt wearing AFO   Right Ankle Plantar Flexion --   unable to assess formally due to pt wearing AFO   Left Ankle Dorsiflexion 4/5    Left Ankle Plantar Flexion 2+/5      Transfers   Transfers Sit to Stand;Stand to Sit;Lateral/Scoot Transfers    Sit to Stand 4: Min assist    Sit to Stand Details (indicate cue type and reason) from elevated mat table x3 reps total, pt performs with B hands on RW, needing assist from therapist to steady RW. during one attempt pt unable to fully come to stand    Stand to Sit 4: Min assist;4: Min guard    Stand to Sit Details (indicate cue type and reason) Verbal cues for sequencing;Verbal cues for technique;Verbal cues for safe use of DME/AE    Stand to Sit Details assist for controlled descent, after bout of gait pt needing one episode of mod A to sit back in wheelchair as pt did not reach back from [employer] Transfers 5: Supervision    Squat Pivot Transfer Details (indicate cue type and reason) from w/c > mat table    Comments at edge of mat table: standing with RW for 2 minutes (pt had potential to stand longer, did not perform due to time constraints), with min guard - pt with incr B knee flexion as time went on. performed standing balance without UE support at RW holding for 5-10 seconds with close min guard      Ambulation/Gait   Ambulation/Gait Yes     Ambulation/Gait Assistance 4: Min assist   2nd person providing CGA for safety   Ambulation/Gait Assistance Details w/c follow and 2nd person providing CGA, cues for wider BOS and needing intermittent assist fro RW steering when  going around turns    Ambulation Distance (Feet) 41 Feet    Assistive device Rolling walker    Gait Pattern Step-to pattern;Decreased weight shift to right;Left flexed knee in stance;Right flexed knee in stance;Narrow base of support;Decreased step length - right;Decreased step length - left;Decreased stance time - right;Decreased hip/knee flexion - right    Ambulation Surface Level;Indoor    Gait Comments pt wearing AFO on RLE                      Objective measurements completed on examination: See above findings.               PT Education - 02/22/20 1541    Education Details clinical findings, POC    Person(s) Educated Patient    Methods Explanation    Comprehension Verbalized understanding            PT Short Term Goals - 02/22/20 1559      PT SHORT TERM GOAL #1   Title Pt will be independent with initial HEP in order to build upon functional gains made in therapy. ALL STGS DUE 03/21/20    Time 4    Period Weeks    Status New    Target Date 03/21/20      PT SHORT TERM GOAL #2   Title Pt will perform sit <> stand with min A from standard height mat table surface in order to demo improved BLE strength and improved transfer ability.    Baseline min A from elevated mat table.    Time 4    Period Weeks    Status New      PT SHORT TERM GOAL #3   Title Pt will perform standing in RW for 3 minutes with supervision in order to demo improved functional weight bearing.    Baseline 2 minutes with min guard    Time 4    Period Weeks    Status New      PT SHORT TERM GOAL #4   Title Pt will ambulate at least 6' with min A in order to demo improved household mobility.    Baseline 46' with min A    Time 4    Period Weeks    Status  New             PT Long Term Goals - 02/22/20 1602      PT LONG TERM GOAL #1   Title Pt and spouse will be independent with final HEP for BLE stretching and strengthening. ALL LTGS DUE 04/18/20    Time 8    Period Weeks    Status New    Target Date 04/18/20      PT LONG TERM GOAL #2   Title Pt will tolerate static/dynamic standing activities with min guard in RW for at least 4 minutes in order to demo improved standing tolerance.    Time 8    Period Weeks    Status New      PT LONG TERM GOAL #3   Title Pt will perform stand pivot transfer with RW from w/c <> mat table with min A .    Baseline not yet assessed    Time 8    Period Weeks    Status New      PT LONG TERM GOAL #4   Title Pt will perform sit <> stand with RW with min guard in order to demo improved functional mobility.    Baseline  currently needing min A from elevated mat table    Time 8    Period Weeks    Status New      PT LONG TERM GOAL #5   Title Pt will ambulate at least 115' with RW with min guard in order to demo improved functional mobility.    Baseline 49' with min A    Time 8    Period Weeks    Status New                  Plan - 02/22/20 1552    Clinical Impression Statement Patient is a 70 year old male referred to Neuro OPPT for T8 incomplete paraplegia. Was admitted to hospital on 06/30/19 after a fall with inability to move LLE and worsening of RLE weakness - was found to have severe cord compression. Received PT at this location from 09/17/19 - 11/18/19. Pt then went down to an extensive SCI program in [place], [place] through the [place] for approx. 6 months. Pt is now able to walk with a walker with min A and close w/c follow (previously at end of previous bout of therapy was approx. 6' in the parallel bars with mod A). Pt needs min A for sit <> stand transfers for controlled descent and to help steady RW.  Pt's PMH is significant for: diabetes, HTN. The following deficits were present during the  exam:  decreased BLE strength, gait abnormalities, decr endurance, decr BLE ROM, postural abnormalities, imbalance, impaired coordination. Pt would benefit from skilled PT to address these impairments and functional limitations to maximize functional mobility independence    Personal Factors and Comorbidities Comorbidity 3+;Past/Current Experience    Comorbidities T8 paraplegia, diabetes, HTN    Examination-Activity Limitations Locomotion Level;Transfers;Stand    Examination-Participation Restrictions Community Activity;Yard Work   Estate manager/land agent Evolving/Moderate complexity    Clinical Decision Making Moderate    Rehab Potential Good    PT Frequency 2x / week   1-2x   PT Duration 8 weeks   7-8 weeks   PT Treatment/Interventions Aquatic Therapy;Manual techniques;Therapeutic exercise;Gait training;Neuromuscular re-education;Orthotic Fit/Training    PT Next Visit Plan review pt's most recent HEP for BLE (from [facility] program in [place]). gait training with RW (needs close w/c follow). sit <> stands, pre gait activities.    PT Home Exercise Plan Access Code: [email or web address]    Consulted and Agree with Plan of Care Patient           Patient will benefit from skilled therapeutic intervention in order to improve the following deficits and impairments:  Decreased balance,Decreased coordination,Decreased range of motion,Decreased strength,Decreased mobility,Postural dysfunction,Impaired tone,Decreased activity tolerance,Abnormal gait,Decreased endurance,Difficulty walking,Impaired sensation  Visit Diagnosis: Other symptoms and signs involving the nervous system  Muscle weakness (generalized)  Paraplegia, incomplete (HCC)  Difficulty in walking, not elsewhere classified  Unsteadiness on feet     Problem List Patient Active Problem List   Diagnosis Date Noted  . 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 Spasticity 08/03/2019  . Paraplegia (HCC) 07/07/2019  . Neurogenic  bowel 07/07/2019  . Neurogenic bladder 07/07/2019  . 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 [place], [place], [address] Phone: [phone]   Fax:  [phone]  Name: Timothy Davenport MRN: [id number] Date of Birth: February 01, 1951

## 2020-02-22 NOTE — Addendum Note (Signed)
Addended by: Drake Leach on: 02/22/2020 04:05 PM   Modules accepted: Orders

## 2020-02-25 ENCOUNTER — Ambulatory Visit: Payer: No Typology Code available for payment source | Admitting: Physical Therapy

## 2020-02-25 ENCOUNTER — Other Ambulatory Visit: Payer: Self-pay

## 2020-02-25 ENCOUNTER — Encounter: Payer: Self-pay | Admitting: Physical Therapy

## 2020-02-25 DIAGNOSIS — R29818 Other symptoms and signs involving the nervous system: Secondary | ICD-10-CM

## 2020-02-25 DIAGNOSIS — R262 Difficulty in walking, not elsewhere classified: Secondary | ICD-10-CM

## 2020-02-25 DIAGNOSIS — G8222 Paraplegia, incomplete: Secondary | ICD-10-CM

## 2020-02-25 DIAGNOSIS — R2681 Unsteadiness on feet: Secondary | ICD-10-CM

## 2020-02-25 DIAGNOSIS — M6281 Muscle weakness (generalized): Secondary | ICD-10-CM

## 2020-02-25 DIAGNOSIS — R293 Abnormal posture: Secondary | ICD-10-CM

## 2020-02-25 NOTE — Patient Instructions (Signed)
Access Code: [email or web address] URL: [email or web address] Date: 02/25/2020 Prepared by: Sallyanne Kuster  Program Notes buttock raise: off of hospital bed, having your son/wife there, pressing through arms and legs lifting your bottom off the bed, holding for ~2 seconds and then sitting back down. 2 x 5 reps, 1 -2x per day    Exercises Modified Thomas Stretch - 1 x daily - 5 x weekly - 1 sets - 10 reps Seated Single Leg Hip Abduction with Resistance - 1 x daily - 5 x weekly - 1 sets - 10 reps Seated Heel Slide - 1 x daily - 5 x weekly - 1 sets - 10 reps Seated Hamstring Curl with Anchored Resistance - 1 x daily - 5 x weekly - 1 sets - 10 reps Standing March with Counter Support - 1 x daily - 5 x weekly - 1 sets - 10 reps Wide Stance with Counter Support - 1 x daily - 5 x weekly - 1 sets - 10 reps

## 2020-02-25 NOTE — Therapy (Signed)
[facility] [address] [place], [place], [address] Phone: [phone]   Fax:  [phone]  Physical Therapy Treatment  Patient Details  Name: Timothy Davenport MRN: [id number] Date of Birth: Jul 09, 1950 Referring Provider (PT): Tera MaterPiva, Enrico,DO    Encounter Date: 02/25/2020   PT End of Session - 02/25/20 1320    Visit Number 2    Number of Visits 17    Date for PT Re-Evaluation 05/22/20   60 day POC, 90 day cert   Authorization Type VA - 15 visits 02/05/20 - 06/04/20    Authorization - Visit Number 2    Authorization - Number of Visits 15    Progress Note Due on Visit 10    PT Start Time 1317    PT Stop Time 1400    PT Time Calculation (min) 43 min    Equipment Utilized During Treatment Gait belt    Activity Tolerance Patient tolerated treatment well    Behavior During Therapy WFL for tasks assessed/performed           Past Medical History:  Diagnosis Date  . Colon polyp   . Diabetes mellitus without complication (HCC)   . Diabetic neuropathy (HCC)   . HTN (hypertension)   . Patella fracture   . Renal calculi     Past Surgical History:  Procedure Laterality Date  . LUMBAR LAMINECTOMY/DECOMPRESSION MICRODISCECTOMY N/A 07/01/2019   Procedure: LUMBAR LAMINECTOMY/DECOMPRESSION MICRODISCECTOMY 1 LEVEL, THORACIC SIX-SEVEN;  Surgeon: Lisbeth RenshawNundkumar, Neelesh, MD;  [facility];  Service: Neurosurgery;  Laterality: N/A;  LUMBAR LAMINECTOMY/DECOMPRESSION MICRODISCECTOMY 1 LEVEL, THORACIC SIX-SEVEN   . ORIF PATELLA    . THORACIC DISCECTOMY N/A 07/03/2019   Procedure: Evacuation of Thoracic Hematoma;  Surgeon: Lisbeth RenshawNundkumar, Neelesh, MD;  [facility];  Service: Neurosurgery;  Laterality: N/A;    There were no vitals filed for this visit.   Subjective Assessment - 02/25/20 1319    Subjective No new complaitns. No falls or pain to report.    Pertinent History T8 paraplegia, diabetes, HTN    How long can you stand  comfortably? probably a couple minutes with RW.    How long can you walk comfortably? approx. 5-10 minutes with his son and RW.    Patient Stated Goals wants to be able to walk without any assistance    Currently in Pain? No/denies                [facility] Treatment/Exercise - 02/25/20 1321      Transfers   Transfers Sit to Stand;Stand to Sit;Lateral/Scoot Transfers    Sit to Stand 3: Mod assist;With upper extremity assist;From bed    Sit to Stand Details (indicate cue type and reason) from low mat table to RW with most assitance needed to power up x 2 reps.    Stand to Sit 3: Mod assist    Stand to Sit Details to sit to mat x1, then wheelchair after gait x1 with assistance needed for controlled descent both times.    Lateral/Scoot Transfers 5: Supervision    Lateral/Scoot Transfer Details (indicate cue type and reason) from wheelchair to mat table.      Ambulation/Gait   Ambulation/Gait Yes    Ambulation/Gait Assistance 4: Min assist;3: Mod assist    Ambulation/Gait Assistance Details wheelchair follow with second person min guard assist initially, downgrading to min/mod assist of second person as pt fatigued with last 5-10 feet of gait. pt with increaesed bil knee flexion as he  fatigued with gait despite cue for knee extension, tall posutre. continues with heavy UE reliance on walker with gait.    Ambulation Distance (Feet) 40 Feet    Assistive device Rolling walker    Gait Pattern Step-to pattern;Decreased weight shift to right;Left flexed knee in stance;Right flexed knee in stance;Narrow base of support;Decreased step length - right;Decreased step length - left;Decreased stance time - right;Decreased hip/knee flexion - right    Ambulation Surface Level;Indoor      Exercises   Exercises Other Exercises    Other Exercises  [place] [facility] [place] all for UE/trunk. Pt to continue with these. Reviewed [facility] program from last episode of care and advanced LE  strengthening program as able. Refer to [facility] program for full details. Min guard assist for balance with cues on correct ex form/technique.                PT Short Term Goals - 02/22/20 1559      PT SHORT TERM GOAL #1   Title Pt will be independent with initial HEP in order to build upon functional gains made in therapy. ALL STGS DUE 03/21/20    Time 4    Period Weeks    Status New    Target Date 03/21/20      PT SHORT TERM GOAL #2   Title Pt will perform sit <> stand with min A from standard height mat table surface in order to demo improved BLE strength and improved transfer ability.    Baseline min A from elevated mat table.    Time 4    Period Weeks    Status New      PT SHORT TERM GOAL #3   Title Pt will perform standing in RW for 3 minutes with supervision in order to demo improved functional weight bearing.    Baseline 2 minutes with min guard    Time 4    Period Weeks    Status New      PT SHORT TERM GOAL #4   Title Pt will ambulate at least 5475' with min A in order to demo improved household mobility.    Baseline 2241' with min A    Time 4    Period Weeks    Status New             PT Long Term Goals - 02/22/20 1602      PT LONG TERM GOAL #1   Title Pt and spouse will be independent with final HEP for BLE stretching and strengthening. ALL LTGS DUE 04/18/20    Time 8    Period Weeks    Status New    Target Date 04/18/20      PT LONG TERM GOAL #2   Title Pt will tolerate static/dynamic standing activities with min guard in RW for at least 4 minutes in order to demo improved standing tolerance.    Time 8    Period Weeks    Status New      PT LONG TERM GOAL #3   Title Pt will perform stand pivot transfer with RW from w/c <> mat table with min A .    Baseline not yet assessed    Time 8    Period Weeks    Status New      PT LONG TERM GOAL #4   Title Pt will perform sit <> stand with RW with min guard in order to demo improved functional mobility.  Baseline currently needing min A from elevated mat table    Time 8    Period Weeks    Status New      PT LONG TERM GOAL #5   Title Pt will ambulate at least 115' with RW with min guard in order to demo improved functional mobility.    Baseline 27' with min A    Time 8    Period Weeks    Status New                 Plan - 02/25/20 1320    Clinical Impression Statement Today's skilled session initially focused on review and advancement of pt's HEP for strengthening. No issues noted or reported in session. Remainder of session continued to focus on transfers and gait with RW. Pt fatigued after ex's needing increased assistance toward end of gait this session. The pt is progressing toward goals and should benefit from continued PT to progress toward unmet goals.    Personal Factors and Comorbidities Comorbidity 3+;Past/Current Experience    Comorbidities T8 paraplegia, diabetes, HTN    Examination-Activity Limitations Locomotion Level;Transfers;Stand    Examination-Participation Restrictions Community Activity;Yard Work   Estate manager/land agent Evolving/Moderate complexity    Rehab Potential Good    PT Frequency 2x / week   1-2x   PT Duration 8 weeks   7-8 weeks   PT Treatment/Interventions Aquatic Therapy;Manual techniques;Therapeutic exercise;Gait training;Neuromuscular re-education;Orthotic Fit/Training    PT Next Visit Plan gait training with RW (needs close w/c follow). sit <> stands, pre gait activities.    PT Home Exercise Plan Access Code: [email or web address]    Consulted and Agree with Plan of Care Patient           Patient will benefit from skilled therapeutic intervention in order to improve the following deficits and impairments:  Decreased balance,Decreased coordination,Decreased range of motion,Decreased strength,Decreased mobility,Postural dysfunction,Impaired tone,Decreased activity tolerance,Abnormal gait,Decreased endurance,Difficulty  walking,Impaired sensation  Visit Diagnosis: Other symptoms and signs involving the nervous system  Muscle weakness (generalized)  Paraplegia, incomplete (HCC)  Difficulty in walking, not elsewhere classified  Unsteadiness on feet  Abnormal posture     Problem List Patient Active Problem List   Diagnosis Date Noted  . 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 Spasticity 08/03/2019  . Paraplegia (HCC) 07/07/2019  . Neurogenic bowel 07/07/2019  . Neurogenic bladder 07/07/2019  . Thoracic myelopathy 06/30/2019   Sallyanne Kuster, PTA, [facility] [address] [place], [place] [address] [phone] 02/25/20, 2:38 PM   Name: Timothy Davenport MRN: [id number] Date of Birth: 24-Nov-1950

## 2020-03-01 ENCOUNTER — Encounter: Payer: Self-pay | Admitting: Physical Therapy

## 2020-03-01 ENCOUNTER — Ambulatory Visit: Payer: No Typology Code available for payment source | Admitting: Physical Therapy

## 2020-03-01 ENCOUNTER — Other Ambulatory Visit: Payer: Self-pay

## 2020-03-01 VITALS — BP 158/98 | HR 73

## 2020-03-01 DIAGNOSIS — M6281 Muscle weakness (generalized): Secondary | ICD-10-CM

## 2020-03-01 DIAGNOSIS — R29818 Other symptoms and signs involving the nervous system: Secondary | ICD-10-CM | POA: Diagnosis not present

## 2020-03-01 DIAGNOSIS — G8222 Paraplegia, incomplete: Secondary | ICD-10-CM

## 2020-03-01 DIAGNOSIS — R2681 Unsteadiness on feet: Secondary | ICD-10-CM

## 2020-03-01 DIAGNOSIS — R262 Difficulty in walking, not elsewhere classified: Secondary | ICD-10-CM

## 2020-03-01 NOTE — Therapy (Signed)
[facility] [address] [place], [name], [address] Phone: [phone]   Fax:  [phone]  Physical Therapy Treatment  Patient Details  Name: DERVIN VORE MRN: [id number] Date of Birth: [birth year] Referring Provider (PT): Juel Burrow    Encounter Date: 03/01/2020   PT End of Session - 03/01/20 1411    Visit Number 3    Number of Visits 17    Date for PT Re-Evaluation 05/22/20   60 day POC, 90 day cert   Authorization Type [facility] - 15 visits 02/05/20 - 06/04/20    Authorization - Visit Number 3    Authorization - Number of Visits 15    Progress Note Due on Visit 10    PT Start Time 1230    PT Stop Time 1315    PT Time Calculation (min) 45 min    Equipment Utilized During Treatment Gait belt    Activity Tolerance Patient tolerated treatment well   limited by high diastolic BP   Behavior During Therapy WFL for tasks assessed/performed           Past Medical History:  Diagnosis Date  . Colon polyp   . Diabetes mellitus without complication ([place])   . Diabetic neuropathy ([place])   . HTN (hypertension)   . Patella fracture   . Renal calculi     Past Surgical History:  Procedure Laterality Date  . LUMBAR LAMINECTOMY/DECOMPRESSION MICRODISCECTOMY N/A 07/01/2019   Procedure: LUMBAR LAMINECTOMY/DECOMPRESSION MICRODISCECTOMY 1 LEVEL, THORACIC SIX-SEVEN;  Surgeon: Consuella Lose, MD;  Location: [facility];  Service: Neurosurgery;  Laterality: N/A;  LUMBAR LAMINECTOMY/DECOMPRESSION MICRODISCECTOMY 1 LEVEL, THORACIC SIX-SEVEN   . ORIF PATELLA    . THORACIC DISCECTOMY N/A 07/03/2019   Procedure: Evacuation of Thoracic Hematoma;  Surgeon: Consuella Lose, MD;  Location: [facility];  Service: Neurosurgery;  Laterality: N/A;    Vitals:   03/01/20 1243 03/01/20 1306  BP: (!) 164/97 (!) 158/98  Pulse: (!) 52 73     Subjective Assessment - 03/01/20 1233    Subjective Had emesis yesterday after a birthday party and was  feeling nauseous - took his medications too early/at the wrong time. Feeling good today when waking up. Reports his blood sugar has been running high. Sees Dr. Dagoberto Ligas tomorrow. Pt [facility] a [facility] app[facility] at [facility].    Pertinent History T8 paraplegia, diabetes, HTN    How long can you stand comfortably? probably a couple minutes with RW.    How long can you walk comfortably? approx. 5-10 minutes with his son and RW.    Patient Stated Goals wants to be able to walk without any assistance    Currently in Pain? No/denies                             [facility] Adult PT Treatment/Exercise - 03/01/20 1305      Therapeutic Activites    Therapeutic Activities Other Therapeutic Activities    Other Therapeutic Activities pt reporting that his blood sugar and BP has been running high the past couple of days, assessed pt's BP with an elevated diastolic value with pt reporting he took his medications this morning. discussed with pt BP parameters for PT and above 100 would be too high to participate in therapy. pt reporting that his next PCP appt is in a month, discussed with pt making sure he is monitoring his blood sugar and BP values and to  bring into PCP appt in case medications need to be changed. also instructed pt to call PCP to see if he can be seen earlier regarding his elevated BP as it limits what pt can do in therapy with standing and gait activities      Exercises   Exercises Knee/Hip      Knee/Hip Exercises: Seated   Heel Slides AAROM;Strengthening;1 set;10 reps;Both    Heel Slides Limitations with use of pillowcase to decr friction, therapist assisting with ROM      Knee/Hip Exercises: Supine   Short Arc Quad Sets Strengthening;AROM;2 sets;10 reps;Both    Short Arc [employer]ad Sets Limitations visual target on how high to kick leg, incr difficulty with LLE, cues for breathing    Hip Adduction Isometric Strengthening;Both;10 reps    Hip Adduction Isometric Limitations  therapist assisting pt in getting in hooklying position, squeezing ball for 3 seconds    Knee Flexion AAROM;Strengthening;Both;2 sets;5 reps    Knee Flexion Limitations with use of red ball, knee flexion/extension, cues for digging heel in, AAROM esp with LLE into knee flexion, cues for breathing                  PT Education - 03/01/20 1410    Education Details see TA    Person(s) Educated Patient    Methods Explanation    Comprehension Verbalized understanding            PT Short Term Goals - 02/22/20 1559      PT SHORT TERM GOAL #1   Title Pt will be independent with initial HEP in order to build upon functional gains made in therapy. ALL STGS DUE 03/21/20    Time 4    Period Weeks    Status New    Target Date 03/21/20      PT SHORT TERM GOAL #2   Title Pt will perform sit <> stand with min A from standard height mat table surface in order to demo improved BLE strength and improved transfer ability.    Baseline min A from elevated mat table.    Time 4    Period Weeks    Status New      PT SHORT TERM GOAL #3   Title Pt will perform standing in RW for 3 minutes with supervision in order to demo improved functional weight bearing.    Baseline 2 minutes with min guard    Time 4    Period Weeks    Status New      PT SHORT TERM GOAL #4   Title Pt will ambulate at least 1875' with min A in order to demo improved household mobility.    Baseline 5241' with min A    Time 4    Period Weeks    Status New             PT Long Term Goals - 02/22/20 1602      PT LONG TERM GOAL #1   Title Pt and spouse will be independent with final HEP for BLE stretching and strengthening. ALL LTGS DUE 04/18/20    Time 8    Period Weeks    Status New    Target Date 04/18/20      PT LONG TERM GOAL #2   Title Pt will tolerate static/dynamic standing activities with min guard in RW for at least 4 minutes in order to demo improved standing tolerance.    Time 8    Period Weeks  Status New      PT LONG TERM GOAL #3   Title Pt will perform stand pivot transfer with RW from w/c <> mat table with min A .    Baseline not yet assessed    Time 8    Period Weeks    Status New      PT LONG TERM GOAL #4   Title Pt will perform sit <> stand with RW with min guard in order to demo improved functional mobility.    Baseline currently needing min A from elevated mat table    Time 8    Period Weeks    Status New      PT LONG TERM GOAL #5   Title Pt will ambulate at least 115' with RW with min guard in order to demo improved functional mobility.    Baseline 67' with min A    Time 8    Period Weeks    Status New                 Plan - 03/01/20 1419    Clinical Impression Statement Session today limited by higher diastolic BP value (see values). Pt reporting that he is asymptomatic and took his meds this morning and is due for another dose at [facility]. Diastolic value still WFL to participate in therapy. Limited to supine and seated exercises today with no change in diastolic after activity. Discussed with pt importance of monitoring BP and following up with PCP to see if he can get an earlier appt regarding his elevated BP. Pt verbalized understanding.    Personal Factors and Comorbidities Comorbidity 3+;Past/Current Experience    Comorbidities T8 paraplegia, diabetes, HTN    Examination-Activity Limitations Locomotion Level;Transfers;Stand    Examination-Participation Restrictions Community Activity;Yard Work   Community education officer Evolving/Moderate complexity    Rehab Potential Good    PT Frequency 2x / week   1-2x   PT Duration 8 weeks   7-8 weeks   PT Treatment/Interventions Aquatic Therapy;Manual techniques;Therapeutic exercise;Gait training;Neuromuscular re-education;Orthotic Fit/Training    PT Next Visit Plan monitor BP. has pt followed up with PCP? seated/supine strengthening. gait training with RW (needs close w/c follow). sit <>  stands, pre gait activities.    PT Home Exercise Plan Access Code: [email or web address]    Consulted and Agree with Plan of Care Patient           Patient will benefit from skilled therapeutic intervention in order to improve the following deficits and impairments:  Decreased balance,Decreased coordination,Decreased range of motion,Decreased strength,Decreased mobility,Postural dysfunction,Impaired tone,Decreased activity tolerance,Abnormal gait,Decreased endurance,Difficulty walking,Impaired sensation  Visit Diagnosis: Other symptoms and signs involving the nervous system  Muscle weakness (generalized)  Difficulty in walking, not elsewhere classified  Paraplegia, incomplete ([place])  Unsteadiness on feet     Problem List Patient Active Problem List   Diagnosis Date Noted  . 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 Spasticity 08/03/2019  . Paraplegia ([place]) 07/07/2019  . Neurogenic bowel 07/07/2019  . Neurogenic bladder 07/07/2019  . 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

## 2020-03-02 ENCOUNTER — Encounter
Payer: BC Managed Care – PPO | Attending: Physical Medicine and Rehabilitation | Admitting: Physical Medicine and Rehabilitation

## 2020-03-02 ENCOUNTER — Encounter: Payer: Self-pay | Admitting: Physical Medicine and Rehabilitation

## 2020-03-02 ENCOUNTER — Other Ambulatory Visit: Payer: Self-pay

## 2020-03-02 VITALS — BP 153/83 | HR 51 | Temp 98.5°F | Ht 71.0 in | Wt 180.2 lb

## 2020-03-02 DIAGNOSIS — N319 Neuromuscular dysfunction of bladder, unspecified: Secondary | ICD-10-CM | POA: Diagnosis present

## 2020-03-02 DIAGNOSIS — N521 Erectile dysfunction due to diseases classified elsewhere: Secondary | ICD-10-CM | POA: Insufficient documentation

## 2020-03-02 DIAGNOSIS — Z993 Dependence on wheelchair: Secondary | ICD-10-CM | POA: Diagnosis present

## 2020-03-02 DIAGNOSIS — G8220 Paraplegia, unspecified: Secondary | ICD-10-CM | POA: Insufficient documentation

## 2020-03-02 DIAGNOSIS — R252 Cramp and spasm: Secondary | ICD-10-CM | POA: Diagnosis present

## 2020-03-02 NOTE — Patient Instructions (Signed)
  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   1. Seeing Urology via [facility]- doesn't need outpt Urology anymore.   2. Stopped sertraline since was having crazy dreams.   3. For high Blood pressure- Been on Losartan 100 mg in past and BP was too low- and now on 50 mg daily- BP too high- so I suggest taking 75 mg of Losartan 1.5 tabs daily- and then discuss the BP issues with Dr Carie Caddy at [facility].  Don't think BP is related to his SCI since injury is T8 as well as elevated BP is sustained, not intermittent.     4. Has been taken off Glipizide - but now BGs are too high- 250s-300- I suggest calling Dr Carie Caddy- and seeing if he can titrate DM meds prior to appointment in February and also make sure the HbA1c gets done at his February appointment along with check BMP for kidney issues.  Last Creatinine was 1.09.   5. Bowel and bladder issues are stable- seeing [facility] in 1-2 weeks- per [place].   6. Discussed intimacy issues-  Suggest with Viagra using the pump- Psychology orders at [place]. Continue Viagra as well. Max dose is 100 mg.  Wait on injection.    7. Autonomic dysfunction- sweating above level of lesion, headache, nausea/vomiting, due to a "noxious stimulus"- a sign needs to  Find out cause of "painful stimulus".  Most common reason for this are: bowel/bladder or pain of some kind.   8. F/U in 3 months

## 2020-03-02 NOTE — Progress Notes (Signed)
Subjective:    Patient ID: Timothy Davenport, male    DOB: February 29, 1950, 70 y.o.   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    Things going pretty well.  [facility] [employer]   Saw Urology [place] in [place] to do ? urodynamics Going to go 1/19- do bladder cystoscopy?    Not having hardly any muscle spasms anymore.  Increased dosage of  Not having muscle spasms at night at all anymore.    No UTIs; no infections or hospitalizations.   Got COVID vaccine and booster in [place] in 10/21.   Wants to get off meds if possible.  Not using tramadol- taken off it in [place].  Wants to turn it in.   Came off Sertraline due to crazy dreams.   3x in last week, sat on commode- started having BM BEFORE put in suppository- man sized BM, so didn't use suppository or dig stim those 3x.   Also had another episode that felt like needed to go-   caths 7am, 11am, 3pm and 7pm- 11 pm and 3am as well. Also volumes look great.  - trying to do bowel program around 7pm   Having high BP readings and DM BGs also high- 164/97 and 158/98- and today 158/83.  Sees Dr Carie Caddy 03/24/20. PCP  BGs running high as well- 274 this AM.  Been a lot higher lately- hasn't been under 250 since started taking the BGs- 300s 1x.   Described where was vomiting- and went too long between caths.   Walked 40-50 ft with RW with therapy.   Pain Inventory Average Pain 0 Pain Right Now 0 My pain is no pain  LOCATION OF PAIN  none  BOWEL Number of stools per week: 5 Oral laxative use No  Type of laxative n/a Enema or suppository use Yes  History of colostomy No  Incontinent Yes   Does his own bowel program BLADDER  In and out cath, frequency q 4 hours Able to self cath Yes  Bladder incontinence No  Frequent urination No  Leakage with coughing No  Difficulty starting stream No  Incomplete bladder  emptying No    Mobility use a wheelchair transfers alone  Function I need assistance with the following:  meal prep, household duties and shopping  Neuro/Psych No problems in this area  Prior Studies Any changes since last visit?  no  Physicians involved in your care Any changes since last visit?  no   Family History  Problem Relation Age of Onset  . Hypertension Mother   . Hypertension Father    Social History   Socioeconomic History  . Marital status: Married    Spouse name: Not on file  . Number of children: Not on file  . Years of education: Not on file  . Highest education level: Not on file  Occupational History  . Not on file  Tobacco Use  . Smoking status: Never Smoker  . Smokeless tobacco: Never Used  Vaping Use  . Vaping Use: Never used  Substance and Sexual Activity  . Alcohol use: Yes    Comment: 2-3 drinks week  . Drug use: Yes    Types: Marijuana    Comment: occasionally  . Sexual activity: Not Currently  Other Topics Concern  . Not on file  Social History Narrative  .  Not on file   Social Determinants of Health   Financial Resource Strain: Not on file  Food Insecurity: Not on file  Transportation Needs: Not on file  Physical Activity: Not on file  Stress: Not on file  Social Connections: Not on file   Past Surgical History:  Procedure Laterality Date  . LUMBAR LAMINECTOMY/DECOMPRESSION MICRODISCECTOMY N/A 07/01/2019   Procedure: LUMBAR LAMINECTOMY/DECOMPRESSION MICRODISCECTOMY 1 LEVEL, THORACIC SIX-SEVEN;  Surgeon: Consuella Lose, MD;  Location: [facility];  Service: Neurosurgery;  Laterality: N/A;  LUMBAR LAMINECTOMY/DECOMPRESSION MICRODISCECTOMY 1 LEVEL, THORACIC SIX-SEVEN   . ORIF PATELLA    . THORACIC DISCECTOMY N/A 07/03/2019   Procedure: Evacuation of Thoracic Hematoma;  Surgeon: Consuella Lose, MD;  Location: [facility];  Service: Neurosurgery;  Laterality: N/A;   Past Medical History:  Diagnosis Date  . Colon polyp   .  Diabetes mellitus without complication ([place])   . Diabetic neuropathy ([place])   . HTN (hypertension)   . Patella fracture   . Renal calculi    BP (!) 153/83   Pulse (!) 51   Temp 98.5 F (36.9 C)   Ht 5\' 11"  (1.803 m)   Wt 180 lb 3.2 oz (81.7 kg) Comment: actual weight minus wheelchair (chair 35.8 lb)  SpO2 99%   BMI 25.13 kg/m   Opioid Risk Score:   Fall Risk Score:  `1  Depression screen PHQ 2/9  Depression screen [facility] 2/9 03/02/2020 11/27/2019 08/03/2019  Decreased Interest 1 1 3   Down, Depressed, Hopeless 1 1 0  PHQ - 2 Score 2 2 3   Altered sleeping - - 0  Tired, decreased energy - - 0  Change in appetite - - 1  Feeling bad or failure about yourself  - - 0  Trouble concentrating - - 0  Moving slowly or fidgety/restless - - 0  Suicidal thoughts - - 0  PHQ-9 Score - - 4  Difficult doing work/chores - - Not difficult at all    Review of Systems  Constitutional: Negative.   HENT: Negative.   Eyes: Negative.   Respiratory: Negative.   Cardiovascular: Negative.   Gastrointestinal:       Self bowel program  Endocrine: Negative.   Genitourinary:       I&o self cath  Musculoskeletal: Negative.   Skin: Negative.   Allergic/Immunologic: Negative.   Neurological: Negative.   Hematological: Negative.   Psychiatric/Behavioral: Negative.   All other systems reviewed and are negative.      Objective:   Physical Exam Awake, alert, appropriate, accompanied by wife, in annual w/c, NAD  MS: LEs: RLE- HF 2-/5, KE 3-/5, DF and PF 3/5 LLE: HF 3-/5, KE 4-/5, DF 4-5/ and PF 4-/5  Neuro: MAS of 2-3 in LEs- very tight in knees more than anything.      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   1. Seeing Urology via [facility]- doesn't need outpt Urology anymore.   2. Stopped sertraline since was having crazy dreams.   3. For high Blood pressure- Been on Losartan 100 mg in past  and BP was too low- and now on 50 mg daily- BP too high- so I suggest taking 75 mg of Losartan 1.5 tabs daily- and then discuss the BP issues with Dr Carie Caddy at [facility].  Don't think BP is related to his SCI since injury is T8 as well as elevated BP is sustained,  not intermittent.     4. Has been taken off Glipizide - but now BGs are too high- 250s-300- I suggest calling Dr Carie Caddy- and seeing if he can titrate DM meds prior to appointment in February and also make sure the HbA1c gets done at his February appointment along with check BMP for kidney issues.  Last Creatinine was 1.09.   5. Bowel and bladder issues are stable- seeing [facility] in 1-2 weeks- per [place].   6. Discussed intimacy issues-  Suggest with Viagra using the pump- Psychology orders at [place]. Continue Viagra as well. Max dose is 100 mg.  Wait on injection.    7. Autonomic dysfunction- sweating above level of lesion, headache, nausea/vomiting, due to a "noxious stimulus"- a sign needs to  Find out cause of "painful stimulus".  Most common reason for this are: bowel/bladder or pain of some kind.   8. F/U in 3 months   I spent a total of 45 minutes on visit- as detailed above.

## 2020-03-02 NOTE — Progress Notes (Signed)
Extensive medication list revision completed after visit.  New After Visit Summary printed and mailed to patients home address. List provided by wife is to be scanned into media.

## 2020-03-02 NOTE — Addendum Note (Signed)
Addended by: Caro Hight on: 03/02/2020 02:02 PM   Modules accepted: Orders

## 2020-03-03 ENCOUNTER — Ambulatory Visit: Payer: No Typology Code available for payment source | Admitting: Physical Therapy

## 2020-03-04 ENCOUNTER — Encounter: Payer: Self-pay | Admitting: Physical Therapy

## 2020-03-04 ENCOUNTER — Ambulatory Visit: Payer: No Typology Code available for payment source | Admitting: Physical Therapy

## 2020-03-04 ENCOUNTER — Other Ambulatory Visit: Payer: Self-pay

## 2020-03-04 VITALS — BP 151/96 | HR 89

## 2020-03-04 DIAGNOSIS — R29818 Other symptoms and signs involving the nervous system: Secondary | ICD-10-CM

## 2020-03-04 DIAGNOSIS — R262 Difficulty in walking, not elsewhere classified: Secondary | ICD-10-CM

## 2020-03-04 DIAGNOSIS — M6281 Muscle weakness (generalized): Secondary | ICD-10-CM

## 2020-03-04 DIAGNOSIS — R2681 Unsteadiness on feet: Secondary | ICD-10-CM

## 2020-03-04 NOTE — Therapy (Signed)
[facility] [address] [place], [name], [address] Phone: [phone]   Fax:  [phone]  Physical Therapy Treatment  Patient Details  Name: Timothy Davenport MRN: [id number] Date of Birth: [birth year] Referring Provider (PT): Juel Burrow    Encounter Date: 03/04/2020   PT End of Session - 03/04/20 1421    Visit Number 4    Number of Visits 17    Date for PT Re-Evaluation 05/22/20   60 day POC, 90 day cert   Authorization Type VA - 15 visits 02/05/20 - 06/04/20    Authorization - Visit Number 4    Authorization - Number of Visits 15    Progress Note Due on Visit 10    PT Start Time 1316    PT Stop Time 1400    PT Time Calculation (min) 44 min    Equipment Utilized During Treatment Gait belt    Activity Tolerance Patient tolerated treatment well    Behavior During Therapy WFL for tasks assessed/performed           Past Medical History:  Diagnosis Date  . Colon polyp   . Diabetes mellitus without complication ([place])   . Diabetic neuropathy ([place])   . HTN (hypertension)   . Patella fracture   . Renal calculi     Past Surgical History:  Procedure Laterality Date  . LUMBAR LAMINECTOMY/DECOMPRESSION MICRODISCECTOMY N/A 07/01/2019   Procedure: LUMBAR LAMINECTOMY/DECOMPRESSION MICRODISCECTOMY 1 LEVEL, THORACIC SIX-SEVEN;  Surgeon: Consuella Lose, MD;  Location: [facility];  Service: Neurosurgery;  Laterality: N/A;  LUMBAR LAMINECTOMY/DECOMPRESSION MICRODISCECTOMY 1 LEVEL, THORACIC SIX-SEVEN   . ORIF PATELLA    . THORACIC DISCECTOMY N/A 07/03/2019   Procedure: Evacuation of Thoracic Hematoma;  Surgeon: Consuella Lose, MD;  Location: [facility];  Service: Neurosurgery;  Laterality: N/A;    Vitals:   03/04/20 1321 03/04/20 1348 03/04/20 1352  BP: (!) 162/88 (!) 142/101 (!) 151/96  Pulse: (!) 50 96 89     Subjective Assessment - 03/04/20 1319    Subjective BP has still been running high. Saw Dr. Dagoberto Ligas the other  day and went well. Called his PCP and was able to get an appt on jan 20th.    Pertinent History T8 paraplegia, diabetes, HTN    How long can you stand comfortably? probably a couple minutes with RW.    How long can you walk comfortably? approx. 5-10 minutes with his son and RW.    Patient Stated Goals wants to be able to walk without any assistance    Currently in Pain? No/denies                             [facility] Adult PT Treatment/Exercise - 03/04/20 0001      Transfers   Transfers Sit to Stand;Stand to Sit;Lateral/Scoot Transfers    Sit to Stand 3: Mod assist;With upper extremity assist;From bed;4: Min assist    Sit to Stand Details (indicate cue type and reason) from elevated mat table, pt attempting to stand x2 reps and fell backwards to mat table, performed an additional 2 reps with pt able to perform with min A x2 (with help of another therapist) - both therapist's helping to extend pt's B knees to stand. during both bouts pt standing for approx. 1 minute each. when pt standing from w/c after bout of gait, pt needing mod A due to having a lower surface.    Stand to Sit  3: Mod assist;4: Min assist    Stand to Sit Details (indicate cue type and reason) Verbal cues for sequencing;Verbal cues for technique;Verbal cues for safe use of DME/AE    Stand to Sit Details cues to reach back for slowed descent, needing mod A when sitting back in low w/c after bouts of gait    Lateral/Scoot Transfers 5: Supervision    Lateral/Scoot Transfer Details (indicate cue type and reason) from w/c > mat table    Comments --      Ambulation/Gait   Ambulation/Gait Yes    Ambulation/Gait Assistance 4: Min assist;3: Mod assist    Ambulation/Gait Assistance Details wheelchair follow for safety, 2nd therapist assisting for gait with min A, downgrading to mod A of primary therapist when pt gets more fatigued to help clear RLE. during initial bout pt with incr R knee flexion during stance.  added a  small wedge to front of R shoe to help decr knee flexion moment for pt to have incr knee extension activation during stance. during 2nd bout pt able to demo improved knee extension with verbal and manual cues. pt with heavy UE reliance on RW when fatigued. intermittent assist for turning RW    Ambulation Distance (Feet) 75 Feet   x1, 35 x 1   Assistive device Rolling walker   AFO on RLE   Gait Pattern Step-to pattern;Decreased weight shift to right;Left flexed knee in stance;Right flexed knee in stance;Narrow base of support;Decreased step length - right;Decreased step length - left;Decreased stance time - right;Decreased hip/knee flexion - right    Ambulation Surface Level;Indoor    Pre-Gait Activities standing at RW at edge of mat table: weight shifting and taking a step with each foot in prep for gait x5 reps B, min A, cues for weight shift                  PT Education - 03/04/20 1420    Education Details continuing to monitor BP to bring to PCP appt    Person(s) Educated Patient    Methods Explanation    Comprehension Verbalized understanding            PT Short Term Goals - 02/22/20 1559      PT SHORT TERM GOAL #1   Title Pt will be independent with initial HEP in order to build upon functional gains made in therapy. ALL STGS DUE 03/21/20    Time 4    Period Weeks    Status New    Target Date 03/21/20      PT SHORT TERM GOAL #2   Title Pt will perform sit <> stand with min A from standard height mat table surface in order to demo improved BLE strength and improved transfer ability.    Baseline min A from elevated mat table.    Time 4    Period Weeks    Status New      PT SHORT TERM GOAL #3   Title Pt will perform standing in RW for 3 minutes with supervision in order to demo improved functional weight bearing.    Baseline 2 minutes with min guard    Time 4    Period Weeks    Status New      PT SHORT TERM GOAL #4   Title Pt will ambulate at least 21' with min A  in order to demo improved household mobility.    Baseline 55' with min A    Time 4  Period Weeks    Status New             PT Long Term Goals - 02/22/20 1602      PT LONG TERM GOAL #1   Title Pt and spouse will be independent with final HEP for BLE stretching and strengthening. ALL LTGS DUE 04/18/20    Time 8    Period Weeks    Status New    Target Date 04/18/20      PT LONG TERM GOAL #2   Title Pt will tolerate static/dynamic standing activities with min guard in RW for at least 4 minutes in order to demo improved standing tolerance.    Time 8    Period Weeks    Status New      PT LONG TERM GOAL #3   Title Pt will perform stand pivot transfer with RW from w/c <> mat table with min A .    Baseline not yet assessed    Time 8    Period Weeks    Status New      PT LONG TERM GOAL #4   Title Pt will perform sit <> stand with RW with min guard in order to demo improved functional mobility.    Baseline currently needing min A from elevated mat table    Time 8    Period Weeks    Status New      PT LONG TERM GOAL #5   Title Pt will ambulate at least 115' with RW with min guard in order to demo improved functional mobility.    Baseline 73' with min A    Time 8    Period Weeks    Status New                 Plan - 03/04/20 1423    Clinical Impression Statement Pt progressing his walking distance this session with RW- first bout of 64' with use of 2 therapists with min/mod A (when pt gets more fatigued, esp to clear RLE) and w/c follow for safety. Added a small wedge to front of pt's shoe to help with knee extension activation during stance. During 2nd bout, pt able to demo improved knee extension on R with verbal and manual cues. After gait pt's BP incr to 142/101 (pt asymptomatic). After a rest break, decr to 151/96. Pt to see PCP next week about elevated BP. Will continue to progress towards LTGs.    Personal Factors and Comorbidities Comorbidity 3+;Past/Current  Experience    Comorbidities T8 paraplegia, diabetes, HTN    Examination-Activity Limitations Locomotion Level;Transfers;Stand    Examination-Participation Restrictions Community Activity;Yard Work   Community education officer Evolving/Moderate complexity    Rehab Potential Good    PT Frequency 2x / week   1-2x   PT Duration 8 weeks   7-8 weeks   PT Treatment/Interventions Aquatic Therapy;Manual techniques;Therapeutic exercise;Gait training;Neuromuscular re-education;Orthotic Fit/Training    PT Next Visit Plan monitor BP - pt to see PCP later this week.  seated/supine strengthening. gait training with RW (needs close w/c follow). sit <> stands, pre gait activities.    PT Home Exercise Plan Access Code: [email or web address]    Consulted and Agree with Plan of Care Patient           Patient will benefit from skilled therapeutic intervention in order to improve the following deficits and impairments:  Decreased balance,Decreased coordination,Decreased range of motion,Decreased strength,Decreased mobility,Postural dysfunction,Impaired tone,Decreased activity tolerance,Abnormal gait,Decreased endurance,Difficulty walking,Impaired sensation  Visit Diagnosis: Other symptoms and signs involving the nervous system  Muscle weakness (generalized)  Difficulty in walking, not elsewhere classified  Unsteadiness on feet     Problem List Patient Active Problem List   Diagnosis Date Noted  . Erectile dysfunction due to diseases classified elsewhere 03/02/2020  . 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 Spasticity 08/03/2019  . Paraplegia ([place]) 07/07/2019  . Neurogenic bowel 07/07/2019  . Neurogenic bladder 07/07/2019  . 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 [place], [name], [address] Phone: [phone]   Fax:  [phone]  Name: LILY DUTKO MRN:  [id number] Date of Birth: [birth year]

## 2020-03-08 ENCOUNTER — Ambulatory Visit: Payer: No Typology Code available for payment source | Admitting: Physical Therapy

## 2020-03-09 ENCOUNTER — Ambulatory Visit: Payer: No Typology Code available for payment source | Admitting: Physical Therapy

## 2020-03-09 ENCOUNTER — Other Ambulatory Visit: Payer: Self-pay

## 2020-03-09 ENCOUNTER — Encounter: Payer: Self-pay | Admitting: Physical Therapy

## 2020-03-09 VITALS — BP 146/88 | HR 88

## 2020-03-09 DIAGNOSIS — R29818 Other symptoms and signs involving the nervous system: Secondary | ICD-10-CM | POA: Diagnosis not present

## 2020-03-09 DIAGNOSIS — R2681 Unsteadiness on feet: Secondary | ICD-10-CM

## 2020-03-09 DIAGNOSIS — R262 Difficulty in walking, not elsewhere classified: Secondary | ICD-10-CM

## 2020-03-09 DIAGNOSIS — M6281 Muscle weakness (generalized): Secondary | ICD-10-CM

## 2020-03-09 NOTE — Therapy (Signed)
[facility] [address] [place], [name], [address] Phone: [phone]   Fax:  [phone]  Physical Therapy Treatment  Patient Details  Name: Timothy Davenport MRN: [id number] Date of Birth: 1950-08-19 Referring Provider (PT): Juel Burrow    Encounter Date: 03/09/2020   PT End of Session - 03/09/20 1448    Visit Number 5    Number of Visits 17    Date for PT Re-Evaluation 05/22/20   60 day POC, 90 day cert   Authorization Type VA - 15 visits 02/05/20 - 06/04/20    Authorization - Visit Number 5    Authorization - Number of Visits 15    Progress Note Due on Visit 10    PT Start Time 1400    PT Stop Time 1440    PT Time Calculation (min) 40 min    Equipment Utilized During Treatment Gait belt    Activity Tolerance Patient tolerated treatment well    Behavior During Therapy WFL for tasks assessed/performed           Past Medical History:  Diagnosis Date  . Colon polyp   . Diabetes mellitus without complication ([place])   . Diabetic neuropathy ([place])   . HTN (hypertension)   . Patella fracture   . Renal calculi     Past Surgical History:  Procedure Laterality Date  . LUMBAR LAMINECTOMY/DECOMPRESSION MICRODISCECTOMY N/A 07/01/2019   Procedure: LUMBAR LAMINECTOMY/DECOMPRESSION MICRODISCECTOMY 1 LEVEL, THORACIC SIX-SEVEN;  Surgeon: Consuella Lose, MD;  Location: [facility];  Service: Neurosurgery;  Laterality: N/A;  LUMBAR LAMINECTOMY/DECOMPRESSION MICRODISCECTOMY 1 LEVEL, THORACIC SIX-SEVEN   . ORIF PATELLA    . THORACIC DISCECTOMY N/A 07/03/2019   Procedure: Evacuation of Thoracic Hematoma;  Surgeon: Consuella Lose, MD;  Location: [facility];  Service: Neurosurgery;  Laterality: N/A;    Vitals:   03/09/20 1404 03/09/20 1417 03/09/20 1430  BP: (!) 160/85 (!) 161/96 (!) 146/88  Pulse: (!) 50 92 88     Subjective Assessment - 03/09/20 1401    Subjective No changes since he was last here. His appt down in  [place] has been rescheduled until the beginning of feb. Has not done anymore walking at home.    Pertinent History T8 paraplegia, diabetes, HTN    How long can you stand comfortably? probably a couple minutes with RW.    How long can you walk comfortably? approx. 5-10 minutes with his son and RW.    Patient Stated Goals wants to be able to walk without any assistance    Currently in Pain? No/denies                             [facility] Adult PT Treatment/Exercise - 03/09/20 0001      Transfers   Transfers Sit to Stand;Stand to Sit;Lateral/Scoot Transfers    Sit to Stand 3: Mod assist;With upper extremity assist;From bed;4: Min assist    Sit to Stand Details (indicate cue type and reason) from elevated mat table with RW - min/mod A and additional support from another therapist to help steady RW and pt performs with BUE on RW and from w/c to sink with min A (pt pulling up with BUE)    Stand to Sit 3: Mod assist;4: Min assist    Stand to Sit Details (indicate cue type and reason) Verbal cues for sequencing;Verbal cues for technique;Verbal cues for safe use of DME/AE    Stand to Sit Details  min A at elevated mat table, needing mod A when sitting back into w/c after gait for slowed descent    Lateral/Scoot Transfers 5: Supervision    Lateral/Scoot Transfer Details (indicate cue type and reason) from w/c > mat table    Comments x3 reps from elevated mat table with therapist providing assist for R knee extension, x3 reps from w/c with BUE support at sink. standing at sink: 3 x 30 second reps - standing working on B knee extension in standing, and performed x5 reps of weight shifting R/L      Ambulation/Gait   Ambulation/Gait Yes    Ambulation/Gait Assistance 4: Min assist;3: Mod assist    Ambulation/Gait Assistance Details x2 PTs and wheelchair follow for safety. one therapist assisting for balance and steadying RW, primary therapist assisting with turns, clearing of RLE at times,  and cues for R knee extension during stance. pt heavily reliant on RW and demonstrates incr forward flexed posture when fatigued    Ambulation Distance (Feet) 115 Feet    Assistive device Rolling walker    Gait Pattern Step-to pattern;Decreased weight shift to right;Left flexed knee in stance;Right flexed knee in stance;Narrow base of support;Decreased step length - right;Decreased step length - left;Decreased stance time - right;Decreased hip/knee flexion - right    Ambulation Surface Level;Indoor      Exercises   Exercises Knee/Hip      Knee/Hip Exercises: Aerobic   Stepper Scifit with BLE only starting at gear 1.5 for 2 minutes and gear 2 for 3 minutes for a total of 5 minutes, needing mod A of therapist to help prevent w/c from tipping                    PT Short Term Goals - 02/22/20 1559      PT SHORT TERM GOAL #1   Title Pt will be independent with initial HEP in order to build upon functional gains made in therapy. ALL STGS DUE 03/21/20    Time 4    Period Weeks    Status New    Target Date 03/21/20      PT SHORT TERM GOAL #2   Title Pt will perform sit <> stand with min A from standard height mat table surface in order to demo improved BLE strength and improved transfer ability.    Baseline min A from elevated mat table.    Time 4    Period Weeks    Status New      PT SHORT TERM GOAL #3   Title Pt will perform standing in RW for 3 minutes with supervision in order to demo improved functional weight bearing.    Baseline 2 minutes with min guard    Time 4    Period Weeks    Status New      PT SHORT TERM GOAL #4   Title Pt will ambulate at least 29' with min A in order to demo improved household mobility.    Baseline 51' with min A    Time 4    Period Weeks    Status New             PT Long Term Goals - 02/22/20 1602      PT LONG TERM GOAL #1   Title Pt and spouse will be independent with final HEP for BLE stretching and strengthening. ALL LTGS DUE  04/18/20    Time 8    Period Weeks    Status New  Target Date 04/18/20      PT LONG TERM GOAL #2   Title Pt will tolerate static/dynamic standing activities with min guard in RW for at least 4 minutes in order to demo improved standing tolerance.    Time 8    Period Weeks    Status New      PT LONG TERM GOAL #3   Title Pt will perform stand pivot transfer with RW from w/c <> mat table with min A .    Baseline not yet assessed    Time 8    Period Weeks    Status New      PT LONG TERM GOAL #4   Title Pt will perform sit <> stand with RW with min guard in order to demo improved functional mobility.    Baseline currently needing min A from elevated mat table    Time 8    Period Weeks    Status New      PT LONG TERM GOAL #5   Title Pt will ambulate at least 115' with RW with min guard in order to demo improved functional mobility.    Baseline 69' with min A    Time 8    Period Weeks    Status New                 Plan - 03/09/20 1450    Clinical Impression Statement Pt's BP WFL throughout today's session even after activity. Pt able to progress his gait distance with RW to 115' today with min/mod A x2. Pt needing w/c follow for safety. Performed the SciFit for the first time today with BLE only, needing assist from therapist to prevent w/c from tipping. Pt is making excellent progress, will continue to progress towards LTGs.    Personal Factors and Comorbidities Comorbidity 3+;Past/Current Experience    Comorbidities T8 paraplegia, diabetes, HTN    Examination-Activity Limitations Locomotion Level;Transfers;Stand    Examination-Participation Restrictions Community Activity;Yard Work   Community education officer Evolving/Moderate complexity    Rehab Potential Good    PT Frequency 2x / week   1-2x   PT Duration 8 weeks   7-8 weeks   PT Treatment/Interventions Aquatic Therapy;Manual techniques;Therapeutic exercise;Gait training;Neuromuscular  re-education;Orthotic Fit/Training    PT Next Visit Plan monitor BP - pt to see PCP at the beginning of feb.  seated/supine strengthening. gait training with RW (needs close w/c follow). sit <> stands, pre gait activities.    PT Home Exercise Plan Access Code: [email or web address]    Consulted and Agree with Plan of Care Patient           Patient will benefit from skilled therapeutic intervention in order to improve the following deficits and impairments:  Decreased balance,Decreased coordination,Decreased range of motion,Decreased strength,Decreased mobility,Postural dysfunction,Impaired tone,Decreased activity tolerance,Abnormal gait,Decreased endurance,Difficulty walking,Impaired sensation  Visit Diagnosis: Other symptoms and signs involving the nervous system  Muscle weakness (generalized)  Difficulty in walking, not elsewhere classified  Unsteadiness on feet     Problem List Patient Active Problem List   Diagnosis Date Noted  . Erectile dysfunction due to diseases classified elsewhere 03/02/2020  . 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 Spasticity 08/03/2019  . Paraplegia ([place]) 07/07/2019  . Neurogenic bowel 07/07/2019  . Neurogenic bladder 07/07/2019  . Thoracic myelopathy 06/30/2019    Arliss Journey, PT, DPT  03/09/2020, 2:52 PM  [facility] [address] [place], [name], [address] Phone: [phone]   Fax:  [place]  Name: Timothy Davenport MRN: [id number] Date of Birth: 1950-08-26

## 2020-03-10 ENCOUNTER — Ambulatory Visit: Payer: No Typology Code available for payment source | Admitting: Physical Therapy

## 2020-03-11 ENCOUNTER — Encounter: Payer: Self-pay | Admitting: Physical Therapy

## 2020-03-11 ENCOUNTER — Other Ambulatory Visit: Payer: Self-pay

## 2020-03-11 ENCOUNTER — Ambulatory Visit: Payer: No Typology Code available for payment source | Admitting: Physical Therapy

## 2020-03-11 VITALS — BP 123/87 | HR 101

## 2020-03-11 DIAGNOSIS — R29818 Other symptoms and signs involving the nervous system: Secondary | ICD-10-CM

## 2020-03-11 DIAGNOSIS — R262 Difficulty in walking, not elsewhere classified: Secondary | ICD-10-CM

## 2020-03-11 DIAGNOSIS — R2681 Unsteadiness on feet: Secondary | ICD-10-CM

## 2020-03-11 DIAGNOSIS — M6281 Muscle weakness (generalized): Secondary | ICD-10-CM

## 2020-03-11 NOTE — Therapy (Signed)
[facility] [address] [place], [place], [address] Phone: [phone]   Fax:  [phone]  Physical Therapy Treatment  Patient Details  Name: Timothy Davenport MRN: [id number] Date of Birth: 1950-11-02 Referring Provider (PT): Tera Mater    Encounter Date: 03/11/2020   PT End of Session - 03/11/20 1103    Visit Number 6    Number of Visits 17    Date for PT Re-Evaluation 05/22/20   60 day POC, 90 day cert   Authorization Type VA - 15 visits 02/05/20 - 06/04/20    Authorization - Visit Number 6    Authorization - Number of Visits 15    Progress Note Due on Visit 10    PT Start Time 1100    PT Stop Time 1145    PT Time Calculation (min) 45 min    Equipment Utilized During Treatment Gait belt    Activity Tolerance Patient tolerated treatment well    Behavior During Therapy WFL for tasks assessed/performed           Past Medical History:  Diagnosis Date  . Colon polyp   . Diabetes mellitus without complication (HCC)   . Diabetic neuropathy (HCC)   . HTN (hypertension)   . Patella fracture   . Renal calculi     Past Surgical History:  Procedure Laterality Date  . LUMBAR LAMINECTOMY/DECOMPRESSION MICRODISCECTOMY N/A 07/01/2019   Procedure: LUMBAR LAMINECTOMY/DECOMPRESSION MICRODISCECTOMY 1 LEVEL, THORACIC SIX-SEVEN;  Surgeon: Lisbeth Renshaw, MD;  Location: [facility] OR;  Service: Neurosurgery;  Laterality: N/A;  LUMBAR LAMINECTOMY/DECOMPRESSION MICRODISCECTOMY 1 LEVEL, THORACIC SIX-SEVEN   . ORIF PATELLA    . THORACIC DISCECTOMY N/A 07/03/2019   Procedure: Evacuation of Thoracic Hematoma;  Surgeon: Lisbeth Renshaw, MD;  Location: [facility] OR;  Service: Neurosurgery;  Laterality: N/A;    Vitals:   03/11/20 1104 03/11/20 1136 03/11/20 1142  BP: 129/81 (!) 136/92 123/87  Pulse: 88 (!) 103 (!) 101     Subjective Assessment - 03/11/20 1102    Subjective Saw his primary MD yesterday. Started him on a  medication/shot to help with bringing his blood sugars down. Reports the MD was not concerned with his BP's. No pain or other changes.    Pertinent History T8 paraplegia, diabetes, HTN    How long can you stand comfortably? probably a couple minutes with RW.    How long can you walk comfortably? approx. 5-10 minutes with his son and RW.    Patient Stated Goals wants to be able to walk without any assistance    Currently in Pain? No/denies                  [facility] Adult PT Treatment/Exercise - 03/11/20 1106      Transfers   Transfers Sit to Stand;Stand to Sit;Lateral/Scoot Transfers    Sit to Stand 3: Mod assist;With upper extremity assist;From bed;4: Min assist    Sit to Stand Details Verbal cues for sequencing;Verbal cues for technique;Verbal cues for safe use of DME/AE;Manual facilitation for weight shifting    Sit to Stand Details (indicate cue type and reason) from low mat to RW x1 with min//mod assist of 2, then wheelchair>RW x2 with same assist. wheelchair>sink with min/mod assist of one person. cues each time to scoot to the edge, rocking used to gain momentum with cues to power up through LE's. Pt needed bil hands on RW/sink to pull up each time. stability assist provided to walker.    Stand to  Sit 3: Mod assist;4: Min assist    Stand to Sit Details (indicate cue type and reason) Verbal cues for sequencing;Verbal cues for technique;Verbal cues for safe use of DME/AE    Stand to Sit Details assist to control descent each time wtih cues to "bow" so to sit safely into wheelchair due to leg rest in the way.    Lateral/Scoot Transfers 5: Supervision    Lateral/Scoot Transfer Details (indicate cue type and reason) from wheelchair>mat table with UE assist.      Ambulation/Gait   Ambulation/Gait Yes    Ambulation/Gait Assistance 4: Min assist;3: Mod assist    Ambulation/Gait Assistance Details x2 therapists with gait, plus tech with wheelchair follow. 2 seated rest breaks needed with  session for the 115 feet of gait due to fatigue. increased assistance with increased bil knee flexion and hip/trunk flexion as pt fatigued. this improved after each rest break with pt needing min assist of 2 vs mod.    Ambulation Distance (Feet) 115 Feet    Assistive device Rolling walker    Gait Pattern Step-to pattern;Decreased weight shift to right;Left flexed knee in stance;Right flexed knee in stance;Narrow base of support;Decreased step length - right;Decreased step length - left;Decreased stance time - right;Decreased hip/knee flexion - right    Ambulation Surface Level;Indoor      Therapeutic Activites    Therapeutic Activities Other Therapeutic Activities    Other Therapeutic Activities sit<>stand x 3 reps at sink. working on [employer]- knee extension, hip extension and trunk extension. Progressing to lateral weight shifitng with assist/faciliation needed as pt tends to move shoulders vs hips with this task.      Knee/Hip Exercises: Aerobic   Stepper Scifit with BLE only starting at level 2.0 for 5 minutes, needing mod A of therapist to help prevent w/c from tipping with wedges under wheels and anti tippers down as well.                    PT Short Term Goals - 02/22/20 1559      PT SHORT TERM GOAL #1   Title Pt will be independent with initial HEP in order to build upon functional gains made in therapy. ALL STGS DUE 03/21/20    Time 4    Period Weeks    Status New    Target Date 03/21/20      PT SHORT TERM GOAL #2   Title Pt will perform sit <> stand with min A from standard height mat table surface in order to demo improved BLE strength and improved transfer ability.    Baseline min A from elevated mat table.    Time 4    Period Weeks    Status New      PT SHORT TERM GOAL #3   Title Pt will perform standing in RW for 3 minutes with supervision in order to demo improved functional weight bearing.    Baseline 2 minutes with min guard    Time 4    Period  Weeks    Status New      PT SHORT TERM GOAL #4   Title Pt will ambulate at least 70' with min A in order to demo improved household mobility.    Baseline 72' with min A    Time 4    Period Weeks    Status New             PT Long Term Goals - 02/22/20 7425  PT LONG TERM GOAL #1   Title Pt and spouse will be independent with final HEP for BLE stretching and strengthening. ALL LTGS DUE 04/18/20    Time 8    Period Weeks    Status New    Target Date 04/18/20      PT LONG TERM GOAL #2   Title Pt will tolerate static/dynamic standing activities with min guard in RW for at least 4 minutes in order to demo improved standing tolerance.    Time 8    Period Weeks    Status New      PT LONG TERM GOAL #3   Title Pt will perform stand pivot transfer with RW from w/c <> mat table with min A .    Baseline not yet assessed    Time 8    Period Weeks    Status New      PT LONG TERM GOAL #4   Title Pt will perform sit <> stand with RW with min guard in order to demo improved functional mobility.    Baseline currently needing min A from elevated mat table    Time 8    Period Weeks    Status New      PT LONG TERM GOAL #5   Title Pt will ambulate at least 115' with RW with min guard in order to demo improved functional mobility.    Baseline 69' with min A    Time 8    Period Weeks    Status New                 Plan - 03/11/20 1103    Clinical Impression Statement Today's skilled session continued to focus on strengthening, standing and gait with rest breaks taken as needed. No other issues noted or reported with session. The pt is progressing toward goals and should benefit from continued PT to progress toward unmet goals.    Personal Factors and Comorbidities Comorbidity 3+;Past/Current Experience    Comorbidities T8 paraplegia, diabetes, HTN    Examination-Activity Limitations Locomotion Level;Transfers;Stand    Examination-Participation Restrictions Community  Activity;Yard Work   Community education officer Evolving/Moderate complexity    Rehab Potential Good    PT Frequency 2x / week   1-2x   PT Duration 8 weeks   7-8 weeks   PT Treatment/Interventions Aquatic Therapy;Manual techniques;Therapeutic exercise;Gait training;Neuromuscular re-education;Orthotic Fit/Training    PT Next Visit Plan monitor BP - pt to see PCP at the beginning of feb.  seated/supine strengthening. gait training with RW (needs close w/c follow). sit <> stands, pre gait activities.    PT Home Exercise Plan Access Code: [email or web address]    Consulted and Agree with Plan of Care Patient           Patient will benefit from skilled therapeutic intervention in order to improve the following deficits and impairments:  Decreased balance,Decreased coordination,Decreased range of motion,Decreased strength,Decreased mobility,Postural dysfunction,Impaired tone,Decreased activity tolerance,Abnormal gait,Decreased endurance,Difficulty walking,Impaired sensation  Visit Diagnosis: Muscle weakness (generalized)  Difficulty in walking, not elsewhere classified  Unsteadiness on feet  Other symptoms and signs involving the nervous system     Problem List Patient Active Problem List   Diagnosis Date Noted  . Erectile dysfunction due to diseases classified elsewhere 03/02/2020  . 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 Spasticity 08/03/2019  . Paraplegia ([place]) 07/07/2019  . Neurogenic bowel 07/07/2019  . Neurogenic bladder 07/07/2019  . 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 [place], [place] [address] [phone] 03/11/20, 1:07 PM   Name: Timothy Davenport MRN: [id number] Date of Birth: 07/29/1950

## 2020-03-15 ENCOUNTER — Other Ambulatory Visit: Payer: Self-pay

## 2020-03-15 ENCOUNTER — Encounter: Payer: Self-pay | Admitting: Physical Therapy

## 2020-03-15 ENCOUNTER — Ambulatory Visit: Payer: No Typology Code available for payment source | Admitting: Physical Therapy

## 2020-03-15 VITALS — BP 130/90 | HR 89

## 2020-03-15 DIAGNOSIS — R262 Difficulty in walking, not elsewhere classified: Secondary | ICD-10-CM

## 2020-03-15 DIAGNOSIS — R293 Abnormal posture: Secondary | ICD-10-CM

## 2020-03-15 DIAGNOSIS — R2681 Unsteadiness on feet: Secondary | ICD-10-CM

## 2020-03-15 DIAGNOSIS — R29818 Other symptoms and signs involving the nervous system: Secondary | ICD-10-CM | POA: Diagnosis not present

## 2020-03-15 DIAGNOSIS — M6281 Muscle weakness (generalized): Secondary | ICD-10-CM

## 2020-03-15 NOTE — Therapy (Signed)
[facility] [address] [place], [name], [address] Phone: [phone]   Fax:  [phone]  Physical Therapy Treatment  Patient Details  Name: Timothy Davenport MRN: [id number] Date of Birth: 03/30/1950 Referring Provider (PT): Juel Burrow    Encounter Date: 03/15/2020   PT End of Session - 03/15/20 1143    Visit Number 7    Number of Visits 17    Date for PT Re-Evaluation 05/22/20   60 day POC, 90 day cert   Authorization Type VA - 15 visits 02/05/20 - 06/04/20    Authorization - Visit Number 7    Authorization - Number of Visits 15    Progress Note Due on Visit 10    PT Start Time 1101    PT Stop Time 1142    PT Time Calculation (min) 41 min    Equipment Utilized During Treatment Gait belt    Activity Tolerance Patient tolerated treatment well    Behavior During Therapy WFL for tasks assessed/performed           Past Medical History:  Diagnosis Date  . Colon polyp   . Diabetes mellitus without complication ([place])   . Diabetic neuropathy ([place])   . HTN (hypertension)   . Patella fracture   . Renal calculi     Past Surgical History:  Procedure Laterality Date  . LUMBAR LAMINECTOMY/DECOMPRESSION MICRODISCECTOMY N/A 07/01/2019   Procedure: LUMBAR LAMINECTOMY/DECOMPRESSION MICRODISCECTOMY 1 LEVEL, THORACIC SIX-SEVEN;  Surgeon: Consuella Lose, MD;  Location: [place];  Service: Neurosurgery;  Laterality: N/A;  LUMBAR LAMINECTOMY/DECOMPRESSION MICRODISCECTOMY 1 LEVEL, THORACIC SIX-SEVEN   . ORIF PATELLA    . THORACIC DISCECTOMY N/A 07/03/2019   Procedure: Evacuation of Thoracic Hematoma;  Surgeon: Consuella Lose, MD;  Location: [facility];  Service: Neurosurgery;  Laterality: N/A;    Vitals:   03/15/20 1106 03/15/20 1125  BP: (!) 148/87 130/90  Pulse: 68 89     Subjective Assessment - 03/15/20 1103    Subjective Feeling more tired today. No changes since he was last here. Blood sugars are still high.     Pertinent History T8 paraplegia, diabetes, HTN    How long can you stand comfortably? probably a couple minutes with RW.    How long can you walk comfortably? approx. 5-10 minutes with his son and RW.    Patient Stated Goals wants to be able to walk without any assistance    Currently in Pain? No/denies                             [facility] Adult PT Treatment/Exercise - 03/15/20 0001      Transfers   Transfers Sit to Stand;Stand to Sit;Lateral/Scoot Transfers    Sit to Stand 4: Min assist;3: Mod assist    Sit to Stand Details Verbal cues for sequencing;Verbal cues for technique;Verbal cues for safe use of DME/AE;Manual facilitation for weight shifting    Sit to Stand Details (indicate cue type and reason) from elevated mat table with min A, from lower w/c with mod A    Stand to Sit 3: Mod assist;4: Min assist    Stand to Sit Details (indicate cue type and reason) Verbal cues for sequencing;Verbal cues for technique;Verbal cues for safe use of DME/AE    Stand to Sit Details min A when sitting back down towards mat, mod A to control descent back into wheelchair with cues for pt to reach back to  chair    Lateral/Scoot Transfers 5: Supervision    Lateral/Scoot Transfer Details (indicate cue type and reason) from wheelchair <> mat table    Comments prior to gait: performed x3 reps sit <> stand from elevated mat table, cues for B knee extension and posture in standing      Ambulation/Gait   Ambulation/Gait Yes    Ambulation/Gait Assistance 4: Min assist;3: Mod assist    Ambulation/Gait Assistance Details x1 PT and x1 tech assisting with gait and x1 tech providing wheelchair follow. incr assistance needing when pt was fatiguing esp with RLE foot clearance. cued for knee extension and posture during stance. initial cues to push RW vs. picking it up. incr forward flexed posture and decr R foot clearance towards end of gait. cues for wider BOS at times esp with RLE    Ambulation  Distance (Feet) 80 Feet   x1, 45 x 1   Assistive device Rolling walker    Gait Pattern Step-to pattern;Decreased weight shift to right;Left flexed knee in stance;Right flexed knee in stance;Narrow base of support;Decreased step length - right;Decreased step length - left;Decreased stance time - right;Decreased hip/knee flexion - right    Ambulation Surface Level;Indoor    Gait Comments pt asking about wearing his other tennis shoes at home. pt stating that he can't fit his brace in his other shoes but have still done walking with them at home. discussed with pt that this is not safe due to pt needing R AFO on RLE due to weakness to help with safety during gait and to wear his brace whenever doing any standing at home in his current shoes. pt verbalized understanding      Knee/Hip Exercises: Supine   Short Arc Quad Sets Strengthening;AROM;10 reps;Both;1 set    Short Arc Quad Sets Limitations --   visual target on how high to kick leg, incr difficulty with LLE, cues for breathing   Other Supine Knee/Hip Exercises bent knee fall outs x10 reps B, tactile cue on where to bring out leg and then bringing leg back slowly to midline, pt with incr difficulty with hip ADD bringing it back to midline    Other Supine Knee/Hip Exercises x10 reps in hooklying position: isometric hip ABD pressing out into gait belt around distal thighs                  PT Education - 03/15/20 1414    Education Details see gait section.    Person(s) Educated Patient    Methods Explanation    Comprehension Verbalized understanding            PT Short Term Goals - 02/22/20 1559      PT SHORT TERM GOAL #1   Title Pt will be independent with initial HEP in order to build upon functional gains made in therapy. ALL STGS DUE 03/21/20    Time 4    Period Weeks    Status New    Target Date 03/21/20      PT SHORT TERM GOAL #2   Title Pt will perform sit <> stand with min A from standard height mat table surface in  order to demo improved BLE strength and improved transfer ability.    Baseline min A from elevated mat table.    Time 4    Period Weeks    Status New      PT SHORT TERM GOAL #3   Title Pt will perform standing in RW for 3 minutes with  supervision in order to demo improved functional weight bearing.    Baseline 2 minutes with min guard    Time 4    Period Weeks    Status New      PT SHORT TERM GOAL #4   Title Pt will ambulate at least 47' with min A in order to demo improved household mobility.    Baseline 10' with min A    Time 4    Period Weeks    Status New             PT Long Term Goals - 02/22/20 1602      PT LONG TERM GOAL #1   Title Pt and spouse will be independent with final HEP for BLE stretching and strengthening. ALL LTGS DUE 04/18/20    Time 8    Period Weeks    Status New    Target Date 04/18/20      PT LONG TERM GOAL #2   Title Pt will tolerate static/dynamic standing activities with min guard in RW for at least 4 minutes in order to demo improved standing tolerance.    Time 8    Period Weeks    Status New      PT LONG TERM GOAL #3   Title Pt will perform stand pivot transfer with RW from w/c <> mat table with min A .    Baseline not yet assessed    Time 8    Period Weeks    Status New      PT LONG TERM GOAL #4   Title Pt will perform sit <> stand with RW with min guard in order to demo improved functional mobility.    Baseline currently needing min A from elevated mat table    Time 8    Period Weeks    Status New      PT LONG TERM GOAL #5   Title Pt will ambulate at least 115' with RW with min guard in order to demo improved functional mobility.    Baseline 59' with min A    Time 8    Period Weeks    Status New                 Plan - 03/15/20 1419    Clinical Impression Statement Pt's BP WFL for therapy today before and after gait. Continued to focus on gait training with RW and BLE strengthening. Pt needing incr assist from min-mod  during gait when pt fatigued with demonstrating incr forward flexed posture and decr RLE foot clearance. Needed a seated rest break during total 115' bout today. Will continue to progress towards LTGs.    Personal Factors and Comorbidities Comorbidity 3+;Past/Current Experience    Comorbidities T8 paraplegia, diabetes, HTN    Examination-Activity Limitations Locomotion Level;Transfers;Stand    Examination-Participation Restrictions Community Activity;Yard Work   Community education officer Evolving/Moderate complexity    Rehab Potential Good    PT Frequency 2x / week   1-2x   PT Duration 8 weeks   7-8 weeks   PT Treatment/Interventions Aquatic Therapy;Manual techniques;Therapeutic exercise;Gait training;Neuromuscular re-education;Orthotic Fit/Training    PT Next Visit Plan STGs due 1/31 monitor BP.  seated/supine strengthening. gait training with RW (needs close w/c follow). sit <> stands, pre gait activities.    PT Home Exercise Plan Access Code: [email or web address]    Consulted and Agree with Plan of Care Patient           Patient will  benefit from skilled therapeutic intervention in order to improve the following deficits and impairments:  Decreased balance,Decreased coordination,Decreased range of motion,Decreased strength,Decreased mobility,Postural dysfunction,Impaired tone,Decreased activity tolerance,Abnormal gait,Decreased endurance,Difficulty walking,Impaired sensation  Visit Diagnosis: Muscle weakness (generalized)  Difficulty in walking, not elsewhere classified  Unsteadiness on feet  Other symptoms and signs involving the nervous system  Abnormal posture     Problem List Patient Active Problem List   Diagnosis Date Noted  . Erectile dysfunction due to diseases classified elsewhere 03/02/2020  . 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 Spasticity 08/03/2019  . Paraplegia ([place]) 07/07/2019  . Neurogenic bowel 07/07/2019  . Neurogenic bladder 07/07/2019  .  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 [place], [name], [address] Phone: [phone]   Fax:  [phone]  Name: Timothy Davenport MRN: [id number] Date of Birth: November 11, 1950

## 2020-03-17 ENCOUNTER — Encounter: Payer: Self-pay | Admitting: Physical Therapy

## 2020-03-17 ENCOUNTER — Other Ambulatory Visit: Payer: Self-pay

## 2020-03-17 ENCOUNTER — Ambulatory Visit: Payer: No Typology Code available for payment source | Admitting: Physical Therapy

## 2020-03-17 DIAGNOSIS — R29818 Other symptoms and signs involving the nervous system: Secondary | ICD-10-CM | POA: Diagnosis not present

## 2020-03-17 DIAGNOSIS — M6281 Muscle weakness (generalized): Secondary | ICD-10-CM

## 2020-03-17 DIAGNOSIS — R2681 Unsteadiness on feet: Secondary | ICD-10-CM

## 2020-03-17 DIAGNOSIS — R262 Difficulty in walking, not elsewhere classified: Secondary | ICD-10-CM

## 2020-03-17 DIAGNOSIS — R293 Abnormal posture: Secondary | ICD-10-CM

## 2020-03-18 NOTE — Therapy (Signed)
[facility] [address] [place], [name], [address] Phone: [phone]   Fax:  [phone]  Physical Therapy Treatment  Patient Details  Name: Timothy Davenport MRN: [id number] Date of Birth: 11-25-1950 Referring Provider (PT): Juel Burrow    Encounter Date: 03/17/2020   PT End of Session - 03/17/20 1237    Visit Number 8    Number of Visits 17    Date for PT Re-Evaluation 05/22/20   60 day POC, 90 day cert   Authorization Type VA - 15 visits 02/05/20 - 06/04/20    Authorization - Visit Number 8    Authorization - Number of Visits 15    Progress Note Due on Visit 10    PT Start Time 2637    PT Stop Time 1315    PT Time Calculation (min) 40 min    Equipment Utilized During Treatment Gait belt    Activity Tolerance Patient tolerated treatment well    Behavior During Therapy WFL for tasks assessed/performed           Past Medical History:  Diagnosis Date  . Colon polyp   . Diabetes mellitus without complication ([place])   . Diabetic neuropathy ([place])   . HTN (hypertension)   . Patella fracture   . Renal calculi     Past Surgical History:  Procedure Laterality Date  . LUMBAR LAMINECTOMY/DECOMPRESSION MICRODISCECTOMY N/A 07/01/2019   Procedure: LUMBAR LAMINECTOMY/DECOMPRESSION MICRODISCECTOMY 1 LEVEL, THORACIC SIX-SEVEN;  Surgeon: Consuella Lose, MD;  Location: [facility];  Service: Neurosurgery;  Laterality: N/A;  LUMBAR LAMINECTOMY/DECOMPRESSION MICRODISCECTOMY 1 LEVEL, THORACIC SIX-SEVEN   . ORIF PATELLA    . THORACIC DISCECTOMY N/A 07/03/2019   Procedure: Evacuation of Thoracic Hematoma;  Surgeon: Consuella Lose, MD;  Location: [facility];  Service: Neurosurgery;  Laterality: N/A;    There were no vitals filed for this visit.   Subjective Assessment - 03/17/20 1237    Subjective No now complaints. No falls or pain to report.    Pertinent History T8 paraplegia, diabetes, HTN    How long can you stand  comfortably? probably a couple minutes with RW.    How long can you walk comfortably? approx. 5-10 minutes with his son and RW.    Patient Stated Goals wants to be able to walk without any assistance    Currently in Pain? No/denies                  [facility] Adult PT Treatment/Exercise - 03/17/20 1239      Transfers   Transfers Sit to Stand;Stand to Sit;Lateral/Scoot Transfers    Sit to Stand 4: Min assist;With upper extremity assist;From bed    Sit to Stand Details Verbal cues for sequencing;Verbal cues for technique;Verbal cues for safe use of DME/AE;Manual facilitation for weight shifting    Sit to Stand Details (indicate cue type and reason) from low mat>RW    Stand to Sit 4: Min assist;With upper extremity assist;To bed    Stand to Sit Details (indicate cue type and reason) Verbal cues for sequencing;Verbal cues for technique;Verbal cues for safe use of DME/AE    Stand to Sit Details cues and assist for controlled descent with sitting to mat table x2 reps, x1 to wheelchair.      Ambulation/Gait   Ambulation/Gait Yes    Ambulation/Gait Assistance 4: Min assist;3: Mod assist    Ambulation/Gait Assistance Details PTA/PT assist with pt for balance, walker management and right LE advancement/step placement for gait with  rehab tech bringing wheelchair behind for safety.    Ambulation Distance (Feet) 70 Feet   x1,  50 x1   Assistive device Rolling walker    Gait Pattern Step-to pattern;Decreased weight shift to right;Left flexed knee in stance;Right flexed knee in stance;Narrow base of support;Decreased step length - right;Decreased step length - left;Decreased stance time - right;Decreased hip/knee flexion - right    Ambulation Surface Level;Indoor      Therapeutic Activites    Therapeutic Activities Other Therapeutic Activities    Other Therapeutic Activities static standing at RW: 1st rep- 2 minutes 42.91 sec's with min guard assist initially downgrading to min assist for balance  with last ~30 seconds. cues for trunk/hip extension for tall posture with standing. 2cd rep- 3 minutes 5.75 sec's with min guard assist for safety, cues as stated above with 1st rep.      Knee/Hip Exercises: Supine   Short Arc Quad Sets Strengthening;AROM;10 reps;Both;1 set    Short Arc Quad Sets Limitations assist needed to achieve full knee extension with bil LE"s, left>right side    Bridges AROM;Strengthening;Both;1 set;10 reps;Limitations    Bridges Limitations assist to stabilize LE's into hooklying position with limited clearance of mat noted    Other Supine Knee/Hip Exercises bent knee fall outs x10 reps B, tactile cue on where to bring out leg and then bringing leg back slowly to midline, improved control with movements today after cues provided               PT Short Term Goals - 03/17/20 1238      PT SHORT TERM GOAL #1   Title Pt will be independent with initial HEP in order to build upon functional gains made in therapy. ALL STGS DUE 03/21/20    Baseline 03/17/20: met with current program per pt report    Status Achieved      PT SHORT TERM GOAL #2   Title Pt will perform sit <> stand with min A from standard height mat table surface in order to demo improved BLE strength and improved transfer ability.    Baseline 03/17/20: met in session today    Time --    Period --    Status Achieved      PT SHORT TERM GOAL #3   Title Pt will perform standing in RW for 3 minutes with supervision in order to demo improved functional weight bearing.    Baseline 03/17/20: met time with min guard assist for safety, no physical assistance needed    Time --    Period --    Status Achieved      PT SHORT TERM GOAL #4   Title Pt will ambulate at least 59' with min A in order to demo improved household mobility.    Baseline 03/17/20: 70 feet with min/mod assist of  2 (increased assist as pt fatigues), improved from eval just not to goal level    Time --    Period --    Status Partially Met              PT Long Term Goals - 02/22/20 1602      PT LONG TERM GOAL #1   Title Pt and spouse will be independent with final HEP for BLE stretching and strengthening. ALL LTGS DUE 04/18/20    Time 8    Period Weeks    Status New    Target Date 04/18/20      PT LONG TERM GOAL #2  Title Pt will tolerate static/dynamic standing activities with min guard in RW for at least 4 minutes in order to demo improved standing tolerance.    Time 8    Period Weeks    Status New      PT LONG TERM GOAL #3   Title Pt will perform stand pivot transfer with RW from w/c <> mat table with min A .    Baseline not yet assessed    Time 8    Period Weeks    Status New      PT LONG TERM GOAL #4   Title Pt will perform sit <> stand with RW with min guard in order to demo improved functional mobility.    Baseline currently needing min A from elevated mat table    Time 8    Period Weeks    Status New      PT LONG TERM GOAL #5   Title Pt will ambulate at least 115' with RW with min guard in order to demo improved functional mobility.    Baseline 24' with min A    Time 8    Period Weeks    Status New                 Plan - 03/17/20 1238    Clinical Impression Statement Today's skilled session focused on progress toward STGs. Pt has shown improvement in transfers as he now is min assist to stand from lower/standard height surfaces and with static standing at RW as he was able to stand for 3 minutes today with min guard assist. Pt also improving with gait distance, however continues to need 2 person assist for safety with wheelchair follow. The pt is progressing toward goals and should benefit from continued PT to progress toward unmet goals.    Personal Factors and Comorbidities Comorbidity 3+;Past/Current Experience    Comorbidities T8 paraplegia, diabetes, HTN    Examination-Activity Limitations Locomotion Level;Transfers;Stand    Examination-Participation Restrictions Community Activity;Yard  Work   Community education officer Evolving/Moderate complexity    Rehab Potential Good    PT Frequency 2x / week   1-2x   PT Duration 8 weeks   7-8 weeks   PT Treatment/Interventions Aquatic Therapy;Manual techniques;Therapeutic exercise;Gait training;Neuromuscular re-education;Orthotic Fit/Training    PT Next Visit Plan seated/supine strengthening. gait training with RW (needs close w/c follow). sit <> stands, pre gait activities.    PT Home Exercise Plan Access Code: [email or web address]    Consulted and Agree with Plan of Care Patient           Patient will benefit from skilled therapeutic intervention in order to improve the following deficits and impairments:  Decreased balance,Decreased coordination,Decreased range of motion,Decreased strength,Decreased mobility,Postural dysfunction,Impaired tone,Decreased activity tolerance,Abnormal gait,Decreased endurance,Difficulty walking,Impaired sensation  Visit Diagnosis: Muscle weakness (generalized)  Difficulty in walking, not elsewhere classified  Unsteadiness on feet  Other symptoms and signs involving the nervous system  Abnormal posture     Problem List Patient Active Problem List   Diagnosis Date Noted  . Erectile dysfunction due to diseases classified elsewhere 03/02/2020  . 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 Spasticity 08/03/2019  . Paraplegia ([place]) 07/07/2019  . Neurogenic bowel 07/07/2019  . Neurogenic bladder 07/07/2019  . 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

## 2020-03-22 ENCOUNTER — Ambulatory Visit: Payer: No Typology Code available for payment source | Admitting: Physical Therapy

## 2020-03-24 ENCOUNTER — Ambulatory Visit: Payer: BC Managed Care – PPO | Admitting: Physical Therapy

## 2020-03-25 ENCOUNTER — Encounter: Payer: Self-pay | Admitting: Physical Therapy

## 2020-03-25 ENCOUNTER — Other Ambulatory Visit: Payer: Self-pay

## 2020-03-25 ENCOUNTER — Ambulatory Visit: Payer: No Typology Code available for payment source | Attending: Family Medicine | Admitting: Physical Therapy

## 2020-03-25 DIAGNOSIS — G8222 Paraplegia, incomplete: Secondary | ICD-10-CM | POA: Diagnosis present

## 2020-03-25 DIAGNOSIS — R262 Difficulty in walking, not elsewhere classified: Secondary | ICD-10-CM | POA: Diagnosis present

## 2020-03-25 DIAGNOSIS — R29818 Other symptoms and signs involving the nervous system: Secondary | ICD-10-CM | POA: Diagnosis present

## 2020-03-25 DIAGNOSIS — R293 Abnormal posture: Secondary | ICD-10-CM

## 2020-03-25 DIAGNOSIS — M6281 Muscle weakness (generalized): Secondary | ICD-10-CM

## 2020-03-25 DIAGNOSIS — R2681 Unsteadiness on feet: Secondary | ICD-10-CM | POA: Diagnosis present

## 2020-03-27 NOTE — Therapy (Signed)
[facility] [address] [place], [name], [address] Phone: [phone]   Fax:  [phone]  Physical Therapy Treatment  Patient Details  Name: Timothy Davenport MRN: [id number] Date of Birth: [birth year] Referring Provider (PT): Juel Burrow    Encounter Date: 03/25/2020     03/25/20 1104  PT Visits / Re-Eval  Visit Number 9  Number of Visits 17  Date for PT Re-Evaluation 05/22/20 (60 day POC, 90 day cert)  Authorization  Authorization Type VA - 15 visits 02/05/20 - 06/04/20  Authorization - Visit Number 9  Authorization - Number of Visits 15  Progress Note Due on Visit 10  PT Time Calculation  PT Start Time 1102  PT Stop Time 1143  PT Time Calculation (min) 41 min  PT - End of Session  Equipment Utilized During Treatment Gait belt  Activity Tolerance Patient tolerated treatment well  Behavior During Therapy [facility] for tasks assessed/performed    Past Medical History:  Diagnosis Date  . Colon polyp   . Diabetes mellitus without complication ([place])   . Diabetic neuropathy ([place])   . HTN (hypertension)   . Patella fracture   . Renal calculi     Past Surgical History:  Procedure Laterality Date  . LUMBAR LAMINECTOMY/DECOMPRESSION MICRODISCECTOMY N/A 07/01/2019   Procedure: LUMBAR LAMINECTOMY/DECOMPRESSION MICRODISCECTOMY 1 LEVEL, THORACIC SIX-SEVEN;  Surgeon: Consuella Lose, MD;  Location: [facility];  Service: Neurosurgery;  Laterality: N/A;  LUMBAR LAMINECTOMY/DECOMPRESSION MICRODISCECTOMY 1 LEVEL, THORACIC SIX-SEVEN   . ORIF PATELLA    . THORACIC DISCECTOMY N/A 07/03/2019   Procedure: Evacuation of Thoracic Hematoma;  Surgeon: Consuella Lose, MD;  Location: [facility];  Service: Neurosurgery;  Laterality: N/A;    There were no vitals filed for this visit.      03/25/20 1104  Symptoms/Limitations  Subjective No now complaints. No falls or pain to report.  Pertinent History T8 paraplegia, diabetes, HTN   How long can you stand comfortably? probably a couple minutes with RW.  How long can you walk comfortably? approx. 5-10 minutes with his son and RW.  Patient Stated Goals wants to be able to walk without any assistance  Pain Assessment  Currently in Pain? No/denies       03/25/20 1105  Transfers  Transfers Sit to Stand;Stand to Sit;Lateral/Scoot Transfers  Sit to Stand 4: Min assist;With upper extremity assist;From bed;3: Mod assist  Sit to Stand Details Verbal cues for sequencing;Verbal cues for technique;Verbal cues for safe use of DME/AE;Manual facilitation for weight shifting  Sit to Stand Details (indicate cue type and reason) from low mat>RW x1, then from wheelchair>RW X3 x3 reps.  Stand to Sit 4: Min assist;With upper extremity assist;To bed  Stand to Sit Details (indicate cue type and reason) Verbal cues for sequencing;Verbal cues for technique;Verbal cues for safe use of DME/AE  Stand to Sit Details cues and assist each time for controlled descent with sitting down.  Lateral/Scoot Transfers 5: Supervision  Lateral/Scoot Transfer Details (indicate cue type and reason) wheelchair to mat table  Ambulation/Gait  Ambulation/Gait Yes  Ambulation/Gait Assistance 4: Min assist;3: Mod assist (with second person min guard for safety)  Ambulation/Gait Assistance Details one person assist for most of gait distance with second person stand by intitially to min assist as pt fatigued. cues needed for posture and for increased bil knee extension with gait. Pt with increased bil knee flexion with gait as he fatigues.  Ambulation Distance (Feet) 78 Feet (x1, 32 x1)  Assistive device Rolling  walker  Gait Pattern Step-to pattern;Decreased weight shift to right;Left flexed knee in stance;Right flexed knee in stance;Narrow base of support;Decreased step length - right;Decreased step length - left;Decreased stance time - right;Decreased hip/knee flexion - right  Ambulation Surface Level;Indoor   Knee/Hip Exercises: Seated  Long Arc Quad AROM;AAROM;Strengthening;Both;1 set;10 reps;Limitations  Marching AROM;AAROM;Strengthening;Both;1 set;10 reps;Limitations  Marching Limitations red band resistance with min AA on right needed for increased hip flexion  Long Arc Quad Limitations assist needed to achieve full range and for conrolled movements  Knee/Hip Exercises: Supine  Short Arc Quad Sets AROM;Strengthening;Both;2 sets;10 reps;Limitations  Short Arc [employer] Limitations assist needed for controlled movements  Bridges AROM;Strengthening;Both;1 set;10 reps;Limitations  Bridges Limitations assist to stabilize LE's into hooklying position with limited clearance of mat noted  Straight Leg Raises AROM;Strengthening;Both;1 set;10 reps;Limitations  Straight Leg Raises Limitations increased assistance needed with right>left LE for control of knee and movements          PT Short Term Goals - 03/17/20 1238      PT SHORT TERM GOAL #1   Title Pt will be independent with initial HEP in order to build upon functional gains made in therapy. ALL STGS DUE 03/21/20    Baseline 03/17/20: met with current program per pt report    Status Achieved      PT SHORT TERM GOAL #2   Title Pt will perform sit <> stand with min A from standard height mat table surface in order to demo improved BLE strength and improved transfer ability.    Baseline 03/17/20: met in session today    Time --    Period --    Status Achieved      PT SHORT TERM GOAL #3   Title Pt will perform standing in RW for 3 minutes with supervision in order to demo improved functional weight bearing.    Baseline 03/17/20: met time with min guard assist for safety, no physical assistance needed    Time --    Period --    Status Achieved      PT SHORT TERM GOAL #4   Title Pt will ambulate at least 10' with min A in order to demo improved household mobility.    Baseline 03/17/20: 70 feet with min/mod assist of  2 (increased assist as  pt fatigues), improved from eval just not to goal level    Time --    Period --    Status Partially Met             PT Long Term Goals - 02/22/20 1602      PT LONG TERM GOAL #1   Title Pt and spouse will be independent with final HEP for BLE stretching and strengthening. ALL LTGS DUE 04/18/20    Time 8    Period Weeks    Status New    Target Date 04/18/20      PT LONG TERM GOAL #2   Title Pt will tolerate static/dynamic standing activities with min guard in RW for at least 4 minutes in order to demo improved standing tolerance.    Time 8    Period Weeks    Status New      PT LONG TERM GOAL #3   Title Pt will perform stand pivot transfer with RW from w/c <> mat table with min A .    Baseline not yet assessed    Time 8    Period Weeks    Status New  PT LONG TERM GOAL #4   Title Pt will perform sit <> stand with RW with min guard in order to demo improved functional mobility.    Baseline currently needing min A from elevated mat table    Time 8    Period Weeks    Status New      PT LONG TERM GOAL #5   Title Pt will ambulate at least 115' with RW with min guard in order to demo improved functional mobility.    Baseline 75' with min A    Time 8    Period Weeks    Status New              03/25/20 1105  Plan  Clinical Impression Statement Today's skilled session continued to focus on strengthening, transfers and gait with RW. Pt progressed on one person assist (second min guard to min as pt fatigues) with gait vs having two person min/mod assist in previous sessions. The pt is progressing toward goals and should benefit from continued PT to progress toward unmet goals.  Personal Factors and Comorbidities Comorbidity 3+;Past/Current Experience  Comorbidities T8 paraplegia, diabetes, HTN  Examination-Activity Limitations Locomotion Level;Transfers;Stand  Examination-Participation Restrictions Community Activity;Yard Work (gardening)  Pt will benefit from skilled  therapeutic intervention in order to improve on the following deficits Decreased balance;Decreased coordination;Decreased range of motion;Decreased strength;Decreased mobility;Postural dysfunction;Impaired tone;Decreased activity tolerance;Abnormal gait;Decreased endurance;Difficulty walking;Impaired sensation  Stability/Clinical Decision Making Evolving/Moderate complexity  Rehab Potential Good  PT Frequency 2x / week (1-2x)  PT Duration 8 weeks (7-8 weeks)  PT Treatment/Interventions Aquatic Therapy;Manual techniques;Therapeutic exercise;Gait training;Neuromuscular re-education;Orthotic Fit/Training  PT Next Visit Plan seated/supine strengthening. gait training with RW (needs close w/c follow). sit <> stands, pre gait activities.  PT Home Exercise Plan Access Code: [email or web address]  Consulted and Agree with Plan of Care Patient         Patient will benefit from skilled therapeutic intervention in order to improve the following deficits and impairments:  Decreased balance,Decreased coordination,Decreased range of motion,Decreased strength,Decreased mobility,Postural dysfunction,Impaired tone,Decreased activity tolerance,Abnormal gait,Decreased endurance,Difficulty walking,Impaired sensation  Visit Diagnosis: Muscle weakness (generalized)  Difficulty in walking, not elsewhere classified  Unsteadiness on feet  Other symptoms and signs involving the nervous system  Abnormal posture     Problem List Patient Active Problem List   Diagnosis Date Noted  . Erectile dysfunction due to diseases classified elsewhere 03/02/2020  . 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 Spasticity 08/03/2019  . Paraplegia ([place]) 07/07/2019  . Neurogenic bowel 07/07/2019  . Neurogenic bladder 07/07/2019  . Thoracic myelopathy 06/30/2019    Willow Ora, PTA, [facility] Outpatient Neuro [facility] [address], [place], [place] [address] [phone] 03/27/20, 9:20 PM   Name: JAYSTEN ESSNER MRN:  [id number] Date of Birth: [birth year]

## 2020-03-29 ENCOUNTER — Ambulatory Visit: Payer: No Typology Code available for payment source | Admitting: Physical Therapy

## 2020-03-29 ENCOUNTER — Other Ambulatory Visit: Payer: Self-pay

## 2020-03-29 ENCOUNTER — Encounter: Payer: Self-pay | Admitting: Physical Therapy

## 2020-03-29 VITALS — BP 147/95 | HR 82

## 2020-03-29 DIAGNOSIS — R2681 Unsteadiness on feet: Secondary | ICD-10-CM

## 2020-03-29 DIAGNOSIS — M6281 Muscle weakness (generalized): Secondary | ICD-10-CM | POA: Diagnosis not present

## 2020-03-29 DIAGNOSIS — R29818 Other symptoms and signs involving the nervous system: Secondary | ICD-10-CM

## 2020-03-29 DIAGNOSIS — R293 Abnormal posture: Secondary | ICD-10-CM

## 2020-03-29 DIAGNOSIS — R262 Difficulty in walking, not elsewhere classified: Secondary | ICD-10-CM

## 2020-03-29 NOTE — Therapy (Signed)
[facility] [address] [place], [name], [address] Phone: [phone]   Fax:  [phone]  Physical Therapy Treatment/10th Visit Progress Note  Patient Details  Name: Timothy Davenport MRN: [id number] Date of Birth: 1951-02-02 Referring Provider (PT): Juel Burrow   10th Visit Physical Therapy Progress Note  Dates of Reporting Period: 02/22/20 to 03/29/20   Encounter Date: 03/29/2020   PT End of Session - 03/29/20 1417    Visit Number 10    Number of Visits 17    Date for PT Re-Evaluation 05/22/20   60 day POC, 90 day cert   Authorization Type VA - 15 visits 02/05/20 - 06/04/20    Authorization - Visit Number 10    Authorization - Number of Visits 15    Progress Note Due on Visit 10    PT Start Time 1103    PT Stop Time 1142    PT Time Calculation (min) 39 min    Equipment Utilized During Treatment Gait belt    Activity Tolerance Patient tolerated treatment well;Patient limited by fatigue    Behavior During Therapy WFL for tasks assessed/performed           Past Medical History:  Diagnosis Date  . Colon polyp   . Diabetes mellitus without complication ([place])   . Diabetic neuropathy ([place])   . HTN (hypertension)   . Patella fracture   . Renal calculi     Past Surgical History:  Procedure Laterality Date  . LUMBAR LAMINECTOMY/DECOMPRESSION MICRODISCECTOMY N/A 07/01/2019   Procedure: LUMBAR LAMINECTOMY/DECOMPRESSION MICRODISCECTOMY 1 LEVEL, THORACIC SIX-SEVEN;  Surgeon: Consuella Lose, MD;  Location: [facility];  Service: Neurosurgery;  Laterality: N/A;  LUMBAR LAMINECTOMY/DECOMPRESSION MICRODISCECTOMY 1 LEVEL, THORACIC SIX-SEVEN   . ORIF PATELLA    . THORACIC DISCECTOMY N/A 07/03/2019   Procedure: Evacuation of Thoracic Hematoma;  Surgeon: Consuella Lose, MD;  Location: [facility];  Service: Neurosurgery;  Laterality: N/A;    Vitals:   03/29/20 1127 03/29/20 1132  BP: (!) 157/101 (!) 147/95  Pulse: 94 82      Subjective Assessment - 03/29/20 1104    Subjective Going to have to reschedule appt in [place] in march or april due to transportation.    Pertinent History T8 paraplegia, diabetes, HTN    How long can you stand comfortably? probably a couple minutes with RW.    How long can you walk comfortably? approx. 5-10 minutes with his son and RW.    Patient Stated Goals wants to be able to walk without any assistance    Currently in Pain? No/denies                             [facility] Adult PT Treatment/Exercise - 03/29/20 1120      Transfers   Transfers Sit to Stand;Stand to Sit;Lateral/Scoot Transfers    Sit to Stand 4: Min assist;With upper extremity assist;From bed;3: Mod assist    Sit to Stand Details Verbal cues for sequencing;Verbal cues for technique;Verbal cues for safe use of DME/AE;Manual facilitation for weight shifting    Sit to Stand Details (indicate cue type and reason) from elevated mat table x3 reps - therapist needing to help steady RW and from w/c x2 reps    Stand to Sit With upper extremity assist;To bed;3: Mod assist;4: Min assist    Stand to Sit Details (indicate cue type and reason) Verbal cues for sequencing;Verbal cues for technique;Verbal cues for safe  use of DME/AE    Stand to Sit Details assist for controlled descent, esp back to w/c after gait, cues to reach posteriorly and hinge from hips. mod A needed after bouts 2 and 3 of gait    Lateral/Scoot Transfers 5: Supervision    Lateral/Scoot Transfer Details (indicate cue type and reason) w/c <> mat table    Comments prior to gait: x2 reps standing for 1 minute at RW with min guard/min A, verbal and tactile cues for B knee extension      Ambulation/Gait   Ambulation/Gait Yes    Ambulation/Gait Assistance 4: Min assist;3: Mod assist    Ambulation/Gait Assistance Details total distance of 170' today. 2 person assist - beginning with min A and progressing towards mod A with pt more fatigued. w/c follow  for safety and needed for seated rest break. verbal and tacitile cues for posture and knee extension in stance. therapist needing to help with RLE foot placement when more fatigued. pt having more narrow BOS and getting LLE stuck behind RLE at times    Ambulation Distance (Feet) 75 Feet   x1, 40 x 1, 55 x 1   Assistive device Rolling walker    Gait Pattern Step-to pattern;Decreased weight shift to right;Left flexed knee in stance;Right flexed knee in stance;Narrow base of support;Decreased step length - right;Decreased step length - left;Decreased stance time - right;Decreased hip/knee flexion - right    Ambulation Surface Level;Indoor      Knee/Hip Exercises: Seated   Long Arc Quad AROM;AAROM;Strengthening;Both;10 reps;Limitations;2 sets    [employer] Limitations assist needed to achieve full range and for controlled movements    Abduction/Adduction  Strengthening;AROM;Both;10 reps;1 set    Abd/Adduction Limitations isometric with gait belt around thighs                    PT Short Term Goals - 03/17/20 1238      PT SHORT TERM GOAL #1   Title Pt will be independent with initial HEP in order to build upon functional gains made in therapy. ALL STGS DUE 03/21/20    Baseline 03/17/20: met with current program per pt report    Status Achieved      PT SHORT TERM GOAL #2   Title Pt will perform sit <> stand with min A from standard height mat table surface in order to demo improved BLE strength and improved transfer ability.    Baseline 03/17/20: met in session today    Time --    Period --    Status Achieved      PT SHORT TERM GOAL #3   Title Pt will perform standing in RW for 3 minutes with supervision in order to demo improved functional weight bearing.    Baseline 03/17/20: met time with min guard assist for safety, no physical assistance needed    Time --    Period --    Status Achieved      PT SHORT TERM GOAL #4   Title Pt will ambulate at least 68' with min A in order to  demo improved household mobility.    Baseline 03/17/20: 70 feet with min/mod assist of  2 (increased assist as pt fatigues), improved from eval just not to goal level    Time --    Period --    Status Partially Met             PT Long Term Goals - 02/22/20 7017  PT LONG TERM GOAL #1   Title Pt and spouse will be independent with final HEP for BLE stretching and strengthening. ALL LTGS DUE 04/18/20    Time 8    Period Weeks    Status New    Target Date 04/18/20      PT LONG TERM GOAL #2   Title Pt will tolerate static/dynamic standing activities with min guard in RW for at least 4 minutes in order to demo improved standing tolerance.    Time 8    Period Weeks    Status New      PT LONG TERM GOAL #3   Title Pt will perform stand pivot transfer with RW from w/c <> mat table with min A .    Baseline not yet assessed    Time 8    Period Weeks    Status New      PT LONG TERM GOAL #4   Title Pt will perform sit <> stand with RW with min guard in order to demo improved functional mobility.    Baseline currently needing min A from elevated mat table    Time 8    Period Weeks    Status New      PT LONG TERM GOAL #5   Title Pt will ambulate at least 115' with RW with min guard in order to demo improved functional mobility.    Baseline 106' with min A    Time 8    Period Weeks    Status New                 Plan - 03/29/20 1423    Clinical Impression Statement Pt improved total gait distance today to 170' (in 3 different bouts with seated rest break in between each). Pt needing assist of 2 people today beginning with min A and needing mod A when pt fatigues. Pt with more narrow BOS today at times, esp with RLE and getting LLE caught behind R at times. Pt with elevated BP after gait today (pt asymptomatic), but decr back [facility] for therapy with seated rest break. Pt is making great progress, will continue to progress towards LTGs.    Personal Factors and Comorbidities  Comorbidity 3+;Past/Current Experience    Comorbidities T8 paraplegia, diabetes, HTN    Examination-Activity Limitations Locomotion Level;Transfers;Stand    Examination-Participation Restrictions Community Activity;Yard Work   Community education officer Evolving/Moderate complexity    Rehab Potential Good    PT Frequency 2x / week   1-2x   PT Duration 8 weeks   7-8 weeks   PT Treatment/Interventions Aquatic Therapy;Manual techniques;Therapeutic exercise;Gait training;Neuromuscular re-education;Orthotic Fit/Training    PT Next Visit Plan seated/supine strengthening. gait training with RW (needs close w/c follow). sit <> stands, pre gait activities. SciFit, will need to reuest more visits soon.    PT Home Exercise Plan Access Code: [email or web address]    Consulted and Agree with Plan of Care Patient           Patient will benefit from skilled therapeutic intervention in order to improve the following deficits and impairments:  Decreased balance,Decreased coordination,Decreased range of motion,Decreased strength,Decreased mobility,Postural dysfunction,Impaired tone,Decreased activity tolerance,Abnormal gait,Decreased endurance,Difficulty walking,Impaired sensation  Visit Diagnosis: Muscle weakness (generalized)  Difficulty in walking, not elsewhere classified  Unsteadiness on feet  Other symptoms and signs involving the nervous system  Abnormal posture     Problem List Patient Active Problem List   Diagnosis Date Noted  . Erectile dysfunction due  to diseases classified elsewhere 03/02/2020  . 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 Spasticity 08/03/2019  . Paraplegia ([place]) 07/07/2019  . Neurogenic bowel 07/07/2019  . Neurogenic bladder 07/07/2019  . 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 [place], [name], [address] Phone: [phone]    Fax:  [phone]  Name: AKSHAR STARNES MRN: [id number] Date of Birth: 11/06/1950

## 2020-03-31 ENCOUNTER — Encounter: Payer: Self-pay | Admitting: Physical Therapy

## 2020-03-31 ENCOUNTER — Other Ambulatory Visit: Payer: Self-pay

## 2020-03-31 ENCOUNTER — Ambulatory Visit: Payer: No Typology Code available for payment source | Admitting: Physical Therapy

## 2020-03-31 DIAGNOSIS — M6281 Muscle weakness (generalized): Secondary | ICD-10-CM

## 2020-03-31 DIAGNOSIS — R29818 Other symptoms and signs involving the nervous system: Secondary | ICD-10-CM

## 2020-03-31 DIAGNOSIS — R262 Difficulty in walking, not elsewhere classified: Secondary | ICD-10-CM

## 2020-03-31 DIAGNOSIS — R293 Abnormal posture: Secondary | ICD-10-CM

## 2020-03-31 DIAGNOSIS — G8222 Paraplegia, incomplete: Secondary | ICD-10-CM

## 2020-03-31 DIAGNOSIS — R2681 Unsteadiness on feet: Secondary | ICD-10-CM

## 2020-04-01 NOTE — Therapy (Signed)
[facility] [address] [place], [name], [address] Phone: [phone]   Fax:  [phone]  Physical Therapy Treatment  Patient Details  Name: Timothy Davenport MRN: [id number] Date of Birth: 05-04-1950 Referring Provider (PT): Juel Burrow    Encounter Date: 03/31/2020   PT End of Session - 03/31/20 1022    Visit Number 11    Number of Visits 17    Date for PT Re-Evaluation 05/22/20   60 day POC, 90 day cert   Authorization Type VA - 15 visits 02/05/20 - 06/04/20    Authorization - Visit Number 11    Authorization - Number of Visits 15    Progress Note Due on Visit 10    PT Start Time 1019    PT Stop Time 1100    PT Time Calculation (min) 41 min    Equipment Utilized During Treatment Gait belt    Activity Tolerance Patient tolerated treatment well;Patient limited by fatigue    Behavior During Therapy WFL for tasks assessed/performed           Past Medical History:  Diagnosis Date  . Colon polyp   . Diabetes mellitus without complication ([place])   . Diabetic neuropathy ([place])   . HTN (hypertension)   . Patella fracture   . Renal calculi     Past Surgical History:  Procedure Laterality Date  . LUMBAR LAMINECTOMY/DECOMPRESSION MICRODISCECTOMY N/A 07/01/2019   Procedure: LUMBAR LAMINECTOMY/DECOMPRESSION MICRODISCECTOMY 1 LEVEL, THORACIC SIX-SEVEN;  Surgeon: Consuella Lose, MD;  Location: [facility];  Service: Neurosurgery;  Laterality: N/A;  LUMBAR LAMINECTOMY/DECOMPRESSION MICRODISCECTOMY 1 LEVEL, THORACIC SIX-SEVEN   . ORIF PATELLA    . THORACIC DISCECTOMY N/A 07/03/2019   Procedure: Evacuation of Thoracic Hematoma;  Surgeon: Consuella Lose, MD;  Location: [facility];  Service: Neurosurgery;  Laterality: N/A;    There were no vitals filed for this visit.   Subjective Assessment - 03/31/20 1021    Subjective No new complaints. No falls or pain to report. HEP is going well. Has rescheduled appt in [place] to  late March.    Pertinent History T8 paraplegia, diabetes, HTN    How long can you stand comfortably? probably a couple minutes with RW.    How long can you walk comfortably? approx. 5-10 minutes with his son and RW.    Patient Stated Goals wants to be able to walk without any assistance    Currently in Pain? No/denies               [facility] Adult PT Treatment/Exercise - 03/31/20 1023      Transfers   Lateral/Scoot Transfers 5: Supervision    Lateral/Scoot Transfer Details (indicate cue type and reason) wheelchair<>mat table      Knee/Hip Exercises: Seated   Long Arc Quad AROM;AAROM;Strengthening;Both;1 set;10 reps;Limitations    Long [employer] Limitations assist needed to achieve full range and for controlled movements    Hamstring Curl AROM;Strengthening;Both;1 set;10 reps;Limitations    Hamstring Limitations with foot on pillowcase to allow it to slide on floor with red band resistance on left LE and yellow band resistance on right LE for 10 reps each. cues with occaisonal assist for slow, controlled movements.      Knee/Hip Exercises: Supine   Short Arc Quad Sets AROM;Strengthening;Both;2 sets;10 reps;Limitations    Short Arc Quad Sets Limitations cues for slow lowering and full knee extension    Heel Slides AROM;AAROM;Strengthening;Both;2 sets;10 reps;Limitations    Heel Slides Limitations with foot on  pillowcase- pt sliding activly as far as able with PTA then assisting for controlled extension as needed right> left LE.    Bridges AROM;Strengthening;Both;1 set;10 reps;Limitations    [employer] Limitations assist to stabilize LE's into hooklying position with limited clearance of mat noted    Other Supine Knee/Hip Exercises with red pball under LE's at knees- bridging with arms on mat at sides for 2 sets of 10 reps (in additon to bridges done with feet on mat table). assit for ball stability with increased clearance of mat noted. then with ball positioned at feet- hamstring curls for 2  sets of 10 reps with assist to controll ball and for full hip/knee flexion.               PT Short Term Goals - 03/17/20 1238      PT SHORT TERM GOAL #1   Title Pt will be independent with initial HEP in order to build upon functional gains made in therapy. ALL STGS DUE 03/21/20    Baseline 03/17/20: met with current program per pt report    Status Achieved      PT SHORT TERM GOAL #2   Title Pt will perform sit <> stand with min A from standard height mat table surface in order to demo improved BLE strength and improved transfer ability.    Baseline 03/17/20: met in session today    Time --    Period --    Status Achieved      PT SHORT TERM GOAL #3   Title Pt will perform standing in RW for 3 minutes with supervision in order to demo improved functional weight bearing.    Baseline 03/17/20: met time with min guard assist for safety, no physical assistance needed    Time --    Period --    Status Achieved      PT SHORT TERM GOAL #4   Title Pt will ambulate at least 74' with min A in order to demo improved household mobility.    Baseline 03/17/20: 70 feet with min/mod assist of  2 (increased assist as pt fatigues), improved from eval just not to goal level    Time --    Period --    Status Partially Met             PT Long Term Goals - 02/22/20 1602      PT LONG TERM GOAL #1   Title Pt and spouse will be independent with final HEP for BLE stretching and strengthening. ALL LTGS DUE 04/18/20    Time 8    Period Weeks    Status New    Target Date 04/18/20      PT LONG TERM GOAL #2   Title Pt will tolerate static/dynamic standing activities with min guard in RW for at least 4 minutes in order to demo improved standing tolerance.    Time 8    Period Weeks    Status New      PT LONG TERM GOAL #3   Title Pt will perform stand pivot transfer with RW from w/c <> mat table with min A .    Baseline not yet assessed    Time 8    Period Weeks    Status New      PT LONG TERM  GOAL #4   Title Pt will perform sit <> stand with RW with min guard in order to demo improved functional mobility.    Baseline currently needing min  A from elevated mat table    Time 8    Period Weeks    Status New      PT LONG TERM GOAL #5   Title Pt will ambulate at least 115' with RW with min guard in order to demo improved functional mobility.    Baseline 58' with min A    Time 8    Period Weeks    Status New                 Plan - 03/31/20 1023    Clinical Impression Statement Today's skilled session focused on LE strengthening in supine and sitting with rest breaks taken a needed due to fatigue. No other issues noted or reported in session. The pt is progressing toward goals and should benefit from continued PT to progress toward unmet goals.    Personal Factors and Comorbidities Comorbidity 3+;Past/Current Experience    Comorbidities T8 paraplegia, diabetes, HTN    Examination-Activity Limitations Locomotion Level;Transfers;Stand    Examination-Participation Restrictions Community Activity;Yard Work   Community education officer Evolving/Moderate complexity    Rehab Potential Good    PT Frequency 2x / week   1-2x   PT Duration 8 weeks   7-8 weeks   PT Treatment/Interventions Aquatic Therapy;Manual techniques;Therapeutic exercise;Gait training;Neuromuscular re-education;Orthotic Fit/Training    PT Next Visit Plan seated/supine strengthening. gait training with RW (needs close w/c follow). sit <> stands, pre gait activities. SciFit, will need to request more visits soon.    PT Home Exercise Plan Access Code: [email or web address]    Consulted and Agree with Plan of Care Patient           Patient will benefit from skilled therapeutic intervention in order to improve the following deficits and impairments:  Decreased balance,Decreased coordination,Decreased range of motion,Decreased strength,Decreased mobility,Postural dysfunction,Impaired tone,Decreased activity  tolerance,Abnormal gait,Decreased endurance,Difficulty walking,Impaired sensation  Visit Diagnosis: Muscle weakness (generalized)  Difficulty in walking, not elsewhere classified  Unsteadiness on feet  Other symptoms and signs involving the nervous system  Abnormal posture  Paraplegia, incomplete ([place])     Problem List Patient Active Problem List   Diagnosis Date Noted  . Erectile dysfunction due to diseases classified elsewhere 03/02/2020  . 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 Spasticity 08/03/2019  . Paraplegia ([place]) 07/07/2019  . Neurogenic bowel 07/07/2019  . Neurogenic bladder 07/07/2019  . Thoracic myelopathy 06/30/2019    Willow Ora, PTA, [facility] [address], [place], [place] [address] [phone] 04/01/20, 4:44 PM   Name: Timothy Davenport MRN: [id number] Date of Birth: Mar 27, 1950

## 2020-04-05 ENCOUNTER — Other Ambulatory Visit: Payer: Self-pay

## 2020-04-05 ENCOUNTER — Ambulatory Visit: Payer: No Typology Code available for payment source | Admitting: Physical Therapy

## 2020-04-05 ENCOUNTER — Encounter: Payer: Self-pay | Admitting: Physical Therapy

## 2020-04-05 DIAGNOSIS — R2681 Unsteadiness on feet: Secondary | ICD-10-CM

## 2020-04-05 DIAGNOSIS — M6281 Muscle weakness (generalized): Secondary | ICD-10-CM | POA: Diagnosis not present

## 2020-04-05 DIAGNOSIS — R29818 Other symptoms and signs involving the nervous system: Secondary | ICD-10-CM

## 2020-04-05 DIAGNOSIS — R262 Difficulty in walking, not elsewhere classified: Secondary | ICD-10-CM

## 2020-04-05 NOTE — Therapy (Signed)
[facility] [address] [place] [address] [place], [name], [address] Phone: [phone]   Fax:  [phone]  Physical Therapy Treatment  Patient Details  Name: Timothy Davenport MRN: [id number] Date of Birth: Aug 10, 1950 Referring Provider (PT): Juel Burrow    Encounter Date: 04/05/2020   PT End of Session - 04/05/20 1420    Visit Number 12    Number of Visits 17    Date for PT Re-Evaluation 05/22/20   60 day POC, 90 day cert   Authorization Type VA - 15 visits 02/05/20 - 06/04/20    Authorization - Visit Number 12    Authorization - Number of Visits 15    Progress Note Due on Visit 10    PT Start Time 1101    PT Stop Time 1143    PT Time Calculation (min) 42 min    Equipment Utilized During Treatment Gait belt    Activity Tolerance Patient tolerated treatment well;Patient limited by fatigue    Behavior During Therapy WFL for tasks assessed/performed           Past Medical History:  Diagnosis Date  . Colon polyp   . Diabetes mellitus without complication ([place])   . Diabetic neuropathy ([place])   . HTN (hypertension)   . Patella fracture   . Renal calculi     Past Surgical History:  Procedure Laterality Date  . LUMBAR LAMINECTOMY/DECOMPRESSION MICRODISCECTOMY N/A 07/01/2019   Procedure: LUMBAR LAMINECTOMY/DECOMPRESSION MICRODISCECTOMY 1 LEVEL, THORACIC SIX-SEVEN;  Surgeon: Consuella Lose, MD;  Location: [facility];  Service: Neurosurgery;  Laterality: N/A;  LUMBAR LAMINECTOMY/DECOMPRESSION MICRODISCECTOMY 1 LEVEL, THORACIC SIX-SEVEN   . ORIF PATELLA    . THORACIC DISCECTOMY N/A 07/03/2019   Procedure: Evacuation of Thoracic Hematoma;  Surgeon: Consuella Lose, MD;  Location: [facility];  Service: Neurosurgery;  Laterality: N/A;    There were no vitals filed for this visit.   Subjective Assessment - 04/05/20 1104    Subjective Feels like his right leg is getting stronger. Reports BP was 126/83 this morning and blood glucose is  doing better.    Pertinent History T8 paraplegia, diabetes, HTN    How long can you stand comfortably? probably a couple minutes with RW.    How long can you walk comfortably? approx. 5-10 minutes with his son and RW.    Patient Stated Goals wants to be able to walk without any assistance    Currently in Pain? No/denies                             [facility] Adult PT Treatment/Exercise - 04/05/20 1122      Transfers   Transfers Sit to Stand;Stand to Sit;Lateral/Scoot Transfers    Sit to Stand With upper extremity assist;From bed;3: Mod assist    Sit to Stand Details Verbal cues for sequencing;Verbal cues for technique;Verbal cues for safe use of DME/AE;Manual facilitation for weight shifting    Sit to Stand Details (indicate cue type and reason) back to low w/c cues to reach back, cues for hip hinge, PT tech helping to steady RW    Stand to Sit With upper extremity assist;To bed;4: Min assist    Stand to Sit Details (indicate cue type and reason) Verbal cues for sequencing;Verbal cues for technique;Verbal cues for safe use of DME/AE    Stand to Sit Details from w/c x2 reps for gait with min A to stand and PT tech assisting by steadying RW  Lateral/Scoot Transfers 5: Supervision    Lateral/Scoot Transfer Details (indicate cue type and reason) w/c <> mat table      Ambulation/Gait   Ambulation/Gait Yes    Ambulation/Gait Assistance 4: Min assist;3: Mod assist    Ambulation/Gait Assistance Details beginning with single person assist of min A and progressing to single person assist of mod A. verbal and tactile cues for B knee extension and posture during stance. pt taking too narrow of a step with RLE at times. pt with incr B knee flexion when more fatigued and forward flexed posture.    Ambulation Distance (Feet) 89 Feet   x1, 36 x 1   Assistive device Rolling walker    Gait Pattern Step-to pattern;Decreased weight shift to right;Left flexed knee in stance;Right flexed knee in  stance;Narrow base of support;Decreased step length - right;Decreased step length - left;Decreased stance time - right;Decreased hip/knee flexion - right    Ambulation Surface Level;Indoor    Gait Comments BP = 143/93 after gait      Knee/Hip Exercises: Aerobic   Stepper Scifit with BLE only at level 2.5 for 6 minutes, needing mod A of therapist to help prevent w/c from tipping with wedges under wheels and anti tippers down as well.      Knee/Hip Exercises: Supine   Bridges AROM;Strengthening;Both;1 set;10 reps;Limitations    Bridges Limitations assist to stabilize LE's into hooklying position    Other Supine Knee/Hip Exercises bent knee fall outs 2 x 5 reps B with yellow tband, cues for slowed and controlled                    PT Short Term Goals - 03/17/20 1238      PT SHORT TERM GOAL #1   Title Pt will be independent with initial HEP in order to build upon functional gains made in therapy. ALL STGS DUE 03/21/20    Baseline 03/17/20: met with current program per pt report    Status Achieved      PT SHORT TERM GOAL #2   Title Pt will perform sit <> stand with min A from standard height mat table surface in order to demo improved BLE strength and improved transfer ability.    Baseline 03/17/20: met in session today    Time --    Period --    Status Achieved      PT SHORT TERM GOAL #3   Title Pt will perform standing in RW for 3 minutes with supervision in order to demo improved functional weight bearing.    Baseline 03/17/20: met time with min guard assist for safety, no physical assistance needed    Time --    Period --    Status Achieved      PT SHORT TERM GOAL #4   Title Pt will ambulate at least 64' with min A in order to demo improved household mobility.    Baseline 03/17/20: 70 feet with min/mod assist of  2 (increased assist as pt fatigues), improved from eval just not to goal level    Time --    Period --    Status Partially Met             PT Long Term  Goals - 02/22/20 1602      PT LONG TERM GOAL #1   Title Pt and spouse will be independent with final HEP for BLE stretching and strengthening. ALL LTGS DUE 04/18/20    Time 8    Period  Weeks    Status New    Target Date 04/18/20      PT LONG TERM GOAL #2   Title Pt will tolerate static/dynamic standing activities with min guard in RW for at least 4 minutes in order to demo improved standing tolerance.    Time 8    Period Weeks    Status New      PT LONG TERM GOAL #3   Title Pt will perform stand pivot transfer with RW from w/c <> mat table with min A .    Baseline not yet assessed    Time 8    Period Weeks    Status New      PT LONG TERM GOAL #4   Title Pt will perform sit <> stand with RW with min guard in order to demo improved functional mobility.    Baseline currently needing min A from elevated mat table    Time 8    Period Weeks    Status New      PT LONG TERM GOAL #5   Title Pt will ambulate at least 115' with RW with min guard in order to demo improved functional mobility.    Baseline 46' with min A    Time 8    Period Weeks    Status New                 Plan - 04/05/20 1422    Clinical Impression Statement Performed gait today with min A and then mod A of 1 person today with PT tech providing supervision and additional PT tech providing w/c follow when pt fatigues. Remainder of session focused on BLE strengthening, pt tolerated session well - will continue to progress towards LTGs.    Personal Factors and Comorbidities Comorbidity 3+;Past/Current Experience    Comorbidities T8 paraplegia, diabetes, HTN    Examination-Activity Limitations Locomotion Level;Transfers;Stand    Examination-Participation Restrictions Community Activity;Yard Work   Community education officer Evolving/Moderate complexity    Rehab Potential Good    PT Frequency 2x / week   1-2x   PT Duration 8 weeks   7-8 weeks   PT Treatment/Interventions Aquatic  Therapy;Manual techniques;Therapeutic exercise;Gait training;Neuromuscular re-education;Orthotic Fit/Training    PT Next Visit Plan seated/supine strengthening. gait training with RW (needs close w/c follow). sit <> stands, pre gait activities. SciFit, will need to request more visits soon.    PT Home Exercise Plan Access Code: [email or web address]    Consulted and Agree with Plan of Care Patient           Patient will benefit from skilled therapeutic intervention in order to improve the following deficits and impairments:  Decreased balance,Decreased coordination,Decreased range of motion,Decreased strength,Decreased mobility,Postural dysfunction,Impaired tone,Decreased activity tolerance,Abnormal gait,Decreased endurance,Difficulty walking,Impaired sensation  Visit Diagnosis: Muscle weakness (generalized)  Difficulty in walking, not elsewhere classified  Unsteadiness on feet  Other symptoms and signs involving the nervous system     Problem List Patient Active Problem List   Diagnosis Date Noted  . Erectile dysfunction due to diseases classified elsewhere 03/02/2020  . 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 Spasticity 08/03/2019  . Paraplegia ([place]) 07/07/2019  . Neurogenic bowel 07/07/2019  . Neurogenic bladder 07/07/2019  . 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 [place], [name], [address] Phone: [phone]   Fax:  [phone]  Name: Timothy Davenport MRN: [id number] Date of  Birth: [birth year]

## 2020-04-07 ENCOUNTER — Encounter: Payer: Self-pay | Admitting: Physical Therapy

## 2020-04-07 ENCOUNTER — Other Ambulatory Visit: Payer: Self-pay

## 2020-04-07 ENCOUNTER — Ambulatory Visit: Payer: No Typology Code available for payment source | Admitting: Physical Therapy

## 2020-04-07 DIAGNOSIS — R29818 Other symptoms and signs involving the nervous system: Secondary | ICD-10-CM

## 2020-04-07 DIAGNOSIS — R2681 Unsteadiness on feet: Secondary | ICD-10-CM

## 2020-04-07 DIAGNOSIS — R262 Difficulty in walking, not elsewhere classified: Secondary | ICD-10-CM

## 2020-04-07 DIAGNOSIS — M6281 Muscle weakness (generalized): Secondary | ICD-10-CM

## 2020-04-07 DIAGNOSIS — R293 Abnormal posture: Secondary | ICD-10-CM

## 2020-04-08 NOTE — Therapy (Signed)
[facility] [address] [place], [name], [address] Phone: [phone]   Fax:  [phone]  Physical Therapy Treatment  Patient Details  Name: Timothy Davenport MRN: [id number] Date of Birth: May 24, 1950 Referring Provider (PT): Juel Burrow    Encounter Date: 04/07/2020   PT End of Session - 04/07/20 1106    Visit Number 13    Number of Visits 17    Date for PT Re-Evaluation 05/22/20   60 day POC, 90 day cert   Authorization Type VA - 15 visits 02/05/20 - 06/04/20    Authorization - Visit Number 13    Authorization - Number of Visits 15    Progress Note Due on Visit 20    PT Start Time 1101    PT Stop Time 1141    PT Time Calculation (min) 40 min    Equipment Utilized During Treatment Gait belt    Activity Tolerance Patient tolerated treatment well;Patient limited by fatigue    Behavior During Therapy [facility] for tasks assessed/performed           Past Medical History:  Diagnosis Date  . Colon polyp   . Diabetes mellitus without complication ([place])   . Diabetic neuropathy ([place])   . HTN (hypertension)   . Patella fracture   . Renal calculi     Past Surgical History:  Procedure Laterality Date  . LUMBAR LAMINECTOMY/DECOMPRESSION MICRODISCECTOMY N/A 07/01/2019   Procedure: LUMBAR LAMINECTOMY/DECOMPRESSION MICRODISCECTOMY 1 LEVEL, THORACIC SIX-SEVEN;  Surgeon: Consuella Lose, MD;  Location: [facility];  Service: Neurosurgery;  Laterality: N/A;  LUMBAR LAMINECTOMY/DECOMPRESSION MICRODISCECTOMY 1 LEVEL, THORACIC SIX-SEVEN   . ORIF PATELLA    . THORACIC DISCECTOMY N/A 07/03/2019   Procedure: Evacuation of Thoracic Hematoma;  Surgeon: Consuella Lose, MD;  Location: [facility];  Service: Neurosurgery;  Laterality: N/A;    There were no vitals filed for this visit.   Subjective Assessment - 04/07/20 1105    Subjective No new complaints. No falls or pain to report. Was tired after last session, no other issues    How  long can you stand comfortably? probably a couple minutes with RW.    How long can you walk comfortably? approx. 5-10 minutes with his son and RW.    Patient Stated Goals wants to be able to walk without any assistance    Currently in Pain? No/denies               [facility] Adult PT Treatment/Exercise - 04/07/20 1108      Transfers   Transfers Sit to Stand;Stand to Sit;Lateral/Scoot Transfers    Sit to Stand With upper extremity assist;From bed;3: Mod assist    Sit to Stand Details Verbal cues for technique;Verbal cues for precautions/safety;Verbal cues for safe use of DME/AE    Sit to Stand Details (indicate cue type and reason) low mat >RW with cues for increased weight shifting    Stand to Sit 4: Min assist;With upper extremity assist;To bed    Stand to Sit Details (indicate cue type and reason) Verbal cues for sequencing;Verbal cues for technique;Verbal cues for safe use of DME/AE    Stand to Sit Details from RW to mat table with cues to reach back with one UE to assist with controlled descent    Lateral/Scoot Transfers 6: Modified independent (Device/Increase time)    Lateral/Scoot Transfer Details (indicate cue type and reason) w/c<>mat table      Ambulation/Gait   Ambulation/Gait Yes    Ambulation/Gait Assistance 4:  Min guard;4: Min assist;3: Mod assist   with second person min guard, plus wheelchair follow by rehab tech   Ambulation/Gait Assistance Details one person assist with min assist initially needed, increasing to mod assist due to fatigue with last ~40-50 feet, second person stand by assist with one episode of min assist needed due to left LE being caught behind right LE and pt unable to self correct. Rehab tech following with w/c, however not needed this session.    Ambulation Distance (Feet) 115 Feet   x1   Assistive device Rolling walker    Gait Pattern Step-to pattern;Decreased weight shift to right;Left flexed knee in stance;Right flexed knee in stance;Narrow base of  support;Decreased step length - right;Decreased step length - left;Decreased stance time - right;Decreased hip/knee flexion - right;Step-through pattern    Ambulation Surface Level;Indoor      Knee/Hip Exercises: Aerobic   Stepper Scifit with BLE only at level 2.7 for 6 minutes, needing mod A of therapist to help prevent w/c from tipping with wedges under wheels and anti -tippers down as well.      Knee/Hip Exercises: Supine   Bridges AROM;Strengthening;Both;1 set;10 reps;Limitations    Bridges Limitations assist to stabilize LE's into hooklying position    Other Supine Knee/Hip Exercises bent knee fall outs x10 reps B with yellow tband, cues for slowed and controlled movements    Other Supine Knee/Hip Exercises with red pball- under LE"s for bridging x10 reps with arms at sides, assist to stabilize LE's on ball. then with ball at feet- hamstring curls with assist to stabilize feet on ball for 10 reps.                 PT Short Term Goals - 03/17/20 1238      PT SHORT TERM GOAL #1   Title Pt will be independent with initial HEP in order to build upon functional gains made in therapy. ALL STGS DUE 03/21/20    Baseline 03/17/20: met with current program per pt report    Status Achieved      PT SHORT TERM GOAL #2   Title Pt will perform sit <> stand with min A from standard height mat table surface in order to demo improved BLE strength and improved transfer ability.    Baseline 03/17/20: met in session today    Time --    Period --    Status Achieved      PT SHORT TERM GOAL #3   Title Pt will perform standing in RW for 3 minutes with supervision in order to demo improved functional weight bearing.    Baseline 03/17/20: met time with min guard assist for safety, no physical assistance needed    Time --    Period --    Status Achieved      PT SHORT TERM GOAL #4   Title Pt will ambulate at least 79' with min A in order to demo improved household mobility.    Baseline 03/17/20: 70  feet with min/mod assist of  2 (increased assist as pt fatigues), improved from eval just not to goal level    Time --    Period --    Status Partially Met             PT Long Term Goals - 02/22/20 1602      PT LONG TERM GOAL #1   Title Pt and spouse will be independent with final HEP for BLE stretching and strengthening. ALL LTGS DUE 04/18/20  Time 8    Period Weeks    Status New    Target Date 04/18/20      PT LONG TERM GOAL #2   Title Pt will tolerate static/dynamic standing activities with min guard in RW for at least 4 minutes in order to demo improved standing tolerance.    Time 8    Period Weeks    Status New      PT LONG TERM GOAL #3   Title Pt will perform stand pivot transfer with RW from w/c <> mat table with min A .    Baseline not yet assessed    Time 8    Period Weeks    Status New      PT LONG TERM GOAL #4   Title Pt will perform sit <> stand with RW with min guard in order to demo improved functional mobility.    Baseline currently needing min A from elevated mat table    Time 8    Period Weeks    Status New      PT LONG TERM GOAL #5   Title Pt will ambulate at least 115' with RW with min guard in order to demo improved functional mobility.    Baseline 1' with min A    Time 8    Period Weeks    Status New                 Plan - 04/07/20 1108    Clinical Impression Statement Today's skilled session continued to focus on strengthening and gait with RW. Pt able to complete an entire lap today with one person assist, second min guard for safety with wheelchair follow. The pt is making steady progress toward goals and should benefit from continued PT to progress toward unmet goals.    Personal Factors and Comorbidities Comorbidity 3+;Past/Current Experience    Comorbidities T8 paraplegia, diabetes, HTN    Examination-Activity Limitations Locomotion Level;Transfers;Stand    Examination-Participation Restrictions Community Activity;Yard Work    Community education officer Evolving/Moderate complexity    Rehab Potential Good    PT Frequency 2x / week   1-2x   PT Duration 8 weeks   7-8 weeks   PT Treatment/Interventions Aquatic Therapy;Manual techniques;Therapeutic exercise;Gait training;Neuromuscular re-education;Orthotic Fit/Training    PT Next Visit Plan seated/supine strengthening. gait training with RW (needs close w/c follow). sit <> stands, pre gait activities. SciFit, will need to request more visits soon ([facility] has asked Pearline Cables to request)    PT Home Exercise Plan Access Code: [email or web address]    Consulted and Agree with Plan of Care Patient           Patient will benefit from skilled therapeutic intervention in order to improve the following deficits and impairments:  Decreased balance,Decreased coordination,Decreased range of motion,Decreased strength,Decreased mobility,Postural dysfunction,Impaired tone,Decreased activity tolerance,Abnormal gait,Decreased endurance,Difficulty walking,Impaired sensation  Visit Diagnosis: Muscle weakness (generalized)  Difficulty in walking, not elsewhere classified  Unsteadiness on feet  Other symptoms and signs involving the nervous system  Abnormal posture     Problem List Patient Active Problem List   Diagnosis Date Noted  . Erectile dysfunction due to diseases classified elsewhere 03/02/2020  . 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 Spasticity 08/03/2019  . Paraplegia ([place]) 07/07/2019  . Neurogenic bowel 07/07/2019  . Neurogenic bladder 07/07/2019  . Thoracic myelopathy 06/30/2019    Willow Ora, PTA, [facility] Neuro [facility] [address], [place], [place] [address] [phone] 04/08/20, 12:49 PM  Name: Timothy Davenport MRN: [id number] Date of Birth: [birth year]

## 2020-04-12 ENCOUNTER — Ambulatory Visit: Payer: No Typology Code available for payment source | Admitting: Physical Therapy

## 2020-04-14 ENCOUNTER — Encounter: Payer: Self-pay | Admitting: Physical Therapy

## 2020-04-14 ENCOUNTER — Ambulatory Visit: Payer: No Typology Code available for payment source | Admitting: Physical Therapy

## 2020-04-14 ENCOUNTER — Other Ambulatory Visit: Payer: Self-pay

## 2020-04-14 DIAGNOSIS — M6281 Muscle weakness (generalized): Secondary | ICD-10-CM | POA: Diagnosis not present

## 2020-04-14 DIAGNOSIS — R262 Difficulty in walking, not elsewhere classified: Secondary | ICD-10-CM

## 2020-04-14 DIAGNOSIS — R29818 Other symptoms and signs involving the nervous system: Secondary | ICD-10-CM

## 2020-04-14 DIAGNOSIS — R293 Abnormal posture: Secondary | ICD-10-CM

## 2020-04-14 DIAGNOSIS — R2681 Unsteadiness on feet: Secondary | ICD-10-CM

## 2020-04-15 NOTE — Therapy (Addendum)
[facility] [address] [place], [name], [address] Phone: [phone]   Fax:  [phone]  Physical Therapy Treatment/Re-Cert   Patient Details  Name: Timothy Davenport MRN: [id number] Date of Birth: 1950/06/14 Referring Provider (PT): Juel Burrow    Encounter Date: 04/14/2020    04/14/20 1109  PT Visits / Re-Eval  Visit Number 14  Number of Visits 30 (1 more visit left of previous visits, plus requesting an additional 15 visits)  Date for PT Re-Evaluation 07/17/20 (60 day POC, 90 day cert)  Authorization  Authorization Type VA - 15 visits 02/05/20 - 06/04/20, awaiting approval from request of 46 additional visits  Authorization - Visit Number 14 (1 more visit left of previous visits, plus requesting an additional 15 visits)  Authorization - Number of Visits 15  Progress Note Due on Visit 20  PT Time Calculation  PT Start Time 1103  PT Stop Time 1145  PT Time Calculation (min) 42 min  PT - End of Session  Equipment Utilized During Treatment Gait belt  Activity Tolerance Patient tolerated treatment well;Patient limited by fatigue  Behavior During Therapy WFL for tasks assessed/performed    Past Medical History:  Diagnosis Date  . Colon polyp   . Diabetes mellitus without complication ([place])   . Diabetic neuropathy ([place])   . HTN (hypertension)   . Patella fracture   . Renal calculi     Past Surgical History:  Procedure Laterality Date  . LUMBAR LAMINECTOMY/DECOMPRESSION MICRODISCECTOMY N/A 07/01/2019   Procedure: LUMBAR LAMINECTOMY/DECOMPRESSION MICRODISCECTOMY 1 LEVEL, THORACIC SIX-SEVEN;  Surgeon: Consuella Lose, MD;  Location: [facility];  Service: Neurosurgery;  Laterality: N/A;  LUMBAR LAMINECTOMY/DECOMPRESSION MICRODISCECTOMY 1 LEVEL, THORACIC SIX-SEVEN   . ORIF PATELLA    . THORACIC DISCECTOMY N/A 07/03/2019   Procedure: Evacuation of Thoracic Hematoma;  Surgeon: Consuella Lose, MD;  Location:  [facility];  Service: Neurosurgery;  Laterality: N/A;    There were no vitals filed for this visit.   Subjective Assessment - 04/14/20 1107    Subjective No new complaints. No falls or pain to report. Went to [place], [place]. They gave him a good report. Goes back in 3 months for the 6 month checkup.    Pertinent History T8 paraplegia, diabetes, HTN    How long can you stand comfortably? probably a couple minutes with RW.    How long can you walk comfortably? approx. 5-10 minutes with his son and RW.    Patient Stated Goals wants to be able to walk without any assistance    Currently in Pain? No/denies                04/18/20 0001  Assessment  Medical Diagnosis T8 paraplegia  Referring Provider (PT) Piva, Enrico,DO   Onset Date/Surgical Date 06/30/19  Hand Dominance Right  Prior Function  Level of Independence Independent       [facility] Adult PT Treatment/Exercise - 04/14/20 1111      Transfers   Transfers Sit to Stand;Stand to Sit;Lateral/Scoot Transfers;Stand Pivot Transfers    Sit to Stand 4: Min assist;3: Mod assist;With upper extremity assist;From bed;From chair/3-in-1    Stand to Sit 4: Min assist;3: Mod assist;With upper extremity assist;To bed;To chair/3-in-1    Stand Pivot Transfers 3: Mod assist;4: Min assist    Stand Pivot Transfer Details (indicate cue type and reason) mod assist from mat>wheelchair with RW, min assist from wheelchair to mat table. pt with limited stepping from mat to chair, more pivoting on  feet. from wheelchair back to mat table pt able to lift feet/take steps      Ambulation/Gait   Ambulation/Gait Yes    Ambulation/Gait Assistance 4: Min guard;4: Min assist;3: Mod assist    Ambulation/Gait Assistance Details steps with pivot transfers. refer to transfer section for details. Pt's arms too tired after standing at RW 2 times for gait this session.    Assistive device Rolling walker    Ambulation Surface Level;Indoor      Therapeutic Activites     Therapeutic Activities Other Therapeutic Activities    Other Therapeutic Activities standing at RW: min guard to min assist. 1st time 2 minutes 47.16 sec's. 2cd time      Knee/Hip Exercises: Seated   Hamstring Curl AROM;Strengthening;Both;1 set;10 reps;Limitations    Hamstring Limitations with foot on pillowcase to allow it to slide on floor with red band resistance on left LE and yellow band resistance on right LE for 2 sets of 10 reps each. cues with occaisonal assist for slow, controlled movements.                    PT Short Term Goals - 03/17/20 1238      PT SHORT TERM GOAL #1   Title Pt will be independent with initial HEP in order to build upon functional gains made in therapy. ALL STGS DUE 03/21/20    Baseline 03/17/20: met with current program per pt report    Status Achieved      PT SHORT TERM GOAL #2   Title Pt will perform sit <> stand with min A from standard height mat table surface in order to demo improved BLE strength and improved transfer ability.    Baseline 03/17/20: met in session today    Time --    Period --    Status Achieved      PT SHORT TERM GOAL #3   Title Pt will perform standing in RW for 3 minutes with supervision in order to demo improved functional weight bearing.    Baseline 03/17/20: met time with min guard assist for safety, no physical assistance needed    Time --    Period --    Status Achieved      PT SHORT TERM GOAL #4   Title Pt will ambulate at least 7' with min A in order to demo improved household mobility.    Baseline 03/17/20: 70 feet with min/mod assist of  2 (increased assist as pt fatigues), improved from eval just not to goal level    Time --    Period --    Status Partially Met          Revised/ongoing STGs:    PT Long Term Goals - 04/14/20 1110      PT LONG TERM GOAL #1   Title Pt and spouse will be independent with final HEP for BLE stretching and strengthening. ALL LTGS DUE 04/18/20    Baseline 04/14/20: met with  current program, will benefit from updates as pt progresses    Status Achieved      PT LONG TERM GOAL #2   Title Pt will tolerate static/dynamic standing activities with min guard in RW for at least 4 minutes in order to demo improved standing tolerance.    Baseline 04/14/20: pt able to stand just over 2 minutes at [place] with min gaurd assist static/dynamic combined    Time --    Period --    Status Not Met  PT LONG TERM GOAL #3   Title Pt will perform stand pivot transfer with RW from w/c <> mat table with min A .    Baseline 04/14/20: performed for 1st time today with min/mod assist needed with RW mat<>wheelchair.    Time --    Period --    Status Not Met      PT LONG TERM GOAL #4   Title Pt will perform sit <> stand with RW with min guard in order to demo improved functional mobility.    Baseline 04/14/20: min assist for lower surfaces to RW, improved as pt needed elevated surfaces prior    Time --    Period --    Status Partially Met      PT LONG TERM GOAL #5   Title Pt will ambulate at least 115' with RW with min guard in order to demo improved functional mobility.    Baseline 04/14/20: pt has met this distance with previous sessions with min guard to mod assist, increased as he fatigues and second person for safety with wheelchair follow    Time --    Period --    Status Partially Met           Revised/ongoing LTGs for re-cert:     PT Long Term Goals - 04/18/20 8676      PT LONG TERM GOAL #1   Title Pt and spouse will be independent with final HEP for BLE stretching and strengthening. ALL LTGS DUE 06/27/20    Baseline 04/14/20: met with current program, will benefit from updates as pt progresses    Time 10   due to delay in scheduling   Period Weeks    Status On-going    Target Date 06/27/20      PT LONG TERM GOAL #2   Title Pt will tolerate static/dynamic standing activities with min guard in RW for at least 4 minutes in order to demo improved standing tolerance.     Baseline 04/14/20: pt able to stand just over 2 minutes at RW with min gaurd assist static/dynamic combined    Time 10    Period Weeks    Status On-going      PT LONG TERM GOAL #3   Title Pt will perform stand pivot transfer with RW from w/c <> mat table with min guard.    Baseline 04/14/20: performed for 1st time today with min/mod assist needed with RW mat<>wheelchair.    Time 10    Period Weeks    Status Revised      PT LONG TERM GOAL #4   Title 5x sit <> stand goal to be written as appropriate in order to demo improved functional BLE strength and balance.    Baseline not yet assessed.    Time 10    Period Weeks    Status New      PT LONG TERM GOAL #5   Title Pt will ambulate at least 51' with RW with single therapist and min guard/min A with no w/c follow in order to demo improved household mobility.    Baseline 115' with min guard-mod A of single therapist and w/c follow    Time 10    Period Weeks    Status New                04/14/20 1109  Plan  Clinical Impression Statement Today's skilled session focused on progress toward LTGs for re-cert to be done by primary PT. From primary  PT: The pt is increased his gait distance prior to needing a rest breaks and now requires less assistance with gait. Able to perform distances with single PT with min/mod A and still needs a w/c follow for safety. The pt also can now stand from lower surfaces with same assistance he initially needed from elevated surfaces. Pt did not meet LTG #3 - performed stand pivot transfers with RW for first time on 04/14/20, with pt needing min/mod A from mat <> w/c. Pt will continue to benefit from skilled PT in order to work on strength, gait, balance, ROM, transfers in order to improve functional mobility and incr independence. Requested an additional 15 visits from the [place] to continue working towards goals. STGs and LTGs revised/updated as appropriate.  Personal Factors and Comorbidities Comorbidity  3+;Past/Current Experience  Comorbidities T8 paraplegia, diabetes, HTN  Examination-Activity Limitations Locomotion Level;Transfers;Stand  Examination-Participation Restrictions Community Activity;Yard Work (gardening)  Pt will benefit from skilled therapeutic intervention in order to improve on the following deficits Decreased balance;Decreased coordination;Decreased range of motion;Decreased strength;Decreased mobility;Postural dysfunction;Impaired tone;Decreased activity tolerance;Abnormal gait;Decreased endurance;Difficulty walking;Impaired sensation  Stability/Clinical Decision Making Evolving/Moderate complexity  Rehab Potential Good  PT Frequency 2x / week (1-2x)  PT Duration 8 weeks (8 weeks)  PT Treatment/Interventions Aquatic Therapy;Manual techniques;Therapeutic exercise;Gait training;Neuromuscular re-education;Orthotic Fit/Training  PT Next Visit Plan seated/supine strengthening. gait training with RW (needs close w/c follow). sit <> stands, pre gait activities. Sci[employer], requested more visits - waiting to hear back from [place] Code: [id number]  Consulted and Agree with Plan of Care Patient   Patient will benefit from skilled therapeutic intervention in order to improve the following deficits and impairments:  Decreased balance,Decreased coordination,Decreased range of motion,Decreased strength,Decreased mobility,Postural dysfunction,Impaired tone,Decreased activity tolerance,Abnormal gait,Decreased endurance,Difficulty walking,Impaired sensation  Visit Diagnosis: Muscle weakness (generalized)  Difficulty in walking, not elsewhere classified  Unsteadiness on feet  Other symptoms and signs involving the nervous system  Abnormal posture     Problem List Patient Active Problem List   Diagnosis Date Noted  . Erectile dysfunction due to diseases classified elsewhere 03/02/2020  . 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 Spasticity 08/03/2019  .  Paraplegia ([place]) 07/07/2019  . Neurogenic bowel 07/07/2019  . Neurogenic bladder 07/07/2019  . 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

## 2020-04-18 NOTE — Addendum Note (Signed)
Addended by: Arliss Journey on: 04/18/2020 09:27 AM   Modules accepted: Orders

## 2020-04-20 ENCOUNTER — Other Ambulatory Visit: Payer: Self-pay

## 2020-04-20 ENCOUNTER — Ambulatory Visit: Payer: No Typology Code available for payment source | Attending: Family Medicine | Admitting: Physical Therapy

## 2020-04-20 ENCOUNTER — Encounter: Payer: Self-pay | Admitting: Physical Therapy

## 2020-04-20 DIAGNOSIS — M6281 Muscle weakness (generalized): Secondary | ICD-10-CM | POA: Diagnosis not present

## 2020-04-20 DIAGNOSIS — R262 Difficulty in walking, not elsewhere classified: Secondary | ICD-10-CM | POA: Insufficient documentation

## 2020-04-20 DIAGNOSIS — R29818 Other symptoms and signs involving the nervous system: Secondary | ICD-10-CM | POA: Insufficient documentation

## 2020-04-20 DIAGNOSIS — R2681 Unsteadiness on feet: Secondary | ICD-10-CM | POA: Diagnosis present

## 2020-04-20 NOTE — Therapy (Signed)
[facility] [address] [place], [name], [address] Phone: [phone]   Fax:  [phone]  Physical Therapy Treatment  Patient Details  Name: Timothy Davenport MRN: [id number] Date of Birth: [birth year] Referring Provider (PT): Juel Burrow    Encounter Date: 04/20/2020   PT End of Session - 04/20/20 1109    Visit Number 15    Number of Visits 30   1 more visit left of previous visits, plus requesting an additional 15 visits   Date for PT Re-Evaluation 07/17/20   60 day POC, 90 day cert   Authorization Type VA - 15 visits 02/05/20 - 06/04/20, awaiting approval from request of 86 additional visits    Authorization - Visit Number 15   1 more visit left of previous visits, plus requesting an additional 15 visits   Authorization - Number of Visits 15    Progress Note Due on Visit 20    PT Start Time 1018    PT Stop Time 1101    PT Time Calculation (min) 43 min    Equipment Utilized During Treatment Gait belt    Activity Tolerance Patient tolerated treatment well;Patient limited by fatigue    Behavior During Therapy WFL for tasks assessed/performed           Past Medical History:  Diagnosis Date  . Colon polyp   . Diabetes mellitus without complication ([place])   . Diabetic neuropathy ([place])   . HTN (hypertension)   . Patella fracture   . Renal calculi     Past Surgical History:  Procedure Laterality Date  . LUMBAR LAMINECTOMY/DECOMPRESSION MICRODISCECTOMY N/A 07/01/2019   Procedure: LUMBAR LAMINECTOMY/DECOMPRESSION MICRODISCECTOMY 1 LEVEL, THORACIC SIX-SEVEN;  Surgeon: Consuella Lose, MD;  Location: [facility];  Service: Neurosurgery;  Laterality: N/A;  LUMBAR LAMINECTOMY/DECOMPRESSION MICRODISCECTOMY 1 LEVEL, THORACIC SIX-SEVEN   . ORIF PATELLA    . THORACIC DISCECTOMY N/A 07/03/2019   Procedure: Evacuation of Thoracic Hematoma;  Surgeon: Consuella Lose, MD;  Location: [facility];  Service: Neurosurgery;  Laterality:  N/A;    There were no vitals filed for this visit.   Subjective Assessment - 04/20/20 1020    Subjective No changes since he was last here.    Pertinent History T8 paraplegia, diabetes, HTN    How long can you stand comfortably? probably a couple minutes with RW.    How long can you walk comfortably? approx. 5-10 minutes with his son and RW.    Patient Stated Goals wants to be able to walk without any assistance    Currently in Pain? No/denies                             [facility] Adult PT Treatment/Exercise - 04/20/20 1031      Transfers   Transfers Sit to Stand;Stand to Sit;Lateral/Scoot Transfers;Stand Pivot Transfers    Sit to Stand 4: Min assist;3: Mod assist;With upper extremity assist;From bed;From chair/3-in-1    Sit to Stand Details Verbal cues for technique;Verbal cues for precautions/safety;Verbal cues for safe use of DME/AE    Sit to Stand Details (indicate cue type and reason) increased height of mat table, needing to hold RW to prevent it from tipping while pt stands, x4 reps total performed throughout session. unable to perform from lower mat table today    Stand to Sit 4: Min assist;3: Mod assist;With upper extremity assist;To bed;To chair/3-in-1    Stand to Sit Details (indicate cue type  and reason) Verbal cues for sequencing;Verbal cues for technique;Verbal cues for safe use of DME/AE    Stand to Sit Details from RW to mat tabl with min A and from RW to w/c with mod A for controlled descent, cues to reach posteriorly    Comments standing at RW: 4 minutes and 22 seconds with min guard, cues for posture and B knee extension in standing      Ambulation/Gait   Ambulation/Gait Yes    Ambulation/Gait Assistance 4: Min assist;3: Mod assist    Ambulation/Gait Assistance Details pt needing PT providing min A and then mod A with PT tech providing min guard and additional w/c follow today. pt needing cues for step placement with RLE as tendency to place too narrow,  pt only having contact with ground with R toe today    Ambulation Distance (Feet) 115 Feet   x1   Assistive device Rolling walker    Gait Pattern Step-to pattern;Decreased weight shift to right;Left flexed knee in stance;Right flexed knee in stance;Narrow base of support;Decreased step length - right;Decreased step length - left;Decreased stance time - right;Decreased hip/knee flexion - right;Step-through pattern    Ambulation Surface Level;Indoor               Access Code: [email or web address] URL: [email or web address] Date: 04/20/2020 Prepared by: Janann August  Reviewed pt's HEP for next couple weeks for pt to perform at home while waiting for [facility] auth:   Exercises Modified Mar[place]o Moores Stretch - 1 x daily - 5 x weekly - 1 sets - 10 reps Seated Single Leg Hip Abduction with Resistance - 1 x daily - 5 x weekly - 1 sets - 10 reps Seated Heel Slide - 1 x daily - 5 x weekly - 1 sets - 10 reps Seated Hamstring Curl with Anchored Resistance - 1 x daily - 5 x weekly - 1 sets - 10 reps Standing March with Counter Support - 1 x daily - 5 x weekly - 1 sets - 10 reps Wide Stance with Counter Support - 1 x daily - 5 x weekly - 1 sets - 10 reps - with slight knee bend and back up to tall posture, cues not to rely on arms  Supine Quad Set - 1 x daily - 5 x weekly - 2 sets - 10 reps    PT Education - 04/20/20 1108    Education Details reviewed and finalized HEP, scheduling out a couple weeks ahead while waiting for [place] for more visits    Person(s) Educated Patient    Methods Explanation;Demonstration;Handout    Comprehension Verbalized understanding;Returned demonstration            PT Short Term Goals - 04/18/20 0915      PT SHORT TERM GOAL #1   Title Pt will perform stand pivot transfer with RW from w/c <> mat table with min A . ALL STGS DUE 05/30/20    Baseline 04/14/20: performed for 1st time today with min/mod assist needed with RW mat<>wheelchair.    Time 6   due to delay in  scheduling   Period Weeks    Status New    Target Date 05/30/20      PT SHORT TERM GOAL #2   Title Pt will perform sit <> stand with min guard from standard height mat table surface in order to demo improved BLE strength and improved transfer ability.    Baseline 04/14/20: min assist for lower surfaces to RW,    Time 6  Period Weeks    Status New      PT SHORT TERM GOAL #3   Title Pt will ambulate at least 115' with RW with min guard/min A from single therapist and w/c follow for safety.    Baseline needs min guard-mod A from single therapist depending on pt's fatigue, needs w/c follow for safety    Time 6    Period Weeks    Status New             PT Long Term Goals - 04/18/20 5465      PT LONG TERM GOAL #1   Title Pt and spouse will be independent with final HEP for BLE stretching and strengthening. ALL LTGS DUE 06/27/20    Baseline 04/14/20: met with current program, will benefit from updates as pt progresses    Time 10   due to delay in scheduling   Period Weeks    Status On-going    Target Date 06/27/20      PT LONG TERM GOAL #2   Title Pt will tolerate static/dynamic standing activities with min guard in RW for at least 4 minutes in order to demo improved standing tolerance.    Baseline 04/14/20: pt able to stand just over 2 minutes at RW with min gaurd assist static/dynamic combined    Time 10    Period Weeks    Status On-going      PT LONG TERM GOAL #3   Title Pt will perform stand pivot transfer with RW from w/c <> mat table with min guard.    Baseline 04/14/20: performed for 1st time today with min/mod assist needed with RW mat<>wheelchair.    Time 10    Period Weeks    Status Revised      PT LONG TERM GOAL #4   Title 5x sit <> stand goal to be written as appropriate in order to demo improved functional BLE strength and balance.    Baseline not yet assessed.    Time 10    Period Weeks    Status New      PT LONG TERM GOAL #5   Title Pt will ambulate at  least 74' with RW with single therapist and min guard/min A with no w/c follow in order to demo improved household mobility.    Baseline 115' with min guard-mod A of single therapist and w/c follow    Time 10    Period Weeks    Status New                 Plan - 04/20/20 1118    Clinical Impression Statement Pt able to stand today at RW with min guard for 4 minutes and 22 seconds and meet prior LTG. Pt needing more assistance during gait today with single therapist performing min/mod A and PT tech providing close CGA. Pt with decr heel contact today with RLE during gait and had a couple instances of placing RLE too narrow. Reviewed pt's HEP as pt currently needing to wait a couple weeks to return to therapy until he has additional visits approved from the [place].    Personal Factors and Comorbidities Comorbidity 3+;Past/Current Experience    Comorbidities T8 paraplegia, diabetes, HTN    Examination-Activity Limitations Locomotion Level;Transfers;Stand    Examination-Participation Restrictions Community Activity;Yard Work   Community education officer Evolving/Moderate complexity    Rehab Potential Good    PT Frequency 2x / week   1-2x   PT Duration  8 weeks   8 weeks   PT Treatment/Interventions Aquatic Therapy;Manual techniques;Therapeutic exercise;Gait training;Neuromuscular re-education;Orthotic Fit/Training    PT Next Visit Plan seated/supine strengthening. gait training with RW (needs close w/c follow). sit <> stands, pre gait activities. SciFit, requested more visits - waiting to hear back from [place] Code: [id number]    Consulted and Agree with Plan of Care Patient           Patient will benefit from skilled therapeutic intervention in order to improve the following deficits and impairments:  Decreased balance,Decreased coordination,Decreased range of motion,Decreased strength,Decreased mobility,Postural dysfunction,Impaired  tone,Decreased activity tolerance,Abnormal gait,Decreased endurance,Difficulty walking,Impaired sensation  Visit Diagnosis: Muscle weakness (generalized)  Difficulty in walking, not elsewhere classified  Unsteadiness on feet  Other symptoms and signs involving the nervous system     Problem List Patient Active Problem List   Diagnosis Date Noted  . Erectile dysfunction due to diseases classified elsewhere 03/02/2020  . 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 Spasticity 08/03/2019  . Paraplegia ([place]) 07/07/2019  . Neurogenic bowel 07/07/2019  . Neurogenic bladder 07/07/2019  . Thoracic myelopathy 06/30/2019    Arliss Journey, PT, DPT  04/20/2020, 11:20 AM  [facility] [address] [place], [name], [address] Phone: [phone]   Fax:  [phone]  Name: RENO CLASBY MRN: [id number] Date of Birth: [birth year]

## 2020-04-20 NOTE — Patient Instructions (Signed)
Access Code: [email or web address] URL: [email or web address] Date: 04/20/2020 Prepared by: Janann August  Program Notes buttock raise: off of hospital bed, having your son/wife there, pressing through arms and legs lifting your bottom off the bed, holding for ~2 seconds and then sitting back down. 2 x 5 reps, 1 -2x per day    Exercises Modified [place] Stretch - 1 x daily - 5 x weekly - 1 sets - 10 reps Seated Single Leg Hip Abduction with Resistance - 1 x daily - 5 x weekly - 1 sets - 10 reps Seated Heel Slide - 1 x daily - 5 x weekly - 1 sets - 10 reps Seated Hamstring Curl with Anchored Resistance - 1 x daily - 5 x weekly - 1 sets - 10 reps Standing March with Counter Support - 1 x daily - 5 x weekly - 1 sets - 10 reps Wide Stance with Counter Support - 1 x daily - 5 x weekly - 1 sets - 10 reps Supine Quad Set - 1 x daily - 5 x weekly - 2 sets - 10 reps

## 2020-05-13 ENCOUNTER — Other Ambulatory Visit: Payer: Self-pay

## 2020-05-13 ENCOUNTER — Ambulatory Visit: Payer: No Typology Code available for payment source | Admitting: Physical Therapy

## 2020-05-13 ENCOUNTER — Telehealth: Payer: Self-pay

## 2020-05-13 ENCOUNTER — Encounter: Payer: Self-pay | Admitting: Physical Therapy

## 2020-05-13 DIAGNOSIS — R29818 Other symptoms and signs involving the nervous system: Secondary | ICD-10-CM | POA: Diagnosis present

## 2020-05-13 DIAGNOSIS — R2681 Unsteadiness on feet: Secondary | ICD-10-CM | POA: Diagnosis present

## 2020-05-13 DIAGNOSIS — R262 Difficulty in walking, not elsewhere classified: Secondary | ICD-10-CM | POA: Diagnosis present

## 2020-05-13 DIAGNOSIS — M6281 Muscle weakness (generalized): Secondary | ICD-10-CM | POA: Diagnosis not present

## 2020-05-13 NOTE — Telephone Encounter (Signed)
Wife of patient called asking if patient is determined permanent disabled. Needing for tax purposes, if so need paperwork stating.

## 2020-05-13 NOTE — Therapy (Signed)
[facility] [address] [place], [name], [address] Phone: [phone]   Fax:  [phone]  Physical Therapy Treatment  Patient Details  Name: Timothy Davenport MRN: [id number] Date of Birth: 07-01-1950 Referring Provider (PT): Juel Burrow    Encounter Date: 05/13/2020   PT End of Session - 05/13/20 1157    Visit Number 16    Number of Visits 30   1 more visit left of previous visits, plus requesting an additional 15 visits   Date for PT Re-Evaluation 07/17/20   60 day POC, 90 day cert   Authorization Type VA - 15 visits 02/05/20 - 06/04/20, 15 additional visits from 3/8-08/24/20    Authorization - Visit Number 1   1 more visit left of previous visits, plus requesting an additional 15 visits   Authorization - Number of Visits 15    Progress Note Due on Visit 20    PT Start Time 1102    PT Stop Time 1143    PT Time Calculation (min) 41 min    Equipment Utilized During Treatment Gait belt    Activity Tolerance Patient tolerated treatment well;Patient limited by fatigue    Behavior During Therapy WFL for tasks assessed/performed           Past Medical History:  Diagnosis Date  . Colon polyp   . Diabetes mellitus without complication ([place])   . Diabetic neuropathy ([place])   . HTN (hypertension)   . Patella fracture   . Renal calculi     Past Surgical History:  Procedure Laterality Date  . LUMBAR LAMINECTOMY/DECOMPRESSION MICRODISCECTOMY N/A 07/01/2019   Procedure: LUMBAR LAMINECTOMY/DECOMPRESSION MICRODISCECTOMY 1 LEVEL, THORACIC SIX-SEVEN;  Surgeon: Consuella Lose, MD;  Location: [facility];  Service: Neurosurgery;  Laterality: N/A;  LUMBAR LAMINECTOMY/DECOMPRESSION MICRODISCECTOMY 1 LEVEL, THORACIC SIX-SEVEN   . ORIF PATELLA    . THORACIC DISCECTOMY N/A 07/03/2019   Procedure: Evacuation of Thoracic Hematoma;  Surgeon: Consuella Lose, MD;  Location: [facility];  Service: Neurosurgery;  Laterality: N/A;    There  were no vitals filed for this visit.   Subjective Assessment - 05/13/20 1104    Subjective No changes since he was last here. Has been doing very little walking at home. But has been doing a lot of standing - has been doing it with the walker. Is having L shoulder/arm pain, will be seeing his doctor next week - is unsure how he hurt it.    Pertinent History T8 paraplegia, diabetes, HTN    How long can you stand comfortably? probably a couple minutes with RW.    How long can you walk comfortably? approx. 5-10 minutes with his son and RW.    Patient Stated Goals wants to be able to walk without any assistance    Currently in Pain? Yes    Pain Score 2     Pain Location Shoulder    Pain Orientation Left    Pain Descriptors / Indicators Sore    Pain Type Acute pain    Aggravating Factors  nothing, more prevalent at night.    Pain Relieving Factors not sure.                             [facility] Adult PT Treatment/Exercise - 05/13/20 1116      Transfers   Transfers Sit to Stand;Stand to Sit;Lateral/Scoot Transfers;Stand Pivot Transfers    Sit to Stand 4: Min assist  Sit to Stand Details Verbal cues for technique;Verbal cues for precautions/safety;Verbal cues for safe use of DME/AE    Sit to Stand Details (indicate cue type and reason) from elevated mat table, pt putting BUE on RW, with therapist helping to steady RW, x4 reps total performed throughout session    Stand to Sit 4: Min assist    Stand to Sit Details (indicate cue type and reason) Verbal cues for sequencing;Verbal cues for technique;Verbal cues for safe use of DME/AE    Stand to Sit Details from RW to mat table, cues for slowed descent, needing mod A when sitting back to w/c from standing with RW, due to foot plate making it farther back to sit down    Comments standing at RW at edge of mat table with min guard: cues for posture, glute and knee activiation. 1 min 30 seconds, 2 minutes - during 2nd bout performing x8  reps alternating marching (therapist holding RW)      Ambulation/Gait   Ambulation/Gait Yes    Ambulation/Gait Assistance 4: Min assist    Ambulation/Gait Assistance Details 1 person assist with min A and then 2nd person providing stand by assist during 1st lap, had w/c follow (but not needed), cues throuhgout for posture and for RLE foot placement (tendency for incr narrow BOS/adduction). performed a 2nd lap of gait beginning with min A and then mod assist when more fatigued and assist for RLE foot clearance.    Ambulation Distance (Feet) 115 Feet   x1, 70 x 1   Assistive device Rolling walker    Gait Pattern Step-to pattern;Decreased weight shift to right;Left flexed knee in stance;Right flexed knee in stance;Narrow base of support;Decreased step length - right;Decreased step length - left;Decreased stance time - right;Decreased hip/knee flexion - right;Step-through pattern    Ambulation Surface Level;Indoor    Gait Comments pt needing mod A with RW when turning to go back to sitting towards mat after 1st bout of gait      Knee/Hip Exercises: Supine   Other Supine Knee/Hip Exercises bent knee fall outs x10 reps B with yellow tband, cues for slowed and controlled movements and tactile cues for ROM    Other Supine Knee/Hip Exercises straight leg RLE knee ABD/adduction x10 reps with assist from therapist for RLE ABD  and for R leg to be put in proper position, pillow case on R foot and sliding board to help assist with movement                    PT Short Term Goals - 04/18/20 0915      PT SHORT TERM GOAL #1   Title Pt will perform stand pivot transfer with RW from w/c <> mat table with min A . ALL STGS DUE 05/30/20    Baseline 04/14/20: performed for 1st time today with min/mod assist needed with RW mat<>wheelchair.    Time 6   due to delay in scheduling   Period Weeks    Status New    Target Date 05/30/20      PT SHORT TERM GOAL #2   Title Pt will perform sit <> stand with min  guard from standard height mat table surface in order to demo improved BLE strength and improved transfer ability.    Baseline 04/14/20: min assist for lower surfaces to RW,    Time 6    Period Weeks    Status New      PT SHORT TERM GOAL #3  Title Pt will ambulate at least 115' with RW with min guard/min A from single therapist and w/c follow for safety.    Baseline needs min guard-mod A from single therapist depending on pt's fatigue, needs w/c follow for safety    Time 6    Period Weeks    Status New             PT Long Term Goals - 04/18/20 6378      PT LONG TERM GOAL #1   Title Pt and spouse will be independent with final HEP for BLE stretching and strengthening. ALL LTGS DUE 06/27/20    Baseline 04/14/20: met with current program, will benefit from updates as pt progresses    Time 10   due to delay in scheduling   Period Weeks    Status On-going    Target Date 06/27/20      PT LONG TERM GOAL #2   Title Pt will tolerate static/dynamic standing activities with min guard in RW for at least 4 minutes in order to demo improved standing tolerance.    Baseline 04/14/20: pt able to stand just over 2 minutes at RW with min gaurd assist static/dynamic combined    Time 10    Period Weeks    Status On-going      PT LONG TERM GOAL #3   Title Pt will perform stand pivot transfer with RW from w/c <> mat table with min guard.    Baseline 04/14/20: performed for 1st time today with min/mod assist needed with RW mat<>wheelchair.    Time 10    Period Weeks    Status Revised      PT LONG TERM GOAL #4   Title 5x sit <> stand goal to be written as appropriate in order to demo improved functional BLE strength and balance.    Baseline not yet assessed.    Time 10    Period Weeks    Status New      PT LONG TERM GOAL #5   Title Pt will ambulate at least 86' with RW with single therapist and min guard/min A with no w/c follow in order to demo improved household mobility.    Baseline 115'  with min guard-mod A of single therapist and w/c follow    Time 10    Period Weeks    Status New                 Plan - 05/13/20 1201    Clinical Impression Statement Pt returns to PT today after getting new auth from [place]. Pt able to perform 2 bouts of gait with RW - 115' and then 61', needing min A at first and then needing mod A when more fatigued for RLE foot clearance. Pt demonstrating more narrowed steps with RLE, needs cues for wider BOS to prevent L foot from getting caught behind R. Will continue to progress towards LTGs.    Personal Factors and Comorbidities Comorbidity 3+;Past/Current Experience    Comorbidities T8 paraplegia, diabetes, HTN    Examination-Activity Limitations Locomotion Level;Transfers;Stand    Examination-Participation Restrictions Community Activity;Yard Work   Community education officer Evolving/Moderate complexity    Rehab Potential Good    PT Frequency 2x / week   1-2x   PT Duration 8 weeks   8 weeks   PT Treatment/Interventions Aquatic Therapy;Manual techniques;Therapeutic exercise;Gait training;Neuromuscular re-education;Orthotic Fit/Training    PT Next Visit Plan seated/supine strengthening. gait training with RW (w/c follow, may  not need another person), sit <> stands, pre gait activities. SciFit, dynamic seated balance (compliant surface)    PT Home Exercise Plan Access Code: [email or web address]    Consulted and Agree with Plan of Care Patient           Patient will benefit from skilled therapeutic intervention in order to improve the following deficits and impairments:  Decreased balance,Decreased coordination,Decreased range of motion,Decreased strength,Decreased mobility,Postural dysfunction,Impaired tone,Decreased activity tolerance,Abnormal gait,Decreased endurance,Difficulty walking,Impaired sensation  Visit Diagnosis: Muscle weakness (generalized)  Difficulty in walking, not elsewhere classified  Unsteadiness on feet  Other  symptoms and signs involving the nervous system     Problem List Patient Active Problem List   Diagnosis Date Noted  . Erectile dysfunction due to diseases classified elsewhere 03/02/2020  . 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 Spasticity 08/03/2019  . Paraplegia ([place]) 07/07/2019  . Neurogenic bowel 07/07/2019  . Neurogenic bladder 07/07/2019  . 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 [place], [name], [address] Phone: [phone]   Fax:  [phone]  Name: DORYAN BAHL MRN: [id number] Date of Birth: 12-Dec-1950

## 2020-05-17 ENCOUNTER — Encounter: Payer: Self-pay | Admitting: Physical Therapy

## 2020-05-17 ENCOUNTER — Ambulatory Visit: Payer: No Typology Code available for payment source | Admitting: Physical Therapy

## 2020-05-17 ENCOUNTER — Other Ambulatory Visit: Payer: Self-pay

## 2020-05-17 DIAGNOSIS — M6281 Muscle weakness (generalized): Secondary | ICD-10-CM | POA: Diagnosis not present

## 2020-05-17 DIAGNOSIS — R262 Difficulty in walking, not elsewhere classified: Secondary | ICD-10-CM

## 2020-05-17 DIAGNOSIS — R29818 Other symptoms and signs involving the nervous system: Secondary | ICD-10-CM

## 2020-05-17 DIAGNOSIS — R2681 Unsteadiness on feet: Secondary | ICD-10-CM

## 2020-05-17 NOTE — Therapy (Signed)
[facility] [address] [place], [name], [address] Phone: [phone]   Fax:  [phone]  Physical Therapy Treatment  Patient Details  Name: Timothy Davenport MRN: [id number] Date of Birth: May 10, 1950 Referring Provider (PT): Juel Burrow    Encounter Date: 05/17/2020   PT End of Session - 05/17/20 1023    Visit Number 17    Number of Visits 30   1 more visit left of previous visits, plus requesting an additional 15 visits   Date for PT Re-Evaluation 07/17/20   60 day POC, 90 day cert   Authorization Type VA - 15 visits 02/05/20 - 06/04/20, 15 additional visits from 3/8-08/24/20    Authorization - Visit Number 2    Authorization - Number of Visits 15    Progress Note Due on Visit 20    PT Start Time 1018    PT Stop Time 1100    PT Time Calculation (min) 42 min    Equipment Utilized During Treatment Gait belt    Activity Tolerance Patient tolerated treatment well;Patient limited by fatigue    Behavior During Therapy WFL for tasks assessed/performed           Past Medical History:  Diagnosis Date  . Colon polyp   . Diabetes mellitus without complication ([place])   . Diabetic neuropathy ([place])   . HTN (hypertension)   . Patella fracture   . Renal calculi     Past Surgical History:  Procedure Laterality Date  . LUMBAR LAMINECTOMY/DECOMPRESSION MICRODISCECTOMY N/A 07/01/2019   Procedure: LUMBAR LAMINECTOMY/DECOMPRESSION MICRODISCECTOMY 1 LEVEL, THORACIC SIX-SEVEN;  Surgeon: Consuella Lose, MD;  Location: [facility];  Service: Neurosurgery;  Laterality: N/A;  LUMBAR LAMINECTOMY/DECOMPRESSION MICRODISCECTOMY 1 LEVEL, THORACIC SIX-SEVEN   . ORIF PATELLA    . THORACIC DISCECTOMY N/A 07/03/2019   Procedure: Evacuation of Thoracic Hematoma;  Surgeon: Consuella Lose, MD;  Location: [facility];  Service: Neurosurgery;  Laterality: N/A;    There were no vitals filed for this visit.   Subjective Assessment - 05/17/20 1023     Subjective No new compaints. No falls or pain to report (left arm feels better now). Still mostly standing at home with a little gait.    Pertinent History T8 paraplegia, diabetes, HTN    How long can you stand comfortably? probably a couple minutes with RW.    How long can you walk comfortably? approx. 5-10 minutes with his son and RW.    Patient Stated Goals wants to be able to walk without any assistance    Currently in Pain? No/denies    Pain Score 0-No pain               [facility] Adult PT Treatment/Exercise - 05/17/20 1024      Transfers   Transfers Sit to Stand;Stand to Sit;Lateral/Scoot Transfers;Stand Pivot Transfers    Sit to Stand 4: Min assist;With upper extremity assist;From bed    Sit to Stand Details Verbal cues for technique;Verbal cues for precautions/safety;Verbal cues for safe use of DME/AE    Sit to Stand Details (indicate cue type and reason) from elevated mat<>RW x2 reps with therapist stabilizing RW as pt places both hands on RW.    Stand to Sit 4: Min assist;With upper extremity assist;To chair/3-in-1;3: Mod assist    Stand to Sit Details (indicate cue type and reason) Verbal cues for sequencing;Verbal cues for technique;Verbal cues for safe use of DME/AE    Stand to Sit Details RW>mat x1, RW>wheelchair x1 with  assist for controlled descent each time, pt keeping hands on RW. increased assist for safety with sitting to wheelchair due to placement of footplace preventing pt from getting closer to seat of wheelchair.    Lateral/Scoot Transfers 6: Modified independent (Device/Increase time)    Lateral/Scoot Transfer Details (indicate cue type and reason) wheelchair>mat table      Ambulation/Gait   Ambulation/Gait Yes    Ambulation/Gait Assistance 4: Min guard;4: Min assist    Ambulation/Gait Assistance Details initially min guard assist for gait, progressing to min assist as pt fatigued with second person stand by for safety. cues for hip/knee extension in stance,  step placement for increased base of support/decreased scissoring. pt noted to catch right foot several times with swing phase, may benefit from trial of simulated toe cap at next session.    Ambulation Distance (Feet) 115 Feet   x1`   Assistive device Rolling walker    Gait Pattern Step-to pattern;Decreased weight shift to right;Left flexed knee in stance;Right flexed knee in stance;Narrow base of support;Decreased step length - right;Decreased step length - left;Decreased stance time - right;Decreased hip/knee flexion - right;Step-through pattern    Ambulation Surface Level;Indoor      Knee/Hip Exercises: Supine   Short Arc Quad Sets AROM;Strengthening;Both;2 sets;10 reps;Limitations    Short Arc Quad Sets Limitations 2# ankle weight on left, 1# ankle weight on right.    Straight Leg Raises AROM;AAROM;Strengthening;Both;2 sets;10 reps;Limitations    Straight Leg Raises Limitations assist needed to decrease extension lag right>left LE    Other Supine Knee/Hip Exercises bent knee fall outs 2 sets x10 reps bil with red tband, cues for slowed and controlled movements and tactile cues for ROM    Other Supine Knee/Hip Exercises hip abdct/addct with foot on pillow case on slide board, assit to keep LE in neurtral for 2 sets of 10 reps.               PT Short Term Goals - 04/18/20 0915      PT SHORT TERM GOAL #1   Title Pt will perform stand pivot transfer with RW from w/c <> mat table with min A . ALL STGS DUE 05/30/20    Baseline 04/14/20: performed for 1st time today with min/mod assist needed with RW mat<>wheelchair.    Time 6   due to delay in scheduling   Period Weeks    Status New    Target Date 05/30/20      PT SHORT TERM GOAL #2   Title Pt will perform sit <> stand with min guard from standard height mat table surface in order to demo improved BLE strength and improved transfer ability.    Baseline 04/14/20: min assist for lower surfaces to RW,    Time 6    Period Weeks    Status  New      PT SHORT TERM GOAL #3   Title Pt will ambulate at least 115' with RW with min guard/min A from single therapist and w/c follow for safety.    Baseline needs min guard-mod A from single therapist depending on pt's fatigue, needs w/c follow for safety    Time 6    Period Weeks    Status New             PT Long Term Goals - 04/18/20 2836      PT LONG TERM GOAL #1   Title Pt and spouse will be independent with final HEP for BLE stretching and strengthening. ALL  LTGS DUE 06/27/20    Baseline 04/14/20: met with current program, will benefit from updates as pt progresses    Time 10   due to delay in scheduling   Period Weeks    Status On-going    Target Date 06/27/20      PT LONG TERM GOAL #2   Title Pt will tolerate static/dynamic standing activities with min guard in RW for at least 4 minutes in order to demo improved standing tolerance.    Baseline 04/14/20: pt able to stand just over 2 minutes at RW with min gaurd assist static/dynamic combined    Time 10    Period Weeks    Status On-going      PT LONG TERM GOAL #3   Title Pt will perform stand pivot transfer with RW from w/c <> mat table with min guard.    Baseline 04/14/20: performed for 1st time today with min/mod assist needed with RW mat<>wheelchair.    Time 10    Period Weeks    Status Revised      PT LONG TERM GOAL #4   Title 5x sit <> stand goal to be written as appropriate in order to demo improved functional BLE strength and balance.    Baseline not yet assessed.    Time 10    Period Weeks    Status New      PT LONG TERM GOAL #5   Title Pt will ambulate at least 27' with RW with single therapist and min guard/min A with no w/c follow in order to demo improved household mobility.    Baseline 115' with min guard-mod A of single therapist and w/c follow    Time 10    Period Weeks    Status New                 Plan - 05/17/20 1024    Clinical Impression Statement Today's skilled session continued  to focus on LE strengthening and gait with RW. Continued to need only one person hands on assist with second person stand by for safety. No issues noted or reported in session. The pt is progressing toward unmet goals and should benefit from continued PT to progress toward unmet goals.    Personal Factors and Comorbidities Comorbidity 3+;Past/Current Experience    Comorbidities T8 paraplegia, diabetes, HTN    Examination-Activity Limitations Locomotion Level;Transfers;Stand    Examination-Participation Restrictions Community Activity;Yard Work   Community education officer Evolving/Moderate complexity    Rehab Potential Good    PT Frequency 2x / week   1-2x   PT Duration 8 weeks   8 weeks   PT Treatment/Interventions Aquatic Therapy;Manual techniques;Therapeutic exercise;Gait training;Neuromuscular re-education;Orthotic Fit/Training    PT Next Visit Plan seated/supine strengthening. gait training with RW (w/c follow, may not need another person), sit <> stands, pre gait activities. SciFit, dynamic seated balance (compliant surface)    PT Home Exercise Plan Access Code: [email or web address]    Consulted and Agree with Plan of Care Patient           Patient will benefit from skilled therapeutic intervention in order to improve the following deficits and impairments:  Decreased balance,Decreased coordination,Decreased range of motion,Decreased strength,Decreased mobility,Postural dysfunction,Impaired tone,Decreased activity tolerance,Abnormal gait,Decreased endurance,Difficulty walking,Impaired sensation  Visit Diagnosis: Muscle weakness (generalized)  Difficulty in walking, not elsewhere classified  Unsteadiness on feet  Other symptoms and signs involving the nervous system     Problem List Patient Active Problem List   Diagnosis  Date Noted  . Erectile dysfunction due to diseases classified elsewhere 03/02/2020  . 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 Spasticity 08/03/2019  .  Paraplegia ([place]) 07/07/2019  . Neurogenic bowel 07/07/2019  . Neurogenic bladder 07/07/2019  . Thoracic myelopathy 06/30/2019    Willow Ora, PTA, [facility] [address], [place], [place] [address] [phone] 05/17/20, 1:31 PM   Name: UDELL MAZZOCCO MRN: [id number] Date of Birth: [birth year]

## 2020-05-19 ENCOUNTER — Other Ambulatory Visit: Payer: Self-pay

## 2020-05-19 ENCOUNTER — Ambulatory Visit: Payer: No Typology Code available for payment source | Admitting: Physical Therapy

## 2020-05-19 ENCOUNTER — Encounter: Payer: Self-pay | Admitting: Physical Therapy

## 2020-05-19 DIAGNOSIS — R262 Difficulty in walking, not elsewhere classified: Secondary | ICD-10-CM

## 2020-05-19 DIAGNOSIS — M6281 Muscle weakness (generalized): Secondary | ICD-10-CM

## 2020-05-19 DIAGNOSIS — R2681 Unsteadiness on feet: Secondary | ICD-10-CM

## 2020-05-19 DIAGNOSIS — R29818 Other symptoms and signs involving the nervous system: Secondary | ICD-10-CM

## 2020-05-19 NOTE — Therapy (Signed)
[facility] [address] [place], [name], [address] Phone: [phone]   Fax:  [phone]  Physical Therapy Treatment  Patient Details  Name: Timothy Davenport MRN: [id number] Date of Birth: 1950-03-31 Referring Provider (PT): Juel Burrow    Encounter Date: 05/19/2020   PT End of Session - 05/19/20 1234    Visit Number 18    Number of Visits 30   1 more visit left of previous visits, plus requesting an additional 15 visits   Date for PT Re-Evaluation 07/17/20   60 day POC, 90 day cert   Authorization Type VA - 15 visits 02/05/20 - 06/04/20, 15 additional visits from 3/8-08/24/20    Authorization - Visit Number 3    Authorization - Number of Visits 15    Progress Note Due on Visit 20    PT Start Time 1232    PT Stop Time 1315    PT Time Calculation (min) 43 min    Equipment Utilized During Treatment Gait belt    Activity Tolerance Patient tolerated treatment well;Patient limited by fatigue    Behavior During Therapy WFL for tasks assessed/performed           Past Medical History:  Diagnosis Date  . Colon polyp   . Diabetes mellitus without complication ([place])   . Diabetic neuropathy ([place])   . HTN (hypertension)   . Patella fracture   . Renal calculi     Past Surgical History:  Procedure Laterality Date  . LUMBAR LAMINECTOMY/DECOMPRESSION MICRODISCECTOMY N/A 07/01/2019   Procedure: LUMBAR LAMINECTOMY/DECOMPRESSION MICRODISCECTOMY 1 LEVEL, THORACIC SIX-SEVEN;  Surgeon: Consuella Lose, MD;  Location: [facility];  Service: Neurosurgery;  Laterality: N/A;  LUMBAR LAMINECTOMY/DECOMPRESSION MICRODISCECTOMY 1 LEVEL, THORACIC SIX-SEVEN   . ORIF PATELLA    . THORACIC DISCECTOMY N/A 07/03/2019   Procedure: Evacuation of Thoracic Hematoma;  Surgeon: Consuella Lose, MD;  Location: [facility];  Service: Neurosurgery;  Laterality: N/A;    There were no vitals filed for this visit.   Subjective Assessment - 05/19/20 1234     Subjective No changes since last session. No pain or falls to report. Saw the MD who did his surgery today, MD was pleased with pt's progress so far. Does not have a follow up set with this MD.    Pertinent History T8 paraplegia, diabetes, HTN    How long can you stand comfortably? probably a couple minutes with RW.    How long can you walk comfortably? approx. 5-10 minutes with his son and RW.    Patient Stated Goals wants to be able to walk without any assistance    Currently in Pain? No/denies    Pain Score 0-No pain                 [facility] Adult PT Treatment/Exercise - 05/19/20 1237      Transfers   Transfers Sit to Stand;Stand to Sit;Lateral/Scoot Transfers;Stand Pivot Transfers    Sit to Stand 4: Min guard;From elevated surface;With upper extremity assist;From bed    Sit to Stand Details Verbal cues for technique;Verbal cues for precautions/safety;Verbal cues for safe use of DME/AE    Sit to Stand Details (indicate cue type and reason) assist to stabilize RW only as pt places both hands on it to stand. Pt able to stand without physical assistance from elevated mat x 2 reps.    Stand to Sit 4: Min guard;3: Mod assist;With upper extremity assist;To bed;To chair/3-in-1    Stand to Sit Details (  indicate cue type and reason) Verbal cues for sequencing;Verbal cues for technique;Verbal cues for safe use of DME/AE    Stand to Sit Details min guard assist to sit from RW to mat table with controlled descent- pt reaching back with left UE. mod assist to sit safely to wheelchair wiht left UE reaching back due to position of leg rest preventing pt from getting closer to seat of chair.    Lateral/Scoot Transfers 6: Modified independent (Device/Increase time)    Lateral/Scoot Transfer Details (indicate cue type and reason) wheelchair>mat table      Ambulation/Gait   Ambulation/Gait Yes    Ambulation/Gait Assistance 4: Min guard;4: Min assist    Ambulation/Gait Assistance Details with 1st lap-  min guard to min assist and with 2cd lap min guard to mod assist (for last ~20 feet). cues with both laps for posure, hip/knee extension, right step placement and weight shifting. assist for right step placement needed at times with gait.  on second lap increased assistance needed toward end due to bil knees buckling/increased knee flexion in stance at times with pt assist to correct.    Ambulation Distance (Feet) 120 Feet   x1, 114 x1   Assistive device Rolling walker    Gait Pattern Step-to pattern;Decreased weight shift to right;Left flexed knee in stance;Right flexed knee in stance;Narrow base of support;Decreased step length - right;Decreased step length - left;Decreased stance time - right;Decreased hip/knee flexion - right;Step-through pattern    Ambulation Surface Level;Indoor      Knee/Hip Exercises: Supine   Short Arc Quad Sets AROM;Strengthening;Both;2 sets;10 reps;Limitations    Short Arc Quad Sets Limitations 2# ankle weight on left, 1# weight on right LE. 5 sec holds each rep. cues for slow, controlled movements.    Straight Leg Raises AROM;AAROM;Strengthening;Both;2 sets;10 reps;Limitations    Straight Leg Raises Limitations assist needed to decrease extension lag right>left LE    Other Supine Knee/Hip Exercises bent knee fall outs 2 sets x10 reps bil with red tband, cues for slowed and controlled movements and tactile cues for ROM                    PT Short Term Goals - 04/18/20 0915      PT SHORT TERM GOAL #1   Title Pt will perform stand pivot transfer with RW from w/c <> mat table with min A . ALL STGS DUE 05/30/20    Baseline 04/14/20: performed for 1st time today with min/mod assist needed with RW mat<>wheelchair.    Time 6   due to delay in scheduling   Period Weeks    Status New    Target Date 05/30/20      PT SHORT TERM GOAL #2   Title Pt will perform sit <> stand with min guard from standard height mat table surface in order to demo improved BLE strength  and improved transfer ability.    Baseline 04/14/20: min assist for lower surfaces to RW,    Time 6    Period Weeks    Status New      PT SHORT TERM GOAL #3   Title Pt will ambulate at least 115' with RW with min guard/min A from single therapist and w/c follow for safety.    Baseline needs min guard-mod A from single therapist depending on pt's fatigue, needs w/c follow for safety    Time 6    Period Weeks    Status New  PT Long Term Goals - 04/18/20 2671      PT LONG TERM GOAL #1   Title Pt and spouse will be independent with final HEP for BLE stretching and strengthening. ALL LTGS DUE 06/27/20    Baseline 04/14/20: met with current program, will benefit from updates as pt progresses    Time 10   due to delay in scheduling   Period Weeks    Status On-going    Target Date 06/27/20      PT LONG TERM GOAL #2   Title Pt will tolerate static/dynamic standing activities with min guard in RW for at least 4 minutes in order to demo improved standing tolerance.    Baseline 04/14/20: pt able to stand just over 2 minutes at RW with min gaurd assist static/dynamic combined    Time 10    Period Weeks    Status On-going      PT LONG TERM GOAL #3   Title Pt will perform stand pivot transfer with RW from w/c <> mat table with min guard.    Baseline 04/14/20: performed for 1st time today with min/mod assist needed with RW mat<>wheelchair.    Time 10    Period Weeks    Status Revised      PT LONG TERM GOAL #4   Title 5x sit <> stand goal to be written as appropriate in order to demo improved functional BLE strength and balance.    Baseline not yet assessed.    Time 10    Period Weeks    Status New      PT LONG TERM GOAL #5   Title Pt will ambulate at least 48' with RW with single therapist and min guard/min A with no w/c follow in order to demo improved household mobility.    Baseline 115' with min guard-mod A of single therapist and w/c follow    Time 10    Period Weeks     Status New                 Plan - 05/19/20 1234    Clinical Impression Statement Today's skilled session continued to focus on strengthening and gait with RW. Pt able to increase his overall gait distance this session. The pt is progressing toward goals and should benefit from continued PT to progress toward unmet goals.    Personal Factors and Comorbidities Comorbidity 3+;Past/Current Experience    Comorbidities T8 paraplegia, diabetes, HTN    Examination-Activity Limitations Locomotion Level;Transfers;Stand    Examination-Participation Restrictions Community Activity;Yard Work   Community education officer Evolving/Moderate complexity    Rehab Potential Good    PT Frequency 2x / week   1-2x   PT Duration 8 weeks   8 weeks   PT Treatment/Interventions Aquatic Therapy;Manual techniques;Therapeutic exercise;Gait training;Neuromuscular re-education;Orthotic Fit/Training    PT Next Visit Plan seated/supine strengthening. gait training with RW (w/c follow, may not need another person), sit <> stands, pre gait activities. SciFit, dynamic seated balance (compliant surface)    PT Home Exercise Plan Access Code: [email or web address]    Consulted and Agree with Plan of Care Patient           Patient will benefit from skilled therapeutic intervention in order to improve the following deficits and impairments:  Decreased balance,Decreased coordination,Decreased range of motion,Decreased strength,Decreased mobility,Postural dysfunction,Impaired tone,Decreased activity tolerance,Abnormal gait,Decreased endurance,Difficulty walking,Impaired sensation  Visit Diagnosis: Muscle weakness (generalized)  Unsteadiness on feet  Difficulty in walking, not elsewhere classified  Other  symptoms and signs involving the nervous system     Problem List Patient Active Problem List   Diagnosis Date Noted  . Erectile dysfunction due to diseases classified elsewhere 03/02/2020  . Wheelchair  dependence 11/27/2019  . Spasticity 08/03/2019  . Paraplegia ([place]) 07/07/2019  . Neurogenic bowel 07/07/2019  . Neurogenic bladder 07/07/2019  . Thoracic myelopathy 06/30/2019    Willow Ora, PTA, [facility] Outpatient Neuro [facility] [address] [place], [place] [address] [phone] 05/19/20, 1:32 PM   Name: Timothy Davenport MRN: [id number] Date of Birth: 1950/08/23

## 2020-05-24 ENCOUNTER — Encounter: Payer: Self-pay | Admitting: Physical Therapy

## 2020-05-24 ENCOUNTER — Ambulatory Visit: Payer: No Typology Code available for payment source | Attending: Family Medicine | Admitting: Physical Therapy

## 2020-05-24 ENCOUNTER — Other Ambulatory Visit: Payer: Self-pay

## 2020-05-24 DIAGNOSIS — R293 Abnormal posture: Secondary | ICD-10-CM | POA: Insufficient documentation

## 2020-05-24 DIAGNOSIS — R29818 Other symptoms and signs involving the nervous system: Secondary | ICD-10-CM | POA: Insufficient documentation

## 2020-05-24 DIAGNOSIS — R2681 Unsteadiness on feet: Secondary | ICD-10-CM | POA: Insufficient documentation

## 2020-05-24 DIAGNOSIS — R262 Difficulty in walking, not elsewhere classified: Secondary | ICD-10-CM | POA: Insufficient documentation

## 2020-05-24 DIAGNOSIS — M6281 Muscle weakness (generalized): Secondary | ICD-10-CM | POA: Diagnosis not present

## 2020-05-25 NOTE — Therapy (Signed)
[facility] [address] [place], [name], [address] Phone: [phone]   Fax:  [phone]  Physical Therapy Treatment  Patient Details  Name: Timothy Davenport MRN: [id number] Date of Birth: [birth year] Referring Provider (PT): Juel Burrow    Encounter Date: 05/24/2020   PT End of Session - 05/24/20 1235    Visit Number 19    Number of Visits 30   1 more visit left of previous visits, plus requesting an additional 15 visits   Date for PT Re-Evaluation 07/17/20   60 day POC, 90 day cert   Authorization Type VA - 15 visits 02/05/20 - 06/04/20, 15 additional visits from 3/8-08/24/20    Authorization - Visit Number 4    Authorization - Number of Visits 15    Progress Note Due on Visit 20    PT Start Time 1232    PT Stop Time 1315    PT Time Calculation (min) 43 min    Equipment Utilized During Treatment Gait belt    Activity Tolerance Patient tolerated treatment well;Patient limited by fatigue    Behavior During Therapy WFL for tasks assessed/performed           Past Medical History:  Diagnosis Date  . Colon polyp   . Diabetes mellitus without complication ([place])   . Diabetic neuropathy ([place])   . HTN (hypertension)   . Patella fracture   . Renal calculi     Past Surgical History:  Procedure Laterality Date  . LUMBAR LAMINECTOMY/DECOMPRESSION MICRODISCECTOMY N/A 07/01/2019   Procedure: LUMBAR LAMINECTOMY/DECOMPRESSION MICRODISCECTOMY 1 LEVEL, THORACIC SIX-SEVEN;  Surgeon: Consuella Lose, MD;  Location: [facility];  Service: Neurosurgery;  Laterality: N/A;  LUMBAR LAMINECTOMY/DECOMPRESSION MICRODISCECTOMY 1 LEVEL, THORACIC SIX-SEVEN   . ORIF PATELLA    . THORACIC DISCECTOMY N/A 07/03/2019   Procedure: Evacuation of Thoracic Hematoma;  Surgeon: Consuella Lose, MD;  Location: [facility];  Service: Neurosurgery;  Laterality: N/A;    There were no vitals filed for this visit.   Subjective Assessment - 05/24/20 1235     Subjective No new complaints. No falls or pain to report. Felt okay after last session.    Pertinent History T8 paraplegia, diabetes, HTN    How long can you stand comfortably? probably a couple minutes with RW.    How long can you walk comfortably? approx. 5-10 minutes with his son and RW.    Patient Stated Goals wants to be able to walk without any assistance    Currently in Pain? No/denies    Pain Score 0-No pain                  [facility] Adult PT Treatment/Exercise - 05/24/20 1236      Transfers   Transfers Sit to Stand;Stand to Sit;Lateral/Scoot Transfers;Stand Pivot Transfers    Sit to Stand 4: Min guard;From elevated surface;With upper extremity assist;From bed;3: Mod assist    Sit to Stand Details Verbal cues for technique;Verbal cues for precautions/safety;Verbal cues for safe use of DME/AE    Sit to Stand Details (indicate cue type and reason) assist to stablilize RW as pt places bil UE's on RW to stand. increased assistance needed with standing wheelchair>RW.    Stand to Sit 4: Min guard;3: Mod assist;With upper extremity assist;To bed;To chair/3-in-1    Stand to Sit Details (indicate cue type and reason) Verbal cues for sequencing;Verbal cues for technique;Verbal cues for safe use of DME/AE    Stand to Sit Details increased assist needed  for controlled/safe sitting to wheelchair due to placement of foot rest prevents pt from getting close to seat of wheelchair.    Lateral/Scoot Transfers 6: Modified independent (Device/Increase time)    Lateral/Scoot Transfer Details (indicate cue type and reason) wheelchair>mat table      Ambulation/Gait   Ambulation/Gait Yes    Ambulation/Gait Assistance 4: Min guard;4: Min assist;3: Mod assist    Ambulation/Gait Assistance Details increased assistance needed as bil knees buckling due to fatigue from ex's performed prior to gait. Pt started at min guard with up to mod assist needed with fatigue. cues with all gait for posture, increased  hip/knee extension and right step placement to prevent/decrease scissoring.    Ambulation Distance (Feet) 35 Feet   x1, 80 x1   Assistive device Rolling walker    Gait Pattern Step-to pattern;Decreased weight shift to right;Left flexed knee in stance;Right flexed knee in stance;Narrow base of support;Decreased step length - right;Decreased step length - left;Decreased stance time - right;Decreased hip/knee flexion - right;Step-through pattern    Ambulation Surface Level;Indoor      Neuro Re-ed    Neuro Re-ed Details  for strenthening/NMR: seated at edge of mat on green air disc- with 3# weighted ball for OH raises, then chest press for 2 sets of 10 reps. then with green band- rows, shoulder horizontal abduction for 2 sets of 10 reps. then with LE's- toe raises, left heel raises for 10 reps each. alternating marching, then alteranting long arc quads for 2 sets of 10 reps with light UE support on mat for all LE ex's. cues for slow and controlled movements.      Knee/Hip Exercises: Aerobic   Stepper Scifit with BLE only at level 3.0 for 6 minutes, needing mod A of therapist to help prevent w/c from tipping with wedges under wheels and anti -tippers down as well.                    PT Short Term Goals - 04/18/20 0915      PT SHORT TERM GOAL #1   Title Pt will perform stand pivot transfer with RW from w/c <> mat table with min A . ALL STGS DUE 05/30/20    Baseline 04/14/20: performed for 1st time today with min/mod assist needed with RW mat<>wheelchair.    Time 6   due to delay in scheduling   Period Weeks    Status New    Target Date 05/30/20      PT SHORT TERM GOAL #2   Title Pt will perform sit <> stand with min guard from standard height mat table surface in order to demo improved BLE strength and improved transfer ability.    Baseline 04/14/20: min assist for lower surfaces to RW,    Time 6    Period Weeks    Status New      PT SHORT TERM GOAL #3   Title Pt will ambulate at  least 115' with RW with min guard/min A from single therapist and w/c follow for safety.    Baseline needs min guard-mod A from single therapist depending on pt's fatigue, needs w/c follow for safety    Time 6    Period Weeks    Status New             PT Long Term Goals - 04/18/20 6213      PT LONG TERM GOAL #1   Title Pt and spouse will be independent with final HEP for  BLE stretching and strengthening. ALL LTGS DUE 06/27/20    Baseline 04/14/20: met with current program, will benefit from updates as pt progresses    Time 10   due to delay in scheduling   Period Weeks    Status On-going    Target Date 06/27/20      PT LONG TERM GOAL #2   Title Pt will tolerate static/dynamic standing activities with min guard in RW for at least 4 minutes in order to demo improved standing tolerance.    Baseline 04/14/20: pt able to stand just over 2 minutes at RW with min gaurd assist static/dynamic combined    Time 10    Period Weeks    Status On-going      PT LONG TERM GOAL #3   Title Pt will perform stand pivot transfer with RW from w/c <> mat table with min guard.    Baseline 04/14/20: performed for 1st time today with min/mod assist needed with RW mat<>wheelchair.    Time 10    Period Weeks    Status Revised      PT LONG TERM GOAL #4   Title 5x sit <> stand goal to be written as appropriate in order to demo improved functional BLE strength and balance.    Baseline not yet assessed.    Time 10    Period Weeks    Status New      PT LONG TERM GOAL #5   Title Pt will ambulate at least 68' with RW with single therapist and min guard/min A with no w/c follow in order to demo improved household mobility.    Baseline 115' with min guard-mod A of single therapist and w/c follow    Time 10    Period Weeks    Status New                 Plan - 05/24/20 1236    Clinical Impression Statement Today's skilled session continued to focus on strengthening, transfers and gait. Increased  assistance needed with gait today after ex's due to buckling/pt fatigue. No other issues noted or reported in session. The pt is progressing toward goals and should benefit from continued PT to progress toward unmet goals.    Personal Factors and Comorbidities Comorbidity 3+;Past/Current Experience    Comorbidities T8 paraplegia, diabetes, HTN    Examination-Activity Limitations Locomotion Level;Transfers;Stand    Examination-Participation Restrictions Community Activity;Yard Work   Community education officer Evolving/Moderate complexity    Rehab Potential Good    PT Frequency 2x / week   1-2x   PT Duration 8 weeks   8 weeks   PT Treatment/Interventions Aquatic Therapy;Manual techniques;Therapeutic exercise;Gait training;Neuromuscular re-education;Orthotic Fit/Training    PT Next Visit Plan seated/supine strengthening. gait training with RW (w/c follow, may not need another person), sit <> stands, pre gait activities. SciFit, dynamic seated balance (compliant surface)    PT Home Exercise Plan Access Code: [email or web address]    Consulted and Agree with Plan of Care Patient           Patient will benefit from skilled therapeutic intervention in order to improve the following deficits and impairments:  Decreased balance,Decreased coordination,Decreased range of motion,Decreased strength,Decreased mobility,Postural dysfunction,Impaired tone,Decreased activity tolerance,Abnormal gait,Decreased endurance,Difficulty walking,Impaired sensation  Visit Diagnosis: Muscle weakness (generalized)  Unsteadiness on feet  Difficulty in walking, not elsewhere classified  Other symptoms and signs involving the nervous system     Problem List Patient Active Problem List   Diagnosis Date  Noted  . Erectile dysfunction due to diseases classified elsewhere 03/02/2020  . 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 Spasticity 08/03/2019  . Paraplegia ([place]) 07/07/2019  . Neurogenic bowel 07/07/2019  .  Neurogenic bladder 07/07/2019  . Thoracic myelopathy 06/30/2019    Willow Ora, PTA, [facility] Outpatient Neuro [facility] [address], [place], [place] [address] [phone] 05/25/20, 4:54 PM   Name: Timothy Davenport MRN: [id number] Date of Birth: 23-Nov-1950

## 2020-05-26 ENCOUNTER — Encounter: Payer: Self-pay | Admitting: Physical Therapy

## 2020-05-26 ENCOUNTER — Other Ambulatory Visit: Payer: Self-pay

## 2020-05-26 ENCOUNTER — Ambulatory Visit: Payer: No Typology Code available for payment source | Admitting: Physical Therapy

## 2020-05-26 DIAGNOSIS — R293 Abnormal posture: Secondary | ICD-10-CM

## 2020-05-26 DIAGNOSIS — R262 Difficulty in walking, not elsewhere classified: Secondary | ICD-10-CM

## 2020-05-26 DIAGNOSIS — R2681 Unsteadiness on feet: Secondary | ICD-10-CM

## 2020-05-26 DIAGNOSIS — R29818 Other symptoms and signs involving the nervous system: Secondary | ICD-10-CM

## 2020-05-26 DIAGNOSIS — M6281 Muscle weakness (generalized): Secondary | ICD-10-CM

## 2020-05-27 NOTE — Therapy (Addendum)
[facility] [address] [place], [name], [address] Phone: [phone]   Fax:  [phone]  Physical Therapy Treatment/10th Visit Progress Note  Patient Details  Name: Timothy Davenport MRN: [id number] Date of Birth: Jun 28, 1950 Referring Provider (PT): Juel Burrow    Encounter Date: 05/26/2020   PT End of Session - 05/26/20 1235    Visit Number 20    Number of Visits 30   1 more visit left of previous visits, plus requesting an additional 15 visits   Date for PT Re-Evaluation 07/17/20   60 day POC, 90 day cert   Authorization Type VA - 15 visits 02/05/20 - 06/04/20, 15 additional visits from 3/8-08/24/20    Authorization - Visit Number 5    Authorization - Number of Visits 15    Progress Note Due on Visit 20    PT Start Time 1232    PT Stop Time 1315    PT Time Calculation (min) 43 min    Equipment Utilized During Treatment Gait belt    Activity Tolerance Patient tolerated treatment well;Patient limited by fatigue    Behavior During Therapy WFL for tasks assessed/performed           Past Medical History:  Diagnosis Date  . Colon polyp   . Diabetes mellitus without complication ([place])   . Diabetic neuropathy ([place])   . HTN (hypertension)   . Patella fracture   . Renal calculi     Past Surgical History:  Procedure Laterality Date  . LUMBAR LAMINECTOMY/DECOMPRESSION MICRODISCECTOMY N/A 07/01/2019   Procedure: LUMBAR LAMINECTOMY/DECOMPRESSION MICRODISCECTOMY 1 LEVEL, THORACIC SIX-SEVEN;  Surgeon: Consuella Lose, MD;  Location: [facility];  Service: Neurosurgery;  Laterality: N/A;  LUMBAR LAMINECTOMY/DECOMPRESSION MICRODISCECTOMY 1 LEVEL, THORACIC SIX-SEVEN   . ORIF PATELLA    . THORACIC DISCECTOMY N/A 07/03/2019   Procedure: Evacuation of Thoracic Hematoma;  Surgeon: Consuella Lose, MD;  Location: [facility];  Service: Neurosurgery;  Laterality: N/A;    There were no vitals filed for this visit.   Subjective  Assessment - 05/26/20 1234    Subjective No new complaints. No falls or pain to report. Was tired after last session.    Pertinent History T8 paraplegia, diabetes, HTN    How long can you stand comfortably? probably a couple minutes with RW.    How long can you walk comfortably? approx. 5-10 minutes with his son and RW.    Patient Stated Goals wants to be able to walk without any assistance    Currently in Pain? No/denies    Pain Score 0-No pain                [facility] Adult PT Treatment/Exercise - 05/26/20 1235      Transfers   Transfers Sit to Stand;Stand to Sit;Lateral/Scoot Transfers;Stand Pivot Transfers    Sit to Stand 4: Min guard;From elevated surface;With upper extremity assist;From bed;3: Mod assist    Sit to Stand Details Verbal cues for technique;Verbal cues for precautions/safety;Verbal cues for safe use of DME/AE    Sit to Stand Details (indicate cue type and reason) assit to stabilize RW each time as pt places both UE's on the RW.    Stand to Sit 4: Min guard;With upper extremity assist;To bed;To chair/3-in-1    Stand to Sit Details (indicate cue type and reason) Verbal cues for sequencing;Verbal cues for technique;Verbal cues for safe use of DME/AE    Stand to Sit Details cues to back all the way to the mat  each time. Pt able to reach back with left UE to control descent.    Lateral/Scoot Transfers 6: Modified independent (Device/Increase time)    Lateral/Scoot Transfer Details (indicate cue type and reason) wheelchair<>mat table      Ambulation/Gait   Ambulation/Gait Yes    Ambulation/Gait Assistance 4: Min guard;4: Min assist    Ambulation/Gait Assistance Details no buckling with 1st rep of gait with right foot  catching x 3, mostly with turns. cues for posture, hip/knee extension and right step placement, decreased scissoring noted with 1st gait rep. Increased assistance needed toward end of 2cd gait rep (up to mod assist) due to increased hip/knee flexion in stance.  no buckling noted with second rep as well.    Ambulation Distance (Feet) 115 Feet   x2   Assistive device Rolling walker    Gait Pattern Step-to pattern;Decreased weight shift to right;Left flexed knee in stance;Right flexed knee in stance;Narrow base of support;Decreased step length - right;Decreased step length - left;Decreased stance time - right;Decreased hip/knee flexion - right;Step-through pattern    Ambulation Surface Level;Indoor      Knee/Hip Exercises: Seated   Hamstring Curl AROM;Strengthening;Both;1 set;10 reps;Limitations    Hamstring Limitations with foot on towel to allow it to slide on floor with red band resistance on left LE and yellow band resistance on right LE for 1 set of 10 reps each. cues with occaisonal assist for slow, controlled movements.      Knee/Hip Exercises: Supine   Short Arc Quad Sets AROM;Strengthening;Both;2 sets;10 reps;Limitations    Short Arc Quad Sets Limitations 2# ankle weight on left LE, 1# ankle weight on right LE. cues for slow and controlled movements with assist to right LE with lowering at times.    Bridges Limitations with yoga block squeeze between thighs for 2 sets of 10, then with green band resistance for clam shell for 2 sets of 10 reps. assist to stabilize knees needed at times with limited pelvis lift noted at times as well.    Bridges with Diona Foley Squeeze AROM;AAROM;Strengthening;Both;2 sets;10 reps;Limitations    Bridges with Clamshell AROM;AAROM;Strengthening;Both;2 sets;10 reps;Limitations                    PT Short Term Goals - 04/18/20 0915      PT SHORT TERM GOAL #1   Title Pt will perform stand pivot transfer with RW from w/c <> mat table with min A . ALL STGS DUE 05/30/20    Baseline 04/14/20: performed for 1st time today with min/mod assist needed with RW mat<>wheelchair.    Time 6   due to delay in scheduling   Period Weeks    Status New    Target Date 05/30/20      PT SHORT TERM GOAL #2   Title Pt will perform  sit <> stand with min guard from standard height mat table surface in order to demo improved BLE strength and improved transfer ability.    Baseline 04/14/20: min assist for lower surfaces to RW,    Time 6    Period Weeks    Status New      PT SHORT TERM GOAL #3   Title Pt will ambulate at least 115' with RW with min guard/min A from single therapist and w/c follow for safety.    Baseline needs min guard-mod A from single therapist depending on pt's fatigue, needs w/c follow for safety    Time 6    Period Weeks  Status New             PT Long Term Goals - 04/18/20 1696      PT LONG TERM GOAL #1   Title Pt and spouse will be independent with final HEP for BLE stretching and strengthening. ALL LTGS DUE 06/27/20    Baseline 04/14/20: met with current program, will benefit from updates as pt progresses    Time 10   due to delay in scheduling   Period Weeks    Status On-going    Target Date 06/27/20      PT LONG TERM GOAL #2   Title Pt will tolerate static/dynamic standing activities with min guard in RW for at least 4 minutes in order to demo improved standing tolerance.    Baseline 04/14/20: pt able to stand just over 2 minutes at RW with min gaurd assist static/dynamic combined    Time 10    Period Weeks    Status On-going      PT LONG TERM GOAL #3   Title Pt will perform stand pivot transfer with RW from w/c <> mat table with min guard.    Baseline 04/14/20: performed for 1st time today with min/mod assist needed with RW mat<>wheelchair.    Time 10    Period Weeks    Status Revised      PT LONG TERM GOAL #4   Title 5x sit <> stand goal to be written as appropriate in order to demo improved functional BLE strength and balance.    Baseline not yet assessed.    Time 10    Period Weeks    Status New      PT LONG TERM GOAL #5   Title Pt will ambulate at least 70' with RW with single therapist and min guard/min A with no w/c follow in order to demo improved household mobility.     Baseline 115' with min guard-mod A of single therapist and w/c follow    Time 10    Period Weeks    Status New                 Plan - 05/26/20 1235    Clinical Impression Statement Today's skilled session focused on gait and strengthening. Started with gait first in session with no buckling of knees noted due to not as fatigued as they are after ex's. Only one person assist with 1st lap with no standby, On 2cd lap had wheelchair follow with it not needed. No issues noted or reported with ex's as well. The pt is progressing toward goals and should benefit from continued PT to progress toward unmet goals.  From primary PT for 10th visit PN: Pt able to improve gait distance today to 115' x2 reps without a wheel chair follow. Does continue to have R foot catching due to narrow BOS, esp when going around turns.    Personal Factors and Comorbidities Comorbidity 3+;Past/Current Experience    Comorbidities T8 paraplegia, diabetes, HTN    Examination-Activity Limitations Locomotion Level;Transfers;Stand    Examination-Participation Restrictions Community Activity;Yard Work   Community education officer Evolving/Moderate complexity    Rehab Potential Good    PT Frequency 2x / week   1-2x   PT Duration 8 weeks   8 weeks   PT Treatment/Interventions Aquatic Therapy;Manual techniques;Therapeutic exercise;Gait training;Neuromuscular re-education;Orthotic Fit/Training    PT Next Visit Plan seated/supine strengthening. gait training with RW (w/c follow, may not need another person), sit <> stands, pre  gait activities. SciFit, dynamic seated balance (compliant surface)    PT Home Exercise Plan Access Code: [email or web address]    Consulted and Agree with Plan of Care Patient           Patient will benefit from skilled therapeutic intervention in order to improve the following deficits and impairments:  Decreased balance,Decreased coordination,Decreased range of motion,Decreased  strength,Decreased mobility,Postural dysfunction,Impaired tone,Decreased activity tolerance,Abnormal gait,Decreased endurance,Difficulty walking,Impaired sensation  Visit Diagnosis: Muscle weakness (generalized)  Unsteadiness on feet  Difficulty in walking, not elsewhere classified  Other symptoms and signs involving the nervous system  Abnormal posture     Problem List Patient Active Problem List   Diagnosis Date Noted  . Erectile dysfunction due to diseases classified elsewhere 03/02/2020  . 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 Spasticity 08/03/2019  . Paraplegia ([place]) 07/07/2019  . Neurogenic bowel 07/07/2019  . Neurogenic bladder 07/07/2019  . Thoracic myelopathy 06/30/2019    Willow Ora, PTA, [facility] [address], [place], [place] [address] [phone] 05/27/20, 4:43 PM   Name: Timothy Davenport MRN: [id number] Date of Birth: [birth year]   Janann August, PT, DPT 06/01/20 10:39 AM

## 2020-05-31 ENCOUNTER — Encounter: Payer: Self-pay | Admitting: Physical Therapy

## 2020-05-31 ENCOUNTER — Other Ambulatory Visit: Payer: Self-pay

## 2020-05-31 ENCOUNTER — Ambulatory Visit: Payer: No Typology Code available for payment source | Admitting: Physical Therapy

## 2020-05-31 DIAGNOSIS — M6281 Muscle weakness (generalized): Secondary | ICD-10-CM

## 2020-05-31 DIAGNOSIS — R29818 Other symptoms and signs involving the nervous system: Secondary | ICD-10-CM

## 2020-05-31 DIAGNOSIS — R2681 Unsteadiness on feet: Secondary | ICD-10-CM

## 2020-05-31 DIAGNOSIS — R262 Difficulty in walking, not elsewhere classified: Secondary | ICD-10-CM

## 2020-05-31 NOTE — Therapy (Signed)
[facility] [address] [place], [name], [address] Phone: [phone]   Fax:  [phone]  Physical Therapy Treatment  Patient Details  Name: Timothy Davenport MRN: [id number] Date of Birth: 1950-10-24 Referring Provider (PT): Juel Burrow    Encounter Date: 05/31/2020   PT End of Session - 05/31/20 1103    Visit Number 21    Number of Visits 30    Date for PT Re-Evaluation 07/17/20   60 day POC, 90 day cert   Authorization Type VA - 15 visits 02/05/20 - 06/04/20, 15 additional visits from 3/8-08/24/20    Authorization - Visit Number 6    Authorization - Number of Visits 15    Progress Note Due on Visit 20    PT Start Time 1017    PT Stop Time 1058    PT Time Calculation (min) 41 min    Equipment Utilized During Treatment Gait belt    Activity Tolerance Patient tolerated treatment well;Patient limited by fatigue    Behavior During Therapy [facility] for tasks assessed/performed           Past Medical History:  Diagnosis Date  . Colon polyp   . Diabetes mellitus without complication ([place])   . Diabetic neuropathy ([place])   . HTN (hypertension)   . Patella fracture   . Renal calculi     Past Surgical History:  Procedure Laterality Date  . LUMBAR LAMINECTOMY/DECOMPRESSION MICRODISCECTOMY N/A 07/01/2019   Procedure: LUMBAR LAMINECTOMY/DECOMPRESSION MICRODISCECTOMY 1 LEVEL, THORACIC SIX-SEVEN;  Surgeon: Consuella Lose, MD;  Location: [facility];  Service: Neurosurgery;  Laterality: N/A;  LUMBAR LAMINECTOMY/DECOMPRESSION MICRODISCECTOMY 1 LEVEL, THORACIC SIX-SEVEN   . ORIF PATELLA    . THORACIC DISCECTOMY N/A 07/03/2019   Procedure: Evacuation of Thoracic Hematoma;  Surgeon: Consuella Lose, MD;  Location: [facility];  Service: Neurosurgery;  Laterality: N/A;    There were no vitals filed for this visit.   Subjective Assessment - 05/31/20 1019    Subjective Doing good, no changes.    Pertinent History T8 paraplegia,  diabetes, HTN    How long can you stand comfortably? probably a couple minutes with RW.    How long can you walk comfortably? approx. 5-10 minutes with his son and RW.    Patient Stated Goals wants to be able to walk without any assistance    Currently in Pain? No/denies                             [facility] Adult PT Treatment/Exercise - 05/31/20 1027      Transfers   Transfers Sit to Stand;Stand to Sit;Lateral/Scoot Transfers;Stand Pivot Transfers    Sit to Stand 4: Min guard;From elevated surface;With upper extremity assist;From bed;3: Mod assist    Sit to Stand Details Verbal cues for technique;Verbal cues for precautions/safety;Verbal cues for safe use of DME/AE    Sit to Stand Details (indicate cue type and reason) assist to stabilize RW when standing (pt puts BUE support on RW)    Stand to Sit 4: Min guard;With upper extremity assist;To bed;To chair/3-in-1    Stand to Sit Details (indicate cue type and reason) Verbal cues for sequencing;Verbal cues for technique;Verbal cues for safe use of DME/AE    Stand to Sit Details cues for slowed descent    Lateral/Scoot Transfers 6: Modified independent (Device/Increase time)    Lateral/Scoot Transfer Details (indicate cue type and reason) wheelchair <> mat table  Ambulation/Gait   Ambulation/Gait Yes    Ambulation/Gait Assistance 4: Min guard;4: Min assist    Ambulation/Gait Assistance Details pt with decr scissoring during first rep of gait, only 2 instances of getting R foot caught, but pt able to correct on his own. cues for posture throughout. pt needing more assist during 2nd lap of gait and pt with more B knee flexion and incr episodes of more narrow step with RLE. incr assist towards end of 2nd rep while turning to sit back towards mat (mod A) and to help with RW navigation    Ambulation Distance (Feet) 115 Feet   x2   Assistive device Rolling walker    Gait Pattern Step-to pattern;Decreased weight shift to right;Left  flexed knee in stance;Right flexed knee in stance;Narrow base of support;Decreased step length - right;Decreased step length - left;Decreased stance time - right;Decreased hip/knee flexion - right;Step-through pattern    Ambulation Surface Level;Indoor      Neuro Re-ed    Neuro Re-ed Details  seated on green balance disc for strengthening/dynamic seated balance: alternating LAQs 2 x 5 reps B, alternating marching x5 reps B, holding 3 lb medicine ball x5 reps B diagonals in each direction, x10 reps small distance ball toss with PT with 2 lb ball      Knee/Hip Exercises: Supine   Short Arc Quad Sets AROM;Strengthening;Both;2 sets;10 reps;Limitations    Short Arc Quad Sets Limitations 2# ankle weight on left LE, 1# ankle weight on right LE. cues for slow and controlled movements with assist to right LE with lowering at times.    Bridges AROM;Strengthening;Limitations;Both;2 sets;5 reps    Bridges Limitations with red tband around thighs, cues for hip ABD first before lifting    Other Supine Knee/Hip Exercises marching x10 reps B, needing min/mod assist with lifting RLE                    PT Short Term Goals - 05/31/20 1453      PT SHORT TERM GOAL #1   Title Pt will perform stand pivot transfer with RW from w/c <> mat table with min A . ALL STGS DUE 05/30/20    Baseline 04/14/20: performed for 1st time today with min/mod assist needed with RW mat<>wheelchair.    Time 6   due to delay in scheduling   Period Weeks    Status New    Target Date 05/30/20      PT SHORT TERM GOAL #2   Title Pt will perform sit <> stand with min guard from standard height mat table surface in order to demo improved BLE strength and improved transfer ability.    Baseline min guard from lower mat table surfaces, therapist needing to steady RW due to pt having BUE support on it to stand    Time 6    Period Weeks    Status Achieved      PT SHORT TERM GOAL #3   Title Pt will ambulate at least 115' with RW  with min guard/min A from single therapist and w/c follow for safety.    Baseline met on 05/31/20, during 1st lap of gait    Time 6    Period Weeks    Status Achieved             PT Long Term Goals - 04/18/20 6433      PT LONG TERM GOAL #1   Title Pt and spouse will be independent with final HEP for BLE  stretching and strengthening. ALL LTGS DUE 06/27/20    Baseline 04/14/20: met with current program, will benefit from updates as pt progresses    Time 10   due to delay in scheduling   Period Weeks    Status On-going    Target Date 06/27/20      PT LONG TERM GOAL #2   Title Pt will tolerate static/dynamic standing activities with min guard in RW for at least 4 minutes in order to demo improved standing tolerance.    Baseline 04/14/20: pt able to stand just over 2 minutes at RW with min gaurd assist static/dynamic combined    Time 10    Period Weeks    Status On-going      PT LONG TERM GOAL #3   Title Pt will perform stand pivot transfer with RW from w/c <> mat table with min guard.    Baseline 04/14/20: performed for 1st time today with min/mod assist needed with RW mat<>wheelchair.    Time 10    Period Weeks    Status Revised      PT LONG TERM GOAL #4   Title 5x sit <> stand goal to be written as appropriate in order to demo improved functional BLE strength and balance.    Baseline not yet assessed.    Time 10    Period Weeks    Status New      PT LONG TERM GOAL #5   Title Pt will ambulate at least 47' with RW with single therapist and min guard/min A with no w/c follow in order to demo improved household mobility.    Baseline 115' with min guard-mod A of single therapist and w/c follow    Time 10    Period Weeks    Status New               Plan - 05/31/20 1459    Clinical Impression Statement Pt achieved STG #2 and #3 today in regards to sit <> stand and gait with RW. Pt able to ambulate first 115' lap of gait with RW with min guard/min A with pt having less  instances of scissoring with RLE.   W/c follow not needed today with either bout of gait and only 1 PT providing assist. Will continue to progress towards LTGs.    Personal Factors and Comorbidities Comorbidity 3+;Past/Current Experience    Comorbidities T8 paraplegia, diabetes, HTN    Examination-Activity Limitations Locomotion Level;Transfers;Stand    Examination-Participation Restrictions Community Activity;Yard Work   Community education officer Evolving/Moderate complexity    Rehab Potential Good    PT Frequency 2x / week   1-2x   PT Duration 8 weeks   8 weeks   PT Treatment/Interventions Aquatic Therapy;Manual techniques;Therapeutic exercise;Gait training;Neuromuscular re-education;Orthotic Fit/Training    PT Next Visit Plan check last STG. seated/supine strengthening. gait training with RW (w/c follow just in case). sit <> stands, pre gait activities. SciFit, dynamic seated balance (compliant surface)    PT Home Exercise Plan Access Code: [email or web address]    Consulted and Agree with Plan of Care Patient           Patient will benefit from skilled therapeutic intervention in order to improve the following deficits and impairments:  Decreased balance,Decreased coordination,Decreased range of motion,Decreased strength,Decreased mobility,Postural dysfunction,Impaired tone,Decreased activity tolerance,Abnormal gait,Decreased endurance,Difficulty walking,Impaired sensation  Visit Diagnosis: Unsteadiness on feet  Difficulty in walking, not elsewhere classified  Muscle weakness (generalized)  Other symptoms and signs involving the nervous  system     Problem List Patient Active Problem List   Diagnosis Date Noted  . Erectile dysfunction due to diseases classified elsewhere 03/02/2020  . 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 Spasticity 08/03/2019  . Paraplegia ([place]) 07/07/2019  . Neurogenic bowel 07/07/2019  . Neurogenic bladder 07/07/2019  . Thoracic myelopathy  06/30/2019    Arliss Journey, PT, DPT 05/31/2020, 3:00 PM  [facility] [address] [place], [name], [address] Phone: [phone]   Fax:  [phone]  Name: Timothy Davenport MRN: [id number] Date of Birth: 03-Sep-1950

## 2020-06-01 ENCOUNTER — Encounter: Payer: Self-pay | Admitting: Physical Medicine and Rehabilitation

## 2020-06-01 ENCOUNTER — Encounter
Payer: BC Managed Care – PPO | Attending: Physical Medicine and Rehabilitation | Admitting: Physical Medicine and Rehabilitation

## 2020-06-01 ENCOUNTER — Other Ambulatory Visit: Payer: Self-pay

## 2020-06-01 VITALS — BP 104/69 | HR 65 | Temp 98.1°F | Ht 71.0 in | Wt 183.4 lb

## 2020-06-01 DIAGNOSIS — M4714 Other spondylosis with myelopathy, thoracic region: Secondary | ICD-10-CM | POA: Diagnosis not present

## 2020-06-01 DIAGNOSIS — G8220 Paraplegia, unspecified: Secondary | ICD-10-CM | POA: Insufficient documentation

## 2020-06-01 DIAGNOSIS — K592 Neurogenic bowel, not elsewhere classified: Secondary | ICD-10-CM | POA: Diagnosis present

## 2020-06-01 DIAGNOSIS — N319 Neuromuscular dysfunction of bladder, unspecified: Secondary | ICD-10-CM | POA: Diagnosis not present

## 2020-06-01 NOTE — Patient Instructions (Signed)
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 paraplegia- neurogenic bowel and bladder and resolving  spasticity here for f/u on SCI..   1. Goal to get hand controls on truck so can be more independent.   2. Con't- Bowel program nightly- however if starts to have regular BMs without suppository and has control over them, - might be able to come off bowel program.   3. Still cathing q 4 hours- suggest talking to [facility] about possible spontaneous voiding - when go back for 6 month check.   4.  No changes in meds today- per [facility] urology.   5. F/U in 6 months but call if has any issues.

## 2020-06-01 NOTE — Progress Notes (Signed)
Subjective:    Patient ID: Timothy Davenport, male    DOB: 09-20-1950, 70 y.o.   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  Doesn't take w/c last time with no assistance- no w/c follow either yesterday.   Has gotten on insulin now- SSI- no oral meds currently.  Until 4/18- when has f/u with PCP.  They will decide if needs to go on something else at that time.   everything is going so well. D/c'd from Dr Kathyrn Sheriff- [facility] - so things are going THAT well.   Intimacy- wife is less on board.  So hasn't tried Viagra.   BP is doing well- no issues  Has been tested with hand control at [facility]- and passed the test/training- and so plans on getting hand controls for truck.  [facility] plans to help to put in hand controls.   Spasms are almost completely gone- except a little at night.  Pulls up in bed- has spasms.   About 3am, felt like needed ot have BM- has spontaneous BM- this AM without suppository  Has had this occur with voiding as well.  Mainly when bladder real full.  Has to finish with cath after voiding some.    Pain Inventory Average Pain 1 Pain Right Now 0 My pain is na  LOCATION OF PAIN  na  BOWEL Number of stools per week: 5 Oral laxative use No  Type of laxative na Enema or suppository use Yes  History of colostomy No  Incontinent No   BLADDER Suprapubic In and out cath, frequency 4hrs Able to self cath Yes  Bladder incontinence No  Frequent urination No  Leakage with coughing No  Difficulty starting stream No  Incomplete bladder emptying No    Mobility walk with assistance ability to climb steps?  no do you drive?  no use a wheelchair  Function retired I need assistance with the following:  meal prep, household duties and shopping  Neuro/Psych No problems in this area  Prior  Studies Any changes since last visit?  no  Physicians involved in your care Any changes since last visit?  no   Family History  Problem Relation Age of Onset  . Hypertension Mother   . Hypertension Father    Social History   Socioeconomic History  . Marital status: Married    Spouse name: Not on file  . Number of children: Not on file  . Years of education: Not on file  . Highest education level: Not on file  Occupational History  . Not on file  Tobacco Use  . Smoking status: Never Smoker  . Smokeless tobacco: Never Used  Vaping Use  . Vaping Use: Never used  Substance and Sexual Activity  . Alcohol use: Yes    Comment: 2-3 drinks week  . Drug use: Yes    Types: Marijuana    Comment: occasionally  . Sexual activity: Not Currently  Other Topics Concern  . Not on file  Social History Narrative  . Not on file   Social Determinants of Health   Financial Resource Strain: Not on file  Food Insecurity: Not on file  Transportation Needs: Not on file  Physical Activity: Not on file  Stress: Not on file  Social Connections: Not on file   Past Surgical  History:  Procedure Laterality Date  . LUMBAR LAMINECTOMY/DECOMPRESSION MICRODISCECTOMY N/A 07/01/2019   Procedure: LUMBAR LAMINECTOMY/DECOMPRESSION MICRODISCECTOMY 1 LEVEL, THORACIC SIX-SEVEN;  Surgeon: Consuella Lose, MD;  Location: [place];  Service: Neurosurgery;  Laterality: N/A;  LUMBAR LAMINECTOMY/DECOMPRESSION MICRODISCECTOMY 1 LEVEL, THORACIC SIX-SEVEN   . ORIF PATELLA    . THORACIC DISCECTOMY N/A 07/03/2019   Procedure: Evacuation of Thoracic Hematoma;  Surgeon: Consuella Lose, MD;  Location: [place];  Service: Neurosurgery;  Laterality: N/A;   Past Medical History:  Diagnosis Date  . Colon polyp   . Diabetes mellitus without complication ([place])   . Diabetic neuropathy ([place])   . HTN (hypertension)   . Patella fracture   . Renal calculi    BP 104/69   Pulse 65   Temp 98.1 F (36.7 C)   Ht 5\' 11"   (1.803 m)   Wt 183 lb 6.4 oz (83.2 kg)   SpO2 97%   BMI 25.58 kg/m   Opioid Risk Score:   Fall Risk Score:  `1  Depression screen PHQ 2/9  Depression screen [facility] 2/9 03/02/2020 11/27/2019 08/03/2019  Decreased Interest 1 1 3   Down, Depressed, Hopeless 1 1 0  PHQ - 2 Score 2 2 3   Altered sleeping - - 0  Tired, decreased energy - - 0  Change in appetite - - 1  Feeling bad or failure about yourself  - - 0  Trouble concentrating - - 0  Moving slowly or fidgety/restless - - 0  Suicidal thoughts - - 0  PHQ-9 Score - - 4  Difficult doing work/chores - - Not difficult at all     Review of Systems An entire ROS was completed and found to be negative except for HPI    Objective:   Physical Exam  Awake, alert, in manual w/c, alone today, NAD  LEs:  RLE- HF 2/5, KE 4/5, DF 3/5 and PF 4/5 LLE- HF 4/5, KE 5/5 DF 4/5 and PF 5/5  Neuro:  Intact to light touch in all 4 extremities B/L No significant tone seen in LEs.      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 paraplegia- neurogenic bowel and bladder and resolving  spasticity here for f/u on SCI..   1. Goal to get hand controls on truck so can be more independent.   2. Con't- Bowel program nightly- however if starts to have regular BMs without suppository and has control over them, - might be able to come off bowel program.   3. Still cathing q 4 hours- suggest talking to [facility] about possible spontaneous voiding - when go back for 6 month check.   4.  No changes in meds today- per [facility].   5. F/U in 6 months but call if has any issues.   I spent a total of 25 minutes on visit- discussing bowel and bladder and how sounds like he's getting return and how to handle that.

## 2020-06-02 ENCOUNTER — Encounter: Payer: Self-pay | Admitting: Physical Therapy

## 2020-06-02 ENCOUNTER — Ambulatory Visit: Payer: No Typology Code available for payment source | Admitting: Physical Therapy

## 2020-06-02 DIAGNOSIS — R2681 Unsteadiness on feet: Secondary | ICD-10-CM

## 2020-06-02 DIAGNOSIS — R262 Difficulty in walking, not elsewhere classified: Secondary | ICD-10-CM

## 2020-06-02 DIAGNOSIS — R29818 Other symptoms and signs involving the nervous system: Secondary | ICD-10-CM

## 2020-06-02 DIAGNOSIS — R293 Abnormal posture: Secondary | ICD-10-CM

## 2020-06-02 DIAGNOSIS — M6281 Muscle weakness (generalized): Secondary | ICD-10-CM | POA: Diagnosis not present

## 2020-06-03 NOTE — Therapy (Signed)
[facility] [address] [place], [name], [address] Phone: [phone]   Fax:  [phone]  Physical Therapy Treatment  Patient Details  Name: Timothy Davenport MRN: [id number] Date of Birth: 1951-01-08 Referring Provider (PT): Juel Burrow    Encounter Date: 06/02/2020   PT End of Session - 06/02/20 1021    Visit Number 22    Number of Visits 30    Date for PT Re-Evaluation 07/17/20   60 day POC, 90 day cert   Authorization Type [facility] - 15 visits 02/05/20 - 06/04/20, 15 additional visits from 3/8-08/24/20    Authorization - Visit Number 7    Authorization - Number of Visits 15    Progress Note Due on Visit 20    PT Start Time 1018    PT Stop Time 1100    PT Time Calculation (min) 42 min    Equipment Utilized During Treatment Gait belt    Activity Tolerance Patient tolerated treatment well;Patient limited by fatigue    Behavior During Therapy [facility] for tasks assessed/performed           Past Medical History:  Diagnosis Date  . Colon polyp   . Diabetes mellitus without complication ([place])   . Diabetic neuropathy ([place])   . HTN (hypertension)   . Patella fracture   . Renal calculi     Past Surgical History:  Procedure Laterality Date  . LUMBAR LAMINECTOMY/DECOMPRESSION MICRODISCECTOMY N/A 07/01/2019   Procedure: LUMBAR LAMINECTOMY/DECOMPRESSION MICRODISCECTOMY 1 LEVEL, THORACIC SIX-SEVEN;  Surgeon: Consuella Lose, MD;  Location: [place];  Service: Neurosurgery;  Laterality: N/A;  LUMBAR LAMINECTOMY/DECOMPRESSION MICRODISCECTOMY 1 LEVEL, THORACIC SIX-SEVEN   . ORIF PATELLA    . THORACIC DISCECTOMY N/A 07/03/2019   Procedure: Evacuation of Thoracic Hematoma;  Surgeon: Consuella Lose, MD;  Location: [place];  Service: Neurosurgery;  Laterality: N/A;    There were no vitals filed for this visit.   Subjective Assessment - 06/02/20 1020    Subjective No new complaints. No falls. Dr. Dagoberto Ligas yesterday and got a great  report. She has moved his classification from [place] C to [place] her again in 6 months unless something comes up.    Pertinent History T8 paraplegia, diabetes, HTN    How long can you stand comfortably? probably a couple minutes with RW.    Currently in Pain? No/denies    Pain Score 0-No pain              06/02/20 1023  Transfers  Transfers Stand to [employer] Transfers  Sit to Stand 4: Min guard;From elevated surface;With upper extremity assist;From bed;3: Mod assist  Sit to Stand Details Verbal cues for technique;Verbal cues for precautions/safety;Verbal cues for safe use of DME/AE  Sit to Stand Details (indicate cue type and reason) assist to stabilize RW only when standing from mat>RW as pt places both hands on walker. min/mod assist to stand from lower wheelchair>RW due to height and placement of foot plate prevents pt from getting feet back under him.  Stand to Sit 4: Min guard;With upper extremity assist;To bed;To chair/3-in-1  Stand to Sit Details (indicate cue type and reason) Verbal cues for sequencing;Verbal cues for technique;Verbal cues for safe use of DME/AE  Stand to Sit Details pt able to reach back to wheelchair with left UE with sitting, needing assist for safety cue to unable to get close due to foot plate on wheelchair. attempted with second rep to step left foot up onto plate to  get closer, however this caused the wheelchair to move/roll sideways needing assist to stabilize wheelchair with sitting.  Stand Pivot Transfers 4: Min guard  Stand Pivot Transfer Details (indicate cue type and reason) with RW to take pivot steps mat<>wheelchair  Lateral/Scoot Transfers 6: Modified independent (Device/Increase time)  Lateral/Scoot Transfer Details (indicate cue type and reason) wheelchair>mat table  Ambulation/Gait  Ambulation/Gait Yes  Ambulation/Gait Assistance 4: Min guard;4: Min assist  Ambulation/Gait Assistance Details continues with decreased scissoring with  improved right foot placement with swing phase. continued cues for posture and hip/knee extension in stance phase of gait with bil LE's. Increased assist needed toward end of 2cd gait rep due to right foot scuffing more, continued with improved step placement.  Ambulation Distance (Feet) 115 Feet (x2)  Assistive device Rolling walker  Gait Pattern Step-to pattern;Decreased weight shift to right;Left flexed knee in stance;Right flexed knee in stance;Narrow base of support;Decreased step length - right;Decreased step length - left;Decreased stance time - right;Decreased hip/knee flexion - right;Step-through pattern  Ambulation Surface Level;Indoor  Knee/Hip Exercises: Supine  Short Arc Quad Sets AROM;Strengthening;Both;2 sets;10 reps;Limitations  Short Arc Quad Sets Limitations 2#ankle weight on left, 1# weight on right LE. cues for slow and controlled movements  Bridges Limitations with yoga block squeeze for 10 reps with arms at sides, cues for increased pelvci raise; then with red band resistance- cues to stretch band and hold throughout bridge motion with arms at sides for 10 reps.  Bridges with Greig Right AROM;Strengthening;Both;2 sets;10 reps;Limitations;AAROM  Bridges with Clamshell AROM;AAROM;Strengthening;Both;10 reps;Limitations;1 set            PT Short Term Goals - 06/02/20 1022      PT SHORT TERM GOAL #1   Title Pt will perform stand pivot transfer with RW from w/c <> mat table with min A . ALL STGS DUE 05/30/20    Baseline 04/14/20: performed for 1st time today with min/mod assist needed with RW mat<>wheelchair.    Time 6   due to delay in scheduling   Period Weeks    Status New    Target Date 05/30/20      PT SHORT TERM GOAL #2   Title Pt will perform sit <> stand with min guard from standard height mat table surface in order to demo improved BLE strength and improved transfer ability.    Baseline min guard from lower mat table surfaces, therapist needing to steady RW  due to pt having BUE support on it to stand    Status Achieved      PT SHORT TERM GOAL #3   Title Pt will ambulate at least 115' with RW with min guard/min A from single therapist and w/c follow for safety.    Baseline met on 05/31/20, during 1st lap of gait    Status Achieved             PT Long Term Goals - 04/18/20 7846      PT LONG TERM GOAL #1   Title Pt and spouse will be independent with final HEP for BLE stretching and strengthening. ALL LTGS DUE 06/27/20    Baseline 04/14/20: met with current program, will benefit from updates as pt progresses    Time 10   due to delay in scheduling   Period Weeks    Status On-going    Target Date 06/27/20      PT LONG TERM GOAL #2   Title Pt will tolerate static/dynamic standing activities with min guard in RW  for at least 4 minutes in order to demo improved standing tolerance.    Baseline 04/14/20: pt able to stand just over 2 minutes at RW with min gaurd assist static/dynamic combined    Time 10    Period Weeks    Status On-going      PT LONG TERM GOAL #3   Title Pt will perform stand pivot transfer with RW from w/c <> mat table with min guard.    Baseline 04/14/20: performed for 1st time today with min/mod assist needed with RW mat<>wheelchair.    Time 10    Period Weeks    Status Revised      PT LONG TERM GOAL #4   Title 5x sit <> stand goal to be written as appropriate in order to demo improved functional BLE strength and balance.    Baseline not yet assessed.    Time 10    Period Weeks    Status New      PT LONG TERM GOAL #5   Title Pt will ambulate at least 58' with RW with single therapist and min guard/min A with no w/c follow in order to demo improved household mobility.    Baseline 115' with min guard-mod A of single therapist and w/c follow    Time 10    Period Weeks    Status New             06/02/20 1022  Plan  Clinical Impression Statement Today's skilled session continued to focus on gait with RW,  transfers and strengthening with no issues noted or reported in session. One person assist with both gait reps with no wheelchair follow needed this session. The pt continues to make steady progress toward goals and should benefit from continued PT to progress toward unmet goals.  Personal Factors and Comorbidities Comorbidity 3+;Past/Current Experience  Comorbidities T8 paraplegia, diabetes, HTN  Examination-Activity Limitations Locomotion Level;Transfers;Stand  Examination-Participation Restrictions Community Activity;Yard Work (gardening)  Pt will benefit from skilled therapeutic intervention in order to improve on the following deficits Decreased balance;Decreased coordination;Decreased range of motion;Decreased strength;Decreased mobility;Postural dysfunction;Impaired tone;Decreased activity tolerance;Abnormal gait;Decreased endurance;Difficulty walking;Impaired sensation  Stability/Clinical Decision Making Evolving/Moderate complexity  Rehab Potential Good  PT Frequency 2x / week (1-2x)  PT Duration 8 weeks (8 weeks)  PT Treatment/Interventions Aquatic Therapy;Manual techniques;Therapeutic exercise;Gait training;Neuromuscular re-education;Orthotic Fit/Training  PT Next Visit Plan seated/supine strengthening. gait training with RW (w/c follow just in case). sit <> stands, pre gait activities. SciFit, dynamic seated balance (compliant surface)  PT Home Exercise Plan Access Code: [email or web address]  Consulted and Agree with Plan of Care Patient          Patient will benefit from skilled therapeutic intervention in order to improve the following deficits and impairments:  Decreased balance,Decreased coordination,Decreased range of motion,Decreased strength,Decreased mobility,Postural dysfunction,Impaired tone,Decreased activity tolerance,Abnormal gait,Decreased endurance,Difficulty walking,Impaired sensation  Visit Diagnosis: Unsteadiness on feet  Difficulty in walking, not elsewhere  classified  Muscle weakness (generalized)  Other symptoms and signs involving the nervous system  Abnormal posture     Problem List Patient Active Problem List   Diagnosis Date Noted  . Erectile dysfunction due to diseases classified elsewhere 03/02/2020  . 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 Spasticity 08/03/2019  . Paraplegia ([place]) 07/07/2019  . Neurogenic bowel 07/07/2019  . Neurogenic bladder 07/07/2019  . 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

## 2020-06-07 ENCOUNTER — Other Ambulatory Visit: Payer: Self-pay

## 2020-06-07 ENCOUNTER — Ambulatory Visit: Payer: No Typology Code available for payment source | Admitting: Physical Therapy

## 2020-06-07 ENCOUNTER — Encounter: Payer: Self-pay | Admitting: Physical Therapy

## 2020-06-07 DIAGNOSIS — M6281 Muscle weakness (generalized): Secondary | ICD-10-CM | POA: Diagnosis not present

## 2020-06-07 DIAGNOSIS — R262 Difficulty in walking, not elsewhere classified: Secondary | ICD-10-CM

## 2020-06-07 DIAGNOSIS — R29818 Other symptoms and signs involving the nervous system: Secondary | ICD-10-CM

## 2020-06-07 DIAGNOSIS — R2681 Unsteadiness on feet: Secondary | ICD-10-CM

## 2020-06-07 NOTE — Therapy (Signed)
[facility] [address] [place], [name], [address] Phone: [phone]   Fax:  [phone]  Physical Therapy Treatment  Patient Details  Name: Timothy Davenport MRN: [id number] Date of Birth: November 14, 1950 Referring Provider (PT): Juel Burrow    Encounter Date: 06/07/2020   PT End of Session - 06/07/20 1114    Visit Number 23    Number of Visits 30    Date for PT Re-Evaluation 07/17/20   60 day POC, 90 day cert   Authorization Type VA - 15 visits 02/05/20 - 06/04/20, 15 additional visits from 3/8-08/24/20    Authorization - Visit Number 8    Authorization - Number of Visits 15    Progress Note Due on Visit 20    PT Start Time 1012    PT Stop Time 1055    PT Time Calculation (min) 43 min    Equipment Utilized During Treatment Gait belt    Activity Tolerance Patient tolerated treatment well    Behavior During Therapy WFL for tasks assessed/performed           Past Medical History:  Diagnosis Date  . Colon polyp   . Diabetes mellitus without complication ([place])   . Diabetic neuropathy ([place])   . HTN (hypertension)   . Patella fracture   . Renal calculi     Past Surgical History:  Procedure Laterality Date  . LUMBAR LAMINECTOMY/DECOMPRESSION MICRODISCECTOMY N/A 07/01/2019   Procedure: LUMBAR LAMINECTOMY/DECOMPRESSION MICRODISCECTOMY 1 LEVEL, THORACIC SIX-SEVEN;  Surgeon: Consuella Lose, MD;  Location: [facility];  Service: Neurosurgery;  Laterality: N/A;  LUMBAR LAMINECTOMY/DECOMPRESSION MICRODISCECTOMY 1 LEVEL, THORACIC SIX-SEVEN   . ORIF PATELLA    . THORACIC DISCECTOMY N/A 07/03/2019   Procedure: Evacuation of Thoracic Hematoma;  Surgeon: Consuella Lose, MD;  Location: [facility];  Service: Neurosurgery;  Laterality: N/A;    There were no vitals filed for this visit.   Subjective Assessment - 06/07/20 1013    Subjective Had a good weekend. No other changes.    Pertinent History T8 paraplegia, diabetes, HTN     How long can you stand comfortably? probably a couple minutes with RW.    Patient Stated Goals wants to be able to walk without any assistance    Currently in Pain? No/denies                             [facility]Exercise - 06/07/20 0001      Transfers   Transfers Stand to Sit;Stand Pivot Transfers    Sit to Stand 4: Min guard;From elevated surface;With upper extremity assist;From bed;3: Mod assist    Sit to Stand Details Verbal cues for technique;Verbal cues for precautions/safety;Verbal cues for safe use of DME/AE    Sit to Stand Details (indicate cue type and reason) assist to stabilize RW as pt performs x2 reps with BUE support on RW, performed from a slightly elevated mat table x3 reps with UE support on mat and other on RW without assist needed to stabilize    Stand to Sit 4: Min guard;With upper extremity assist;To bed;To chair/3-in-1    Stand to Sit Details (indicate cue type and reason) Verbal cues for sequencing;Verbal cues for technique;Verbal cues for safe use of DME/AE    Stand to Sit Details x5 reps total throughout session, cues to reach back    Lateral/Scoot Transfers 6: Modified independent (Device/Increase time)    Lateral/Scoot Transfer Details (indicate cue  type and reason) w/c <> mat table      Ambulation/Gait   Ambulation/Gait Yes    Ambulation/Gait Assistance 4: Min guard;4: Min assist    Ambulation/Gait Assistance Details pt with decr scissoring with RLE during 1st lap of gait, with a few instances during the 2nd lap with incr foot scuffing with incr assist needed. cues throughout for more erect posture and for R foot placement    Ambulation Distance (Feet) 115 Feet   x2   Assistive device Rolling walker    Gait Pattern Step-to pattern;Decreased weight shift to right;Left flexed knee in stance;Right flexed knee in stance;Narrow base of support;Decreased step length - right;Decreased step length - left;Decreased stance time -  right;Decreased hip/knee flexion - right;Step-through pattern    Ambulation Surface Level;Indoor      Knee/Hip Exercises: Aerobic   Stepper Scifit with BLE only at level 3.3 for 3 minutes and 3.5 with BLUE/BUE, needing mod A of therapist to help prevent w/c from tipping (when using BLE only) with wedges under wheels and anti -tippers down as well      Knee/Hip Exercises: Seated   Long Arc Quad AROM;AAROM;Strengthening;Both;1 set;10 reps;Limitations    Long Arc Quad Limitations assist needed to achieve full range for RLE, with 3 second holds    [facility] with yoga block with 3 second holds x10 reps      Knee/Hip Exercises: Supine   Other Supine Knee/Hip Exercises bent knee fall outs  x10 reps bil with red tband, cues for slowed and controlled movements and tactile cues for ROM. marching/hip flexion x10 reps B, needing min/mod assist with lifting RLE    Other Supine Knee/Hip Exercises isometric B hip ABD into red tband with 3 second holds (cues for counting) x10 reps, cues for incr RLE activation                  PT Education - 06/07/20 1112    Education Details discussed use of toe cap added for RLE to assist for foot clearance when more fatigued - pt reports he got his shoes from [employer], pt to reach out to [employer] and figure out if the [place] will cover it.    Person(s) Educated Patient    Methods Explanation    Comprehension Verbalized understanding            PT Short Term Goals - 06/02/20 1022      PT SHORT TERM GOAL #1   Title Pt will perform stand pivot transfer with RW from w/c <> mat table with min A . ALL STGS DUE 05/30/20    Baseline 04/14/20: performed for 1st time today with min/mod assist needed with RW mat<>wheelchair.    Time 6   due to delay in scheduling   Period Weeks    Status New    Target Date 05/30/20      PT SHORT TERM GOAL #2   Title Pt will perform sit <> stand with min guard from standard height mat table surface in order to demo improved BLE  strength and improved transfer ability.    Baseline min guard from lower mat table surfaces, therapist needing to steady RW due to pt having BUE support on it to stand    Status Achieved      PT SHORT TERM GOAL #3   Title Pt will ambulate at least 115' with RW with min guard/min A from single therapist and w/c follow for safety.    Baseline met on 05/31/20, during  1st lap of gait    Status Achieved             PT Long Term Goals - 04/18/20 9030      PT LONG TERM GOAL #1   Title Pt and spouse will be independent with final HEP for BLE stretching and strengthening. ALL LTGS DUE 06/27/20    Baseline 04/14/20: met with current program, will benefit from updates as pt progresses    Time 10   due to delay in scheduling   Period Weeks    Status On-going    Target Date 06/27/20      PT LONG TERM GOAL #2   Title Pt will tolerate static/dynamic standing activities with min guard in RW for at least 4 minutes in order to demo improved standing tolerance.    Baseline 04/14/20: pt able to stand just over 2 minutes at RW with min gaurd assist static/dynamic combined    Time 10    Period Weeks    Status On-going      PT LONG TERM GOAL #3   Title Pt will perform stand pivot transfer with RW from w/c <> mat table with min guard.    Baseline 04/14/20: performed for 1st time today with min/mod assist needed with RW mat<>wheelchair.    Time 10    Period Weeks    Status Revised      PT LONG TERM GOAL #4   Title 5x sit <> stand goal to be written as appropriate in order to demo improved functional BLE strength and balance.    Baseline not yet assessed.    Time 10    Period Weeks    Status New      PT LONG TERM GOAL #5   Title Pt will ambulate at least 36' with RW with single therapist and min guard/min A with no w/c follow in order to demo improved household mobility.    Baseline 115' with min guard-mod A of single therapist and w/c follow    Time 10    Period Weeks    Status New                  Plan - 06/07/20 1123    Clinical Impression Statement Today's skilled session continued to focus on BLE strength, sit <> stands, and gait with RW. Pt able to perform sit <> stand from a slightly elevated mat table with single UE from mat and other on RW, with therapist not needing to help stabilize RW. Pt continued to need one person assist with gait both reps and no w/c follow needed. Pt demonstrated incr RLE foot scuffing during 2nd lap of gait, pt going to reach out to [employer] regarding toe cap for RLE. Will continue to progress towards LTGs.    Personal Factors and Comorbidities Comorbidity 3+;Past/Current Experience    Comorbidities T8 paraplegia, diabetes, HTN    Examination-Activity Limitations Locomotion Level;Transfers;Stand    Examination-Participation Restrictions Community Activity;Yard Work   Community education officer Evolving/Moderate complexity    Rehab Potential Good    PT Frequency 2x / week   1-2x   PT Duration 8 weeks   8 weeks   PT Treatment/Interventions Aquatic Therapy;Manual techniques;Therapeutic exercise;Gait training;Neuromuscular re-education;Orthotic Fit/Training    PT Next Visit Plan seated/supine strengthening. gait training with RW (w/c follow just in case). sit <> stands, pre gait activities. SciFit, dynamic seated balance (compliant surface)    PT Home Exercise Plan Access Code: [email or web address]  Consulted and Agree with Plan of Care Patient           Patient will benefit from skilled therapeutic intervention in order to improve the following deficits and impairments:  Decreased balance,Decreased coordination,Decreased range of motion,Decreased strength,Decreased mobility,Postural dysfunction,Impaired tone,Decreased activity tolerance,Abnormal gait,Decreased endurance,Difficulty walking,Impaired sensation  Visit Diagnosis: Unsteadiness on feet  Difficulty in walking, not elsewhere classified  Muscle weakness  (generalized)  Other symptoms and signs involving the nervous system     Problem List Patient Active Problem List   Diagnosis Date Noted  . Erectile dysfunction due to diseases classified elsewhere 03/02/2020  . 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 Spasticity 08/03/2019  . Paraplegia ([place]) 07/07/2019  . Neurogenic bowel 07/07/2019  . Neurogenic bladder 07/07/2019  . 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 [place], [name], [address] Phone: [phone]   Fax:  [phone]  Name: Timothy Davenport MRN: [id number] Date of Birth: [birth year]

## 2020-06-09 ENCOUNTER — Encounter: Payer: Self-pay | Admitting: Physical Therapy

## 2020-06-09 ENCOUNTER — Ambulatory Visit: Payer: No Typology Code available for payment source | Admitting: Physical Therapy

## 2020-06-09 ENCOUNTER — Other Ambulatory Visit: Payer: Self-pay

## 2020-06-09 DIAGNOSIS — R262 Difficulty in walking, not elsewhere classified: Secondary | ICD-10-CM

## 2020-06-09 DIAGNOSIS — M6281 Muscle weakness (generalized): Secondary | ICD-10-CM | POA: Diagnosis not present

## 2020-06-09 DIAGNOSIS — R29818 Other symptoms and signs involving the nervous system: Secondary | ICD-10-CM

## 2020-06-09 DIAGNOSIS — R2681 Unsteadiness on feet: Secondary | ICD-10-CM

## 2020-06-09 NOTE — Therapy (Signed)
[facility] [address] [place], [name], [address] Phone: [phone]   Fax:  [phone]  Physical Therapy Treatment  Patient Details  Name: Timothy Davenport MRN: [id number] Date of Birth: 1950/07/14 Referring Provider (PT): Juel Burrow    Encounter Date: 06/09/2020   PT End of Session - 06/09/20 1158    Visit Number 24    Number of Visits 30    Date for PT Re-Evaluation 07/17/20   60 day POC, 90 day cert   Authorization Type VA - 15 visits 02/05/20 - 06/04/20, 15 additional visits from 3/8-08/24/20    Authorization - Visit Number 9    Authorization - Number of Visits 15    Progress Note Due on Visit 20    PT Start Time 1016    PT Stop Time 1058    PT Time Calculation (min) 42 min    Equipment Utilized During Treatment Gait belt    Activity Tolerance Patient tolerated treatment well    Behavior During Therapy WFL for tasks assessed/performed           Past Medical History:  Diagnosis Date  . Colon polyp   . Diabetes mellitus without complication ([place])   . Diabetic neuropathy ([place])   . HTN (hypertension)   . Patella fracture   . Renal calculi     Past Surgical History:  Procedure Laterality Date  . LUMBAR LAMINECTOMY/DECOMPRESSION MICRODISCECTOMY N/A 07/01/2019   Procedure: LUMBAR LAMINECTOMY/DECOMPRESSION MICRODISCECTOMY 1 LEVEL, THORACIC SIX-SEVEN;  Surgeon: Consuella Lose, MD;  Location: [facility];  Service: Neurosurgery;  Laterality: N/A;  LUMBAR LAMINECTOMY/DECOMPRESSION MICRODISCECTOMY 1 LEVEL, THORACIC SIX-SEVEN   . ORIF PATELLA    . THORACIC DISCECTOMY N/A 07/03/2019   Procedure: Evacuation of Thoracic Hematoma;  Surgeon: Consuella Lose, MD;  Location: [facility];  Service: Neurosurgery;  Laterality: N/A;    There were no vitals filed for this visit.   Subjective Assessment - 06/09/20 1019    Subjective No changes since he was last here.    Pertinent History T8 paraplegia, diabetes, HTN    How  long can you stand comfortably? probably a couple minutes with RW.    Patient Stated Goals wants to be able to walk without any assistance    Currently in Pain? No/denies                             [facility] Adult PT Treatment/Exercise - 06/09/20 1025      Transfers   Transfers Stand to [employer] Transfers    Sit to Stand 4: Min guard;From elevated surface;With upper extremity assist;From bed;4: Min assist    Sit to Stand Details Verbal cues for technique;Verbal cues for precautions/safety;Verbal cues for safe use of DME/AE    Sit to Stand Details (indicate cue type and reason) cues to perform with one hand from mat and one hand on RW, approx 4 reps performed throughout session, needs min A for initial rep    Stand to Sit 4: Min guard;With upper extremity assist;To bed;To chair/3-in-1    Stand to Sit Details (indicate cue type and reason) Verbal cues for sequencing;Verbal cues for technique;Verbal cues for safe use of DME/AE    Stand to Sit Details cues for slowed descent and to reach back towards mat table    Lateral/Scoot Transfers 6: Modified independent (Device/Increase time)    Lateral/Scoot Transfer Details (indicate cue type and reason) from w/c <> mat table  Ambulation/Gait   Ambulation/Gait Yes    Ambulation/Gait Assistance 4: Min guard;4: Min assist    Ambulation/Gait Assistance Details pt with decr scissoring of RLE during 1st lap, incr episodes of R foot catching during 2nd and 3rd lap, needing more assist. cues for RLE foot placement and for posture throughout and hip/knee extension during stance. w/c follow during last 2 reps but not needed. at end of bout of gait, pt turns and pivots with RW towards mat table with min guard    Ambulation Distance (Feet) 115 Feet   x3   Assistive device Rolling walker    Gait Pattern Step-to pattern;Decreased weight shift to right;Left flexed knee in stance;Right flexed knee in stance;Narrow base of support;Decreased  step length - right;Decreased step length - left;Decreased stance time - right;Decreased hip/knee flexion - right;Step-through pattern    Ambulation Surface Level;Indoor    Gait Comments discussed walking at home in hallway with nurse aide walking with pt and using gait belt and wife with w/c follow      Neuro Re-ed    Neuro Re-ed Details  seated on green physioball: x10 reps LAQs with assist from therapist for lowering and full ROM, holding 3.3 lb ball at chest and slowly lowering back to midline and then back to center x10 reps, 2 x5 reps B trunk rotation holding 3.3 lb ball with cues to turn head when also twisting, multi-directional reaching outside of BOS towards target x15 reps without use of UE support on mat for balance                  PT Education - 06/09/20 1039    Education Details [employer] information for pt to call and see about getting a toe cap for R shoe for improved R foot clearance    Person(s) Educated Patient    Methods Explanation    Comprehension Verbalized understanding            PT Short Term Goals - 06/02/20 1022      PT SHORT TERM GOAL #1   Title Pt will perform stand pivot transfer with RW from w/c <> mat table with min A . ALL STGS DUE 05/30/20    Baseline 04/14/20: performed for 1st time today with min/mod assist needed with RW mat<>wheelchair.    Time 6   due to delay in scheduling   Period Weeks    Status New    Target Date 05/30/20      PT SHORT TERM GOAL #2   Title Pt will perform sit <> stand with min guard from standard height mat table surface in order to demo improved BLE strength and improved transfer ability.    Baseline min guard from lower mat table surfaces, therapist needing to steady RW due to pt having BUE support on it to stand    Status Achieved      PT SHORT TERM GOAL #3   Title Pt will ambulate at least 115' with RW with min guard/min A from single therapist and w/c follow for safety.    Baseline met on 05/31/20, during 1st lap  of gait    Status Achieved             PT Long Term Goals - 04/18/20 9741      PT LONG TERM GOAL #1   Title Pt and spouse will be independent with final HEP for BLE stretching and strengthening. ALL LTGS DUE 06/27/20    Baseline 04/14/20: met with current program,  will benefit from updates as pt progresses    Time 10   due to delay in scheduling   Period Weeks    Status On-going    Target Date 06/27/20      PT LONG TERM GOAL #2   Title Pt will tolerate static/dynamic standing activities with min guard in RW for at least 4 minutes in order to demo improved standing tolerance.    Baseline 04/14/20: pt able to stand just over 2 minutes at RW with min gaurd assist static/dynamic combined    Time 10    Period Weeks    Status On-going      PT LONG TERM GOAL #3   Title Pt will perform stand pivot transfer with RW from w/c <> mat table with min guard.    Baseline 04/14/20: performed for 1st time today with min/mod assist needed with RW mat<>wheelchair.    Time 10    Period Weeks    Status Revised      PT LONG TERM GOAL #4   Title 5x sit <> stand goal to be written as appropriate in order to demo improved functional BLE strength and balance.    Baseline not yet assessed.    Time 10    Period Weeks    Status New      PT LONG TERM GOAL #5   Title Pt will ambulate at least 62' with RW with single therapist and min guard/min A with no w/c follow in order to demo improved household mobility.    Baseline 115' with min guard-mod A of single therapist and w/c follow    Time 10    Period Weeks    Status New                 Plan - 06/09/20 1211    Clinical Impression Statement Pt able to incr gait distance today - able to perform 115' x3 reps. Pt needing min guard during 1st lap and more min A during 2nd and 3rd lap when pt fatigues and tendency to get R toe stuck and decr R foot clearance. Pt tolerated session well. Will continue to progress towards LTGs.    Personal Factors and  Comorbidities Comorbidity 3+;Past/Current Experience    Comorbidities T8 paraplegia, diabetes, HTN    Examination-Activity Limitations Locomotion Level;Transfers;Stand    Examination-Participation Restrictions Community Activity;Yard Work   Community education officer Evolving/Moderate complexity    Rehab Potential Good    PT Frequency 2x / week   1-2x   PT Duration 8 weeks   8 weeks   PT Treatment/Interventions Aquatic Therapy;Manual techniques;Therapeutic exercise;Gait training;Neuromuscular re-education;Orthotic Fit/Training    PT Next Visit Plan will need to ask for more visits from [place] - might do 1x week for 2 weeks for last 2 visits while we wait for auth. seated/supine strengthening. gait training with RW. sit <> stands, pre gait activities. SciFit, dynamic seated balance (compliant surface)    PT Home Exercise Plan Access Code: [email or web address]    Consulted and Agree with Plan of Care Patient           Patient will benefit from skilled therapeutic intervention in order to improve the following deficits and impairments:  Decreased balance,Decreased coordination,Decreased range of motion,Decreased strength,Decreased mobility,Postural dysfunction,Impaired tone,Decreased activity tolerance,Abnormal gait,Decreased endurance,Difficulty walking,Impaired sensation  Visit Diagnosis: Unsteadiness on feet  Difficulty in walking, not elsewhere classified  Muscle weakness (generalized)  Other symptoms and signs involving the nervous system     Problem  List Patient Active Problem List   Diagnosis Date Noted  . Erectile dysfunction due to diseases classified elsewhere 03/02/2020  . 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 Spasticity 08/03/2019  . Paraplegia ([place]) 07/07/2019  . Neurogenic bowel 07/07/2019  . Neurogenic bladder 07/07/2019  . Thoracic myelopathy 06/30/2019    Arliss Journey, PT, DPT 06/09/2020, 12:12 PM  [facility] [address] [place], [name], [address] Phone: [phone]   Fax:  [phone]  Name: ISAACK PREBLE MRN: [id number] Date of Birth: Nov 07, 1950

## 2020-06-14 ENCOUNTER — Encounter: Payer: Self-pay | Admitting: Physical Therapy

## 2020-06-14 ENCOUNTER — Ambulatory Visit: Payer: No Typology Code available for payment source | Admitting: Physical Therapy

## 2020-06-14 ENCOUNTER — Other Ambulatory Visit: Payer: Self-pay

## 2020-06-14 DIAGNOSIS — M6281 Muscle weakness (generalized): Secondary | ICD-10-CM

## 2020-06-14 DIAGNOSIS — R2681 Unsteadiness on feet: Secondary | ICD-10-CM

## 2020-06-14 DIAGNOSIS — R29818 Other symptoms and signs involving the nervous system: Secondary | ICD-10-CM

## 2020-06-14 DIAGNOSIS — R262 Difficulty in walking, not elsewhere classified: Secondary | ICD-10-CM

## 2020-06-14 NOTE — Therapy (Signed)
[facility] [address] [place], [name], [address] Phone: [phone]   Fax:  [phone]  Physical Therapy Treatment  Patient Details  Name: Timothy Davenport MRN: [id number] Date of Birth: [birth year] Referring Provider (PT): Juel Burrow    Encounter Date: 06/14/2020   PT End of Session - 06/14/20 1024    Visit Number 25    Number of Visits 30    Date for PT Re-Evaluation 07/17/20   60 day POC, 90 day cert   Authorization Type VA - 15 visits 02/05/20 - 06/04/20, 15 additional visits from 3/8-08/24/20    Authorization - Visit Number 10    Authorization - Number of Visits 15    Progress Note Due on Visit 20    PT Start Time 1016    PT Stop Time 1100    PT Time Calculation (min) 44 min    Equipment Utilized During Treatment Gait belt    Activity Tolerance Patient tolerated treatment well    Behavior During Therapy [facility] for tasks assessed/performed           Past Medical History:  Diagnosis Date  . Colon polyp   . Diabetes mellitus without complication ([place])   . Diabetic neuropathy ([place])   . HTN (hypertension)   . Patella fracture   . Renal calculi     Past Surgical History:  Procedure Laterality Date  . LUMBAR LAMINECTOMY/DECOMPRESSION MICRODISCECTOMY N/A 07/01/2019   Procedure: LUMBAR LAMINECTOMY/DECOMPRESSION MICRODISCECTOMY 1 LEVEL, THORACIC SIX-SEVEN;  Surgeon: Consuella Lose, MD;  Location: [place];  Service: Neurosurgery;  Laterality: N/A;  LUMBAR LAMINECTOMY/DECOMPRESSION MICRODISCECTOMY 1 LEVEL, THORACIC SIX-SEVEN   . ORIF PATELLA    . THORACIC DISCECTOMY N/A 07/03/2019   Procedure: Evacuation of Thoracic Hematoma;  Surgeon: Consuella Lose, MD;  Location: [place];  Service: Neurosurgery;  Laterality: N/A;    There were no vitals filed for this visit.   Subjective Assessment - 06/14/20 1024    Subjective No new complaints. No falls or pain to report.    Pertinent History T8 paraplegia,  diabetes, HTN    How long can you stand comfortably? probably a couple minutes with RW.    How long can you walk comfortably? approx. 5-10 minutes with his son and RW.    Patient Stated Goals wants to be able to walk without any assistance    Currently in Pain? No/denies    Pain Score 0-No pain                [facility] Adult PT Treatment/Exercise - 06/14/20 1027      Transfers   Transfers Stand to Sit;Sit to Stand    Sit to Stand 4: Min guard;From elevated surface;With upper extremity assist;From bed    Sit to Stand Details Verbal cues for technique;Verbal cues for precautions/safety;Verbal cues for safe use of DME/AE    Sit to Stand Details (indicate cue type and reason) reminder cues for one hand on mat, one on RW with no physical assist needed, increased time and effort needed.    Stand to Sit 4: Min guard;With upper extremity assist;To bed;To elevated surface    Stand to Sit Details (indicate cue type and reason) Verbal cues for sequencing;Verbal cues for technique;Verbal cues for safe use of DME/AE    Stand to Sit Details cues x2 reps to reach back and for controlled descent each time sitting back to mat.    Lateral/Scoot Transfers 6: Modified independent (Device/Increase time)    Lateral/Scoot Transfer Details (  indicate cue type and reason) W/C<>mat table      Ambulation/Gait   Ambulation/Gait Yes    Ambulation/Gait Assistance 4: Min guard;4: Min assist    Ambulation/Gait Assistance Details pt with left foot getting caught on right foot when trying to advance left foot in swing phase through out session with pt able to self correct each time. pt also noted to have increased LE spasms/tremors with gait that increased as reps progressed. wheelchair follow with final lap with wheelchair not needed. Pt able to perform 90*x1, or 180* x2  turn after each gait rep to sit onto mat table, including backward stepping as needed.    Ambulation Distance (Feet) 115 Feet   x 3 laps   Assistive  device Rolling walker    Gait Pattern Step-to pattern;Decreased weight shift to right;Left flexed knee in stance;Right flexed knee in stance;Narrow base of support;Decreased step length - right;Decreased step length - left;Decreased stance time - right;Decreased hip/knee flexion - right;Step-through pattern    Ambulation Surface Level;Indoor    Gait Comments discussed walking "for need" at home when son/caregiver present to assist- such as to the bathroom as needed, kitchen/dining room for meals, bedroom to living room for TV with wheelchair follow as needed.      Knee/Hip Exercises: Seated   Long Arc Quad AROM;Strengthening;Both;1 set;10 reps;Limitations    Long [employer] Limitations pt able to [employer] full range bil LE'swith 3 sec holds on left LE only    Hamstring Curl AROM;Strengthening;Both;1 set;10 reps;Limitations    Hamstring Limitations with foot on towel to allow it to slide on floor with red band resistance on left LE and yellow band resistance on right LE for 1 set of 10 reps each. cues with occaisonal assist for slow, controlled movements.                 PT Short Term Goals - 06/02/20 1022      PT SHORT TERM GOAL #1   Title Pt will perform stand pivot transfer with RW from w/c <> mat table with min A . ALL STGS DUE 05/30/20    Baseline 04/14/20: performed for 1st time today with min/mod assist needed with RW mat<>wheelchair.    Time 6   due to delay in scheduling   Period Weeks    Status New    Target Date 05/30/20      PT SHORT TERM GOAL #2   Title Pt will perform sit <> stand with min guard from standard height mat table surface in order to demo improved BLE strength and improved transfer ability.    Baseline min guard from lower mat table surfaces, therapist needing to steady RW due to pt having BUE support on it to stand    Status Achieved      PT SHORT TERM GOAL #3   Title Pt will ambulate at least 115' with RW with min guard/min A from single therapist and w/c  follow for safety.    Baseline met on 05/31/20, during 1st lap of gait    Status Achieved             PT Long Term Goals - 04/18/20 9417      PT LONG TERM GOAL #1   Title Pt and spouse will be independent with final HEP for BLE stretching and strengthening. ALL LTGS DUE 06/27/20    Baseline 04/14/20: met with current program, will benefit from updates as pt progresses    Time 10   due  to delay in scheduling   Period Weeks    Status On-going    Target Date 06/27/20      PT LONG TERM GOAL #2   Title Pt will tolerate static/dynamic standing activities with min guard in RW for at least 4 minutes in order to demo improved standing tolerance.    Baseline 04/14/20: pt able to stand just over 2 minutes at RW with min gaurd assist static/dynamic combined    Time 10    Period Weeks    Status On-going      PT LONG TERM GOAL #3   Title Pt will perform stand pivot transfer with RW from w/c <> mat table with min guard.    Baseline 04/14/20: performed for 1st time today with min/mod assist needed with RW mat<>wheelchair.    Time 10    Period Weeks    Status Revised      PT LONG TERM GOAL #4   Title 5x sit <> stand goal to be written as appropriate in order to demo improved functional BLE strength and balance.    Baseline not yet assessed.    Time 10    Period Weeks    Status New      PT LONG TERM GOAL #5   Title Pt will ambulate at least 8' with RW with single therapist and min guard/min A with no w/c follow in order to demo improved household mobility.    Baseline 115' with min guard-mod A of single therapist and w/c follow    Time 10    Period Weeks    Status New                 Plan - 06/14/20 1026    Clinical Impression Statement Today's skilled session continued to focus on gait with RW, transfer safety/training and LE strengthening with no significant issues noted or reported by patient. The pt is making steady progress toward goals and should benefit from continued PT to  progress toward unmet goals.    Personal Factors and Comorbidities Comorbidity 3+;Past/Current Experience    Comorbidities T8 paraplegia, diabetes, HTN    Examination-Activity Limitations Locomotion Level;Transfers;Stand    Examination-Participation Restrictions Community Activity;Yard Work   Community education officer Evolving/Moderate complexity    Rehab Potential Good    PT Frequency 2x / week   1-2x   PT Duration 8 weeks   8 weeks   PT Treatment/Interventions Aquatic Therapy;Manual techniques;Therapeutic exercise;Gait training;Neuromuscular re-education;Orthotic Fit/Training    PT Next Visit Plan . seated/supine strengthening. gait training with RW. sit <> stands. SciFit, dynamic seated balance (compliant surface)    PT Home Exercise Plan Access Code: [email or web address]    Consulted and Agree with Plan of Care Patient           Patient will benefit from skilled therapeutic intervention in order to improve the following deficits and impairments:  Decreased balance,Decreased coordination,Decreased range of motion,Decreased strength,Decreased mobility,Postural dysfunction,Impaired tone,Decreased activity tolerance,Abnormal gait,Decreased endurance,Difficulty walking,Impaired sensation  Visit Diagnosis: Unsteadiness on feet  Difficulty in walking, not elsewhere classified  Muscle weakness (generalized)  Other symptoms and signs involving the nervous system     Problem List Patient Active Problem List   Diagnosis Date Noted  . Erectile dysfunction due to diseases classified elsewhere 03/02/2020  . 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 Spasticity 08/03/2019  . Paraplegia ([place]) 07/07/2019  . Neurogenic bowel 07/07/2019  . Neurogenic bladder 07/07/2019  . Thoracic myelopathy 06/30/2019    Willow Ora, PTA,  [facility] [address] [place], [place] [address] [phone] 06/14/20, 1:11 PM   Name: Timothy Davenport MRN: [id number] Date  of Birth: 1950-02-22

## 2020-06-16 ENCOUNTER — Ambulatory Visit: Payer: No Typology Code available for payment source | Admitting: Physical Therapy

## 2020-06-16 ENCOUNTER — Encounter: Payer: Self-pay | Admitting: Physical Therapy

## 2020-06-16 ENCOUNTER — Other Ambulatory Visit: Payer: Self-pay

## 2020-06-16 DIAGNOSIS — M6281 Muscle weakness (generalized): Secondary | ICD-10-CM

## 2020-06-16 DIAGNOSIS — R29818 Other symptoms and signs involving the nervous system: Secondary | ICD-10-CM

## 2020-06-16 DIAGNOSIS — R293 Abnormal posture: Secondary | ICD-10-CM

## 2020-06-16 DIAGNOSIS — R2681 Unsteadiness on feet: Secondary | ICD-10-CM

## 2020-06-16 DIAGNOSIS — R262 Difficulty in walking, not elsewhere classified: Secondary | ICD-10-CM

## 2020-06-18 NOTE — Therapy (Signed)
[place] [facility] [address] [place], [name], [address] Phone: [phone]   Fax:  [phone]  Physical Therapy Treatment  Patient Details  Name: Timothy Davenport MRN: [id number] Date of Birth: 1950-11-14 Referring Provider (PT): Juel Burrow    Encounter Date: 06/16/2020     06/16/20 1107  PT Visits / Re-Eval  Visit Number 26  Number of Visits 30  Date for PT Re-Evaluation 07/17/20 (60 day POC, 90 day cert)  Authorization  Authorization Type VA - 15 visits 02/05/20 - 06/04/20, 15 additional visits from 3/8-08/24/20  Authorization - Visit Number 11  Authorization - Number of Visits 15  Progress Note Due on Visit 20  PT Time Calculation  PT Start Time 1102  PT Stop Time 1145  PT Time Calculation (min) 43 min  PT - End of Session  Equipment Utilized During Treatment Gait belt  Activity Tolerance Patient tolerated treatment well  Behavior During Therapy [facility] for tasks assessed/performed    Past Medical History:  Diagnosis Date  . Colon polyp   . Diabetes mellitus without complication ([place])   . Diabetic neuropathy ([place])   . HTN (hypertension)   . Patella fracture   . Renal calculi     Past Surgical History:  Procedure Laterality Date  . LUMBAR LAMINECTOMY/DECOMPRESSION MICRODISCECTOMY N/A 07/01/2019   Procedure: LUMBAR LAMINECTOMY/DECOMPRESSION MICRODISCECTOMY 1 LEVEL, THORACIC SIX-SEVEN;  Surgeon: Consuella Lose, MD;  Location: [place];  Service: Neurosurgery;  Laterality: N/A;  LUMBAR LAMINECTOMY/DECOMPRESSION MICRODISCECTOMY 1 LEVEL, THORACIC SIX-SEVEN   . ORIF PATELLA    . THORACIC DISCECTOMY N/A 07/03/2019   Procedure: Evacuation of Thoracic Hematoma;  Surgeon: Consuella Lose, MD;  Location: [facility];  Service: Neurosurgery;  Laterality: N/A;    There were no vitals filed for this visit.      06/16/20 1101  Symptoms/Limitations  Subjective No new complaints. No falls or pain to report. Had been  "walking for need" at home. Walked into kitchen and sat at [employer] table and has walked to bathroom with walker, sitting on elevated seat on toilet. Has also been walking out side as well.  Pertinent History T8 paraplegia, diabetes, HTN  How long can you stand comfortably? probably a couple minutes with RW.  How long can you walk comfortably? approx. 5-10 minutes with his son and RW.  Patient Stated Goals wants to be able to walk without any assistance  Pain Assessment  Currently in Pain? No/denies  Pain Score 0       06/16/20 1109  Transfers  Transfers Stand to Sit;Sit to Stand;Lateral/Scoot Transfers  Sit to Stand 4: Min guard;From elevated surface;With upper extremity assist;From bed  Sit to Stand Details Verbal cues for technique;Verbal cues for precautions/safety;Verbal cues for safe use of DME/AE  Stand to Sit 4: Min guard;With upper extremity assist;To bed;To elevated surface  Stand to Sit Details (indicate cue type and reason) Verbal cues for sequencing;Verbal cues for technique;Verbal cues for safe use of DME/AE  Lateral/Scoot Transfers 6: Modified independent (Device/Increase time)  Ambulation/Gait  Ambulation/Gait Yes  Ambulation/Gait Assistance 4: Min guard;4: Min assist  Ambulation/Gait Assistance Details no episodes of toe scuffing with right foot advancement. Pt taking longer steps as well with faster gait speed/time around noted this session. no wheelchair follow needed with any reps.  Ambulation Distance (Feet) 115 Feet (x3 reps)  Assistive device Rolling walker  Gait Pattern Step-to pattern;Decreased weight shift to right;Left flexed knee in stance;Right flexed knee in stance;Narrow base of support;Decreased step length -  right;Decreased step length - left;Decreased stance time - right;Decreased hip/knee flexion - right;Step-through pattern  Ambulation Surface Level;Indoor  Knee/Hip Exercises: Seated  Long Arc Quad AROM;Strengthening;Both;1 set;10 reps;Limitations   Long Arc Quad Limitations red band resistance on left, yellow band resistance on right with full range achieved. cues for slow and controlled movements.  Hamstring Curl AROM;Strengthening;Both;1 set;10 reps;Limitations  Hamstring Limitations with foot on towel to allow it to slide on floor with red band resistance on left LE and yellow band resistance on right LE for 1 set of 10 reps each. cues with occasional assist for slow, controlled movements.        PT Short Term Goals - 06/02/20 1022      PT SHORT TERM GOAL #1   Title Pt will perform stand pivot transfer with RW from w/c <> mat table with min A . ALL STGS DUE 05/30/20    Baseline 04/14/20: performed for 1st time today with min/mod assist needed with RW mat<>wheelchair.    Time 6   due to delay in scheduling   Period Weeks    Status New    Target Date 05/30/20      PT SHORT TERM GOAL #2   Title Pt will perform sit <> stand with min guard from standard height mat table surface in order to demo improved BLE strength and improved transfer ability.    Baseline min guard from lower mat table surfaces, therapist needing to steady RW due to pt having BUE support on it to stand    Status Achieved      PT SHORT TERM GOAL #3   Title Pt will ambulate at least 115' with RW with min guard/min A from single therapist and w/c follow for safety.    Baseline met on 05/31/20, during 1st lap of gait    Status Achieved             PT Long Term Goals - 04/18/20 4356      PT LONG TERM GOAL #1   Title Pt and spouse will be independent with final HEP for BLE stretching and strengthening. ALL LTGS DUE 06/27/20    Baseline 04/14/20: met with current program, will benefit from updates as pt progresses    Time 10   due to delay in scheduling   Period Weeks    Status On-going    Target Date 06/27/20      PT LONG TERM GOAL #2   Title Pt will tolerate static/dynamic standing activities with min guard in RW for at least 4 minutes in order to demo  improved standing tolerance.    Baseline 04/14/20: pt able to stand just over 2 minutes at RW with min gaurd assist static/dynamic combined    Time 10    Period Weeks    Status On-going      PT LONG TERM GOAL #3   Title Pt will perform stand pivot transfer with RW from w/c <> mat table with min guard.    Baseline 04/14/20: performed for 1st time today with min/mod assist needed with RW mat<>wheelchair.    Time 10    Period Weeks    Status Revised      PT LONG TERM GOAL #4   Title 5x sit <> stand goal to be written as appropriate in order to demo improved functional BLE strength and balance.    Baseline not yet assessed.    Time 10    Period Weeks    Status New  PT LONG TERM GOAL #5   Title Pt will ambulate at least 10' with RW with single therapist and min guard/min A with no w/c follow in order to demo improved household mobility.    Baseline 115' with min guard-mod A of single therapist and w/c follow    Time 10    Period Weeks    Status New             06/16/20 1107  Plan  Clinical Impression Statement Today's skilled session continued to focus on gait training with RW and LE strengthening. No scissoring with gait this session with increased gait speed noted with each rep. The pt is making steady progress toward goals and should benefit from continued PT to progress toward unmet goals.  Personal Factors and Comorbidities Comorbidity 3+;Past/Current Experience  Comorbidities T8 paraplegia, diabetes, HTN  Examination-Activity Limitations Locomotion Level;Transfers;Stand  Examination-Participation Restrictions Community Activity;Yard Work (gardening)  Pt will benefit from skilled therapeutic intervention in order to improve on the following deficits Decreased balance;Decreased coordination;Decreased range of motion;Decreased strength;Decreased mobility;Postural dysfunction;Impaired tone;Decreased activity tolerance;Abnormal gait;Decreased endurance;Difficulty  walking;Impaired sensation  Stability/Clinical Decision Making Evolving/Moderate complexity  Rehab Potential Good  PT Frequency 2x / week (1-2x)  PT Duration 8 weeks (8 weeks)  PT Treatment/Interventions Aquatic Therapy;Manual techniques;Therapeutic exercise;Gait training;Neuromuscular re-education;Orthotic Fit/Training  PT Next Visit Plan . seated/supine strengthening. gait training with RW. sit <> stands. SciFit, dynamic seated balance (compliant surface); need to submit referral for aquatic PT  PT Home Exercise Plan Access Code: [email or web address]  Consulted and Agree with Plan of Care Patient          Patient will benefit from skilled therapeutic intervention in order to improve the following deficits and impairments:  Decreased balance,Decreased coordination,Decreased range of motion,Decreased strength,Decreased mobility,Postural dysfunction,Impaired tone,Decreased activity tolerance,Abnormal gait,Decreased endurance,Difficulty walking,Impaired sensation  Visit Diagnosis: Unsteadiness on feet  Difficulty in walking, not elsewhere classified  Muscle weakness (generalized)  Other symptoms and signs involving the nervous system  Abnormal posture     Problem List Patient Active Problem List   Diagnosis Date Noted  . Erectile dysfunction due to diseases classified elsewhere 03/02/2020  . 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 Spasticity 08/03/2019  . Paraplegia ([place]) 07/07/2019  . Neurogenic bowel 07/07/2019  . Neurogenic bladder 07/07/2019  . Thoracic myelopathy 06/30/2019   Willow Ora, PTA, [facility] [address] [place], [place] [address] [phone] 06/18/20, 7:31 PM   Name: Timothy Davenport MRN: [id number] Date of Birth: 1950/05/28

## 2020-06-21 ENCOUNTER — Ambulatory Visit: Payer: No Typology Code available for payment source | Attending: Family Medicine | Admitting: Physical Therapy

## 2020-06-21 ENCOUNTER — Encounter: Payer: Self-pay | Admitting: Physical Therapy

## 2020-06-21 ENCOUNTER — Other Ambulatory Visit: Payer: Self-pay

## 2020-06-21 DIAGNOSIS — R262 Difficulty in walking, not elsewhere classified: Secondary | ICD-10-CM | POA: Diagnosis present

## 2020-06-21 DIAGNOSIS — R29818 Other symptoms and signs involving the nervous system: Secondary | ICD-10-CM | POA: Diagnosis present

## 2020-06-21 DIAGNOSIS — M6281 Muscle weakness (generalized): Secondary | ICD-10-CM | POA: Diagnosis present

## 2020-06-21 DIAGNOSIS — R293 Abnormal posture: Secondary | ICD-10-CM | POA: Diagnosis present

## 2020-06-21 DIAGNOSIS — R2681 Unsteadiness on feet: Secondary | ICD-10-CM | POA: Diagnosis not present

## 2020-06-21 NOTE — Therapy (Signed)
[facility] [address] [place], [name], [address] Phone: [phone]   Fax:  [phone]  Physical Therapy Treatment  Patient Details  Name: Timothy Davenport MRN: [id number] Date of Birth: 01/08/1951 Referring Provider (PT): Juel Burrow    Encounter Date: 06/21/2020   PT End of Session - 06/21/20 1021    Visit Number 27    Number of Visits 30    Date for PT Re-Evaluation 07/17/20   60 day POC, 90 day cert   Authorization Type VA - 15 visits 02/05/20 - 06/04/20, 15 additional visits from 3/8-08/24/20    Authorization - Visit Number 12    Authorization - Number of Visits 15    Progress Note Due on Visit 20    PT Start Time 1017    PT Stop Time 1100    PT Time Calculation (min) 43 min    Equipment Utilized During Treatment Gait belt    Activity Tolerance Patient tolerated treatment well    Behavior During Therapy WFL for tasks assessed/performed           Past Medical History:  Diagnosis Date  . Colon polyp   . Diabetes mellitus without complication ([place])   . Diabetic neuropathy ([place])   . HTN (hypertension)   . Patella fracture   . Renal calculi     Past Surgical History:  Procedure Laterality Date  . LUMBAR LAMINECTOMY/DECOMPRESSION MICRODISCECTOMY N/A 07/01/2019   Procedure: LUMBAR LAMINECTOMY/DECOMPRESSION MICRODISCECTOMY 1 LEVEL, THORACIC SIX-SEVEN;  Surgeon: Consuella Lose, MD;  Location: [facility];  Service: Neurosurgery;  Laterality: N/A;  LUMBAR LAMINECTOMY/DECOMPRESSION MICRODISCECTOMY 1 LEVEL, THORACIC SIX-SEVEN   . ORIF PATELLA    . THORACIC DISCECTOMY N/A 07/03/2019   Procedure: Evacuation of Thoracic Hematoma;  Surgeon: Consuella Lose, MD;  Location: [facility];  Service: Neurosurgery;  Laterality: N/A;    There were no vitals filed for this visit.   Subjective Assessment - 06/21/20 1020    Subjective No new complains. No falls or pain to report. Has continued to be walking at home with  family/nurse aide assist.    Pertinent History T8 paraplegia, diabetes, HTN    How long can you stand comfortably? probably a couple minutes with RW.    How long can you walk comfortably? approx. 5-10 minutes with his son and RW.    Patient Stated Goals wants to be able to walk without any assistance    Currently in Pain? No/denies    Pain Score 0-No pain                [facility] Adult PT Treatment/Exercise - 06/21/20 1021      Transfers   Transfers Stand to Sit;Sit to Stand;Lateral/Scoot Transfers    Sit to Stand 4: Min guard;From elevated surface;With upper extremity assist;From bed    Sit to Stand Details (indicate cue type and reason) no cues needed this session, min guard assist for safety only each time (x3 stands mat>RW)    Stand to Sit 4: Min guard;With upper extremity assist;To bed;To elevated surface    Stand to Sit Details min guard assist for safety only with pt demo'ing a good technique.    Lateral/Scoot Transfers 6: Modified independent (Device/Increase time)    Lateral/Scoot Transfer Details (indicate cue type and reason) W/C  <> mat table      Ambulation/Gait   Ambulation/Gait Yes    Ambulation/Gait Assistance 4: Min guard;4: Min assist    Ambulation/Gait Assistance Details pt continued to demonstrate improved  bil step length and improved right foot placement with swing phases. No right foot scuffing noted with 1st lap, 3-4 episodes with 2cd lap. Used simulated toe cap with 3rd lap with no issues noted. Pt will benefit from further use of simulated toe cap with gait in future sessions.    Ambulation Distance (Feet) 115 Feet   x3   Assistive device Rolling walker    Gait Pattern Step-to pattern;Decreased weight shift to right;Left flexed knee in stance;Right flexed knee in stance;Narrow base of support;Decreased step length - right;Decreased step length - left;Decreased stance time - right;Decreased hip/knee flexion - right;Step-through pattern    Ambulation Surface  Level;Indoor      Knee/Hip Exercises: Seated   Long Arc Quad AROM;Strengthening;Both;1 set;10 reps;Limitations    Long Arc Quad Limitations red band resistance on left, yellow band resistance on right with full range achieved. cues for slow and controlled movements.    Other Seated Knee/Hip Exercises with red band resistance for hip fall outs for 10 reps on each side. decreased range right>left side    Marching AROM;AAROM;Strengthening;Both;1 set;10 reps;Limitations    Marching Limitations red band resistance with min AA on right needed for increased hip flexion/controlled lowering back to floor    Hamstring Curl AROM;Strengthening;Both;1 set;10 reps;Limitations    Hamstring Limitations with foot on towel to allow it to slide on floor with red band resistance on left LE and yellow band resistance on right LE for 1 set of 10 reps each. cues with occasional assist for slow, controlled movements.                    PT Short Term Goals - 06/02/20 1022      PT SHORT TERM GOAL #1   Title Pt will perform stand pivot transfer with RW from w/c <> mat table with min A . ALL STGS DUE 05/30/20    Baseline 04/14/20: performed for 1st time today with min/mod assist needed with RW mat<>wheelchair.    Time 6   due to delay in scheduling   Period Weeks    Status New    Target Date 05/30/20      PT SHORT TERM GOAL #2   Title Pt will perform sit <> stand with min guard from standard height mat table surface in order to demo improved BLE strength and improved transfer ability.    Baseline min guard from lower mat table surfaces, therapist needing to steady RW due to pt having BUE support on it to stand    Status Achieved      PT SHORT TERM GOAL #3   Title Pt will ambulate at least 115' with RW with min guard/min A from single therapist and w/c follow for safety.    Baseline met on 05/31/20, during 1st lap of gait    Status Achieved             PT Long Term Goals - 04/18/20 6160      PT  LONG TERM GOAL #1   Title Pt and spouse will be independent with final HEP for BLE stretching and strengthening. ALL LTGS DUE 06/27/20    Baseline 04/14/20: met with current program, will benefit from updates as pt progresses    Time 10   due to delay in scheduling   Period Weeks    Status On-going    Target Date 06/27/20      PT LONG TERM GOAL #2   Title Pt will tolerate static/dynamic standing activities  with min guard in RW for at least 4 minutes in order to demo improved standing tolerance.    Baseline 04/14/20: pt able to stand just over 2 minutes at RW with min gaurd assist static/dynamic combined    Time 10    Period Weeks    Status On-going      PT LONG TERM GOAL #3   Title Pt will perform stand pivot transfer with RW from w/c <> mat table with min guard.    Baseline 04/14/20: performed for 1st time today with min/mod assist needed with RW mat<>wheelchair.    Time 10    Period Weeks    Status Revised      PT LONG TERM GOAL #4   Title 5x sit <> stand goal to be written as appropriate in order to demo improved functional BLE strength and balance.    Baseline not yet assessed.    Time 10    Period Weeks    Status New      PT LONG TERM GOAL #5   Title Pt will ambulate at least 44' with RW with single therapist and min guard/min A with no w/c follow in order to demo improved household mobility.    Baseline 115' with min guard-mod A of single therapist and w/c follow    Time 10    Period Weeks    Status New                 Plan - 06/21/20 1021    Clinical Impression Statement Today's skilled session continued to focus on gait with RW and LE strenghtening. Pt's gait continues to improve in technique each session. The pt is progressing well toward goals and should benefit from continued PT to progress toward unmet goals.    Personal Factors and Comorbidities Comorbidity 3+;Past/Current Experience    Comorbidities T8 paraplegia, diabetes, HTN    Examination-Activity  Limitations Locomotion Level;Transfers;Stand    Examination-Participation Restrictions Community Activity;Yard Work   Community education officer Evolving/Moderate complexity    Rehab Potential Good    PT Frequency 2x / week   1-2x   PT Duration 8 weeks   8 weeks   PT Treatment/Interventions Aquatic Therapy;Manual techniques;Therapeutic exercise;Gait training;Neuromuscular re-education;Orthotic Fit/Training    PT Next Visit Plan . seated/supine strengthening. gait training with RW. sit <> stands. SciFit, dynamic seated balance (compliant surface); need to submit referral for aquatic PT    PT Home Exercise Plan Access Code: [email or web address]    Consulted and Agree with Plan of Care Patient           Patient will benefit from skilled therapeutic intervention in order to improve the following deficits and impairments:  Decreased balance,Decreased coordination,Decreased range of motion,Decreased strength,Decreased mobility,Postural dysfunction,Impaired tone,Decreased activity tolerance,Abnormal gait,Decreased endurance,Difficulty walking,Impaired sensation  Visit Diagnosis: Unsteadiness on feet  Difficulty in walking, not elsewhere classified  Muscle weakness (generalized)  Other symptoms and signs involving the nervous system  Abnormal posture     Problem List Patient Active Problem List   Diagnosis Date Noted  . Erectile dysfunction due to diseases classified elsewhere 03/02/2020  . 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 Spasticity 08/03/2019  . Paraplegia ([place]) 07/07/2019  . Neurogenic bowel 07/07/2019  . Neurogenic bladder 07/07/2019  . 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

## 2020-06-23 ENCOUNTER — Ambulatory Visit: Payer: BC Managed Care – PPO | Admitting: Physical Therapy

## 2020-06-28 ENCOUNTER — Ambulatory Visit: Payer: No Typology Code available for payment source | Admitting: Physical Therapy

## 2020-06-28 ENCOUNTER — Other Ambulatory Visit: Payer: Self-pay

## 2020-06-28 ENCOUNTER — Encounter: Payer: Self-pay | Admitting: Physical Therapy

## 2020-06-28 DIAGNOSIS — R262 Difficulty in walking, not elsewhere classified: Secondary | ICD-10-CM

## 2020-06-28 DIAGNOSIS — R2681 Unsteadiness on feet: Secondary | ICD-10-CM | POA: Diagnosis not present

## 2020-06-28 DIAGNOSIS — R293 Abnormal posture: Secondary | ICD-10-CM

## 2020-06-28 DIAGNOSIS — R29818 Other symptoms and signs involving the nervous system: Secondary | ICD-10-CM

## 2020-06-28 DIAGNOSIS — M6281 Muscle weakness (generalized): Secondary | ICD-10-CM

## 2020-06-29 NOTE — Therapy (Signed)
[facility] [address] [place], [name], [address] Phone: [phone]   Fax:  [phone]  Physical Therapy Treatment  Patient Details  Name: Timothy Davenport MRN: [id number] Date of Birth: 03/19/1950 Referring Provider (PT): Juel Burrow    Encounter Date: 06/28/2020   PT End of Session - 06/28/20 1452    Visit Number 28    Number of Visits 30    Date for PT Re-Evaluation 07/17/20   60 day POC, 90 day cert   Authorization Type VA - 15 visits 02/05/20 - 06/04/20, 15 additional visits from 3/8-08/24/20    Authorization - Visit Number 13    Authorization - Number of Visits 15    Progress Note Due on Visit 20    PT Start Time 1448    PT Stop Time 1530    PT Time Calculation (min) 42 min    Equipment Utilized During Treatment Gait belt    Activity Tolerance Patient tolerated treatment well    Behavior During Therapy [facility] for tasks assessed/performed           Past Medical History:  Diagnosis Date  . Colon polyp   . Diabetes mellitus without complication ([place])   . Diabetic neuropathy ([place])   . HTN (hypertension)   . Patella fracture   . Renal calculi     Past Surgical History:  Procedure Laterality Date  . LUMBAR LAMINECTOMY/DECOMPRESSION MICRODISCECTOMY N/A 07/01/2019   Procedure: LUMBAR LAMINECTOMY/DECOMPRESSION MICRODISCECTOMY 1 LEVEL, THORACIC SIX-SEVEN;  Surgeon: Consuella Lose, MD;  Location: [facility];  Service: Neurosurgery;  Laterality: N/A;  LUMBAR LAMINECTOMY/DECOMPRESSION MICRODISCECTOMY 1 LEVEL, THORACIC SIX-SEVEN   . ORIF PATELLA    . THORACIC DISCECTOMY N/A 07/03/2019   Procedure: Evacuation of Thoracic Hematoma;  Surgeon: Consuella Lose, MD;  Location: [facility];  Service: Neurosurgery;  Laterality: N/A;    There were no vitals filed for this visit.   Subjective Assessment - 06/28/20 1451    Subjective No new complaitns. No falls or pain to report. Has been walking with family/nurse aide at  home.    Pertinent History T8 paraplegia, diabetes, HTN    How long can you stand comfortably? probably a couple minutes with RW.    How long can you walk comfortably? approx. 5-10 minutes with his son and RW.    Patient Stated Goals wants to be able to walk without any assistance    Currently in Pain? No/denies    Pain Score 0-No pain               [facility] Adult PT Treatment/Exercise - 06/28/20 1453      Transfers   Transfers Sit to Stand;Stand to Sit    Sit to Stand 4: Min guard;From elevated surface;With upper extremity assist;From bed    Sit to Stand Details (indicate cue type and reason) corner high low mat in lowest position with one hand on walker, one hand on mat table    Stand to Sit 4: Min guard;With upper extremity assist;To bed;To elevated surface    Stand to Sit Details demo's safe technique with controlled descent to mat table    Lateral/Scoot Transfers 6: Modified independent (Device/Increase time)    Lateral/Scoot Transfer Details (indicate cue type and reason) w/c>mat table      Ambulation/Gait   Ambulation/Gait Yes    Ambulation/Gait Assistance 4: Min guard;4: Min assist    Ambulation/Gait Assistance Details pt with increased episodes of scissoring this session however able to self correct each time.  Pt also with continued increased step length and increased gait speed with gait as well.    Ambulation Distance (Feet) 115 Feet   x3   Assistive device Rolling walker   right AFO   Gait Pattern Step-to pattern;Decreased weight shift to right;Left flexed knee in stance;Right flexed knee in stance;Narrow base of support;Decreased step length - right;Decreased step length - left;Decreased stance time - right;Decreased hip/knee flexion - right;Step-through pattern    Ambulation Surface Level;Indoor      Therapeutic Activites    Therapeutic Activities Other Therapeutic Activities    Other Therapeutic Activities standing at RW: alternating UE raises for 10 reps each side,  the hand clapping in front of pt with cues for slow, controlled movements for 10 reps. Ended with bil UE raises for 10 reps with cues for slow, controlled movements. cues with all for tall posture and weight shifting for balance.      Knee/Hip Exercises: Aerobic   Other Aerobic Scifit LE (seat 22)/UE's (#7) (occasionally just LE's) level 3.0 x 8 mintues with goal 80-90 steps per minute.                    PT Short Term Goals - 06/02/20 1022      PT SHORT TERM GOAL #1   Title Pt will perform stand pivot transfer with RW from w/c <> mat table with min A . ALL STGS DUE 05/30/20    Baseline 04/14/20: performed for 1st time today with min/mod assist needed with RW mat<>wheelchair.    Time 6   due to delay in scheduling   Period Weeks    Status New    Target Date 05/30/20      PT SHORT TERM GOAL #2   Title Pt will perform sit <> stand with min guard from standard height mat table surface in order to demo improved BLE strength and improved transfer ability.    Baseline min guard from lower mat table surfaces, therapist needing to steady RW due to pt having BUE support on it to stand    Status Achieved      PT SHORT TERM GOAL #3   Title Pt will ambulate at least 115' with RW with min guard/min A from single therapist and w/c follow for safety.    Baseline met on 05/31/20, during 1st lap of gait    Status Achieved             PT Long Term Goals - 04/18/20 1607      PT LONG TERM GOAL #1   Title Pt and spouse will be independent with final HEP for BLE stretching and strengthening. ALL LTGS DUE 06/27/20    Baseline 04/14/20: met with current program, will benefit from updates as pt progresses    Time 10   due to delay in scheduling   Period Weeks    Status On-going    Target Date 06/27/20      PT LONG TERM GOAL #2   Title Pt will tolerate static/dynamic standing activities with min guard in RW for at least 4 minutes in order to demo improved standing tolerance.    Baseline  04/14/20: pt able to stand just over 2 minutes at RW with min gaurd assist static/dynamic combined    Time 10    Period Weeks    Status On-going      PT LONG TERM GOAL #3   Title Pt will perform stand pivot transfer with RW from w/c <> mat  table with min guard.    Baseline 04/14/20: performed for 1st time today with min/mod assist needed with RW mat<>wheelchair.    Time 10    Period Weeks    Status Revised      PT LONG TERM GOAL #4   Title 5x sit <> stand goal to be written as appropriate in order to demo improved functional BLE strength and balance.    Baseline not yet assessed.    Time 10    Period Weeks    Status New      PT LONG TERM GOAL #5   Title Pt will ambulate at least 69' with RW with single therapist and min guard/min A with no w/c follow in order to demo improved household mobility.    Baseline 115' with min guard-mod A of single therapist and w/c follow    Time 10    Period Weeks    Status New                 Plan - 06/28/20 1452    Clinical Impression Statement Today's skilled seesion continued to focus on gait with RW, strengthening and began to work on standing balance with decreased UE support. No issues noted or reported in session. The pt is progressing toward goals and should benefit from continued PT to progress toward unmet goals.    Personal Factors and Comorbidities Comorbidity 3+;Past/Current Experience    Comorbidities T8 paraplegia, diabetes, HTN    Examination-Activity Limitations Locomotion Level;Transfers;Stand    Examination-Participation Restrictions Community Activity;Yard Work   Community education officer Evolving/Moderate complexity    Rehab Potential Good    PT Frequency 2x / week   1-2x   PT Duration 8 weeks   8 weeks   PT Treatment/Interventions Aquatic Therapy;Manual techniques;Therapeutic exercise;Gait training;Neuromuscular re-education;Orthotic Fit/Training    PT Next Visit Plan seated/supine strengthening.  gait training with RW. sit <> stands. SciFit, dynamic seated balance (compliant surface); need to submit referral for aquatic PT when more visits approved by [facility]    PT Home Exercise Plan Access Code: [email or web address]    Consulted and Agree with Plan of Care Patient           Patient will benefit from skilled therapeutic intervention in order to improve the following deficits and impairments:  Decreased balance,Decreased coordination,Decreased range of motion,Decreased strength,Decreased mobility,Postural dysfunction,Impaired tone,Decreased activity tolerance,Abnormal gait,Decreased endurance,Difficulty walking,Impaired sensation  Visit Diagnosis: Unsteadiness on feet  Difficulty in walking, not elsewhere classified  Muscle weakness (generalized)  Other symptoms and signs involving the nervous system  Abnormal posture     Problem List Patient Active Problem List   Diagnosis Date Noted  . Erectile dysfunction due to diseases classified elsewhere 03/02/2020  . 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 Spasticity 08/03/2019  . Paraplegia ([place]) 07/07/2019  . Neurogenic bowel 07/07/2019  . Neurogenic bladder 07/07/2019  . Thoracic myelopathy 06/30/2019   Willow Ora, PTA, [facility] Outpatient Neuro [facility] [address], [place], [place] [address] [phone] 06/29/20, 8:30 AM   Name: Timothy Davenport MRN: [id number] Date of Birth: 07-06-1950

## 2020-07-07 ENCOUNTER — Other Ambulatory Visit: Payer: Self-pay

## 2020-07-07 ENCOUNTER — Ambulatory Visit: Payer: No Typology Code available for payment source | Admitting: Physical Therapy

## 2020-07-07 ENCOUNTER — Encounter: Payer: Self-pay | Admitting: Physical Therapy

## 2020-07-07 DIAGNOSIS — R2681 Unsteadiness on feet: Secondary | ICD-10-CM | POA: Diagnosis not present

## 2020-07-07 DIAGNOSIS — R29818 Other symptoms and signs involving the nervous system: Secondary | ICD-10-CM

## 2020-07-07 DIAGNOSIS — M6281 Muscle weakness (generalized): Secondary | ICD-10-CM

## 2020-07-07 DIAGNOSIS — R262 Difficulty in walking, not elsewhere classified: Secondary | ICD-10-CM

## 2020-07-07 NOTE — Therapy (Signed)
[facility] [address] [place], [name], [address] Phone: [phone]   Fax:  [phone]  Physical Therapy Treatment  Patient Details  Name: Timothy Davenport MRN: [id number] Date of Birth: [birth year] Referring Provider (PT): Juel Burrow    Encounter Date: 07/07/2020   PT End of Session - 07/07/20 1058    Visit Number 29    Number of Visits 30    Date for PT Re-Evaluation 07/17/20   60 day POC, 90 day cert   Authorization Type VA - 15 visits 02/05/20 - 06/04/20, 15 additional visits from 3/8-08/24/20    Authorization - Visit Number 14    Authorization - Number of Visits 15    Progress Note Due on Visit 20    PT Start Time 1018    PT Stop Time 1057    PT Time Calculation (min) 39 min    Equipment Utilized During Treatment Gait belt    Activity Tolerance Patient tolerated treatment well    Behavior During Therapy WFL for tasks assessed/performed           Past Medical History:  Diagnosis Date  . Colon polyp   . Diabetes mellitus without complication ([place])   . Diabetic neuropathy ([place])   . HTN (hypertension)   . Patella fracture   . Renal calculi     Past Surgical History:  Procedure Laterality Date  . LUMBAR LAMINECTOMY/DECOMPRESSION MICRODISCECTOMY N/A 07/01/2019   Procedure: LUMBAR LAMINECTOMY/DECOMPRESSION MICRODISCECTOMY 1 LEVEL, THORACIC SIX-SEVEN;  Surgeon: Consuella Lose, MD;  Location: [facility];  Service: Neurosurgery;  Laterality: N/A;  LUMBAR LAMINECTOMY/DECOMPRESSION MICRODISCECTOMY 1 LEVEL, THORACIC SIX-SEVEN   . ORIF PATELLA    . THORACIC DISCECTOMY N/A 07/03/2019   Procedure: Evacuation of Thoracic Hematoma;  Surgeon: Consuella Lose, MD;  Location: [facility];  Service: Neurosurgery;  Laterality: N/A;    There were no vitals filed for this visit.   Subjective Assessment - 07/07/20 1022    Subjective Has been walking at home and doing exercises. No other changes.    Pertinent History T8  paraplegia, diabetes, HTN    How long can you stand comfortably? probably a couple minutes with RW.    How long can you walk comfortably? approx. 5-10 minutes with his son and RW.    Patient Stated Goals wants to be able to walk without any assistance    Currently in Pain? No/denies                             [facility] Adult PT Treatment/Exercise - 07/07/20 1031      Transfers   Transfers Sit to Stand;Stand to Sit    Sit to Stand 4: Min guard;From elevated surface;With upper extremity assist;From bed    Sit to Stand Details (indicate cue type and reason) from slightly elevated mat table, one hand on RW and one from mat table, and performed from w/c with RW with pt needing min A due to performing from a lower surface    Five time sit to stand comments  37.03 seconds from mat table slightly elevated, therapist needing to help steady RW    Stand to Sit 4: Min guard;With upper extremity assist;To bed;To elevated surface    Stand to Sit Details proper technique and controlled descent, pt fully backing up to mat table    Stand Pivot Transfers 4: Min guard    Stand Pivot Transfer Details (indicate cue type and reason) from  w/c > mat table    Lateral/Scoot Transfers 6: Modified independent (Device/Increase time)    Lateral/Scoot Transfer Details (indicate cue type and reason) from mat table > w/c      Ambulation/Gait   Ambulation/Gait Yes    Ambulation/Gait Assistance 4: Min guard;4: Min assist    Ambulation/Gait Assistance Details pt able to maintain step through pattern with bouts of gait and improved gait speed. pt did have 2 episodes of catching RLE behind L, but pt able to self correct. pt reached out to [employer] about getting a toe cap added to R shoe in order to help decr scuffing    Ambulation Distance (Feet) 115 Feet    Assistive device Rolling walker    Gait Pattern Decreased weight shift to right;Left flexed knee in stance;Right flexed knee in stance;Narrow base of  support;Decreased step length - right;Decreased step length - left;Decreased stance time - right;Decreased hip/knee flexion - right;Step-through pattern    Ambulation Surface Level;Indoor      Exercises   Exercises Other Exercises    Other Exercises  2 x 10 reps clamshells B - needing AAROM with RLE and for LLE during 2 set, therapist helping to get pt in proper position and prevent hips from rolling back                  PT Education - 07/07/20 1057    Education Details discussed goals going forwards (pt wants to work on improving walking distance, learn how to get on/off floor from his w/c, try to get to rollator) and aquatic therapy - therapist to submit in referral for aquatics, progress towards goals.    Person(s) Educated Patient    Methods Explanation    Comprehension Verbalized understanding            PT Short Term Goals - 06/02/20 1022      PT SHORT TERM GOAL #1   Title Pt will perform stand pivot transfer with RW from w/c <> mat table with min A . ALL STGS DUE 05/30/20    Baseline 04/14/20: performed for 1st time today with min/mod assist needed with RW mat<>wheelchair.    Time 6   due to delay in scheduling   Period Weeks    Status New    Target Date 05/30/20      PT SHORT TERM GOAL #2   Title Pt will perform sit <> stand with min guard from standard height mat table surface in order to demo improved BLE strength and improved transfer ability.    Baseline min guard from lower mat table surfaces, therapist needing to steady RW due to pt having BUE support on it to stand    Status Achieved      PT SHORT TERM GOAL #3   Title Pt will ambulate at least 115' with RW with min guard/min A from single therapist and w/c follow for safety.    Baseline met on 05/31/20, during 1st lap of gait    Status Achieved             PT Long Term Goals - 07/07/20 1041      PT LONG TERM GOAL #1   Title Pt and spouse will be independent with final HEP for BLE stretching and  strengthening. ALL LTGS DUE 06/27/20    Baseline 04/14/20: met with current program, will benefit from updates as pt progresses    Time 10   due to delay in scheduling   Period Weeks  Status On-going      PT LONG TERM GOAL #2   Title Pt will tolerate static/dynamic standing activities with min guard in RW for at least 4 minutes in order to demo improved standing tolerance.    Baseline 04/14/20: pt able to stand just over 2 minutes at RW with min gaurd assist static/dynamic combined    Time 10    Period Weeks    Status On-going      PT LONG TERM GOAL #3   Title Pt will perform stand pivot transfer with RW from w/c <> mat table with min guard.    Baseline met with min guard on 07/07/20    Time 10    Period Weeks    Status Achieved      PT LONG TERM GOAL #4   Title 5x sit <> stand goal to be written as appropriate in order to demo improved functional BLE strength and balance.    Baseline 37.03 seconds from mat table slightly elevated, therapist needing to help steady RW    Time 10    Period Weeks    Status Achieved      PT LONG TERM GOAL #5   Title Pt will ambulate at least 64' with RW with single therapist and min guard/min A with no w/c follow in order to demo improved household mobility.    Baseline can ambulate 115' x3 bouts with min guard and then min A when pt fatigues more    Time 10    Period Weeks    Status Achieved                Plan - 07/07/20 1104    Clinical Impression Statement Began to assess pt's LTGs today. Pt has met 3 out of 5 LTGs. Pt able to perform stand pivot transfers with min guard. Pt able to perform 5x sit <> stand in 37.03 seconds from slightly elevated mat table - therapist helping to steady RW when pt comes to stand. Pt can consistently ambulate 3 laps of 115' with RW and beginning with min guard and when more tired does need min A. Pt is making consistent progress. Pt has received additional auth for visits from the [place]. Will assess remaining LTGs  at next session and perform re-cert.    Personal Factors and Comorbidities Comorbidity 3+;Past/Current Experience    Comorbidities T8 paraplegia, diabetes, HTN    Examination-Activity Limitations Locomotion Level;Transfers;Stand    Examination-Participation Restrictions Community Activity;Yard Work   Community education officer Evolving/Moderate complexity    Rehab Potential Good    PT Frequency 2x / week   1-2x   PT Duration 8 weeks   8 weeks   PT Treatment/Interventions Aquatic Therapy;Manual techniques;Therapeutic exercise;Gait training;Neuromuscular re-education;Orthotic Fit/Training    PT Next Visit Plan check goals and re-cert. any word from aquatic? seated/supine strengthening. gait training with RW. sit <> stands. SciFit, dynamic seated balance (compliant surface)    PT Home Exercise Plan Access Code: [email or web address]    Consulted and Agree with Plan of Care Patient           Patient will benefit from skilled therapeutic intervention in order to improve the following deficits and impairments:  Decreased balance,Decreased coordination,Decreased range of motion,Decreased strength,Decreased mobility,Postural dysfunction,Impaired tone,Decreased activity tolerance,Abnormal gait,Decreased endurance,Difficulty walking,Impaired sensation  Visit Diagnosis: Unsteadiness on feet  Difficulty in walking, not elsewhere classified  Muscle weakness (generalized)  Other symptoms and signs involving the nervous system     Problem List Patient  Active Problem List   Diagnosis Date Noted  . Erectile dysfunction due to diseases classified elsewhere 03/02/2020  . 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 Spasticity 08/03/2019  . Paraplegia ([place]) 07/07/2019  . Neurogenic bowel 07/07/2019  . Neurogenic bladder 07/07/2019  . 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

## 2020-07-14 ENCOUNTER — Encounter: Payer: Self-pay | Admitting: Physical Therapy

## 2020-07-14 ENCOUNTER — Ambulatory Visit: Payer: No Typology Code available for payment source | Admitting: Physical Therapy

## 2020-07-14 ENCOUNTER — Other Ambulatory Visit: Payer: Self-pay

## 2020-07-14 DIAGNOSIS — R2681 Unsteadiness on feet: Secondary | ICD-10-CM

## 2020-07-14 DIAGNOSIS — R29818 Other symptoms and signs involving the nervous system: Secondary | ICD-10-CM

## 2020-07-14 DIAGNOSIS — R293 Abnormal posture: Secondary | ICD-10-CM

## 2020-07-14 DIAGNOSIS — R262 Difficulty in walking, not elsewhere classified: Secondary | ICD-10-CM

## 2020-07-14 DIAGNOSIS — M6281 Muscle weakness (generalized): Secondary | ICD-10-CM

## 2020-07-14 NOTE — Patient Instructions (Signed)
Access Code: [email or web address] URL: [email or web address] Date: 07/14/2020 Prepared by: Willow Ora  Program Notes buttock raise: off of hospital bed, having your son/wife there, pressing through arms and legs lifting your bottom off the bed, holding for ~2 seconds and then sitting back down. 2 x 5 reps, 1 -2x per day    Exercises Modified Thomas Stretch - 1 x daily - 5 x weekly - 1 sets - 10 reps Seated Long Arc Quad - 1 x daily - 5 x weekly - 1 sets - 10 reps Seated Single Leg Hip Abduction with Resistance - 1 x daily - 5 x weekly - 1 sets - 10 reps Seated Hamstring Curl with Anchored Resistance - 1 x daily - 5 x weekly - 1 sets - 10 reps Standing March with Counter Support - 1 x daily - 5 x weekly - 1 sets - 10 reps Wide Stance with Counter Support - 1 x daily - 5 x weekly - 1 sets - 10 reps

## 2020-07-18 NOTE — Therapy (Signed)
[facility] [address] [place], [name], [address] Phone: [phone]   Fax:  [phone]  Physical Therapy Treatment  Patient Details  Name: MARDELL CRAGG MRN: [id number] Date of Birth: 08-04-1950 Referring Provider (PT): Juel Burrow    Encounter Date: 07/14/2020   07/14/20 1027  PT Visits / Re-Eval  Visit Number 30  Number of Visits 30  Date for PT Re-Evaluation 07/17/20 (60 day POC, 90 day cert)  Authorization  Authorization Type VA - 15 visits 02/05/20 - 06/04/20, 15 additional visits from 3/8-08/24/20  Authorization - Visit Number 15  Authorization - Number of Visits 15  Progress Note Due on Visit 30  PT Time Calculation  PT Start Time 1018  PT Stop Time 1100  PT Time Calculation (min) 42 min  PT - End of Session  Equipment Utilized During Treatment Gait belt  Activity Tolerance Patient tolerated treatment well  Behavior During Therapy [facility] for tasks assessed/performed     Past Medical History:  Diagnosis Date  . Colon polyp   . Diabetes mellitus without complication ([place])   . Diabetic neuropathy ([place])   . HTN (hypertension)   . Patella fracture   . Renal calculi     Past Surgical History:  Procedure Laterality Date  . LUMBAR LAMINECTOMY/DECOMPRESSION MICRODISCECTOMY N/A 07/01/2019   Procedure: LUMBAR LAMINECTOMY/DECOMPRESSION MICRODISCECTOMY 1 LEVEL, THORACIC SIX-SEVEN;  Surgeon: Consuella Lose, MD;  Location: [facility];  Service: Neurosurgery;  Laterality: N/A;  LUMBAR LAMINECTOMY/DECOMPRESSION MICRODISCECTOMY 1 LEVEL, THORACIC SIX-SEVEN   . ORIF PATELLA    . THORACIC DISCECTOMY N/A 07/03/2019   Procedure: Evacuation of Thoracic Hematoma;  Surgeon: Consuella Lose, MD;  Location: [facility];  Service: Neurosurgery;  Laterality: N/A;    There were no vitals filed for this visit.     07/14/20 1023  Symptoms/Limitations  Subjective No new complaints. Went to [place] [facility] in [place], [place] for follow  up. No changes made except he got a new power chair. Just got back last night. They are looking at Botox for bladder control assitance, waiting on test results to come back in. He would most likely have this done a a local [facility] center.  Pertinent History T8 paraplegia, diabetes, HTN  How long can you stand comfortably? probably a couple minutes with RW.  How long can you walk comfortably? approx. 5-10 minutes with his son and RW.  Patient Stated Goals wants to be able to walk without any assistance  Pain Assessment  Currently in Pain? No/denies  Pain Score 0      07/14/20 1029  Transfers  Transfers Sit to Stand;Stand to Sit;Squat Pivot Transfers  Sit to Stand 4: Min guard;From elevated surface;With upper extremity assist;From bed  Stand to Sit 4: Min guard;With upper extremity assist;To bed;To elevated surface  Squat Pivot Transfers 5: Supervision  Squat Pivot Transfer Details (indicate cue type and reason) w/c <> mat table  Comments standing at RW next to mat table: 4 minutes with min guard assist to supervision- static standing, marching in place, lateral weight shifting and no UE support for last ~5-6 seconds.  Exercises  Exercises Other Exercises  Other Exercises  reviewed and advanced HEP. Refer to [place] for full details.    Issued the following to HEP:   Access Code: [email or web address] URL: [email or web address] Date: 07/14/2020 Prepared by: Willow Ora  Program Notes buttock raise: off of hospital bed, having your son/wife there, pressing through arms and legs lifting your bottom off the bed, holding for ~2 seconds  and then sitting back down. 2 x 5 reps, 1 -2x per day    Exercises Modified Thomas Stretch - 1 x daily - 5 x weekly - 1 sets - 10 reps Seated Long Arc Quad - 1 x daily - 5 x weekly - 1 sets - 10 reps Seated Single Leg Hip Abduction with Resistance - 1 x daily - 5 x weekly - 1 sets - 10 reps Seated Hamstring Curl with Anchored Resistance - 1 x daily - 5 x  weekly - 1 sets - 10 reps Standing March with Counter Support - 1 x daily - 5 x weekly - 1 sets - 10 reps Wide Stance with Counter Support - 1 x daily - 5 x weekly - 1 sets - 10 reps     07/14/20 1630  PT Education  Education Details progress toward remaining LTGs. updated HEP  Person(s) Educated Patient  Methods Explanation;Demonstration;Verbal cues;Handout  Comprehension Verbalized understanding;Returned demonstration;Verbal cues required;Need further instruction        PT Short Term Goals - 06/02/20 1022      PT SHORT TERM GOAL #1   Title Pt will perform stand pivot transfer with RW from w/c <> mat table with min A . ALL STGS DUE 05/30/20    Baseline 04/14/20: performed for 1st time today with min/mod assist needed with RW mat<>wheelchair.    Time 6   due to delay in scheduling   Period Weeks    Status New    Target Date 05/30/20      PT SHORT TERM GOAL #2   Title Pt will perform sit <> stand with min guard from standard height mat table surface in order to demo improved BLE strength and improved transfer ability.    Baseline min guard from lower mat table surfaces, therapist needing to steady RW due to pt having BUE support on it to stand    Status Achieved      PT SHORT TERM GOAL #3   Title Pt will ambulate at least 115' with RW with min guard/min A from single therapist and w/c follow for safety.    Baseline met on 05/31/20, during 1st lap of gait    Status Achieved             PT Long Term Goals - 07/14/20 1028      PT LONG TERM GOAL #1   Title Pt and spouse will be independent with final HEP for BLE stretching and strengthening. ALL LTGS DUE 06/27/20    Baseline 04/14/20: met with current program, will benefit from updates as pt progresses    Time 10   due to delay in scheduling   Period Weeks    Status On-going      PT LONG TERM GOAL #2   Title Pt will tolerate static/dynamic standing activities with min guard in RW for at least 4 minutes in order to demo  improved standing tolerance.    Baseline 04/14/20: pt able to stand just over 2 minutes at RW with min gaurd assist static/dynamic combined    Time 10    Period Weeks    Status On-going      PT LONG TERM GOAL #3   Title Pt will perform stand pivot transfer with RW from w/c <> mat table with min guard.    Baseline met with min guard on 07/07/20    Status Achieved      PT LONG TERM GOAL #4   Title 5x sit <> stand  goal to be written as appropriate in order to demo improved functional BLE strength and balance.    Baseline 37.03 seconds from mat table slightly elevated, therapist needing to help steady RW    Status Achieved      PT LONG TERM GOAL #5   Title Pt will ambulate at least 115' with RW with single therapist and min guard/min A with no w/c follow in order to demo improved household mobility.    Baseline can ambulate 115' x3 bouts with min guard and then min A when pt fatigues more    Status Achieved             07/14/20 1028  Plan  Clinical Impression Statement Today's skilled session focused on progress toward remaining LTGs with both goals met. Pt able to stand at [facility] with supervision for 4 minutes and updated HEP this session. Primary PT plans to recert. The pt is making stead progress and should benefit from continued PT to progress mobility.  Personal Factors and Comorbidities Comorbidity 3+;Past/Current Experience  Comorbidities T8 paraplegia, diabetes, HTN  Examination-Activity Limitations Locomotion Level;Transfers;Stand  Examination-Participation Restrictions Community Activity;Yard Work (gardening)  Pt will benefit from skilled therapeutic intervention in order to improve on the following deficits Decreased balance;Decreased coordination;Decreased range of motion;Decreased strength;Decreased mobility;Postural dysfunction;Impaired tone;Decreased activity tolerance;Abnormal gait;Decreased endurance;Difficulty walking;Impaired sensation  Stability/Clinical Decision Making  Evolving/Moderate complexity  Rehab Potential Good  PT Frequency 2x / week (1-2x)  PT Duration 8 weeks (8 weeks)  PT Treatment/Interventions Aquatic Therapy;Manual techniques;Therapeutic exercise;Gait training;Neuromuscular re-education;Orthotic Fit/Training  PT Next Visit Plan any word from aquatic- [place] to reach out to pt about scheduling.  seated/supine strengthening. gait training with RW. sit <> stands. SciFit, dynamic seated balance (compliant surface)  PT Home Exercise Plan Access Code: [email or web address]  Consulted and Agree with Plan of Care Patient          Patient will benefit from skilled therapeutic intervention in order to improve the following deficits and impairments:  Decreased balance,Decreased coordination,Decreased range of motion,Decreased strength,Decreased mobility,Postural dysfunction,Impaired tone,Decreased activity tolerance,Abnormal gait,Decreased endurance,Difficulty walking,Impaired sensation  Visit Diagnosis: Unsteadiness on feet  Difficulty in walking, not elsewhere classified  Muscle weakness (generalized)  Other symptoms and signs involving the nervous system  Abnormal posture     Problem List Patient Active Problem List   Diagnosis Date Noted  . Erectile dysfunction due to diseases classified elsewhere 03/02/2020  . 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 Spasticity 08/03/2019  . Paraplegia ([place]) 07/07/2019  . Neurogenic bowel 07/07/2019  . Neurogenic bladder 07/07/2019  . Thoracic myelopathy 06/30/2019    Willow Ora, PTA, [facility] [address], [place], [place] [address] [phone] 07/18/20, 10:07 AM   Name: KANEN MOTTOLA MRN: [id number] Date of Birth: May 02, 1950

## 2020-07-19 ENCOUNTER — Other Ambulatory Visit: Payer: Self-pay

## 2020-07-19 ENCOUNTER — Encounter: Payer: Self-pay | Admitting: Physical Therapy

## 2020-07-19 ENCOUNTER — Ambulatory Visit: Payer: No Typology Code available for payment source | Admitting: Physical Therapy

## 2020-07-19 DIAGNOSIS — R2681 Unsteadiness on feet: Secondary | ICD-10-CM | POA: Diagnosis not present

## 2020-07-19 DIAGNOSIS — R29818 Other symptoms and signs involving the nervous system: Secondary | ICD-10-CM

## 2020-07-19 DIAGNOSIS — R262 Difficulty in walking, not elsewhere classified: Secondary | ICD-10-CM

## 2020-07-19 DIAGNOSIS — M6281 Muscle weakness (generalized): Secondary | ICD-10-CM

## 2020-07-19 DIAGNOSIS — R293 Abnormal posture: Secondary | ICD-10-CM

## 2020-07-19 NOTE — Therapy (Addendum)
[facility] [address] [place], [name], [address] Phone: [phone]   Fax:  [phone]  Physical Therapy Treatment/Re-Cert  Patient Details  Name: Timothy Davenport MRN: [id number] Date of Birth: 01/07/1951 Referring Provider (PT): Juel Burrow    Encounter Date: 07/19/2020   PT End of Session - 07/19/20 1022    Visit Number 31    Number of Visits 46    Date for PT Re-Evaluation 10/18/20   60 DAY POC, 58 DAY CERT, 15 visits approved through 10/22/20   Authorization Type VA - 15 visits 02/05/20 - 06/04/20, 15 additional visits from 3/8-08/24/20, 15 additional visits through 10/22/20    Authorization - Visit Number 1    Authorization - Number of Visits 15    Progress Note Due on Visit 73    PT Start Time 1018    PT Stop Time 1100    PT Time Calculation (min) 42 min    Equipment Utilized During Treatment Gait belt    Activity Tolerance Patient tolerated treatment well    Behavior During Therapy WFL for tasks assessed/performed           Past Medical History:  Diagnosis Date  . Colon polyp   . Diabetes mellitus without complication ([place])   . Diabetic neuropathy ([place])   . HTN (hypertension)   . Patella fracture   . Renal calculi     Past Surgical History:  Procedure Laterality Date  . LUMBAR LAMINECTOMY/DECOMPRESSION MICRODISCECTOMY N/A 07/01/2019   Procedure: LUMBAR LAMINECTOMY/DECOMPRESSION MICRODISCECTOMY 1 LEVEL, THORACIC SIX-SEVEN;  Surgeon: Consuella Lose, MD;  Location: [facility];  Service: Neurosurgery;  Laterality: N/A;  LUMBAR LAMINECTOMY/DECOMPRESSION MICRODISCECTOMY 1 LEVEL, THORACIC SIX-SEVEN   . ORIF PATELLA    . THORACIC DISCECTOMY N/A 07/03/2019   Procedure: Evacuation of Thoracic Hematoma;  Surgeon: Consuella Lose, MD;  Location: [facility];  Service: Neurosurgery;  Laterality: N/A;    There were no vitals filed for this visit.   Subjective Assessment - 07/19/20 1022    Subjective No new  complaints. No falls or pain to report.    Pertinent History T8 paraplegia, diabetes, HTN    How long can you stand comfortably? probably a couple minutes with RW.    How long can you walk comfortably? approx. 5-10 minutes with his son and RW.    Patient Stated Goals wants to be able to walk without any assistance    Currently in Pain? No/denies    Pain Score 0-No pain                  [facility] Adult PT Treatment/Exercise - 07/19/20 1024      Transfers   Transfers Sit to Stand;Stand to Sit;Lateral/Scoot Transfers    Sit to Stand 4: Min guard;From elevated surface;With upper extremity assist;From bed    Sit to Stand Details (indicate cue type and reason) from elevated mat to RW with no assistance needed, one hand on mat and other one on RW    Stand to Sit 4: Min guard;With upper extremity assist;To bed;To elevated surface    Stand to Sit Details no assistance needed with pt reaching back with right hand and sitting with controlled descent    Lateral/Scoot Transfers 6: Modified independent (Device/Increase time)    Lateral/Scoot Transfer Details (indicate cue type and reason) wheelchair to mat table      Ambulation/Gait   Ambulation/Gait Yes    Ambulation/Gait Assistance 4: Min guard;4: Min assist    Ambulation/Gait Assistance Details  pt with increased left scissoring with gait/swing phase as he fatigues. Pt is able to self correct this when it occurs with increased time and effort.    Ambulation Distance (Feet) 230 Feet   x1, 115 x1   Assistive device Rolling walker    Gait Pattern Decreased weight shift to right;Left flexed knee in stance;Right flexed knee in stance;Narrow base of support;Decreased step length - right;Decreased step length - left;Decreased stance time - right;Decreased hip/knee flexion - right;Step-through pattern    Ambulation Surface Level;Indoor      Exercises   Exercises Other Exercises      Knee/Hip Exercises: Seated   Long Arc Quad  AROM;Strengthening;Both;1 set;10 reps;Limitations    Long Arc Quad Limitations red band resistance to bil LE's with pt moving through full range, increased time/effort needed with right LE.    Hamstring Curl AROM;Strengthening;Both;10 reps;Limitations;2 sets    Hamstring Limitations with foot on towel and red band resistance for bil LE, assist to stabilize foot when pt was sliding foot back                    PT Short Term Goals - 06/02/20 1022      PT SHORT TERM GOAL #1   Title Pt will perform stand pivot transfer with RW from w/c <> mat table with min A . ALL STGS DUE 05/30/20    Baseline 04/14/20: performed for 1st time today with min/mod assist needed with RW mat<>wheelchair.    Time 6   due to delay in scheduling   Period Weeks    Status New    Target Date 05/30/20      PT SHORT TERM GOAL #2   Title Pt will perform sit <> stand with min guard from standard height mat table surface in order to demo improved BLE strength and improved transfer ability.    Baseline min guard from lower mat table surfaces, therapist needing to steady RW due to pt having BUE support on it to stand    Status Achieved      PT SHORT TERM GOAL #3   Title Pt will ambulate at least 115' with RW with min guard/min A from single therapist and w/c follow for safety.    Baseline met on 05/31/20, during 1st lap of gait    Status Achieved          Revised STGs for re-cert:   PT Short Term Goals - 07/20/20 1609      PT SHORT TERM GOAL #1   Title Pt will perform stand pivot transfer with RW from w/c <> mat table with supervision to demo improved functional transfers. ALL STGS DUE 08/17/20    Baseline currently needs min guard.    Time 4   due to delay in scheduling   Period Weeks    Status New    Target Date 08/17/20      PT SHORT TERM GOAL #2   Title Pt will undergo assessment of gait speed with LTG to be written as appropriate.    Baseline not yet assessed.    Time 4    Period Weeks    Status  New      PT SHORT TERM GOAL #3   Title Pt will ambulate at least 230' consistently with RW and min guard throughout in order to demo improved household mobility.    Baseline needs min A when more fatigued.    Time 4    Period Weeks  Status New      PT SHORT TERM GOAL #4   Title Pt will decr 5x sit <> Stand time to 33 seconds or less with UE support in order to demo improved functional transfers    Baseline 37.03 seconds from mat table slightly elevated, therapist needing to help steady RW    Time 4    Period Weeks    Status New      PT SHORT TERM GOAL #5   Title Pt will initiate aquatic therapy.    Baseline has not yet started.    Time 4    Period Weeks    Status New             PT Long Term Goals - 07/14/20 1028      PT LONG TERM GOAL #1   Title Pt and spouse will be independent with final HEP for BLE stretching and strengthening. ALL LTGS DUE 06/27/20    Baseline 07/14/20: met with current program. advanced HEP this session.    Time --   due to delay in scheduling   Period --    Status Achieved      PT LONG TERM GOAL #2   Title Pt will tolerate static/dynamic standing activities with min guard in RW for at least 4 minutes in order to demo improved standing tolerance.    Baseline 07/14/20: met in session today    Time --    Period --    Status Achieved      PT LONG TERM GOAL #3   Title Pt will perform stand pivot transfer with RW from w/c <> mat table with min guard.    Baseline met with min guard on 07/07/20    Status Achieved      PT LONG TERM GOAL #4   Title 5x sit <> stand goal to be written as appropriate in order to demo improved functional BLE strength and balance.    Baseline 37.03 seconds from mat table slightly elevated, therapist needing to help steady RW    Status Achieved      PT LONG TERM GOAL #5   Title Pt will ambulate at least 115' with RW with single therapist and min guard/min A with no w/c follow in order to demo improved household mobility.     Baseline can ambulate 115' x3 bouts with min guard and then min A when pt fatigues more    Status Achieved           Revised LTGs for recert:      PT Long Term Goals - 07/20/20 1612      PT LONG TERM GOAL #1   Title Pt and spouse will be independent with final HEP for BLE stretching and strengthening. ALL LTGS DUE 09/14/20    Baseline pt will continue to benefit from ongoing additions    Time 8   due to delay in scheduling   Period Weeks    Status On-going    Target Date 09/14/20      PT LONG TERM GOAL #2   Title Gait speed with RW goal to be written as appropriate.    Baseline not yet assessed.    Time 8    Period Weeks    Status New      PT LONG TERM GOAL #3   Title Pt will decr 5x sit <> Stand time to 27 seconds or less with UE support in order to demo improved functional transfers    Baseline  needing min guard/min A    Time 8    Period Weeks    Status New      PT LONG TERM GOAL #4   Title Pt will decr 5x sit <> Stand time to 28 seconds or less with UE support in order to demo improved functional transfers    Baseline 37.03 seconds from mat table slightly elevated, therapist needing to help steady RW    Time 8    Period Weeks    Status New      PT LONG TERM GOAL #5   Title Pt will ambulate at least 345'' over level surfaces with RW and min guard/min A in order to demo improved household mobility and improved endurance    Baseline 230' max distance in a single bout with min guard/min A    Time 8    Period Weeks    Status New              Plan - 07/19/20 1023    Clinical Impression Statement Today's skilled session continued to focus on gait and strengthening. The pt was able to increase his consecutive gait distance today by double before needing a seated rest break. The pt is making steady progress toward goals and should benefit from continued PT to progress toward unmet goals. From primary PT:Re-cert performed on visit on 07/19/20. LTGs assessed on 07/07/20  and 07/14/20. Pt has met all 5 of his LTGs.  Pt able to ambulate 3 bouts of 115' with min guard initially and more min A when pt fatigues. Pt able to stand for at least 4 minutes at RW with min guard performing dynamic/static standing. Pt has made excellent progress towards his LTGs.  Will re-cert for an additional 1-2x a week for 7-8 weeks in order to continue to progress strengthening, gait, balance, endurance, functional transfers in order to demo improved functional mobility and decr risk of falls. STGs and LTGs updated as appropriate.    Personal Factors and Comorbidities Comorbidity 3+;Past/Current Experience    Comorbidities T8 paraplegia, diabetes, HTN    Examination-Activity Limitations Locomotion Level;Transfers;Stand    Examination-Participation Restrictions Community Activity;Yard Work   Community education officer Evolving/Moderate complexity    Rehab Potential Good    PT Frequency 2x / week   1-2x   PT Duration 8 weeks   8 weeks   PT Treatment/Interventions Aquatic Therapy;Manual techniques;Therapeutic exercise;Gait training;Neuromuscular re-education;Orthotic Fit/Training    PT Next Visit Plan seated/supine strengthening. gait training with RW. sit <> stands. SciFit, dynamic seated balance (compliant surface)    PT Home Exercise Plan Access Code: [email or web address]    Consulted and Agree with Plan of Care Patient           Patient will benefit from skilled therapeutic intervention in order to improve the following deficits and impairments:  Decreased balance,Decreased coordination,Decreased range of motion,Decreased strength,Decreased mobility,Postural dysfunction,Impaired tone,Decreased activity tolerance,Abnormal gait,Decreased endurance,Difficulty walking,Impaired sensation  Visit Diagnosis: Unsteadiness on feet  Difficulty in walking, not elsewhere classified  Muscle weakness (generalized)  Other symptoms and signs involving the nervous system  Abnormal  posture     Problem List Patient Active Problem List   Diagnosis Date Noted  . Erectile dysfunction due to diseases classified elsewhere 03/02/2020  . 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 Spasticity 08/03/2019  . Paraplegia ([facility]) 07/07/2019  . Neurogenic bowel 07/07/2019  . Neurogenic bladder 07/07/2019  . Thoracic myelopathy 06/30/2019    Willow Ora, PTA, [facility] [phone] [facility]  [address], [place], [place] [address] [phone] 07/20/20, 4:08 PM   Name: Timothy Davenport MRN: [id number] Date of Birth: 03/12/1950   Janann August, PT, DPT 07/20/20 4:08 PM

## 2020-07-20 NOTE — Addendum Note (Signed)
Addended by: Arliss Journey on: 07/20/2020 04:16 PM   Modules accepted: Orders

## 2020-07-21 ENCOUNTER — Encounter: Payer: Self-pay | Admitting: Physical Therapy

## 2020-07-21 ENCOUNTER — Ambulatory Visit: Payer: No Typology Code available for payment source | Attending: Family Medicine | Admitting: Physical Therapy

## 2020-07-21 ENCOUNTER — Other Ambulatory Visit: Payer: Self-pay

## 2020-07-21 DIAGNOSIS — M6281 Muscle weakness (generalized): Secondary | ICD-10-CM | POA: Diagnosis present

## 2020-07-21 DIAGNOSIS — R29818 Other symptoms and signs involving the nervous system: Secondary | ICD-10-CM | POA: Diagnosis present

## 2020-07-21 DIAGNOSIS — R262 Difficulty in walking, not elsewhere classified: Secondary | ICD-10-CM | POA: Diagnosis present

## 2020-07-21 DIAGNOSIS — R2681 Unsteadiness on feet: Secondary | ICD-10-CM | POA: Diagnosis present

## 2020-07-21 DIAGNOSIS — R293 Abnormal posture: Secondary | ICD-10-CM | POA: Insufficient documentation

## 2020-07-21 NOTE — Therapy (Signed)
[facility] [address] [place], [name], [address] Phone: [phone]   Fax:  [phone]  Physical Therapy Treatment  Patient Details  Name: Timothy Davenport MRN: [id number] Date of Birth: [birth year] Referring Provider (PT): Juel Burrow    Encounter Date: 07/21/2020   PT End of Session - 07/21/20 1202    Visit Number 32    Number of Visits 46    Date for PT Re-Evaluation 10/18/20   60 DAY POC, 29 DAY CERT, 15 visits approved through 10/22/20   Authorization Type VA - 15 visits 02/05/20 - 06/04/20, 15 additional visits from 3/8-08/24/20, 15 additional visits through 10/22/20    Authorization - Visit Number 2    Authorization - Number of Visits 15    Progress Note Due on Visit 40    PT Start Time 1104    PT Stop Time 1145    PT Time Calculation (min) 41 min    Equipment Utilized During Treatment Gait belt    Activity Tolerance Patient tolerated treatment well    Behavior During Therapy WFL for tasks assessed/performed           Past Medical History:  Diagnosis Date  . Colon polyp   . Diabetes mellitus without complication ([facility])   . Diabetic neuropathy ([facility])   . HTN (hypertension)   . Patella fracture   . Renal calculi     Past Surgical History:  Procedure Laterality Date  . LUMBAR LAMINECTOMY/DECOMPRESSION MICRODISCECTOMY N/A 07/01/2019   Procedure: LUMBAR LAMINECTOMY/DECOMPRESSION MICRODISCECTOMY 1 LEVEL, THORACIC SIX-SEVEN;  Surgeon: Consuella Lose, MD;  Location: [facility];  Service: Neurosurgery;  Laterality: N/A;  LUMBAR LAMINECTOMY/DECOMPRESSION MICRODISCECTOMY 1 LEVEL, THORACIC SIX-SEVEN   . ORIF PATELLA    . THORACIC DISCECTOMY N/A 07/03/2019   Procedure: Evacuation of Thoracic Hematoma;  Surgeon: Consuella Lose, MD;  Location: [facility];  Service: Neurosurgery;  Laterality: N/A;    There were no vitals filed for this visit.   Subjective Assessment - 07/21/20 1106    Subjective No new complaints.  Nothing new.    Pertinent History T8 paraplegia, diabetes, HTN    How long can you stand comfortably? probably a couple minutes with RW.    How long can you walk comfortably? approx. 5-10 minutes with his son and RW.    Patient Stated Goals wants to be able to walk without any assistance    Currently in Pain? No/denies                             [facility] Adult PT Treatment/Exercise - 07/21/20 1106      Transfers   Transfers Sit to Stand;Stand to Sit;Lateral/Scoot Transfers    Sit to Stand 4: Min guard;From elevated surface;With upper extremity assist;From bed    Sit to Stand Details (indicate cue type and reason) from slightly elevated mat table to RW, one hand on RW and other on mat table, performed x5 reps    Stand to Sit 4: Min guard;With upper extremity assist;To bed;To elevated surface    Lateral/Scoot Transfers 6: Modified independent (Device/Increase time)    Lateral/Scoot Transfer Details (indicate cue type and reason) wheelchair to mat table      Ambulation/Gait   Ambulation/Gait Yes    Ambulation/Gait Assistance 4: Min guard    Ambulation/Gait Assistance Details continued with incr distance today with patient able to maintain gait speed, had one episode of narrow BOS/scissoring with pt able to correct  on his home    Ambulation Distance (Feet) 230 Feet   x1   Assistive device Rolling walker    Gait Pattern Decreased weight shift to right;Left flexed knee in stance;Right flexed knee in stance;Narrow base of support;Decreased step length - right;Decreased step length - left;Decreased stance time - right;Decreased hip/knee flexion - right;Step-through pattern    Ambulation Surface Level;Indoor      Neuro Re-ed    Neuro Re-ed Details  NMR: on mat table; quadraped - alternating UE lifts x6 reps B - tactile cues to keep hips neutrals, closed chain hip ABD x10 reps B - needing assist to bring RLE out and in, min guard as needed for balance and cues for elbow extension      Exercises   Exercises Other Exercises    Other Exercises  standing at RW: x10 reps mini squats - verbal and demo cues for proper technique and cues for posture/glute activation in standing, pt performing a couple reps with no UE support with min guard for balance      Knee/Hip Exercises: Supine   Bridges AROM;Strengthening;Limitations;Both;1 set;10 reps    Bridges Limitations with red tband around thighs - cues for hip ABD first before lifting into bridge    Other Supine Knee/Hip Exercises marching x10 reps B with AAROM with RLE, x10 reps with LLE                    PT Short Term Goals - 07/20/20 1609      PT SHORT TERM GOAL #1   Title Pt will perform stand pivot transfer with RW from w/c <> mat table with supervision to demo improved functional transfers. ALL STGS DUE 08/17/20    Baseline currently needs min guard.    Time 4   due to delay in scheduling   Period Weeks    Status New    Target Date 08/17/20      PT SHORT TERM GOAL #2   Title Pt will undergo assessment of gait speed with LTG to be written as appropriate.    Baseline not yet assessed.    Time 4    Period Weeks    Status New      PT SHORT TERM GOAL #3   Title Pt will ambulate at least 230' consistently with RW and min guard throughout in order to demo improved household mobility.    Baseline needs min A when more fatigued.    Time 4    Period Weeks    Status New      PT SHORT TERM GOAL #4   Title Pt will decr 5x sit <> Stand time to 33 seconds or less with UE support in order to demo improved functional transfers    Baseline 37.03 seconds from mat table slightly elevated, therapist needing to help steady RW    Time 4    Period Weeks    Status New      PT SHORT TERM GOAL #5   Title Pt will initiate aquatic therapy.    Baseline has not yet started.    Time 4    Period Weeks    Status New             PT Long Term Goals - 07/20/20 1612      PT LONG TERM GOAL #1   Title Pt and spouse  will be independent with final HEP for BLE stretching and strengthening. ALL LTGS DUE 09/14/20    Baseline pt  will continue to benefit from ongoing additions    Time 8   due to delay in scheduling   Period Weeks    Status On-going    Target Date 09/14/20      PT LONG TERM GOAL #2   Title Gait speed with RW goal to be written as appropriate.    Baseline not yet assessed.    Time 8    Period Weeks    Status New      PT LONG TERM GOAL #3   Title Pt will decr 5x sit <> Stand time to 27 seconds or less with UE support in order to demo improved functional transfers    Baseline needing min guard/min A    Time 8    Period Weeks    Status New      PT LONG TERM GOAL #4   Title Pt will decr 5x sit <> Stand time to 28 seconds or less with UE support in order to demo improved functional transfers    Baseline 37.03 seconds from mat table slightly elevated, therapist needing to help steady RW    Time 8    Period Weeks    Status New      PT LONG TERM GOAL #5   Title Pt will ambulate at least 345'' over level surfaces with RW and min guard/min A in order to demo improved household mobility and improved endurance    Baseline 230' max distance in a single bout with min guard/min A    Time 8    Period Weeks    Status New                 Plan - 07/21/20 1204    Clinical Impression Statement Pt able to ambulate 230' today consecutively again with min guard and RW. Performed activities in quadraped today for incr core activation and hip strengthening, pt fatiguing easily in this position and needing a laying down rest break between activities. Pt is making excellent progress, will continue to progress towards LTGs.    Personal Factors and Comorbidities Comorbidity 3+;Past/Current Experience    Comorbidities T8 paraplegia, diabetes, HTN    Examination-Activity Limitations Locomotion Level;Transfers;Stand    Examination-Participation Restrictions Community Activity;Yard Work   Systems analyst Evolving/Moderate complexity    Rehab Potential Good    PT Frequency 2x / week   1-2x   PT Duration 8 weeks   8 weeks   PT Treatment/Interventions Aquatic Therapy;Manual techniques;Therapeutic exercise;Gait training;Neuromuscular re-education;Orthotic Fit/Training    PT Next Visit Plan any news on aquatics? continue seated/supine strengthening. gait training with RW and incr distance. sit <> stands. SciFit, dynamic seated balance (compliant surface)    PT Home Exercise Plan Access Code: [email or web address]    Consulted and Agree with Plan of Care Patient           Patient will benefit from skilled therapeutic intervention in order to improve the following deficits and impairments:  Decreased balance,Decreased coordination,Decreased range of motion,Decreased strength,Decreased mobility,Postural dysfunction,Impaired tone,Decreased activity tolerance,Abnormal gait,Decreased endurance,Difficulty walking,Impaired sensation  Visit Diagnosis: Difficulty in walking, not elsewhere classified  Unsteadiness on feet  Muscle weakness (generalized)     Problem List Patient Active Problem List   Diagnosis Date Noted  . Erectile dysfunction due to diseases classified elsewhere 03/02/2020  . 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 Spasticity 08/03/2019  . Paraplegia ([place]) 07/07/2019  . Neurogenic bowel 07/07/2019  . Neurogenic bladder 07/07/2019  . Thoracic myelopathy 06/30/2019  Arliss Journey, PT, DPT  07/21/2020, 12:07 PM  [facility] [address] [place], [name], [address] Phone: [phone]   Fax:  [phone]  Name: Timothy Davenport MRN: [id number] Date of Birth: [birth year]

## 2020-07-25 ENCOUNTER — Other Ambulatory Visit: Payer: Self-pay

## 2020-07-25 ENCOUNTER — Encounter: Payer: Self-pay | Admitting: Physical Therapy

## 2020-07-25 ENCOUNTER — Ambulatory Visit: Payer: No Typology Code available for payment source | Attending: Family Medicine | Admitting: Physical Therapy

## 2020-07-25 DIAGNOSIS — R262 Difficulty in walking, not elsewhere classified: Secondary | ICD-10-CM | POA: Diagnosis not present

## 2020-07-25 DIAGNOSIS — R29818 Other symptoms and signs involving the nervous system: Secondary | ICD-10-CM | POA: Insufficient documentation

## 2020-07-25 DIAGNOSIS — R2681 Unsteadiness on feet: Secondary | ICD-10-CM | POA: Insufficient documentation

## 2020-07-25 DIAGNOSIS — M6281 Muscle weakness (generalized): Secondary | ICD-10-CM | POA: Insufficient documentation

## 2020-07-25 NOTE — Therapy (Signed)
[facility] [address] [place], [name], [address] Phone: [phone]   Fax:  [phone]  Physical Therapy Treatment  Patient Details  Name: Timothy Davenport MRN: [id number] Date of Birth: February 13, 1951 Referring Provider (PT): Juel Burrow    Encounter Date: 07/25/2020   PT End of Session - 07/25/20 1615    Visit Number 33    Number of Visits 46    Date for PT Re-Evaluation 10/18/20   60 DAY POC, 83 DAY CERT, 15 visits approved through 10/22/20   Authorization Type VA - 15 visits 02/05/20 - 06/04/20, 15 additional visits from 3/8-08/24/20, 15 additional visits through 10/22/20    Authorization - Visit Number 3    Authorization - Number of Visits 15    Progress Note Due on Visit 54    PT Start Time 1230    PT Stop Time 1315    PT Time Calculation (min) 45 min    Equipment Utilized During Treatment Other (comment)   floatation belt, water walker, noodle, neck noodle   Activity Tolerance Patient tolerated treatment well    Behavior During Therapy [facility] for tasks assessed/performed           Past Medical History:  Diagnosis Date  . Colon polyp   . Diabetes mellitus without complication ([place])   . Diabetic neuropathy ([place])   . HTN (hypertension)   . Patella fracture   . Renal calculi     Past Surgical History:  Procedure Laterality Date  . LUMBAR LAMINECTOMY/DECOMPRESSION MICRODISCECTOMY N/A 07/01/2019   Procedure: LUMBAR LAMINECTOMY/DECOMPRESSION MICRODISCECTOMY 1 LEVEL, THORACIC SIX-SEVEN;  Surgeon: Consuella Lose, MD;  Location: [facility] [facility];  Service: Neurosurgery;  Laterality: N/A;  LUMBAR LAMINECTOMY/DECOMPRESSION MICRODISCECTOMY 1 LEVEL, THORACIC SIX-SEVEN   . ORIF PATELLA    . THORACIC DISCECTOMY N/A 07/03/2019   Procedure: Evacuation of Thoracic Hematoma;  Surgeon: Consuella Lose, MD;  Location: [facility];  Service: Neurosurgery;  Laterality: N/A;    There were no vitals filed for this visit.   Subjective  Assessment - 07/25/20 1613    Subjective Denies any changes.  Arrives for first aquatic session with wife present.    Pertinent History T8 paraplegia, diabetes, HTN    How long can you stand comfortably? probably a couple minutes with RW.    How long can you walk comfortably? approx. 5-10 minutes with his son and RW.    Patient Stated Goals wants to be able to walk without any assistance    Currently in Pain? No/denies           Aquatic therapy at [facility] - water temp. 89 degrees  Patient seen for aquatic therapy today.  Treatment took place in water 3.6-4.5 feet deep depending upon activity.   Pt entered the pool via hydraulic chair lift; pt was transferred from wheelchair to/from chair lift with min assist via scoot.  Pt standing at pool edge with waist floatation belt and holding edge with bil UE assist.  Performed bil LE squats x 10.  Standing holding water walker pushing down into water with UE's into extension x 10 reps with min assist to balance.  Pt performed gait training with use of water walker for bil. UE support - pt held walker stabilized by PTA and min-mod assist.  Pt gait trained approx. 18' x 8 reps.  Pt seated on pool bench and performed bil LE LAQ x 10 reps and hip flexion x 10 reps.  Pt required AAROM for RLE.  PTA performed PROM of R LE into knee flexion to address tone in R quad.  Needed assist at times to maintain seated position on bench due to buoyancy.   Placed neck noodle, waist floatation and pool noodle on pt then assisted pt into supine position.  Performed hip abd/add x 15 x 2 sets.  Performed bil bicycling LE's with difficulty with RLE due to tone.  PTA then assisted pt with RLE hip/knee flexion for >20 reps.  Pt in same position and PTA placed bil feet against wall, pushed pt into hip/knee flexion then pt pushing into extension to propel self off wall.  Performed > 8 reps.   Pt requires buoyancy of water for support and for joint  offloading; standing activities are much safer in water with reduced fall risk compared to same activities performed on land.  Viscosity of water needed for resistance for strengthening bil. LE's.  Bouyancy supported exercises also assist with spasticity reduction.       PT Short Term Goals - 07/20/20 1609      PT SHORT TERM GOAL #1   Title Pt will perform stand pivot transfer with RW from w/c <> mat table with supervision to demo improved functional transfers. ALL STGS DUE 08/17/20    Baseline currently needs min guard.    Time 4   due to delay in scheduling   Period Weeks    Status New    Target Date 08/17/20      PT SHORT TERM GOAL #2   Title Pt will undergo assessment of gait speed with LTG to be written as appropriate.    Baseline not yet assessed.    Time 4    Period Weeks    Status New      PT SHORT TERM GOAL #3   Title Pt will ambulate at least 230' consistently with RW and min guard throughout in order to demo improved household mobility.    Baseline needs min A when more fatigued.    Time 4    Period Weeks    Status New      PT SHORT TERM GOAL #4   Title Pt will decr 5x sit <> Stand time to 33 seconds or less with UE support in order to demo improved functional transfers    Baseline 37.03 seconds from mat table slightly elevated, therapist needing to help steady RW    Time 4    Period Weeks    Status New      PT SHORT TERM GOAL #5   Title Pt will initiate aquatic therapy.    Baseline has not yet started.    Time 4    Period Weeks    Status New             PT Long Term Goals - 07/20/20 1612      PT LONG TERM GOAL #1   Title Pt and spouse will be independent with final HEP for BLE stretching and strengthening. ALL LTGS DUE 09/14/20    Baseline pt will continue to benefit from ongoing additions    Time 8   due to delay in scheduling   Period Weeks    Status On-going    Target Date 09/14/20      PT LONG TERM GOAL #2   Title Gait speed with RW goal to  be written as appropriate.    Baseline not yet assessed.    Time 8    Period Weeks    Status New  PT LONG TERM GOAL #3   Title Pt will decr 5x sit <> Stand time to 27 seconds or less with UE support in order to demo improved functional transfers    Baseline needing min guard/min A    Time 8    Period Weeks    Status New      PT LONG TERM GOAL #4   Title Pt will decr 5x sit <> Stand time to 28 seconds or less with UE support in order to demo improved functional transfers    Baseline 37.03 seconds from mat table slightly elevated, therapist needing to help steady RW    Time 8    Period Weeks    Status New      PT LONG TERM GOAL #5   Title Pt will ambulate at least 345'' over level surfaces with RW and min guard/min A in order to demo improved household mobility and improved endurance    Baseline 230' max distance in a single bout with min guard/min A    Time 8    Period Weeks    Status New                 Plan - 07/25/20 1616    Clinical Impression Statement Pt tolerated first aquatic session well today without need for rest breaks.  RLE tone present and impacts mobility.  Pt requiring bil UE support with all activities and min-mod assist of PTA.  Cont per poc.    Personal Factors and Comorbidities Comorbidity 3+;Past/Current Experience    Comorbidities T8 paraplegia, diabetes, HTN    Examination-Activity Limitations Locomotion Level;Transfers;Stand    Examination-Participation Restrictions Community Activity;Yard Work   Community education officer Evolving/Moderate complexity    Rehab Potential Good    PT Frequency 2x / week   1-2x   PT Duration 8 weeks   8 weeks   PT Treatment/Interventions Aquatic Therapy;Manual techniques;Therapeutic exercise;Gait training;Neuromuscular re-education;Orthotic Fit/Training    PT Next Visit Plan continue seated/supine strengthening. gait training with RW and incr distance. sit <> stands. SciFit, dynamic seated  balance (compliant surface)    PT Home Exercise Plan Access Code: [email or web address]    Consulted and Agree with Plan of Care Patient;Family member/caregiver    Family Member Consulted [place]           Patient will benefit from skilled therapeutic intervention in order to improve the following deficits and impairments:  Decreased balance,Decreased coordination,Decreased range of motion,Decreased strength,Decreased mobility,Postural dysfunction,Impaired tone,Decreased activity tolerance,Abnormal gait,Decreased endurance,Difficulty walking,Impaired sensation  Visit Diagnosis: Difficulty in walking, not elsewhere classified  Unsteadiness on feet  Muscle weakness (generalized)     Problem List Patient Active Problem List   Diagnosis Date Noted  . Erectile dysfunction due to diseases classified elsewhere 03/02/2020  . 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 Spasticity 08/03/2019  . Paraplegia ([place]) 07/07/2019  . Neurogenic bowel 07/07/2019  . Neurogenic bladder 07/07/2019  . 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 [place], [name], [address] Phone: [phone]   Fax:  [phone]  Name: MUREL SHENBERGER MRN: [id number] Date of Birth: 02-13-1951

## 2020-07-26 ENCOUNTER — Ambulatory Visit: Payer: No Typology Code available for payment source | Admitting: Physical Therapy

## 2020-07-28 ENCOUNTER — Other Ambulatory Visit: Payer: Self-pay

## 2020-07-28 ENCOUNTER — Ambulatory Visit: Payer: No Typology Code available for payment source | Admitting: Physical Therapy

## 2020-07-28 ENCOUNTER — Encounter: Payer: Self-pay | Admitting: Physical Therapy

## 2020-07-28 DIAGNOSIS — M6281 Muscle weakness (generalized): Secondary | ICD-10-CM

## 2020-07-28 DIAGNOSIS — R2681 Unsteadiness on feet: Secondary | ICD-10-CM

## 2020-07-28 DIAGNOSIS — R262 Difficulty in walking, not elsewhere classified: Secondary | ICD-10-CM | POA: Diagnosis not present

## 2020-07-28 NOTE — Therapy (Signed)
[facility] [address] [place], [name], [address] Phone: [phone]   Fax:  [phone]  Physical Therapy Treatment  Patient Details  Name: Timothy Davenport MRN: [id number] Date of Birth: January 21, 1951 Referring Provider (PT): Juel Burrow    Encounter Date: 07/28/2020   PT End of Session - 07/28/20 1152     Visit Number 34    Number of Visits 60    Date for PT Re-Evaluation 10/18/20   60 DAY POC, 30 DAY CERT, 15 visits approved through 10/22/20   Authorization Type VA - 15 visits 02/05/20 - 06/04/20, 15 additional visits from 3/8-08/24/20, 15 additional visits through 10/22/20    Authorization - Visit Number 4    Authorization - Number of Visits 15    Progress Note Due on Visit 55    PT Start Time 1103    PT Stop Time 1144    PT Time Calculation (min) 41 min    Equipment Utilized During Treatment Gait belt    Activity Tolerance Patient tolerated treatment well    Behavior During Therapy WFL for tasks assessed/performed             Past Medical History:  Diagnosis Date   Colon polyp    Diabetes mellitus without complication ([place])    Diabetic neuropathy ([place])    HTN (hypertension)    Patella fracture    Renal calculi     Past Surgical History:  Procedure Laterality Date   LUMBAR LAMINECTOMY/DECOMPRESSION MICRODISCECTOMY N/A 07/01/2019   Procedure: LUMBAR LAMINECTOMY/DECOMPRESSION MICRODISCECTOMY 1 LEVEL, THORACIC SIX-SEVEN;  Surgeon: Consuella Lose, MD;  Location: [facility];  Service: Neurosurgery;  Laterality: N/A;  LUMBAR LAMINECTOMY/DECOMPRESSION MICRODISCECTOMY 1 LEVEL, THORACIC SIX-SEVEN    ORIF PATELLA     THORACIC DISCECTOMY N/A 07/03/2019   Procedure: Evacuation of Thoracic Hematoma;  Surgeon: Consuella Lose, MD;  Location: [facility];  Service: Neurosurgery;  Laterality: N/A;    There were no vitals filed for this visit.   Subjective Assessment - 07/28/20 1104     Subjective Loved being in the  pool.    Pertinent History T8 paraplegia, diabetes, HTN    How long can you stand comfortably? probably a couple minutes with RW.    How long can you walk comfortably? approx. 5-10 minutes with his son and RW.    Patient Stated Goals wants to be able to walk without any assistance    Currently in Pain? No/denies                               [facility] Adult PT Treatment/Exercise - 07/28/20 1117       Transfers   Transfers Sit to Stand;Stand to Sit;Lateral/Scoot Transfers    Sit to Stand 4: Min guard;From elevated surface;With upper extremity assist;From bed    Sit to Stand Details (indicate cue type and reason) from slightly elevated mat table, x3 reps prior to gait, therapist just helping to hold RW to steady    Stand to Sit 4: Min guard;With upper extremity assist;To bed;To elevated surface    Lateral/Scoot Transfers 6: Modified independent (Device/Increase time)    Lateral/Scoot Transfer Details (indicate cue type and reason) w/c to mat table      Ambulation/Gait   Ambulation/Gait Yes    Ambulation/Gait Assistance 4: Min guard;4: Min assist    Ambulation/Gait Assistance Details pt with one instance of getting R foot caught behind L, but pt able to  correct on his own. during 2nd bout of gait, pt taking too narrow of a step with RLE and then getting L foot caught behind with pt needing more min A for balance, but pt able to self correct    Ambulation Distance (Feet) 230 Feet   x1, 115' x1   Assistive device Rolling walker    Gait Pattern Decreased weight shift to right;Left flexed knee in stance;Right flexed knee in stance;Narrow base of support;Decreased step length - right;Decreased step length - left;Decreased stance time - right;Decreased hip/knee flexion - right;Step-through pattern    Ambulation Surface Level;Indoor      Knee/Hip Exercises: Supine   Heel Slides AROM;AAROM;Strengthening;2 sets;10 reps;Limitations;1 set    Heel Slides Limitations with patient  wearing socks, assist from therapist for R knee flexion due to tone    Other Supine Knee/Hip Exercises marching x10 reps B with AAROM with RLE and LLE      Knee/Hip Exercises: Sidelying   Hip ABduction AAROM;Strengthening;Both;10 reps;2 sets    Hip ABduction Limitations clamshells, 2 x 10 reps, therapist providing AAROM esp for RLE, and manual assist to keep sidelying positioning for proper technique                    PT Education - 07/28/20 1151     Education Details scheduling additional appts    Person(s) Educated Patient    Methods Explanation    Comprehension Verbalized understanding              PT Short Term Goals - 07/20/20 1609       PT SHORT TERM GOAL #1   Title Pt will perform stand pivot transfer with RW from w/c <> mat table with supervision to demo improved functional transfers. ALL STGS DUE 08/17/20    Baseline currently needs min guard.    Time 4   due to delay in scheduling   Period Weeks    Status New    Target Date 08/17/20      PT SHORT TERM GOAL #2   Title Pt will undergo assessment of gait speed with LTG to be written as appropriate.    Baseline not yet assessed.    Time 4    Period Weeks    Status New      PT SHORT TERM GOAL #3   Title Pt will ambulate at least 230' consistently with RW and min guard throughout in order to demo improved household mobility.    Baseline needs min A when more fatigued.    Time 4    Period Weeks    Status New      PT SHORT TERM GOAL #4   Title Pt will decr 5x sit <> Stand time to 33 seconds or less with UE support in order to demo improved functional transfers    Baseline 37.03 seconds from mat table slightly elevated, therapist needing to help steady RW    Time 4    Period Weeks    Status New      PT SHORT TERM GOAL #5   Title Pt will initiate aquatic therapy.    Baseline has not yet started.    Time 4    Period Weeks    Status New               PT Long Term Goals - 07/20/20 1612        PT LONG TERM GOAL #1   Title Pt and spouse will be independent with  final HEP for BLE stretching and strengthening. ALL LTGS DUE 09/14/20    Baseline pt will continue to benefit from ongoing additions    Time 8   due to delay in scheduling   Period Weeks    Status On-going    Target Date 09/14/20      PT LONG TERM GOAL #2   Title Gait speed with RW goal to be written as appropriate.    Baseline not yet assessed.    Time 8    Period Weeks    Status New      PT LONG TERM GOAL #3   Title Pt will decr 5x sit <> Stand time to 27 seconds or less with UE support in order to demo improved functional transfers    Baseline needing min guard/min A    Time 8    Period Weeks    Status New      PT LONG TERM GOAL #4   Title Pt will decr 5x sit <> Stand time to 28 seconds or less with UE support in order to demo improved functional transfers    Baseline 37.03 seconds from mat table slightly elevated, therapist needing to help steady RW    Time 8    Period Weeks    Status New      PT LONG TERM GOAL #5   Title Pt will ambulate at least 345'' over level surfaces with RW and min guard/min A in order to demo improved household mobility and improved endurance    Baseline 230' max distance in a single bout with min guard/min A    Time 8    Period Weeks    Status New                   Plan - 07/28/20 1154     Clinical Impression Statement Continued to progress gait distance during clinic today. Pt able to ambulate 230' with min guard with RW during 1st bout. Able to ambulate 115' during 2nd bout, but needing one instance of min A for balance due to pt getting his L foot caught behind his R. Remainder of session focused on BLE strengthening. Will continue to progress towards LTGs.    Personal Factors and Comorbidities Comorbidity 3+;Past/Current Experience    Comorbidities T8 paraplegia, diabetes, HTN    Examination-Activity Limitations Locomotion Level;Transfers;Stand     Examination-Participation Restrictions Community Activity;Yard Work   Community education officer Evolving/Moderate complexity    Rehab Potential Good    PT Frequency 2x / week   1-2x   PT Duration 8 weeks   8 weeks   PT Treatment/Interventions Aquatic Therapy;Manual techniques;Therapeutic exercise;Gait training;Neuromuscular re-education;Orthotic Fit/Training    PT Next Visit Plan continue seated/supine strengthening. gait training with RW and incr distance. sit <> stands. SciFit, dynamic seated balance (compliant surface)    PT Home Exercise Plan Access Code: [email or web address]    Consulted and Agree with Plan of Care Patient;Family member/caregiver    Family Member Consulted --             Patient will benefit from skilled therapeutic intervention in order to improve the following deficits and impairments:  Decreased balance, Decreased coordination, Decreased range of motion, Decreased strength, Decreased mobility, Postural dysfunction, Impaired tone, Decreased activity tolerance, Abnormal gait, Decreased endurance, Difficulty walking, Impaired sensation  Visit Diagnosis: Difficulty in walking, not elsewhere classified  Unsteadiness on feet  Muscle weakness (generalized)     Problem List Patient Active Problem List  Diagnosis Date Noted   Erectile dysfunction due to diseases classified elsewhere 03/02/2020   Wheelchair dependence 11/27/2019   Spasticity 08/03/2019   Paraplegia ([place]) 07/07/2019   Neurogenic bowel 07/07/2019   Neurogenic bladder 07/07/2019   Thoracic myelopathy 06/30/2019    Arliss Journey, PT, DPT  07/28/2020, 11:55 AM  [facility] [address] [place], [name], [address] Phone: [phone]   Fax:  [phone]  Name: DUNCAN ALEJANDRO MRN: [id number] Date of Birth: 1951/01/28

## 2020-08-02 ENCOUNTER — Ambulatory Visit: Payer: BC Managed Care – PPO | Admitting: Physical Therapy

## 2020-08-02 ENCOUNTER — Ambulatory Visit: Payer: No Typology Code available for payment source | Admitting: Physical Therapy

## 2020-08-04 ENCOUNTER — Ambulatory Visit: Payer: No Typology Code available for payment source | Admitting: Physical Therapy

## 2020-08-04 ENCOUNTER — Encounter: Payer: Self-pay | Admitting: Physical Therapy

## 2020-08-04 ENCOUNTER — Other Ambulatory Visit: Payer: Self-pay

## 2020-08-04 DIAGNOSIS — R29818 Other symptoms and signs involving the nervous system: Secondary | ICD-10-CM

## 2020-08-04 DIAGNOSIS — R262 Difficulty in walking, not elsewhere classified: Secondary | ICD-10-CM | POA: Diagnosis not present

## 2020-08-04 DIAGNOSIS — R2681 Unsteadiness on feet: Secondary | ICD-10-CM

## 2020-08-04 DIAGNOSIS — M6281 Muscle weakness (generalized): Secondary | ICD-10-CM

## 2020-08-04 NOTE — Therapy (Signed)
[facility] [address] [place], [name], [address] Phone: [phone]   Fax:  [phone]  Physical Therapy Treatment  Patient Details  Name: Timothy Davenport MRN: [id number] Date of Birth: [birth year] Referring Provider (PT): Juel Burrow    Encounter Date: 08/04/2020   PT End of Session - 08/04/20 1157     Visit Number 35    Number of Visits 66    Date for PT Re-Evaluation 10/18/20   60 DAY POC, 109 DAY CERT, 15 visits approved through 10/22/20   Authorization Type VA - 15 visits 02/05/20 - 06/04/20, 15 additional visits from 3/8-08/24/20, 15 additional visits through 10/22/20    Authorization - Visit Number 5    Authorization - Number of Visits 15    Progress Note Due on Visit 67    PT Start Time 1018    PT Stop Time 1059    PT Time Calculation (min) 41 min    Equipment Utilized During Treatment Gait belt    Activity Tolerance Patient tolerated treatment well    Behavior During Therapy WFL for tasks assessed/performed             Past Medical History:  Diagnosis Date   Colon polyp    Diabetes mellitus without complication ([place])    Diabetic neuropathy ([place])    HTN (hypertension)    Patella fracture    Renal calculi     Past Surgical History:  Procedure Laterality Date   LUMBAR LAMINECTOMY/DECOMPRESSION MICRODISCECTOMY N/A 07/01/2019   Procedure: LUMBAR LAMINECTOMY/DECOMPRESSION MICRODISCECTOMY 1 LEVEL, THORACIC SIX-SEVEN;  Surgeon: Consuella Lose, MD;  Location: [facility];  Service: Neurosurgery;  Laterality: N/A;  LUMBAR LAMINECTOMY/DECOMPRESSION MICRODISCECTOMY 1 LEVEL, THORACIC SIX-SEVEN    ORIF PATELLA     THORACIC DISCECTOMY N/A 07/03/2019   Procedure: Evacuation of Thoracic Hematoma;  Surgeon: Consuella Lose, MD;  Location: [facility];  Service: Neurosurgery;  Laterality: N/A;    There were no vitals filed for this visit.   Subjective Assessment - 08/04/20 1022     Subjective Nothing new since he  was last here. Walking at home has been going well.    Pertinent History T8 paraplegia, diabetes, HTN    How long can you stand comfortably? probably a couple minutes with RW.    How long can you walk comfortably? approx. 5-10 minutes with his son and RW.    Patient Stated Goals wants to be able to walk without any assistance    Currently in Pain? No/denies                               [facility] Adult PT Treatment/Exercise - 08/04/20 1031       Transfers   Transfers Sit to Stand;Stand to Sit;Lateral/Scoot Transfers    Sit to Stand 4: Min guard;From elevated surface;With upper extremity assist;From bed    Sit to Stand Details (indicate cue type and reason) from slightly elevated mat table    Stand to Sit 4: Min guard;With upper extremity assist;To bed;To elevated surface    Comments x3 reps prior to gait, plus approx. 5 additional reps performed throughout session      Ambulation/Gait   Ambulation/Gait Yes    Ambulation/Gait Assistance 4: Min guard;4: Min assist    Ambulation/Gait Assistance Details during last bout - pt with 2 instances of getting L foot caught behind R, needing min A for balance and PT steading RW as pt self  corrects. pt with incr forward flexed posture today during gait when more fatigued.    Ambulation Distance (Feet) 230 Feet   x1, 115 x 1   Assistive device Rolling walker    Gait Pattern Decreased weight shift to right;Left flexed knee in stance;Right flexed knee in stance;Narrow base of support;Decreased step length - right;Decreased step length - left;Decreased stance time - right;Decreased hip/knee flexion - right;Step-through pattern    Ambulation Surface Level;Indoor      Neuro Re-ed    Neuro Re-ed Details  standing at RW: working on tall posture and glute activation alternating UE lifts x5 reps B with single UE support on RW, standing at RW without UE support and wide BOS x20 seconds, x25 seconds with min guard/min A for balance, with  intermittent UE support on RW, x5 reps mini squats with cues for technique and glute/quad activation once in standing      Exercises   Exercises Other Exercises    Other Exercises  seated holding 3# ball, sitting back and bringing arms forward/extended and sitting back to midline for seated abdominal crunch x5 reps, leaning posteriorly while holding ball - trunk rotation to R and then L for oblique work x5 reps each side      Knee/Hip Exercises: Seated   Long Arc Quad AROM;Strengthening;Both;1 set;10 reps;Limitations    Long Arc Quad Limitations red band resistance to bil LE's with pt moving through full range, assist needed for full ROM with RLE                      PT Short Term Goals - 07/20/20 1609       PT SHORT TERM GOAL #1   Title Pt will perform stand pivot transfer with RW from w/c <> mat table with supervision to demo improved functional transfers. ALL STGS DUE 08/17/20    Baseline currently needs min guard.    Time 4   due to delay in scheduling   Period Weeks    Status New    Target Date 08/17/20      PT SHORT TERM GOAL #2   Title Pt will undergo assessment of gait speed with LTG to be written as appropriate.    Baseline not yet assessed.    Time 4    Period Weeks    Status New      PT SHORT TERM GOAL #3   Title Pt will ambulate at least 230' consistently with RW and min guard throughout in order to demo improved household mobility.    Baseline needs min A when more fatigued.    Time 4    Period Weeks    Status New      PT SHORT TERM GOAL #4   Title Pt will decr 5x sit <> Stand time to 33 seconds or less with UE support in order to demo improved functional transfers    Baseline 37.03 seconds from mat table slightly elevated, therapist needing to help steady RW    Time 4    Period Weeks    Status New      PT SHORT TERM GOAL #5   Title Pt will initiate aquatic therapy.    Baseline has not yet started.    Time 4    Period Weeks    Status New                PT Long Term Goals - 07/20/20 1612       PT LONG  TERM GOAL #1   Title Pt and spouse will be independent with final HEP for BLE stretching and strengthening. ALL LTGS DUE 09/14/20    Baseline pt will continue to benefit from ongoing additions    Time 8   due to delay in scheduling   Period Weeks    Status On-going    Target Date 09/14/20      PT LONG TERM GOAL #2   Title Gait speed with RW goal to be written as appropriate.    Baseline not yet assessed.    Time 8    Period Weeks    Status New      PT LONG TERM GOAL #3   Title Pt will decr 5x sit <> Stand time to 27 seconds or less with UE support in order to demo improved functional transfers    Baseline needing min guard/min A    Time 8    Period Weeks    Status New      PT LONG TERM GOAL #4   Title Pt will decr 5x sit <> Stand time to 28 seconds or less with UE support in order to demo improved functional transfers    Baseline 37.03 seconds from mat table slightly elevated, therapist needing to help steady RW    Time 8    Period Weeks    Status New      PT LONG TERM GOAL #5   Title Pt will ambulate at least 345'' over level surfaces with RW and min guard/min A in order to demo improved household mobility and improved endurance    Baseline 230' max distance in a single bout with min guard/min A    Time 8    Period Weeks    Status New                   Plan - 08/04/20 1158     Clinical Impression Statement Able to ambulate total of 345' today in 2 bouts - 1st of 230' and 2nd of 115' with RW, pt needing min A during 2nd bout of gait due to more narrow BOS and 2 instances of L foot get caught behind his R. Remainder of session focused on core and BLE strengthening and standing tolerance. Pt able to stand for 25 seconds without UE support at RW today beore needing to sit down.    Personal Factors and Comorbidities Comorbidity 3+;Past/Current Experience    Comorbidities T8 paraplegia, diabetes, HTN     Examination-Activity Limitations Locomotion Level;Transfers;Stand    Examination-Participation Restrictions Community Activity;Yard Work   Community education officer Evolving/Moderate complexity    Rehab Potential Good    PT Frequency 2x / week   1-2x   PT Duration 8 weeks   8 weeks   PT Treatment/Interventions Aquatic Therapy;Manual techniques;Therapeutic exercise;Gait training;Neuromuscular re-education;Orthotic Fit/Training    PT Next Visit Plan continue seated/supine strengthening. standing tolerance without UE support gait training with RW and incr distance. sit <> stands. SciFit, dynamic seated balance (compliant surface)    PT Home Exercise Plan Access Code: [email or web address]    Consulted and Agree with Plan of Care Patient;Family member/caregiver             Patient will benefit from skilled therapeutic intervention in order to improve the following deficits and impairments:  Decreased balance, Decreased coordination, Decreased range of motion, Decreased strength, Decreased mobility, Postural dysfunction, Impaired tone, Decreased activity tolerance, Abnormal gait, Decreased endurance, Difficulty walking, Impaired sensation  Visit  Diagnosis: Difficulty in walking, not elsewhere classified  Unsteadiness on feet  Muscle weakness (generalized)  Other symptoms and signs involving the nervous system     Problem List Patient Active Problem List   Diagnosis Date Noted   Erectile dysfunction due to diseases classified elsewhere 03/02/2020   Wheelchair dependence 11/27/2019   Spasticity 08/03/2019   Paraplegia ([place]) 07/07/2019   Neurogenic bowel 07/07/2019   Neurogenic bladder 07/07/2019   Thoracic myelopathy 06/30/2019    Arliss Journey, PT, DPT  08/04/2020, 12:00 PM  [facility] [address] [employer] [place], [name], [address] Phone: [phone]   Fax:  [phone]  Name: Timothy Davenport MRN:  [id number] Date of Birth: 11/03/1950

## 2020-08-09 ENCOUNTER — Ambulatory Visit: Payer: No Typology Code available for payment source | Admitting: Physical Therapy

## 2020-08-09 ENCOUNTER — Encounter: Payer: Self-pay | Admitting: Physical Therapy

## 2020-08-09 ENCOUNTER — Other Ambulatory Visit: Payer: Self-pay

## 2020-08-09 DIAGNOSIS — R29818 Other symptoms and signs involving the nervous system: Secondary | ICD-10-CM

## 2020-08-09 DIAGNOSIS — R2681 Unsteadiness on feet: Secondary | ICD-10-CM

## 2020-08-09 DIAGNOSIS — R262 Difficulty in walking, not elsewhere classified: Secondary | ICD-10-CM | POA: Diagnosis not present

## 2020-08-09 DIAGNOSIS — M6281 Muscle weakness (generalized): Secondary | ICD-10-CM

## 2020-08-09 NOTE — Therapy (Signed)
[facility] [address] [place], [name], [address] Phone: [phone]   Fax:  [phone]  Physical Therapy Treatment  Patient Details  Name: Timothy Davenport MRN: [id number] Date of Birth: 03/08/1950 Referring Provider (PT): Juel Burrow    Encounter Date: 08/09/2020   PT End of Session - 08/09/20 1549     Visit Number 36    Number of Visits 64    Date for PT Re-Evaluation 10/18/20   60 DAY POC, 16 DAY CERT, 15 visits approved through 10/22/20   Authorization Type VA - 15 visits 02/05/20 - 06/04/20, 15 additional visits from 3/8-08/24/20, 15 additional visits through 10/22/20    Authorization - Visit Number 6    Authorization - Number of Visits 15    Progress Note Due on Visit 80    PT Start Time 1100    PT Stop Time 1145    PT Time Calculation (min) 45 min    Equipment Utilized During Treatment Other (comment)   floatation belt, water walker, pool noodle, neck noodle   Activity Tolerance Patient tolerated treatment well    Behavior During Therapy [facility] for tasks assessed/performed             Past Medical History:  Diagnosis Date   Colon polyp    Diabetes mellitus without complication ([place])    Diabetic neuropathy ([place])    HTN (hypertension)    Patella fracture    Renal calculi     Past Surgical History:  Procedure Laterality Date   LUMBAR LAMINECTOMY/DECOMPRESSION MICRODISCECTOMY N/A 07/01/2019   Procedure: LUMBAR LAMINECTOMY/DECOMPRESSION MICRODISCECTOMY 1 LEVEL, THORACIC SIX-SEVEN;  Surgeon: Consuella Lose, MD;  Location: [facility];  Service: Neurosurgery;  Laterality: N/A;  LUMBAR LAMINECTOMY/DECOMPRESSION MICRODISCECTOMY 1 LEVEL, THORACIC SIX-SEVEN    ORIF PATELLA     THORACIC DISCECTOMY N/A 07/03/2019   Procedure: Evacuation of Thoracic Hematoma;  Surgeon: Consuella Lose, MD;  Location: [facility];  Service: Neurosurgery;  Laterality: N/A;    There were no vitals filed for this visit.   Subjective  Assessment - 08/09/20 1548     Subjective Denies any falls or changes since last visit.    Pertinent History T8 paraplegia, diabetes, HTN    How long can you stand comfortably? probably a couple minutes with RW.    How long can you walk comfortably? approx. 5-10 minutes with his son and RW.    Patient Stated Goals wants to be able to walk without any assistance    Currently in Pain? No/denies             Aquatic therapy at [facility] - water temp. 89 degrees   Patient seen for aquatic therapy today.  Treatment took place in water 3.6-4.5 feet deep depending upon activity.   Pt entered the pool via hydraulic chair lift; pt was transferred from wheelchair to/from chair lift with min assist via scoot.   Pt standing at pool edge with waist floatation belt and holding edge with bil UE assist.  Performed bil LE squats x 10.  Standing holding water walker pushing down into water with UE's into extension x 10 reps with min assist to balance.   Pt performed gait training with use of water walker for bil. UE support - pt held walker stabilized by PTA and min-mod assist.  Pt gait trained approx. 18' x 12 reps.   Pt seated on pool bench and performed bil LE LAQ x 10 reps and hip flexion x 10 reps.  Pt required AAROM for RLE.  PTA performed PROM of R LE into knee flexion to address tone in R quad.  Needed assist at times to maintain seated position on bench due to buoyancy.   Placed neck noodle, waist floatation and pool noodle on pt then assisted pt into supine position.  Performed hip abd/add x 15 x 2 sets.  Performed bil bicycling LE's with difficulty with RLE due to tone.  PTA then assisted pt with RLE hip/knee flexion for >20 reps.  Pt in same position and PTA placed bil feet against wall, pushed pt into hip/knee flexion then pt pushing into extension to propel self off wall.  Performed > 8 reps.      Pt requires buoyancy of water for support and for joint offloading; standing  activities are much safer in water with reduced fall risk compared to same activities performed on land.  Viscosity of water needed for resistance for strengthening bil. LE's.  Bouyancy supported exercises also assist with spasticity reduction.    PT Short Term Goals - 07/20/20 1609       PT SHORT TERM GOAL #1   Title Pt will perform stand pivot transfer with RW from w/c <> mat table with supervision to demo improved functional transfers. ALL STGS DUE 08/17/20    Baseline currently needs min guard.    Time 4   due to delay in scheduling   Period Weeks    Status New    Target Date 08/17/20      PT SHORT TERM GOAL #2   Title Pt will undergo assessment of gait speed with LTG to be written as appropriate.    Baseline not yet assessed.    Time 4    Period Weeks    Status New      PT SHORT TERM GOAL #3   Title Pt will ambulate at least 230' consistently with RW and min guard throughout in order to demo improved household mobility.    Baseline needs min A when more fatigued.    Time 4    Period Weeks    Status New      PT SHORT TERM GOAL #4   Title Pt will decr 5x sit <> Stand time to 33 seconds or less with UE support in order to demo improved functional transfers    Baseline 37.03 seconds from mat table slightly elevated, therapist needing to help steady RW    Time 4    Period Weeks    Status New      PT SHORT TERM GOAL #5   Title Pt will initiate aquatic therapy.    Baseline has not yet started.    Time 4    Period Weeks    Status New               PT Long Term Goals - 07/20/20 1612       PT LONG TERM GOAL #1   Title Pt and spouse will be independent with final HEP for BLE stretching and strengthening. ALL LTGS DUE 09/14/20    Baseline pt will continue to benefit from ongoing additions    Time 8   due to delay in scheduling   Period Weeks    Status On-going    Target Date 09/14/20      PT LONG TERM GOAL #2   Title Gait speed with RW goal to be written as  appropriate.    Baseline not yet assessed.    Time 8  Period Weeks    Status New      PT LONG TERM GOAL #3   Title Pt will decr 5x sit <> Stand time to 27 seconds or less with UE support in order to demo improved functional transfers    Baseline needing min guard/min A    Time 8    Period Weeks    Status New      PT LONG TERM GOAL #4   Title Pt will decr 5x sit <> Stand time to 28 seconds or less with UE support in order to demo improved functional transfers    Baseline 37.03 seconds from mat table slightly elevated, therapist needing to help steady RW    Time 8    Period Weeks    Status New      PT LONG TERM GOAL #5   Title Pt will ambulate at least 345'' over level surfaces with RW and min guard/min A in order to demo improved household mobility and improved endurance    Baseline 230' max distance in a single bout with min guard/min A    Time 8    Period Weeks    Status New                   Plan - 08/09/20 1550     Clinical Impression Statement Skilled aquatic session focused on strength, gait and balance.  Pt needs UE support bil with all standing and gait with significant assist of PTA.  Pt works hard during session without rest breaks.  Cont per poc.    Personal Factors and Comorbidities Comorbidity 3+;Past/Current Experience    Comorbidities T8 paraplegia, diabetes, HTN    Examination-Activity Limitations Locomotion Level;Transfers;Stand    Examination-Participation Restrictions Community Activity;Yard Work   Community education officer Evolving/Moderate complexity    Rehab Potential Good    PT Frequency 2x / week   1-2x   PT Duration 8 weeks   8 weeks   PT Treatment/Interventions Aquatic Therapy;Manual techniques;Therapeutic exercise;Gait training;Neuromuscular re-education;Orthotic Fit/Training    PT Next Visit Plan continue seated/supine strengthening. standing tolerance without UE support gait training with RW and incr distance. sit <>  stands. SciFit, dynamic seated balance (compliant surface)    PT Home Exercise Plan Access Code: [email or web address]    Consulted and Agree with Plan of Care Patient             Patient will benefit from skilled therapeutic intervention in order to improve the following deficits and impairments:  Decreased balance, Decreased coordination, Decreased range of motion, Decreased strength, Decreased mobility, Postural dysfunction, Impaired tone, Decreased activity tolerance, Abnormal gait, Decreased endurance, Difficulty walking, Impaired sensation  Visit Diagnosis: Difficulty in walking, not elsewhere classified  Unsteadiness on feet  Muscle weakness (generalized)  Other symptoms and signs involving the nervous system     Problem List Patient Active Problem List   Diagnosis Date Noted   Erectile dysfunction due to diseases classified elsewhere 03/02/2020   Wheelchair dependence 11/27/2019   Spasticity 08/03/2019   Paraplegia ([place]) 07/07/2019   Neurogenic bowel 07/07/2019   Neurogenic bladder 07/07/2019   Thoracic myelopathy 06/30/2019   Narda Bonds, PTA [place] 08/09/20 4:47 PM Phone: [phone] Fax: [facility] [address] [place], [name], [address] Phone: [phone]   Fax:  [phone]  Name: Timothy Davenport MRN: [id number] Date of Birth: Jul 17, 1950

## 2020-08-11 ENCOUNTER — Other Ambulatory Visit: Payer: Self-pay

## 2020-08-11 ENCOUNTER — Ambulatory Visit: Payer: No Typology Code available for payment source | Admitting: Physical Therapy

## 2020-08-11 ENCOUNTER — Encounter: Payer: Self-pay | Admitting: Physical Therapy

## 2020-08-11 DIAGNOSIS — R2681 Unsteadiness on feet: Secondary | ICD-10-CM

## 2020-08-11 DIAGNOSIS — R262 Difficulty in walking, not elsewhere classified: Secondary | ICD-10-CM | POA: Diagnosis not present

## 2020-08-11 DIAGNOSIS — R293 Abnormal posture: Secondary | ICD-10-CM

## 2020-08-11 DIAGNOSIS — M6281 Muscle weakness (generalized): Secondary | ICD-10-CM

## 2020-08-11 NOTE — Therapy (Signed)
[facility] [address] [place], [name], [address] Phone: [phone]   Fax:  [phone]  Physical Therapy Treatment  Patient Details  Name: Timothy Davenport MRN: [id number] Date of Birth: Jan 18, 1951 Referring Provider (PT): Juel Burrow    Encounter Date: 08/11/2020   PT End of Session - 08/11/20 1158     Visit Number 37    Number of Visits 66    Date for PT Re-Evaluation 10/18/20   60 DAY POC, 28 DAY CERT, 15 visits approved through 10/22/20   Authorization Type VA - 15 visits 02/05/20 - 06/04/20, 15 additional visits from 3/8-08/24/20, 15 additional visits through 10/22/20    Authorization - Visit Number 7    Authorization - Number of Visits 15    Progress Note Due on Visit 74    PT Start Time 1104    PT Stop Time 1145    PT Time Calculation (min) 41 min    Equipment Utilized During Treatment Gait belt    Activity Tolerance Patient tolerated treatment well    Behavior During Therapy WFL for tasks assessed/performed             Past Medical History:  Diagnosis Date   Colon polyp    Diabetes mellitus without complication ([place])    Diabetic neuropathy ([place])    HTN (hypertension)    Patella fracture    Renal calculi     Past Surgical History:  Procedure Laterality Date   LUMBAR LAMINECTOMY/DECOMPRESSION MICRODISCECTOMY N/A 07/01/2019   Procedure: LUMBAR LAMINECTOMY/DECOMPRESSION MICRODISCECTOMY 1 LEVEL, THORACIC SIX-SEVEN;  Surgeon: Consuella Lose, MD;  Location: [facility];  Service: Neurosurgery;  Laterality: N/A;  LUMBAR LAMINECTOMY/DECOMPRESSION MICRODISCECTOMY 1 LEVEL, THORACIC SIX-SEVEN    ORIF PATELLA     THORACIC DISCECTOMY N/A 07/03/2019   Procedure: Evacuation of Thoracic Hematoma;  Surgeon: Consuella Lose, MD;  Location: [facility];  Service: Neurosurgery;  Laterality: N/A;    There were no vitals filed for this visit.   Subjective Assessment - 08/11/20 1105     Subjective No changes or falls.  Pool is going good.    Pertinent History T8 paraplegia, diabetes, HTN    How long can you stand comfortably? probably a couple minutes with RW.    How long can you walk comfortably? approx. 5-10 minutes with his son and RW.    Patient Stated Goals wants to be able to walk without any assistance    Currently in Pain? No/denies                               [facility] Adult PT Treatment/Exercise - 08/11/20 1111       Transfers   Transfers Sit to Stand;Stand to Sit;Lateral/Scoot Transfers    Sit to Stand 4: Min guard;From elevated surface;With upper extremity assist;From bed    Five time sit to stand comments  25.4 seconds from mat table with single UE support on mat and other on RW, therapist helping to steady RW    Stand to Sit 4: Min guard;With upper extremity assist;To bed;To elevated surface    Lateral/Scoot Transfers 6: Modified independent (Device/Increase time)    Lateral/Scoot Transfer Details (indicate cue type and reason) w/c to mat table    Comments plus an additional x2 reps before gait      Ambulation/Gait   Ambulation/Gait Yes    Ambulation/Gait Assistance 4: Min guard;4: Min assist    Ambulation/Gait Assistance Details min  guard during first 2 laps.  cues throughout for posture and for R foot placement at times with wider BOS esp when going around turns. wheelchair follow during last lap of 2nd bout (but not needed), with pt needing min A for balance and for RLE foot clearance and hip/knee flexion when more fatigued.    Ambulation Distance (Feet) 230 Feet   x2   Assistive device Rolling walker    Gait Pattern Decreased weight shift to right;Left flexed knee in stance;Right flexed knee in stance;Narrow base of support;Decreased step length - right;Decreased step length - left;Decreased stance time - right;Decreased hip/knee flexion - right;Step-through pattern    Ambulation Surface Level;Indoor      Knee/Hip Exercises: Supine   Bridges AROM;Strengthening;1  set;10 reps    Bridges Limitations with BLE over red physioball and legs extended, cues for full ROM, therapist helping to steady ball.      Knee/Hip Exercises: Prone   Hamstring Curl 1 set;10 reps    Hamstring Curl Limitations pt prone on mat table, x10 reps each side with therapist providing AAROM throughout esp with RLE                    PT Education - 08/11/20 1157     Education Details discussed pt lying prone at home for a couple minutes for a prolonged hip flexor stretch, pt performed today on mat table and did not need a pillow under hips    Person(s) Educated Patient    Methods Explanation    Comprehension Verbalized understanding              PT Short Term Goals - 08/11/20 1107       PT SHORT TERM GOAL #1   Title Pt will perform stand pivot transfer with RW from w/c <> mat table with supervision to demo improved functional transfers. ALL STGS DUE 08/17/20    Baseline currently needs min guard.    Time 4   due to delay in scheduling   Period Weeks    Status New    Target Date 08/17/20      PT SHORT TERM GOAL #2   Title Pt will undergo assessment of gait speed with LTG to be written as appropriate.    Baseline not yet assessed.    Time 4    Period Weeks    Status New      PT SHORT TERM GOAL #3   Title Pt will ambulate at least 230' consistently with RW and min guard throughout in order to demo improved household mobility.    Baseline needs min A when more fatigued.    Time 4    Period Weeks    Status New      PT SHORT TERM GOAL #4   Title Pt will decr 5x sit <> Stand time to 33 seconds or less with UE support in order to demo improved functional transfers    Baseline 37.03 seconds from mat table slightly elevated, therapist needing to help steady RW; 25.4 seconds on 08/11/20 seconds with therapist needing to help steady RW    Time 4    Period Weeks    Status Achieved      PT SHORT TERM GOAL #5   Title Pt will initiate aquatic therapy.     Baseline met    Time 4    Period Weeks    Status Achieved  PT Long Term Goals - 08/11/20 1118       PT LONG TERM GOAL #1   Title Pt and spouse will be independent with final HEP for BLE stretching and strengthening. ALL LTGS DUE 09/14/20    Baseline pt will continue to benefit from ongoing additions    Time 8   due to delay in scheduling   Period Weeks    Status On-going      PT LONG TERM GOAL #2   Title Gait speed with RW goal to be written as appropriate.    Baseline not yet assessed.    Time 8    Period Weeks    Status New      PT LONG TERM GOAL #3   Title Pt will decr 5x sit <> Stand time to 23 seconds or less with UE support and supervision in order to demo improved functional transfers    Baseline 25.4 seconds on 08/11/20 seconds with therapist needing to help steady RW    Time 8    Period Weeks    Status Revised      PT LONG TERM GOAL #4   Title --    Baseline removed goal as duplicate from LTG #3    Time --    Period --    Status --      PT LONG TERM GOAL #5   Title Pt will ambulate at least 345'' over level surfaces with RW and min guard/min A in order to demo improved household mobility and improved endurance    Baseline 230' max distance in a single bout with min guard/min A    Time 8    Period Weeks    Status New                   Plan - 08/11/20 1201     Clinical Impression Statement Pt has met STGs #4 and #5. Pt able to improve 5x sit <> stand time today from slightly elevated mat table to 25.4 seconds using UE support (previously 37.03 seconds), does need therapist to help steady RW when standing. Pt able to improve his gait distance today - performed 2 bouts of 230' with RW. During last lap of gait pt needing min A from therapist to help with RLE foot clearance when more fatigued. Will continue to progress towards LTGs.    Personal Factors and Comorbidities Comorbidity 3+;Past/Current Experience    Comorbidities T8  paraplegia, diabetes, HTN    Examination-Activity Limitations Locomotion Level;Transfers;Stand    Examination-Participation Restrictions Community Activity;Yard Work   Community education officer Evolving/Moderate complexity    Rehab Potential Good    PT Frequency 2x / week   1-2x   PT Duration 8 weeks   8 weeks   PT Treatment/Interventions Aquatic Therapy;Manual techniques;Therapeutic exercise;Gait training;Neuromuscular re-education;Orthotic Fit/Training    PT Next Visit Plan check STGs. continue seated/supine strengthening. quadraped and maybe try tall kneeling. standing tolerance without UE support, gait training with RW and incr distance. SciFit.    PT Home Exercise Plan Access Code: [email or web address]    Consulted and Agree with Plan of Care Patient             Patient will benefit from skilled therapeutic intervention in order to improve the following deficits and impairments:  Decreased balance, Decreased coordination, Decreased range of motion, Decreased strength, Decreased mobility, Postural dysfunction, Impaired tone, Decreased activity tolerance, Abnormal gait, Decreased endurance, Difficulty walking, Impaired sensation  Visit Diagnosis: Difficulty in  walking, not elsewhere classified  Unsteadiness on feet  Muscle weakness (generalized)  Abnormal posture     Problem List Patient Active Problem List   Diagnosis Date Noted   Erectile dysfunction due to diseases classified elsewhere 03/02/2020   Wheelchair dependence 11/27/2019   Spasticity 08/03/2019   Paraplegia ([place]) 07/07/2019   Neurogenic bowel 07/07/2019   Neurogenic bladder 07/07/2019   Thoracic myelopathy 06/30/2019    Arliss Journey, PT, DPT  08/11/2020, 12:02 PM  [facility] [address] [place], [name], [address] Phone: [phone]   Fax:  [phone]  Name: Timothy Davenport MRN: [id number] Date of Birth: 05-07-1950

## 2020-08-15 ENCOUNTER — Other Ambulatory Visit: Payer: Self-pay

## 2020-08-15 ENCOUNTER — Ambulatory Visit: Payer: No Typology Code available for payment source | Admitting: Physical Therapy

## 2020-08-15 DIAGNOSIS — R29818 Other symptoms and signs involving the nervous system: Secondary | ICD-10-CM

## 2020-08-15 DIAGNOSIS — R2681 Unsteadiness on feet: Secondary | ICD-10-CM

## 2020-08-15 DIAGNOSIS — R262 Difficulty in walking, not elsewhere classified: Secondary | ICD-10-CM

## 2020-08-15 DIAGNOSIS — M6281 Muscle weakness (generalized): Secondary | ICD-10-CM

## 2020-08-16 ENCOUNTER — Ambulatory Visit: Payer: BC Managed Care – PPO | Admitting: Physical Therapy

## 2020-08-16 NOTE — Therapy (Signed)
[facility] [address] [place], [name], [address] Phone: [phone]   Fax:  [phone]  Physical Therapy Treatment  Patient Details  Name: Timothy Davenport MRN: [id number] Date of Birth: Aug 24, 1950 Referring Provider (PT): Juel Burrow    Encounter Date: 08/15/2020   PT End of Session - 08/16/20 1438     Visit Number 38    Number of Visits 45    Date for PT Re-Evaluation 10/18/20   60 DAY POC, 2 DAY CERT, 15 visits approved through 10/22/20   Authorization Type VA - 15 visits 02/05/20 - 06/04/20, 15 additional visits from 3/8-08/24/20, 15 additional visits through 10/22/20    Authorization - Visit Number 8    Authorization - Number of Visits 15    Progress Note Due on Visit 74    PT Start Time 1230    PT Stop Time 1315    PT Time Calculation (min) 45 min    Equipment Utilized During Treatment Other (comment)   floatation belt, pool noodle, water walker   Activity Tolerance Patient tolerated treatment well    Behavior During Therapy WFL for tasks assessed/performed             Past Medical History:  Diagnosis Date   Colon polyp    Diabetes mellitus without complication ([place])    Diabetic neuropathy ([place])    HTN (hypertension)    Patella fracture    Renal calculi     Past Surgical History:  Procedure Laterality Date   LUMBAR LAMINECTOMY/DECOMPRESSION MICRODISCECTOMY N/A 07/01/2019   Procedure: LUMBAR LAMINECTOMY/DECOMPRESSION MICRODISCECTOMY 1 LEVEL, THORACIC SIX-SEVEN;  Surgeon: Consuella Lose, MD;  Location: [facility];  Service: Neurosurgery;  Laterality: N/A;  LUMBAR LAMINECTOMY/DECOMPRESSION MICRODISCECTOMY 1 LEVEL, THORACIC SIX-SEVEN    ORIF PATELLA     THORACIC DISCECTOMY N/A 07/03/2019   Procedure: Evacuation of Thoracic Hematoma;  Surgeon: Consuella Lose, MD;  Location: [facility];  Service: Neurosurgery;  Laterality: N/A;    There were no vitals filed for this visit.   Subjective Assessment -  08/16/20 1437     Subjective No falls or changes    Pertinent History T8 paraplegia, diabetes, HTN    How long can you stand comfortably? probably a couple minutes with RW.    How long can you walk comfortably? approx. 5-10 minutes with his son and RW.    Patient Stated Goals wants to be able to walk without any assistance    Currently in Pain? No/denies             Aquatic therapy at [facility]   Patient seen for aquatic therapy today.  Treatment took place in water 3.6-4.5 feet deep depending upon activity.   Pt entered the pool via hydraulic chair lift; pt was transferred from wheelchair to/from chair lift with set up only via scoot.   Pt standing at pool edge with waist floatation belt and holding edge with bil UE assist.  Performed bil LE squats x 10 x 2 reps.   Pt performed gait training with use of water walker for bil. UE support - pt held walker stabilized by PTA and min-mod assist.  Pt gait trained approx. 18' x 12 reps.   Pt seated on pool bench and performed bil LE LAQ x 10 reps and hip flexion x 10 reps.  Pt required AAROM for RLE.  PTA performed PROM of R LE into knee flexion to address tone in R quad.  Needed assist at times to  maintain seated position on bench due to buoyancy.   Placed neck noodle, waist floatation and pool noodle on pt then assisted pt into supine position.  Performed hip abd/add x 15 x 2 sets.  Performed bil bicycling LE's with difficulty with RLE due to tone.  PTA then assisted pt with RLE hip/knee flexion for >20 reps.  Pt in same position and PTA placed bil feet against wall, pushed pt into hip/knee flexion then pt pushing into extension to propel self off wall.  Performed > 8 reps.      Pt requires buoyancy of water for support and for joint offloading; standing activities are much safer in water with reduced fall risk compared to same activities performed on land.  Viscosity of water needed for resistance for strengthening bil. LE's.   Bouyancy supported exercises also assist with spasticity reduction.    PT Short Term Goals - 08/11/20 1107       PT SHORT TERM GOAL #1   Title Pt will perform stand pivot transfer with RW from w/c <> mat table with supervision to demo improved functional transfers. ALL STGS DUE 08/17/20    Baseline currently needs min guard.    Time 4   due to delay in scheduling   Period Weeks    Status New    Target Date 08/17/20      PT SHORT TERM GOAL #2   Title Pt will undergo assessment of gait speed with LTG to be written as appropriate.    Baseline not yet assessed.    Time 4    Period Weeks    Status New      PT SHORT TERM GOAL #3   Title Pt will ambulate at least 230' consistently with RW and min guard throughout in order to demo improved household mobility.    Baseline needs min A when more fatigued.    Time 4    Period Weeks    Status New      PT SHORT TERM GOAL #4   Title Pt will decr 5x sit <> Stand time to 33 seconds or less with UE support in order to demo improved functional transfers    Baseline 37.03 seconds from mat table slightly elevated, therapist needing to help steady RW; 25.4 seconds on 08/11/20 seconds with therapist needing to help steady RW    Time 4    Period Weeks    Status Achieved      PT SHORT TERM GOAL #5   Title Pt will initiate aquatic therapy.    Baseline met    Time 4    Period Weeks    Status Achieved               PT Long Term Goals - 08/11/20 1118       PT LONG TERM GOAL #1   Title Pt and spouse will be independent with final HEP for BLE stretching and strengthening. ALL LTGS DUE 09/14/20    Baseline pt will continue to benefit from ongoing additions    Time 8   due to delay in scheduling   Period Weeks    Status On-going      PT LONG TERM GOAL #2   Title Gait speed with RW goal to be written as appropriate.    Baseline not yet assessed.    Time 8    Period Weeks    Status New      PT LONG TERM GOAL #3   Title Pt will  decr 5x  sit <> Stand time to 23 seconds or less with UE support and supervision in order to demo improved functional transfers    Baseline 25.4 seconds on 08/11/20 seconds with therapist needing to help steady RW    Time 8    Period Weeks    Status Revised      PT LONG TERM GOAL #4   Title --    Baseline removed goal as duplicate from LTG #3    Time --    Period --    Status --      PT LONG TERM GOAL #5   Title Pt will ambulate at least 345'' over level surfaces with RW and min guard/min A in order to demo improved household mobility and improved endurance    Baseline 230' max distance in a single bout with min guard/min A    Time 8    Period Weeks    Status New                   Plan - 08/16/20 1439     Clinical Impression Statement Pt continues to requires min-mod assist for aquatic session.  Decreased upright trunk.  Pt benefits from buoyancy of water for increased ease with gait and decreased fall risk.  Cont per poc.    Personal Factors and Comorbidities Comorbidity 3+;Past/Current Experience    Comorbidities T8 paraplegia, diabetes, HTN    Examination-Activity Limitations Locomotion Level;Transfers;Stand    Examination-Participation Restrictions Community Activity;Yard Work   Community education officer Evolving/Moderate complexity    Rehab Potential Good    PT Frequency 2x / week   1-2x   PT Duration 8 weeks   8 weeks   PT Treatment/Interventions Aquatic Therapy;Manual techniques;Therapeutic exercise;Gait training;Neuromuscular re-education;Orthotic Fit/Training    PT Next Visit Plan check STGs. continue seated/supine strengthening. quadraped and maybe try tall kneeling. standing tolerance without UE support, gait training with RW and incr distance. SciFit.    PT Home Exercise Plan Access Code: [email or web address]    Consulted and Agree with Plan of Care Patient             Patient will benefit from skilled therapeutic intervention in order to improve the  following deficits and impairments:  Decreased balance, Decreased coordination, Decreased range of motion, Decreased strength, Decreased mobility, Postural dysfunction, Impaired tone, Decreased activity tolerance, Abnormal gait, Decreased endurance, Difficulty walking, Impaired sensation  Visit Diagnosis: Difficulty in walking, not elsewhere classified  Unsteadiness on feet  Muscle weakness (generalized)  Other symptoms and signs involving the nervous system     Problem List Patient Active Problem List   Diagnosis Date Noted   Erectile dysfunction due to diseases classified elsewhere 03/02/2020   Wheelchair dependence 11/27/2019   Spasticity 08/03/2019   Paraplegia ([place]) 07/07/2019   Neurogenic bowel 07/07/2019   Neurogenic bladder 07/07/2019   Thoracic myelopathy 06/30/2019   Narda Bonds, PTA [place] 08/16/20 2:43 PM Phone: [phone] Fax: [facility] [address] [place], [name], [address] Phone: [phone]   Fax:  [phone]  Name: EDY BELT MRN: [id number] Date of Birth: 06-28-1950

## 2020-08-18 ENCOUNTER — Encounter: Payer: Self-pay | Admitting: Physical Therapy

## 2020-08-18 ENCOUNTER — Ambulatory Visit: Payer: BC Managed Care – PPO | Admitting: Physical Therapy

## 2020-08-18 ENCOUNTER — Other Ambulatory Visit: Payer: Self-pay

## 2020-08-18 ENCOUNTER — Ambulatory Visit: Payer: No Typology Code available for payment source | Admitting: Physical Therapy

## 2020-08-18 DIAGNOSIS — R293 Abnormal posture: Secondary | ICD-10-CM

## 2020-08-18 DIAGNOSIS — R262 Difficulty in walking, not elsewhere classified: Secondary | ICD-10-CM

## 2020-08-18 DIAGNOSIS — M6281 Muscle weakness (generalized): Secondary | ICD-10-CM

## 2020-08-18 DIAGNOSIS — R2681 Unsteadiness on feet: Secondary | ICD-10-CM

## 2020-08-18 NOTE — Therapy (Signed)
[facility] [address] [place], [name], [address] Phone: [phone]   Fax:  [phone]  Physical Therapy Treatment  Patient Details  Name: Timothy Davenport MRN: [id number] Date of Birth: August 08, 1950 Referring Provider (PT): Juel Burrow    Encounter Date: 08/18/2020   PT End of Session - 08/18/20 1104     Visit Number 39    Number of Visits 62    Date for PT Re-Evaluation 10/18/20   60 DAY POC, 31 DAY CERT, 15 visits approved through 10/22/20   Authorization Type VA - 15 visits 02/05/20 - 06/04/20, 15 additional visits from 3/8-08/24/20, 15 additional visits through 10/22/20    Authorization - Visit Number 9    Authorization - Number of Visits 15    Progress Note Due on Visit 59    PT Start Time 1102    PT Stop Time 1145    PT Time Calculation (min) 43 min    Equipment Utilized During Treatment Other (comment)   floatation belt, pool noodle, water walker   Activity Tolerance Patient tolerated treatment well    Behavior During Therapy WFL for tasks assessed/performed             Past Medical History:  Diagnosis Date   Colon polyp    Diabetes mellitus without complication ([place])    Diabetic neuropathy ([place])    HTN (hypertension)    Patella fracture    Renal calculi     Past Surgical History:  Procedure Laterality Date   LUMBAR LAMINECTOMY/DECOMPRESSION MICRODISCECTOMY N/A 07/01/2019   Procedure: LUMBAR LAMINECTOMY/DECOMPRESSION MICRODISCECTOMY 1 LEVEL, THORACIC SIX-SEVEN;  Surgeon: Consuella Lose, MD;  Location: [facility];  Service: Neurosurgery;  Laterality: N/A;  LUMBAR LAMINECTOMY/DECOMPRESSION MICRODISCECTOMY 1 LEVEL, THORACIC SIX-SEVEN    ORIF PATELLA     THORACIC DISCECTOMY N/A 07/03/2019   Procedure: Evacuation of Thoracic Hematoma;  Surgeon: Consuella Lose, MD;  Location: [facility];  Service: Neurosurgery;  Laterality: N/A;    There were no vitals filed for this visit.   Subjective Assessment -  08/18/20 1103     Subjective No new complaints. No falls or pain to report. Enjoys the aquatic therapy.    Pertinent History T8 paraplegia, diabetes, HTN    How long can you stand comfortably? probably a couple minutes with RW.    How long can you walk comfortably? approx. 5-10 minutes with his son and RW.    Patient Stated Goals wants to be able to walk without any assistance    Currently in Pain? No/denies    Pain Score 0-No pain                   [facility] Adult PT Treatment/Exercise - 08/18/20 1105       Transfers   Transfers Sit to Stand;Stand to [employer] Transfers    Sit to Stand 4: Min guard;With upper extremity assist;From bed;From chair/3-in-1    Sit to Stand Details (indicate cue type and reason) wheelchair>RW, then mat>RW with increased time/effort needed from lower surfaces    Stand to Sit 4: Min guard;With upper extremity assist;To bed    Stand to Sit Details RW> mat table in lowest posiiton on 1st rep, then elevated for consecutive reps with cues to use UE's for controlled descent    Stand Pivot Transfers 4: Min guard    Stand Pivot Transfer Details (indicate cue type and reason) with RW from w/c to mat table with cues on sequencing and posture.  Ambulation/Gait   Ambulation/Gait Yes    Ambulation/Gait Assistance 4: Min guard;4: Min assist    Ambulation/Gait Assistance Details moslty min guard assist with gait today with min assist needed for last ~70 feet of second gait rep. cues needed on posture and right step length. no assistance needed to advance right foot this session.    Ambulation Distance (Feet) 260 Feet   x1, 230 x1   Assistive device Rolling walker    Gait Pattern Decreased weight shift to right;Left flexed knee in stance;Right flexed knee in stance;Narrow base of support;Decreased step length - right;Decreased step length - left;Decreased stance time - right;Decreased hip/knee flexion - right;Step-through pattern    Ambulation Surface  Level;Indoor    Gait velocity 33.62 sec's= 0.98 ft/sec with RW/brace      Exercises   Exercises Other Exercises    Other Exercises  lying on mat table with red pball under knees for bridging for 2 sets of 10 reps with cues for full hip extension; then with ball at feet for HS curls for 2 sets of 10 reps. assist to keep feet on ball and for form/technique.                      PT Short Term Goals - 08/18/20 1110       PT SHORT TERM GOAL #1   Title Pt will perform stand pivot transfer with RW from w/c <> mat table with supervision to demo improved functional transfers. ALL STGS DUE 08/17/20    Baseline 08/18/20: close supervision    Time --   due to delay in scheduling   Status Achieved    Target Date 08/17/20      PT SHORT TERM GOAL #2   Title Pt will undergo assessment of gait speed with LTG to be written as appropriate.    Baseline 07/22/20: 0.98 ft/sec with RW/brace. Primary PT to set goal    Time --    Period --    Status Achieved      PT SHORT TERM GOAL #3   Title Pt will ambulate at least 230' consistently with RW and min guard throughout in order to demo improved household mobility.    Baseline 08/18/20; pt able to achieve distance with min guard to min assist for balance    Time --    Period --    Status Partially Met      PT SHORT TERM GOAL #4   Title Pt will decr 5x sit <> Stand time to 33 seconds or less with UE support in order to demo improved functional transfers    Baseline 08/11/20: 25.4 seconds  with therapist needing to help steady RW    Status Achieved      PT SHORT TERM GOAL #5   Title Pt will initiate aquatic therapy.    Baseline 08/18/20: met in session    Status Achieved               PT Long Term Goals - 08/11/20 1118       PT LONG TERM GOAL #1   Title Pt and spouse will be independent with final HEP for BLE stretching and strengthening. ALL LTGS DUE 09/14/20    Baseline pt will continue to benefit from ongoing additions    Time 8    due to delay in scheduling   Period Weeks    Status On-going      PT LONG TERM GOAL #2   Title  Gait speed with RW goal to be written as appropriate.    Baseline not yet assessed.    Time 8    Period Weeks    Status New      PT LONG TERM GOAL #3   Title Pt will decr 5x sit <> Stand time to 23 seconds or less with UE support and supervision in order to demo improved functional transfers    Baseline 25.4 seconds on 08/11/20 seconds with therapist needing to help steady RW    Time 8    Period Weeks    Status Revised      PT LONG TERM GOAL #4   Title --    Baseline removed goal as duplicate from LTG #3    Time --    Period --    Status --      PT LONG TERM GOAL #5   Title Pt will ambulate at least 345'' over level surfaces with RW and min guard/min A in order to demo improved household mobility and improved endurance    Baseline 230' max distance in a single bout with min guard/min A    Time 8    Period Weeks    Status New                   Plan - 08/18/20 1104     Clinical Impression Statement Today's skilled session focused on progress toward STGs and continued to focus on gait with RW and strengthening. No issues noted or reported in session. The pt is making steady progress toward goals and should benefit from continued PT to progress toward unmet goals.    Personal Factors and Comorbidities Comorbidity 3+;Past/Current Experience    Comorbidities T8 paraplegia, diabetes, HTN    Examination-Activity Limitations Locomotion Level;Transfers;Stand    Examination-Participation Restrictions Community Activity;Yard Work   Community education officer Evolving/Moderate complexity    Rehab Potential Good    PT Frequency 2x / week   1-2x   PT Duration 8 weeks   8 weeks   PT Treatment/Interventions Aquatic Therapy;Manual techniques;Therapeutic exercise;Gait training;Neuromuscular re-education;Orthotic Fit/Training    PT Next Visit Plan continue seated/supine  strengthening. quadraped and maybe try tall kneeling. standing tolerance without UE support, gait training with RW and incr distance. SciFit.    PT Home Exercise Plan Access Code: [email or web address]    Consulted and Agree with Plan of Care Patient             Patient will benefit from skilled therapeutic intervention in order to improve the following deficits and impairments:  Decreased balance, Decreased coordination, Decreased range of motion, Decreased strength, Decreased mobility, Postural dysfunction, Impaired tone, Decreased activity tolerance, Abnormal gait, Decreased endurance, Difficulty walking, Impaired sensation  Visit Diagnosis: Difficulty in walking, not elsewhere classified  Unsteadiness on feet  Muscle weakness (generalized)  Abnormal posture     Problem List Patient Active Problem List   Diagnosis Date Noted   Erectile dysfunction due to diseases classified elsewhere 03/02/2020   Wheelchair dependence 11/27/2019   Spasticity 08/03/2019   Paraplegia ([place]) 07/07/2019   Neurogenic bowel 07/07/2019   Neurogenic bladder 07/07/2019   Thoracic myelopathy 06/30/2019    [place] Ora, PTA, [facility] [address], [place], [place] [address] [phone] 08/18/20, 10:36 PM   Name: Timothy Davenport MRN: [id number] Date of Birth: August 01, 1950

## 2020-08-23 ENCOUNTER — Encounter: Payer: Self-pay | Admitting: Physical Therapy

## 2020-08-23 ENCOUNTER — Other Ambulatory Visit: Payer: Self-pay

## 2020-08-23 ENCOUNTER — Ambulatory Visit: Payer: No Typology Code available for payment source | Attending: Family Medicine | Admitting: Physical Therapy

## 2020-08-23 DIAGNOSIS — R293 Abnormal posture: Secondary | ICD-10-CM | POA: Diagnosis present

## 2020-08-23 DIAGNOSIS — R262 Difficulty in walking, not elsewhere classified: Secondary | ICD-10-CM | POA: Insufficient documentation

## 2020-08-23 DIAGNOSIS — R2681 Unsteadiness on feet: Secondary | ICD-10-CM | POA: Diagnosis present

## 2020-08-23 DIAGNOSIS — M6281 Muscle weakness (generalized): Secondary | ICD-10-CM | POA: Diagnosis present

## 2020-08-23 DIAGNOSIS — R29818 Other symptoms and signs involving the nervous system: Secondary | ICD-10-CM | POA: Insufficient documentation

## 2020-08-23 NOTE — Therapy (Addendum)
[facility] [address] [place], [name], [address] Phone: [phone]   Fax:  [phone]  Physical Therapy Treatment/10th Visit Progress Note  Patient Details  Name: Timothy Davenport MRN: [id number] Date of Birth: 1950-04-07 Referring Provider (PT): Juel Burrow   10th Visit Physical Therapy Progress Note  Dates of Reporting Period: 07/19/20 to 08/23/20  Encounter Date: 08/23/2020   PT End of Session - 08/23/20 1501     Visit Number 40    Number of Visits 54    Date for PT Re-Evaluation 10/18/20   60 DAY POC, 39 DAY CERT, 15 visits approved through 10/22/20   Authorization Type VA - 15 visits 02/05/20 - 06/04/20, 15 additional visits from 3/8-08/24/20, 15 additional visits through 10/22/20    Authorization - Visit Number 10    Authorization - Number of Visits 15    Progress Note Due on Visit 4    PT Start Time 1145    PT Stop Time 1230    PT Time Calculation (min) 45 min    Equipment Utilized During Treatment Other (comment)   floatation belt, pool noodle, water walker   Activity Tolerance Patient tolerated treatment well    Behavior During Therapy WFL for tasks assessed/performed             Past Medical History:  Diagnosis Date   Colon polyp    Diabetes mellitus without complication ([place])    Diabetic neuropathy ([place])    HTN (hypertension)    Patella fracture    Renal calculi     Past Surgical History:  Procedure Laterality Date   LUMBAR LAMINECTOMY/DECOMPRESSION MICRODISCECTOMY N/A 07/01/2019   Procedure: LUMBAR LAMINECTOMY/DECOMPRESSION MICRODISCECTOMY 1 LEVEL, THORACIC SIX-SEVEN;  Surgeon: Consuella Lose, MD;  Location: [facility];  Service: Neurosurgery;  Laterality: N/A;  LUMBAR LAMINECTOMY/DECOMPRESSION MICRODISCECTOMY 1 LEVEL, THORACIC SIX-SEVEN    ORIF PATELLA     THORACIC DISCECTOMY N/A 07/03/2019   Procedure: Evacuation of Thoracic Hematoma;  Surgeon: Consuella Lose, MD;  Location: [facility];   Service: Neurosurgery;  Laterality: N/A;    There were no vitals filed for this visit.   Subjective Assessment - 08/23/20 1500     Subjective Denies any falls or changes.    Pertinent History T8 paraplegia, diabetes, HTN    How long can you stand comfortably? probably a couple minutes with RW.    How long can you walk comfortably? approx. 5-10 minutes with his son and RW.    Patient Stated Goals wants to be able to walk without any assistance    Currently in Pain? No/denies             Aquatic therapy at [facility]   Patient seen for aquatic therapy today.  Treatment took place in water 3.6-4.5 feet deep depending upon activity.   Pt entered the pool via hydraulic chair lift; pt was transferred from wheelchair to/from chair lift with set up only via scoot.   Pt standing at pool edge with gait belt and holding edge with bil UE assist.  Performed bil LE squats x 10 x 2 reps.   Pt performed gait training with use of water walker for bil. UE support - pt held walker stabilized by PTA and min-mod assist.  Pt gait trained approx. 18' x 12 reps.   Pt seated on pool bench and performed bil LE LAQ x 15 reps and hip flexion x 15 reps.  Pt required AAROM for RLE.  PTA performed PROM of  R LE into knee flexion to address tone in R quad.  Needed assist at times to maintain seated position on bench due to buoyancy.  Performed same with ankle buoyancy cuffs.   Placed neck noodle, waist floatation and pool noodle on pt then assisted pt into supine position.  Performed hip abd/add x 20 x 2 sets.  Performed bil bicycling LE's with difficulty with RLE due to tone.  PTA then assisted pt with RLE hip/knee flexion for >20 reps.  Pt in same position and PTA placed bil feet against wall, pushed pt into hip/knee flexion then pt pushing into extension to propel self off wall.  Performed > 8 reps.      Pt requires buoyancy of water for support and for joint offloading; standing activities are much  safer in water with reduced fall risk compared to same activities performed on land.  Viscosity of water needed for resistance for strengthening bil. LE's.  Bouyancy supported exercises also assist with spasticity reduction.    PT Short Term Goals - 08/18/20 1110       PT SHORT TERM GOAL #1   Title Pt will perform stand pivot transfer with RW from w/c <> mat table with supervision to demo improved functional transfers. ALL STGS DUE 08/17/20    Baseline 08/18/20: close supervision    Time --   due to delay in scheduling   Status Achieved    Target Date 08/17/20      PT SHORT TERM GOAL #2   Title Pt will undergo assessment of gait speed with LTG to be written as appropriate.    Baseline 07/22/20: 0.98 ft/sec with RW/brace. Primary PT to set goal    Time --    Period --    Status Achieved      PT SHORT TERM GOAL #3   Title Pt will ambulate at least 230' consistently with RW and min guard throughout in order to demo improved household mobility.    Baseline 08/18/20; pt able to achieve distance with min guard to min assist for balance    Time --    Period --    Status Partially Met      PT SHORT TERM GOAL #4   Title Pt will decr 5x sit <> Stand time to 33 seconds or less with UE support in order to demo improved functional transfers    Baseline 08/11/20: 25.4 seconds  with therapist needing to help steady RW    Status Achieved      PT SHORT TERM GOAL #5   Title Pt will initiate aquatic therapy.    Baseline 08/18/20: met in session    Status Achieved               PT Long Term Goals - 08/11/20 1118       PT LONG TERM GOAL #1   Title Pt and spouse will be independent with final HEP for BLE stretching and strengthening. ALL LTGS DUE 09/14/20    Baseline pt will continue to benefit from ongoing additions    Time 8   due to delay in scheduling   Period Weeks    Status On-going      PT LONG TERM GOAL #2   Title Gait speed with RW goal to be written as appropriate.    Baseline not  yet assessed.    Time 8    Period Weeks    Status New      PT LONG TERM GOAL #3  Title Pt will decr 5x sit <> Stand time to 23 seconds or less with UE support and supervision in order to demo improved functional transfers    Baseline 25.4 seconds on 08/11/20 seconds with therapist needing to help steady RW    Time 8    Period Weeks    Status Revised      PT LONG TERM GOAL #4   Title --    Baseline removed goal as duplicate from LTG #3    Time --    Period --    Status --      PT LONG TERM GOAL #5   Title Pt will ambulate at least 345'' over level surfaces with RW and min guard/min A in order to demo improved household mobility and improved endurance    Baseline 230' max distance in a single bout with min guard/min A    Time 8    Period Weeks    Status New                   Plan - 08/23/20 1502     Clinical Impression Statement Skilled session focused on strength, gait, balance.  Pt with difficulty maintaining seated position on bench with LE exercises and cuffs especially with alternating LE's.  Pt continues to work hard during session with minimal rest breaks.  Cont per poc. 10th visit progress note per primary PT: Pt is making excellent progress with PT, able to perform sit <> stands from a lower mat surface with RW and min guard. Pt able to ambulate 2 bouts of 230' with a seated rest break in between initially with min guard and needing more min A when fatigued. Will continue to progress towards LTGs.    Personal Factors and Comorbidities Comorbidity 3+;Past/Current Experience    Comorbidities T8 paraplegia, diabetes, HTN    Examination-Activity Limitations Locomotion Level;Transfers;Stand    Examination-Participation Restrictions Community Activity;Yard Work   Community education officer Evolving/Moderate complexity    Rehab Potential Good    PT Frequency 2x / week   1-2x   PT Duration 8 weeks   8 weeks   PT Treatment/Interventions Aquatic  Therapy;Manual techniques;Therapeutic exercise;Gait training;Neuromuscular re-education;Orthotic Fit/Training    PT Next Visit Plan continue seated/supine strengthening. quadraped and maybe try tall kneeling. standing tolerance without UE support, gait training with RW and incr distance. SciFit.    PT Home Exercise Plan Access Code: [email or web address]    Consulted and Agree with Plan of Care Patient             Patient will benefit from skilled therapeutic intervention in order to improve the following deficits and impairments:  Decreased balance, Decreased coordination, Decreased range of motion, Decreased strength, Decreased mobility, Postural dysfunction, Impaired tone, Decreased activity tolerance, Abnormal gait, Decreased endurance, Difficulty walking, Impaired sensation  Visit Diagnosis: Difficulty in walking, not elsewhere classified  Unsteadiness on feet  Muscle weakness (generalized)  Other symptoms and signs involving the nervous system     Problem List Patient Active Problem List   Diagnosis Date Noted   Erectile dysfunction due to diseases classified elsewhere 03/02/2020   Wheelchair dependence 11/27/2019   Spasticity 08/03/2019   Paraplegia ([place]) 07/07/2019   Neurogenic bowel 07/07/2019   Neurogenic bladder 07/07/2019   Thoracic myelopathy 06/30/2019   Narda Bonds, PTA [facility] 08/23/20 3:06 PM Phone: [phone] Fax: [facility] [address] [place], [name], [address] Phone: [phone]  Fax:  [phone]  Name: TRANELL WOJTKIEWICZ MRN: [id number] Date of Birth: 1950-07-24   Janann August, PT, DPT 08/24/20 9:43 AM

## 2020-08-24 ENCOUNTER — Ambulatory Visit: Payer: No Typology Code available for payment source | Admitting: Physical Therapy

## 2020-08-24 ENCOUNTER — Encounter: Payer: Self-pay | Admitting: Physical Therapy

## 2020-08-24 DIAGNOSIS — M6281 Muscle weakness (generalized): Secondary | ICD-10-CM

## 2020-08-24 DIAGNOSIS — R29818 Other symptoms and signs involving the nervous system: Secondary | ICD-10-CM

## 2020-08-24 DIAGNOSIS — R262 Difficulty in walking, not elsewhere classified: Secondary | ICD-10-CM | POA: Diagnosis not present

## 2020-08-24 DIAGNOSIS — R2681 Unsteadiness on feet: Secondary | ICD-10-CM

## 2020-08-24 NOTE — Therapy (Signed)
[facility] [address] [place], [name], [address] Phone: [phone]   Fax:  [phone]  Physical Therapy Treatment  Patient Details  Name: Timothy Davenport MRN: [id number] Date of Birth: 1950-12-08 Referring Provider (PT): Juel Burrow    Encounter Date: 08/24/2020   PT End of Session - 08/24/20 0857     Visit Number 41    Number of Visits 101    Date for PT Re-Evaluation 10/18/20   60 DAY POC, 58 DAY CERT, 15 visits approved through 10/22/20   Authorization Type VA - 15 visits 02/05/20 - 06/04/20, 15 additional visits from 3/8-08/24/20, 15 additional visits through 10/22/20    Authorization - Visit Number 11    Authorization - Number of Visits 15    Progress Note Due on Visit 5    PT Start Time 0804    PT Stop Time 0845    PT Time Calculation (min) 41 min    Equipment Utilized During Treatment Gait belt    Activity Tolerance Patient tolerated treatment well    Behavior During Therapy WFL for tasks assessed/performed             Past Medical History:  Diagnosis Date   Colon polyp    Diabetes mellitus without complication ([place])    Diabetic neuropathy ([place])    HTN (hypertension)    Patella fracture    Renal calculi     Past Surgical History:  Procedure Laterality Date   LUMBAR LAMINECTOMY/DECOMPRESSION MICRODISCECTOMY N/A 07/01/2019   Procedure: LUMBAR LAMINECTOMY/DECOMPRESSION MICRODISCECTOMY 1 LEVEL, THORACIC SIX-SEVEN;  Surgeon: Consuella Lose, MD;  Location: [facility];  Service: Neurosurgery;  Laterality: N/A;  LUMBAR LAMINECTOMY/DECOMPRESSION MICRODISCECTOMY 1 LEVEL, THORACIC SIX-SEVEN    ORIF PATELLA     THORACIC DISCECTOMY N/A 07/03/2019   Procedure: Evacuation of Thoracic Hematoma;  Surgeon: Consuella Lose, MD;  Location: [facility];  Service: Neurosurgery;  Laterality: N/A;    There were no vitals filed for this visit.   Subjective Assessment - 08/24/20 0805     Subjective No falls or changes.     Pertinent History T8 paraplegia, diabetes, HTN    How long can you stand comfortably? probably a couple minutes with RW.    How long can you walk comfortably? approx. 5-10 minutes with his son and RW.    Patient Stated Goals wants to be able to walk without any assistance    Currently in Pain? No/denies                               [facility] Adult PT Treatment/Exercise - 08/24/20 0815       Transfers   Transfers Sit to Stand;Stand to Sit;Stand Pivot Transfers    Sit to Stand 4: Min guard;With upper extremity assist;From bed;From chair/3-in-1    Sit to Stand Details (indicate cue type and reason) from mat table with therapist helping to steady RW    Stand to Sit 4: Min guard;With upper extremity assist;To bed    Stand to Sit Details RW > mat table    Comments performed multiple reps of sit <> stands throughout session      Ambulation/Gait   Ambulation/Gait Yes    Ambulation/Gait Assistance 4: Min guard;4: Min assist    Ambulation/Gait Assistance Details cues for posture and taking his time with foot placement when going around curves. min A needed for balance during last 100', no assist for RLE foot clearance  today, did take more time clearing RLE during last lap of gait    Ambulation Distance (Feet) 230 Feet   x2   Assistive device Rolling walker    Gait Pattern Decreased weight shift to right;Left flexed knee in stance;Right flexed knee in stance;Narrow base of support;Decreased step length - right;Decreased step length - left;Decreased stance time - right;Decreased hip/knee flexion - right;Step-through pattern    Ambulation Surface Level;Indoor      Neuro Re-ed    Neuro Re-ed Details  --      Exercises   Exercises Other Exercises    Other Exercises  standing at RW at edge of mat table: without UE support 1 minute 20 seconds with min guard/min A for balance, cues for posture and glute/quad activation, with fingertip support on RW: x10 reps head turns. with BUE  support on RW: x10 reps mini squats with cues for full glute and quad activation in standing      Knee/Hip Exercises: Supine   Bridges AROM;Strengthening;Both;1 set;10 reps    Bridges Limitations cues for full ROM    Other Supine Knee/Hip Exercises alternating marching; 2 x 5 reps each side, AAROM needed for RLE esp during 2nd set, however less assist than previous sessions                      PT Short Term Goals - 08/18/20 1110       PT SHORT TERM GOAL #1   Title Pt will perform stand pivot transfer with RW from w/c <> mat table with supervision to demo improved functional transfers. ALL STGS DUE 08/17/20    Baseline 08/18/20: close supervision    Time --   due to delay in scheduling   Status Achieved    Target Date 08/17/20      PT SHORT TERM GOAL #2   Title Pt will undergo assessment of gait speed with LTG to be written as appropriate.    Baseline 07/22/20: 0.98 ft/sec with RW/brace. Primary PT to set goal    Time --    Period --    Status Achieved      PT SHORT TERM GOAL #3   Title Pt will ambulate at least 230' consistently with RW and min guard throughout in order to demo improved household mobility.    Baseline 08/18/20; pt able to achieve distance with min guard to min assist for balance    Time --    Period --    Status Partially Met      PT SHORT TERM GOAL #4   Title Pt will decr 5x sit <> Stand time to 33 seconds or less with UE support in order to demo improved functional transfers    Baseline 08/11/20: 25.4 seconds  with therapist needing to help steady RW    Status Achieved      PT SHORT TERM GOAL #5   Title Pt will initiate aquatic therapy.    Baseline 08/18/20: met in session    Status Achieved               PT Long Term Goals - 08/11/20 1118       PT LONG TERM GOAL #1   Title Pt and spouse will be independent with final HEP for BLE stretching and strengthening. ALL LTGS DUE 09/14/20    Baseline pt will continue to benefit from ongoing  additions    Time 8   due to delay in scheduling   Period Weeks  Status On-going      PT LONG TERM GOAL #2   Title Gait speed with RW goal to be written as appropriate.    Baseline not yet assessed.    Time 8    Period Weeks    Status New      PT LONG TERM GOAL #3   Title Pt will decr 5x sit <> Stand time to 23 seconds or less with UE support and supervision in order to demo improved functional transfers    Baseline 25.4 seconds on 08/11/20 seconds with therapist needing to help steady RW    Time 8    Period Weeks    Status Revised      PT LONG TERM GOAL #4   Title --    Baseline removed goal as duplicate from LTG #3    Time --    Period --    Status --      PT LONG TERM GOAL #5   Title Pt will ambulate at least 345'' over level surfaces with RW and min guard/min A in order to demo improved household mobility and improved endurance    Baseline 230' max distance in a single bout with min guard/min A    Time 8    Period Weeks    Status New                   Plan - 08/24/20 0900     Clinical Impression Statement Pt able to ambulate 2 bouts of 230' again today during session with RW, min guard for majority but more min A during last bout when more fatigued. Took incr time to clear RLE but no assist from therapist needed today. Pt able to stand at [facility] today with min guard/min A for 1 minute and 20 seconds with no UE support. Pt is making excellent progress - will continue to progress towards LTGs.    Personal Factors and Comorbidities Comorbidity 3+;Past/Current Experience    Comorbidities T8 paraplegia, diabetes, HTN    Examination-Activity Limitations Locomotion Level;Transfers;Stand    Examination-Participation Restrictions Community Activity;Yard Work   Community education officer Evolving/Moderate complexity    Rehab Potential Good    PT Frequency 2x / week   1-2x   PT Duration 8 weeks   8 weeks   PT Treatment/Interventions Aquatic  Therapy;Manual techniques;Therapeutic exercise;Gait training;Neuromuscular re-education;Orthotic Fit/Training    PT Next Visit Plan continue seated/supine strengthening. quadraped and maybe try tall kneeling. standing tolerance without UE support, gait training with RW and incr distance. SciFit.    PT Home Exercise Plan Access Code: [email or web address]    Consulted and Agree with Plan of Care Patient             Patient will benefit from skilled therapeutic intervention in order to improve the following deficits and impairments:  Decreased balance, Decreased coordination, Decreased range of motion, Decreased strength, Decreased mobility, Postural dysfunction, Impaired tone, Decreased activity tolerance, Abnormal gait, Decreased endurance, Difficulty walking, Impaired sensation  Visit Diagnosis: Difficulty in walking, not elsewhere classified  Unsteadiness on feet  Muscle weakness (generalized)  Other symptoms and signs involving the nervous system     Problem List Patient Active Problem List   Diagnosis Date Noted   Erectile dysfunction due to diseases classified elsewhere 03/02/2020   Wheelchair dependence 11/27/2019   Spasticity 08/03/2019   Paraplegia ([place]) 07/07/2019   Neurogenic bowel 07/07/2019   Neurogenic bladder 07/07/2019   Thoracic myelopathy 06/30/2019    Digby Groeneveld N  Rodney Booze, PT, DPT  08/24/2020, 9:02 AM  [facility] [address] [place], [name], [address] Phone: [phone]   Fax:  [phone]  Name: Timothy Davenport MRN: [id number] Date of Birth: [birth year]

## 2020-08-29 ENCOUNTER — Other Ambulatory Visit: Payer: Self-pay

## 2020-08-29 ENCOUNTER — Encounter: Payer: Self-pay | Admitting: Physical Therapy

## 2020-08-29 ENCOUNTER — Ambulatory Visit: Payer: BC Managed Care – PPO | Admitting: Physical Therapy

## 2020-08-29 DIAGNOSIS — R262 Difficulty in walking, not elsewhere classified: Secondary | ICD-10-CM

## 2020-08-29 DIAGNOSIS — R29818 Other symptoms and signs involving the nervous system: Secondary | ICD-10-CM

## 2020-08-29 DIAGNOSIS — M6281 Muscle weakness (generalized): Secondary | ICD-10-CM

## 2020-08-29 DIAGNOSIS — R2681 Unsteadiness on feet: Secondary | ICD-10-CM

## 2020-08-29 NOTE — Therapy (Signed)
[facility] [address] [place], [name], [address] Phone: [phone]   Fax:  [phone]  Physical Therapy Treatment  Patient Details  Name: Timothy Davenport MRN: [id number] Date of Birth: Jul 06, 1950 Referring Provider (PT): Juel Burrow    Encounter Date: 08/29/2020   PT End of Session - 08/29/20 1418     Visit Number 42    Number of Visits 46    Date for PT Re-Evaluation 10/18/20   60 DAY POC, 90 DAY CERT, 15 visits approved through 10/22/20   Authorization Type VA - 15 visits 02/05/20 - 06/04/20, 15 additional visits from 3/8-08/24/20, 15 additional visits through 10/22/20    Authorization - Visit Number 12    Authorization - Number of Visits 15    Progress Note Due on Visit 63    PT Start Time 3295   pt arrived late due to transportation late to pick up   PT Stop Time 1315    PT Time Calculation (min) 30 min    Equipment Utilized During Treatment Gait belt;Other (comment)   waist floatation belt, large barbell/noodle   Activity Tolerance Patient tolerated treatment well    Behavior During Therapy WFL for tasks assessed/performed             Past Medical History:  Diagnosis Date   Colon polyp    Diabetes mellitus without complication ([facility])    Diabetic neuropathy ([facility])    HTN (hypertension)    Patella fracture    Renal calculi     Past Surgical History:  Procedure Laterality Date   LUMBAR LAMINECTOMY/DECOMPRESSION MICRODISCECTOMY N/A 07/01/2019   Procedure: LUMBAR LAMINECTOMY/DECOMPRESSION MICRODISCECTOMY 1 LEVEL, THORACIC SIX-SEVEN;  Surgeon: Consuella Lose, MD;  Location: [facility];  Service: Neurosurgery;  Laterality: N/A;  LUMBAR LAMINECTOMY/DECOMPRESSION MICRODISCECTOMY 1 LEVEL, THORACIC SIX-SEVEN    ORIF PATELLA     THORACIC DISCECTOMY N/A 07/03/2019   Procedure: Evacuation of Thoracic Hematoma;  Surgeon: Consuella Lose, MD;  Location: [facility];  Service: Neurosurgery;  Laterality: N/A;    There  were no vitals filed for this visit.   Subjective Assessment - 08/29/20 1416     Subjective Pt arrived 15 minutes late for appointment due to transportation not picking pt up on time.  Denies any falls or changes.    Pertinent History T8 paraplegia, diabetes, HTN    How long can you stand comfortably? probably a couple minutes with RW.    How long can you walk comfortably? approx. 5-10 minutes with his son and RW.    Patient Stated Goals wants to be able to walk without any assistance    Currently in Pain? No/denies            Aquatic therapy at [facility]   Patient seen for aquatic therapy today.  Treatment took place in water 3.6-4.5 feet deep depending upon activity.   Pt entered the pool via hydraulic chair lift; pt was transferred from wheelchair to/from chair lift with set up only via scoot.   Pt standing at pool edge with gait belt and holding edge with bil UE assist.  Performed bil LE squats x 15 x 2 reps.   Pt performed gait training with use of long barbell for bil. UE support - pt held barbell stabilized by PTA and min-mod assist.  Pt gait trained approx. 18' x 12 reps.     Pt requires buoyancy of water for support and for joint offloading; standing activities are much safer in water  with reduced fall risk compared to same activities performed on land.  Viscosity of water needed for resistance for strengthening bil. LE's.  Bouyancy supported exercises also assist with spasticity reduction.     Hip extension AAROM with pt facing pool wall with UE support to bil hips x 5 reps.  Standing x 1 minute with intermittent UE support of wall and using UE's to assist with balance. Pt tends to be forward flexed at hips.     PT Short Term Goals - 08/18/20 1110       PT SHORT TERM GOAL #1   Title Pt will perform stand pivot transfer with RW from w/c <> mat table with supervision to demo improved functional transfers. ALL STGS DUE 08/17/20    Baseline 08/18/20: close  supervision    Time --   due to delay in scheduling   Status Achieved    Target Date 08/17/20      PT SHORT TERM GOAL #2   Title Pt will undergo assessment of gait speed with LTG to be written as appropriate.    Baseline 07/22/20: 0.98 ft/sec with RW/brace. Primary PT to set goal    Time --    Period --    Status Achieved      PT SHORT TERM GOAL #3   Title Pt will ambulate at least 230' consistently with RW and min guard throughout in order to demo improved household mobility.    Baseline 08/18/20; pt able to achieve distance with min guard to min assist for balance    Time --    Period --    Status Partially Met      PT SHORT TERM GOAL #4   Title Pt will decr 5x sit <> Stand time to 33 seconds or less with UE support in order to demo improved functional transfers    Baseline 08/11/20: 25.4 seconds  with therapist needing to help steady RW    Status Achieved      PT SHORT TERM GOAL #5   Title Pt will initiate aquatic therapy.    Baseline 08/18/20: met in session    Status Achieved               PT Long Term Goals - 08/11/20 1118       PT LONG TERM GOAL #1   Title Pt and spouse will be independent with final HEP for BLE stretching and strengthening. ALL LTGS DUE 09/14/20    Baseline pt will continue to benefit from ongoing additions    Time 8   due to delay in scheduling   Period Weeks    Status On-going      PT LONG TERM GOAL #2   Title Gait speed with RW goal to be written as appropriate.    Baseline not yet assessed.    Time 8    Period Weeks    Status New      PT LONG TERM GOAL #3   Title Pt will decr 5x sit <> Stand time to 23 seconds or less with UE support and supervision in order to demo improved functional transfers    Baseline 25.4 seconds on 08/11/20 seconds with therapist needing to help steady RW    Time 8    Period Weeks    Status Revised      PT LONG TERM GOAL #4   Title --    Baseline removed goal as duplicate from LTG #3    Time --  Period --     Status --      PT LONG TERM GOAL #5   Title Pt will ambulate at least 345'' over level surfaces with RW and min guard/min A in order to demo improved household mobility and improved endurance    Baseline 230' max distance in a single bout with min guard/min A    Time 8    Period Weeks    Status New                   Plan - 08/29/20 1419     Clinical Impression Statement Session continues to focus on strength, gait, balance, ROM.  Worked on standing with decreased UE support.  Stretching to tight bil hip flexors today.  Pt reports feeling like he is getting stronger and attributes this to aquatic sessions.  Cont per poc.    Personal Factors and Comorbidities Comorbidity 3+;Past/Current Experience    Comorbidities T8 paraplegia, diabetes, HTN    Examination-Activity Limitations Locomotion Level;Transfers;Stand    Examination-Participation Restrictions Community Activity;Yard Work   Community education officer Evolving/Moderate complexity    Rehab Potential Good    PT Frequency 2x / week   1-2x   PT Duration 8 weeks   8 weeks   PT Treatment/Interventions Aquatic Therapy;Manual techniques;Therapeutic exercise;Gait training;Neuromuscular re-education;Orthotic Fit/Training    PT Next Visit Plan continue seated/supine strengthening. quadraped and maybe try tall kneeling. standing tolerance without UE support, gait training with RW and incr distance. SciFit.    PT Home Exercise Plan Access Code: [email or web address]    Consulted and Agree with Plan of Care Patient             Patient will benefit from skilled therapeutic intervention in order to improve the following deficits and impairments:  Decreased balance, Decreased coordination, Decreased range of motion, Decreased strength, Decreased mobility, Postural dysfunction, Impaired tone, Decreased activity tolerance, Abnormal gait, Decreased endurance, Difficulty walking, Impaired sensation  Visit Diagnosis: Difficulty  in walking, not elsewhere classified  Unsteadiness on feet  Muscle weakness (generalized)  Other symptoms and signs involving the nervous system     Problem List Patient Active Problem List   Diagnosis Date Noted   Erectile dysfunction due to diseases classified elsewhere 03/02/2020   Wheelchair dependence 11/27/2019   Spasticity 08/03/2019   Paraplegia ([place]) 07/07/2019   Neurogenic bowel 07/07/2019   Neurogenic bladder 07/07/2019   Thoracic myelopathy 06/30/2019   Narda Bonds, PTA [facility] 08/29/20 2:25 PM Phone: [phone] Fax: [facility] [address] [place], [name], [address] Phone: [phone]   Fax:  [phone]  Name: Timothy Davenport MRN: [id number] Date of Birth: [birth year]

## 2020-09-01 ENCOUNTER — Other Ambulatory Visit: Payer: Self-pay

## 2020-09-01 ENCOUNTER — Ambulatory Visit: Payer: No Typology Code available for payment source | Admitting: Physical Therapy

## 2020-09-01 ENCOUNTER — Encounter: Payer: Self-pay | Admitting: Physical Therapy

## 2020-09-01 DIAGNOSIS — M6281 Muscle weakness (generalized): Secondary | ICD-10-CM

## 2020-09-01 DIAGNOSIS — R262 Difficulty in walking, not elsewhere classified: Secondary | ICD-10-CM | POA: Diagnosis not present

## 2020-09-01 DIAGNOSIS — R2681 Unsteadiness on feet: Secondary | ICD-10-CM

## 2020-09-01 NOTE — Therapy (Signed)
[facility] [address] [place], [name], [address] Phone: [phone]   Fax:  [phone]  Physical Therapy Treatment  Patient Details  Name: Timothy Davenport MRN: [id number] Date of Birth: 1950/02/23 Referring Provider (PT): Juel Burrow    Encounter Date: 09/01/2020   PT End of Session - 09/01/20 1204     Visit Number 43    Number of Visits 74    Date for PT Re-Evaluation 10/18/20   60 DAY POC, 31 DAY CERT, 15 visits approved through 10/22/20   Authorization Type [facility] - 15 visits 02/05/20 - 06/04/20, 15 additional visits from 3/8-08/24/20, 15 additional visits through 10/22/20    Authorization - Visit Number 12    Authorization - Number of Visits 15    Progress Note Due on Visit 76    PT Start Time 1105    PT Stop Time 1148    PT Time Calculation (min) 43 min    Equipment Utilized During Treatment Gait belt    Activity Tolerance Patient tolerated treatment well    Behavior During Therapy WFL for tasks assessed/performed             Past Medical History:  Diagnosis Date   Colon polyp    Diabetes mellitus without complication ([place])    Diabetic neuropathy ([place])    HTN (hypertension)    Patella fracture    Renal calculi     Past Surgical History:  Procedure Laterality Date   LUMBAR LAMINECTOMY/DECOMPRESSION MICRODISCECTOMY N/A 07/01/2019   Procedure: LUMBAR LAMINECTOMY/DECOMPRESSION MICRODISCECTOMY 1 LEVEL, THORACIC SIX-SEVEN;  Surgeon: Consuella Lose, MD;  Location: [facility];  Service: Neurosurgery;  Laterality: N/A;  LUMBAR LAMINECTOMY/DECOMPRESSION MICRODISCECTOMY 1 LEVEL, THORACIC SIX-SEVEN    ORIF PATELLA     THORACIC DISCECTOMY N/A 07/03/2019   Procedure: Evacuation of Thoracic Hematoma;  Surgeon: Consuella Lose, MD;  Location: [facility];  Service: Neurosurgery;  Laterality: N/A;    There were no vitals filed for this visit.   Subjective Assessment - 09/01/20 1108     Subjective Bumped his knee into  a wall the other day when in his power w/c. Walking is going well at home.    Pertinent History T8 paraplegia, diabetes, HTN    How long can you stand comfortably? probably a couple minutes with RW.    How long can you walk comfortably? approx. 5-10 minutes with his son and RW.    Patient Stated Goals wants to be able to walk without any assistance    Currently in Pain? No/denies                               [facility] Adult PT Treatment/Exercise - 09/01/20 1109       Transfers   Transfers Sit to Stand;Stand to [employer] Transfers    Sit to Stand 4: Min guard;With upper extremity assist;From bed;From chair/3-in-1    Sit to Stand Details (indicate cue type and reason) from mat table, therapist helping to steady RW    Stand to Sit 4: Min guard;With upper extremity assist;To bed    Comments performed approx. x5 reps throughout session      Ambulation/Gait   Ambulation/Gait Yes    Ambulation/Gait Assistance 4: Min guard;4: Min assist    Ambulation/Gait Assistance Details performed x1 lap at start of session prior to stretching, cues for posture and R foot placement, performed an additional x2 laps at end of session, pt  with 2 episodes of getting L foot caught behind R but pt able to self correct on his own. cues to take his time around curves and for wider BOS    Ambulation Distance (Feet) 115 Feet   x1, 230' x 2   Assistive device Rolling walker    Gait Pattern Decreased weight shift to right;Left flexed knee in stance;Right flexed knee in stance;Narrow base of support;Decreased step length - right;Decreased step length - left;Decreased stance time - right;Decreased hip/knee flexion - right;Step-through pattern    Ambulation Surface Level;Indoor      Exercises   Exercises Other Exercises    Other Exercises  standing at RW: x15 reps mini squats with BUE support, cues for knee extension and glute activation/posture once in standing      Knee/Hip Exercises: Stretches    Passive Hamstring Stretch Both;2 reps;30 seconds    Passive Hamstring Stretch Limitations seated at edge of mat table, cues for technique and hold time    Other Knee/Hip Stretches pt prone on mat table with pillow under pelvis, performed 3 x 45 seconds hip flexor/quad stretch with muscle energy technique. discussed at home as a means of stretching out hip flexors to lay on stomach (discussed this previously but pt reports has not yet been performing at home)    Other Knee/Hip Stretches seated in long sitting; hamstring stretch with pt sitting up with tall posture and walking hands forwards 2 x 45 seconds, then with legs abducted for hip ADD stretch 2 x 45 seconds - pt to also perform these stretches at home                    PT Education - 09/01/20 1204     Education Details stretches to perform at home.    Person(s) Educated Patient    Methods Explanation;Demonstration    Comprehension Verbalized understanding;Returned demonstration              PT Short Term Goals - 08/18/20 1110       PT SHORT TERM GOAL #1   Title Pt will perform stand pivot transfer with RW from w/c <> mat table with supervision to demo improved functional transfers. ALL STGS DUE 08/17/20    Baseline 08/18/20: close supervision    Time --   due to delay in scheduling   Status Achieved    Target Date 08/17/20      PT SHORT TERM GOAL #2   Title Pt will undergo assessment of gait speed with LTG to be written as appropriate.    Baseline 07/22/20: 0.98 ft/sec with RW/brace. Primary PT to set goal    Time --    Period --    Status Achieved      PT SHORT TERM GOAL #3   Title Pt will ambulate at least 230' consistently with RW and min guard throughout in order to demo improved household mobility.    Baseline 08/18/20; pt able to achieve distance with min guard to min assist for balance    Time --    Period --    Status Partially Met      PT SHORT TERM GOAL #4   Title Pt will decr 5x sit <> Stand time  to 33 seconds or less with UE support in order to demo improved functional transfers    Baseline 08/11/20: 25.4 seconds  with therapist needing to help steady RW    Status Achieved      PT SHORT TERM GOAL #5  Title Pt will initiate aquatic therapy.    Baseline 08/18/20: met in session    Status Achieved               PT Long Term Goals - 08/11/20 1118       PT LONG TERM GOAL #1   Title Pt and spouse will be independent with final HEP for BLE stretching and strengthening. ALL LTGS DUE 09/14/20    Baseline pt will continue to benefit from ongoing additions    Time 8   due to delay in scheduling   Period Weeks    Status On-going      PT LONG TERM GOAL #2   Title Gait speed with RW goal to be written as appropriate.    Baseline not yet assessed.    Time 8    Period Weeks    Status New      PT LONG TERM GOAL #3   Title Pt will decr 5x sit <> Stand time to 23 seconds or less with UE support and supervision in order to demo improved functional transfers    Baseline 25.4 seconds on 08/11/20 seconds with therapist needing to help steady RW    Time 8    Period Weeks    Status Revised      PT LONG TERM GOAL #4   Title --    Baseline removed goal as duplicate from LTG #3    Time --    Period --    Status --      PT LONG TERM GOAL #5   Title Pt will ambulate at least 345'' over level surfaces with RW and min guard/min A in order to demo improved household mobility and improved endurance    Baseline 230' max distance in a single bout with min guard/min A    Time 8    Period Weeks    Status New                   Plan - 09/01/20 1309     Clinical Impression Statement Beginning of session focused on stretching to hamstrings, hip ADD, and hip flexors prior to gait. Reviewed stretches for pt to perform at home and importance of performing frequently for ROM (pt reports only doing 1x per week). Remainder of session focused on gait training with min guard/min A. Pt with a  few instances of getting LLE stuck behind R due to narrow BOS - but able to correct on his own.    Personal Factors and Comorbidities Comorbidity 3+;Past/Current Experience    Comorbidities T8 paraplegia, diabetes, HTN    Examination-Activity Limitations Locomotion Level;Transfers;Stand    Examination-Participation Restrictions Community Activity;Yard Work   Community education officer Evolving/Moderate complexity    Rehab Potential Good    PT Frequency 2x / week   1-2x   PT Duration 8 weeks   8 weeks   PT Treatment/Interventions Aquatic Therapy;Manual techniques;Therapeutic exercise;Gait training;Neuromuscular re-education;Orthotic Fit/Training    PT Next Visit Plan check LTGs next week - recert will be do. Reem Fleury requested more [facility] visits,continue seated/supine strengthening. quadraped and maybe try tall kneeling when ryan is here. standing tolerance without UE support, gait training with RW and incr distance. SciFit.    PT Home Exercise Plan Access Code: [email or web address]    Consulted and Agree with Plan of Care Patient             Patient will benefit from skilled therapeutic intervention in order to improve  the following deficits and impairments:  Decreased balance, Decreased coordination, Decreased range of motion, Decreased strength, Decreased mobility, Postural dysfunction, Impaired tone, Decreased activity tolerance, Abnormal gait, Decreased endurance, Difficulty walking, Impaired sensation  Visit Diagnosis: Difficulty in walking, not elsewhere classified  Unsteadiness on feet  Muscle weakness (generalized)     Problem List Patient Active Problem List   Diagnosis Date Noted   Erectile dysfunction due to diseases classified elsewhere 03/02/2020   Wheelchair dependence 11/27/2019   Spasticity 08/03/2019   Paraplegia ([place]) 07/07/2019   Neurogenic bowel 07/07/2019   Neurogenic bladder 07/07/2019   Thoracic myelopathy 06/30/2019    Arliss Journey, PT, DPT   09/01/2020, 1:11 PM  [facility] [address] [place], [name], [address] Phone: [phone]   Fax:  [phone]  Name: LOVETT COFFIN MRN: [id number] Date of Birth: [birth year]

## 2020-09-05 ENCOUNTER — Other Ambulatory Visit: Payer: Self-pay

## 2020-09-05 ENCOUNTER — Ambulatory Visit: Payer: No Typology Code available for payment source | Admitting: Physical Therapy

## 2020-09-05 ENCOUNTER — Encounter: Payer: Self-pay | Admitting: Physical Therapy

## 2020-09-05 DIAGNOSIS — R262 Difficulty in walking, not elsewhere classified: Secondary | ICD-10-CM

## 2020-09-05 DIAGNOSIS — M6281 Muscle weakness (generalized): Secondary | ICD-10-CM

## 2020-09-05 DIAGNOSIS — R2681 Unsteadiness on feet: Secondary | ICD-10-CM

## 2020-09-05 DIAGNOSIS — R29818 Other symptoms and signs involving the nervous system: Secondary | ICD-10-CM

## 2020-09-05 NOTE — Therapy (Signed)
[facility] [address] [place], [name], [address] Phone: [phone]   Fax:  [phone]  Physical Therapy Treatment  Patient Details  Name: Timothy Davenport MRN: [id number] Date of Birth: 10/02/1950 Referring Provider (PT): Juel Burrow    Encounter Date: 09/05/2020   PT End of Session - 09/05/20 1544     Visit Number 44    Number of Visits 46    Date for PT Re-Evaluation 10/18/20   60 DAY POC, 90 DAY CERT, 15 visits approved through 10/22/20   Authorization Type VA - 15 visits 02/05/20 - 06/04/20, 15 additional visits from 3/8-08/24/20, 15 additional visits through 10/22/20    Authorization - Visit Number 13    Authorization - Number of Visits 15    Progress Note Due on Visit 40    PT Start Time 1230    PT Stop Time 1315    PT Time Calculation (min) 45 min    Equipment Utilized During Treatment Gait belt;Other (comment)   buoyancy barbell, floatation belt, ankle weight   Activity Tolerance Patient tolerated treatment well    Behavior During Therapy WFL for tasks assessed/performed             Past Medical History:  Diagnosis Date   Colon polyp    Diabetes mellitus without complication ([facility])    Diabetic neuropathy ([facility])    HTN (hypertension)    Patella fracture    Renal calculi     Past Surgical History:  Procedure Laterality Date   LUMBAR LAMINECTOMY/DECOMPRESSION MICRODISCECTOMY N/A 07/01/2019   Procedure: LUMBAR LAMINECTOMY/DECOMPRESSION MICRODISCECTOMY 1 LEVEL, THORACIC SIX-SEVEN;  Surgeon: Consuella Lose, MD;  Location: [facility];  Service: Neurosurgery;  Laterality: N/A;  LUMBAR LAMINECTOMY/DECOMPRESSION MICRODISCECTOMY 1 LEVEL, THORACIC SIX-SEVEN    ORIF PATELLA     THORACIC DISCECTOMY N/A 07/03/2019   Procedure: Evacuation of Thoracic Hematoma;  Surgeon: Consuella Lose, MD;  Location: [facility];  Service: Neurosurgery;  Laterality: N/A;    There were no vitals filed for this visit.   Subjective  Assessment - 09/05/20 1543     Subjective Denies any changes.  Still unable to get his seatbelt for his power wheelchair.    Pertinent History T8 paraplegia, diabetes, HTN    How long can you stand comfortably? probably a couple minutes with RW.    How long can you walk comfortably? approx. 5-10 minutes with his son and RW.    Patient Stated Goals wants to be able to walk without any assistance    Currently in Pain? No/denies            Aquatic therapy at [facility]   Patient seen for aquatic therapy today.  Treatment took place in water 3.6-4.5 feet deep depending upon activity.   Pt entered the pool via hydraulic chair lift; pt was transferred from wheelchair to/from chair lift with set up only via scoot.   Pt standing at pool edge with gait belt and holding edge with bil UE assist.  Performed bil LE squats x 15 x 2 reps.   Pt performed gait training with use of long barbell for bil. UE support - pt held barbell stabilized by PTA and min-mod assist.  Pt gait trained approx. 18' x 12 reps.   Pt in supine position with neck noodle, waist floatation and pool noodle.  Hip abd/add x 15 reps x 2, bicycling LE's x 20 x 2.  PTA pushing pt into hip and knee flexion with bil feet  on pool wall and pt pushing off into hip and knee extension x 10 reps.  Seated on pool bench for bil hip flexion and LAQ x 10 reps then repeated x 10 reps with ankle weight.  10 reps of RLE hamstring curl.   Pt requires buoyancy of water for support and for joint offloading; standing activities are much safer in water with reduced fall risk compared to same activities performed on land.  Viscosity of water needed for resistance for strengthening bil. LE's.  Bouyancy supported exercises also assist with spasticity reduction.     PT Short Term Goals - 08/18/20 1110       PT SHORT TERM GOAL #1   Title Pt will perform stand pivot transfer with RW from w/c <> mat table with supervision to demo improved  functional transfers. ALL STGS DUE 08/17/20    Baseline 08/18/20: close supervision    Time --   due to delay in scheduling   Status Achieved    Target Date 08/17/20      PT SHORT TERM GOAL #2   Title Pt will undergo assessment of gait speed with LTG to be written as appropriate.    Baseline 07/22/20: 0.98 ft/sec with RW/brace. Primary PT to set goal    Time --    Period --    Status Achieved      PT SHORT TERM GOAL #3   Title Pt will ambulate at least 230' consistently with RW and min guard throughout in order to demo improved household mobility.    Baseline 08/18/20; pt able to achieve distance with min guard to min assist for balance    Time --    Period --    Status Partially Met      PT SHORT TERM GOAL #4   Title Pt will decr 5x sit <> Stand time to 33 seconds or less with UE support in order to demo improved functional transfers    Baseline 08/11/20: 25.4 seconds  with therapist needing to help steady RW    Status Achieved      PT SHORT TERM GOAL #5   Title Pt will initiate aquatic therapy.    Baseline 08/18/20: met in session    Status Achieved               PT Long Term Goals - 08/11/20 1118       PT LONG TERM GOAL #1   Title Pt and spouse will be independent with final HEP for BLE stretching and strengthening. ALL LTGS DUE 09/14/20    Baseline pt will continue to benefit from ongoing additions    Time 8   due to delay in scheduling   Period Weeks    Status On-going      PT LONG TERM GOAL #2   Title Gait speed with RW goal to be written as appropriate.    Baseline not yet assessed.    Time 8    Period Weeks    Status New      PT LONG TERM GOAL #3   Title Pt will decr 5x sit <> Stand time to 23 seconds or less with UE support and supervision in order to demo improved functional transfers    Baseline 25.4 seconds on 08/11/20 seconds with therapist needing to help steady RW    Time 8    Period Weeks    Status Revised      PT LONG TERM GOAL #4   Title --  Baseline removed goal as duplicate from LTG #3    Time --    Period --    Status --      PT LONG TERM GOAL #5   Title Pt will ambulate at least 345'' over level surfaces with RW and min guard/min A in order to demo improved household mobility and improved endurance    Baseline 230' max distance in a single bout with min guard/min A    Time 8    Period Weeks    Status New                   Plan - 09/05/20 1545     Clinical Impression Statement Skilled aquatic session focused on gait, balance, strength and ROM.  Pt's strength gradually improving as pt requiring decreased amount of UE assist with gait/activities.  Cont per poc.    Personal Factors and Comorbidities Comorbidity 3+;Past/Current Experience    Comorbidities T8 paraplegia, diabetes, HTN    Examination-Activity Limitations Locomotion Level;Transfers;Stand    Examination-Participation Restrictions Community Activity;Yard Work   Community education officer Evolving/Moderate complexity    Rehab Potential Good    PT Frequency 2x / week   1-2x   PT Duration 8 weeks   8 weeks   PT Treatment/Interventions Aquatic Therapy;Manual techniques;Therapeutic exercise;Gait training;Neuromuscular re-education;Orthotic Fit/Training    PT Next Visit Plan check LTGs next week - recert will be do. chloe requested more [facility] visits,continue seated/supine strengthening. quadraped and maybe try tall kneeling when ryan is here. standing tolerance without UE support, gait training with RW and incr distance. Sci[employer].    PT Home Exercise Plan Access Code: [email or web address]    Consulted and Agree with Plan of Care Patient             Patient will benefit from skilled therapeutic intervention in order to improve the following deficits and impairments:  Decreased balance, Decreased coordination, Decreased range of motion, Decreased strength, Decreased mobility, Postural dysfunction, Impaired tone, Decreased activity tolerance, Abnormal  gait, Decreased endurance, Difficulty walking, Impaired sensation  Visit Diagnosis: Difficulty in walking, not elsewhere classified  Unsteadiness on feet  Muscle weakness (generalized)  Other symptoms and signs involving the nervous system     Problem List Patient Active Problem List   Diagnosis Date Noted   Erectile dysfunction due to diseases classified elsewhere 03/02/2020   Wheelchair dependence 11/27/2019   Spasticity 08/03/2019   Paraplegia ([place]) 07/07/2019   Neurogenic bowel 07/07/2019   Neurogenic bladder 07/07/2019   Thoracic myelopathy 06/30/2019   Narda Bonds, PTA [facility] 09/05/20 3:52 PM Phone: [phone] Fax: [facility] [address] [place], [name], [address] Phone: [phone]   Fax:  [phone]  Name: KEEVON HENNEY MRN: [id number] Date of Birth: [birth year]

## 2020-09-08 ENCOUNTER — Ambulatory Visit: Payer: No Typology Code available for payment source | Admitting: Physical Therapy

## 2020-09-08 ENCOUNTER — Encounter: Payer: Self-pay | Admitting: Physical Therapy

## 2020-09-08 ENCOUNTER — Other Ambulatory Visit: Payer: Self-pay

## 2020-09-08 DIAGNOSIS — R293 Abnormal posture: Secondary | ICD-10-CM

## 2020-09-08 DIAGNOSIS — R29818 Other symptoms and signs involving the nervous system: Secondary | ICD-10-CM

## 2020-09-08 DIAGNOSIS — M6281 Muscle weakness (generalized): Secondary | ICD-10-CM

## 2020-09-08 DIAGNOSIS — R262 Difficulty in walking, not elsewhere classified: Secondary | ICD-10-CM | POA: Diagnosis not present

## 2020-09-08 DIAGNOSIS — R2681 Unsteadiness on feet: Secondary | ICD-10-CM

## 2020-09-08 NOTE — Therapy (Signed)
[facility] [address] [place], [name], [address] Phone: [phone]   Fax:  [phone]  Physical Therapy Treatment  Patient Details  Name: Timothy Davenport MRN: [id number] Date of Birth: 1950-03-05 Referring Provider (PT): Juel Burrow    Encounter Date: 09/08/2020   PT End of Session - 09/08/20 1103     Visit Number 45    Number of Visits 46    Date for PT Re-Evaluation 10/18/20   60 DAY POC, 90 DAY CERT, 15 visits approved through 10/22/20   Authorization Type VA - 15 visits 02/05/20 - 06/04/20, 15 additional visits from 3/8-08/24/20, 15 additional visits through 10/22/20    Authorization - Visit Number 14    Authorization - Number of Visits 15    Progress Note Due on Visit 40    PT Start Time 1101    PT Stop Time 1145    PT Time Calculation (min) 44 min    Equipment Utilized During Treatment Gait belt;Other (comment)   right AFO   Activity Tolerance Patient tolerated treatment well    Behavior During Therapy WFL for tasks assessed/performed             Past Medical History:  Diagnosis Date   Colon polyp    Diabetes mellitus without complication (HCC)    Diabetic neuropathy (HCC)    HTN (hypertension)    Patella fracture    Renal calculi     Past Surgical History:  Procedure Laterality Date   LUMBAR LAMINECTOMY/DECOMPRESSION MICRODISCECTOMY N/A 07/01/2019   Procedure: LUMBAR LAMINECTOMY/DECOMPRESSION MICRODISCECTOMY 1 LEVEL, THORACIC SIX-SEVEN;  Surgeon: Consuella Lose, MD;  Location: [facility];  Service: Neurosurgery;  Laterality: N/A;  LUMBAR LAMINECTOMY/DECOMPRESSION MICRODISCECTOMY 1 LEVEL, THORACIC SIX-SEVEN    ORIF PATELLA     THORACIC DISCECTOMY N/A 07/03/2019   Procedure: Evacuation of Thoracic Hematoma;  Surgeon: Consuella Lose, MD;  Location: [facility];  Service: Neurosurgery;  Laterality: N/A;    There were no vitals filed for this visit.   Subjective Assessment - 09/08/20 1101      Subjective No new complaints. No falls or pain. Spoke to vendor about power chair today and seatbelt was just ordered today "hopefully" as it had not been ordered to date due to a mix up on their end.    Pertinent History T8 paraplegia, diabetes, HTN    How long can you stand comfortably? probably a couple minutes with RW.    Patient Stated Goals wants to be able to walk without any assistance    Currently in Pain? No/denies    Pain Score 0-No pain                      [facility] Adult PT Treatment/Exercise - 09/08/20 1104       Transfers   Transfers Sit to Stand;Stand to Sit    Sit to Stand 4: Min guard;With upper extremity assist;From bed;From chair/3-in-1    Sit to Stand Details (indicate cue type and reason) for high/low table in lowest place to RW with assist to stabilize walker only    Stand to Sit 4: Min guard;With upper extremity assist;To bed    Stand to Sit Details RW> mat table with pt able to use arms to control descent      Ambulation/Gait   Ambulation/Gait Yes    Ambulation/Gait Assistance 4: Min guard;4: Min assist;3: Mod assist    Ambulation/Gait Assistance Details with 1st lap right knee buckling or last ~50 feet  needing mod assist at times to correct, pt able to complete the second lap to complete the 230 feet. with 2cd lap right knee buckling last ~20 feet with pt self correcting with min guard to min assist.    Ambulation Distance (Feet) 230 Feet   x1, 115 x1   Assistive device Rolling walker    Gait Pattern Decreased weight shift to right;Left flexed knee in stance;Right flexed knee in stance;Narrow base of support;Decreased step length - right;Decreased step length - left;Decreased stance time - right;Decreased hip/knee flexion - right;Step-through pattern    Ambulation Surface Level;Indoor      Knee/Hip Exercises: Seated   Long Arc Quad AROM;Strengthening;Both;1 set;10 reps;Limitations    Long Arc Quad Limitations red band resistance to bil LE's with pt  moving through full range, assist needed for full ROM with RLE    Hamstring Curl AROM;Strengthening;Both;10 reps;Limitations;1 set    Hamstring Limitations with foot on towel and red band resistance for bil LE, assist to stabilize foot when pt was sliding foot back      Knee/Hip Exercises: Prone   Hamstring Curl 2 sets;10 reps;Limitations    Hamstring Curl Limitations increased assist needed with right>left LE with visable muscle tremors from fatigue as reps progressed    Other Prone Exercises glut kick backs with assist to maintain knee flexion, overpressure to keep pelvis down and assist to lift leg off mat for 2 sets of 10 reps each side                      PT Short Term Goals - 08/18/20 1110       PT SHORT TERM GOAL #1   Title Pt will perform stand pivot transfer with RW from w/c <> mat table with supervision to demo improved functional transfers. ALL STGS DUE 08/17/20    Baseline 08/18/20: close supervision    Time --   due to delay in scheduling   Status Achieved    Target Date 08/17/20      PT SHORT TERM GOAL #2   Title Pt will undergo assessment of gait speed with LTG to be written as appropriate.    Baseline 07/22/20: 0.98 ft/sec with RW/brace. Primary PT to set goal    Time --    Period --    Status Achieved      PT SHORT TERM GOAL #3   Title Pt will ambulate at least 230' consistently with RW and min guard throughout in order to demo improved household mobility.    Baseline 08/18/20; pt able to achieve distance with min guard to min assist for balance    Time --    Period --    Status Partially Met      PT SHORT TERM GOAL #4   Title Pt will decr 5x sit <> Stand time to 33 seconds or less with UE support in order to demo improved functional transfers    Baseline 08/11/20: 25.4 seconds  with therapist needing to help steady RW    Status Achieved      PT SHORT TERM GOAL #5   Title Pt will initiate aquatic therapy.    Baseline 08/18/20: met in session    Status  Achieved               PT Long Term Goals - 08/11/20 1118       PT LONG TERM GOAL #1   Title Pt and spouse will be independent with final HEP for  BLE stretching and strengthening. ALL LTGS DUE 09/14/20    Baseline pt will continue to benefit from ongoing additions    Time 8   due to delay in scheduling   Period Weeks    Status On-going      PT LONG TERM GOAL #2   Title Gait speed with RW goal to be written as appropriate.    Baseline not yet assessed.    Time 8    Period Weeks    Status New      PT LONG TERM GOAL #3   Title Pt will decr 5x sit <> Stand time to 23 seconds or less with UE support and supervision in order to demo improved functional transfers    Baseline 25.4 seconds on 08/11/20 seconds with therapist needing to help steady RW    Time 8    Period Weeks    Status Revised      PT LONG TERM GOAL #4   Title --    Baseline removed goal as duplicate from LTG #3    Time --    Period --    Status --      PT LONG TERM GOAL #5   Title Pt will ambulate at least 345'' over level surfaces with RW and min guard/min A in order to demo improved household mobility and improved endurance    Baseline 230' max distance in a single bout with min guard/min A    Time 8    Period Weeks    Status New                   Plan - 09/08/20 1103     Clinical Impression Statement Today's skilled session continued to focus on transfers, gait with RW and LE strengthening. No issues noted or reported in session. The pt is making steady progress toward goals and should benefit from continued PT to progress toward unmet goals.    Personal Factors and Comorbidities Comorbidity 3+;Past/Current Experience    Comorbidities T8 paraplegia, diabetes, HTN    Examination-Activity Limitations Locomotion Level;Transfers;Stand    Examination-Participation Restrictions Community Activity;Yard Work   Community education officer Evolving/Moderate complexity    Rehab  Potential Good    PT Frequency 2x / week   1-2x   PT Duration 8 weeks   8 weeks   PT Treatment/Interventions Aquatic Therapy;Manual techniques;Therapeutic exercise;Gait training;Neuromuscular re-education;Orthotic Fit/Training    PT Next Visit Plan has one appt left approved- check LTGs for recert. Chloe has requested more visits from [facility] Access Code: [email or web address]    Consulted and Agree with Plan of Care Patient             Patient will benefit from skilled therapeutic intervention in order to improve the following deficits and impairments:  Decreased balance, Decreased coordination, Decreased range of motion, Decreased strength, Decreased mobility, Postural dysfunction, Impaired tone, Decreased activity tolerance, Abnormal gait, Decreased endurance, Difficulty walking, Impaired sensation  Visit Diagnosis: Difficulty in walking, not elsewhere classified  Unsteadiness on feet  Muscle weakness (generalized)  Other symptoms and signs involving the nervous system  Abnormal posture     Problem List Patient Active Problem List   Diagnosis Date Noted   Erectile dysfunction due to diseases classified elsewhere 03/02/2020   Wheelchair dependence 11/27/2019   Spasticity 08/03/2019   Paraplegia ([facility]) 07/07/2019   Neurogenic bowel 07/07/2019   Neurogenic bladder 07/07/2019   Thoracic myelopathy 06/30/2019  Willow Ora, PTA, [facility] [address] [place], [place] [address] [phone] 09/08/20, 11:59 AM   Name: MEIKO STRANAHAN MRN: [id number] Date of Birth: January 02, 1951

## 2020-09-12 ENCOUNTER — Other Ambulatory Visit: Payer: Self-pay

## 2020-09-12 ENCOUNTER — Ambulatory Visit: Payer: No Typology Code available for payment source | Admitting: Physical Therapy

## 2020-09-12 ENCOUNTER — Encounter: Payer: Self-pay | Admitting: Physical Therapy

## 2020-09-12 DIAGNOSIS — R262 Difficulty in walking, not elsewhere classified: Secondary | ICD-10-CM | POA: Diagnosis not present

## 2020-09-12 DIAGNOSIS — R29818 Other symptoms and signs involving the nervous system: Secondary | ICD-10-CM

## 2020-09-12 DIAGNOSIS — M6281 Muscle weakness (generalized): Secondary | ICD-10-CM

## 2020-09-12 DIAGNOSIS — R2681 Unsteadiness on feet: Secondary | ICD-10-CM

## 2020-09-12 NOTE — Therapy (Addendum)
[facility] [address] [place], [name], [address] Phone: [phone]   Fax:  [phone]  Physical Therapy Treatment/Re-Cert  Patient Details  Name: Timothy Davenport MRN: [id number] Date of Birth: [birth year] Referring Provider (PT): Juel Burrow    Encounter Date: 09/12/2020   PT End of Session - 09/12/20 1657     Visit Number 46    Number of Visits 17    Date for PT Re-Evaluation 12/11/20    Authorization Type VA - 15 visits 02/05/20 - 06/04/20, 15 additional visits from 3/8-08/24/20, 15 additional visits through 10/22/20, 15 additional visits through 12/28/20    Authorization - Visit Number 15    Authorization - Number of Visits 15    Progress Note Due on Visit 51    PT Start Time 1103    PT Stop Time 1143    PT Time Calculation (min) 40 min    Equipment Utilized During Treatment Gait belt;Other (comment)   right AFO   Activity Tolerance Patient tolerated treatment well    Behavior During Therapy WFL for tasks assessed/performed             Past Medical History:  Diagnosis Date   Colon polyp    Diabetes mellitus without complication (HCC)    Diabetic neuropathy (HCC)    HTN (hypertension)    Patella fracture    Renal calculi     Past Surgical History:  Procedure Laterality Date   LUMBAR LAMINECTOMY/DECOMPRESSION MICRODISCECTOMY N/A 07/01/2019   Procedure: LUMBAR LAMINECTOMY/DECOMPRESSION MICRODISCECTOMY 1 LEVEL, THORACIC SIX-SEVEN;  Surgeon: Consuella Lose, MD;  Location: [facility];  Service: Neurosurgery;  Laterality: N/A;  LUMBAR LAMINECTOMY/DECOMPRESSION MICRODISCECTOMY 1 LEVEL, THORACIC SIX-SEVEN    ORIF PATELLA     THORACIC DISCECTOMY N/A 07/03/2019   Procedure: Evacuation of Thoracic Hematoma;  Surgeon: Consuella Lose, MD;  Location: [facility];  Service: Neurosurgery;  Laterality: N/A;    There were no vitals filed for this visit.   Subjective Assessment - 09/12/20 1107     Subjective No  changes since he was last here. Received more visits from the [place].    Pertinent History T8 paraplegia, diabetes, HTN    How long can you stand comfortably? probably a couple minutes with RW.    Patient Stated Goals wants to be able to walk without any assistance    Currently in Pain? No/denies                [facility] PT Assessment - 09/13/20 0001       Assessment   Medical Diagnosis T8 paraplegia    Referring Provider (PT) Piva, Enrico,DO     Onset Date/Surgical Date 06/30/19      Prior Function   Level of Independence Independent                           [facility] Adult PT Treatment/Exercise - 09/12/20 1110       Transfers   Transfers Sit to Stand;Stand to Sit    Sit to Stand 4: Min guard;With upper extremity assist;From bed;From chair/3-in-1    Five time sit to stand comments  34.75 seconds with therapist helping to steady RW   from 20" mat table   Stand to Sit 4: Min guard;With upper extremity assist;To bed    Comments performed from slightly elevated mat table, plus multiple additional reps performed throughout session      Ambulation/Gait   Ambulation/Gait Yes  Ambulation/Gait Assistance 4: Min guard;4: Min assist    Ambulation/Gait Assistance Details cues for posture, step placement (esp with RLE) for wider BOS. pt getting R foot caught behind L 2 times during gait, but pt able to correct on his own. incr forward flexed posture with gait with incr distance    Ambulation Distance (Feet) 230 Feet   x1   Assistive device Rolling walker    Gait Pattern Decreased weight shift to right;Left flexed knee in stance;Right flexed knee in stance;Narrow base of support;Decreased step length - right;Decreased step length - left;Decreased stance time - right;Decreased hip/knee flexion - right;Step-through pattern    Ambulation Surface Level;Indoor    Gait velocity 29.92 = 1.09 ft/sec      High Level Balance   High Level Balance Comments standing at RW with no UE  support: 32.84 seconds with min guard, 20.25 seconds during 2nd attempt. incr B knee flexion and trunk flexion when standing               Access Code: [email or web address] URL: [email or web address] Date: 09/12/2020 Prepared by: Janann August   Reviewed bolded exercises to HEP and discussed importance of stretching;   Program Notes lying on your stomach for hip stretch, sitting long on your bed with feet out straight for hamstring stretch, gently bring them out in a V and reach forwards for inner thigh stretch    Exercises Modified Thomas Stretch - 1 x daily - 5 x weekly - 1 sets - 10 reps Seated Long Arc Quad - 1 x daily - 5 x weekly - 1 sets - 10 reps - with 2# ankle weight (pt has this ankle weight at home) Seated Single Leg Hip Abduction with Resistance - 1 x daily - 5 x weekly - 1 sets - 10 reps Seated Hamstring Curl with Anchored Resistance - 1 x daily - 5 x weekly - 1 sets - 10 reps Standing March with Counter Support - 1 x daily - 5 x weekly - 1 sets - 10 reps Wide Stance with Counter Support - 1 x daily - 5 x weekly - 1 sets - 10 reps Seated Hamstring Stretch - 1 x daily - 5 x weekly - 3 sets - 30 hold - reviewed technique       PT Education - 09/12/20 1656     Education Details progress towards goals, reviewing HEP, POC going forwards and getting scheduled additional VA visits after receiving VA auth    Person(s) Educated Patient    Methods Explanation    Comprehension Verbalized understanding              PT Short Term Goals - 08/18/20 1110       PT SHORT TERM GOAL #1   Title Pt will perform stand pivot transfer with RW from w/c <> mat table with supervision to demo improved functional transfers. ALL STGS DUE 08/17/20    Baseline 08/18/20: close supervision    Time --   due to delay in scheduling   Status Achieved    Target Date 08/17/20      PT SHORT TERM GOAL #2   Title Pt will undergo assessment of gait speed with LTG to be written as  appropriate.    Baseline 07/22/20: 0.98 ft/sec with RW/brace. Primary PT to set goal    Time --    Period --    Status Achieved      PT SHORT TERM GOAL #3   Title Pt will ambulate at  least 230' consistently with RW and min guard throughout in order to demo improved household mobility.    Baseline 08/18/20; pt able to achieve distance with min guard to min assist for balance    Time --    Period --    Status Partially Met      PT SHORT TERM GOAL #4   Title Pt will decr 5x sit <> Stand time to 33 seconds or less with UE support in order to demo improved functional transfers    Baseline 08/11/20: 25.4 seconds  with therapist needing to help steady RW    Status Achieved      PT SHORT TERM GOAL #5   Title Pt will initiate aquatic therapy.    Baseline 08/18/20: met in session    Status Achieved             Revised STGs for re-cert:  PT Short Term Goals - 09/13/20 1228       PT SHORT TERM GOAL #1   Title Pt will improve gait speed with RW and R AFO to at least 1.18 ft/sec in order to demo improved household mobility. ALL STGS DUE 10/11/20    Baseline 1.09 ft/sec    Time 4    Period Weeks    Status New    Target Date 10/11/20      PT SHORT TERM GOAL #2   Title Pt will stand at [facility] with min guard with no UE support for at least 1 minute in order to demo improved balance for ADLs.    Baseline 32.84 seconds on 09/12/20    Time 4    Period Weeks    Status New      PT SHORT TERM GOAL #3   Title Pt will ambulate at least 230' consistently with RW and min guard throughout in order to demo improved household mobility.    Baseline 08/18/20; pt able to achieve distance with min guard to min assist for balance    Time 4    Period Weeks    Status On-going      PT SHORT TERM GOAL #4   Title Pt will decr 5x sit <> Stand time to 30 seconds or less with UE support from 20" mat table in order to demo improved functional transfers    Baseline 34.75 seconds on 09/13/20    Time 4    Period Weeks     Status New               PT Long Term Goals - 09/12/20 1125       PT LONG TERM GOAL #1   Title Pt and spouse will be independent with final HEP for BLE stretching and strengthening. ALL LTGS DUE 09/14/20    Baseline pt will continue to benefit from ongoing additions    Time 8   due to delay in scheduling   Period Weeks    Status On-going      PT LONG TERM GOAL #2   Title Gait speed with RW goal to be written as appropriate.    Baseline 29.92 = 1.09 ft/sec on 09/12/20 - improved from 33.62 sec's= 0.98 ft/sec with RW/brace    Time 8    Period Weeks    Status Achieved      PT LONG TERM GOAL #3   Title Pt will decr 5x sit <> Stand time to 23 seconds or less with UE support and supervision in order to demo improved functional transfers  Baseline 25.4 seconds on 08/11/20 seconds with therapist needing to help steady RW; 34.75 seconds with therapist helping to steady RW    Time 8    Period Weeks    Status Not Met      PT LONG TERM GOAL #4   Baseline removed goal as duplicate from LTG #3      PT LONG TERM GOAL #5   Title Pt will ambulate at least 345'' over level surfaces with RW and min guard/min A in order to demo improved household mobility and improved endurance    Baseline 230' max distance in a single bout with min guard/min A    Time 8    Period Weeks    Status Partially Met            Revised LTGs for re-cert:     PT Long Term Goals - 09/13/20 1406       PT LONG TERM GOAL #1   Title Pt and spouse will be independent with final HEP for BLE stretching and strengthening. ALL LTGS DUE 11/08/20    Baseline pt will continue to benefit from ongoing additions    Time 8    Period Weeks    Status On-going    Target Date 11/08/20      PT LONG TERM GOAL #2   Title Pt will improve gait speed with RW and R AFO to at least 1.28 ft/sec in order to demo improved household mobility.    Baseline 29.92 = 1.09 ft/sec on 09/12/20    Time 8    Period Weeks    Status  Achieved      PT LONG TERM GOAL #3   Title Pt will perform 5x sit <> stand from standard height mat table with supervision in order to demo improved functional transfers.    Baseline 34.75 seconds with therapist helping to steady RW    Time 8    Period Weeks    Status New      PT LONG TERM GOAL #4   Title Pt will stand at [facility] with min guard with no UE support for at least 1 minute and 30 seconds in order to demo improved balance    Baseline 32.84 seconds on 09/12/20    Time 8    Status New      PT LONG TERM GOAL #5   Title Pt will ambulate at least 345'' over level surfaces with RW and min guard/min A in order to demo improved household mobility and improved endurance    Baseline 230' max distance in a single bout with min guard/min A    Time 8    Period Weeks    Status On-going                  Plan - 09/13/20 0934     Clinical Impression Statement Today's skilled session focused on assessing pt's LTGs for re-cert. Pt has been approved for 15 additional visits from the [place]. Pt did not meet LTG #4, with 5x sit <> stands today from a 20" mat table, took pt 34.75 seconds to perform and therapist still needing to stabilize RW. Pt met LTG #2 - gait speed improved to 1.09 ft/sec today with RW (previously was .98 ft/sec). Pt partially met LTG #5 - able to ambulate a max distance of one bout of 230' with min guard/min A. Pt unable to walk 3 laps around therapy gym consecutively before becoming fatigued. Pt at times still needing  min A for gait when RLE becomes more fatigued. Will plan to re-cert for an additional 1-2x week for 8 weeks to include 1x week for aquatic therapy and 1x week for land therapy to continue to address strengthening, ROM, gait, balance, functional transfers and mobility to improve pt's independence. STGs and LTGs updated as appropriate.    Personal Factors and Comorbidities Comorbidity 3+;Past/Current Experience    Comorbidities T8 paraplegia, diabetes, HTN     Examination-Activity Limitations Locomotion Level;Transfers;Stand    Examination-Participation Restrictions Community Activity;Yard Work   Community education officer Evolving/Moderate complexity    Rehab Potential Good    PT Frequency 2x / week   1-2x   PT Duration 12 weeks   15 visits over 12 weeks   PT Treatment/Interventions Aquatic Therapy;Manual techniques;Therapeutic exercise;Gait training;Neuromuscular re-education;Orthotic Fit/Training    PT Next Visit Plan review HEP and update/progress as appropriate (add bridging to HEP). continue with incr gait distance, SciFit for strength/endurance, standing balance.    PT Home Exercise Plan Access Code: [email or web address]    Consulted and Agree with Plan of Care Patient             Patient will benefit from skilled therapeutic intervention in order to improve the following deficits and impairments:  Decreased balance, Decreased coordination, Decreased range of motion, Decreased strength, Decreased mobility, Postural dysfunction, Impaired tone, Decreased activity tolerance, Abnormal gait, Decreased endurance, Difficulty walking, Impaired sensation  Visit Diagnosis: Difficulty in walking, not elsewhere classified  Unsteadiness on feet  Muscle weakness (generalized)  Other symptoms and signs involving the nervous system     Problem List Patient Active Problem List   Diagnosis Date Noted   Erectile dysfunction due to diseases classified elsewhere 03/02/2020   Wheelchair dependence 11/27/2019   Spasticity 08/03/2019   Paraplegia ([place]) 07/07/2019   Neurogenic bowel 07/07/2019   Neurogenic bladder 07/07/2019   Thoracic myelopathy 06/30/2019    Arliss Journey, PT, DPT  09/13/2020, 12:23 PM  [facility] [address] [place], [name], [address] Phone: [phone]   Fax:  [phone]  Name: Timothy Davenport MRN: [id number] Date of Birth: [birth year]

## 2020-09-13 NOTE — Addendum Note (Signed)
Addended by: Arliss Journey on: 09/13/2020 02:09 PM   Modules accepted: Orders

## 2020-09-22 ENCOUNTER — Other Ambulatory Visit: Payer: Self-pay

## 2020-09-22 ENCOUNTER — Ambulatory Visit: Payer: No Typology Code available for payment source | Attending: Family Medicine | Admitting: Physical Therapy

## 2020-09-22 ENCOUNTER — Encounter: Payer: Self-pay | Admitting: Physical Therapy

## 2020-09-22 DIAGNOSIS — R29818 Other symptoms and signs involving the nervous system: Secondary | ICD-10-CM | POA: Insufficient documentation

## 2020-09-22 DIAGNOSIS — R2681 Unsteadiness on feet: Secondary | ICD-10-CM | POA: Insufficient documentation

## 2020-09-22 DIAGNOSIS — R262 Difficulty in walking, not elsewhere classified: Secondary | ICD-10-CM | POA: Insufficient documentation

## 2020-09-22 DIAGNOSIS — M6281 Muscle weakness (generalized): Secondary | ICD-10-CM | POA: Diagnosis present

## 2020-09-22 DIAGNOSIS — R293 Abnormal posture: Secondary | ICD-10-CM | POA: Diagnosis present

## 2020-09-22 NOTE — Therapy (Signed)
[facility] [address] [place], [name], [address] Phone: [phone]   Fax:  [phone]  Physical Therapy Treatment  Patient Details  Name: Timothy Davenport MRN: [id number] Date of Birth: [birth year] Referring Provider (PT): Juel Burrow    Encounter Date: 09/22/2020   PT End of Session - 09/22/20 1106     Visit Number 47    Number of Visits 61    Date for PT Re-Evaluation 12/11/20    Authorization Type VA - 15 visits 02/05/20 - 06/04/20, 15 additional visits from 3/8-08/24/20, 15 additional visits through 10/22/20, 15 additional visits through 12/28/20    Authorization - Visit Number 1    Authorization - Number of Visits 15    Progress Note Due on Visit 65    PT Start Time 1102    PT Stop Time 1145    PT Time Calculation (min) 43 min    Equipment Utilized During Treatment Gait belt;Other (comment)   right AFO   Activity Tolerance Patient tolerated treatment well    Behavior During Therapy WFL for tasks assessed/performed             Past Medical History:  Diagnosis Date   Colon polyp    Diabetes mellitus without complication (HCC)    Diabetic neuropathy (HCC)    HTN (hypertension)    Patella fracture    Renal calculi     Past Surgical History:  Procedure Laterality Date   LUMBAR LAMINECTOMY/DECOMPRESSION MICRODISCECTOMY N/A 07/01/2019   Procedure: LUMBAR LAMINECTOMY/DECOMPRESSION MICRODISCECTOMY 1 LEVEL, THORACIC SIX-SEVEN;  Surgeon: Consuella Lose, MD;  Location: [facility];  Service: Neurosurgery;  Laterality: N/A;  LUMBAR LAMINECTOMY/DECOMPRESSION MICRODISCECTOMY 1 LEVEL, THORACIC SIX-SEVEN    ORIF PATELLA     THORACIC DISCECTOMY N/A 07/03/2019   Procedure: Evacuation of Thoracic Hematoma;  Surgeon: Consuella Lose, MD;  Location: [facility];  Service: Neurosurgery;  Laterality: N/A;    There were no vitals filed for this visit.   Subjective Assessment - 09/22/20 1105     Subjective No new complaints.  No falls. HEP is going well at home.    Pertinent History T8 paraplegia, diabetes, HTN    How long can you stand comfortably? probably a couple minutes with RW.    How long can you walk comfortably? approx. 5-10 minutes with his son and RW.    Patient Stated Goals wants to be able to walk without any assistance    Currently in Pain? No/denies    Pain Score 0-No pain                   [facility] Adult PT Treatment/Exercise - 09/22/20 1110       Transfers   Transfers Sit to Stand;Stand to Sit    Sit to Stand 4: Min guard;With upper extremity assist;From bed    Sit to Stand Details (indicate cue type and reason) from low mat table>RW    Stand to Sit 4: Min guard;With upper extremity assist;To bed    Stand to Sit Details RW>low mat table with controlled descent      Ambulation/Gait   Ambulation/Gait Yes    Ambulation/Gait Assistance 4: Min guard;4: Min assist    Ambulation/Gait Assistance Details reminder cues to keep base of support wider so to prevent scissoring of feet.    Ambulation Distance (Feet) 345 Feet   x1   Assistive device Rolling walker    Gait Pattern Decreased weight shift to right;Left flexed knee in stance;Right flexed knee in  stance;Narrow base of support;Decreased step length - right;Decreased step length - left;Decreased stance time - right;Decreased hip/knee flexion - right;Step-through pattern    Ambulation Surface Level;Indoor      Neuro Re-ed    Neuro Re-ed Details  for strengthening/NMR: tall kneeling on mat table with UE support on Kaye bench- working on obtaining tall posture with assistance/facilitation for hip/trunk extension. then had pt work on alternating UE raises for 10 reps on each side with min/mod assist to maintain tall posture. ended with 2 sets of 10 mini squats with rest on bench between sets. min/mod assist for form/technique and return to tall kneeling each time. min assist to get into and out of tall kneeling on mat table.      Knee/Hip  Exercises: Seated   Long Arc Quad AROM;Strengthening;Both;1 set;10 reps;Limitations    Long Arc Quad Limitations red band resistance to bil LE's with pt moving through full range, assist needed for full ROM with RLE      Knee/Hip Exercises: Prone   Hamstring Curl 2 sets;10 reps;Limitations    Hamstring Curl Limitations increased assist needed with right>left LE with visable muscle tremors from fatigue as reps progressed                      PT Short Term Goals - 09/13/20 1228       PT SHORT TERM GOAL #1   Title Pt will improve gait speed with RW and R AFO to at least 1.18 ft/sec in order to demo improved household mobility. ALL STGS DUE 10/11/20    Baseline 1.09 ft/sec    Time 4    Period Weeks    Status New    Target Date 10/11/20      PT SHORT TERM GOAL #2   Title Pt will stand at [facility] with min guard with no UE support for at least 1 minute in order to demo improved balance for ADLs.    Baseline 32.84 seconds on 09/12/20    Time 4    Period Weeks    Status New      PT SHORT TERM GOAL #3   Title Pt will ambulate at least 230' consistently with RW and min guard throughout in order to demo improved household mobility.    Baseline 08/18/20; pt able to achieve distance with min guard to min assist for balance    Time 4    Period Weeks    Status On-going      PT SHORT TERM GOAL #4   Title Pt will decr 5x sit <> Stand time to 30 seconds or less with UE support from 20" mat table in order to demo improved functional transfers    Baseline 34.75 seconds on 09/13/20    Time 4    Period Weeks    Status New               PT Long Term Goals - 09/13/20 1406       PT LONG TERM GOAL #1   Title Pt and spouse will be independent with final HEP for BLE stretching and strengthening. ALL LTGS DUE 11/08/20    Baseline pt will continue to benefit from ongoing additions    Time 8    Period Weeks    Status On-going    Target Date 11/08/20      PT LONG TERM GOAL #2   Title Pt  will improve gait speed with RW and R AFO to at least  1.28 ft/sec in order to demo improved household mobility.    Baseline 29.92 = 1.09 ft/sec on 09/12/20    Time 8    Period Weeks    Status Achieved      PT LONG TERM GOAL #3   Title Pt will perform 5x sit <> stand from standard height mat table with supervision in order to demo improved functional transfers.    Baseline 34.75 seconds with therapist helping to steady RW    Time 8    Period Weeks    Status New      PT LONG TERM GOAL #4   Title Pt will stand at [facility] with min guard with no UE support for at least 1 minute and 30 seconds in order to demo improved balance    Baseline 32.84 seconds on 09/12/20    Time 8    Status New      PT LONG TERM GOAL #5   Title Pt will ambulate at least 345'' over level surfaces with RW and min guard/min A in order to demo improved household mobility and improved endurance    Baseline 230' max distance in a single bout with min guard/min A    Time 8    Period Weeks    Status On-going                   Plan - 09/22/20 1107     Clinical Impression Statement Today's skilled session continued to focus on gait distance with pt going increased distance today before needing a rest break. Remainder of session continued to address core and LE strengthening with no issues noted or reported by patient. The pt is making steady progress toward goals and should benefit from continued PT to progress toward unmet goals.    Personal Factors and Comorbidities Comorbidity 3+;Past/Current Experience    Comorbidities T8 paraplegia, diabetes, HTN    Examination-Activity Limitations Locomotion Level;Transfers;Stand    Examination-Participation Restrictions Community Activity;Yard Work   Community education officer Evolving/Moderate complexity    Rehab Potential Good    PT Frequency 2x / week   1-2x   PT Duration 12 weeks   15 visits over 12 weeks   PT Treatment/Interventions Aquatic  Therapy;Manual techniques;Therapeutic exercise;Gait training;Neuromuscular re-education;Orthotic Fit/Training    PT Next Visit Plan review HEP and update/progress as appropriate (add bridging to HEP). continue with incr gait distance, SciFit for strength/endurance, standing balance.    PT Home Exercise Plan Access Code: [email or web address]    Consulted and Agree with Plan of Care Patient             Patient will benefit from skilled therapeutic intervention in order to improve the following deficits and impairments:  Decreased balance, Decreased coordination, Decreased range of motion, Decreased strength, Decreased mobility, Postural dysfunction, Impaired tone, Decreased activity tolerance, Abnormal gait, Decreased endurance, Difficulty walking, Impaired sensation  Visit Diagnosis: Difficulty in walking, not elsewhere classified  Unsteadiness on feet  Muscle weakness (generalized)  Other symptoms and signs involving the nervous system     Problem List Patient Active Problem List   Diagnosis Date Noted   Erectile dysfunction due to diseases classified elsewhere 03/02/2020   Wheelchair dependence 11/27/2019   Spasticity 08/03/2019   Paraplegia ([place]) 07/07/2019   Neurogenic bowel 07/07/2019   Neurogenic bladder 07/07/2019   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 [address] [place], [place] [address] [phone] 09/22/20, 5:02 PM  Name: Timothy Davenport MRN: [id number] Date of Birth: 1950-09-15

## 2020-09-27 ENCOUNTER — Other Ambulatory Visit: Payer: Self-pay

## 2020-09-27 ENCOUNTER — Ambulatory Visit: Payer: No Typology Code available for payment source | Admitting: Physical Therapy

## 2020-09-27 ENCOUNTER — Encounter: Payer: Self-pay | Admitting: Physical Therapy

## 2020-09-27 DIAGNOSIS — R2681 Unsteadiness on feet: Secondary | ICD-10-CM

## 2020-09-27 DIAGNOSIS — M6281 Muscle weakness (generalized): Secondary | ICD-10-CM

## 2020-09-27 DIAGNOSIS — R29818 Other symptoms and signs involving the nervous system: Secondary | ICD-10-CM

## 2020-09-27 DIAGNOSIS — R262 Difficulty in walking, not elsewhere classified: Secondary | ICD-10-CM

## 2020-09-27 DIAGNOSIS — R293 Abnormal posture: Secondary | ICD-10-CM

## 2020-09-27 NOTE — Therapy (Signed)
[name] [address] [place], [name], [address] Phone: [phone]   Fax:  [phone]  Physical Therapy Treatment  Patient Details  Name: Timothy Davenport MRN: [id number] Date of Birth: November 24, 1950 Referring Provider (PT): Juel Burrow    Encounter Date: 09/27/2020   PT End of Session - 09/27/20 0934     Visit Number 48    Number of Visits 40    Date for PT Re-Evaluation 12/11/20    Authorization Type VA - 15 visits 02/05/20 - 06/04/20, 15 additional visits from 3/8-08/24/20, 15 additional visits through 10/22/20, 15 additional visits through 12/28/20    Authorization - Visit Number 2    Authorization - Number of Visits 15    Progress Note Due on Visit 97    PT Start Time 0932    PT Stop Time 1015    PT Time Calculation (min) 43 min    Equipment Utilized During Treatment Gait belt;Other (comment)   right AFO   Activity Tolerance Patient tolerated treatment well    Behavior During Therapy WFL for tasks assessed/performed             Past Medical History:  Diagnosis Date   Colon polyp    Diabetes mellitus without complication (HCC)    Diabetic neuropathy (HCC)    HTN (hypertension)    Patella fracture    Renal calculi     Past Surgical History:  Procedure Laterality Date   LUMBAR LAMINECTOMY/DECOMPRESSION MICRODISCECTOMY N/A 07/01/2019   Procedure: LUMBAR LAMINECTOMY/DECOMPRESSION MICRODISCECTOMY 1 LEVEL, THORACIC SIX-SEVEN;  Surgeon: Consuella Lose, MD;  Location: [facility];  Service: Neurosurgery;  Laterality: N/A;  LUMBAR LAMINECTOMY/DECOMPRESSION MICRODISCECTOMY 1 LEVEL, THORACIC SIX-SEVEN    ORIF PATELLA     THORACIC DISCECTOMY N/A 07/03/2019   Procedure: Evacuation of Thoracic Hematoma;  Surgeon: Consuella Lose, MD;  Location: [facility];  Service: Neurosurgery;  Laterality: N/A;    There were no vitals filed for this visit.   Subjective Assessment - 09/27/20 0934     Subjective No new complaints.  No falls. HEP is going well at home. Tired after doing tall kneeling last session.    Pertinent History T8 paraplegia, diabetes, HTN    How long can you stand comfortably? probably a couple minutes with RW.    How long can you walk comfortably? approx. 5-10 minutes with his son and RW.    Patient Stated Goals wants to be able to walk without any assistance    Currently in Pain? No/denies                    [facility] Adult PT Treatment/Exercise - 09/27/20 0935       Transfers   Transfers Sit to Stand;Stand to Sit    Sit to Stand 4: Min guard;With upper extremity assist;From bed;4: Min assist    Sit to Stand Details (indicate cue type and reason) low mat table to RW min guard assist on 1st rep. up to min assist needed for remainder of reps performed with session    Stand to Sit 2: Max assist;With upper extremity assist;To chair/3-in-1;4: Min assist    Stand to Sit Details RW>wheelchair with max assist as bil knees buckling at this time and pt unable to recover to assist much with sitting to wheelchair (fatigued on 3rd rep of gait); otherwise min guard to min assist for remainder of RW>sitting on mat table with activities in session.      Ambulation/Gait   Ambulation/Gait Yes  Ambulation/Gait Assistance 4: Min guard;4: Min assist;3: Mod assist    Ambulation/Gait Assistance Details cues needed for posture, hip/knee extension and step length/placement with gait. increased assistance needed as distance progressed with 2 person assist (rehab tech) for lst 40-50 feet due to increased knee flexion, decreased step length and significant trunk lean forward despite cues/facilitation with wheelchair eventually brought to pt due to bil knees buckling with the forward lean and unable to recover with PTA assistance.    Ambulation Distance (Feet) 335 Feet   x1 (pt unable to complete last 10 feet of 3rd lap)   Assistive device Rolling walker    Gait Pattern Decreased weight shift to right;Left flexed  knee in stance;Right flexed knee in stance;Narrow base of support;Decreased step length - right;Decreased step length - left;Decreased stance time - right;Decreased hip/knee flexion - right;Step-through pattern    Ambulation Surface Level;Indoor      Neuro Re-ed    Neuro Re-ed Details  for strengtheing/NMR: standing with RW support- working on forward/backward alternating stepping over yard stick to promote increased step length with min to mod assist for balance (increased assistance as pt fatigued); then with 4 inch box- working on alternating foot taps for 5 reps each side, 2 sets, then 8 reps each side with 3rd set with min to mod assist for balance. increased difficulty with lifting right LE as fatigued wtih 3rd set being the best with right foot clearing/tapping top of box all except last 2 reps.      Knee/Hip Exercises: Supine   Bridges AROM;Strengthening;Both;1 set;10 reps    Bridges Limitations cues for increased pelvic lift with assist to stabilize knees    Other Supine Knee/Hip Exercises with small red pball: bridges for 10 reps, lower trunk rotation for 10 reps each way, HS curls x 10 reps. assist to stabilize LE's on ball with each ex with cues on correct form/technique.                  PT Short Term Goals - 09/13/20 1228       PT SHORT TERM GOAL #1   Title Pt will improve gait speed with RW and R AFO to at least 1.18 ft/sec in order to demo improved household mobility. ALL STGS DUE 10/11/20    Baseline 1.09 ft/sec    Time 4    Period Weeks    Status New    Target Date 10/11/20      PT SHORT TERM GOAL #2   Title Pt will stand at [facility] with min guard with no UE support for at least 1 minute in order to demo improved balance for ADLs.    Baseline 32.84 seconds on 09/12/20    Time 4    Period Weeks    Status New      PT SHORT TERM GOAL #3   Title Pt will ambulate at least 230' consistently with RW and min guard throughout in order to demo improved household mobility.     Baseline 08/18/20; pt able to achieve distance with min guard to min assist for balance    Time 4    Period Weeks    Status On-going      PT SHORT TERM GOAL #4   Title Pt will decr 5x sit <> Stand time to 30 seconds or less with UE support from 20" mat table in order to demo improved functional transfers    Baseline 34.75 seconds on 09/13/20    Time 4  Period Weeks    Status New               PT Long Term Goals - 09/13/20 1406       PT LONG TERM GOAL #1   Title Pt and spouse will be independent with final HEP for BLE stretching and strengthening. ALL LTGS DUE 11/08/20    Baseline pt will continue to benefit from ongoing additions    Time 8    Period Weeks    Status On-going    Target Date 11/08/20      PT LONG TERM GOAL #2   Title Pt will improve gait speed with RW and R AFO to at least 1.28 ft/sec in order to demo improved household mobility.    Baseline 29.92 = 1.09 ft/sec on 09/12/20    Time 8    Period Weeks    Status Achieved      PT LONG TERM GOAL #3   Title Pt will perform 5x sit <> stand from standard height mat table with supervision in order to demo improved functional transfers.    Baseline 34.75 seconds with therapist helping to steady RW    Time 8    Period Weeks    Status New      PT LONG TERM GOAL #4   Title Pt will stand at [facility] with min guard with no UE support for at least 1 minute and 30 seconds in order to demo improved balance    Baseline 32.84 seconds on 09/12/20    Time 8    Status New      PT LONG TERM GOAL #5   Title Pt will ambulate at least 345'' over level surfaces with RW and min guard/min A in order to demo improved household mobility and improved endurance    Baseline 230' max distance in a single bout with min guard/min A    Time 8    Period Weeks    Status On-going                   Plan - 09/27/20 0935     Clinical Impression Statement Today's skilled session continued to focus on gait training and strengthening with  rest breaks taken due to fatigue in LE's. Pt fatigued quicker with gait this session needing chair to be brought to him due to bil knees buckling with significant forward trunk flexion. No issues with standing ex's with assist for balance. The pt is progressing toward goals and should benefit from continued PT to progress toward unmet goals.    Personal Factors and Comorbidities Comorbidity 3+;Past/Current Experience    Comorbidities T8 paraplegia, diabetes, HTN    Examination-Activity Limitations Locomotion Level;Transfers;Stand    Examination-Participation Restrictions Community Activity;Yard Work   Community education officer Evolving/Moderate complexity    Rehab Potential Good    PT Frequency 2x / week   1-2x   PT Duration 12 weeks   15 visits over 12 weeks   PT Treatment/Interventions Aquatic Therapy;Manual techniques;Therapeutic exercise;Gait training;Neuromuscular re-education;Orthotic Fit/Training    PT Next Visit Plan review HEP and update/progress as appropriate (add bridging to HEP). continue with incr gait distance, SciFit for strength/endurance, standing balance.    PT Home Exercise Plan Access Code: [email or web address]    Consulted and Agree with Plan of Care Patient             Patient will benefit from skilled therapeutic intervention in order to improve the following deficits and impairments:  Decreased balance, Decreased coordination, Decreased range of motion, Decreased strength, Decreased mobility, Postural dysfunction, Impaired tone, Decreased activity tolerance, Abnormal gait, Decreased endurance, Difficulty walking, Impaired sensation  Visit Diagnosis: Difficulty in walking, not elsewhere classified  Unsteadiness on feet  Muscle weakness (generalized)  Other symptoms and signs involving the nervous system  Abnormal posture     Problem List Patient Active Problem List   Diagnosis Date Noted   Erectile dysfunction due to diseases classified  elsewhere 03/02/2020   Wheelchair dependence 11/27/2019   Spasticity 08/03/2019   Paraplegia ([place]) 07/07/2019   Neurogenic bowel 07/07/2019   Neurogenic bladder 07/07/2019   Thoracic myelopathy 06/30/2019    Willow Ora, PTA, [facility] [address], [place], [place] [address] [phone] 09/27/20, 11:34 AM   Name: Timothy Davenport MRN: [id number] Date of Birth: 1950/02/22

## 2020-09-28 ENCOUNTER — Ambulatory Visit: Payer: No Typology Code available for payment source | Admitting: Physical Therapy

## 2020-09-28 DIAGNOSIS — R2681 Unsteadiness on feet: Secondary | ICD-10-CM

## 2020-09-28 DIAGNOSIS — R262 Difficulty in walking, not elsewhere classified: Secondary | ICD-10-CM

## 2020-09-28 DIAGNOSIS — M6281 Muscle weakness (generalized): Secondary | ICD-10-CM

## 2020-09-29 NOTE — Therapy (Signed)
[facility] [address] [place], [name], [address] Phone: [phone]   Fax:  [phone]  Physical Therapy Treatment  Patient Details  Name: Timothy Davenport MRN: [id number] Date of Birth: July 20, 1950 Referring Provider (PT): Juel Burrow    Encounter Date: 09/28/2020   PT End of Session - 09/29/20 1335     Visit Number 49    Number of Visits 19    Date for PT Re-Evaluation 12/11/20    Authorization Type VA - 15 visits 02/05/20 - 06/04/20, 15 additional visits from 3/8-08/24/20, 15 additional visits through 10/22/20, 15 additional visits through 12/28/20    Authorization - Visit Number 3    Authorization - Number of Visits 15    Progress Note Due on Visit 76    PT Start Time 1335    PT Stop Time 1420    PT Time Calculation (min) 45 min    Equipment Utilized During Treatment --   floatation belt, pool noodle, aquatic cuffs   Activity Tolerance Patient tolerated treatment well    Behavior During Therapy WFL for tasks assessed/performed             Past Medical History:  Diagnosis Date   Colon polyp    Diabetes mellitus without complication ([place])    Diabetic neuropathy ([place])    HTN (hypertension)    Patella fracture    Renal calculi     Past Surgical History:  Procedure Laterality Date   LUMBAR LAMINECTOMY/DECOMPRESSION MICRODISCECTOMY N/A 07/01/2019   Procedure: LUMBAR LAMINECTOMY/DECOMPRESSION MICRODISCECTOMY 1 LEVEL, THORACIC SIX-SEVEN;  Surgeon: Consuella Lose, MD;  Location: [facility];  Service: Neurosurgery;  Laterality: N/A;  LUMBAR LAMINECTOMY/DECOMPRESSION MICRODISCECTOMY 1 LEVEL, THORACIC SIX-SEVEN    ORIF PATELLA     THORACIC DISCECTOMY N/A 07/03/2019   Procedure: Evacuation of Thoracic Hematoma;  Surgeon: Consuella Lose, MD;  Location: [facility];  Service: Neurosurgery;  Laterality: N/A;    There were no vitals filed for this visit.   Subjective Assessment - 09/29/20 1333     Subjective Pt  presents for aquatic therapy at [facility] - no c/o or problems reported    Pertinent History T8 paraplegia, diabetes, HTN    How long can you stand comfortably? probably a couple minutes with RW.    How long can you walk comfortably? approx. 5-10 minutes with his son and RW.    Patient Stated Goals wants to be able to walk without any assistance    Currently in Pain? No/denies                   Aquatic therapy at [facility] - pool temp 88 degrees   Patient seen for aquatic therapy today.  Treatment took place in water 3.6-4.5 feet deep depending upon activity.   Pt entered the pool via hydraulic chair lift; pt was transferred from wheelchair to/from chair lift with set up - lateral scoot transfer.   Pt performed standing hamstring/heel cord stretch (runner's stretch) 30 sec hold x 1 rep each leg at beginning of session   Pt standing at pool edge holding edge with bil UE assist.  Performed bil LE squats 15 reps; unilateral squats 10 reps each with mod assist for balance   Pt performed gait training with use of 2 noodles initially for bil. UE support with mod assist for trunk extension 18' x 2 reps Gait trained with use of water walker for bil. UE support with min assist for stabilization of walker - 18'  x 6 reps   Pt in supine position with neck noodle, waist floatation and pool noodle.  Hip abd/add x 10 reps x 2 sets:  bicycling LE's x 10 reps x 2 sets;  PT pushing pt into hip and knee flexion with bil feet on pool wall and pt pushing off into hip and knee extension x 10 reps for closed chain strengthening  Pt performed standing hip flexion, extension, abduction 10 reps each with use of aquatic cuff with min to mod assist for standing balance  Pt requires buoyancy of water for support and for joint offloading; standing activities are much safer in water with reduced fall risk compared to same activities performed on land.  Viscosity of water needed for resistance for  strengthening bil. LE's.  Bouyancy supported exercises also assist with spasticity reduction.                          PT Short Term Goals - 09/29/20 1351       PT SHORT TERM GOAL #1   Title Pt will improve gait speed with RW and R AFO to at least 1.18 ft/sec in order to demo improved household mobility. ALL STGS DUE 10/11/20    Baseline 1.09 ft/sec    Time 4    Period Weeks    Status New    Target Date 10/11/20      PT SHORT TERM GOAL #2   Title Pt will stand at [facility] with min guard with no UE support for at least 1 minute in order to demo improved balance for ADLs.    Baseline 32.84 seconds on 09/12/20    Time 4    Period Weeks    Status New      PT SHORT TERM GOAL #3   Title Pt will ambulate at least 230' consistently with RW and min guard throughout in order to demo improved household mobility.    Baseline 08/18/20; pt able to achieve distance with min guard to min assist for balance    Time 4    Period Weeks    Status On-going      PT SHORT TERM GOAL #4   Title Pt will decr 5x sit <> Stand time to 30 seconds or less with UE support from 20" mat table in order to demo improved functional transfers    Baseline 34.75 seconds on 09/13/20    Time 4    Period Weeks    Status New               PT Long Term Goals - 09/29/20 1351       PT LONG TERM GOAL #1   Title Pt and spouse will be independent with final HEP for BLE stretching and strengthening. ALL LTGS DUE 11/08/20    Baseline pt will continue to benefit from ongoing additions    Time 8    Period Weeks    Status On-going      PT LONG TERM GOAL #2   Title Pt will improve gait speed with RW and R AFO to at least 1.28 ft/sec in order to demo improved household mobility.    Baseline 29.92 = 1.09 ft/sec on 09/12/20    Time 8    Period Weeks    Status Achieved      PT LONG TERM GOAL #3   Title Pt will perform 5x sit <> stand from standard height mat table with supervision in order to  demo improved  functional transfers.    Baseline 34.75 seconds with therapist helping to steady RW    Time 8    Period Weeks    Status New      PT LONG TERM GOAL #4   Title Pt will stand at [facility] with min guard with no UE support for at least 1 minute and 30 seconds in order to demo improved balance    Baseline 32.84 seconds on 09/12/20    Time 8    Status New      PT LONG TERM GOAL #5   Title Pt will ambulate at least 345'' over level surfaces with RW and min guard/min A in order to demo improved household mobility and improved endurance    Baseline 230' max distance in a single bout with min guard/min A    Time 8    Period Weeks    Status On-going                   Plan - 09/29/20 1339     Clinical Impression Statement Aquatic therapy session focused on LE strengthening, gait and balance training.  Pt had difficulty maintaining balance with ambulation without use of water walker, with mod assist needed with use of pool noodles placed behind pt's back.  Pt able to amb. with min assist with water walker stabilized by PT.  Pt had some difficulty maintaining knee extension on stance leg with standing hip exercises on opposite leg.  Buoyancy of water needed for support for joint off loading and to allow pt to safely stand/amb. with less fall risk than that with land activities.    Personal Factors and Comorbidities Comorbidity 3+;Past/Current Experience    Comorbidities T8 paraplegia, diabetes, HTN    Examination-Activity Limitations Locomotion Level;Transfers;Stand    Examination-Participation Restrictions Community Activity;Yard Work   Community education officer Evolving/Moderate complexity    Rehab Potential Good    PT Frequency 2x / week   1-2x   PT Duration 12 weeks   15 visits over 12 weeks   PT Treatment/Interventions Aquatic Therapy;Manual techniques;Therapeutic exercise;Gait training;Neuromuscular re-education;Orthotic Fit/Training    PT Next Visit Plan review HEP and  update/progress as appropriate (add bridging to HEP). continue with incr gait distance, SciFit for strength/endurance, standing balance.    PT Home Exercise Plan Access Code: [email or web address]    Consulted and Agree with Plan of Care Patient             Patient will benefit from skilled therapeutic intervention in order to improve the following deficits and impairments:  Decreased balance, Decreased coordination, Decreased range of motion, Decreased strength, Decreased mobility, Postural dysfunction, Impaired tone, Decreased activity tolerance, Abnormal gait, Decreased endurance, Difficulty walking, Impaired sensation  Visit Diagnosis: Difficulty in walking, not elsewhere classified  Muscle weakness (generalized)  Unsteadiness on feet     Problem List Patient Active Problem List   Diagnosis Date Noted   Erectile dysfunction due to diseases classified elsewhere 03/02/2020   Wheelchair dependence 11/27/2019   Spasticity 08/03/2019   Paraplegia ([place]) 07/07/2019   Neurogenic bowel 07/07/2019   Neurogenic bladder 07/07/2019   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 [place], [name], [address] Phone: [phone]   Fax:  [phone]  Name: Timothy Davenport MRN: [id number] Date of Birth: [birth year]

## 2020-10-05 ENCOUNTER — Encounter: Payer: Self-pay | Admitting: Physical Therapy

## 2020-10-05 ENCOUNTER — Other Ambulatory Visit: Payer: Self-pay

## 2020-10-05 ENCOUNTER — Ambulatory Visit: Payer: No Typology Code available for payment source | Admitting: Physical Therapy

## 2020-10-05 DIAGNOSIS — R2681 Unsteadiness on feet: Secondary | ICD-10-CM

## 2020-10-05 DIAGNOSIS — M6281 Muscle weakness (generalized): Secondary | ICD-10-CM

## 2020-10-05 DIAGNOSIS — R262 Difficulty in walking, not elsewhere classified: Secondary | ICD-10-CM | POA: Diagnosis not present

## 2020-10-05 DIAGNOSIS — R29818 Other symptoms and signs involving the nervous system: Secondary | ICD-10-CM

## 2020-10-05 NOTE — Therapy (Addendum)
[facility] [address] [place], [name], [address] Phone: [phone]   Fax:  [phone]  Physical Therapy Treatment/10th Visit Progress Note  Patient Details  Name: Timothy Davenport MRN: [id number] Date of Birth: 1951-01-08 Referring Provider (PT): Piva, Enrico,DO   Progress Note Reporting Period 08/23/20 to 10/05/20   See note below for Objective Data and Assessment of Progress/Goals.   Encounter Date: 10/05/2020   PT End of Session - 10/05/20 1357     Visit Number 50    Number of Visits 43    Date for PT Re-Evaluation 12/11/20    Authorization Type VA - 15 visits 02/05/20 - 06/04/20, 15 additional visits from 3/8-08/24/20, 15 additional visits through 10/22/20, 15 additional visits through 12/28/20    Authorization - Visit Number 4    Authorization - Number of Visits 15    Progress Note Due on Visit 54    PT Start Time 1312    PT Stop Time 1357    PT Time Calculation (min) 45 min    Equipment Utilized During Treatment Gait belt    Activity Tolerance Patient tolerated treatment well    Behavior During Therapy WFL for tasks assessed/performed             Past Medical History:  Diagnosis Date   Colon polyp    Diabetes mellitus without complication ([facility])    Diabetic neuropathy ([place])    HTN (hypertension)    Patella fracture    Renal calculi     Past Surgical History:  Procedure Laterality Date   LUMBAR LAMINECTOMY/DECOMPRESSION MICRODISCECTOMY N/A 07/01/2019   Procedure: LUMBAR LAMINECTOMY/DECOMPRESSION MICRODISCECTOMY 1 LEVEL, THORACIC SIX-SEVEN;  Surgeon: Consuella Lose, MD;  Location: [facility];  Service: Neurosurgery;  Laterality: N/A;  LUMBAR LAMINECTOMY/DECOMPRESSION MICRODISCECTOMY 1 LEVEL, THORACIC SIX-SEVEN    ORIF PATELLA     THORACIC DISCECTOMY N/A 07/03/2019   Procedure: Evacuation of Thoracic Hematoma;  Surgeon: Consuella Lose, MD;  Location: [facility];  Service: Neurosurgery;  Laterality: N/A;     There were no vitals filed for this visit.   Subjective Assessment - 10/05/20 1316     Subjective No changes since he was last here. Still doing walking and his leg exercises at home.    Pertinent History T8 paraplegia, diabetes, HTN    How long can you stand comfortably? probably a couple minutes with RW.    How long can you walk comfortably? approx. 5-10 minutes with his son and RW.    Patient Stated Goals wants to be able to walk without any assistance    Currently in Pain? No/denies                               [facility] Adult PT Treatment/Exercise - 10/05/20 1328       Transfers   Transfers Sit to Stand;Stand to Sit    Sit to Stand 4: Min guard;With upper extremity assist;From bed    Sit to Stand Details (indicate cue type and reason) x1 rep from higher mat tablr with therapist needing to steady RW    Stand to Sit 4: Min guard;With upper extremity assist    Stand to Sit Details back to mat table after bout of gait      Ambulation/Gait   Ambulation/Gait Yes    Ambulation/Gait Assistance 4: Min guard;4: Min assist;3: Mod assist    Ambulation/Gait Assistance Details cues throughout for posture, B knee extension, and  proper foot placement (esp wider with RLE to decr scissoring around turns)with PT tech with w/c follow during last lap but not needed. during 3rd lap pt more fatigued with forward flexed posture and needing min A from therapist to assist with RLE foot clearance    Ambulation Distance (Feet) 345 Feet   x1   Assistive device Rolling walker    Gait Pattern Decreased weight shift to right;Left flexed knee in stance;Right flexed knee in stance;Narrow base of support;Decreased step length - right;Decreased step length - left;Decreased stance time - right;Decreased hip/knee flexion - right;Step-through pattern    Ambulation Surface Level;Indoor      Neuro Re-ed    Neuro Re-ed Details  in quadraped: 2 x 5 reps B hip ABD side stepping with therapist  helping to stabilize at pelvis and to move RLE out, 2x 5 reps B bringing each leg forward into hip flexion and back into extension, assist as needed. seated on green balance disc alternating LAQs x10 reps B beginning with UE support > none      Exercises   Exercises Other Exercises    Other Exercises  prone: glute squeezes x10 reps, hamstring curls x10 reps B with AAROM esp with RLE                      PT Short Term Goals - 09/29/20 1351       PT SHORT TERM GOAL #1   Title Pt will improve gait speed with RW and R AFO to at least 1.18 ft/sec in order to demo improved household mobility. ALL STGS DUE 10/11/20    Baseline 1.09 ft/sec    Time 4    Period Weeks    Status New    Target Date 10/11/20      PT SHORT TERM GOAL #2   Title Pt will stand at [facility] with min guard with no UE support for at least 1 minute in order to demo improved balance for ADLs.    Baseline 32.84 seconds on 09/12/20    Time 4    Period Weeks    Status New      PT SHORT TERM GOAL #3   Title Pt will ambulate at least 230' consistently with RW and min guard throughout in order to demo improved household mobility.    Baseline 08/18/20; pt able to achieve distance with min guard to min assist for balance    Time 4    Period Weeks    Status On-going      PT SHORT TERM GOAL #4   Title Pt will decr 5x sit <> Stand time to 30 seconds or less with UE support from 20" mat table in order to demo improved functional transfers    Baseline 34.75 seconds on 09/13/20    Time 4    Period Weeks    Status New               PT Long Term Goals - 09/29/20 1351       PT LONG TERM GOAL #1   Title Pt and spouse will be independent with final HEP for BLE stretching and strengthening. ALL LTGS DUE 11/08/20    Baseline pt will continue to benefit from ongoing additions    Time 8    Period Weeks    Status On-going      PT LONG TERM GOAL #2   Title Pt will improve gait speed with RW and R AFO to at  least 1.28  ft/sec in order to demo improved household mobility.    Baseline 29.92 = 1.09 ft/sec on 09/12/20    Time 8    Period Weeks    Status Achieved      PT LONG TERM GOAL #3   Title Pt will perform 5x sit <> stand from standard height mat table with supervision in order to demo improved functional transfers.    Baseline 34.75 seconds with therapist helping to steady RW    Time 8    Period Weeks    Status New      PT LONG TERM GOAL #4   Title Pt will stand at [facility] with min guard with no UE support for at least 1 minute and 30 seconds in order to demo improved balance    Baseline 32.84 seconds on 09/12/20    Time 8    Status New      PT LONG TERM GOAL #5   Title Pt will ambulate at least 345'' over level surfaces with RW and min guard/min A in order to demo improved household mobility and improved endurance    Baseline 230' max distance in a single bout with min guard/min A    Time 8    Period Weeks    Status On-going                   Plan - 10/05/20 1551     Clinical Impression Statement 10th visit PN: Pt able to ambulate 345' consecutively with gait today needing min A during the last lap to help with RLE foot clearance due to fatigue and pt with more flexed posture. Pt with more narrow BOS with gait, esp going around turns and needing verbal cues for foot placement with RLE for more wide BOS. Pt able to stand from mat table with min guard with therapist just needing to stabilize RW today. Performed quadraped today for proximal hip/core strengthening with pt needing intermittent rest breaks between activities due to fatigue. Will continue to progress towards LTGs.    Personal Factors and Comorbidities Comorbidity 3+;Past/Current Experience    Comorbidities T8 paraplegia, diabetes, HTN    Examination-Activity Limitations Locomotion Level;Transfers;Stand    Examination-Participation Restrictions Community Activity;Yard Work   Community education officer  Evolving/Moderate complexity    Rehab Potential Good    PT Frequency 2x / week   1-2x   PT Duration 12 weeks   15 visits over 12 weeks   PT Treatment/Interventions Aquatic Therapy;Manual techniques;Therapeutic exercise;Gait training;Neuromuscular re-education;Orthotic Fit/Training    PT Next Visit Plan review HEP and update/progress as appropriate (add bridging to HEP). continue with incr gait distance, SciFit for strength/endurance, standing balance. quadraped/tall kneeling.    PT Home Exercise Plan Access Code: [email or web address]    Consulted and Agree with Plan of Care Patient             Patient will benefit from skilled therapeutic intervention in order to improve the following deficits and impairments:  Decreased balance, Decreased coordination, Decreased range of motion, Decreased strength, Decreased mobility, Postural dysfunction, Impaired tone, Decreased activity tolerance, Abnormal gait, Decreased endurance, Difficulty walking, Impaired sensation  Visit Diagnosis: Muscle weakness (generalized)  Unsteadiness on feet  Other symptoms and signs involving the nervous system  Difficulty in walking, not elsewhere classified     Problem List Patient Active Problem List   Diagnosis Date Noted   Erectile dysfunction due to diseases classified elsewhere 03/02/2020   Wheelchair dependence 11/27/2019  Spasticity 08/03/2019   Paraplegia ([place]) 07/07/2019   Neurogenic bowel 07/07/2019   Neurogenic bladder 07/07/2019   Thoracic myelopathy 06/30/2019    Arliss Journey, PT, DPT  10/05/2020, 3:54 PM  [facility] [address] [place], [name], [address] Phone: [phone]   Fax:  [phone]  Name: JACQUISE HALPIN MRN: [id number] Date of Birth: Mar 16, 1950

## 2020-10-10 ENCOUNTER — Other Ambulatory Visit: Payer: Self-pay

## 2020-10-10 ENCOUNTER — Ambulatory Visit: Payer: No Typology Code available for payment source | Admitting: Physical Therapy

## 2020-10-10 DIAGNOSIS — M6281 Muscle weakness (generalized): Secondary | ICD-10-CM

## 2020-10-10 DIAGNOSIS — R262 Difficulty in walking, not elsewhere classified: Secondary | ICD-10-CM | POA: Diagnosis not present

## 2020-10-10 DIAGNOSIS — R2681 Unsteadiness on feet: Secondary | ICD-10-CM

## 2020-10-11 NOTE — Therapy (Signed)
[facility] [address] [place], [name], [address] Phone: [phone]   Fax:  [phone]  Physical Therapy Treatment  Patient Details  Name: Timothy Davenport MRN: [id number] Date of Birth: 1950/02/26 Referring Provider (PT): Juel Burrow    Encounter Date: 10/10/2020   PT End of Session - 10/11/20 1219     Visit Number 51    Number of Visits 49    Date for PT Re-Evaluation 12/11/20    Authorization Type VA - 15 visits 02/05/20 - 06/04/20, 15 additional visits from 3/8-08/24/20, 15 additional visits through 10/22/20, 15 additional visits through 12/28/20    Authorization - Visit Number 5    Authorization - Number of Visits 15    Progress Note Due on Visit 89    PT Start Time 1415    PT Stop Time 1500    PT Time Calculation (min) 45 min    Equipment Utilized During Treatment Other (comment)   water walker, ankle cuffs, pool noodle   Activity Tolerance Patient tolerated treatment well    Behavior During Therapy [facility] for tasks assessed/performed             Past Medical History:  Diagnosis Date   Colon polyp    Diabetes mellitus without complication ([place])    Diabetic neuropathy ([place])    HTN (hypertension)    Patella fracture    Renal calculi     Past Surgical History:  Procedure Laterality Date   LUMBAR LAMINECTOMY/DECOMPRESSION MICRODISCECTOMY N/A 07/01/2019   Procedure: LUMBAR LAMINECTOMY/DECOMPRESSION MICRODISCECTOMY 1 LEVEL, THORACIC SIX-SEVEN;  Surgeon: Consuella Lose, MD;  Location: [facility];  Service: Neurosurgery;  Laterality: N/A;  LUMBAR LAMINECTOMY/DECOMPRESSION MICRODISCECTOMY 1 LEVEL, THORACIC SIX-SEVEN    ORIF PATELLA     THORACIC DISCECTOMY N/A 07/03/2019   Procedure: Evacuation of Thoracic Hematoma;  Surgeon: Consuella Lose, MD;  Location: [facility];  Service: Neurosurgery;  Laterality: N/A;    There were no vitals filed for this visit.   Subjective Assessment - 10/11/20 1218      Subjective Pt presents for aquatic therapy at [facility] - states he feels he is getting stronger    Pertinent History T8 paraplegia, diabetes, HTN    How long can you stand comfortably? probably a couple minutes with RW.    How long can you walk comfortably? approx. 5-10 minutes with his son and RW.    Patient Stated Goals wants to be able to walk without any assistance    Currently in Pain? No/denies                  Aquatic therapy at [facility] - pool temp 98 degrees   Patient seen for aquatic therapy today.  Treatment took place in water 3.6-4.5 feet deep depending upon activity.   Pt entered the pool via hydraulic chair lift; pt was transferred from wheelchair to/from chair lift with set up - lateral scoot transfer.   Pt performed standing hamstring/heel cord stretch (runner's stretch) 30 sec hold x 1 rep each leg at beginning of session   Pt performed standing balance exercise for core stabilization - pushed bar bell forward, pulled back with min assist 10 reps  Pt standing at pool edge holding edge with bil UE assist.  Performed bil LE squats 10 reps; unilateral squats 10 reps each with min assist for balance   Pt performed gait training with use of water walker initially for bil. UE support with min assist for 18' x 4  reps; progressed to use of large bar bell with bil UE support with min to mod assist with cues for upright posture - 18' x approx. 8 reps across width of pool   Pt in supine position with neck noodle, waist floatation and pool noodle.  Hip abd/add x 10 reps x 2 sets:  bicycling LE's x 10 reps x 2 sets;  PT pushing pt into hip and knee flexion with bil feet on pool wall and pt pushing off into hip and knee extension x 10 reps for closed chain strengthening  Pt performed standing hip flexion, extension, abduction 10 reps each with use of aquatic cuff with min to mod assist for standing balance  Pt requires buoyancy of water for support and for joint  offloading; standing activities are much safer in water with reduced fall risk compared to same activities performed on land.  Viscosity of water needed for resistance for strengthening bil. LE's.  Bouyancy supported exercises also assist with spasticity reduction.                             PT Short Term Goals - 10/11/20 1229       PT SHORT TERM GOAL #1   Title Pt will improve gait speed with RW and R AFO to at least 1.18 ft/sec in order to demo improved household mobility. ALL STGS DUE 10/11/20    Baseline 1.09 ft/sec    Time 4    Period Weeks    Status New    Target Date 10/11/20      PT SHORT TERM GOAL #2   Title Pt will stand at [facility] with min guard with no UE support for at least 1 minute in order to demo improved balance for ADLs.    Baseline 32.84 seconds on 09/12/20    Time 4    Period Weeks    Status New      PT SHORT TERM GOAL #3   Title Pt will ambulate at least 230' consistently with RW and min guard throughout in order to demo improved household mobility.    Baseline 08/18/20; pt able to achieve distance with min guard to min assist for balance    Time 4    Period Weeks    Status On-going      PT SHORT TERM GOAL #4   Title Pt will decr 5x sit <> Stand time to 30 seconds or less with UE support from 20" mat table in order to demo improved functional transfers    Baseline 34.75 seconds on 09/13/20    Time 4    Period Weeks    Status New               PT Long Term Goals - 10/11/20 1229       PT LONG TERM GOAL #1   Title Pt and spouse will be independent with final HEP for BLE stretching and strengthening. ALL LTGS DUE 11/08/20    Baseline pt will continue to benefit from ongoing additions    Time 8    Period Weeks    Status On-going      PT LONG TERM GOAL #2   Title Pt will improve gait speed with RW and R AFO to at least 1.28 ft/sec in order to demo improved household mobility.    Baseline 29.92 = 1.09 ft/sec on 09/12/20    Time 8     Period Weeks  Status Achieved      PT LONG TERM GOAL #3   Title Pt will perform 5x sit <> stand from standard height mat table with supervision in order to demo improved functional transfers.    Baseline 34.75 seconds with therapist helping to steady RW    Time 8    Period Weeks    Status New      PT LONG TERM GOAL #4   Title Pt will stand at [facility] with min guard with no UE support for at least 1 minute and 30 seconds in order to demo improved balance    Baseline 32.84 seconds on 09/12/20    Time 8    Status New      PT LONG TERM GOAL #5   Title Pt will ambulate at least 345'' over level surfaces with RW and min guard/min A in order to demo improved household mobility and improved endurance    Baseline 230' max distance in a single bout with min guard/min A    Time 8    Period Weeks    Status On-going                   Plan - 10/11/20 1221     Clinical Impression Statement Aquatic therapy session focused on LE strengthening and gait and balance training.  Pt improved with gait training with initial use of water walker for maximum stability, progressing to use of large bar bell with gait belt with min assist and cues for erect posture.  Pt's legs were tremoring after approx. half of session due to fatigue, but pt able to continue with exercises with blocking Rt knee when pt performing LLE standing hip exercises.  Cont with POC.    Personal Factors and Comorbidities Comorbidity 3+;Past/Current Experience    Comorbidities T8 paraplegia, diabetes, HTN    Examination-Activity Limitations Locomotion Level;Transfers;Stand    Examination-Participation Restrictions Community Activity;Yard Work   Community education officer Evolving/Moderate complexity    Rehab Potential Good    PT Frequency 2x / week   1-2x   PT Duration 12 weeks   15 visits over 12 weeks   PT Treatment/Interventions Aquatic Therapy;Manual techniques;Therapeutic exercise;Gait  training;Neuromuscular re-education;Orthotic Fit/Training    PT Next Visit Plan review HEP and update/progress as appropriate (add bridging to HEP). continue with incr gait distance, SciFit for strength/endurance, standing balance. quadraped/tall kneeling.    PT Home Exercise Plan Access Code: [email or web address]    Consulted and Agree with Plan of Care Patient             Patient will benefit from skilled therapeutic intervention in order to improve the following deficits and impairments:  Decreased balance, Decreased coordination, Decreased range of motion, Decreased strength, Decreased mobility, Postural dysfunction, Impaired tone, Decreased activity tolerance, Abnormal gait, Decreased endurance, Difficulty walking, Impaired sensation  Visit Diagnosis: Difficulty in walking, not elsewhere classified  Muscle weakness (generalized)  Unsteadiness on feet     Problem List Patient Active Problem List   Diagnosis Date Noted   Erectile dysfunction due to diseases classified elsewhere 03/02/2020   Wheelchair dependence 11/27/2019   Spasticity 08/03/2019   Paraplegia ([place]) 07/07/2019   Neurogenic bowel 07/07/2019   Neurogenic bladder 07/07/2019   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 [place], [name], [address] Phone: [phone]   Fax:  [phone]  Name: Timothy Davenport MRN: [id number] Date  of Birth: Oct 14, 1950

## 2020-10-12 ENCOUNTER — Encounter: Payer: Self-pay | Admitting: Physical Therapy

## 2020-10-12 ENCOUNTER — Ambulatory Visit: Payer: No Typology Code available for payment source | Admitting: Physical Therapy

## 2020-10-12 ENCOUNTER — Other Ambulatory Visit: Payer: Self-pay

## 2020-10-12 DIAGNOSIS — R2681 Unsteadiness on feet: Secondary | ICD-10-CM

## 2020-10-12 DIAGNOSIS — M6281 Muscle weakness (generalized): Secondary | ICD-10-CM

## 2020-10-12 DIAGNOSIS — R29818 Other symptoms and signs involving the nervous system: Secondary | ICD-10-CM

## 2020-10-12 DIAGNOSIS — R262 Difficulty in walking, not elsewhere classified: Secondary | ICD-10-CM

## 2020-10-12 NOTE — Therapy (Signed)
[facility] [address] [place], [name], [address] Phone: [phone]   Fax:  [phone]  Physical Therapy Treatment  Patient Details  Name: JAKOLBY SEDIVY MRN: [id number] Date of Birth: [birth year] Referring Provider (PT): Juel Burrow    Encounter Date: 10/12/2020   PT End of Session - 10/12/20 1154     Visit Number 52    Number of Visits 29    Date for PT Re-Evaluation 12/11/20    Authorization Type VA - 15 visits 02/05/20 - 06/04/20, 15 additional visits from 3/8-08/24/20, 15 additional visits through 10/22/20, 15 additional visits through 12/28/20    Authorization - Visit Number 6    Authorization - Number of Visits 15    Progress Note Due on Visit 54    PT Start Time 1102    PT Stop Time 1144    PT Time Calculation (min) 42 min    Equipment Utilized During Treatment Gait belt    Activity Tolerance Patient tolerated treatment well    Behavior During Therapy WFL for tasks assessed/performed             Past Medical History:  Diagnosis Date   Colon polyp    Diabetes mellitus without complication ([place])    Diabetic neuropathy ([place])    HTN (hypertension)    Patella fracture    Renal calculi     Past Surgical History:  Procedure Laterality Date   LUMBAR LAMINECTOMY/DECOMPRESSION MICRODISCECTOMY N/A 07/01/2019   Procedure: LUMBAR LAMINECTOMY/DECOMPRESSION MICRODISCECTOMY 1 LEVEL, THORACIC SIX-SEVEN;  Surgeon: Consuella Lose, MD;  Location: [facility];  Service: Neurosurgery;  Laterality: N/A;  LUMBAR LAMINECTOMY/DECOMPRESSION MICRODISCECTOMY 1 LEVEL, THORACIC SIX-SEVEN    ORIF PATELLA     THORACIC DISCECTOMY N/A 07/03/2019   Procedure: Evacuation of Thoracic Hematoma;  Surgeon: Consuella Lose, MD;  Location: [facility];  Service: Neurosurgery;  Laterality: N/A;    There were no vitals filed for this visit.   Subjective Assessment - 10/12/20 1104     Subjective Doing well. Feels like he is going stronger.  Still waiting to hear from the [place] about a toe cap.    Pertinent History T8 paraplegia, diabetes, HTN    How long can you stand comfortably? probably a couple minutes with RW.    How long can you walk comfortably? approx. 5-10 minutes with his son and RW.    Patient Stated Goals wants to be able to walk without any assistance    Currently in Pain? No/denies                               [facility] Adult PT Treatment/Exercise - 10/12/20 1111       Transfers   Transfers Sit to Stand;Stand to Sit    Sit to Stand 4: Min guard;With upper extremity assist;From bed    Sit to Stand Details (indicate cue type and reason) from 20" height mat table    Five time sit to stand comments  18.43 seconds, PT helping to steady RW, pt using single UE support on RW and on mat table    Stand to Sit 4: Min guard;With upper extremity assist    Stand to Sit Details back to mat table    Comments needs min guard when turning with RW to sit back down to mat table      Ambulation/Gait   Ambulation/Gait Yes    Ambulation/Gait Assistance 4: Min guard;4: Min assist  Ambulation/Gait Assistance Details cues for posture, B knee extension during stance, wider BOS with RLE (esp around turns), pt with incr forward flexed posture at end of bout    Ambulation Distance (Feet) 150 Feet    Assistive device Rolling walker    Gait Pattern Decreased weight shift to right;Left flexed knee in stance;Right flexed knee in stance;Narrow base of support;Decreased step length - right;Decreased step length - left;Decreased stance time - right;Decreased hip/knee flexion - right;Step-through pattern    Ambulation Surface Level;Indoor    Gait velocity 27.03 seconds = 1.21 ft/sec      High Level Balance   High Level Balance Comments standing at RW without UE support: 43.12 seconds with min guard for balance, cues for glute/quad activation and posture      Neuro Re-ed    Neuro Re-ed Details  in tall kneeling(needing min  guard to get in and out of position): with use of kaye bench 2 x 5 reps mini squats with cues to use legs vs. BUEs - cues for technique and for glute activation/posture at end range, holding in tall kneel alternating UE lifts x5 reps B - cues for posture, then performed x4 reps B bringing legs forwards for hip flexor activation and then backwards for hip extensor activation - pt fatigued easily with this activity      Exercises   Exercises Other Exercises    Other Exercises  seated LAQs: x10 reps with 2# weight for LLE, 2 x 10 reps with RLE with no ankle weight through pt's range                    PT Education - 10/12/20 1154     Education Details progress towards goals    Person(s) Educated Patient    Methods Explanation    Comprehension Verbalized understanding              PT Short Term Goals - 10/12/20 1112       PT SHORT TERM GOAL #1   Title Pt will improve gait speed with RW and R AFO to at least 1.18 ft/sec in order to demo improved household mobility. ALL STGS DUE 10/11/20    Baseline 1.09 ft/sec; 1.21 ft/sec on 10/12/20    Time 4    Period Weeks    Status Achieved    Target Date 10/11/20      PT SHORT TERM GOAL #2   Title Pt will stand at [facility] with min guard with no UE support for at least 1 minute in order to demo improved balance for ADLs.    Baseline 32.84 seconds on 09/12/20; standing at RW without UE support: 43.12 seconds with min guard for balance on 10/12/20    Time 4    Period Weeks    Status Not Met      PT SHORT TERM GOAL #3   Title Pt will ambulate at least 230' consistently with RW and min guard throughout in order to demo improved household mobility.    Baseline 08/18/20; pt able to achieve distance with min guard to min assist for balance    Time 4    Period Weeks    Status On-going      PT SHORT TERM GOAL #4   Title Pt will decr 5x sit <> Stand time to 30 seconds or less with UE support from 20" mat table in order to demo improved functional  transfers    Baseline 34.75 seconds on 09/13/20; 18.43 seconds  on 10/12/20    Time 4    Period Weeks    Status Achieved               PT Long Term Goals - 10/11/20 1229       PT LONG TERM GOAL #1   Title Pt and spouse will be independent with final HEP for BLE stretching and strengthening. ALL LTGS DUE 11/08/20    Baseline pt will continue to benefit from ongoing additions    Time 8    Period Weeks    Status On-going      PT LONG TERM GOAL #2   Title Pt will improve gait speed with RW and R AFO to at least 1.28 ft/sec in order to demo improved household mobility.    Baseline 29.92 = 1.09 ft/sec on 09/12/20    Time 8    Period Weeks    Status Achieved      PT LONG TERM GOAL #3   Title Pt will perform 5x sit <> stand from standard height mat table with supervision in order to demo improved functional transfers.    Baseline 34.75 seconds with therapist helping to steady RW    Time 8    Period Weeks    Status New      PT LONG TERM GOAL #4   Title Pt will stand at [facility] with min guard with no UE support for at least 1 minute and 30 seconds in order to demo improved balance    Baseline 32.84 seconds on 09/12/20    Time 8    Status New      PT LONG TERM GOAL #5   Title Pt will ambulate at least 345'' over level surfaces with RW and min guard/min A in order to demo improved household mobility and improved endurance    Baseline 230' max distance in a single bout with min guard/min A    Time 8    Period Weeks    Status On-going                   Plan - 10/12/20 1156     Clinical Impression Statement Checked pt's STGs today with pt meeting STG #1 and #4. Pt improved gait speed with RW and R AFO to 1.21 ft/sec (previously 1.09 ft/sec). Pt improved 5x sit <> stand time from mat table in 18.43 seconds (still needing therapist to help steady RW). Pt did not meet STG #2 - able to improve standing time without UE support at RW to 43.12 seconds, but not to goal level. Remainder  of session focused on proximal hip strength/balance in tall kneeling position with pt needing intermittent rest breaks due to fatigue. Will continue to progress towards LTGs.    Personal Factors and Comorbidities Comorbidity 3+;Past/Current Experience    Comorbidities T8 paraplegia, diabetes, HTN    Examination-Activity Limitations Locomotion Level;Transfers;Stand    Examination-Participation Restrictions Community Activity;Yard Work   Community education officer Evolving/Moderate complexity    Rehab Potential Good    PT Frequency 2x / week   1-2x   PT Duration 12 weeks   15 visits over 12 weeks   PT Treatment/Interventions Aquatic Therapy;Manual techniques;Therapeutic exercise;Gait training;Neuromuscular re-education;Orthotic Fit/Training    PT Next Visit Plan try step ups and side stepping in // bars. continue with incr gait distance, SciFit for strength/endurance, standing balance. quadraped/tall kneeling.    PT Home Exercise Plan Access Code: [email or web address]    [id number] and Agree with  Plan of Care Patient             Patient will benefit from skilled therapeutic intervention in order to improve the following deficits and impairments:  Decreased balance, Decreased coordination, Decreased range of motion, Decreased strength, Decreased mobility, Postural dysfunction, Impaired tone, Decreased activity tolerance, Abnormal gait, Decreased endurance, Difficulty walking, Impaired sensation  Visit Diagnosis: Difficulty in walking, not elsewhere classified  Muscle weakness (generalized)  Other symptoms and signs involving the nervous system  Unsteadiness on feet     Problem List Patient Active Problem List   Diagnosis Date Noted   Erectile dysfunction due to diseases classified elsewhere 03/02/2020   Wheelchair dependence 11/27/2019   Spasticity 08/03/2019   Paraplegia ([place]) 07/07/2019   Neurogenic bowel 07/07/2019   Neurogenic bladder 07/07/2019   Thoracic  myelopathy 06/30/2019    Arliss Journey, PT, DPT  10/12/2020, 11:58 AM  [facility] [address] [place], [name], [address] Phone: [phone]   Fax:  [phone]  Name: DAVIAN HANSHAW MRN: [id number] Date of Birth: May 25, 1950

## 2020-10-17 ENCOUNTER — Other Ambulatory Visit: Payer: Self-pay

## 2020-10-17 ENCOUNTER — Ambulatory Visit: Payer: BC Managed Care – PPO | Admitting: Physical Therapy

## 2020-10-17 DIAGNOSIS — M6281 Muscle weakness (generalized): Secondary | ICD-10-CM

## 2020-10-17 DIAGNOSIS — R262 Difficulty in walking, not elsewhere classified: Secondary | ICD-10-CM

## 2020-10-17 DIAGNOSIS — R2681 Unsteadiness on feet: Secondary | ICD-10-CM

## 2020-10-18 NOTE — Therapy (Signed)
[facility] [address] [place], [name], [address] Phone: [phone]   Fax:  [phone]  Physical Therapy Treatment  Patient Details  Name: Timothy Davenport MRN: [id number] Date of Birth: 04-17-1950 Referring Provider (PT): Juel Burrow    Encounter Date: 10/17/2020   PT End of Session - 10/18/20 1310     Visit Number 48    Number of Visits 19    Date for PT Re-Evaluation 12/11/20    Authorization Type VA - 15 visits 02/05/20 - 06/04/20, 15 additional visits from 3/8-08/24/20, 15 additional visits through 10/22/20, 15 additional visits through 12/28/20    Authorization - Visit Number 7    Authorization - Number of Visits 15    Progress Note Due on Visit 10    PT Start Time 9675    PT Stop Time 1505    PT Time Calculation (min) 45 min    Equipment Utilized During Treatment Gait belt;Other (comment)   pool noodle, gait belt, buoyant ankle cuffs   Activity Tolerance Patient tolerated treatment well    Behavior During Therapy WFL for tasks assessed/performed             Past Medical History:  Diagnosis Date   Colon polyp    Diabetes mellitus without complication (HCC)    Diabetic neuropathy (HCC)    HTN (hypertension)    Patella fracture    Renal calculi     Past Surgical History:  Procedure Laterality Date   LUMBAR LAMINECTOMY/DECOMPRESSION MICRODISCECTOMY N/A 07/01/2019   Procedure: LUMBAR LAMINECTOMY/DECOMPRESSION MICRODISCECTOMY 1 LEVEL, THORACIC SIX-SEVEN;  Surgeon: Consuella Lose, MD;  Location: [facility];  Service: Neurosurgery;  Laterality: N/A;  LUMBAR LAMINECTOMY/DECOMPRESSION MICRODISCECTOMY 1 LEVEL, THORACIC SIX-SEVEN    ORIF PATELLA     THORACIC DISCECTOMY N/A 07/03/2019   Procedure: Evacuation of Thoracic Hematoma;  Surgeon: Consuella Lose, MD;  Location: [facility];  Service: Neurosurgery;  Laterality: N/A;    There were no vitals filed for this visit.   Subjective Assessment - 10/18/20 1308      Subjective Pt reports he is doing well; agrees to trying to walk down steps rather than using chair lift today    Pertinent History T8 paraplegia, diabetes, HTN    How long can you stand comfortably? probably a couple minutes with RW.    How long can you walk comfortably? approx. 5-10 minutes with his son and RW.    Patient Stated Goals wants to be able to walk without any assistance    Currently in Pain? No/denies                   Aquatic therapy at [facility] - pool temp 92 degrees   Patient seen for aquatic therapy today.  Treatment took place in water 3.6-4.5 feet deep depending upon activity.   Pt entered the pool via step negotiation with use of bil. hand rails with min to mod assist with descension; used steps to exit pool - mod to max assist for lifting each LE onto step, especially the final 3 steps as pt was fatigued (7 steps total in pool).   Pt performed standing hamstring/heel cord stretch (runner's stretch) 30 sec hold x 1 rep each leg at beginning of session   Pt performed marching in place 10 reps each leg - holding onto pool edge prn for assist with balance  Pt standing at pool edge holding edge with bil UE assist.  Performed bil LE squats 10 reps;  unilateral squats 10 reps each with min assist for balance   Pt performed gait training with use of large yellow noodle for bil. UE support with min assist for 18' x approx. 8 reps across pool  Pt performed step ups with use of aquatic step in 4.0' water depth - 10 reps each leg with min assist with UE support on pool edge  Pt in supine position with aquatic cuffs on each leg and with use of pool noodle.  Hip abd/add x 10 reps x 2 sets:  bicycling LE's x 10 reps x 2 sets  Pt performed standing hip flexion, extension, abduction 10 reps each with use of aquatic cuff with bil. UE support on pool edge - CGA for safety but pt had no LOB  Pt requires buoyancy of water for support and for joint offloading;  standing activities are much safer in water with reduced fall risk compared to same activities performed on land.  Viscosity of water needed for resistance for strengthening bil. LE's.  Bouyancy supported exercises also assist with spasticity reduction.                             PT Short Term Goals - 10/18/20 1321       PT SHORT TERM GOAL #1   Title Pt will improve gait speed with RW and R AFO to at least 1.18 ft/sec in order to demo improved household mobility. ALL STGS DUE 10/11/20    Baseline 1.09 ft/sec; 1.21 ft/sec on 10/12/20    Time 4    Period Weeks    Status Achieved    Target Date 10/11/20      PT SHORT TERM GOAL #2   Title Pt will stand at [facility] with min guard with no UE support for at least 1 minute in order to demo improved balance for ADLs.    Baseline 32.84 seconds on 09/12/20; standing at RW without UE support: 43.12 seconds with min guard for balance on 10/12/20    Time 4    Period Weeks    Status Not Met      PT SHORT TERM GOAL #3   Title Pt will ambulate at least 230' consistently with RW and min guard throughout in order to demo improved household mobility.    Baseline 08/18/20; pt able to achieve distance with min guard to min assist for balance    Time 4    Period Weeks    Status On-going      PT SHORT TERM GOAL #4   Title Pt will decr 5x sit <> Stand time to 30 seconds or less with UE support from 20" mat table in order to demo improved functional transfers    Baseline 34.75 seconds on 09/13/20; 18.43 seconds on 10/12/20    Time 4    Period Weeks    Status Achieved               PT Long Term Goals - 10/18/20 1322       PT LONG TERM GOAL #1   Title Pt and spouse will be independent with final HEP for BLE stretching and strengthening. ALL LTGS DUE 11/08/20    Baseline pt will continue to benefit from ongoing additions    Time 8    Period Weeks    Status On-going      PT LONG TERM GOAL #2   Title Pt will improve gait speed with  RW  and R AFO to at least 1.28 ft/sec in order to demo improved household mobility.    Baseline 29.92 = 1.09 ft/sec on 09/12/20    Time 8    Period Weeks    Status Achieved      PT LONG TERM GOAL #3   Title Pt will perform 5x sit <> stand from standard height mat table with supervision in order to demo improved functional transfers.    Baseline 34.75 seconds with therapist helping to steady RW    Time 8    Period Weeks    Status New      PT LONG TERM GOAL #4   Title Pt will stand at [facility] with min guard with no UE support for at least 1 minute and 30 seconds in order to demo improved balance    Baseline 32.84 seconds on 09/12/20    Time 8    Status New      PT LONG TERM GOAL #5   Title Pt will ambulate at least 345'' over level surfaces with RW and min guard/min A in order to demo improved household mobility and improved endurance    Baseline 230' max distance in a single bout with min guard/min A    Time 8    Period Weeks    Status On-going                   Plan - 10/18/20 1312     Clinical Impression Statement Pt entered and exited pool via step negotiation for first time today rather than using chair lift. Pt required min to mod assist with descension and mod to max assist with ascension due to fatigue.  Pt able to perform water walking with use of large noodle rather than with large barbell, demonstrating improved balance (min to CGA).  Cont with POC.    Personal Factors and Comorbidities Comorbidity 3+;Past/Current Experience    Comorbidities T8 paraplegia, diabetes, HTN    Examination-Activity Limitations Locomotion Level;Transfers;Stand    Examination-Participation Restrictions Community Activity;Yard Work   Community education officer Evolving/Moderate complexity    Rehab Potential Good    PT Frequency 2x / week   1-2x   PT Duration 12 weeks   15 visits over 12 weeks   PT Treatment/Interventions Aquatic Therapy;Manual techniques;Therapeutic  exercise;Gait training;Neuromuscular re-education;Orthotic Fit/Training    PT Next Visit Plan try step ups and side stepping in // bars. continue with incr gait distance, SciFit for strength/endurance, standing balance. quadraped/tall kneeling.    PT Home Exercise Plan Access Code: [email or web address]    Consulted and Agree with Plan of Care Patient             Patient will benefit from skilled therapeutic intervention in order to improve the following deficits and impairments:  Decreased balance, Decreased coordination, Decreased range of motion, Decreased strength, Decreased mobility, Postural dysfunction, Impaired tone, Decreased activity tolerance, Abnormal gait, Decreased endurance, Difficulty walking, Impaired sensation  Visit Diagnosis: Difficulty in walking, not elsewhere classified  Muscle weakness (generalized)  Unsteadiness on feet     Problem List Patient Active Problem List   Diagnosis Date Noted   Erectile dysfunction due to diseases classified elsewhere 03/02/2020   Wheelchair dependence 11/27/2019   Spasticity 08/03/2019   Paraplegia ([place]) 07/07/2019   Neurogenic bowel 07/07/2019   Neurogenic bladder 07/07/2019   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

## 2020-10-19 ENCOUNTER — Ambulatory Visit: Payer: No Typology Code available for payment source | Admitting: Physical Therapy

## 2020-10-19 ENCOUNTER — Encounter: Payer: Self-pay | Admitting: Physical Therapy

## 2020-10-19 ENCOUNTER — Other Ambulatory Visit: Payer: Self-pay

## 2020-10-19 DIAGNOSIS — R262 Difficulty in walking, not elsewhere classified: Secondary | ICD-10-CM

## 2020-10-19 DIAGNOSIS — R29818 Other symptoms and signs involving the nervous system: Secondary | ICD-10-CM

## 2020-10-19 DIAGNOSIS — M6281 Muscle weakness (generalized): Secondary | ICD-10-CM

## 2020-10-19 DIAGNOSIS — R2681 Unsteadiness on feet: Secondary | ICD-10-CM

## 2020-10-19 NOTE — Therapy (Addendum)
[facility] [address] [place], [name], [address] Phone: [phone]   Fax:  [phone]  Physical Therapy Treatment  Patient Details  Name: Timothy Davenport MRN: [id number] Date of Birth: 1950-06-23 Referring Provider (PT): Juel Burrow    Encounter Date: 10/19/2020   PT End of Session - 10/19/20 1109     Visit Number 54    Number of Visits 13    Date for PT Re-Evaluation 12/11/20    Authorization Type VA - 15 visits 02/05/20 - 06/04/20, 15 additional visits from 3/8-08/24/20, 15 additional visits through 10/22/20, 15 additional visits through 12/28/20    Authorization - Visit Number 8    Authorization - Number of Visits 15    Progress Note Due on Visit 50    PT Start Time 1104    PT Stop Time 1145    PT Time Calculation (min) 41 min    Equipment Utilized During Treatment Gait belt;Other (comment)   pool noodle, gait belt, buoyant ankle cuffs   Activity Tolerance Patient tolerated treatment well    Behavior During Therapy WFL for tasks assessed/performed             Past Medical History:  Diagnosis Date   Colon polyp    Diabetes mellitus without complication (HCC)    Diabetic neuropathy (HCC)    HTN (hypertension)    Patella fracture    Renal calculi     Past Surgical History:  Procedure Laterality Date   LUMBAR LAMINECTOMY/DECOMPRESSION MICRODISCECTOMY N/A 07/01/2019   Procedure: LUMBAR LAMINECTOMY/DECOMPRESSION MICRODISCECTOMY 1 LEVEL, THORACIC SIX-SEVEN;  Surgeon: Consuella Lose, MD;  Location: [facility];  Service: Neurosurgery;  Laterality: N/A;  LUMBAR LAMINECTOMY/DECOMPRESSION MICRODISCECTOMY 1 LEVEL, THORACIC SIX-SEVEN    ORIF PATELLA     THORACIC DISCECTOMY N/A 07/03/2019   Procedure: Evacuation of Thoracic Hematoma;  Surgeon: Consuella Lose, MD;  Location: [facility];  Service: Neurosurgery;  Laterality: N/A;    There were no vitals filed for this visit.   Subjective Assessment - 10/19/20 1108      Subjective No new complaints. No falls or pain to report.    Pertinent History T8 paraplegia, diabetes, HTN    How long can you stand comfortably? probably a couple minutes with RW.    How long can you walk comfortably? approx. 5-10 minutes with his son and RW.    Patient Stated Goals wants to be able to walk without any assistance    Currently in Pain? No/denies    Pain Score 0-No pain                    [facility] Adult PT Treatment/Exercise - 10/19/20 1110       Transfers   Transfers Sit to Stand;Stand to Sit    Sit to Stand 4: Min guard;With upper extremity assist;From bed    Sit to Stand Details (indicate cue type and reason) from low mat to RW x1, chair to parallel bars x4  reps    Stand to Sit 4: Min guard;With upper extremity assist    Stand to Sit Details pt able to use UE's to control descent each time      Ambulation/Gait   Ambulation/Gait Yes    Ambulation/Gait Assistance 4: Min guard;4: Min assist    Ambulation/Gait Assistance Details cues for posture, hip/knee extension in stance phase and increased step length bil LE's.    Ambulation Distance (Feet) 115 Feet   x1   Assistive device Rolling walker  Gait Pattern Decreased weight shift to right;Left flexed knee in stance;Right flexed knee in stance;Narrow base of support;Decreased step length - right;Decreased step length - left;Decreased stance time - right;Decreased hip/knee flexion - right;Step-through pattern    Ambulation Surface Level;Indoor      High Level Balance   High Level Balance Activities Side stepping;Other (comment)    High Level Balance Comments in parallel bars: standign in place alternating stepping out laterally/back in for 2 sets of 5 reps each side with min guard assist. progressed to side stepping left<>right in parallel bars with bil UE support for 2 laps each way. min guard to min assist for balance with cues for hip/knee extension and to lift foot/not slide it along.      Knee/Hip  Exercises: Aerobic   Other Aerobic Scifit level 3.5 wtih LE's only at seat 22 (occasional UE assist on with arm component set at number 7) x 8 minutes with goal >/= 70 steps per minute for strengthening and activity tolerance.      Knee/Hip Exercises: Standing   Forward Step Up Both;2 sets;5 reps;Hand Hold: 2;Step Height: 4";Limitations    Forward Step Up Limitations in parallel bars with bil UE support- assist needed at times to lift right foot onto step and/or off step. cues on technique, for increased LE use/less UE use and posture. min guard to min assist.                      PT Short Term Goals - 10/18/20 1321       PT SHORT TERM GOAL #1   Title Pt will improve gait speed with RW and R AFO to at least 1.18 ft/sec in order to demo improved household mobility. ALL STGS DUE 10/11/20    Baseline 1.09 ft/sec; 1.21 ft/sec on 10/12/20    Time 4    Period Weeks    Status Achieved    Target Date 10/11/20      PT SHORT TERM GOAL #2   Title Pt will stand at [facility] with min guard with no UE support for at least 1 minute in order to demo improved balance for ADLs.    Baseline 32.84 seconds on 09/12/20; standing at RW without UE support: 43.12 seconds with min guard for balance on 10/12/20    Time 4    Period Weeks    Status Not Met      PT SHORT TERM GOAL #3   Title Pt will ambulate at least 230' consistently with RW and min guard throughout in order to demo improved household mobility.    Baseline 08/18/20; pt able to achieve distance with min guard to min assist for balance    Time 4    Period Weeks    Status On-going      PT SHORT TERM GOAL #4   Title Pt will decr 5x sit <> Stand time to 30 seconds or less with UE support from 20" mat table in order to demo improved functional transfers    Baseline 34.75 seconds on 09/13/20; 18.43 seconds on 10/12/20    Time 4    Period Weeks    Status Achieved               PT Long Term Goals - 10/18/20 1322       PT LONG TERM GOAL #1    Title Pt and spouse will be independent with final HEP for BLE stretching and strengthening. ALL LTGS DUE 11/08/20  Baseline pt will continue to benefit from ongoing additions    Time 8    Period Weeks    Status On-going      PT LONG TERM GOAL #2   Title Pt will improve gait speed with RW and R AFO to at least 1.28 ft/sec in order to demo improved household mobility.    Baseline 29.92 = 1.09 ft/sec on 09/12/20    Time 8    Period Weeks    Status Achieved      PT LONG TERM GOAL #3   Title Pt will perform 5x sit <> stand from standard height mat table with supervision in order to demo improved functional transfers.    Baseline 34.75 seconds with therapist helping to steady RW    Time 8    Period Weeks    Status New      PT LONG TERM GOAL #4   Title Pt will stand at [facility] with min guard with no UE support for at least 1 minute and 30 seconds in order to demo improved balance    Baseline 32.84 seconds on 09/12/20    Time 8    Status New      PT LONG TERM GOAL #5   Title Pt will ambulate at least 345'' over level surfaces with RW and min guard/min A in order to demo improved household mobility and improved endurance    Baseline 230' max distance in a single bout with min guard/min A    Time 8    Period Weeks    Status On-going                   Plan - 10/19/20 1109     Clinical Impression Statement Today's skilled session continued to focus on gait and transfers with min guard to min assist needed. Progress to working in parallel bars on forward step ups for improved tolerance to stairs and side stepping for strengthening/balance. Rest breaks needed due to fatigue and assist needed with step ups as pt fatiuged to get foot onto step and at time to lift foot off step. No other issues noted or reported in session. The pt is making steady progress toward goals and should benefit from continued PT to progress toward unmet goals.    Personal Factors and Comorbidities Comorbidity  3+;Past/Current Experience    Comorbidities T8 paraplegia, diabetes, HTN    Examination-Activity Limitations Locomotion Level;Transfers;Stand    Examination-Participation Restrictions Community Activity;Yard Work   Community education officer Evolving/Moderate complexity    Rehab Potential Good    PT Frequency 2x / week   1-2x   PT Duration 12 weeks   15 visits over 12 weeks   PT Treatment/Interventions Aquatic Therapy;Manual techniques;Therapeutic exercise;Gait training;Neuromuscular re-education;Orthotic Fit/Training    PT Next Visit Plan continue to work on gait distance, step ups in parallel bars, side stepping. hip abductor strengthening for decreased scissoring with gait, standing balance with decreased UE support, quadruped/tall kneeling for hip/core strengthening    PT Home Exercise Plan Access Code: [email or web address]    Consulted and Agree with Plan of Care Patient             Patient will benefit from skilled therapeutic intervention in order to improve the following deficits and impairments:  Decreased balance, Decreased coordination, Decreased range of motion, Decreased strength, Decreased mobility, Postural dysfunction, Impaired tone, Decreased activity tolerance, Abnormal gait, Decreased endurance, Difficulty walking, Impaired sensation  Visit Diagnosis: Difficulty in walking, not elsewhere  classified  Muscle weakness (generalized)  Unsteadiness on feet  Other symptoms and signs involving the nervous system     Problem List Patient Active Problem List   Diagnosis Date Noted   Erectile dysfunction due to diseases classified elsewhere 03/02/2020   Wheelchair dependence 11/27/2019   Spasticity 08/03/2019   Paraplegia ([place]) 07/07/2019   Neurogenic bowel 07/07/2019   Neurogenic bladder 07/07/2019   Thoracic myelopathy 06/30/2019    Willow Ora, PTA, [facility] [address], [place], [place]  [address] [phone] 10/20/20, 1:38 PM   Name: Timothy Davenport MRN: [id number] Date of Birth: 09-05-1950

## 2020-10-26 ENCOUNTER — Other Ambulatory Visit: Payer: Self-pay

## 2020-10-26 ENCOUNTER — Encounter: Payer: Self-pay | Admitting: Physical Therapy

## 2020-10-26 ENCOUNTER — Ambulatory Visit: Payer: No Typology Code available for payment source | Attending: Family Medicine | Admitting: Physical Therapy

## 2020-10-26 DIAGNOSIS — M6281 Muscle weakness (generalized): Secondary | ICD-10-CM | POA: Diagnosis present

## 2020-10-26 DIAGNOSIS — R293 Abnormal posture: Secondary | ICD-10-CM | POA: Insufficient documentation

## 2020-10-26 DIAGNOSIS — R29818 Other symptoms and signs involving the nervous system: Secondary | ICD-10-CM | POA: Diagnosis present

## 2020-10-26 DIAGNOSIS — R2681 Unsteadiness on feet: Secondary | ICD-10-CM | POA: Diagnosis present

## 2020-10-26 DIAGNOSIS — R262 Difficulty in walking, not elsewhere classified: Secondary | ICD-10-CM | POA: Diagnosis not present

## 2020-10-26 NOTE — Therapy (Signed)
[facility] [address] [place], [name], [address] Phone: [phone]   Fax:  [phone]  Physical Therapy Treatment  Patient Details  Name: Timothy Davenport MRN: [id number] Date of Birth: 06/12/1950 Referring Provider (PT): Juel Burrow    Encounter Date: 10/26/2020   PT End of Session - 10/26/20 1204     Visit Number 55    Number of Visits 32    Date for PT Re-Evaluation 12/11/20    Authorization Type VA - 15 visits 02/05/20 - 06/04/20, 15 additional visits from 3/8-08/24/20, 15 additional visits through 10/22/20, 15 additional visits through 12/28/20    Authorization - Visit Number 9    Authorization - Number of Visits 15    Progress Note Due on Visit 42    PT Start Time 1056    PT Stop Time 1141    PT Time Calculation (min) 45 min    Equipment Utilized During Treatment Gait belt;Other (comment)   pool noodle, gait belt, buoyant ankle cuffs   Activity Tolerance Patient tolerated treatment well    Behavior During Therapy WFL for tasks assessed/performed             Past Medical History:  Diagnosis Date   Colon polyp    Diabetes mellitus without complication (HCC)    Diabetic neuropathy (HCC)    HTN (hypertension)    Patella fracture    Renal calculi     Past Surgical History:  Procedure Laterality Date   LUMBAR LAMINECTOMY/DECOMPRESSION MICRODISCECTOMY N/A 07/01/2019   Procedure: LUMBAR LAMINECTOMY/DECOMPRESSION MICRODISCECTOMY 1 LEVEL, THORACIC SIX-SEVEN;  Surgeon: Consuella Lose, MD;  Location: [facility];  Service: Neurosurgery;  Laterality: N/A;  LUMBAR LAMINECTOMY/DECOMPRESSION MICRODISCECTOMY 1 LEVEL, THORACIC SIX-SEVEN    ORIF PATELLA     THORACIC DISCECTOMY N/A 07/03/2019   Procedure: Evacuation of Thoracic Hematoma;  Surgeon: Consuella Lose, MD;  Location: [facility];  Service: Neurosurgery;  Laterality: N/A;    There were no vitals filed for this visit.   Subjective Assessment - 10/26/20 1059      Subjective Planning on going to [place] tomorrow to get a toe cap for his right shoe.    Pertinent History T8 paraplegia, diabetes, HTN    How long can you stand comfortably? probably a couple minutes with RW.    How long can you walk comfortably? approx. 5-10 minutes with his son and RW.    Patient Stated Goals wants to be able to walk without any assistance    Currently in Pain? No/denies                               [facility] Adult PT Treatment/Exercise - 10/26/20 1127       Transfers   Transfers Sit to Stand;Stand to Sit    Sit to Stand 4: Min guard;With upper extremity assist;From bed    Sit to Stand Details (indicate cue type and reason) multiple reps performed in // bars using BUE support from manual w/c    Stand to Sit 4: Min guard;With upper extremity assist    Stand to Sit Details performed in // bars, using BUE support to lower into w/c      Neuro Re-ed    Neuro Re-ed Details  in // bars:with RLE as stance leg and tapping LLE to step 2 x 5 reps B - cues for R glute/quad activation and to perform with LLE with incr hip flexion vs. circumduction,  then performed with tapping RLE to 2" step, x10 reps with therapist needing to provide min A after rep 6 for foot clearance, mini squats with BUE support x10 reps with cues for technique and to push through BLE vs. using BUE      Knee/Hip Exercises: Standing   Forward Step Up Both;2 sets;5 reps;Hand Hold: 2;Step Height: 4";Limitations    Forward Step Up Limitations in parallel bars with BUE support-  keeping RLE as stance leg on step and stepping LLE on and off, repeated with LLE on step, needing min assist to help clear RLE on/off step at times when fatigued. cues for knee extension and to stand with tall posture to look into mirror. pt needing a seated rest break after each set. min guard/min A throughout      Knee/Hip Exercises: Supine   Bridges AROM;Strengthening;Both;1 set;10 reps    Bridges Limitations with  red tband around thighs, cues for hip ABD when performing bridge, x10 reps      Knee/Hip Exercises: Sidelying   Hip ABduction AAROM;Strengthening;Both;10 reps;2 sets    Hip ABduction Limitations clamshells, 2 x 10 reps B, therapist helping pt maintain proper alignment and AAROM RLE>LLE                   PT Education - 10/26/20 1205     Education Details scheduling for additional appts, PT to request an additional 15 visits from the [place]    Person(s) Educated Patient    Methods Explanation    Comprehension Verbalized understanding              PT Short Term Goals - 10/18/20 1321       PT SHORT TERM GOAL #1   Title Pt will improve gait speed with RW and R AFO to at least 1.18 ft/sec in order to demo improved household mobility. ALL STGS DUE 10/11/20    Baseline 1.09 ft/sec; 1.21 ft/sec on 10/12/20    Time 4    Period Weeks    Status Achieved    Target Date 10/11/20      PT SHORT TERM GOAL #2   Title Pt will stand at [facility] with min guard with no UE support for at least 1 minute in order to demo improved balance for ADLs.    Baseline 32.84 seconds on 09/12/20; standing at RW without UE support: 43.12 seconds with min guard for balance on 10/12/20    Time 4    Period Weeks    Status Not Met      PT SHORT TERM GOAL #3   Title Pt will ambulate at least 230' consistently with RW and min guard throughout in order to demo improved household mobility.    Baseline 08/18/20; pt able to achieve distance with min guard to min assist for balance    Time 4    Period Weeks    Status On-going      PT SHORT TERM GOAL #4   Title Pt will decr 5x sit <> Stand time to 30 seconds or less with UE support from 20" mat table in order to demo improved functional transfers    Baseline 34.75 seconds on 09/13/20; 18.43 seconds on 10/12/20    Time 4    Period Weeks    Status Achieved               PT Long Term Goals - 10/18/20 1322       PT LONG TERM GOAL #1   Title  Pt and spouse will be  independent with final HEP for BLE stretching and strengthening. ALL LTGS DUE 11/08/20    Baseline pt will continue to benefit from ongoing additions    Time 8    Period Weeks    Status On-going      PT LONG TERM GOAL #2   Title Pt will improve gait speed with RW and R AFO to at least 1.28 ft/sec in order to demo improved household mobility.    Baseline 29.92 = 1.09 ft/sec on 09/12/20    Time 8    Period Weeks    Status Achieved      PT LONG TERM GOAL #3   Title Pt will perform 5x sit <> stand from standard height mat table with supervision in order to demo improved functional transfers.    Baseline 34.75 seconds with therapist helping to steady RW    Time 8    Period Weeks    Status New      PT LONG TERM GOAL #4   Title Pt will stand at [facility] with min guard with no UE support for at least 1 minute and 30 seconds in order to demo improved balance    Baseline 32.84 seconds on 09/12/20    Time 8    Status New      PT LONG TERM GOAL #5   Title Pt will ambulate at least 345'' over level surfaces with RW and min guard/min A in order to demo improved household mobility and improved endurance    Baseline 230' max distance in a single bout with min guard/min A    Time 8    Period Weeks    Status On-going                   Plan - 10/26/20 1206     Clinical Impression Statement Continued to work on BLE strenghening and standing tolerance during today's session. Performed step ups on to 4" step bilat, with pt needing assist with foot clearance with RLE to step on/off step when more fatigued. Pt needing intermittent seated rest breaks during standing activities in // bars. Will continue to progress towards LTGs.    Personal Factors and Comorbidities Comorbidity 3+;Past/Current Experience    Comorbidities T8 paraplegia, diabetes, HTN    Examination-Activity Limitations Locomotion Level;Transfers;Stand    Examination-Participation Restrictions Community Activity;Yard Work   Systems analyst Evolving/Moderate complexity    Rehab Potential Good    PT Frequency 2x / week   1-2x   PT Duration 12 weeks   15 visits over 12 weeks   PT Treatment/Interventions Aquatic Therapy;Manual techniques;Therapeutic exercise;Gait training;Neuromuscular re-education;Orthotic Fit/Training    PT Next Visit Plan continue to work on gait distance, step ups in parallel bars, side stepping. hip abductor strengthening for decreased scissoring with gait, standing balance with decreased UE support, quadruped/tall kneeling for hip/core strengthening    PT Home Exercise Plan Access Code: [email or web address]    Consulted and Agree with Plan of Care Patient             Patient will benefit from skilled therapeutic intervention in order to improve the following deficits and impairments:  Decreased balance, Decreased coordination, Decreased range of motion, Decreased strength, Decreased mobility, Postural dysfunction, Impaired tone, Decreased activity tolerance, Abnormal gait, Decreased endurance, Difficulty walking, Impaired sensation  Visit Diagnosis: Difficulty in walking, not elsewhere classified  Muscle weakness (generalized)  Unsteadiness on feet  Other symptoms and signs involving the  nervous system     Problem List Patient Active Problem List   Diagnosis Date Noted   Erectile dysfunction due to diseases classified elsewhere 03/02/2020   Wheelchair dependence 11/27/2019   Spasticity 08/03/2019   Paraplegia ([place]) 07/07/2019   Neurogenic bowel 07/07/2019   Neurogenic bladder 07/07/2019   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 [place], [name], [address] Phone: [phone]   Fax:  [phone]  Name: Timothy Davenport MRN: [id number] Date of Birth: 1950/11/18

## 2020-10-31 ENCOUNTER — Ambulatory Visit: Payer: No Typology Code available for payment source | Admitting: Physical Therapy

## 2020-11-02 ENCOUNTER — Ambulatory Visit: Payer: No Typology Code available for payment source | Admitting: Physical Therapy

## 2020-11-02 ENCOUNTER — Ambulatory Visit: Payer: BC Managed Care – PPO | Admitting: Physical Therapy

## 2020-11-02 ENCOUNTER — Other Ambulatory Visit: Payer: Self-pay

## 2020-11-02 DIAGNOSIS — R262 Difficulty in walking, not elsewhere classified: Secondary | ICD-10-CM

## 2020-11-02 DIAGNOSIS — M6281 Muscle weakness (generalized): Secondary | ICD-10-CM

## 2020-11-02 DIAGNOSIS — R2681 Unsteadiness on feet: Secondary | ICD-10-CM

## 2020-11-03 ENCOUNTER — Encounter: Payer: Self-pay | Admitting: Physical Therapy

## 2020-11-03 NOTE — Therapy (Signed)
[facility] [address] [place], [name], [address] Phone: [phone]   Fax:  [phone]  Physical Therapy Treatment  Patient Details  Name: Timothy Davenport MRN: [id number] Date of Birth: 01/25/1951 Referring Provider (PT): Juel Burrow    Encounter Date: 11/02/2020   PT End of Session - 11/03/20 1641     Visit Number 56    Number of Visits 37    Date for PT Re-Evaluation 12/11/20    Authorization Type VA - 15 visits 02/05/20 - 06/04/20, 15 additional visits from 3/8-08/24/20, 15 additional visits through 10/22/20, 15 additional visits through 12/28/20    Authorization - Visit Number 10    Authorization - Number of Visits 15    Progress Note Due on Visit 63    PT Start Time 1415    PT Stop Time 1500    PT Time Calculation (min) 45 min    Equipment Utilized During Treatment Gait belt;Other (comment)   pool noodle, gait belt, buoyant ankle cuffs, aquatic weights   Activity Tolerance Patient tolerated treatment well    Behavior During Therapy WFL for tasks assessed/performed             Past Medical History:  Diagnosis Date   Colon polyp    Diabetes mellitus without complication (HCC)    Diabetic neuropathy (HCC)    HTN (hypertension)    Patella fracture    Renal calculi     Past Surgical History:  Procedure Laterality Date   LUMBAR LAMINECTOMY/DECOMPRESSION MICRODISCECTOMY N/A 07/01/2019   Procedure: LUMBAR LAMINECTOMY/DECOMPRESSION MICRODISCECTOMY 1 LEVEL, THORACIC SIX-SEVEN;  Surgeon: Consuella Lose, MD;  Location: [facility];  Service: Neurosurgery;  Laterality: N/A;  LUMBAR LAMINECTOMY/DECOMPRESSION MICRODISCECTOMY 1 LEVEL, THORACIC SIX-SEVEN    ORIF PATELLA     THORACIC DISCECTOMY N/A 07/03/2019   Procedure: Evacuation of Thoracic Hematoma;  Surgeon: Consuella Lose, MD;  Location: [facility];  Service: Neurosurgery;  Laterality: N/A;    There were no vitals filed for this visit.   Subjective  Assessment - 11/03/20 1640     Subjective No problems or changes reported    Pertinent History T8 paraplegia, diabetes, HTN    How long can you stand comfortably? probably a couple minutes with RW.    How long can you walk comfortably? approx. 5-10 minutes with his son and RW.    Patient Stated Goals wants to be able to walk without any assistance    Currently in Pain? No/denies                  Aquatic therapy at [facility] - pool temp 94 degrees   Patient seen for aquatic therapy today.  Treatment took place in water 3.6-4.5 feet deep depending upon activity.   Pt entered the pool via step negotiation with use of bil. hand rails with min assist with descension and mod to max assist for ascension - mod to max assist for lifting each LE onto step, especially the final 3 steps as pt was fatigued (7 steps total in pool).   Pt performed standing hamstring/heel cord stretch (runner's stretch) 30 sec hold x 1 rep each leg at beginning of session   Pt performed marching in place 10 reps each leg - holding onto pool edge prn for assist with balance; pt held a bar bell in each hand - performed marching across pool holding large bar bell stabilized by PT  Performed bil LE squats 10 reps; unilateral squats 10 reps each  with min assist for balance   Pt performed sidestepping 18' x 2 reps across pool holding onto bar bell stabilized by PT  Pt performed static standing balance exercise - standing with feet shoulder width apart - pushing and pulling bar bell forward/back for trunk/core stabilization 10 reps  Pt performed step ups with use of aquatic step in 4.0' water depth - 10 reps each leg with min assist with UE support on pool edge; step down 10 reps each leg with LUE support on pool edge  Pt performed standing hip flexion, extension, abduction 10 reps each with use of 2.5# aquatic weight with bil. UE support on pool edge - CGA for safety   Pt requires buoyancy of water for  support and for joint offloading; standing activities are much safer in water with reduced fall risk compared to same activities performed on land.  Viscosity of water needed for resistance for strengthening bil. LE's.  Current of water provides perturbations for challenges with balance.                               PT Short Term Goals - 11/03/20 1652       PT SHORT TERM GOAL #1   Title Pt will improve gait speed with RW and R AFO to at least 1.18 ft/sec in order to demo improved household mobility. ALL STGS DUE 10/11/20    Baseline 1.09 ft/sec; 1.21 ft/sec on 10/12/20    Time 4    Period Weeks    Status Achieved    Target Date 10/11/20      PT SHORT TERM GOAL #2   Title Pt will stand at [facility] with min guard with no UE support for at least 1 minute in order to demo improved balance for ADLs.    Baseline 32.84 seconds on 09/12/20; standing at RW without UE support: 43.12 seconds with min guard for balance on 10/12/20    Time 4    Period Weeks    Status Not Met      PT SHORT TERM GOAL #3   Title Pt will ambulate at least 230' consistently with RW and min guard throughout in order to demo improved household mobility.    Baseline 08/18/20; pt able to achieve distance with min guard to min assist for balance    Time 4    Period Weeks    Status On-going      PT SHORT TERM GOAL #4   Title Pt will decr 5x sit <> Stand time to 30 seconds or less with UE support from 20" mat table in order to demo improved functional transfers    Baseline 34.75 seconds on 09/13/20; 18.43 seconds on 10/12/20    Time 4    Period Weeks    Status Achieved               PT Long Term Goals - 11/03/20 1652       PT LONG TERM GOAL #1   Title Pt and spouse will be independent with final HEP for BLE stretching and strengthening. ALL LTGS DUE 11/08/20    Baseline pt will continue to benefit from ongoing additions    Time 8    Period Weeks    Status On-going      PT LONG TERM GOAL #2    Title Pt will improve gait speed with RW and R AFO to at least 1.28 ft/sec in order to  demo improved household mobility.    Baseline 29.92 = 1.09 ft/sec on 09/12/20    Time 8    Period Weeks    Status Achieved      PT LONG TERM GOAL #3   Title Pt will perform 5x sit <> stand from standard height mat table with supervision in order to demo improved functional transfers.    Baseline 34.75 seconds with therapist helping to steady RW    Time 8    Period Weeks    Status New      PT LONG TERM GOAL #4   Title Pt will stand at [facility] with min guard with no UE support for at least 1 minute and 30 seconds in order to demo improved balance    Baseline 32.84 seconds on 09/12/20    Time 8    Status New      PT LONG TERM GOAL #5   Title Pt will ambulate at least 345'' over level surfaces with RW and min guard/min A in order to demo improved household mobility and improved endurance    Baseline 230' max distance in a single bout with min guard/min A    Time 8    Period Weeks    Status On-going                   Plan - 11/03/20 1643     Clinical Impression Statement Aquatic therapy session focused on gait training with gradual decrease in UE support and bil. LE strengthening.  Pt did very well with gait training with use of a bar bell in each hand and was able to maintain balance better with bar bells than he was with use of the single large bar bell; PT stabilized bar bell for pt whereas pt able to maintain balance without needing stabilization of the 2 separate bar bells.  Pt able to negotiate steps with use of bil. hand rails with min assist with descension and mod to max assist with ascension for exiting pool for last 2-3 steps due to fatigue.  Pt able to use 2.5# weight for PRE's with bil. UE support. Cont with POC.    Personal Factors and Comorbidities Comorbidity 3+;Past/Current Experience    Comorbidities T8 paraplegia, diabetes, HTN    Examination-Activity Limitations Locomotion  Level;Transfers;Stand    Examination-Participation Restrictions Community Activity;Yard Work   Community education officer Evolving/Moderate complexity    Rehab Potential Good    PT Frequency 2x / week   1-2x   PT Duration 12 weeks   15 visits over 12 weeks   PT Treatment/Interventions Aquatic Therapy;Manual techniques;Therapeutic exercise;Gait training;Neuromuscular re-education;Orthotic Fit/Training    PT Next Visit Plan continue to work on gait distance, step ups in parallel bars, side stepping. hip abductor strengthening for decreased scissoring with gait, standing balance with decreased UE support, quadruped/tall kneeling for hip/core strengthening    PT Home Exercise Plan Access Code: [email or web address]    Consulted and Agree with Plan of Care Patient             Patient will benefit from skilled therapeutic intervention in order to improve the following deficits and impairments:  Decreased balance, Decreased coordination, Decreased range of motion, Decreased strength, Decreased mobility, Postural dysfunction, Impaired tone, Decreased activity tolerance, Abnormal gait, Decreased endurance, Difficulty walking, Impaired sensation  Visit Diagnosis: Difficulty in walking, not elsewhere classified  Muscle weakness (generalized)  Unsteadiness on feet     Problem List Patient Active Problem List  Diagnosis Date Noted   Erectile dysfunction due to diseases classified elsewhere 03/02/2020   Wheelchair dependence 11/27/2019   Spasticity 08/03/2019   Paraplegia ([place]) 07/07/2019   Neurogenic bowel 07/07/2019   Neurogenic bladder 07/07/2019   Thoracic myelopathy 06/30/2019    Dezyrae Kensinger, [place], PT, ATRIC 11/03/2020, 4:53 PM  [facility] [address] [place], [name], [address] Phone: [phone]   Fax:  [phone]  Name: OCTAVIAN GODEK MRN: [id number] Date of Birth: Dec 07, 1950

## 2020-11-04 ENCOUNTER — Encounter: Payer: Self-pay | Admitting: Physical Therapy

## 2020-11-04 ENCOUNTER — Other Ambulatory Visit: Payer: Self-pay

## 2020-11-04 ENCOUNTER — Ambulatory Visit: Payer: No Typology Code available for payment source | Admitting: Physical Therapy

## 2020-11-04 DIAGNOSIS — R262 Difficulty in walking, not elsewhere classified: Secondary | ICD-10-CM

## 2020-11-04 DIAGNOSIS — R29818 Other symptoms and signs involving the nervous system: Secondary | ICD-10-CM

## 2020-11-04 DIAGNOSIS — R293 Abnormal posture: Secondary | ICD-10-CM

## 2020-11-04 DIAGNOSIS — R2681 Unsteadiness on feet: Secondary | ICD-10-CM

## 2020-11-04 DIAGNOSIS — M6281 Muscle weakness (generalized): Secondary | ICD-10-CM

## 2020-11-05 NOTE — Therapy (Signed)
[facility] [address] [place], [name], [address] Phone: [phone]   Fax:  [phone]  Physical Therapy Treatment  Patient Details  Name: Timothy Davenport MRN: [id number] Date of Birth: Jul 09, 1950 Referring Provider (PT): Juel Burrow    Encounter Date: 11/04/2020   PT End of Session - 11/04/20 1021     Visit Number 42    Number of Visits 13    Date for PT Re-Evaluation 12/11/20    Authorization Type VA - 15 visits 02/05/20 - 06/04/20, 15 additional visits from 3/8-08/24/20, 15 additional visits through 10/22/20, 15 additional visits through 12/28/20    Authorization - Visit Number 11    Authorization - Number of Visits 15    Progress Note Due on Visit 45    PT Start Time 1018    PT Stop Time 1100    PT Time Calculation (min) 42 min    Equipment Utilized During Treatment Gait belt    Activity Tolerance Patient tolerated treatment well    Behavior During Therapy WFL for tasks assessed/performed             Past Medical History:  Diagnosis Date   Colon polyp    Diabetes mellitus without complication ([place])    Diabetic neuropathy ([place])    HTN (hypertension)    Patella fracture    Renal calculi     Past Surgical History:  Procedure Laterality Date   LUMBAR LAMINECTOMY/DECOMPRESSION MICRODISCECTOMY N/A 07/01/2019   Procedure: LUMBAR LAMINECTOMY/DECOMPRESSION MICRODISCECTOMY 1 LEVEL, THORACIC SIX-SEVEN;  Surgeon: Consuella Lose, MD;  Location: [facility];  Service: Neurosurgery;  Laterality: N/A;  LUMBAR LAMINECTOMY/DECOMPRESSION MICRODISCECTOMY 1 LEVEL, THORACIC SIX-SEVEN    ORIF PATELLA     THORACIC DISCECTOMY N/A 07/03/2019   Procedure: Evacuation of Thoracic Hematoma;  Surgeon: Consuella Lose, MD;  Location: [facility];  Service: Neurosurgery;  Laterality: N/A;    There were no vitals filed for this visit.   Subjective Assessment - 11/04/20 1021     Subjective No new complatins. No falls or pain to  report. Felt good, tired, after last session.    Pertinent History T8 paraplegia, diabetes, HTN    How long can you stand comfortably? probably a couple minutes with RW.    How long can you walk comfortably? approx. 5-10 minutes with his son and RW.    Patient Stated Goals wants to be able to walk without any assistance    Currently in Pain? No/denies    Pain Score 0-No pain                   [facility] Adult PT Treatment/Exercise - 11/04/20 1023       Transfers   Transfers Sit to Stand;Stand to Sit    Sit to Stand 4: Min guard;With upper extremity assist;From bed    Stand to Sit 4: Min guard;With upper extremity assist      Ambulation/Gait   Ambulation/Gait Yes    Ambulation/Gait Assistance 4: Min guard;4: Min assist    Ambulation/Gait Assistance Details reminder cues for posture, hip/knee extension in stance and to keep feet apart to decrease scissoring    Ambulation Distance (Feet) 60 Feet   x2 reps   Assistive device Rolling walker    Gait Pattern Decreased weight shift to right;Left flexed knee in stance;Right flexed knee in stance;Narrow base of support;Decreased step length - right;Decreased step length - left;Decreased stance time - right;Decreased hip/knee flexion - right;Step-through pattern    Ambulation Surface Level;Indoor  Neuro Re-ed    Neuro Re-ed Details  for balance/muscle re-ed: standing with feet hip width apart- alternating UE raises  x10 each side, then "clapping" for 10 reps. min guard to min assist for balance; then had pt try to hover hands over bars for static unsupported balance- 6 sec's, 10 sec's then 5 sec's with min>mod assist for balance as pt fatigued.      Knee/Hip Exercises: Aerobic   Other Aerobic Scifit level 3.5 wtih LE's only at seat 22 (x 8 minutes with goal >/= 70 steps per minute for strengthening and activity tolerance. Use of blue theraband around thighs to maintain LE neutral position.      Knee/Hip Exercises: Standing   Forward  Step Up Both;Hand Hold: 2;Step Height: 4"    Forward Step Up Limitations in parallel bars with BUE support-  keeping RLE as stance leg on step and stepping LLE on and off, repeated with LLE on step, needing min assist to help clear RLE on/off step at times when fatigued. cues for knee extension and to stand with tall posture to look into mirror. pt needing a seated rest break after each set. min guard/min A throughout                       PT Short Term Goals - 11/03/20 1652       PT SHORT TERM GOAL #1   Title Pt will improve gait speed with RW and R AFO to at least 1.18 ft/sec in order to demo improved household mobility. ALL STGS DUE 10/11/20    Baseline 1.09 ft/sec; 1.21 ft/sec on 10/12/20    Time 4    Period Weeks    Status Achieved    Target Date 10/11/20      PT SHORT TERM GOAL #2   Title Pt will stand at [facility] with min guard with no UE support for at least 1 minute in order to demo improved balance for ADLs.    Baseline 32.84 seconds on 09/12/20; standing at RW without UE support: 43.12 seconds with min guard for balance on 10/12/20    Time 4    Period Weeks    Status Not Met      PT SHORT TERM GOAL #3   Title Pt will ambulate at least 230' consistently with RW and min guard throughout in order to demo improved household mobility.    Baseline 08/18/20; pt able to achieve distance with min guard to min assist for balance    Time 4    Period Weeks    Status On-going      PT SHORT TERM GOAL #4   Title Pt will decr 5x sit <> Stand time to 30 seconds or less with UE support from 20" mat table in order to demo improved functional transfers    Baseline 34.75 seconds on 09/13/20; 18.43 seconds on 10/12/20    Time 4    Period Weeks    Status Achieved               PT Long Term Goals - 11/03/20 1652       PT LONG TERM GOAL #1   Title Pt and spouse will be independent with final HEP for BLE stretching and strengthening. ALL LTGS DUE 11/08/20    Baseline pt will continue  to benefit from ongoing additions    Time 8    Period Weeks    Status On-going      PT  LONG TERM GOAL #2   Title Pt will improve gait speed with RW and R AFO to at least 1.28 ft/sec in order to demo improved household mobility.    Baseline 29.92 = 1.09 ft/sec on 09/12/20    Time 8    Period Weeks    Status Achieved      PT LONG TERM GOAL #3   Title Pt will perform 5x sit <> stand from standard height mat table with supervision in order to demo improved functional transfers.    Baseline 34.75 seconds with therapist helping to steady RW    Time 8    Period Weeks    Status New      PT LONG TERM GOAL #4   Title Pt will stand at [facility] with min guard with no UE support for at least 1 minute and 30 seconds in order to demo improved balance    Baseline 32.84 seconds on 09/12/20    Time 8    Status New      PT LONG TERM GOAL #5   Title Pt will ambulate at least 345'' over level surfaces with RW and min guard/min A in order to demo improved household mobility and improved endurance    Baseline 230' max distance in a single bout with min guard/min A    Time 8    Period Weeks    Status On-going                   Plan - 11/04/20 1022     Clinical Impression Statement Today's skilled session continued to focus on strengthening, gait and static standing balance with decreased UE support. Rest breaks taken as needed due to fatigue. The pt is making steady progress toward goals and should benefit from continued PT to progress toward unmet goals    Personal Factors and Comorbidities Comorbidity 3+;Past/Current Experience    Comorbidities T8 paraplegia, diabetes, HTN    Examination-Activity Limitations Locomotion Level;Transfers;Stand    Examination-Participation Restrictions Community Activity;Yard Work   Community education officer Evolving/Moderate complexity    Rehab Potential Good    PT Frequency 2x / week   1-2x   PT Duration 12 weeks   15 visits over 12 weeks    PT Treatment/Interventions Aquatic Therapy;Manual techniques;Therapeutic exercise;Gait training;Neuromuscular re-education;Orthotic Fit/Training    PT Next Visit Plan continue to work on gait distance, step ups in parallel bars, side stepping. hip abductor strengthening for decreased scissoring with gait, standing balance with decreased UE support, quadruped/tall kneeling for hip/core strengthening    PT Home Exercise Plan Access Code: [email or web address]    Consulted and Agree with Plan of Care Patient             Patient will benefit from skilled therapeutic intervention in order to improve the following deficits and impairments:  Decreased balance, Decreased coordination, Decreased range of motion, Decreased strength, Decreased mobility, Postural dysfunction, Impaired tone, Decreased activity tolerance, Abnormal gait, Decreased endurance, Difficulty walking, Impaired sensation  Visit Diagnosis: Difficulty in walking, not elsewhere classified  Muscle weakness (generalized)  Unsteadiness on feet  Other symptoms and signs involving the nervous system  Abnormal posture     Problem List Patient Active Problem List   Diagnosis Date Noted   Erectile dysfunction due to diseases classified elsewhere 03/02/2020   Wheelchair dependence 11/27/2019   Spasticity 08/03/2019   Paraplegia ([place]) 07/07/2019   Neurogenic bowel 07/07/2019   Neurogenic bladder 07/07/2019   Thoracic myelopathy 06/30/2019   Juliann Pulse  Johnsie Cancel, [facility] [address], [place] [employer], [place] [address] [phone] 11/05/20, 4:48 PM   Name: MIHRAN LEBARRON MRN: [id number] Date of Birth: May 26, 1950

## 2020-11-07 ENCOUNTER — Other Ambulatory Visit: Payer: Self-pay

## 2020-11-07 ENCOUNTER — Ambulatory Visit: Payer: BC Managed Care – PPO | Admitting: Physical Therapy

## 2020-11-07 ENCOUNTER — Encounter: Payer: Self-pay | Admitting: Physical Therapy

## 2020-11-07 DIAGNOSIS — R293 Abnormal posture: Secondary | ICD-10-CM

## 2020-11-07 DIAGNOSIS — R262 Difficulty in walking, not elsewhere classified: Secondary | ICD-10-CM | POA: Diagnosis not present

## 2020-11-07 DIAGNOSIS — R29818 Other symptoms and signs involving the nervous system: Secondary | ICD-10-CM

## 2020-11-07 DIAGNOSIS — R2681 Unsteadiness on feet: Secondary | ICD-10-CM

## 2020-11-07 DIAGNOSIS — M6281 Muscle weakness (generalized): Secondary | ICD-10-CM

## 2020-11-07 NOTE — Therapy (Signed)
[facility] [address] [place], [name], [address] Phone: [phone]   Fax:  [phone]  Physical Therapy Treatment  Patient Details  Name: Timothy Davenport MRN: [id number] Date of Birth: Nov 11, 1950 Referring Provider (PT): Juel Burrow    Encounter Date: 11/07/2020   PT End of Session - 11/07/20 1855     Visit Number 73    Number of Visits 42    Date for PT Re-Evaluation 12/11/20    Authorization Type VA - 15 visits 02/05/20 - 06/04/20, 15 additional visits from 3/8-08/24/20, 15 additional visits through 10/22/20, 15 additional visits through 12/28/20    Authorization - Visit Number 12    Authorization - Number of Visits 15    Progress Note Due on Visit 53    PT Start Time 1410   transportation running late   PT Stop Time 1445    PT Time Calculation (min) 35 min    Equipment Utilized During Treatment Gait belt    Activity Tolerance Patient tolerated treatment well    Behavior During Therapy WFL for tasks assessed/performed             Past Medical History:  Diagnosis Date   Colon polyp    Diabetes mellitus without complication ([place])    Diabetic neuropathy ([place])    HTN (hypertension)    Patella fracture    Renal calculi     Past Surgical History:  Procedure Laterality Date   LUMBAR LAMINECTOMY/DECOMPRESSION MICRODISCECTOMY N/A 07/01/2019   Procedure: LUMBAR LAMINECTOMY/DECOMPRESSION MICRODISCECTOMY 1 LEVEL, THORACIC SIX-SEVEN;  Surgeon: Consuella Lose, MD;  Location: [facility];  Service: Neurosurgery;  Laterality: N/A;  LUMBAR LAMINECTOMY/DECOMPRESSION MICRODISCECTOMY 1 LEVEL, THORACIC SIX-SEVEN    ORIF PATELLA     THORACIC DISCECTOMY N/A 07/03/2019   Procedure: Evacuation of Thoracic Hematoma;  Surgeon: Consuella Lose, MD;  Location: [facility];  Service: Neurosurgery;  Laterality: N/A;    There were no vitals filed for this visit.   Subjective Assessment - 11/07/20 1849     Subjective No new  complaints. No falls or pain to report.    Pertinent History T8 paraplegia, diabetes, HTN    How long can you stand comfortably? probably a couple minutes with RW.    How long can you walk comfortably? approx. 5-10 minutes with his son and RW.    Patient Stated Goals wants to be able to walk without any assistance    Currently in Pain? No/denies    Pain Score 0-No pain              Aquatic therapy at [facility] - pool temp 94 degrees   Patient seen for aquatic therapy today.  Treatment took place in water 3.5-4.5 feet deep depending upon activity.  Pt entered and  Exited the pool via step negotiation with use of bil. Hands rails, step to pattern both ways. Min assist to descend into pool and mod to max assist to ascend out of pool at end of session. Increased assistance needed with last 3 steps to lift each LE onto step.   Once in pool pt use wall to walk over to bench in pool. Seated on bench in long sitting with bil LE's extended out/back against wall for passive hamstring and heel cord stretching. Gentle overpressure at knees with passive PF for heel cord stretching. Then seated at edge of bench had pt perform sit<>stands with no UE support for 2 sets of 10 reps. Min assist to keep feet on floor of  pool at times.   Gait in pool performed with large bar bell across pool at ~4.2' water depth- forward<>backwards, then laterally left<>right for 3 reps each. Min to mod assist at times for balance with gait. Cues on posture and step placement at times.   For strengthening in ~4.2' water depth:  with use of aquatic step pt performed forward step ups, then lateral step ups for 10 reps each with UE support on wall. Then had pt stand on step with one UE on wall/HHA on other side for step downs for 10 reps each side. Cues on form/posture and technique.  For static standing balance working on decreased UE support: with feet in lateral stance just beyond hip width apart- had pt work on moving  arms/hands in shoulder horizontal abduction motions at water level with min guard to min assist, occasionally bumping into wall for balance assistance. Then had pt moving arms from water level down to legs and back (adduction/abduction of shoulder) with up to min assist needed for balance.   Gait back from wall to stairs with UE support on PTA shoulders working on tall posture with increased step length.      Pt requires buoyancy of water for support for reduced fall risk with gait training and balance exercises with minimal UE support; exercises able to be performed safely in water without the risk of fall compared to those same exercises performed on land;  viscosity of water needed for resistance for strengthening.  Current of water provides perturbations for challenging static & dynamic standing balance.          PT Short Term Goals - 11/03/20 1652       PT SHORT TERM GOAL #1   Title Pt will improve gait speed with RW and R AFO to at least 1.18 ft/sec in order to demo improved household mobility. ALL STGS DUE 10/11/20    Baseline 1.09 ft/sec; 1.21 ft/sec on 10/12/20    Time 4    Period Weeks    Status Achieved    Target Date 10/11/20      PT SHORT TERM GOAL #2   Title Pt will stand at [facility] with min guard with no UE support for at least 1 minute in order to demo improved balance for ADLs.    Baseline 32.84 seconds on 09/12/20; standing at RW without UE support: 43.12 seconds with min guard for balance on 10/12/20    Time 4    Period Weeks    Status Not Met      PT SHORT TERM GOAL #3   Title Pt will ambulate at least 230' consistently with RW and min guard throughout in order to demo improved household mobility.    Baseline 08/18/20; pt able to achieve distance with min guard to min assist for balance    Time 4    Period Weeks    Status On-going      PT SHORT TERM GOAL #4   Title Pt will decr 5x sit <> Stand time to 30 seconds or less with UE support from 20" mat table in order to  demo improved functional transfers    Baseline 34.75 seconds on 09/13/20; 18.43 seconds on 10/12/20    Time 4    Period Weeks    Status Achieved               PT Long Term Goals - 11/03/20 1652       PT LONG TERM GOAL #1   Title  Pt and spouse will be independent with final HEP for BLE stretching and strengthening. ALL LTGS DUE 11/08/20    Baseline pt will continue to benefit from ongoing additions    Time 8    Period Weeks    Status On-going      PT LONG TERM GOAL #2   Title Pt will improve gait speed with RW and R AFO to at least 1.28 ft/sec in order to demo improved household mobility.    Baseline 29.92 = 1.09 ft/sec on 09/12/20    Time 8    Period Weeks    Status Achieved      PT LONG TERM GOAL #3   Title Pt will perform 5x sit <> stand from standard height mat table with supervision in order to demo improved functional transfers.    Baseline 34.75 seconds with therapist helping to steady RW    Time 8    Period Weeks    Status New      PT LONG TERM GOAL #4   Title Pt will stand at [facility] with min guard with no UE support for at least 1 minute and 30 seconds in order to demo improved balance    Baseline 32.84 seconds on 09/12/20    Time 8    Status New      PT LONG TERM GOAL #5   Title Pt will ambulate at least 345'' over level surfaces with RW and min guard/min A in order to demo improved household mobility and improved endurance    Baseline 230' max distance in a single bout with min guard/min A    Time 8    Period Weeks    Status On-going                   Plan - 11/07/20 1856     Clinical Impression Statement Today's skilled session continued to focus on stretching, strengthening, balance and gait in the aquatic setting. Pt again able to negotiate stairs to enter/exit pool with bil rail support, increased assistance to ascend due to pt needs assistance to get foot up onto step when fatigued. No other issues noted or reported in session. The pt is  progressing toward goals and should benefit from continued PT to progress toward unmet goals    Personal Factors and Comorbidities Comorbidity 3+;Past/Current Experience    Comorbidities T8 paraplegia, diabetes, HTN    Examination-Activity Limitations Locomotion Level;Transfers;Stand    Examination-Participation Restrictions Community Activity;Yard Work   Community education officer Evolving/Moderate complexity    Rehab Potential Good    PT Frequency 2x / week   1-2x   PT Duration 12 weeks   15 visits over 12 weeks   PT Treatment/Interventions Aquatic Therapy;Manual techniques;Therapeutic exercise;Gait training;Neuromuscular re-education;Orthotic Fit/Training    PT Next Visit Plan continue to work on gait distance, step ups in parallel bars, side stepping. hip abductor strengthening for decreased scissoring with gait, standing balance with decreased UE support, quadruped/tall kneeling for hip/core strengthening    PT Home Exercise Plan Access Code: [email or web address]    Consulted and Agree with Plan of Care Patient             Patient will benefit from skilled therapeutic intervention in order to improve the following deficits and impairments:  Decreased balance, Decreased coordination, Decreased range of motion, Decreased strength, Decreased mobility, Postural dysfunction, Impaired tone, Decreased activity tolerance, Abnormal gait, Decreased endurance, Difficulty walking, Impaired sensation  Visit Diagnosis: Difficulty in walking, not elsewhere  classified  Muscle weakness (generalized)  Unsteadiness on feet  Other symptoms and signs involving the nervous system  Abnormal posture     Problem List Patient Active Problem List   Diagnosis Date Noted   Erectile dysfunction due to diseases classified elsewhere 03/02/2020   Wheelchair dependence 11/27/2019   Spasticity 08/03/2019   Paraplegia ([place]) 07/07/2019   Neurogenic bowel 07/07/2019   Neurogenic bladder 07/07/2019    Thoracic myelopathy 06/30/2019    Willow Ora, PTA, [facility] [address] [place], [place] [address] [phone] 11/07/20, 7:04 PM   Name: OVID WITMAN MRN: [id number] Date of Birth: December 27, 1950

## 2020-11-09 ENCOUNTER — Encounter: Payer: Self-pay | Admitting: Physical Therapy

## 2020-11-09 ENCOUNTER — Other Ambulatory Visit: Payer: Self-pay

## 2020-11-09 ENCOUNTER — Ambulatory Visit: Payer: No Typology Code available for payment source | Admitting: Physical Therapy

## 2020-11-09 DIAGNOSIS — R2681 Unsteadiness on feet: Secondary | ICD-10-CM

## 2020-11-09 DIAGNOSIS — M6281 Muscle weakness (generalized): Secondary | ICD-10-CM

## 2020-11-09 DIAGNOSIS — R262 Difficulty in walking, not elsewhere classified: Secondary | ICD-10-CM

## 2020-11-09 DIAGNOSIS — R29818 Other symptoms and signs involving the nervous system: Secondary | ICD-10-CM

## 2020-11-09 NOTE — Therapy (Signed)
[facility] [address] [place], [name], [address] Phone: [phone]   Fax:  [phone]  Physical Therapy Treatment  Patient Details  Name: Timothy Davenport MRN: [id number] Date of Birth: [birth year] Referring Provider (PT): Juel Burrow    Encounter Date: 11/09/2020   PT End of Session - 11/09/20 1217     Visit Number 65    Number of Visits 84    Date for PT Re-Evaluation 12/11/20    Authorization Type VA - 15 visits 02/05/20 - 06/04/20, 15 additional visits from 3/8-08/24/20, 15 additional visits through 10/22/20, 15 additional visits through 12/28/20    Authorization - Visit Number 13    Authorization - Number of Visits 15    Progress Note Due on Visit 55    PT Start Time 1019    PT Stop Time 1059    PT Time Calculation (min) 40 min    Equipment Utilized During Treatment Gait belt    Activity Tolerance Patient tolerated treatment well    Behavior During Therapy WFL for tasks assessed/performed             Past Medical History:  Diagnosis Date   Colon polyp    Diabetes mellitus without complication ([place])    Diabetic neuropathy ([place])    HTN (hypertension)    Patella fracture    Renal calculi     Past Surgical History:  Procedure Laterality Date   LUMBAR LAMINECTOMY/DECOMPRESSION MICRODISCECTOMY N/A 07/01/2019   Procedure: LUMBAR LAMINECTOMY/DECOMPRESSION MICRODISCECTOMY 1 LEVEL, THORACIC SIX-SEVEN;  Surgeon: Consuella Lose, MD;  Location: [facility];  Service: Neurosurgery;  Laterality: N/A;  LUMBAR LAMINECTOMY/DECOMPRESSION MICRODISCECTOMY 1 LEVEL, THORACIC SIX-SEVEN    ORIF PATELLA     THORACIC DISCECTOMY N/A 07/03/2019   Procedure: Evacuation of Thoracic Hematoma;  Surgeon: Consuella Lose, MD;  Location: [facility];  Service: Neurosurgery;  Laterality: N/A;    There were no vitals filed for this visit.   Subjective Assessment - 11/09/20 1021     Subjective Pool is going great.    Pertinent History  T8 paraplegia, diabetes, HTN    How long can you stand comfortably? probably a couple minutes with RW.    How long can you walk comfortably? approx. 5-10 minutes with his son and RW.    Patient Stated Goals wants to be able to walk without any assistance    Currently in Pain? No/denies                               [facility] Adult PT Treatment/Exercise - 11/09/20 1037       Transfers   Transfers Sit to Stand;Stand to Sit    Sit to Stand 5: Supervision;4: Min guard;With upper extremity assist    Stand to Sit 5: Supervision;With upper extremity assist;4: Min guard    Comments x5 reps from mat table with RW with supervision, therapist not needing to steady RW, cues for slowed and controlled      Ambulation/Gait   Ambulation/Gait Yes    Ambulation/Gait Assistance 4: Min guard;4: Min assist    Ambulation/Gait Assistance Details cues for posture throughout and hip/knee extension in stance for improved foot clearance during swing. during last lap pt did demo decr RLE foot clearance and more forward flexed posture with incr bilat knee flexion. w/c follow in case pt with incr fatigued, but not needed today.  pt with uncontrolled descent to mat table at end of bout  of gait due to fatigue    Ambulation Distance (Feet) 345 Feet   x1   Assistive device Rolling walker    Gait Pattern Decreased weight shift to right;Left flexed knee in stance;Right flexed knee in stance;Narrow base of support;Decreased step length - right;Decreased step length - left;Decreased stance time - right;Decreased hip/knee flexion - right;Step-through pattern    Ambulation Surface Level;Indoor      Neuro Re-ed    Neuro Re-ed Details  --      Exercises   Exercises Other Exercises    Other Exercises  in // bars with BUE support: side stepping down and back x2 reps with cues to clear foot vs. sliding it on the ground and for tall posture with glute activation. x10 reps mini squats, cues for technique and  knee/glute activation      Knee/Hip Exercises: Aerobic   Other Aerobic Scifit level 3.5 wtih LE's only for strengthening/ROM/activity tolerance for 5 minutes with cues for full ROM.                       PT Short Term Goals - 11/03/20 1652       PT SHORT TERM GOAL #1   Title Pt will improve gait speed with RW and R AFO to at least 1.18 ft/sec in order to demo improved household mobility. ALL STGS DUE 10/11/20    Baseline 1.09 ft/sec; 1.21 ft/sec on 10/12/20    Time 4    Period Weeks    Status Achieved    Target Date 10/11/20      PT SHORT TERM GOAL #2   Title Pt will stand at [facility] with min guard with no UE support for at least 1 minute in order to demo improved balance for ADLs.    Baseline 32.84 seconds on 09/12/20; standing at RW without UE support: 43.12 seconds with min guard for balance on 10/12/20    Time 4    Period Weeks    Status Not Met      PT SHORT TERM GOAL #3   Title Pt will ambulate at least 230' consistently with RW and min guard throughout in order to demo improved household mobility.    Baseline 08/18/20; pt able to achieve distance with min guard to min assist for balance    Time 4    Period Weeks    Status On-going      PT SHORT TERM GOAL #4   Title Pt will decr 5x sit <> Stand time to 30 seconds or less with UE support from 20" mat table in order to demo improved functional transfers    Baseline 34.75 seconds on 09/13/20; 18.43 seconds on 10/12/20    Time 4    Period Weeks    Status Achieved               PT Long Term Goals - 11/09/20 1219       PT LONG TERM GOAL #1   Title Pt and spouse will be independent with final HEP for BLE stretching and strengthening. ALL LTGS DUE 11/08/20    Baseline pt will continue to benefit from ongoing additions    Time 8    Period Weeks    Status On-going      PT LONG TERM GOAL #2   Title Pt will improve gait speed with RW and R AFO to at least 1.28 ft/sec in order to demo improved household mobility.     Baseline 29.92 =  1.09 ft/sec on 09/12/20    Time 8    Period Weeks    Status On-going      PT LONG TERM GOAL #3   Title Pt will perform 5x sit <> stand from standard height mat table with supervision in order to demo improved functional transfers.    Baseline 34.75 seconds with therapist helping to steady RW, performed with supervision without therapist steadying RW    Time 8    Period Weeks    Status Achieved      PT LONG TERM GOAL #4   Title Pt will stand at [facility] with min guard with no UE support for at least 1 minute and 30 seconds in order to demo improved balance    Baseline 32.84 seconds on 09/12/20    Time 8    Status New      PT LONG TERM GOAL #5   Title Pt will ambulate at least 345'' over level surfaces with RW and min guard/min A in order to demo improved household mobility and improved endurance    Baseline 345' distance today with min guard/min A    Time 8    Period Weeks    Status Achieved                   Plan - 11/09/20 1224     Clinical Impression Statement Began to check LTGs today with pt meeting LTG #3 and #5. Pt with improvement in sit <> stands with RW from mat table, able to perform with supervision without therapist needing to steady RW. Pt ambulated 345' today with RW in a single bout with min guard and min A during last lap when pt was more fatigued and pt demonstrating incr knee flexion and forward flexed posture. Remainder of session focused on BLE strengthening, pt tolerated session well. LTGs date updated due to weeks in pt's POC - will check remainder of goals next week and perform re-cert.    Personal Factors and Comorbidities Comorbidity 3+;Past/Current Experience    Comorbidities T8 paraplegia, diabetes, HTN    Examination-Activity Limitations Locomotion Level;Transfers;Stand    Examination-Participation Restrictions Community Activity;Yard Work   Community education officer Evolving/Moderate complexity    Rehab Potential  Good    PT Frequency 2x / week   1-2x   PT Duration 12 weeks   15 visits over 12 weeks   PT Treatment/Interventions Aquatic Therapy;Manual techniques;Therapeutic exercise;Gait training;Neuromuscular re-education;Orthotic Fit/Training    PT Next Visit Plan check remainder of LTGs and perform re-cert. continue to work on gait distance, step ups in parallel bars, side stepping. hip abductor strengthening for decreased scissoring with gait, standing balance with decreased UE support, quadruped/tall kneeling for hip/core strengthening    PT Home Exercise Plan Access Code: [email or web address]    Consulted and Agree with Plan of Care Patient             Patient will benefit from skilled therapeutic intervention in order to improve the following deficits and impairments:  Decreased balance, Decreased coordination, Decreased range of motion, Decreased strength, Decreased mobility, Postural dysfunction, Impaired tone, Decreased activity tolerance, Abnormal gait, Decreased endurance, Difficulty walking, Impaired sensation  Visit Diagnosis: Difficulty in walking, not elsewhere classified  Muscle weakness (generalized)  Unsteadiness on feet  Other symptoms and signs involving the nervous system     Problem List Patient Active Problem List   Diagnosis Date Noted   Erectile dysfunction due to diseases classified elsewhere 03/02/2020   Wheelchair dependence  11/27/2019   Spasticity 08/03/2019   Paraplegia ([place]) 07/07/2019   Neurogenic bowel 07/07/2019   Neurogenic bladder 07/07/2019   Thoracic myelopathy 06/30/2019    Arliss Journey, PT, DPT  11/09/2020, 12:26 PM  [facility] [address] [place], [name], [address] Phone: [phone]   Fax:  [phone]  Name: Timothy Davenport MRN: [id number] Date of Birth: [birth year]

## 2020-11-14 ENCOUNTER — Encounter: Payer: Self-pay | Admitting: Physical Therapy

## 2020-11-14 ENCOUNTER — Ambulatory Visit: Payer: No Typology Code available for payment source | Admitting: Physical Therapy

## 2020-11-14 ENCOUNTER — Other Ambulatory Visit: Payer: Self-pay

## 2020-11-14 DIAGNOSIS — R262 Difficulty in walking, not elsewhere classified: Secondary | ICD-10-CM | POA: Diagnosis not present

## 2020-11-14 DIAGNOSIS — R2681 Unsteadiness on feet: Secondary | ICD-10-CM

## 2020-11-14 DIAGNOSIS — M6281 Muscle weakness (generalized): Secondary | ICD-10-CM

## 2020-11-14 DIAGNOSIS — R29818 Other symptoms and signs involving the nervous system: Secondary | ICD-10-CM

## 2020-11-14 DIAGNOSIS — R293 Abnormal posture: Secondary | ICD-10-CM

## 2020-11-14 NOTE — Therapy (Signed)
[facility] [address] [place], [name], [address] Phone: [phone]   Fax:  [phone]  Physical Therapy Treatment  Patient Details  Name: Timothy Davenport MRN: [id number] Date of Birth: Jun 19, 1950 Referring Provider (PT): Juel Burrow    Encounter Date: 11/14/2020   PT End of Session - 11/14/20 2056     Visit Number 60    Number of Visits 108    Date for PT Re-Evaluation 12/11/20    Authorization Type VA - 15 visits 02/05/20 - 06/04/20, 15 additional visits from 3/8-08/24/20, 15 additional visits through 10/22/20, 15 additional visits through 12/28/20    Authorization - Visit Number 14    Authorization - Number of Visits 15    Progress Note Due on Visit 27    PT Start Time 1400    PT Stop Time 1443    PT Time Calculation (min) 43 min    Equipment Utilized During Treatment Gait belt    Activity Tolerance Patient tolerated treatment well    Behavior During Therapy WFL for tasks assessed/performed             Past Medical History:  Diagnosis Date   Colon polyp    Diabetes mellitus without complication ([place])    Diabetic neuropathy ([place])    HTN (hypertension)    Patella fracture    Renal calculi     Past Surgical History:  Procedure Laterality Date   LUMBAR LAMINECTOMY/DECOMPRESSION MICRODISCECTOMY N/A 07/01/2019   Procedure: LUMBAR LAMINECTOMY/DECOMPRESSION MICRODISCECTOMY 1 LEVEL, THORACIC SIX-SEVEN;  Surgeon: Consuella Lose, MD;  Location: [facility];  Service: Neurosurgery;  Laterality: N/A;  LUMBAR LAMINECTOMY/DECOMPRESSION MICRODISCECTOMY 1 LEVEL, THORACIC SIX-SEVEN    ORIF PATELLA     THORACIC DISCECTOMY N/A 07/03/2019   Procedure: Evacuation of Thoracic Hematoma;  Surgeon: Consuella Lose, MD;  Location: [facility];  Service: Neurosurgery;  Laterality: N/A;    There were no vitals filed for this visit.   Subjective Assessment - 11/14/20 2055     Subjective No new complaints.  No falls or pain to  report.    Pertinent History T8 paraplegia, diabetes, HTN    How long can you stand comfortably? probably a couple minutes with RW.    How long can you walk comfortably? approx. 5-10 minutes with his son and RW.    Patient Stated Goals wants to be able to walk without any assistance              Aquatic therapy at [facility] - pool temp 94 degrees   Patient seen for aquatic therapy today.  Treatment took place in water 3.5-4.5 feet deep depending upon activity.  Pt entered and  Exited the pool via step negotiation with use of bil. Hands rails, step by step with both ascending into pool and descending to exit pool with assist needed for LE lifting/step placement with last two steps out of pool.  Once in the pool pt use wall to walk over to bench in water. Seated on bench for passive hamstring/heel cord stretching in long sitting position for 30 sec's x 3 reps each side.   Gait in the pool holding onto a large bar bell: forward/backward, then side stepping in ~4 foot deep water for 4 laps each. Min assist for balance via bar bell. Cues on posture and step length. Then with hand held bar bells- forward<>backward gait with alternating "punching" at water level for 4 laps across pool with up to min assist for balance.   For  strengthening: seated on bench: sit to stands with feet in parallel position for 10 reps, then in staggered position for 10 reps each foot forward with UE support. With use on aquatic step: lateral step ups, forward step ups and step downs for 10 reps each side with UE support. Cues on form and technique.  For balance: with hand bar bells for shoulder horizontal abduction at water level with emphasis on posture and weight shifting. Attempted to perform shoulder adduction from water level to legs with pt usable to maintain balance for this. Switched to pt holding blue foam noodle out in front of pt- bringing it down to legs and then back up to water with emphasis on posture and  weight shifting. Then holding onto the foam noodle for balance- in single leg stance pt performed contralateral forward>lateral>backward kicks for 2 sets of 5 reps. Min guard to min assist for balance assist with these activities.     Pt requires buoyancy of water for support for reduced fall risk with gait training and balance exercises with minimal UE support; exercises able to be performed safely in water without the risk of fall compared to those same exercises performed on land;  viscosity of water needed for resistance for strengthening.  Current of water provides perturbations for challenging static & dynamic standing balance.           PT Short Term Goals - 11/03/20 1652       PT SHORT TERM GOAL #1   Title Pt will improve gait speed with RW and R AFO to at least 1.18 ft/sec in order to demo improved household mobility. ALL STGS DUE 10/11/20    Baseline 1.09 ft/sec; 1.21 ft/sec on 10/12/20    Time 4    Period Weeks    Status Achieved    Target Date 10/11/20      PT SHORT TERM GOAL #2   Title Pt will stand at [facility] with min guard with no UE support for at least 1 minute in order to demo improved balance for ADLs.    Baseline 32.84 seconds on 09/12/20; standing at RW without UE support: 43.12 seconds with min guard for balance on 10/12/20    Time 4    Period Weeks    Status Not Met      PT SHORT TERM GOAL #3   Title Pt will ambulate at least 230' consistently with RW and min guard throughout in order to demo improved household mobility.    Baseline 08/18/20; pt able to achieve distance with min guard to min assist for balance    Time 4    Period Weeks    Status On-going      PT SHORT TERM GOAL #4   Title Pt will decr 5x sit <> Stand time to 30 seconds or less with UE support from 20" mat table in order to demo improved functional transfers    Baseline 34.75 seconds on 09/13/20; 18.43 seconds on 10/12/20    Time 4    Period Weeks    Status Achieved               PT Long  Term Goals - 11/09/20 1219       PT LONG TERM GOAL #1   Title Pt and spouse will be independent with final HEP for BLE stretching and strengthening. ALL LTGS DUE 11/08/20    Baseline pt will continue to benefit from ongoing additions    Time 8    Period  Weeks    Status On-going      PT LONG TERM GOAL #2   Title Pt will improve gait speed with RW and R AFO to at least 1.28 ft/sec in order to demo improved household mobility.    Baseline 29.92 = 1.09 ft/sec on 09/12/20    Time 8    Period Weeks    Status On-going      PT LONG TERM GOAL #3   Title Pt will perform 5x sit <> stand from standard height mat table with supervision in order to demo improved functional transfers.    Baseline 34.75 seconds with therapist helping to steady RW, performed with supervision without therapist steadying RW    Time 8    Period Weeks    Status Achieved      PT LONG TERM GOAL #4   Title Pt will stand at [facility] with min guard with no UE support for at least 1 minute and 30 seconds in order to demo improved balance    Baseline 32.84 seconds on 09/12/20    Time 8    Status New      PT LONG TERM GOAL #5   Title Pt will ambulate at least 345'' over level surfaces with RW and min guard/min A in order to demo improved household mobility and improved endurance    Baseline 345' distance today with min guard/min A    Time 8    Period Weeks    Status Achieved                   Plan - 11/14/20 2056     Clinical Impression Statement Today's skilled session continued to focus on stretching, strengthening and balance with decreased UE support in the aquatic setting. No issues noted or reported in session today. The pt is making steady progress and should benefit from continued PT to progress toward unmet goals.    Personal Factors and Comorbidities Comorbidity 3+;Past/Current Experience    Comorbidities T8 paraplegia, diabetes, HTN    Examination-Activity Limitations Locomotion Level;Transfers;Stand     Examination-Participation Restrictions Community Activity;Yard Work   Community education officer Evolving/Moderate complexity    Rehab Potential Good    PT Frequency 2x / week   1-2x   PT Duration 12 weeks   15 visits over 12 weeks   PT Treatment/Interventions Aquatic Therapy;Manual techniques;Therapeutic exercise;Gait training;Neuromuscular re-education;Orthotic Fit/Training    PT Next Visit Plan check remainder of LTGs and perform re-cert. continue to work on gait distance, step ups in parallel bars, side stepping. hip abductor strengthening for decreased scissoring with gait, standing balance with decreased UE support, quadruped/tall kneeling for hip/core strengthening    PT Home Exercise Plan Access Code: [email or web address]    Consulted and Agree with Plan of Care Patient             Patient will benefit from skilled therapeutic intervention in order to improve the following deficits and impairments:  Decreased balance, Decreased coordination, Decreased range of motion, Decreased strength, Decreased mobility, Postural dysfunction, Impaired tone, Decreased activity tolerance, Abnormal gait, Decreased endurance, Difficulty walking, Impaired sensation  Visit Diagnosis: Difficulty in walking, not elsewhere classified  Muscle weakness (generalized)  Unsteadiness on feet  Other symptoms and signs involving the nervous system  Abnormal posture     Problem List Patient Active Problem List   Diagnosis Date Noted   Erectile dysfunction due to diseases classified elsewhere 03/02/2020   Wheelchair dependence 11/27/2019   Spasticity 08/03/2019  Paraplegia ([place]) 07/07/2019   Neurogenic bowel 07/07/2019   Neurogenic bladder 07/07/2019   Thoracic myelopathy 06/30/2019    Willow Ora, PTA 11/14/2020, 10:15 PM  [facility] [address] [place], [name], [address] Phone: [phone]   Fax:  [phone]  Name:  SYLVANUS TELFORD MRN: [id number] Date of Birth: 08-May-1950

## 2020-11-15 ENCOUNTER — Encounter: Payer: Self-pay | Admitting: Physical Therapy

## 2020-11-15 ENCOUNTER — Ambulatory Visit: Payer: No Typology Code available for payment source | Admitting: Physical Therapy

## 2020-11-15 DIAGNOSIS — R29818 Other symptoms and signs involving the nervous system: Secondary | ICD-10-CM

## 2020-11-15 DIAGNOSIS — M6281 Muscle weakness (generalized): Secondary | ICD-10-CM

## 2020-11-15 DIAGNOSIS — R2681 Unsteadiness on feet: Secondary | ICD-10-CM

## 2020-11-15 DIAGNOSIS — R262 Difficulty in walking, not elsewhere classified: Secondary | ICD-10-CM | POA: Diagnosis not present

## 2020-11-16 NOTE — Therapy (Addendum)
[facility] [address] [place], [name], [address] Phone: [phone]   Fax:  [phone]  Physical Therapy Treatment/Re-Cert  Patient Details  Name: Timothy Davenport MRN: [id number] Date of Birth: [birth year] Referring Provider (PT): Juel Burrow    Encounter Date: 11/15/2020    11/16/20 0823  PT Visits / Re-Eval  Visit Number 2  Number of Visits 52  Date for PT Re-Evaluation 02/10/21  Authorization  Authorization Type [facility] - 15 additional visits through 12/28/20, awaiting additional auth for 15 visits  Authorization - Visit Number 15  Authorization - Number of Visits 15  Progress Note Due on Visit 2  PT Time Calculation  PT Start Time 1104  PT Stop Time 1145  PT Time Calculation (min) 41 min  PT - End of Session  Equipment Utilized During Treatment Gait belt  Activity Tolerance Patient tolerated treatment well  Behavior During Therapy WFL for tasks assessed/performed     Past Medical History:  Diagnosis Date   Colon polyp    Diabetes mellitus without complication ([place])    Diabetic neuropathy ([place])    HTN (hypertension)    Patella fracture    Renal calculi     Past Surgical History:  Procedure Laterality Date   LUMBAR LAMINECTOMY/DECOMPRESSION MICRODISCECTOMY N/A 07/01/2019   Procedure: LUMBAR LAMINECTOMY/DECOMPRESSION MICRODISCECTOMY 1 LEVEL, THORACIC SIX-SEVEN;  Surgeon: Consuella Lose, MD;  Location: [place];  Service: Neurosurgery;  Laterality: N/A;  LUMBAR LAMINECTOMY/DECOMPRESSION MICRODISCECTOMY 1 LEVEL, THORACIC SIX-SEVEN    ORIF PATELLA     THORACIC DISCECTOMY N/A 07/03/2019   Procedure: Evacuation of Thoracic Hematoma;  Surgeon: Consuella Lose, MD;  Location: [facility];  Service: Neurosurgery;  Laterality: N/A;    There were no vitals filed for this visit.   Subjective Assessment - 11/15/20 1108     Subjective Pool went well yesterday.    Pertinent History T8 paraplegia, diabetes, HTN     How long can you stand comfortably? probably a couple minutes with RW.    How long can you walk comfortably? approx. 5-10 minutes with his son and RW.    Patient Stated Goals wants to be able to walk without any assistance    Currently in Pain? No/denies                [facility] PT Assessment - 11/15/20 1140       Assessment   Medical Diagnosis T8 paraplegia    Referring Provider (PT) Piva, Enrico,DO     Onset Date/Surgical Date 06/30/19      Prior Function   Level of Independence Independent                           [facility] Adult PT Treatment/Exercise - 11/15/20 1109       Ambulation/Gait   Ambulation/Gait Yes    Ambulation/Gait Assistance 4: Min guard    Ambulation Distance (Feet) 150 Feet    Assistive device Rolling walker    Gait Pattern Decreased weight shift to right;Left flexed knee in stance;Right flexed knee in stance;Narrow base of support;Decreased step length - right;Decreased step length - left;Decreased stance time - right;Decreased hip/knee flexion - right;Step-through pattern    Ambulation Surface Level;Indoor    Gait velocity 37.31 seconds = .87 ft/sec      High Level Balance   High Level Balance Comments standing without UE support at RW: 1 minute 4 seconds, with BUE support: 2 x 10 reps head turns, 2  x 10 reps head nods. in // bars with BUE support: with RLE as stance leg tapping LLE to 4" step x7 reps before pt fatigued, 2nd set doing 10 reps, therapist providing min A with PT tech on standby, cues for posture/glute activation and incr hip/knee flexion with RLE. x10 reps RLE hip flexion through pt's available range, cued for posture      Knee/Hip Exercises: Standing   Forward Step Up Both;Hand Hold: 2;Step Height: 4"    Forward Step Up Limitations in parallel bars with BUE support-  keeping RLE as stance leg on step and stepping LLE on and off, repeated with LLE on step, needing min assist to help clear RLE on/off step at times when  fatigued. cues for knee extension and to stand with tall posture to look into mirror. pt needing a seated rest break after each set. min guard/min A throughout, x10 reps each leg.                     PT Education - 11/16/20 [phone]     Education Details results of goals, waiting to hear back from [place] for additional visits    Person(s) Educated Patient    Methods Explanation    Comprehension Verbalized understanding              PT Short Term Goals - 11/03/20 1652       PT SHORT TERM GOAL #1   Title Pt will improve gait speed with RW and R AFO to at least 1.18 ft/sec in order to demo improved household mobility. ALL STGS DUE 10/11/20    Baseline 1.09 ft/sec; 1.21 ft/sec on 10/12/20    Time 4    Period Weeks    Status Achieved    Target Date 10/11/20      PT SHORT TERM GOAL #2   Title Pt will stand at [facility] with min guard with no UE support for at least 1 minute in order to demo improved balance for ADLs.    Baseline 32.84 seconds on 09/12/20; standing at RW without UE support: 43.12 seconds with min guard for balance on 10/12/20    Time 4    Period Weeks    Status Not Met      PT SHORT TERM GOAL #3   Title Pt will ambulate at least 230' consistently with RW and min guard throughout in order to demo improved household mobility.    Baseline 08/18/20; pt able to achieve distance with min guard to min assist for balance    Time 4    Period Weeks    Status On-going      PT SHORT TERM GOAL #4   Title Pt will decr 5x sit <> Stand time to 30 seconds or less with UE support from 20" mat table in order to demo improved functional transfers    Baseline 34.75 seconds on 09/13/20; 18.43 seconds on 10/12/20    Time 4    Period Weeks    Status Achieved            Revised STGs:  PT Short Term Goals - 11/21/20 1140       PT SHORT TERM GOAL #1   Title Pt will undergo further assessment of 2MWT vs. 3MWT with LTG written as appropriate in order to demo improved gait efficiency. ALL  STGS DUE 12/19/20    Time 4    Period Weeks    Status New    Target Date 12/19/20  PT SHORT TERM GOAL #2   Title Pt will stand at [facility] with min guard with no UE support for at least 1 minute 15 seconds in order to demo improved balance for ADLs.    Baseline 1 minute 4 seconds on 11/15/20    Time 4    Period Weeks    Status Revised      PT SHORT TERM GOAL #3   Title Pt will ambulate 20'  with RW and min guard throughout in order to demo improved household mobility.    Baseline 345' with RW, min guard/min A during last lap    Time 4    Period Weeks    Status Revised      PT SHORT TERM GOAL #4   Title In aquatic therapy, pt will be able to descend down pool steps with min guard and ascend out of pool steps with mod A with therapist in order to demo improved functional mobility.    Baseline Min assist to descend into pool and mod to max assist to ascend out of pool at end of session.    Time 4    Period Weeks    Status New                PT Long Term Goals - 11/15/20 1116       PT LONG TERM GOAL #1   Title Pt and spouse will be independent with final HEP for BLE stretching and strengthening. ALL LTGS DUE 11/08/20    Baseline pt will continue to benefit from ongoing additions    Time 8    Period Weeks    Status On-going      PT LONG TERM GOAL #2   Title Pt will improve gait speed with RW and R AFO to at least 1.28 ft/sec in order to demo improved household mobility.    Baseline 29.92 = 1.09 ft/sec on 09/12/20; 37.31 seconds = .87 ft/sec on 11/15/20 (pt more tired from aquatic therapy yesterday)    Time 8    Period Weeks    Status Not Met      PT LONG TERM GOAL #3   Title Pt will perform 5x sit <> stand from standard height mat table with supervision in order to demo improved functional transfers.    Baseline 34.75 seconds with therapist helping to steady RW, performed with supervision without therapist steadying RW    Time 8    Period Weeks    Status Achieved       PT LONG TERM GOAL #4   Title Pt will stand at [facility] with min guard with no UE support for at least 1 minute and 30 seconds in order to demo improved balance    Baseline 32.84 seconds on 09/12/20; 1 minute 4 seconds on 11/15/20    Time 8    Status Not Met      PT LONG TERM GOAL #5   Title Pt will ambulate at least 345'' over level surfaces with RW and min guard/min A in order to demo improved household mobility and improved endurance    Baseline 345' distance today with min guard/min A    Time 8    Period Weeks    Status Achieved             Revised LTGs:    PT Long Term Goals - 11/21/20 1144       PT LONG TERM GOAL #1   Title Pt and spouse will be  independent with final HEP for BLE stretching and strengthening. ALL LTGS DUE 01/16/21    Baseline pt will continue to benefit from ongoing additions    Time 8    Period Weeks    Status On-going    Target Date 01/16/21      PT LONG TERM GOAL #2   Title Pt will improve gait speed with RW and R AFO to at least 1.28 ft/sec in order to demo improved household mobility.    Baseline 29.92 = 1.09 ft/sec on 09/12/20; 37.31 seconds = .87 ft/sec on 11/15/20 (pt more tired from aquatic therapy yesterday)    Time 8    Period Weeks    Status On-going      PT LONG TERM GOAL #3   Title 2MWT vs. 3MWT goal to be written in order to demo improved gait efficiency.    Time 8    Period Weeks    Status New      PT LONG TERM GOAL #4   Title Pt will stand at [facility] with min guard with no UE support for at least 1 minute and 30 seconds in order to demo improved balance    Baseline 32.84 seconds on 09/12/20; 1 minute 4 seconds on 11/15/20    Time 8    Status On-going      PT LONG TERM GOAL #5   Title Pt will ambulate at least 230' over level surfaces with RW and supervision/min guard in order to demo improved household mobility and improved endurance    Baseline 345' distance today with min guard/min A    Time 8    Period Weeks    Status Revised                    11/21/20 1133  Plan  Clinical Impression Statement Today's skilled session focused on assessing remainder of pt's LTGs for re-cert. Pt did not meet LTGs #2 and #4. Pt's gait speed with RW and R AFO decr to .87 ft/sec (previously was 1.09 ft/sec). Pt more fatigued today after having aquatic therapy session yesterday. Pt did improve time pt able to stand at [facility] with no UE support to 1 minute and 4 seconds, with min guard from therapist (previously 32.84 seconds), but not quite to goal level. When assessed last week pt able to meet LTG #3 in regards to sit <> stands with RW and pt able to perform with supervision and LTG #5 in regards to ambulating 345' with min guard and needing min A during last lap at times due to fatigue with pt having decr RLE foot clearance and incr foward flexed posture. Pt to go to [employer] soon to get R leather toe cap. Pt has made excellent progress with PT with his functional mobility and gait distance. Will re-cert for an additional 15 visits (awaiting [facility] auth) to continue to work on ROM, strengthening, balance, gait, transfers, in order to improve pt's functional mobility and independence.  Personal Factors and Comorbidities Comorbidity 3+;Past/Current Experience  Comorbidities T8 paraplegia, diabetes, HTN  Examination-Activity Limitations Locomotion Level;Transfers;Stand  Examination-Participation Restrictions Community Activity;Yard Work (gardening)  Pt will benefit from skilled therapeutic intervention in order to improve on the following deficits Decreased balance;Decreased coordination;Decreased range of motion;Decreased strength;Decreased mobility;Postural dysfunction;Impaired tone;Decreased activity tolerance;Abnormal gait;Decreased endurance;Difficulty walking;Impaired sensation  Stability/Clinical Decision Making Evolving/Moderate complexity  Rehab Potential Good  PT Frequency 2x / week (1-2x)  PT Duration 12 weeks (15 visits over 12 weeks)  PT  Treatment/Interventions Aquatic  Therapy;Manual techniques;Therapeutic exercise;Gait training;Neuromuscular re-education;Orthotic Fit/Training  PT Next Visit Plan continue to work on gait distance, step ups in parallel bars, side stepping. hip abductor strengthening for decreased scissoring with gait, standing balance with decreased UE support, quadruped/tall kneeling for hip/core strengthening  PT Home Exercise Plan Access Code: [email or web address]  Consulted and Agree with Plan of Care Patient     Patient will benefit from skilled therapeutic intervention in order to improve the following deficits and impairments:     Visit Diagnosis: Difficulty in walking, not elsewhere classified  Muscle weakness (generalized)  Unsteadiness on feet  Other symptoms and signs involving the nervous system     Problem List Patient Active Problem List   Diagnosis Date Noted   Erectile dysfunction due to diseases classified elsewhere 03/02/2020   Wheelchair dependence 11/27/2019   Spasticity 08/03/2019   Paraplegia ([place]) 07/07/2019   Neurogenic bowel 07/07/2019   Neurogenic bladder 07/07/2019   Thoracic myelopathy 06/30/2019    Arliss Journey, PT, DPT  11/16/2020, 8:27 AM  [facility] [address] [place], [name], [address] Phone: [phone]   Fax:  [phone]  Name: Timothy Davenport MRN: [id number] Date of Birth: 1950/05/04

## 2020-11-21 NOTE — Addendum Note (Signed)
Addended by: Arliss Journey on: 11/21/2020 11:46 AM   Modules accepted: Orders

## 2020-11-30 ENCOUNTER — Other Ambulatory Visit: Payer: Self-pay

## 2020-11-30 ENCOUNTER — Encounter: Payer: Self-pay | Admitting: Physical Medicine and Rehabilitation

## 2020-11-30 ENCOUNTER — Encounter
Payer: BC Managed Care – PPO | Attending: Physical Medicine and Rehabilitation | Admitting: Physical Medicine and Rehabilitation

## 2020-11-30 VITALS — BP 111/71 | HR 66 | Temp 98.5°F | Ht 71.0 in

## 2020-11-30 DIAGNOSIS — N319 Neuromuscular dysfunction of bladder, unspecified: Secondary | ICD-10-CM | POA: Insufficient documentation

## 2020-11-30 DIAGNOSIS — Z993 Dependence on wheelchair: Secondary | ICD-10-CM | POA: Diagnosis present

## 2020-11-30 DIAGNOSIS — R252 Cramp and spasm: Secondary | ICD-10-CM | POA: Diagnosis present

## 2020-11-30 DIAGNOSIS — N521 Erectile dysfunction due to diseases classified elsewhere: Secondary | ICD-10-CM | POA: Diagnosis present

## 2020-11-30 DIAGNOSIS — G8220 Paraplegia, unspecified: Secondary | ICD-10-CM | POA: Diagnosis not present

## 2020-11-30 DIAGNOSIS — K592 Neurogenic bowel, not elsewhere classified: Secondary | ICD-10-CM | POA: Insufficient documentation

## 2020-11-30 NOTE — Patient Instructions (Addendum)
Awake, alert, appropriate, accompanied by wife; N- in manual w/c; NAD  MS: RLE- HF 2-/5, KE 2/5, DF 3/5 and PF 3/5 LLE- HF 3/5, KE 3/5; DF and PF 4/5  Neuro: MAS of 4 in B/L knees- Hips MAS of 2; and ankles MAS of 1+ to 2 Scab on R forearm- looks OK.   B/L knees- no swelling/effusion- no increased warm or redness.     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 paraplegia- neurogenic bowel and bladder and resolving  spasticity here for f/u on SCI and associated issues.    Discussed intimacy and how it's so important for marriage to last! Use Viagra if both are on board.   2.  Pt really could benefit from Botox- for B/L knees- Knee flexion and extension both affected, but knee flexion much worse- Hip abduction and adduction and hip flexion much less than knees. If [facility] cannot do, let me know, and will have a colleague do the Botox.  Is using his tone to stand, so goal of  spasticity treatment- MAS is 2- not 0!  3. Also needs Xrays of B/L knees to determine if developing heterotopic ossification in knees, which could complicate, make tone worse. Please have [facility] do if possible, but if not, I'm happy to do.   4.  Getting meds through [place]- doesn't need refills- con't Baclofen for now. Seeing Deidre NP at [facility] in 2 weeks.   5. He might benefit from Dantrolene for spasticity instead of Baclofen- can get to a higher dose usually without as much sedation- suggest 50 mg QHS x 1 week, then BID x 1 week then TID x 1 week, then 100 mg BID for spasticity- do have to check LFTS at 1 month, 3 months and q6 months while on Dantrolene- I can do these labs for pt.    6. Call me and let me know plan after sees [facility] on 10[facility].    7. Bowel and bladder are OK- progressing.   8. F/U in 3 months- double visit- for spasticity issues.

## 2020-11-30 NOTE — Progress Notes (Signed)
Subjective:    Patient ID: Timothy Davenport, male    DOB: [birth year], 70 y.o.   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 paraplegia- neurogenic bowel and bladder and resolving  spasticity here for f/u on SCI and associated issues.   Getting better and better!  Still cathing q4 hours.  Still using bowel program- every other night now- sometimes goes on his own.   Hasn't gotten hand controls for his truck- still pending- 1 year since starting process.    Jardiance now- only taking night insulin shot now-  doing 8 units- Lantus.  BG's been ideal- 80s- 120s usually- rare up to 150- but much better usually. Went over BG log he was keeping.    Spasms- having some- not as much as before- Baclofen now 20 mg BID- not TID.    Hasn't used Rx from [place] for Viagra.   Is doing some intimacy.   Is cooking more now- gets up to grill and cooked meatloaf again.  Leaving for beach tomorrow- has beach chair so can get to fish on beach!  Has annual at [facility] 12/09/20.   Pain Inventory Average Pain 1 Pain Right Now 0 My pain is  no pain  LOCATION OF PAIN  legs  BOWEL Number of stools per week: 3 Oral laxative use Yes  Type of laxative Miralax Enema or suppository use Yes  History of colostomy No  Incontinent No   BLADDER  In and out cath, frequency every 4 hours Able to self cath Yes    Mobility use a walker ability to climb steps?  yes do you drive?  no transfers alone Do you have any goals in this area?  yes  Function retired I need assistance with the following:  dressing, meal prep, and household duties Do you have any goals in this area?  yes  Neuro/Psych bladder control problems trouble walking spasms  Prior Studies Any changes since last visit?  no  Physicians involved in your care Any changes since last visit?  no   Family History  Problem Relation Age of Onset   Hypertension Mother    Hypertension Father     Social History   Socioeconomic History   Marital status: Married    Spouse name: Not on file   Number of children: Not on file   Years of education: Not on file   Highest education level: Not on file  Occupational History   Not on file  Tobacco Use   Smoking status: Never   Smokeless tobacco: Never  Vaping Use   Vaping Use: Never used  Substance and Sexual Activity   Alcohol use: Yes    Comment: 2-3 drinks week   Drug use: Yes    Types: Marijuana    Comment: occasionally   Sexual activity: Not Currently  Other Topics Concern   Not on file  Social History Narrative   Not on file   Social Determinants of Health   Financial Resource Strain: Not on file  Food Insecurity: Not on file  Transportation Needs: Not on file  Physical Activity: Not on file  Stress: Not on file  Social Connections: Not on file   Past Surgical History:  Procedure Laterality Date   LUMBAR LAMINECTOMY/DECOMPRESSION MICRODISCECTOMY N/A 07/01/2019   Procedure: LUMBAR LAMINECTOMY/DECOMPRESSION MICRODISCECTOMY 1 LEVEL, THORACIC SIX-SEVEN;  Surgeon: Consuella Lose, MD;  Location: [place];  Service: Neurosurgery;  Laterality: N/A;  LUMBAR LAMINECTOMY/DECOMPRESSION MICRODISCECTOMY 1 LEVEL, THORACIC SIX-SEVEN    ORIF PATELLA     THORACIC DISCECTOMY N/A 07/03/2019   Procedure: Evacuation of Thoracic Hematoma;  Surgeon: Consuella Lose, MD;  Location: [place];  Service: Neurosurgery;  Laterality: N/A;   Past Medical History:  Diagnosis Date   Colon polyp    Diabetes mellitus without complication ([facility])    Diabetic neuropathy ([facility])    HTN (hypertension)    Patella fracture    Renal calculi    BP 111/71   Pulse 66   Temp 98.5 F (36.9 C)   Ht 5\' 11"  (1.803 m)   SpO2 96%   BMI 25.58 kg/m   Opioid Risk Score:   Fall Risk Score:  `1  Depression screen PHQ 2/9  Depression screen [facility] 2/9 03/02/2020 11/27/2019 08/03/2019  Decreased Interest 1 1 3   Down, Depressed, Hopeless 1 1 0  PHQ - 2 Score 2  2 3   Altered sleeping - - 0  Tired, decreased energy - - 0  Change in appetite - - 1  Feeling bad or failure about yourself  - - 0  Trouble concentrating - - 0  Moving slowly or fidgety/restless - - 0  Suicidal thoughts - - 0  PHQ-9 Score - - 4  Difficult doing work/chores - - Not difficult at all    Review of Systems  Musculoskeletal:  Positive for gait problem.       Spasms  All other systems reviewed and are negative.     Objective:   Physical Exam Awake, alert, appropriate, accompanied by wife; N- in manual w/c; NAD  MS: RLE- HF 2-/5, KE 2/5, DF 3/5 and PF 3/5 LLE- HF 3/5, KE 3/5; DF and PF 4/5  Neuro: MAS of 4 in B/L knees- Hips MAS of 2; and ankles MAS of 1+ to 2 Scab on R forearm- looks OK.   B/L knees- no swelling/effusion- no increased warm or redness.        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 paraplegia- neurogenic bowel and bladder and resolving  spasticity here for f/u on SCI and associated issues.    Discussed intimacy and how it's so important for marriage to last! Use Viagra if both are on board.   2.  Pt really could benefit from Botox- for B/L knees- Knee flexion and extension both affected, but knee flexion much worse- Hip abduction and adduction and hip flexion much less than knees. If [facility] cannot do, let me know, and will have a colleague do the Botox.  Is using his tone to stand, so goal of  spasticity treatment- MAS is 2- not 0!  3. Also needs Xrays of B/L knees to determine if developing heterotopic ossification in knees, which could complicate, make tone worse. Please have [facility] do if possible, but if not, I'm happy to do.   4.  Getting meds through [place]- doesn't need refills- con't Baclofen for now. Seeing Deidre NP at [facility] in 2 weeks.   5. He might benefit from Dantrolene for spasticity instead of Baclofen- can get to a higher dose usually without as much sedation- suggest 50 mg QHS x 1 week,  then BID x 1 week then TID x 1 week, then 100 mg BID for spasticity- do have to check LFTS at 1 month, 3 months and q6 months while on Dantrolene- I can do these labs for pt.  6. Call me and let me know plan after sees [facility] on 10/21.    7. Bowel and bladder are OK- progressing.   8. F/U in 3 months- double visit- for spasticity issues.    I spent a total of 33 minutes on visit- discussing spasticity and how to treat- whether it be Botox or Dantrolene and educated on spasticity.

## 2020-12-05 ENCOUNTER — Encounter: Payer: BC Managed Care – PPO | Admitting: Physical Medicine and Rehabilitation

## 2020-12-05 ENCOUNTER — Encounter: Payer: Self-pay | Admitting: Physical Medicine and Rehabilitation

## 2020-12-05 ENCOUNTER — Other Ambulatory Visit: Payer: Self-pay

## 2020-12-05 VITALS — BP 133/81 | HR 56 | Temp 98.5°F | Ht 71.0 in

## 2020-12-05 DIAGNOSIS — R252 Cramp and spasm: Secondary | ICD-10-CM | POA: Diagnosis not present

## 2020-12-05 DIAGNOSIS — G8220 Paraplegia, unspecified: Secondary | ICD-10-CM

## 2020-12-05 DIAGNOSIS — Z993 Dependence on wheelchair: Secondary | ICD-10-CM | POA: Diagnosis not present

## 2020-12-05 MED ORDER — DANTROLENE SODIUM 50 MG PO CAPS: 50.0000 mg | ORAL_CAPSULE | Freq: Every day | ORAL | 3 refills | Status: DC

## 2020-12-05 NOTE — Progress Notes (Signed)
Subjective:    Patient ID: Timothy Davenport, male    DOB: [birth year], 70 y.o.   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 paraplegia- neurogenic bowel and bladder and resolving  spasticity here for f/u on SCI and associated issues.   Had an episode-  Late [place].  [place]- at [place] - was about to walk into bathroom- not wide enough to get a w/c- and had to turn walker sideways to get through door and when did this, his R knee buckled.     Pain Inventory Average Pain 0 Pain Right Now 0 My pain is intermittent and right knee spasms  LOCATION OF PAIN  Right Knee  BOWEL Number of stools per week: 3  Oral laxative use Yes  Type of laxative Miralax Enema or suppository use Yes  History of colostomy No  Incontinent No   BLADDER IN & OUT Caths every 4 hours   Mobility walk without assistance ability to climb steps?  yes do you drive?  no use a wheelchair transfers alone Do you have any goals in this area?  yes  Function Retired disable 01/09/2020  Neuro/Psych spasms anxiety  Prior Studies Any changes since last visit?  no  Physicians involved in your care Any changes since last visit?  no   Family History  Problem Relation Age of Onset   Hypertension Mother    Hypertension Father    Social History   Socioeconomic History   Marital status: Married    Spouse name: Not on file   Number of children: Not on file   Years of education: Not on file   Highest education level: Not on file  Occupational History   Not on file  Tobacco Use   Smoking status: Never   Smokeless tobacco: Never  Vaping Use   Vaping Use: Never used  Substance and Sexual Activity   Alcohol use: Yes    Comment: 2-3 drinks week   Drug use: Yes    Types: Marijuana    Comment: occasionally   Sexual activity: Not Currently  Other Topics Concern   Not on file  Social History Narrative   Not on file   Social Determinants  of Health   Financial Resource Strain: Not on file  Food Insecurity: Not on file  Transportation Needs: Not on file  Physical Activity: Not on file  Stress: Not on file  Social Connections: Not on file   Past Surgical History:  Procedure Laterality Date   LUMBAR LAMINECTOMY/DECOMPRESSION MICRODISCECTOMY N/A 07/01/2019   Procedure: LUMBAR LAMINECTOMY/DECOMPRESSION MICRODISCECTOMY 1 LEVEL, THORACIC SIX-SEVEN;  Surgeon: Consuella Lose, MD;  Location: [facility];  Service: Neurosurgery;  Laterality: N/A;  LUMBAR LAMINECTOMY/DECOMPRESSION MICRODISCECTOMY 1 LEVEL, THORACIC SIX-SEVEN    ORIF PATELLA     THORACIC DISCECTOMY N/A 07/03/2019   Procedure: Evacuation of Thoracic Hematoma;  Surgeon: Consuella Lose, MD;  Location: [facility];  Service: Neurosurgery;  Laterality: N/A;   Past Medical History:  Diagnosis Date   Colon polyp    Diabetes mellitus without complication (HCC)    Diabetic neuropathy (HCC)    HTN (hypertension)    Patella fracture    Renal calculi    There were no vitals taken for this visit.  Opioid Risk Score:   Fall Risk Score:  `1  Depression screen PHQ 2/9  Depression screen [facility] 2/9 11/30/2020 03/02/2020 11/27/2019 08/03/2019  Decreased Interest 0 1 1  3  Down, Depressed, Hopeless 0 1 1 0  PHQ - 2 Score 0 2 2 3   Altered sleeping - - - 0  Tired, decreased energy - - - 0  Change in appetite - - - 1  Feeling bad or failure about yourself  - - - 0  Trouble concentrating - - - 0  Moving slowly or fidgety/restless - - - 0  Suicidal thoughts - - - 0  PHQ-9 Score - - - 4  Difficult doing work/chores - - - Not difficult at all    Review of Systems  Constitutional:  Positive for appetite change.       Poor appetite  Musculoskeletal:  Positive for gait problem.  All other systems reviewed and are negative.     Objective:   Physical Exam Awake, alert, appropriate, in manual w/c, accompanied by wife, NAD MAS of 4- even worse than last visit-  In R knee- so VERY  tight- extremely hard to move R >L knee to go through ROM- is lacking- ~ 20 degrees full knee extension on R- lacking ~ 5 degrees ROM on L knee.  No TTP over R or L knee on exam- no swelling- no bruising on knees. Patella moves appropriately.      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 paraplegia- neurogenic bowel and bladder and resolving  spasticity here for f/u on SCI and associated issues.    We discussed that his spasticity is so severe, that he's actually using his tone to walk- so tone can "let loose" sometimes- I think that's what occurred- that his spasticity was pushed too far and it can be worse in early morning and late evening/night time- and because strength isn't what holds his R leg extended, it's tone, it didn't stay stable.  We really need to decrease his tone- 50/50 chance that can strengthen the legs more.    Will start Dantrolene 50 mg nightly x 1 week Then 50 mg 2x/day x 1 week Then 50 mg  3x/day or 3 pills in a  day- so can take 1 in am and 2 at night x 1 week Then 100 mg 2x/day- for spasticity Side effects- loose stools, sedation, and possible mild weakness Needs CMP/LFTs/labs checked in 1 month, 3 months and every 6 months while on medication  7. Hopeful that can wean off Baclofen if doing well with spasticity control.  8. Some of my patients need both meds 9. If this isn't enough, can increase Dantrolene at next visit or talk about Botox more.  10. Speak to [place] here about getting referral to me/this practice- and Dr Letta Pate for possible Botox for spasticity.  29. Have [facility] check CMP at yearly annual - because will be ~ 1 month November 21.  12. F/U with me in January for 3 month CMP and f/u.   I spent a total of 25 minutes going over plan as detailed above.

## 2020-12-05 NOTE — Patient Instructions (Addendum)
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 paraplegia- neurogenic bowel and bladder and resolving  spasticity here for f/u on SCI and associated issues.    We discussed that his spasticity is so severe, that he's actually using his tone to walk- so tone can "let loose" sometimes- I think that's what occurred- that his spasticity was pushed too far and it can be worse in early morning and late evening/night time- and because strength isn't what holds his R leg extended, it's tone, it didn't stay stable.  We really need to decrease his tone- 50/50 chance that can strengthen the legs more.    Will start Dantrolene 50 mg nightly x 1 week Then 50 mg 2x/day x 1 week Then 50 mg  3x/day or 3 pills in a  day- so can take 1 in am and 2 at night x 1 week Then 100 mg 2x/day- for spasticity Side effects- loose stools, sedation, and possible mild weakness Needs CMP/LFTs/labs checked in 1 month, 3 months and every 6 months while on medication  7. Hopeful that can wean off Baclofen if doing well with spasticity control.  8. Some of my patients need both meds 9. If this isn't enough, can increase Dantrolene at next visit or talk about Botox more.  Can also have [facility] increase Dantrolene to 100 mg 3x/day if need be- if want [facility], that's fine/good- but let me know.  10. Speak to [place] here about getting referral to me/this practice- and Dr Letta Pate for possible Botox for spasticity.  39. Have [facility] check CMP at yearly annual - because will be ~ 1 month November 21.  12. F/U with me in January for 3 month CMP and f/u.

## 2020-12-14 ENCOUNTER — Ambulatory Visit: Payer: No Typology Code available for payment source | Admitting: Physical Therapy

## 2020-12-21 ENCOUNTER — Telehealth: Payer: Self-pay

## 2020-12-21 ENCOUNTER — Other Ambulatory Visit: Payer: Self-pay

## 2020-12-21 ENCOUNTER — Encounter: Payer: Self-pay | Admitting: Physical Therapy

## 2020-12-21 ENCOUNTER — Ambulatory Visit: Payer: No Typology Code available for payment source | Attending: Family Medicine | Admitting: Physical Therapy

## 2020-12-21 DIAGNOSIS — R293 Abnormal posture: Secondary | ICD-10-CM | POA: Insufficient documentation

## 2020-12-21 DIAGNOSIS — R2681 Unsteadiness on feet: Secondary | ICD-10-CM | POA: Insufficient documentation

## 2020-12-21 DIAGNOSIS — M6281 Muscle weakness (generalized): Secondary | ICD-10-CM | POA: Insufficient documentation

## 2020-12-21 DIAGNOSIS — R29818 Other symptoms and signs involving the nervous system: Secondary | ICD-10-CM | POA: Diagnosis present

## 2020-12-21 DIAGNOSIS — R262 Difficulty in walking, not elsewhere classified: Secondary | ICD-10-CM | POA: Insufficient documentation

## 2020-12-21 NOTE — Therapy (Signed)
[facility] [address] [place], [name], [address] Phone: [phone]   Fax:  [phone]  Physical Therapy Treatment  Patient Details  Name: Timothy Davenport MRN: [id number] Date of Birth: 06-23-1950 Referring Provider (PT): Juel Burrow    Encounter Date: 12/21/2020   PT End of Session - 12/21/20 1021     Visit Number 62    Number of Visits 42    Date for PT Re-Evaluation 02/10/21    [facility] Type [facility] - [facility] additional visits through 12/28/20, additional auth for 15 visits.    Authorization - Visit Number 1    Authorization - Number of Visits 15    Progress Note Due on Visit 56    PT Start Time 1019    PT Stop Time 1100    PT Time Calculation (min) 41 min    Equipment Utilized During Treatment Gait belt    Activity Tolerance Patient tolerated treatment well    Behavior During Therapy WFL for tasks assessed/performed             Past Medical History:  Diagnosis Date   Colon polyp    Diabetes mellitus without complication ([place])    Diabetic neuropathy ([place])    HTN (hypertension)    Patella fracture    Renal calculi     Past Surgical History:  Procedure Laterality Date   LUMBAR LAMINECTOMY/DECOMPRESSION MICRODISCECTOMY N/A 07/01/2019   Procedure: LUMBAR LAMINECTOMY/DECOMPRESSION MICRODISCECTOMY 1 LEVEL, THORACIC SIX-SEVEN;  Surgeon: Consuella Lose, MD;  Location: [facility];  Service: Neurosurgery;  Laterality: N/A;  LUMBAR LAMINECTOMY/DECOMPRESSION MICRODISCECTOMY 1 LEVEL, THORACIC SIX-SEVEN    ORIF PATELLA     THORACIC DISCECTOMY N/A 07/03/2019   Procedure: Evacuation of Thoracic Hematoma;  Surgeon: Consuella Lose, MD;  Location: [facility];  Service: Neurosurgery;  Laterality: N/A;    There were no vitals filed for this visit.   Subjective Assessment - 12/21/20 1023     Subjective Had an issue getting more visits authorized from the [place]. Was taking side steps with his RW to get into the bathroom  when he was at the beach,reports R leg gave out and needed help from his wife to get up. Due to severe spasticity, Dr. Dagoberto Ligas started him on Dantrolene, started it about a week ago. When he was at the beach got to go fishing in a handicapped accessible w/c. Got his leather toe cap on his R shoe.    Pertinent History T8 paraplegia, diabetes, HTN    How long can you stand comfortably? probably a couple minutes with RW.    How long can you walk comfortably? approx. 5-10 minutes with his son and RW.    Patient Stated Goals wants to be able to walk without any assistance    Currently in Pain? No/denies                               [facility] Adult PT Treatment/Exercise - 12/21/20 1037       Transfers   Transfers Sit to Stand;Stand to Sit    Sit to Stand 5: Supervision;With upper extremity assist;4: Min guard    Stand to Sit 5: Supervision;With upper extremity assist;4: Min guard      Ambulation/Gait   Ambulation/Gait Yes    Ambulation/Gait Assistance 4: Min guard;4: Min assist    Ambulation/Gait Assistance Details With RW, pt with more narrow BOS with RLE and getting LLE stuck behind R, pt  able to self correct on his own. Pt fatiguing towards end of bout of 2nd lap with decr foot clerance with RLE and incr forward flexed posture. Cues for posture and for incr hip/knee extension and to stop and reset. Needed min A for end of gait and for turning back to sit down to mat table with decr RLE step length and pt on his R toes making it difficult to weight shift    Ambulation Distance (Feet) 230 Feet    Assistive device Rolling walker    Gait Pattern Decreased weight shift to right;Left flexed knee in stance;Right flexed knee in stance;Narrow base of support;Decreased step length - right;Decreased step length - left;Decreased stance time - right;Decreased hip/knee flexion - right;Step-through pattern    Ambulation Surface Level;Indoor      Neuro Re-ed    Neuro Re-ed Details  With kaye  bench: tall kneeling mini squats 2 x 5 reps - verbal and tactile cues for quad/glute activation, needing a rest break over bench between sets, holding in tall kneel position: x5 reps alternating UE lifts. Seated on green balance disc: alternating LAQs x5 reps (PT needing to assist with lifting RLE). Seated on green balance disc holding 3# ball: x5 reps trunk rotations bilat, x5 reps overhead reach - cues to follow ball with head/eyes      Exercises   Exercises Other Exercises    Other Exercises  V sitting with reaching forwards for hamstring/hip ADD stretch 3 x 30 seconds, additional calf stretch performed                       PT Short Term Goals - 12/21/20 1420       PT SHORT TERM GOAL #1   Title Pt will undergo further assessment of 2MWT vs. 3MWT with LTG written as appropriate in order to demo improved gait efficiency. ALL STGS DUE 01/18/21    Time 4    Period Weeks    Status New    Target Date 01/18/21      PT SHORT TERM GOAL #2   Title Pt will stand at [facility] with min guard with no UE support for at least 1 minute 15 seconds in order to demo improved balance for ADLs.    Baseline 1 minute 4 seconds on 11/15/20    Time 4    Period Weeks    Status Revised      PT SHORT TERM GOAL #3   Title Pt will ambulate 67'  with RW and min guard throughout in order to demo improved household mobility.    Baseline 345' with RW, min guard/min A during last lap    Time 4    Period Weeks    Status Revised      PT SHORT TERM GOAL #4   Title In aquatic therapy, pt will be able to descend down pool steps with min guard and ascend out of pool steps with mod A with therapist in order to demo improved functional mobility.    Baseline Min assist to descend into pool and mod to max assist to ascend out of pool at end of session.    Time 4    Period Weeks    Status New               PT Long Term Goals - 12/21/20 1421       PT LONG TERM GOAL #1   Title Pt and spouse will be  independent with  final HEP for BLE stretching and strengthening. ALL LTGS DUE 02/15/21    Baseline pt will continue to benefit from ongoing additions    Time 8    Period Weeks    Status On-going    Target Date 02/15/21      PT LONG TERM GOAL #2   Title Pt will improve gait speed with RW and R AFO to at least 1.28 ft/sec in order to demo improved household mobility.    Baseline 29.92 = 1.09 ft/sec on 09/12/20; 37.31 seconds = .87 ft/sec on 11/15/20 (pt more tired from aquatic therapy yesterday)    Time 8    Period Weeks    Status On-going      PT LONG TERM GOAL #3   Title 2MWT vs. 3MWT goal to be written in order to demo improved gait efficiency.    Time 8    Period Weeks    Status New      PT LONG TERM GOAL #4   Title Pt will stand at [facility] with min guard with no UE support for at least 1 minute and 30 seconds in order to demo improved balance    Baseline 32.84 seconds on 09/12/20; 1 minute 4 seconds on 11/15/20    Time 8    Status On-going      PT LONG TERM GOAL #5   Title Pt will ambulate at least 230' over level surfaces with RW and supervision/min guard in order to demo improved household mobility and improved endurance    Baseline 345' distance today with min guard/min A    Time 8    Period Weeks    Status Revised                   Plan - 12/21/20 1421     Clinical Impression Statement Pt returns today for PT (last visit was over a month ago). Took increased time to get [place] authorization for an additional 15 visits. STGs were due, but since pt has not been seen, pushed STG and LTG dates back in POC.Pt only able to ambulate 230' today with RW- began with min guard but needed min A at end of bout due to fatigue and needing help with RLE foot clearance. Pt with incr forward flexed posture with incr B knee flexion and hip flexion. Prior pt was able to ambulate 345' and needed min A at end of that lap. Pt did get a toe cap put on RLE. Noted it did help with foot clearance and  not getting R foot stuck, esp during initial lap of gait. Will continue to progress towards LTGs.    Personal Factors and Comorbidities Comorbidity 3+;Past/Current Experience    Comorbidities T8 paraplegia, diabetes, HTN    Examination-Activity Limitations Locomotion Level;Transfers;Stand    Examination-Participation Restrictions Community Activity;Yard Work   Community education officer Evolving/Moderate complexity    Rehab Potential Good    PT Frequency 2x / week   1-2x   PT Duration 12 weeks   15 visits over 12 weeks   PT Treatment/Interventions Aquatic Therapy;Manual techniques;Therapeutic exercise;Gait training;Neuromuscular re-education;Orthotic Fit/Training    PT Next Visit Plan continue to work on gait distance, step ups in parallel bars, side stepping. hip abductor strengthening for decreased scissoring with gait, standing balance with decreased UE support, quadruped/tall kneeling for hip/core strengthening    PT Home Exercise Plan Access Code: [email or web address]    Consulted and Agree with Plan of Care Patient  Patient will benefit from skilled therapeutic intervention in order to improve the following deficits and impairments:  Decreased balance, Decreased coordination, Decreased range of motion, Decreased strength, Decreased mobility, Postural dysfunction, Impaired tone, Decreased activity tolerance, Abnormal gait, Decreased endurance, Difficulty walking, Impaired sensation  Visit Diagnosis: Muscle weakness (generalized)  Difficulty in walking, not elsewhere classified  Unsteadiness on feet  Other symptoms and signs involving the nervous system     Problem List Patient Active Problem List   Diagnosis Date Noted   Erectile dysfunction due to diseases classified elsewhere 03/02/2020   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

## 2020-12-21 NOTE — Telephone Encounter (Signed)
Mrs. Cutrone called:   [facility] not [facility]. He did have xrays and the Dantrolene will be prescribed by the [place].   Patient advised to have a copy the xray report sent to you for review.

## 2020-12-26 ENCOUNTER — Ambulatory Visit: Payer: No Typology Code available for payment source | Admitting: Physical Therapy

## 2020-12-28 ENCOUNTER — Ambulatory Visit: Payer: No Typology Code available for payment source | Admitting: Physical Therapy

## 2020-12-28 ENCOUNTER — Other Ambulatory Visit: Payer: Self-pay

## 2020-12-28 ENCOUNTER — Encounter: Payer: Self-pay | Admitting: Physical Therapy

## 2020-12-28 DIAGNOSIS — M6281 Muscle weakness (generalized): Secondary | ICD-10-CM | POA: Diagnosis not present

## 2020-12-28 DIAGNOSIS — R2681 Unsteadiness on feet: Secondary | ICD-10-CM

## 2020-12-28 DIAGNOSIS — R29818 Other symptoms and signs involving the nervous system: Secondary | ICD-10-CM

## 2020-12-28 DIAGNOSIS — R262 Difficulty in walking, not elsewhere classified: Secondary | ICD-10-CM

## 2020-12-28 NOTE — Therapy (Signed)
[facility] [address] [place], [name], [address] Phone: [phone]   Fax:  [phone]  Physical Therapy Treatment  Patient Details  Name: Timothy Davenport MRN: [id number] Date of Birth: [birth year] Referring Provider (PT): Juel Burrow    Encounter Date: 12/28/2020   PT End of Session - 12/28/20 1104     Visit Number 63    Number of Visits 69    Date for PT Re-Evaluation 02/10/21    Authorization Type VA - 15 additional visits through 12/28/20, additional auth for 15 visits.    Authorization - Visit Number 1    Authorization - Number of Visits 15    Progress Note Due on Visit 90    PT Start Time 1100    PT Stop Time 1142    PT Time Calculation (min) 42 min    Equipment Utilized During Treatment Gait belt    Activity Tolerance Patient tolerated treatment well    Behavior During Therapy WFL for tasks assessed/performed             Past Medical History:  Diagnosis Date   Colon polyp    Diabetes mellitus without complication ([place])    Diabetic neuropathy ([place])    HTN (hypertension)    Patella fracture    Renal calculi     Past Surgical History:  Procedure Laterality Date   LUMBAR LAMINECTOMY/DECOMPRESSION MICRODISCECTOMY N/A 07/01/2019   Procedure: LUMBAR LAMINECTOMY/DECOMPRESSION MICRODISCECTOMY 1 LEVEL, THORACIC SIX-SEVEN;  Surgeon: Consuella Lose, MD;  Location: [facility];  Service: Neurosurgery;  Laterality: N/A;  LUMBAR LAMINECTOMY/DECOMPRESSION MICRODISCECTOMY 1 LEVEL, THORACIC SIX-SEVEN    ORIF PATELLA     THORACIC DISCECTOMY N/A 07/03/2019   Procedure: Evacuation of Thoracic Hematoma;  Surgeon: Consuella Lose, MD;  Location: [facility];  Service: Neurosurgery;  Laterality: N/A;    There were no vitals filed for this visit.   Subjective Assessment - 12/28/20 1104     Subjective No changes since he was last here. Will be going to [facility] November 21st. Will be looking at getting botox then  since the [place] won't do it.    Pertinent History T8 paraplegia, diabetes, HTN    How long can you stand comfortably? probably a couple minutes with RW.    How long can you walk comfortably? approx. 5-10 minutes with his son and RW.    Patient Stated Goals wants to be able to walk without any assistance    Currently in Pain? No/denies                               [facility] Adult PT Treatment/Exercise - 12/28/20 1126       Transfers   Transfers Sit to Stand;Stand to Sit    Sit to Stand 5: Supervision;4: Min guard    Stand to Sit 5: Supervision;4: Min guard    Comments Performed from lower height mat table      Ambulation/Gait   Ambulation/Gait Yes    Ambulation/Gait Assistance 4: Min guard    Ambulation/Gait Assistance Details Cues for posture, hip/knee extension during stance phase and wider BOS to decr scissoring/decr pace when going around curves. Pt less fatigued at end of gait today, only needing min guard    Ambulation Distance (Feet) 230 Feet   x1   Assistive device Rolling walker    Gait Pattern Decreased weight shift to right;Left flexed knee in stance;Right flexed knee in stance;Narrow base of  support;Decreased step length - right;Decreased step length - left;Decreased stance time - right;Decreased hip/knee flexion - right;Step-through pattern;Poor foot clearance - right   incr tone in RLE with decr knee flexion during swing   Ambulation Surface Level;Indoor      Neuro Re-ed    Neuro Re-ed Details  Standing in // bars: 2 x 5 reps bilat side step out and back to midline - not able to keep entire R foot on the ground. Standing with BUE fingertip support - x10 reps head turns, x10 reps head nods - with verbal/manual cues for R>L knee extension, posture and glute activation, x5 reps alternating marching between legs with therapist in front of pt providing min guard and tactile cues for knee extension.      Exercises   Exercises Other Exercises    Other Exercises   Supine on mat table: x5 reps bridging, x5 reps with hamstring bias with legs farther extended, x10 reps bridging over red physioball, x10 reps hamstring curls bilat with assist for knee flexion and keeping the ball steady, incr difficulty with RLE.                       PT Short Term Goals - 12/21/20 1420       PT SHORT TERM GOAL #1   Title Pt will undergo further assessment of 2MWT vs. 3MWT with LTG written as appropriate in order to demo improved gait efficiency. ALL STGS DUE 01/18/21    Time 4    Period Weeks    Status New    Target Date 01/18/21      PT SHORT TERM GOAL #2   Title Pt will stand at [facility] with min guard with no UE support for at least 1 minute 15 seconds in order to demo improved balance for ADLs.    Baseline 1 minute 4 seconds on 11/15/20    Time 4    Period Weeks    Status Revised      PT SHORT TERM GOAL #3   Title Pt will ambulate 38'  with RW and min guard throughout in order to demo improved household mobility.    Baseline 345' with RW, min guard/min A during last lap    Time 4    Period Weeks    Status Revised      PT SHORT TERM GOAL #4   Title In aquatic therapy, pt will be able to descend down pool steps with min guard and ascend out of pool steps with mod A with therapist in order to demo improved functional mobility.    Baseline Min assist to descend into pool and mod to max assist to ascend out of pool at end of session.    Time 4    Period Weeks    Status New               PT Long Term Goals - 12/21/20 1421       PT LONG TERM GOAL #1   Title Pt and spouse will be independent with final HEP for BLE stretching and strengthening. ALL LTGS DUE 02/15/21    Baseline pt will continue to benefit from ongoing additions    Time 8    Period Weeks    Status On-going    Target Date 02/15/21      PT LONG TERM GOAL #2   Title Pt will improve gait speed with RW and R AFO to at least 1.28 ft/sec in order  to demo improved household mobility.     Baseline 29.92 = 1.09 ft/sec on 09/12/20; 37.31 seconds = .87 ft/sec on 11/15/20 (pt more tired from aquatic therapy yesterday)    Time 8    Period Weeks    Status On-going      PT LONG TERM GOAL #3   Title 2MWT vs. 3MWT goal to be written in order to demo improved gait efficiency.    Time 8    Period Weeks    Status New      PT LONG TERM GOAL #4   Title Pt will stand at [facility] with min guard with no UE support for at least 1 minute and 30 seconds in order to demo improved balance    Baseline 32.84 seconds on 09/12/20; 1 minute 4 seconds on 11/15/20    Time 8    Status On-going      PT LONG TERM GOAL #5   Title Pt will ambulate at least 230' over level surfaces with RW and supervision/min guard in order to demo improved household mobility and improved endurance    Baseline 345' distance today with min guard/min A    Time 8    Period Weeks    Status Revised                   Plan - 12/28/20 1148     Clinical Impression Statement Pt with improved gait mechanics today and decr fatigue with ambulating 230' with RW, only needed min guard throughout and last session pt needing min A for safety/balance towards end of 2nd lap due to fatigue with pt more forwardly flexed. Remainder of session focused on supine strengthening/ROM and standing balance strategies in the // bars. Pt fatigued easily with activities in // bars. Pt to potentially receive Botox in [place] later this month or will have to receive a referral from the [place] to have it done at [facility]. Will have to further look into this and discuss with Dr. Dagoberto Ligas.    Personal Factors and Comorbidities Comorbidity 3+;Past/Current Experience    Comorbidities T8 paraplegia, diabetes, HTN    Examination-Activity Limitations Locomotion Level;Transfers;Stand    Examination-Participation Restrictions Community Activity;Yard Work   Community education officer Evolving/Moderate complexity    Rehab Potential Good    PT  Frequency 2x / week   1-2x   PT Duration 12 weeks   15 visits over 12 weeks   PT Treatment/Interventions Aquatic Therapy;Manual techniques;Therapeutic exercise;Gait training;Neuromuscular re-education;Orthotic Fit/Training    PT Next Visit Plan continue to work on gait distance, step ups in parallel bars, side stepping. hip abductor strengthening, standing balance with decreased UE support, quadruped/tall kneeling for hip/core strengthening.    PT Home Exercise Plan Access Code: [email or web address]    Consulted and Agree with Plan of Care Patient             Patient will benefit from skilled therapeutic intervention in order to improve the following deficits and impairments:  Decreased balance, Decreased coordination, Decreased range of motion, Decreased strength, Decreased mobility, Postural dysfunction, Impaired tone, Decreased activity tolerance, Abnormal gait, Decreased endurance, Difficulty walking, Impaired sensation  Visit Diagnosis: Muscle weakness (generalized)  Difficulty in walking, not elsewhere classified  Unsteadiness on feet  Other symptoms and signs involving the nervous system     Problem List Patient Active Problem List   Diagnosis Date Noted   Erectile dysfunction due to diseases classified elsewhere 03/02/2020   Wheelchair dependence 11/27/2019   Spasticity 08/03/2019  Paraplegia ([place]) 07/07/2019   Neurogenic bowel 07/07/2019   Neurogenic bladder 07/07/2019   Thoracic myelopathy 06/30/2019    Arliss Journey, PT, DPT  12/28/2020, 11:50 AM  [facility] [address] [place], [name], [address] Phone: [phone]   Fax:  [phone]  Name: Timothy Davenport MRN: [id number] Date of Birth: 1950-10-04

## 2021-01-02 ENCOUNTER — Ambulatory Visit: Payer: No Typology Code available for payment source | Admitting: Physical Therapy

## 2021-01-02 ENCOUNTER — Other Ambulatory Visit: Payer: Self-pay

## 2021-01-02 DIAGNOSIS — R29818 Other symptoms and signs involving the nervous system: Secondary | ICD-10-CM

## 2021-01-02 DIAGNOSIS — R262 Difficulty in walking, not elsewhere classified: Secondary | ICD-10-CM

## 2021-01-02 DIAGNOSIS — M6281 Muscle weakness (generalized): Secondary | ICD-10-CM | POA: Diagnosis not present

## 2021-01-02 DIAGNOSIS — R2681 Unsteadiness on feet: Secondary | ICD-10-CM

## 2021-01-03 ENCOUNTER — Encounter: Payer: Self-pay | Admitting: Physical Therapy

## 2021-01-03 NOTE — Therapy (Signed)
[facility] [address] [place], [name], [address] Phone: [phone]   Fax:  [phone]  Physical Therapy Treatment  Patient Details  Name: Timothy Davenport MRN: [id number] Date of Birth: 1950/10/24 Referring Provider (PT): Juel Burrow    Encounter Date: 01/02/2021   PT End of Session - 01/03/21 1425     Visit Number 64    Number of Visits 78    Date for PT Re-Evaluation 02/10/21    Authorization Type VA - 15 additional visits through 12/28/20, additional auth for 15 visits.    Authorization - Visit Number 2    Authorization - Number of Visits 15    Progress Note Due on Visit 53    PT Start Time 1402    PT Stop Time 1445    PT Time Calculation (min) 43 min    Equipment Utilized During Treatment Gait belt    Activity Tolerance Patient tolerated treatment well    Behavior During Therapy WFL for tasks assessed/performed             Past Medical History:  Diagnosis Date   Colon polyp    Diabetes mellitus without complication ([place])    Diabetic neuropathy ([place])    HTN (hypertension)    Patella fracture    Renal calculi     Past Surgical History:  Procedure Laterality Date   LUMBAR LAMINECTOMY/DECOMPRESSION MICRODISCECTOMY N/A 07/01/2019   Procedure: LUMBAR LAMINECTOMY/DECOMPRESSION MICRODISCECTOMY 1 LEVEL, THORACIC SIX-SEVEN;  Surgeon: Consuella Lose, MD;  Location: [facility];  Service: Neurosurgery;  Laterality: N/A;  LUMBAR LAMINECTOMY/DECOMPRESSION MICRODISCECTOMY 1 LEVEL, THORACIC SIX-SEVEN    ORIF PATELLA     THORACIC DISCECTOMY N/A 07/03/2019   Procedure: Evacuation of Thoracic Hematoma;  Surgeon: Consuella Lose, MD;  Location: [facility];  Service: Neurosurgery;  Laterality: N/A;    There were no vitals filed for this visit.   Subjective Assessment - 01/03/21 1424     Subjective No new complaints. No falls or pain to report. Returns to the aquatic setting after a brief waiting time to allow [facility]  authorization.    Pertinent History T8 paraplegia, diabetes, HTN    How long can you stand comfortably? probably a couple minutes with RW.    How long can you walk comfortably? approx. 5-10 minutes with his son and RW.    Patient Stated Goals wants to be able to walk without any assistance    Currently in Pain? No/denies    Pain Score 0-No pain            Aquatic therapy at [facility] - pool temp 96 degrees   Patient seen for aquatic therapy today.  Treatment took place in water 3.5-4.5 feet deep depending upon activity.  Pt entered and exited the pool via steps with bil rails. Assistance needed to advance LE to next step with exiting pool with both LE's, left>right.  Stretching/strengtheing At step- seated passive hamstring/heel cored stretching bil sides for 3 reps; sit<>tnads no UE support 2 sets of 10 reps  gait With large bar bell- fwd, bwdk actoss/back for 4 laps, side to side for 3 laps each way  strengthening ' with step- fwd step ups, lateral step ups 2.5# weights- alternating lateral stepping out/in, alternaring backward kicks, altenating marching with UE support on wall     Pt requires buoyancy of water for support for reduced fall risk with gait training and balance exercises with minimal UE support; exercises able to be performed safely in water without  the risk of fall compared to those same exercises performed on land;  viscosity of water needed for resistance for strengthening.  Current of water provides perturbations for challenging static & dynamic standing balance.                                 PT Short Term Goals - 12/21/20 1420       PT SHORT TERM GOAL #1   Title Pt will undergo further assessment of 2MWT vs. 3MWT with LTG written as appropriate in order to demo improved gait efficiency. ALL STGS DUE 01/18/21    Time 4    Period Weeks    Status New    Target Date 01/18/21      PT SHORT TERM GOAL #2   Title Pt will stand at [facility]  with min guard with no UE support for at least 1 minute 15 seconds in order to demo improved balance for ADLs.    Baseline 1 minute 4 seconds on 11/15/20    Time 4    Period Weeks    Status Revised      PT SHORT TERM GOAL #3   Title Pt will ambulate 23'  with RW and min guard throughout in order to demo improved household mobility.    Baseline 345' with RW, min guard/min A during last lap    Time 4    Period Weeks    Status Revised      PT SHORT TERM GOAL #4   Title In aquatic therapy, pt will be able to descend down pool steps with min guard and ascend out of pool steps with mod A with therapist in order to demo improved functional mobility.    Baseline Min assist to descend into pool and mod to max assist to ascend out of pool at end of session.    Time 4    Period Weeks    Status New               PT Long Term Goals - 12/21/20 1421       PT LONG TERM GOAL #1   Title Pt and spouse will be independent with final HEP for BLE stretching and strengthening. ALL LTGS DUE 02/15/21    Baseline pt will continue to benefit from ongoing additions    Time 8    Period Weeks    Status On-going    Target Date 02/15/21      PT LONG TERM GOAL #2   Title Pt will improve gait speed with RW and R AFO to at least 1.28 ft/sec in order to demo improved household mobility.    Baseline 29.92 = 1.09 ft/sec on 09/12/20; 37.31 seconds = .87 ft/sec on 11/15/20 (pt more tired from aquatic therapy yesterday)    Time 8    Period Weeks    Status On-going      PT LONG TERM GOAL #3   Title 2MWT vs. 3MWT goal to be written in order to demo improved gait efficiency.    Time 8    Period Weeks    Status New      PT LONG TERM GOAL #4   Title Pt will stand at [facility] with min guard with no UE support for at least 1 minute and 30 seconds in order to demo improved balance    Baseline 32.84 seconds on 09/12/20; 1 minute 4 seconds on 11/15/20  Time 8    Status On-going      PT LONG TERM GOAL #5   Title Pt  will ambulate at least 230' over level surfaces with RW and supervision/min guard in order to demo improved household mobility and improved endurance    Baseline 345' distance today with min guard/min A    Time 8    Period Weeks    Status Revised                   Plan - 01/03/21 1425     Clinical Impression Statement Today's skilled session continued to focus on strengthening, gait and balance in the aquatic setting. Pt continues to need assistance to lift LE/clear steps when exiting pool due to LE fatigue. No other issues noted or reported in session. The pt is progressing toward goals and should benefit from continued PT to progress toward unmet goals.    Personal Factors and Comorbidities Comorbidity 3+;Past/Current Experience    Comorbidities T8 paraplegia, diabetes, HTN    Examination-Activity Limitations Locomotion Level;Transfers;Stand    Examination-Participation Restrictions Community Activity;Yard Work   Community education officer Evolving/Moderate complexity    Rehab Potential Good    PT Frequency 2x / week   1-2x   PT Duration 12 weeks   15 visits over 12 weeks   PT Treatment/Interventions Aquatic Therapy;Manual techniques;Therapeutic exercise;Gait training;Neuromuscular re-education;Orthotic Fit/Training    PT Next Visit Plan continue to work on gait distance, step ups in parallel bars, side stepping. hip abductor strengthening, standing balance with decreased UE support, quadruped/tall kneeling for hip/core strengthening.    PT Home Exercise Plan Access Code: [email or web address]    Consulted and Agree with Plan of Care Patient             Patient will benefit from skilled therapeutic intervention in order to improve the following deficits and impairments:  Decreased balance, Decreased coordination, Decreased range of motion, Decreased strength, Decreased mobility, Postural dysfunction, Impaired tone, Decreased activity tolerance, Abnormal gait, Decreased  endurance, Difficulty walking, Impaired sensation  Visit Diagnosis: No diagnosis found.     Problem List Patient Active Problem List   Diagnosis Date Noted   Erectile dysfunction due to diseases classified elsewhere 03/02/2020   Wheelchair dependence 11/27/2019   Spasticity 08/03/2019   Paraplegia ([place]) 07/07/2019   Neurogenic bowel 07/07/2019   Neurogenic bladder 07/07/2019   Thoracic myelopathy 06/30/2019    Willow Ora, PTA 01/03/2021, 2:33 PM  [facility] [address] [place], [name], [address] Phone: [phone]   Fax:  [phone]  Name: Timothy Davenport MRN: [id number] Date of Birth: [birth year]

## 2021-01-04 ENCOUNTER — Ambulatory Visit: Payer: No Typology Code available for payment source | Admitting: Physical Therapy

## 2021-01-04 ENCOUNTER — Other Ambulatory Visit: Payer: Self-pay

## 2021-01-04 ENCOUNTER — Encounter: Payer: Self-pay | Admitting: Physical Therapy

## 2021-01-04 DIAGNOSIS — R2681 Unsteadiness on feet: Secondary | ICD-10-CM

## 2021-01-04 DIAGNOSIS — M6281 Muscle weakness (generalized): Secondary | ICD-10-CM

## 2021-01-04 DIAGNOSIS — R293 Abnormal posture: Secondary | ICD-10-CM

## 2021-01-04 DIAGNOSIS — R29818 Other symptoms and signs involving the nervous system: Secondary | ICD-10-CM

## 2021-01-04 DIAGNOSIS — R262 Difficulty in walking, not elsewhere classified: Secondary | ICD-10-CM

## 2021-01-05 NOTE — Therapy (Signed)
Timothy Davenport [address] [place], [name], [address] Phone: [phone]   Fax:  [phone]  Physical Therapy Treatment  Patient Details  Name: Timothy Davenport MRN: [id number] Date of Birth: 10-13-1950 Referring Provider (PT): Timothy Davenport    Encounter Date: 01/04/2021   PT End of Session - 01/04/21 1104     Visit Number 65    Number of Visits 65    Date for PT Re-Evaluation 02/10/21    Authorization Type VA - 15 additional visits through 12/28/20, additional auth for 15 visits.    Authorization - Visit Number 3    Authorization - Number of Visits 15    Progress Note Due on Visit 40    PT Start Time 1100    PT Stop Time 1143    PT Time Calculation (min) 43 min    Equipment Utilized During Treatment Gait belt    Activity Tolerance Patient tolerated treatment well    Behavior During Therapy WFL for tasks assessed/performed             Past Medical History:  Diagnosis Date   Colon polyp    Diabetes mellitus without complication ([place])    Diabetic neuropathy ([place])    HTN (hypertension)    Patella fracture    Renal calculi     Past Surgical History:  Procedure Laterality Date   LUMBAR LAMINECTOMY/DECOMPRESSION MICRODISCECTOMY N/A 07/01/2019   Procedure: LUMBAR LAMINECTOMY/DECOMPRESSION MICRODISCECTOMY 1 LEVEL, THORACIC SIX-SEVEN;  Surgeon: Timothy Lose, MD;  Location: [facility];  Service: Neurosurgery;  Laterality: N/A;  LUMBAR LAMINECTOMY/DECOMPRESSION MICRODISCECTOMY 1 LEVEL, THORACIC SIX-SEVEN    ORIF PATELLA     THORACIC DISCECTOMY N/A 07/03/2019   Procedure: Evacuation of Thoracic Hematoma;  Surgeon: Timothy Lose, MD;  Location: [facility];  Service: Neurosurgery;  Laterality: N/A;    There were no vitals filed for this visit.   Subjective Assessment - 01/04/21 1103     Subjective No new complaints. No falls or pain to report. Felt okay after pool session on Monday.    Pertinent History T8  paraplegia, diabetes, HTN    How long can you stand comfortably? probably a couple minutes with RW.    How long can you walk comfortably? approx. 5-10 minutes with his son and RW.    Patient Stated Goals wants to be able to walk without any assistance    Currently in Pain? No/denies    Pain Score 0-No pain                       [facility] Adult PT Treatment/Exercise - 01/04/21 1105       Transfers   Transfers Sit to Stand;Stand to Sit    Sit to Stand 5: Supervision;4: Min guard    Stand to Sit 5: Supervision;4: Min guard      Ambulation/Gait   Ambulation/Gait Yes    Ambulation/Gait Assistance 4: Min guard;4: Min assist    Ambulation/Gait Assistance Details pt min guard assist for first ~130 feet, increased assitance needed as gait distance progressed with up to mod assist for last 10 feet before chair brought to pt due to fatigue/increased hip and knee flexion in stance.    Ambulation Distance (Feet) 200 Feet   x1   Assistive device Rolling walker    Gait Pattern Decreased weight shift to right;Left flexed knee in stance;Right flexed knee in stance;Narrow base of support;Decreased step length - right;Decreased step length - left;Decreased stance time - right;Decreased  hip/knee flexion - right;Step-through pattern;Poor foot clearance - right    Ambulation Surface Level;Indoor      Exercises   Exercises Other Exercises    Other Exercises  supine on mat table: with red ball under legs for bridging 2 sets of 10 reps, then with ball at feet hamsting curls for 2 sets of 10 reps. assist needed to maintain position on ball along with cues on form/technique. then with pt at edge of mat- lowering foot to floor/back onto mat while in hip/knee flexion for 2 sets of 10 reps on each leg; then seated at edge of mat: long arc quads, then foot slides with pillow case underneath shoe for 2 sets 10 reps each/bil LE's. assist needed with right long arc quads.                    PT Short  Term Goals - 12/21/20 1420       PT SHORT TERM GOAL #1   Title Pt will undergo further assessment of 2MWT vs. 3MWT with LTG written as appropriate in order to demo improved gait efficiency. ALL STGS DUE 01/18/21    Time 4    Period Weeks    Status New    Target Date 01/18/21      PT SHORT TERM GOAL #2   Title Pt will stand at [facility] with min guard with no UE support for at least 1 minute 15 seconds in order to demo improved balance for ADLs.    Baseline 1 minute 4 seconds on 11/15/20    Time 4    Period Weeks    Status Revised      PT SHORT TERM GOAL #3   Title Pt will ambulate 39'  with RW and min guard throughout in order to demo improved household mobility.    Baseline 345' with RW, min guard/min A during last lap    Time 4    Period Weeks    Status Revised      PT SHORT TERM GOAL #4   Title In aquatic therapy, pt will be able to descend down pool steps with min guard and ascend out of pool steps with mod A with therapist in order to demo improved functional mobility.    Baseline Min assist to descend into pool and mod to max assist to ascend out of pool at end of session.    Time 4    Period Weeks    Status New               PT Long Term Goals - 12/21/20 1421       PT LONG TERM GOAL #1   Title Pt and spouse will be independent with final HEP for BLE stretching and strengthening. ALL LTGS DUE 02/15/21    Baseline pt will continue to benefit from ongoing additions    Time 8    Period Weeks    Status On-going    Target Date 02/15/21      PT LONG TERM GOAL #2   Title Pt will improve gait speed with RW and R AFO to at least 1.28 ft/sec in order to demo improved household mobility.    Baseline 29.92 = 1.09 ft/sec on 09/12/20; 37.31 seconds = .87 ft/sec on 11/15/20 (pt more tired from aquatic therapy yesterday)    Time 8    Period Weeks    Status On-going      PT LONG TERM GOAL #3   Title 2MWT  vs. 3MWT goal to be written in order to demo improved gait efficiency.     Time 8    Period Weeks    Status New      PT LONG TERM GOAL #4   Title Pt will stand at [facility] with min guard with no UE support for at least 1 minute and 30 seconds in order to demo improved balance    Baseline 32.84 seconds on 09/12/20; 1 minute 4 seconds on 11/15/20    Time 8    Status On-going      PT LONG TERM GOAL #5   Title Pt will ambulate at least 230' over level surfaces with RW and supervision/min guard in order to demo improved household mobility and improved endurance    Baseline 345' distance today with min guard/min A    Time 8    Period Weeks    Status Revised                   Plan - 01/04/21 1105     Clinical Impression Statement Today's skilled session continued to focus on gait with RW and strengthening. Pt fatigued with 2cd lap of gait this session needing wheelchair brought to him for safety. No further issues noted or reported in session. The pt is making progress toward goals and should benefit from continued PT to progress toward unmet goals.    Personal Factors and Comorbidities Comorbidity 3+;Past/Current Experience    Comorbidities T8 paraplegia, diabetes, HTN    Examination-Activity Limitations Locomotion Level;Transfers;Stand    Examination-Participation Restrictions Community Activity;Yard Work   Community education officer Evolving/Moderate complexity    Rehab Potential Good    PT Frequency 2x / week   1-2x   PT Duration 12 weeks   15 visits over 12 weeks   PT Treatment/Interventions Aquatic Therapy;Manual techniques;Therapeutic exercise;Gait training;Neuromuscular re-education;Orthotic Fit/Training    PT Next Visit Plan continue to work on gait distance, step ups in parallel bars, side stepping. hip abductor strengthening, standing balance with decreased UE support, quadruped/tall kneeling for hip/core strengthening.    PT Home Exercise Plan Access Code: [email or web address]    Consulted and Agree with Plan of Care Patient              Patient will benefit from skilled therapeutic intervention in order to improve the following deficits and impairments:  Decreased balance, Decreased coordination, Decreased range of motion, Decreased strength, Decreased mobility, Postural dysfunction, Impaired tone, Decreased activity tolerance, Abnormal gait, Decreased endurance, Difficulty walking, Impaired sensation  Visit Diagnosis: Muscle weakness (generalized)  Difficulty in walking, not elsewhere classified  Unsteadiness on feet  Other symptoms and signs involving the nervous system  Abnormal posture     Problem List Patient Active Problem List   Diagnosis Date Noted   Erectile dysfunction due to diseases classified elsewhere 03/02/2020   Wheelchair dependence 11/27/2019   Spasticity 08/03/2019   Paraplegia ([place]) 07/07/2019   Neurogenic bowel 07/07/2019   Neurogenic bladder 07/07/2019   Thoracic myelopathy 06/30/2019    Willow Ora, PTA, [facility] [address] [place], [place] [address] [phone] 01/05/21, 10:39 AM   Name: Timothy Davenport MRN: [id number] Date of Birth: Sep 30, 1950

## 2021-01-09 ENCOUNTER — Ambulatory Visit: Payer: No Typology Code available for payment source | Admitting: Physical Therapy

## 2021-01-11 ENCOUNTER — Other Ambulatory Visit: Payer: Self-pay

## 2021-01-11 ENCOUNTER — Ambulatory Visit: Payer: No Typology Code available for payment source | Admitting: Physical Therapy

## 2021-01-11 ENCOUNTER — Encounter: Payer: Self-pay | Admitting: Physical Therapy

## 2021-01-11 DIAGNOSIS — R262 Difficulty in walking, not elsewhere classified: Secondary | ICD-10-CM

## 2021-01-11 DIAGNOSIS — M6281 Muscle weakness (generalized): Secondary | ICD-10-CM | POA: Diagnosis not present

## 2021-01-11 DIAGNOSIS — R2681 Unsteadiness on feet: Secondary | ICD-10-CM

## 2021-01-11 NOTE — Therapy (Signed)
[facility] [address] [place], [name], [address] Phone: [phone]   Fax:  [phone]  Physical Therapy Treatment  Patient Details  Name: Timothy Davenport MRN: [id number] Date of Birth: 03/29/1950 Referring Provider (PT): Juel Burrow    Encounter Date: 01/11/2021   PT End of Session - 01/11/21 1105     Visit Number 66    Number of Visits 81    Date for PT Re-Evaluation 02/10/21    Authorization Type VA - 15 additional visits through 12/28/20, additional auth for 15 visits.    Authorization - Visit Number 4    Authorization - Number of Visits 15    Progress Note Due on Visit 36    PT Start Time 1102    PT Stop Time 1141    PT Time Calculation (min) 39 min    Equipment Utilized During Treatment Gait belt    Activity Tolerance Patient tolerated treatment well    Behavior During Therapy WFL for tasks assessed/performed             Past Medical History:  Diagnosis Date   Colon polyp    Diabetes mellitus without complication ([place])    Diabetic neuropathy ([place])    HTN (hypertension)    Patella fracture    Renal calculi     Past Surgical History:  Procedure Laterality Date   LUMBAR LAMINECTOMY/DECOMPRESSION MICRODISCECTOMY N/A 07/01/2019   Procedure: LUMBAR LAMINECTOMY/DECOMPRESSION MICRODISCECTOMY 1 LEVEL, THORACIC SIX-SEVEN;  Surgeon: Consuella Lose, MD;  Location: [facility];  Service: Neurosurgery;  Laterality: N/A;  LUMBAR LAMINECTOMY/DECOMPRESSION MICRODISCECTOMY 1 LEVEL, THORACIC SIX-SEVEN    ORIF PATELLA     THORACIC DISCECTOMY N/A 07/03/2019   Procedure: Evacuation of Thoracic Hematoma;  Surgeon: Consuella Lose, MD;  Location: [facility];  Service: Neurosurgery;  Laterality: N/A;    There were no vitals filed for this visit.   Subjective Assessment - 01/11/21 1105     Subjective Went to [place] earlier this week. Reports everything looks good, vitamin D was low. Will have to reach out to the  [place] to get a referral for botox.    Pertinent History T8 paraplegia, diabetes, HTN    How long can you stand comfortably? probably a couple minutes with RW.    How long can you walk comfortably? approx. 5-10 minutes with his son and RW.    Patient Stated Goals wants to be able to walk without any assistance    Currently in Pain? No/denies                               [facility] Adult PT Treatment/Exercise - 01/11/21 1106       Transfers   Transfers Sit to Stand;Stand to Sit    Sit to Stand 5: Supervision;4: Min guard    Stand to Sit 5: Supervision;4: Min guard      Ambulation/Gait   Ambulation/Gait Yes    Ambulation/Gait Assistance 4: Min guard;4: Min assist    Ambulation/Gait Assistance Details Pt with min guard with cues for posture, wider BOS, and hip/knee extension during 2nd lap. For last 10' back to mat table pt needing min A due to pt with difficulty stepping and weight shifting on RLE when turning with RW    Ambulation Distance (Feet) 230 Feet    Assistive device Rolling walker    Gait Pattern Decreased weight shift to right;Left flexed knee in stance;Right flexed knee in stance;Narrow  base of support;Decreased step length - right;Decreased step length - left;Decreased stance time - right;Decreased hip/knee flexion - right;Step-through pattern;Poor foot clearance - right    Ambulation Surface Level;Indoor      Neuro Re-ed    Neuro Re-ed Details  Standing in // bars: alternating UE lifts x5 reps each side, trunk rotations x10 reps each side (tapping target that PT tech is holding in different directions). Cues throughout for posture and hip extension.      Exercises   Exercises Other Exercises    Other Exercises  With BUE support on bars: placing RLE on 4" step and keeping it on x5 reps step ups with LLE requiring min A for balance and PT tech to help get L foot off step during last couple reps. Keeping RLE on ground and trying to tap LLE to 4" step x4 reps, with  min A by therapist and PT tech helping to remove LLE with min A during last couple reps. x7 reps LLE step taps to 2" step with cues for posture and incr hip/knee flexion to clear foot. Incr forward flexed posture and R knee flexion when fatigued.      Knee/Hip Exercises: Machines for Strengthening   Total Gym Leg Press Pt able to transfer with supervision using lateral scoot transfer from w/c. Performed 2 x 10 reps at 55# with BLE, cues for eccentric control and knee extension when pushing out. Pt lacking full knee extension with RLE                       PT Short Term Goals - 12/21/20 1420       PT SHORT TERM GOAL #1   Title Pt will undergo further assessment of 2MWT vs. 3MWT with LTG written as appropriate in order to demo improved gait efficiency. ALL STGS DUE 01/18/21    Time 4    Period Weeks    Status New    Target Date 01/18/21      PT SHORT TERM GOAL #2   Title Pt will stand at [facility] with min guard with no UE support for at least 1 minute 15 seconds in order to demo improved balance for ADLs.    Baseline 1 minute 4 seconds on 11/15/20    Time 4    Period Weeks    Status Revised      PT SHORT TERM GOAL #3   Title Pt will ambulate 73'  with RW and min guard throughout in order to demo improved household mobility.    Baseline 345' with RW, min guard/min A during last lap    Time 4    Period Weeks    Status Revised      PT SHORT TERM GOAL #4   Title In aquatic therapy, pt will be able to descend down pool steps with min guard and ascend out of pool steps with mod A with therapist in order to demo improved functional mobility.    Baseline Min assist to descend into pool and mod to max assist to ascend out of pool at end of session.    Time 4    Period Weeks    Status New               PT Long Term Goals - 12/21/20 1421       PT LONG TERM GOAL #1   Title Pt and spouse will be independent with final HEP for BLE stretching and strengthening. ALL LTGS  DUE  02/15/21    Baseline pt will continue to benefit from ongoing additions    Time 8    Period Weeks    Status On-going    Target Date 02/15/21      PT LONG TERM GOAL #2   Title Pt will improve gait speed with RW and R AFO to at least 1.28 ft/sec in order to demo improved household mobility.    Baseline 29.92 = 1.09 ft/sec on 09/12/20; 37.31 seconds = .87 ft/sec on 11/15/20 (pt more tired from aquatic therapy yesterday)    Time 8    Period Weeks    Status On-going      PT LONG TERM GOAL #3   Title 2MWT vs. 3MWT goal to be written in order to demo improved gait efficiency.    Time 8    Period Weeks    Status New      PT LONG TERM GOAL #4   Title Pt will stand at [facility] with min guard with no UE support for at least 1 minute and 30 seconds in order to demo improved balance    Baseline 32.84 seconds on 09/12/20; 1 minute 4 seconds on 11/15/20    Time 8    Status On-going      PT LONG TERM GOAL #5   Title Pt will ambulate at least 230' over level surfaces with RW and supervision/min guard in order to demo improved household mobility and improved endurance    Baseline 345' distance today with min guard/min A    Time 8    Period Weeks    Status Revised                   Plan - 01/11/21 1158     Clinical Impression Statement Pt able to ambulate 230' today with RW with min guard and only min A during last 10' to help with turning to help sit down to the mat table due to decr step length/weight shift with RLE. With step up/step taps activities in the bars for SLS/strength/foot clearance, PT tech and PT providing min A to help with foot clearance on either side. Pt needing rest breaks  with activities in // bars due to fatigue. Will continue to progress towards LTGs.    Personal Factors and Comorbidities Comorbidity 3+;Past/Current Experience    Comorbidities T8 paraplegia, diabetes, HTN    Examination-Activity Limitations Locomotion Level;Transfers;Stand    Examination-Participation  Restrictions Community Activity;Yard Work   Community education officer Evolving/Moderate complexity    Rehab Potential Good    PT Frequency 2x / week   1-2x   PT Duration 12 weeks   15 visits over 12 weeks   PT Treatment/Interventions Aquatic Therapy;Manual techniques;Therapeutic exercise;Gait training;Neuromuscular re-education;Orthotic Fit/Training    PT Next Visit Plan STGs due next week. continue to work on gait distance, step ups in parallel bars, side stepping. hip abductor strengthening, standing balance with decreased UE support, quadruped/tall kneeling for hip/core strengthening.    PT Home Exercise Plan Access Code: [email or web address]    Consulted and Agree with Plan of Care Patient             Patient will benefit from skilled therapeutic intervention in order to improve the following deficits and impairments:  Decreased balance, Decreased coordination, Decreased range of motion, Decreased strength, Decreased mobility, Postural dysfunction, Impaired tone, Decreased activity tolerance, Abnormal gait, Decreased endurance, Difficulty walking, Impaired sensation  Visit Diagnosis: Muscle weakness (generalized)  Unsteadiness on feet  Difficulty in walking, not elsewhere classified     Problem List Patient Active Problem List   Diagnosis Date Noted   Erectile dysfunction due to diseases classified elsewhere 03/02/2020   Wheelchair dependence 11/27/2019   Spasticity 08/03/2019   Paraplegia ([place]) 07/07/2019   Neurogenic bowel 07/07/2019   Neurogenic bladder 07/07/2019   Thoracic myelopathy 06/30/2019    Arliss Journey, PT, DPT 01/11/2021, 12:00 PM  [facility] [address] [place], [name], [address] Phone: [phone]   Fax:  [phone]  Name: Timothy Davenport MRN: [id number] Date of Birth: [birth year]

## 2021-01-16 ENCOUNTER — Ambulatory Visit: Payer: No Typology Code available for payment source | Admitting: Physical Therapy

## 2021-01-18 ENCOUNTER — Encounter: Payer: Self-pay | Admitting: Physical Therapy

## 2021-01-18 ENCOUNTER — Ambulatory Visit: Payer: No Typology Code available for payment source | Admitting: Physical Therapy

## 2021-01-18 ENCOUNTER — Other Ambulatory Visit: Payer: Self-pay

## 2021-01-18 DIAGNOSIS — M6281 Muscle weakness (generalized): Secondary | ICD-10-CM | POA: Diagnosis not present

## 2021-01-18 DIAGNOSIS — R262 Difficulty in walking, not elsewhere classified: Secondary | ICD-10-CM

## 2021-01-18 DIAGNOSIS — R2681 Unsteadiness on feet: Secondary | ICD-10-CM

## 2021-01-18 NOTE — Therapy (Signed)
[facility] [address] [place], [name], [address] Phone: [phone]   Fax:  [phone]  Physical Therapy Treatment  Patient Details  Name: Timothy Davenport MRN: [id number] Date of Birth: [birth year] Referring Provider (PT): Juel Burrow    Encounter Date: 01/18/2021   PT End of Session - 01/18/21 1300     Visit Number 6    Number of Visits 70    Date for PT Re-Evaluation 02/10/21    Authorization Type VA - 15 additional visits through 12/28/20, additional auth for 15 visits.    Authorization - Visit Number 5    Authorization - Number of Visits 15    Progress Note Due on Visit 23    PT Start Time 1100    PT Stop Time 1145    PT Time Calculation (min) 45 min    Equipment Utilized During Treatment Gait belt    Activity Tolerance Patient tolerated treatment well    Behavior During Therapy WFL for tasks assessed/performed             Past Medical History:  Diagnosis Date   Colon polyp    Diabetes mellitus without complication ([place])    Diabetic neuropathy ([place])    HTN (hypertension)    Patella fracture    Renal calculi     Past Surgical History:  Procedure Laterality Date   LUMBAR LAMINECTOMY/DECOMPRESSION MICRODISCECTOMY N/A 07/01/2019   Procedure: LUMBAR LAMINECTOMY/DECOMPRESSION MICRODISCECTOMY 1 LEVEL, THORACIC SIX-SEVEN;  Surgeon: Consuella Lose, MD;  Location: [facility];  Service: Neurosurgery;  Laterality: N/A;  LUMBAR LAMINECTOMY/DECOMPRESSION MICRODISCECTOMY 1 LEVEL, THORACIC SIX-SEVEN    ORIF PATELLA     THORACIC DISCECTOMY N/A 07/03/2019   Procedure: Evacuation of Thoracic Hematoma;  Surgeon: Consuella Lose, MD;  Location: [facility];  Service: Neurosurgery;  Laterality: N/A;    There were no vitals filed for this visit.   Subjective Assessment - 01/18/21 1105     Subjective No new complaints, falls, or pain. The pt reports that he's been walking short distances with his RW at home, and  that it's been going well.    Pertinent History T8 paraplegia, diabetes, HTN    How long can you stand comfortably? probably a couple minutes with RW.    How long can you walk comfortably? approx. 5-10 minutes with his son and RW.    Patient Stated Goals wants to be able to walk without any assistance    Currently in Pain? No/denies    Pain Score 0-No pain              [facility] Adult PT Treatment/Exercise - 01/18/21 1107       Transfers   Transfers Sit to Stand;Stand to Sit    Sit to Stand 5: Supervision;4: Min guard    Stand to Sit 5: Supervision;4: Min guard      Ambulation/Gait   Ambulation/Gait Yes    Ambulation/Gait Assistance 4: Min guard;4: Min assist    Ambulation/Gait Assistance Details pt min guard for ambulating almost 2 laps around track. Pt fatigued quickly at the end of the 2nd lap, and was unable to complete lap (was right next to mat with about 5-8 feet left). The pt required mod assist + 2 to remain standing and a WC be placed behind to sit.    Ambulation Distance (Feet) 220 Feet    Assistive device Rolling walker    Gait Pattern Decreased weight shift to right;Left flexed knee in stance;Right flexed knee in stance;Narrow  base of support;Decreased step length - right;Decreased step length - left;Decreased stance time - right;Decreased hip/knee flexion - right;Step-through pattern;Poor foot clearance - right    Ambulation Surface Level;Indoor      Neuro Re-ed    Neuro Re-ed Details  Pt standing at edge of mat with RW: pt attempted to stand without UE support for >1 min, however pt fatigued quickly. Pt able to remain standing for ~10-20 seconds x 4 with min guard with seated breaks in between.      Exercises   Exercises Other Exercises    Other Exercises  supine on mat table: Pt performed bridging with red theraball under LE's 2 x 10 reps.  The pt then performed supine hamstring curls with red theraball at feet x 10. The pt required min assist to place LE's on ball and  to maintain position throughout both exercises. Pt the posittioned supine at edge of mat and performed lowering foot onto floor/back from the floor maintaining hip/knee in flexion x 10 each side.      Knee/Hip Exercises: Sidelying   Hip ABduction AAROM;Strengthening;Both;10 reps;2 sets    Hip ABduction Limitations clamshells 10 x each side. The pt required assist to prevent trunk rotation and to complete available range.               PT Short Term Goals - 01/18/21 1250       PT SHORT TERM GOAL #1   Title Pt will undergo further assessment of 2MWT vs. 3MWT with LTG written as appropriate in order to demo improved gait efficiency. ALL STGS DUE 01/18/21    Baseline pt ambulated for 5 minutes and 30 seconds for 220 ft with min guard/assist with RW on 01/18/2021    Status Achieved    Target Date 01/18/21      PT SHORT TERM GOAL #2   Title Pt will stand at [facility] with min guard with no UE support for at least 1 minute 15 seconds in order to demo improved balance for ADLs.    Baseline best time of 20 seconds on 01/18/2021    Status Not Met      PT SHORT TERM GOAL #3   Title Pt will ambulate 46'  with RW and min guard throughout in order to demo improved household mobility.    Baseline 220' with RW, min guard/assist during last few feet. 01/18/2021    Status Partially Met      PT SHORT TERM GOAL #4   Title In aquatic therapy, pt will be able to descend down pool steps with min guard and ascend out of pool steps with mod A with therapist in order to demo improved functional mobility.    Baseline will assess after next aquatic session.    Status Deferred                    PT Long Term Goals - 12/21/20 1421       PT LONG TERM GOAL #1   Title Pt and spouse will be independent with final HEP for BLE stretching and strengthening. ALL LTGS DUE 02/15/21    Baseline pt will continue to benefit from ongoing additions    Time 8    Period Weeks    Status On-going    Target Date  02/15/21      PT LONG TERM GOAL #2   Title Pt will improve gait speed with RW and R AFO to at least 1.28 ft/sec in order to demo  improved household mobility.    Baseline 29.92 = 1.09 ft/sec on 09/12/20; 37.31 seconds = .87 ft/sec on 11/15/20 (pt more tired from aquatic therapy yesterday)    Time 8    Period Weeks    Status On-going      PT LONG TERM GOAL #3   Title 2MWT vs. 3MWT goal to be written in order to demo improved gait efficiency.    Time 8    Period Weeks    Status New      PT LONG TERM GOAL #4   Title Pt will stand at [facility] with min guard with no UE support for at least 1 minute and 30 seconds in order to demo improved balance    Baseline 32.84 seconds on 09/12/20; 1 minute 4 seconds on 11/15/20    Time 8    Status On-going      PT LONG TERM GOAL #5   Title Pt will ambulate at least 230' over level surfaces with RW and supervision/min guard in order to demo improved household mobility and improved endurance    Baseline 345' distance today with min guard/min A    Time 8    Period Weeks    Status Revised              Plan - 01/18/21 1301     Clinical Impression Statement Today's skilled session was focused on assessing STGs. The pt met 1 goal, partially met 1 goal, and did not met 1 goal. His last goal was deferred until next aquatic session. The rest of the session was focused on LE strengthening on the mat. The pt was limited by fatigue this session, but should continue to benefit from further skilled PT to addesss functional deficits.    Personal Factors and Comorbidities Comorbidity 3+;Past/Current Experience    Comorbidities T8 paraplegia, diabetes, HTN    Examination-Activity Limitations Locomotion Level;Transfers;Stand    Examination-Participation Restrictions Community Activity;Yard Work    [employer] Evolving/Moderate complexity    Rehab Potential Good    PT Frequency 2x / week    PT Duration 12 weeks    PT Treatment/Interventions  Aquatic Therapy;Manual techniques;Therapeutic exercise;Gait training;Neuromuscular re-education;Orthotic Fit/Training    PT Next Visit Plan continue to work on gait distance, step ups in parallel bars, side stepping. hip abductor strengthening, standing tolerance with decreased UE support, quadruped/tall kneeling for hip/core strengthening.    PT Home Exercise Plan Access Code: [email or web address]    Consulted and Agree with Plan of Care Patient             Patient will benefit from skilled therapeutic intervention in order to improve the following deficits and impairments:  Decreased balance, Decreased coordination, Decreased range of motion, Decreased strength, Decreased mobility, Postural dysfunction, Impaired tone, Decreased activity tolerance, Abnormal gait, Decreased endurance, Difficulty walking, Impaired sensation  Visit Diagnosis: Muscle weakness (generalized)  Unsteadiness on feet  Difficulty in walking, not elsewhere classified     Problem List Patient Active Problem List   Diagnosis Date Noted   Erectile dysfunction due to diseases classified elsewhere 03/02/2020   Wheelchair dependence 11/27/2019   Spasticity 08/03/2019   Paraplegia ([place]) 07/07/2019   Neurogenic bowel 07/07/2019   Neurogenic bladder 07/07/2019   Thoracic myelopathy 06/30/2019    Rondel Baton, SPTA 01/18/2021, 1:09 PM  [facility] [address] [place], [name], [address] Phone: [phone]   Fax:  [phone]  Name: DIXON LUCZAK MRN: [id number] Date of Birth: [birth year]  This note has been reviewed and edited by supervising CI.   Willow Ora, PTA, [facility] [address] [place], [place] [address] [phone] 01/18/21, 2:21 PM

## 2021-01-23 ENCOUNTER — Ambulatory Visit: Payer: No Typology Code available for payment source | Attending: Family Medicine | Admitting: Physical Therapy

## 2021-01-23 ENCOUNTER — Encounter: Payer: Self-pay | Admitting: Physical Therapy

## 2021-01-23 ENCOUNTER — Other Ambulatory Visit: Payer: Self-pay

## 2021-01-23 DIAGNOSIS — R262 Difficulty in walking, not elsewhere classified: Secondary | ICD-10-CM | POA: Diagnosis present

## 2021-01-23 DIAGNOSIS — R2681 Unsteadiness on feet: Secondary | ICD-10-CM | POA: Diagnosis present

## 2021-01-23 DIAGNOSIS — R293 Abnormal posture: Secondary | ICD-10-CM | POA: Insufficient documentation

## 2021-01-23 DIAGNOSIS — R29818 Other symptoms and signs involving the nervous system: Secondary | ICD-10-CM | POA: Insufficient documentation

## 2021-01-23 DIAGNOSIS — M6281 Muscle weakness (generalized): Secondary | ICD-10-CM | POA: Diagnosis not present

## 2021-01-23 NOTE — Therapy (Signed)
[facility] [address] [place], [name], [address] Phone: [phone]   Fax:  [phone]  Physical Therapy Treatment  Patient Details  Name: Timothy Davenport MRN: [id number] Date of Birth: 01-09-1951 Referring Provider (PT): Juel Burrow    Encounter Date: 01/23/2021   PT End of Session - 01/23/21 1404     Visit Number 72    Number of Visits 41    Date for PT Re-Evaluation 02/10/21    Authorization Type VA - 15 additional visits through 12/28/20, additional auth for 15 visits.    Authorization - Visit Number 6    Authorization - Number of Visits 15    Progress Note Due on Visit 33    PT Start Time 1401    PT Stop Time 1443    PT Time Calculation (min) 42 min    Equipment Utilized During Treatment Gait belt;Other (comment)   blue barbell and ankle weights   Activity Tolerance Patient tolerated treatment well    Behavior During Therapy WFL for tasks assessed/performed             Past Medical History:  Diagnosis Date   Colon polyp    Diabetes mellitus without complication ([place])    Diabetic neuropathy ([place])    HTN (hypertension)    Patella fracture    Renal calculi     Past Surgical History:  Procedure Laterality Date   LUMBAR LAMINECTOMY/DECOMPRESSION MICRODISCECTOMY N/A 07/01/2019   Procedure: LUMBAR LAMINECTOMY/DECOMPRESSION MICRODISCECTOMY 1 LEVEL, THORACIC SIX-SEVEN;  Surgeon: Consuella Lose, MD;  Location: [facility];  Service: Neurosurgery;  Laterality: N/A;  LUMBAR LAMINECTOMY/DECOMPRESSION MICRODISCECTOMY 1 LEVEL, THORACIC SIX-SEVEN    ORIF PATELLA     THORACIC DISCECTOMY N/A 07/03/2019   Procedure: Evacuation of Thoracic Hematoma;  Surgeon: Consuella Lose, MD;  Location: [facility];  Service: Neurosurgery;  Laterality: N/A;    There were no vitals filed for this visit.   Subjective Assessment - 01/23/21 1402     Subjective No new complaints, falls, or pain. The pt reports going up/down some  steps with handrails at his friend's house over the weekend.    Pertinent History T8 paraplegia, diabetes, HTN    How long can you stand comfortably? probably a couple minutes with RW.    How long can you walk comfortably? approx. 5-10 minutes with his son and RW.    Patient Stated Goals wants to be able to walk without any assistance    Currently in Pain? No/denies    Pain Score 0-No pain             Aquatic therapy at [facility] - pool temp 94 degrees  Pt seen for aquatic therapy today. Treatment took place in water 3.5-4.5 ft deep depending upon activity. Pt entered/exited pool via steps with B handrails and step-to pattern. Assistance was needed to advance B LE's to next step when exiting pool.  Seated at bench in corner:  Passive heel cord/gastroc/hamstring stretch x 60 sec each Sit<>stands with no UE support 2 x 10 with LE's in // stance. The pt required min assist to maintain LE's position and min facilitation at pelvis for erect posture.  In ~4-4.5 ft with blue barbell for support:  Forward walking x 4 down and back Backward walking x 4 down and back Side stepping x 4 each direction  The pt required min-mod assist for stabilization of long blue bar bell during all gait.  In 4 ft with B UE support on wall, 2.5  ankle weights on B LE's, and focusing on maintaining erect posture:  Alternating hip abduction x 15 each side Alternating hip extension x 15 each side Alternating standing marches x 15 each side  Standing hamstring curls with no weights x 15 each side. Pt required min assist/facilitation for proper technique and to prevent hip extension/and perform knee flexion.  In ~4.5 ft near wall for safety:  Standing with no UE support for balance with back to wall, PTA in front with up to min assist from PTA at times and pt bouncing off wall at times from balance loss.   Standing  in ~4.5 with LE's wide and moving UE's in water while focusing on erect posture: Bil  Shoulder horizontal abduction/adduction at water level x 10  Alternating punches x 10 each side Bil Shoulder abduction/adduction x 10   Pt required short rest breaks throughout session.   Pt requires buoyancy of water for support for reduced fall risk with gait training and balance exercises with minimal UE support; exercises able to be performed safely in water without the risk of fall compared to those same exercises performed on land; viscosity of water needed for resistance for strengthening. Current of water provides perturbations for challenging static/dynamic standing balance.     PT Short Term Goals - 01/18/21 1250       PT SHORT TERM GOAL #1   Title Pt will undergo further assessment of 2MWT vs. 3MWT with LTG written as appropriate in order to demo improved gait efficiency. ALL STGS DUE 01/18/21    Baseline pt ambulated for 5 minutes and 30 seconds for 220 ft with min guard/assist with RW on 01/18/2021    Status Achieved    Target Date 01/18/21      PT SHORT TERM GOAL #2   Title Pt will stand at [facility] with min guard with no UE support for at least 1 minute 15 seconds in order to demo improved balance for ADLs.    Baseline best time of 20 seconds on 01/18/2021    Status Not Met      PT SHORT TERM GOAL #3   Title Pt will ambulate 35'  with RW and min guard throughout in order to demo improved household mobility.    Baseline 220' with RW, min guard/assist during last few feet, then mod assist of 2 for balance while chair brought to pt due to fatigue. 01/18/2021    Status Partially Met      PT SHORT TERM GOAL #4   Title In aquatic therapy, pt will be able to descend down pool steps with min guard and ascend out of pool steps with mod A with therapist in order to demo improved functional mobility.    Baseline will assess after next aquatic session.    Status On-going               PT Long Term Goals - 12/21/20 1421       PT LONG TERM GOAL #1   Title Pt and spouse will  be independent with final HEP for BLE stretching and strengthening. ALL LTGS DUE 02/15/21    Baseline pt will continue to benefit from ongoing additions    Time 8    Period Weeks    Status On-going    Target Date 02/15/21      PT LONG TERM GOAL #2   Title Pt will improve gait speed with RW and R AFO to at least 1.28 ft/sec in order to demo improved  household mobility.    Baseline 29.92 = 1.09 ft/sec on 09/12/20; 37.31 seconds = .87 ft/sec on 11/15/20 (pt more tired from aquatic therapy yesterday)    Time 8    Period Weeks    Status On-going      PT LONG TERM GOAL #3   Title 2MWT vs. 3MWT goal to be written in order to demo improved gait efficiency.    Time 8    Period Weeks    Status New      PT LONG TERM GOAL #4   Title Pt will stand at [facility] with min guard with no UE support for at least 1 minute and 30 seconds in order to demo improved balance    Baseline 32.84 seconds on 09/12/20; 1 minute 4 seconds on 11/15/20    Time 8    Status On-going      PT LONG TERM GOAL #5   Title Pt will ambulate at least 230' over level surfaces with RW and supervision/min guard in order to demo improved household mobility and improved endurance    Baseline 345' distance today with min guard/min A    Time 8    Period Weeks    Status Revised              Plan - 01/23/21 1451     Clinical Impression Statement Today's aquatic session was focused on further functional strength/gait training, and static balance exercises focusing on erect porture. The pt performed all interventions with no issues noted and rest breaks as needed. The pt could continue to benefit from further skilled PT to address functional deficits.    Personal Factors and Comorbidities Comorbidity 3+;Past/Current Experience    Comorbidities T8 paraplegia, diabetes, HTN    Examination-Activity Limitations Locomotion Level;Transfers;Stand    Examination-Participation Restrictions Community Activity;Yard Work    Copy Evolving/Moderate complexity    Rehab Potential Good    PT Frequency 2x / week    PT Duration 12 weeks    PT Treatment/Interventions Aquatic Therapy;Manual techniques;Therapeutic exercise;Gait training;Neuromuscular re-education;Orthotic Fit/Training    PT Next Visit Plan continue to work on gait distance, step ups in parallel bars, side stepping. hip abductor strengthening, standing tolerance with decreased UE support, quadruped/tall kneeling for hip/core strengthening, LE stretching.    PT Home Exercise Plan Access Code: [email or web address]    Consulted and Agree with Plan of Care Patient             Patient will benefit from skilled therapeutic intervention in order to improve the following deficits and impairments:  Decreased balance, Decreased coordination, Decreased range of motion, Decreased strength, Decreased mobility, Postural dysfunction, Impaired tone, Decreased activity tolerance, Abnormal gait, Decreased endurance, Difficulty walking, Impaired sensation  Visit Diagnosis: Muscle weakness (generalized)  Unsteadiness on feet  Difficulty in walking, not elsewhere classified     Problem List Patient Active Problem List   Diagnosis Date Noted   Erectile dysfunction due to diseases classified elsewhere 03/02/2020   Wheelchair dependence 11/27/2019   Spasticity 08/03/2019   Paraplegia ([place]) 07/07/2019   Neurogenic bowel 07/07/2019   Neurogenic bladder 07/07/2019   Thoracic myelopathy 06/30/2019    Rondel Baton, SPTA 01/23/2021, 2:58 PM  [facility] [address] [place], [name], [address] Phone: [phone]   Fax:  [phone]  Name: Timothy Davenport MRN: [id number] Date of Birth: 13-Jun-1950

## 2021-01-25 ENCOUNTER — Ambulatory Visit: Payer: No Typology Code available for payment source | Admitting: Physical Therapy

## 2021-01-30 ENCOUNTER — Ambulatory Visit: Payer: No Typology Code available for payment source | Admitting: Physical Therapy

## 2021-02-02 ENCOUNTER — Other Ambulatory Visit: Payer: Self-pay

## 2021-02-02 ENCOUNTER — Ambulatory Visit: Payer: No Typology Code available for payment source | Admitting: Physical Therapy

## 2021-02-02 DIAGNOSIS — R2681 Unsteadiness on feet: Secondary | ICD-10-CM

## 2021-02-02 DIAGNOSIS — M6281 Muscle weakness (generalized): Secondary | ICD-10-CM | POA: Diagnosis not present

## 2021-02-02 DIAGNOSIS — R262 Difficulty in walking, not elsewhere classified: Secondary | ICD-10-CM

## 2021-02-02 DIAGNOSIS — R29818 Other symptoms and signs involving the nervous system: Secondary | ICD-10-CM

## 2021-02-02 NOTE — Therapy (Signed)
[facility] [address] [place], [name], [address] Phone: [phone]   Fax:  [phone]  Physical Therapy Treatment  Patient Details  Name: Timothy Davenport MRN: [id number] Date of Birth: 1950/03/12 Referring Provider (PT): Juel Burrow    Encounter Date: 02/02/2021   PT End of Session - 02/02/21 0938     Visit Number 47    Number of Visits 14    Date for PT Re-Evaluation 02/10/21    Authorization Type VA - 15 additional visits through 12/28/20, additional auth for 15 visits.    Authorization - Visit Number 7    Authorization - Number of Visits 15    Progress Note Due on Visit 66    PT Start Time 0933    PT Stop Time 1014    PT Time Calculation (min) 41 min    Equipment Utilized During Treatment Gait belt;Other (comment)   blue barbell and ankle weights   Activity Tolerance Patient tolerated treatment well    Behavior During Therapy WFL for tasks assessed/performed             Past Medical History:  Diagnosis Date   Colon polyp    Diabetes mellitus without complication ([facility])    Diabetic neuropathy ([facility])    HTN (hypertension)    Patella fracture    Renal calculi     Past Surgical History:  Procedure Laterality Date   LUMBAR LAMINECTOMY/DECOMPRESSION MICRODISCECTOMY N/A 07/01/2019   Procedure: LUMBAR LAMINECTOMY/DECOMPRESSION MICRODISCECTOMY 1 LEVEL, THORACIC SIX-SEVEN;  Surgeon: Consuella Lose, MD;  Location: [facility];  Service: Neurosurgery;  Laterality: N/A;  LUMBAR LAMINECTOMY/DECOMPRESSION MICRODISCECTOMY 1 LEVEL, THORACIC SIX-SEVEN    ORIF PATELLA     THORACIC DISCECTOMY N/A 07/03/2019   Procedure: Evacuation of Thoracic Hematoma;  Surgeon: Consuella Lose, MD;  Location: [facility];  Service: Neurosurgery;  Laterality: N/A;    There were no vitals filed for this visit.   Subjective Assessment - 02/02/21 0939     Subjective No falls, doing the same. Going to reach out to Dr. Dagoberto Ligas about  possible botox. May need to get referral from [place] first.    Pertinent History T8 paraplegia, diabetes, HTN    How long can you stand comfortably? probably a couple minutes with RW.    How long can you walk comfortably? approx. 5-10 minutes with his son and RW.    Patient Stated Goals wants to be able to walk without any assistance    Currently in Pain? No/denies                               [facility] Adult PT Treatment/Exercise - 02/02/21 0953       Transfers   Transfers Sit to Stand;Stand to Sit    Sit to Stand 4: Min guard    Stand to Sit 4: Min guard    Comments x6 reps from lower mat table with UE support and RW, cues for proper foot placement. Once in standing cues for tall posture and glute activation. Therapist helping to steady RW at times.      Ambulation/Gait   Ambulation/Gait Yes    Ambulation/Gait Assistance 4: Min guard;4: Min assist    Ambulation/Gait Assistance Details Pt min guard initially during 1st lap. Cues througout for tall posture, hip and knee extension. During 2nd lap pt more fatigued and demonstrating incr trunk and bilat knee flexion. Stopped and reset a couple times with tall  posture before continuing. after 2 laps took a seated rest break before ambulating an additional 115'.  Pt needing min A for RLE foot clearance during last lap to help clear foot and when turning to sit back down to mat due to fatigue. Pt with incr bilat knee flexion and forward flexed posture. W/c follow just in case but not needed.    Ambulation Distance (Feet) 230 Feet   x1, 115' x 1   Assistive device Rolling walker    Gait Pattern Decreased weight shift to right;Left flexed knee in stance;Right flexed knee in stance;Narrow base of support;Decreased step length - right;Decreased step length - left;Decreased stance time - right;Decreased hip/knee flexion - right;Step-through pattern;Poor foot clearance - right    Ambulation Surface Level;Indoor      Knee/Hip Exercises:  Aerobic   Stepper Scifit with BLE only at level 2.5 for 6 minutes strengthening, ROM, endurance. Cues for full ROM. Pt transferred laterally to SciFit seat from w/c                     PT Education - 02/02/21 1210     Education Details Scheduling more visits throughout remainder of [facility] visits, reaching out to [place] about referral for botox/discussing with Dr. Dagoberto Ligas next steps regarding spasticity.    Person(s) Educated Patient    Methods Explanation    Comprehension Verbalized understanding              PT Short Term Goals - 01/18/21 1250       PT SHORT TERM GOAL #1   Title Pt will undergo further assessment of 2MWT vs. 3MWT with LTG written as appropriate in order to demo improved gait efficiency. ALL STGS DUE 01/18/21    Baseline pt ambulated for 5 minutes and 30 seconds for 220 ft with min guard/assist with RW on 01/18/2021    Status Achieved    Target Date 01/18/21      PT SHORT TERM GOAL #2   Title Pt will stand at [facility] with min guard with no UE support for at least 1 minute 15 seconds in order to demo improved balance for ADLs.    Baseline best time of 20 seconds on 01/18/2021    Status Not Met      PT SHORT TERM GOAL #3   Title Pt will ambulate 19'  with RW and min guard throughout in order to demo improved household mobility.    Baseline 220' with RW, min guard/assist during last few feet, then mod assist of 2 for balance while chair brought to pt due to fatigue. 01/18/2021    Status Partially Met      PT SHORT TERM GOAL #4   Title In aquatic therapy, pt will be able to descend down pool steps with min guard and ascend out of pool steps with mod A with therapist in order to demo improved functional mobility.    Baseline will assess after next aquatic session.    Status On-going               PT Long Term Goals - 12/21/20 1421       PT LONG TERM GOAL #1   Title Pt and spouse will be independent with final HEP for BLE stretching and strengthening. ALL  LTGS DUE 02/15/21    Baseline pt will continue to benefit from ongoing additions    Time 8    Period Weeks    Status On-going    Target Date  02/15/21      PT LONG TERM GOAL #2   Title Pt will improve gait speed with RW and R AFO to at least 1.28 ft/sec in order to demo improved household mobility.    Baseline 29.92 = 1.09 ft/sec on 09/12/20; 37.31 seconds = .87 ft/sec on 11/15/20 (pt more tired from aquatic therapy yesterday)    Time 8    Period Weeks    Status On-going      PT LONG TERM GOAL #3   Title 2MWT vs. 3MWT goal to be written in order to demo improved gait efficiency.    Time 8    Period Weeks    Status New      PT LONG TERM GOAL #4   Title Pt will stand at [facility] with min guard with no UE support for at least 1 minute and 30 seconds in order to demo improved balance    Baseline 32.84 seconds on 09/12/20; 1 minute 4 seconds on 11/15/20    Time 8    Status On-going      PT LONG TERM GOAL #5   Title Pt will ambulate at least 230' over level surfaces with RW and supervision/min guard in order to demo improved household mobility and improved endurance    Baseline 345' distance today with min guard/min A    Time 8    Period Weeks    Status Revised                   Plan - 02/02/21 1211     Clinical Impression Statement Today's skilled session focused on gait training, endurance, and BLE strengthening. Pt able to walk a max of 230' with RW with min guard in a single bout today. Pt needing a seated rest break before ambulating an additional 115', needing min A to assist with RLE foot clearance. Pt with more flexed posture and bilat knee flexion when fatigued. Pt to reach out to Dr. Dagoberto Ligas about further spasticity management (possible Botox).    Personal Factors and Comorbidities Comorbidity 3+;Past/Current Experience    Comorbidities T8 paraplegia, diabetes, HTN    Examination-Activity Limitations Locomotion Level;Transfers;Stand    Examination-Participation Restrictions  Community Activity;Yard Work    [employer] officer Evolving/Moderate complexity    Rehab Potential Good    PT Frequency 2x / week    PT Duration 12 weeks    PT Treatment/Interventions Aquatic Therapy;Manual techniques;Therapeutic exercise;Gait training;Neuromuscular re-education;Orthotic Fit/Training    PT Next Visit Plan continue to work on gait distance, SciFit for endurance/ROM/strengthening. step ups in parallel bars, side stepping. hip abductor strengthening, standing tolerance with decreased UE support, quadruped/tall kneeling for hip/core strengthening, LE stretching.    PT Home Exercise Plan Access Code: [email or web address]    Consulted and Agree with Plan of Care Patient             Patient will benefit from skilled therapeutic intervention in order to improve the following deficits and impairments:  Decreased balance, Decreased coordination, Decreased range of motion, Decreased strength, Decreased mobility, Postural dysfunction, Impaired tone, Decreased activity tolerance, Abnormal gait, Decreased endurance, Difficulty walking, Impaired sensation  Visit Diagnosis: Muscle weakness (generalized)  Difficulty in walking, not elsewhere classified  Unsteadiness on feet  Other symptoms and signs involving the nervous system     Problem List Patient Active Problem List   Diagnosis Date Noted   Erectile dysfunction due to diseases classified elsewhere 03/02/2020   Wheelchair dependence 11/27/2019   Spasticity 08/03/2019   Paraplegia ([place])  07/07/2019   Neurogenic bowel 07/07/2019   Neurogenic bladder 07/07/2019   Thoracic myelopathy 06/30/2019    Arliss Journey, PT, DPT  02/02/2021, 12:13 PM  [facility] [address] [place], [name], [address] Phone: [phone]   Fax:  [phone]  Name: Timothy Davenport MRN: [id number] Date of Birth: 1950/09/23

## 2021-02-03 ENCOUNTER — Telehealth: Payer: Self-pay | Admitting: *Deleted

## 2021-02-03 NOTE — Telephone Encounter (Signed)
Mr Vitelli called with question about getting botox in his leg.  He states that he went to [facility] but they do not do it but they said Dr Dagoberto Ligas should be able to since she did it when he was in [place].  If not can she let one of the other doctors in her office do it. I let him know she ie out of office until 02/06/21. Please advise.

## 2021-02-06 ENCOUNTER — Other Ambulatory Visit: Payer: Self-pay

## 2021-02-06 ENCOUNTER — Encounter: Payer: Self-pay | Admitting: Physical Therapy

## 2021-02-06 ENCOUNTER — Ambulatory Visit: Payer: No Typology Code available for payment source | Admitting: Physical Therapy

## 2021-02-06 DIAGNOSIS — M6281 Muscle weakness (generalized): Secondary | ICD-10-CM | POA: Diagnosis not present

## 2021-02-06 DIAGNOSIS — R2681 Unsteadiness on feet: Secondary | ICD-10-CM

## 2021-02-06 DIAGNOSIS — R262 Difficulty in walking, not elsewhere classified: Secondary | ICD-10-CM

## 2021-02-06 DIAGNOSIS — R293 Abnormal posture: Secondary | ICD-10-CM

## 2021-02-06 NOTE — Therapy (Signed)
[facility] [address] [place], [name], [address] Phone: [phone]   Fax:  [phone]  Physical Therapy Treatment  Patient Details  Name: Timothy Davenport MRN: [id number] Date of Birth: Nov 05, 1950 Referring Provider (PT): Juel Burrow    Encounter Date: 02/06/2021   PT End of Session - 02/06/21 1912     Visit Number 70    Number of Visits 42    Date for PT Re-Evaluation 02/10/21    Authorization Type VA - 15 additional visits through 12/28/20, additional auth for 15 visits.    Authorization - Visit Number 8    Authorization - Number of Visits 15    Progress Note Due on Visit 66    PT Start Time 6803    PT Stop Time 1442    PT Time Calculation (min) 47 min    Equipment Utilized During Treatment Gait belt;Other (comment)   aquatic step, large bar bell, aquatic weights   Activity Tolerance Patient tolerated treatment well    Behavior During Therapy WFL for tasks assessed/performed             Past Medical History:  Diagnosis Date   Colon polyp    Diabetes mellitus without complication ([facility])    Diabetic neuropathy ([facility])    HTN (hypertension)    Patella fracture    Renal calculi     Past Surgical History:  Procedure Laterality Date   LUMBAR LAMINECTOMY/DECOMPRESSION MICRODISCECTOMY N/A 07/01/2019   Procedure: LUMBAR LAMINECTOMY/DECOMPRESSION MICRODISCECTOMY 1 LEVEL, THORACIC SIX-SEVEN;  Surgeon: Consuella Lose, MD;  Location: [facility];  Service: Neurosurgery;  Laterality: N/A;  LUMBAR LAMINECTOMY/DECOMPRESSION MICRODISCECTOMY 1 LEVEL, THORACIC SIX-SEVEN    ORIF PATELLA     THORACIC DISCECTOMY N/A 07/03/2019   Procedure: Evacuation of Thoracic Hematoma;  Surgeon: Consuella Lose, MD;  Location: [facility];  Service: Neurosurgery;  Laterality: N/A;    There were no vitals filed for this visit.   Subjective Assessment - 02/06/21 1909     Subjective No new complaints. No falls or pain to report.     Pertinent History T8 paraplegia, diabetes, HTN    How long can you stand comfortably? probably a couple minutes with RW.    How long can you walk comfortably? approx. 5-10 minutes with his son and RW.    Patient Stated Goals wants to be able to walk without any assistance    Pain Score 0-No pain             Aquatic therapy at [facility] - pool temp 96 degrees   Patient seen for aquatic therapy today.  Treatment took place in water 3.5-4.5 feet deep depending upon activity.  Pt entered/exited pool via stairs with bil hand rails. Min guard to enter pool and mod assist to exit pool with assist needed to advance LE's up to next step with last 3 of 5 steps (the steps out of the water).   Pt used all to walk over to bench in water with min guard assist.  At bench the following was performed:  In long sitting on bench- passive heel cord stretching for 30 sec's x 3 reps each side.  Seated at edge of bench with feet on aquatic step: Hamstring stretching with assist to maintain knee extension and to achor other foot on step for 30 sec's x 3 reps each side Sit<>stands with bil HHA with feet in parallel position for 10 reps, then with feet in staggered stance for 10 reps each foot forward  With 2.5# ankle weights: long arc quads for 10 reps with assist for form as pt tends to elevate thigh, alternating marching for 10 reps each side with UE support for stability. Then ended with flow exercise as follows: knee extension with DF<>into hip abduction under water surface in available hip range<>hip adduction back to neutral<>knee flexion to bring foot back to step for 10 reps on each side with cues on form and technique. UE assist needed to remain anchored on bench.  In ~4.8 foot depth water  Use of large bar bell with min/mod assist to walk from shallow water into deeper water Forward gait for 4 laps across pool/back with cues for step length and heel to toe step pattern Backward gait for 4 laps across  pool/back with cues for posture, step length Side stepping left<>right for 4 laps each way with cues on posture and to keep feet forward with stepping Min guard to min assist for balance with all gait above     Pt requires buoyancy of water for support for reduced fall risk with gait training and balance exercises with minimal UE support; exercises able to be performed safely in water without the risk of fall compared to those same exercises performed on land;  viscosity of water needed for resistance for strengthening.  Current of water provides perturbations for challenging static & dynamic standing balance.            PT Short Term Goals - 01/18/21 1250       PT SHORT TERM GOAL #1   Title Pt will undergo further assessment of 2MWT vs. 3MWT with LTG written as appropriate in order to demo improved gait efficiency. ALL STGS DUE 01/18/21    Baseline pt ambulated for 5 minutes and 30 seconds for 220 ft with min guard/assist with RW on 01/18/2021    Status Achieved    Target Date 01/18/21      PT SHORT TERM GOAL #2   Title Pt will stand at [facility] with min guard with no UE support for at least 1 minute 15 seconds in order to demo improved balance for ADLs.    Baseline best time of 20 seconds on 01/18/2021    Status Not Met      PT SHORT TERM GOAL #3   Title Pt will ambulate 66'  with RW and min guard throughout in order to demo improved household mobility.    Baseline 220' with RW, min guard/assist during last few feet, then mod assist of 2 for balance while chair brought to pt due to fatigue. 01/18/2021    Status Partially Met      PT SHORT TERM GOAL #4   Title In aquatic therapy, pt will be able to descend down pool steps with min guard and ascend out of pool steps with mod A with therapist in order to demo improved functional mobility.    Baseline will assess after next aquatic session.    Status On-going               PT Long Term Goals - 12/21/20 1421       PT LONG TERM  GOAL #1   Title Pt and spouse will be independent with final HEP for BLE stretching and strengthening. ALL LTGS DUE 02/15/21    Baseline pt will continue to benefit from ongoing additions    Time 8    Period Weeks    Status On-going    Target Date 02/15/21  PT LONG TERM GOAL #2   Title Pt will improve gait speed with RW and R AFO to at least 1.28 ft/sec in order to demo improved household mobility.    Baseline 29.92 = 1.09 ft/sec on 09/12/20; 37.31 seconds = .87 ft/sec on 11/15/20 (pt more tired from aquatic therapy yesterday)    Time 8    Period Weeks    Status On-going      PT LONG TERM GOAL #3   Title 2MWT vs. 3MWT goal to be written in order to demo improved gait efficiency.    Time 8    Period Weeks    Status New      PT LONG TERM GOAL #4   Title Pt will stand at [facility] with min guard with no UE support for at least 1 minute and 30 seconds in order to demo improved balance    Baseline 32.84 seconds on 09/12/20; 1 minute 4 seconds on 11/15/20    Time 8    Status On-going      PT LONG TERM GOAL #5   Title Pt will ambulate at least 230' over level surfaces with RW and supervision/min guard in order to demo improved household mobility and improved endurance    Baseline 345' distance today with min guard/min A    Time 8    Period Weeks    Status Revised                   Plan - 02/06/21 1913     Clinical Impression Statement Today's skilled session continued to focus on stretching, strengthening and gait/balance in the aquatic setting. No issues noted or reported in session. Pt progressed to walking in deeper water with large bar bell with min guard to occasional min assist this session. The pt is making progress and should benefit from continued PT to progress toward unmet goals.    Personal Factors and Comorbidities Comorbidity 3+;Past/Current Experience    Comorbidities T8 paraplegia, diabetes, HTN    Examination-Activity Limitations Locomotion Level;Transfers;Stand     Examination-Participation Restrictions Community Activity;Yard Work    [employer] officer Evolving/Moderate complexity    Rehab Potential Good    PT Frequency 2x / week    PT Duration 12 weeks    PT Treatment/Interventions Aquatic Therapy;Manual techniques;Therapeutic exercise;Gait training;Neuromuscular re-education;Orthotic Fit/Training    PT Next Visit Plan continue to work on gait distance, SciFit for endurance/ROM/strengthening. step ups in parallel bars, side stepping. hip abductor strengthening, standing tolerance with decreased UE support, quadruped/tall kneeling for hip/core strengthening, LE stretching.    PT Home Exercise Plan Access Code: [email or web address]    [id number] and Agree with Plan of Care Patient             Patient will benefit from skilled therapeutic intervention in order to improve the following deficits and impairments:  Decreased balance, Decreased coordination, Decreased range of motion, Decreased strength, Decreased mobility, Postural dysfunction, Impaired tone, Decreased activity tolerance, Abnormal gait, Decreased endurance, Difficulty walking, Impaired sensation  Visit Diagnosis: Muscle weakness (generalized)  Difficulty in walking, not elsewhere classified  Unsteadiness on feet  Abnormal posture     Problem List Patient Active Problem List   Diagnosis Date Noted   Erectile dysfunction due to diseases classified elsewhere 03/02/2020   Wheelchair dependence 11/27/2019   Spasticity 08/03/2019   Paraplegia ([place]) 07/07/2019   Neurogenic bowel 07/07/2019   Neurogenic bladder 07/07/2019   Thoracic myelopathy 06/30/2019    Willow Ora, PTA, CLT [facility]  [address], [place], [place] [address] [phone] 02/06/21, 9:08 PM   Name: Timothy Davenport MRN: [id number] Date of Birth: 11/03/1950

## 2021-02-07 NOTE — Telephone Encounter (Signed)
I let him know Dr Florentina Jenny reply. She will see him first at his appt 03/08/21.

## 2021-02-09 ENCOUNTER — Ambulatory Visit: Payer: No Typology Code available for payment source | Admitting: Physical Therapy

## 2021-02-09 ENCOUNTER — Encounter: Payer: Self-pay | Admitting: Physical Therapy

## 2021-02-09 ENCOUNTER — Other Ambulatory Visit: Payer: Self-pay

## 2021-02-09 DIAGNOSIS — R2681 Unsteadiness on feet: Secondary | ICD-10-CM

## 2021-02-09 DIAGNOSIS — M6281 Muscle weakness (generalized): Secondary | ICD-10-CM

## 2021-02-09 DIAGNOSIS — R293 Abnormal posture: Secondary | ICD-10-CM

## 2021-02-09 DIAGNOSIS — R262 Difficulty in walking, not elsewhere classified: Secondary | ICD-10-CM

## 2021-02-09 NOTE — Therapy (Addendum)
[facility] [address] [place], [name], [address] Phone: [phone]   Fax:  [phone]  Physical Therapy Treatment  Patient Details  Name: Timothy Davenport MRN: [id number] Date of Birth: 04-Nov-1950 Referring Provider (PT): Juel Burrow    Encounter Date: 02/09/2021   PT End of Session - 02/09/21 1038     Visit Number 71    Number of Visits 17    Date for PT Re-Evaluation 02/10/21    Authorization Type VA - 15 additional visits through 12/28/20, additional auth for 15 visits.    Authorization - Visit Number 9    Authorization - Number of Visits 15    Progress Note Due on Visit 18    PT Start Time 9381    PT Stop Time 1057    PT Time Calculation (min) 42 min    Equipment Utilized During Treatment Gait belt    Activity Tolerance Patient tolerated treatment well    Behavior During Therapy WFL for tasks assessed/performed             Past Medical History:  Diagnosis Date   Colon polyp    Diabetes mellitus without complication ([place])    Diabetic neuropathy ([place])    HTN (hypertension)    Patella fracture    Renal calculi     Past Surgical History:  Procedure Laterality Date   LUMBAR LAMINECTOMY/DECOMPRESSION MICRODISCECTOMY N/A 07/01/2019   Procedure: LUMBAR LAMINECTOMY/DECOMPRESSION MICRODISCECTOMY 1 LEVEL, THORACIC SIX-SEVEN;  Surgeon: Consuella Lose, MD;  Location: [facility];  Service: Neurosurgery;  Laterality: N/A;  LUMBAR LAMINECTOMY/DECOMPRESSION MICRODISCECTOMY 1 LEVEL, THORACIC SIX-SEVEN    ORIF PATELLA     THORACIC DISCECTOMY N/A 07/03/2019   Procedure: Evacuation of Thoracic Hematoma;  Surgeon: Consuella Lose, MD;  Location: [facility];  Service: Neurosurgery;  Laterality: N/A;    There were no vitals filed for this visit.   Subjective Assessment - 02/09/21 1018     Subjective Seeing Dr. Dagoberto Ligas in January to check to see how the increase in dantrolene is doing before she considers Botox. No  falls.    Pertinent History T8 paraplegia, diabetes, HTN    How long can you stand comfortably? probably a couple minutes with RW.    How long can you walk comfortably? approx. 5-10 minutes with his son and RW.    Patient Stated Goals wants to be able to walk without any assistance    Currently in Pain? No/denies                                02/09/21 1030  Transfers  Transfers Sit to Stand;Stand to Sit  Sit to Stand 4: Min guard  Stand to Sit 4: Min guard  Comments From mat table > RW. Performed x3 sit <> stands prior to gait.  Ambulation/Gait  Ambulation/Gait Yes  Ambulation/Gait Assistance 4: Min guard  Ambulation/Gait Assistance Details Cues for tall posture/hip and knee extension throughout gait and for foot placement with RLE for wider BOS. Wheelchair follow during 3rd lap in case pt fatigued, but not needed. Pt able to be more erect during gait this session, only requiring min guard throughout, even during 3rd lap.  Ambulation Distance (Feet) 345 Feet  Assistive device Rolling walker  Gait Pattern Decreased weight shift to right;Left flexed knee in stance;Right flexed knee in stance;Narrow base of support;Decreased step length - right;Decreased step length - left;Decreased stance time - right;Decreased hip/knee flexion -  right;Step-through pattern;Poor foot clearance - right  Ambulation Surface Level;Indoor  Gait Comments In 3 minutes pt able to ambulate 190' with RW  Neuro Re-ed   Neuro Re-ed Details  Standing in // bars: holding balance with/without UE support: x1 minute, x50 seconds - cues throughout for tall posture and hip extension. With BUE support: 2 x 10 reps each leg stepping forwards to target, min guard on RLE and cues for weight shifting/standing tall.  Exercises  Exercises Other Exercises  Other Exercises  With BUE support in // bars: x10 reps mini squats, cues for full hip extension.  Knee/Hip Exercises: Aerobic  Stepper Scifit with BLE only  at level 2.5 for 5 minutes strengthening, ROM, endurance. Cues for full ROM. Pt transferred laterally to SciFit seat from w/c with supervision.            PT Short Term Goals - 01/18/21 1250       PT SHORT TERM GOAL #1   Title Pt will undergo further assessment of 2MWT vs. 3MWT with LTG written as appropriate in order to demo improved gait efficiency. ALL STGS DUE 01/18/21    Baseline pt ambulated for 5 minutes and 30 seconds for 220 ft with min guard/assist with RW on 01/18/2021    Status Achieved    Target Date 01/18/21      PT SHORT TERM GOAL #2   Title Pt will stand at [facility] with min guard with no UE support for at least 1 minute 15 seconds in order to demo improved balance for ADLs.    Baseline best time of 20 seconds on 01/18/2021    Status Not Met      PT SHORT TERM GOAL #3   Title Pt will ambulate 29'  with RW and min guard throughout in order to demo improved household mobility.    Baseline 220' with RW, min guard/assist during last few feet, then mod assist of 2 for balance while chair brought to pt due to fatigue. 01/18/2021    Status Partially Met      PT SHORT TERM GOAL #4   Title In aquatic therapy, pt will be able to descend down pool steps with min guard and ascend out of pool steps with mod A with therapist in order to demo improved functional mobility.    Baseline will assess after next aquatic session.    Status On-going               PT Long Term Goals - 02/13/21 1448       PT LONG TERM GOAL #1   Title Pt and spouse will be independent with final HEP for BLE stretching and strengthening. ALL LTGS DUE 02/15/21    Baseline pt will continue to benefit from ongoing additions    Time 8    Period Weeks    Status On-going    Target Date 02/15/21      PT LONG TERM GOAL #2   Title Pt will improve gait speed with RW and R AFO to at least 1.28 ft/sec in order to demo improved household mobility.    Baseline 29.92 = 1.09 ft/sec on 09/12/20; 37.31 seconds =  .87 ft/sec on 11/15/20 (pt more tired from aquatic therapy yesterday)    Time 8    Period Weeks    Status On-going      PT LONG TERM GOAL #3   Title 2MWT vs. 3MWT goal to be written in order to demo improved gait efficiency.  Baseline 3MWT: 190' on 02/09/21    Time 8    Period Weeks    Status Achieved      PT LONG TERM GOAL #4   Title Pt will stand at [facility] with min guard with no UE support for at least 1 minute and 30 seconds in order to demo improved balance    Baseline 32.84 seconds on 09/12/20; 1 minute 4 seconds on 11/15/20    Time 8    Status On-going      PT LONG TERM GOAL #5   Title Pt will ambulate at least 230' over level surfaces with RW and supervision/min guard in order to demo improved household mobility and improved endurance    Baseline 345' distance with min guard on 02/09/21    Time 8    Period Weeks    Status Achieved                 02/13/21 1450  Plan  Clinical Impression Statement Began to check pt's LTGs today. Pt met LTGs #3 and #5. Pt with improvement in gait endurance and gait quality today. Able to ambulate 17' with RW consecutively with min guard throughout. Pt able to maintain more upright posture throughout gait today. Will continue to progress toward LTGs.  Personal Factors and Comorbidities Comorbidity 3+;Past/Current Experience  Comorbidities T8 paraplegia, diabetes, HTN  Examination-Activity Limitations Locomotion Level;Transfers;Stand  Examination-Participation Restrictions Community Activity;Yard Work  Pt will benefit from skilled therapeutic intervention in order to improve on the following deficits Decreased balance;Decreased coordination;Decreased range of motion;Decreased strength;Decreased mobility;Postural dysfunction;Impaired tone;Decreased activity tolerance;Abnormal gait;Decreased endurance;Difficulty walking;Impaired sensation  Stability/Clinical Decision Making Evolving/Moderate complexity  Rehab Potential Good  PT Frequency 2x /  week  PT Duration 12 weeks  PT Treatment/Interventions Aquatic Therapy;Manual techniques;Therapeutic exercise;Gait training;Neuromuscular re-education;Orthotic Fit/Training  PT Next Visit Plan check remainder of LTGS and re-cert. continue to work on gait distance, [place] for endurance/ROM/strengthening. step ups in parallel bars, side stepping. hip abductor strengthening, standing tolerance with decreased UE support, quadruped/tall kneeling for hip/core strengthening, LE stretching.  PT Home Exercise Plan Access Code: [email or web address]  Consulted and Agree with Plan of Care Patient      Patient will benefit from skilled therapeutic intervention in order to improve the following deficits and impairments:     Visit Diagnosis: Muscle weakness (generalized)  Difficulty in walking, not elsewhere classified  Abnormal posture  Unsteadiness on feet     Problem List Patient Active Problem List   Diagnosis Date Noted   Erectile dysfunction due to diseases classified elsewhere 03/02/2020   Wheelchair dependence 11/27/2019   Spasticity 08/03/2019   Paraplegia ([place]) 07/07/2019   Neurogenic bowel 07/07/2019   Neurogenic bladder 07/07/2019   Thoracic myelopathy 06/30/2019    Arliss Journey, PT, DPT  02/09/2021, 11:00 AM  [facility] [address] [place], [name], [address] Phone: [phone]   Fax:  [phone]  Name: Timothy Davenport MRN: [id number] Date of Birth: [birth year]

## 2021-02-27 ENCOUNTER — Ambulatory Visit: Payer: No Typology Code available for payment source | Admitting: Physical Therapy

## 2021-03-01 ENCOUNTER — Other Ambulatory Visit: Payer: Self-pay

## 2021-03-01 ENCOUNTER — Ambulatory Visit: Payer: No Typology Code available for payment source | Attending: Family Medicine | Admitting: Physical Therapy

## 2021-03-01 ENCOUNTER — Encounter: Payer: Self-pay | Admitting: Physical Therapy

## 2021-03-01 DIAGNOSIS — M6281 Muscle weakness (generalized): Secondary | ICD-10-CM | POA: Diagnosis present

## 2021-03-01 DIAGNOSIS — R29818 Other symptoms and signs involving the nervous system: Secondary | ICD-10-CM | POA: Diagnosis present

## 2021-03-01 DIAGNOSIS — R2681 Unsteadiness on feet: Secondary | ICD-10-CM | POA: Diagnosis present

## 2021-03-01 DIAGNOSIS — R262 Difficulty in walking, not elsewhere classified: Secondary | ICD-10-CM | POA: Diagnosis present

## 2021-03-01 DIAGNOSIS — R293 Abnormal posture: Secondary | ICD-10-CM | POA: Insufficient documentation

## 2021-03-01 NOTE — Therapy (Addendum)
[facility] [address] [name], [name], [address] Phone: [phone]   Fax:  [phone]  Physical Therapy Treatment/Re-Cert  Patient Details  Name: Timothy Davenport MRN: [id number] Date of Birth: 01-23-1951 Referring Provider (PT): Juel Burrow    Encounter Date: 03/01/2021   PT End of Session - 03/01/21 1024     Visit Number 72    Number of Visits 60    Date for PT Re-Evaluation 56/38/93   per re-cert on 7/34/28   Authorization Type VA - 15 additional visits through 12/28/20, additional auth for 15 visits.    Authorization - Visit Number 10    Authorization - Number of Visits 15    Progress Note Due on Visit 59    PT Start Time 1022   PT running late   PT Stop Time 1102    PT Time Calculation (min) 40 min    Equipment Utilized During Treatment Gait belt    Activity Tolerance Patient tolerated treatment well    Behavior During Therapy WFL for tasks assessed/performed             Past Medical History:  Diagnosis Date   Colon polyp    Diabetes mellitus without complication ([place])    Diabetic neuropathy ([place])    HTN (hypertension)    Patella fracture    Renal calculi     Past Surgical History:  Procedure Laterality Date   LUMBAR LAMINECTOMY/DECOMPRESSION MICRODISCECTOMY N/A 07/01/2019   Procedure: LUMBAR LAMINECTOMY/DECOMPRESSION MICRODISCECTOMY 1 LEVEL, THORACIC SIX-SEVEN;  Surgeon: Consuella Lose, MD;  Location: [facility];  Service: Neurosurgery;  Laterality: N/A;  LUMBAR LAMINECTOMY/DECOMPRESSION MICRODISCECTOMY 1 LEVEL, THORACIC SIX-SEVEN    ORIF PATELLA     THORACIC DISCECTOMY N/A 07/03/2019   Procedure: Evacuation of Thoracic Hematoma;  Surgeon: Consuella Lose, MD;  Location: [facility];  Service: Neurosurgery;  Laterality: N/A;    There were no vitals filed for this visit.   Subjective Assessment - 03/01/21 1024     Subjective Had a good holiday season. Had to miss his aquatic appt on Monday.  No falls.    Pertinent History T8 paraplegia, diabetes, HTN    How long can you stand comfortably? probably a couple minutes with RW.    How long can you walk comfortably? approx. 5-10 minutes with his son and RW.    Patient Stated Goals wants to be able to walk without any assistance    Currently in Pain? No/denies                [facility] PT Assessment - 03/01/21 1030       Assessment   Medical Diagnosis T8 paraplegia    Referring Provider (PT) Piva, Enrico,DO     Onset Date/Surgical Date 06/30/19    Hand Dominance Right      Prior Function   Level of Independence Independent                           [facility] Adult PT Treatment/Exercise - 03/01/21 1030       Transfers   Transfers Sit to Stand;Stand to Sit    Sit to Stand 4: Min guard;4: Min assist    Sit to Stand Details Verbal cues for technique;Verbal cues for precautions/safety;Verbal cues for safe use of DME/AE    Stand to Sit 4: Min guard    Stand to Sit Details (indicate cue type and reason) Verbal cues for sequencing;Verbal cues for technique;Verbal cues  for safe use of DME/AE    Comments From lower mat table x5 reps at start of session with cued for tall posture in standing. Initially min A> min guard. Performed additional reps with min guard with pt needing incr time to stand today from lower surface      Ambulation/Gait   Ambulation/Gait Yes    Ambulation/Gait Assistance 4: Min guard    Ambulation/Gait Assistance Details Cues for tall posture and hip/knee extension throughout gait    Ambulation Distance (Feet) 115 Feet   x2 - one lap at start of session and one at end   Assistive device Rolling walker    Gait Pattern Decreased weight shift to right;Left flexed knee in stance;Right flexed knee in stance;Narrow base of support;Decreased step length - right;Decreased step length - left;Decreased stance time - right;Decreased hip/knee flexion - right;Step-through pattern;Poor foot clearance - right     Ambulation Surface Level;Indoor    Gait velocity 34.19 seconds = .95 ft/sec      Neuro Re-ed    Neuro Re-ed Details  Standing at [place] without UE support: 15 seconds, 22 seconds with close min guard. Cues for posture and bilat knee extension/hip extension. Pt with tendency to keep weight shifted to L.  Long sitting on green balance disc on mat table (took incr time to get pt seated on disc): pt holding 3# ball; x8 reps forward lean for additonal hamstring stretch and back to midline, x5 reps trunk rotations each side, x5 reps leaning backwards and then coming up to sit tall (pt fatigued easily with this). Cues for posture      Exercises   Exercises Other Exercises    Other Exercises  Seated hamstring stretch - pt placing leg on therapist's leg from edge of mat 3 x 30 seconds each side with additional over pressure from therapist. Cued for tall posture for more of a stretch. Educated on importance of making sure to stretch at home daily and pt can perform this stretch by even using a step to place leg on.                       PT Short Term Goals - 01/18/21 1250       PT SHORT TERM GOAL #1   Title Pt will undergo further assessment of 2MWT vs. 3MWT with LTG written as appropriate in order to demo improved gait efficiency. ALL STGS DUE 01/18/21    Baseline pt ambulated for 5 minutes and 30 seconds for 220 ft with min guard/assist with RW on 01/18/2021    Status Achieved    Target Date 01/18/21      PT SHORT TERM GOAL #2   Title Pt will stand at [facility] with min guard with no UE support for at least 1 minute 15 seconds in order to demo improved balance for ADLs.    Baseline best time of 20 seconds on 01/18/2021    Status Not Met      PT SHORT TERM GOAL #3   Title Pt will ambulate 81'  with RW and min guard throughout in order to demo improved household mobility.    Baseline 220' with RW, min guard/assist during last few feet, then mod assist of 2 for balance while chair brought to pt  due to fatigue. 01/18/2021    Status Partially Met      PT SHORT TERM GOAL #4   Title In aquatic therapy, pt will be able to descend down pool  steps with min guard and ascend out of pool steps with mod A with therapist in order to demo improved functional mobility.    Baseline will assess after next aquatic session.    Status On-going            Updated STGs for re-cert:  PT Short Term Goals - 03/01/21 1250       PT SHORT TERM GOAL #1   Title Pt will improve 3MWT distance with RW to at least 205' in order to demo improved gait efficiency. ALL STGS DUE 03/29/21    Baseline 190' on 02/09/21    Time 4    Period Weeks    Status New    Target Date 03/29/21      PT SHORT TERM GOAL #2   Title Pt will perform sit <> stand from lower mat table consistently with min guard in order to demo improved functional transfers/strength/    Baseline needs min guard/min A    Time 4    Period Weeks    Status New      PT SHORT TERM GOAL #4   Title In aquatic therapy, pt will be able to descend down pool steps with min guard and ascend out of pool steps with mod A with therapist in order to demo improved functional mobility.    Baseline will assess at aquatic session    Time 4    Period Weeks    Status On-going               PT Long Term Goals - 03/01/21 1030       PT LONG TERM GOAL #1   Title Pt and spouse will be independent with final HEP for BLE stretching and strengthening. ALL LTGS DUE 02/15/21    Baseline pt will continue to benefit from ongoing additions and review, pt reports performing at home    Time 8    Period Weeks    Status Partially Met    Target Date 02/15/21      PT LONG TERM GOAL #2   Title Pt will improve gait speed with RW and R AFO to at least 1.28 ft/sec in order to demo improved household mobility.    Baseline 29.92 = 1.09 ft/sec on 09/12/20; 37.31 seconds = .87 ft/sec on 11/15/20 (pt more tired from aquatic therapy yesterday); 34.19 seconds = .95 ft/sec on  03/01/21    Time 8    Period Weeks    Status Not Met      PT LONG TERM GOAL #3   Title 2MWT vs. 3MWT goal to be written in order to demo improved gait efficiency.    Baseline 3MWT: 190' on 02/09/21    Time 8    Period Weeks    Status Achieved      PT LONG TERM GOAL #4   Title Pt will stand at [facility] with min guard with no UE support for at least 1 minute and 30 seconds in order to demo improved balance    Baseline 32.84 seconds on 09/12/20; 1 minute 4 seconds on 11/15/20, best time of 22 seconds on 03/01/21    Time 8    Status Not Met      PT LONG TERM GOAL #5   Title Pt will ambulate at least 230' over level surfaces with RW and supervision/min guard in order to demo improved household mobility and improved endurance    Baseline 345' distance with min guard on 02/09/21, but assist does  fluctuate based on fatigue    Time 8    Period Weeks    Status Achieved            Updated LTGs:    PT Long Term Goals - 03/01/21 1252       PT LONG TERM GOAL #1   Title Pt and spouse will be independent with final HEP for BLE stretching and strengthening. ALL LTGS DUE 04/26/21    Baseline pt will continue to benefit from ongoing additions and review, pt reports performing at home    Time 8    Period Weeks    Status On-going    Target Date 04/26/21      PT LONG TERM GOAL #2   Title Pt will improve gait speed with RW and R AFO to at least 1.05 ft/sec in order to demo improved household mobility.    Baseline 37.31 seconds = .87 ft/sec on 11/15/20 (pt more tired from aquatic therapy yesterday); 34.19 seconds = .95 ft/sec on 03/01/21    Time 8    Period Weeks    Status Revised      PT LONG TERM GOAL #3   Title Pt will undergo 6MWT for gait endurance with RW with LTG updated.    Baseline 3MWT: 190' on 02/09/21    Time 8    Period Weeks    Status New      PT LONG TERM GOAL #4   Title Pt will stand at [facility] with min guard with no UE support with min guard for at least 40 seconds in order to demo  improved balance for ADLs    Baseline 32.84 seconds on 09/12/20; 1 minute 4 seconds on 11/15/20, best time of 22 seconds on 03/01/21    Time 8    Period Weeks    Status Revised      PT LONG TERM GOAL #5   Title Pt will ambulate at least 345' over level surfaces with RW and min guard consistently in order to demo improved household mobility and improved endurance    Baseline 345' distance with min guard on 02/09/21, but assist does fluctuate based on fatigue    Time 8    Period Weeks    Status Revised                 Plan - 03/01/21 1243     Clinical Impression Statement Today's skilled session focused on assessing remainder of pt's LTGs. Pt did not meet LTGs #2 and #4. Pt's gait speed today was .95 ft/sec with use of RW and R AFO (previously .87 ft/sec), but did not meet goal level. With standing balance with no UE support at RW, pt only able to hold for a max of 22 seconds today with close min guard and incr weight shift to L. Pt partially met LTG #1 and met LTGs #3 and #5. Pt able to ambulate 345' consecutively with RW and min guard at last session with more erect posture. However, assist level varies with pt's fatigue and pt sometimes needing up to min A for balance when more fatigued. Pt to see Dr. Dagoberto Ligas in regards to spasticity management. Pt will continue to benefit from skilled PT in order to work on gait, balance, strength, ROM, functional transfers in order to demo improved functional mobility and independence. STGs/LTGs updated as appropriate.    Personal Factors and Comorbidities Comorbidity 3+;Past/Current Experience    Comorbidities T8 paraplegia, diabetes, HTN    Examination-Activity Limitations Locomotion  Level;Transfers;Stand    Examination-Participation Restrictions Community Activity;Yard Work    [employer] Evolving/Moderate complexity    Rehab Potential Good    PT Frequency 2x / week    PT Duration 12 weeks    PT Treatment/Interventions  Aquatic Therapy;Manual techniques;Therapeutic exercise;Gait training;Neuromuscular re-education;Orthotic Fit/Training    PT Next Visit Plan continue to work on gait distance, SciFit for endurance/ROM/strengthening. step ups in parallel bars, side stepping. hip abductor strengthening, standing tolerance with decreased UE support, quadruped/tall kneeling for hip/core strengthening, LE stretching.    PT Home Exercise Plan Access Code: [email or web address]    Consulted and Agree with Plan of Care Patient             Patient will benefit from skilled therapeutic intervention in order to improve the following deficits and impairments:  Decreased balance, Decreased coordination, Decreased range of motion, Decreased strength, Decreased mobility, Postural dysfunction, Impaired tone, Decreased activity tolerance, Abnormal gait, Decreased endurance, Difficulty walking, Impaired sensation  Visit Diagnosis: Muscle weakness (generalized)  Abnormal posture  Difficulty in walking, not elsewhere classified  Unsteadiness on feet     Problem List Patient Active Problem List   Diagnosis Date Noted   Erectile dysfunction due to diseases classified elsewhere 03/02/2020   Wheelchair dependence 11/27/2019   Spasticity 08/03/2019   Paraplegia ([place]) 07/07/2019   Neurogenic bowel 07/07/2019   Neurogenic bladder 07/07/2019   Thoracic myelopathy 06/30/2019    Arliss Journey, PT, DPT  03/01/2021, 12:49 PM  [facility] [address] [place], [name], [address] Phone: [phone]   Fax:  [phone]  Name: Timothy Davenport MRN: [id number] Date of Birth: 23-Oct-1950

## 2021-03-01 NOTE — Addendum Note (Signed)
Addended by: Arliss Journey on: 03/01/2021 12:58 PM   Modules accepted: Orders

## 2021-03-06 ENCOUNTER — Other Ambulatory Visit: Payer: Self-pay

## 2021-03-06 ENCOUNTER — Encounter: Payer: Self-pay | Admitting: Physical Therapy

## 2021-03-06 ENCOUNTER — Ambulatory Visit: Payer: BC Managed Care – PPO | Admitting: Physical Therapy

## 2021-03-06 DIAGNOSIS — M6281 Muscle weakness (generalized): Secondary | ICD-10-CM

## 2021-03-06 DIAGNOSIS — R262 Difficulty in walking, not elsewhere classified: Secondary | ICD-10-CM

## 2021-03-06 DIAGNOSIS — R2681 Unsteadiness on feet: Secondary | ICD-10-CM

## 2021-03-06 NOTE — Therapy (Signed)
[facility] [address] [place], [name], [address] Phone: [phone]   Fax:  [phone]  Physical Therapy Treatment  Patient Details  Name: Timothy Davenport MRN: [id number] Date of Birth: 01/11/1951 Referring Provider (PT): Juel Burrow    Encounter Date: 03/06/2021   PT End of Session - 03/06/21 1905     Visit Number 45    Number of Visits 76    Date for PT Re-Evaluation 65/03/54   per re-cert on 6/56/81   Authorization Type [facility] - 15 additional visits through 12/28/20, additional auth for 15 visits.    Authorization - Visit Number 11    Authorization - Number of Visits 15    Progress Note Due on Visit 59    PT Start Time 1501    PT Stop Time 1547    PT Time Calculation (min) 46 min    Equipment Utilized During Treatment Gait belt;Other (comment)   used large barbell and pool noodle, aquatic weights   Activity Tolerance Patient tolerated treatment well    Behavior During Therapy WFL for tasks assessed/performed             Past Medical History:  Diagnosis Date   Colon polyp    Diabetes mellitus without complication (HCC)    Diabetic neuropathy ([place])    HTN (hypertension)    Patella fracture    Renal calculi     Past Surgical History:  Procedure Laterality Date   LUMBAR LAMINECTOMY/DECOMPRESSION MICRODISCECTOMY N/A 07/01/2019   Procedure: LUMBAR LAMINECTOMY/DECOMPRESSION MICRODISCECTOMY 1 LEVEL, THORACIC SIX-SEVEN;  Surgeon: Consuella Lose, MD;  Location: [facility];  Service: Neurosurgery;  Laterality: N/A;  LUMBAR LAMINECTOMY/DECOMPRESSION MICRODISCECTOMY 1 LEVEL, THORACIC SIX-SEVEN    ORIF PATELLA     THORACIC DISCECTOMY N/A 07/03/2019   Procedure: Evacuation of Thoracic Hematoma;  Surgeon: Consuella Lose, MD;  Location: [facility];  Service: Neurosurgery;  Laterality: N/A;    There were no vitals filed for this visit.   Subjective Assessment - 03/06/21 1902     Subjective Pt reports he is doing  well - has been working on sit to stand transfers at home    Pertinent History T8 paraplegia, diabetes, HTN    How long can you stand comfortably? probably a couple minutes with RW.    How long can you walk comfortably? approx. 5-10 minutes with his son and RW.    Patient Stated Goals wants to be able to walk without any assistance    Currently in Pain? No/denies              Aquatic therapy at [facility] - pool temp 92 degrees   Patient seen for aquatic therapy today.  Treatment took place in water 3.6-4.5 feet deep depending upon activity.   Pt entered the pool via step negotiation with use of bil. hand rails with min assist with descension and mod to max assist for ascension - mod to max assist for lifting LLE onto 3rd- 7th steps for ascension due to LE's being fatigued.  Pt performed standing hamstring/heel cord stretch passively in long sitting position on bench at start of session - each knee pushed into extension with foot dorsiflexed - 30 sec hold x 2 reps each leg  Pt performed forward amb. 18' x 4 reps across width of pool holding onto barbell with CGA to min assist:  backwards amb. With min assist 18' x 4 reps with use of barbell   Pt performed sidestepping 18' x 4  reps across pool holding onto bar bell stabilized by PT  Pt performed static standing balance exercise - standing with feet shoulder width apart - pushing and pulling bar bell forward/back for trunk/core stabilization 10 reps  Pt performed step ups (forward) with use of aquatic step in 4.0' water depth - 10 reps each leg with min assist with 1 UE support on pool edge  Pt performed alternate tap ups 10 reps each foot to aquatic step in 4.5' water depth with 1 UE support on pool edge with CGA  Pt performed standing hip flexion, extension, abduction 10 reps each with use of 2.5# aquatic weight with bil. UE support on pool edge - CGA for safety   Supine exercise - use of flotation belt used with large  yellow noodle - bicyling LE's 30 reps;  hip abduction/adduction 30 reps  Simulated leg press with bil. Feet on pool wall - pushed pt toward wall - pt pushed against wall for leg press exercise (closed chain strengthening) 10 reps  Pt requires buoyancy of water for support and for joint offloading; standing activities are much safer in water with reduced fall risk compared to same activities performed on land.  Viscosity of water needed for resistance for strengthening bil. LE's.  Current of water provides perturbations for challenges with balance.                                 PT Short Term Goals - 03/06/21 1912       PT SHORT TERM GOAL #1   Title Pt will improve 3MWT distance with RW to at least 205' in order to demo improved gait efficiency. ALL STGS DUE 03/29/21    Baseline 190' on 02/09/21    Time 4    Period Weeks    Status New    Target Date 03/29/21      PT SHORT TERM GOAL #2   Title Pt will perform sit <> stand from lower mat table consistently with min guard in order to demo improved functional transfers/strength/    Baseline needs min guard/min A    Time 4    Period Weeks    Status New      PT SHORT TERM GOAL #4   Title In aquatic therapy, pt will be able to descend down pool steps with min guard and ascend out of pool steps with mod A with therapist in order to demo improved functional mobility.    Baseline will assess at aquatic session    Time 4    Period Weeks    Status On-going               PT Long Term Goals - 03/06/21 1913       PT LONG TERM GOAL #1   Title Pt and spouse will be independent with final HEP for BLE stretching and strengthening. ALL LTGS DUE 04/26/21    Baseline pt will continue to benefit from ongoing additions and review, pt reports performing at home    Time 8    Period Weeks    Status On-going    Target Date 04/26/21      PT LONG TERM GOAL #2   Title Pt will improve gait speed with RW and R AFO to at  least 1.05 ft/sec in order to demo improved household mobility.    Baseline 37.31 seconds = .87 ft/sec on 11/15/20 (pt more tired from aquatic  therapy yesterday); 34.19 seconds = .95 ft/sec on 03/01/21    Time 8    Period Weeks    Status Revised      PT LONG TERM GOAL #3   Title Pt will undergo 6MWT for gait endurance with RW with LTG updated.    Baseline 3MWT: 190' on 02/09/21    Time 8    Period Weeks    Status New      PT LONG TERM GOAL #4   Title Pt will stand at [facility] with min guard with no UE support with min guard for at least 40 seconds in order to demo improved balance for ADLs    Baseline 32.84 seconds on 09/12/20; 1 minute 4 seconds on 11/15/20, best time of 22 seconds on 03/01/21    Time 8    Period Weeks    Status Revised      PT LONG TERM GOAL #5   Title Pt will ambulate at least 345' over level surfaces with RW and min guard consistently in order to demo improved household mobility and improved endurance    Baseline 345' distance with min guard on 02/09/21, but assist does fluctuate based on fatigue    Time 8    Period Weeks    Status Revised                   Plan - 03/06/21 1914     Clinical Impression Statement Aquatic therapy session focused on LE strengthening, balance and gait training in 4'-4.5' with use of large barbell with min to CGA.  Occasional mod assist needed for recovery of LOB when pt accidentally stepped on his left foot with his right foot due to decreased coordination RLE.  Pt did well with sit to stand transfers from bench in pool at 3.6' water depth - used UE support on barbell with min assist to stabilize barbell.  Pt needed mod to max assist to lift LLE onto 3rd- 7th steps with ascension at end of session due to fatigue.  Cont with POC.    Personal Factors and Comorbidities Comorbidity 3+;Past/Current Experience    Comorbidities T8 paraplegia, diabetes, HTN    Examination-Activity Limitations Locomotion Level;Transfers;Stand     Examination-Participation Restrictions Community Activity;Yard Work    [employer] Evolving/Moderate complexity    Rehab Potential Good    PT Frequency 2x / week    PT Duration 12 weeks    PT Treatment/Interventions Aquatic Therapy;Manual techniques;Therapeutic exercise;Gait training;Neuromuscular re-education;Orthotic Fit/Training    PT Next Visit Plan continue to work on gait distance, SciFit for endurance/ROM/strengthening. step ups in parallel bars, side stepping. hip abductor strengthening, standing tolerance with decreased UE support, quadruped/tall kneeling for hip/core strengthening, LE stretching.    PT Home Exercise Plan Access Code: [email or web address]    Consulted and Agree with Plan of Care Patient             Patient will benefit from skilled therapeutic intervention in order to improve the following deficits and impairments:  Decreased balance, Decreased coordination, Decreased range of motion, Decreased strength, Decreased mobility, Postural dysfunction, Impaired tone, Decreased activity tolerance, Abnormal gait, Decreased endurance, Difficulty walking, Impaired sensation  Visit Diagnosis: Muscle weakness (generalized)  Unsteadiness on feet  Difficulty in walking, not elsewhere classified     Problem List Patient Active Problem List   Diagnosis Date Noted   Erectile dysfunction due to diseases classified elsewhere 03/02/2020   Wheelchair dependence 11/27/2019   Spasticity 08/03/2019   Paraplegia ([place]) 07/07/2019  Neurogenic bowel 07/07/2019   Neurogenic bladder 07/07/2019   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 [place], [name], [address] Phone: [phone]   Fax:  [phone]  Name: Timothy Davenport MRN: [id number] Date of Birth: [birth year]

## 2021-03-08 ENCOUNTER — Encounter: Payer: Self-pay | Admitting: Physical Medicine and Rehabilitation

## 2021-03-08 ENCOUNTER — Encounter
Payer: No Typology Code available for payment source | Attending: Physical Medicine and Rehabilitation | Admitting: Physical Medicine and Rehabilitation

## 2021-03-08 ENCOUNTER — Other Ambulatory Visit: Payer: Self-pay

## 2021-03-08 VITALS — BP 112/75 | HR 54 | Ht 71.0 in

## 2021-03-08 DIAGNOSIS — Z993 Dependence on wheelchair: Secondary | ICD-10-CM | POA: Insufficient documentation

## 2021-03-08 DIAGNOSIS — G8220 Paraplegia, unspecified: Secondary | ICD-10-CM | POA: Insufficient documentation

## 2021-03-08 DIAGNOSIS — R252 Cramp and spasm: Secondary | ICD-10-CM | POA: Diagnosis present

## 2021-03-08 DIAGNOSIS — K592 Neurogenic bowel, not elsewhere classified: Secondary | ICD-10-CM | POA: Insufficient documentation

## 2021-03-08 DIAGNOSIS — N319 Neuromuscular dysfunction of bladder, unspecified: Secondary | ICD-10-CM | POA: Insufficient documentation

## 2021-03-08 MED ORDER — DANTROLENE SODIUM 100 MG PO CAPS: 100.0000 mg | ORAL_CAPSULE | Freq: Three times a day (TID) | ORAL | 1 refills | Status: DC

## 2021-03-08 NOTE — Progress Notes (Signed)
Subjective:    Patient ID: Timothy Davenport, male    DOB: Oct 15, 1950, 71 y.o.   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 paraplegia- neurogenic bowel and bladder and  spasticity here for f/u on SCI and associated issues   Still having some muscle tightness- spasticity- but not as bad as used to be.   Taking Dantrolene 100 mg 2x/day.  Baclofen 2x/day is what he usually takes. He cut back to BID before last appt.    Hasn't had more weakness from dantrolene! Feeling good overall.  No issues right now.   [facility]- working on problems with w/c for 6 months - the power w/c AND the manual w/c- both need to be repaired.  Working on this.   Has called prosthetics in [place], [place]- to get vendor to put on w/c- needs to reach out to [employer] again- they haven't returned call.  Someone named Marya Amsler.  In [place].   Still walking some- around 300 ft at a time- and walks around home every day.  Using RW when walks.    Still cathing- q4 hours- cath throughout night.  Bowels going OK- on bowel program.   Hasn't used Viagra- hasn't tried it- has Rx for it.   Mood has been good.        Pain Inventory Average Pain 0 Pain Right Now 0 My pain is  No pain  LOCATION OF PAIN  No pain  BOWEL Number of stools per week: 5 Oral laxative use No  Enema or suppository use Yes    BLADDER Normal In and out cath, frequency Yes Able to self cath Yes  Bladder incontinence No  Frequent urination No  Leakage with coughing No  Difficulty starting stream No  Incomplete bladder emptying No    Mobility use a walker ability to climb steps?  no do you drive?  no use a wheelchair transfers alone  Function retired  Neuro/Psych trouble walking spasms depression anxiety  Prior Studies Any changes since last visit?  no  Physicians involved in your care Any changes since last visit?  no   Family History  Problem Relation Age of Onset    Hypertension Mother    Hypertension Father    Social History   Socioeconomic History   Marital status: Married    Spouse name: Not on file   Number of children: Not on file   Years of education: Not on file   Highest education level: Not on file  Occupational History   Not on file  Tobacco Use   Smoking status: Never   Smokeless tobacco: Never  Vaping Use   Vaping Use: Never used  Substance and Sexual Activity   Alcohol use: Yes    Comment: 2-3 drinks week   Drug use: Yes    Types: Marijuana    Comment: occasionally   Sexual activity: Not Currently  Other Topics Concern   Not on file  Social History Narrative   Not on file   Social Determinants of Health   Financial Resource Strain: Not on file  Food Insecurity: Not on file  Transportation Needs: Not on file  Physical Activity: Not on file  Stress: Not on file  Social Connections: Not on file   Past Surgical History:  Procedure Laterality Date   LUMBAR LAMINECTOMY/DECOMPRESSION MICRODISCECTOMY N/A 07/01/2019   Procedure: LUMBAR LAMINECTOMY/DECOMPRESSION MICRODISCECTOMY 1 LEVEL, THORACIC SIX-SEVEN;  Surgeon: Consuella Lose, MD;  Location: [facility];  Service: Neurosurgery;  Laterality: N/A;  LUMBAR LAMINECTOMY/DECOMPRESSION MICRODISCECTOMY 1 LEVEL, THORACIC SIX-SEVEN    ORIF PATELLA     THORACIC DISCECTOMY N/A 07/03/2019   Procedure: Evacuation of Thoracic Hematoma;  Surgeon: Consuella Lose, MD;  Location: [facility];  Service: Neurosurgery;  Laterality: N/A;   Past Medical History:  Diagnosis Date   Colon polyp    Diabetes mellitus without complication (HCC)    Diabetic neuropathy (HCC)    HTN (hypertension)    Patella fracture    Renal calculi    BP 112/75    Pulse (!) 54    Ht 5\' 11"  (1.803 m)    SpO2 97%    BMI 25.58 kg/m   Opioid Risk Score:   Fall Risk Score:  `1  Depression screen PHQ 2/9  Depression screen [facility] 2/9 11/30/2020 03/02/2020 11/27/2019 08/03/2019  Decreased Interest 0 1 1 3   Down,  Depressed, Hopeless 0 1 1 0  PHQ - 2 Score 0 2 2 3   Altered sleeping - - - 0  Tired, decreased energy - - - 0  Change in appetite - - - 1  Feeling bad or failure about yourself  - - - 0  Trouble concentrating - - - 0  Moving slowly or fidgety/restless - - - 0  Suicidal thoughts - - - 0  PHQ-9 Score - - - 4  Difficult doing work/chores - - - Not difficult at all     Review of Systems  Constitutional: Negative.   HENT: Negative.    Eyes: Negative.   Respiratory: Negative.    Cardiovascular: Negative.   Gastrointestinal: Negative.   Endocrine: Negative.   Genitourinary: Negative.   Musculoskeletal:  Positive for gait problem.  Skin: Negative.   Allergic/Immunologic: Negative.   Hematological: Negative.   Psychiatric/Behavioral:  Positive for dysphoric mood. The patient is nervous/anxious.       Objective:   Physical Exam Awake, alert, appropriate, in manual w/c; NAD MS: RLE- HF 3-/5; KE 4-/5; DF 3-/5 and PF 4-/5 LLE- HF 3/5; KE 4+/5, DF 3+/5 and PF 4/5  Neuro: MAS of 1+ to 2 in knees and hips B/L  No clonus B/L  Low energy and flat affect- which is not normal for him.       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 paraplegia- neurogenic bowel and bladder and  spasticity here for f/u on SCI and associated issues   Will increase Dantrolene to 100 mg capsule- 1 tab in AM and 2 tabs in evening- so 3 pills/day. Sent Rx to [facility]- won't need Botox based on exam.   2. Con't Baclofen 20 mg 2x/day- for spasticity ALONG with Dantrolene.   3. Maintain - I/o cathing q4 hours- working well- no UTIs.   4. Con't bowel program- working well- doing independently.   5. W/C via [place]- call and speak to [place] about w/c's.    6. Get CMP since started  Dantrolene- needs to be checked now and every 6 months- while on Dantrolene to look for LFT elevation which is unusual, but possible on Dantrolene.   7. F/U in 3 months-double  appt- SCI   I spent a total of 23 minutes on total visit- discussing spasticity and options and educating on Dantrolene.

## 2021-03-08 NOTE — Patient Instructions (Addendum)
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 paraplegia- neurogenic bowel and bladder and  spasticity here for f/u on SCI and associated issues   Will increase Dantrolene to 100 mg capsule- 1 tab in AM and 2 tabs in evening- so 3 pills/day. Sent Rx to [facility]- won't need Botox based on exam today!  2. Con't Baclofen 20 mg 2x/day- for spasticity ALONG with Dantrolene.   3. Maintain - I/o cathing q4 hours- working well- no UTIs.   4. Con't bowel program- working well- doing independently.   5. W/C via [place]- call and speak to [place] about w/c's.    6. Get CMP since started  Dantrolene- needs to be checked now and every 6 months- while on Dantrolene to look for LFT elevation which is unusual, but possible on Dantrolene.   7. F/U in 3 months-double appt- SCI

## 2021-03-09 ENCOUNTER — Telehealth: Payer: Self-pay | Admitting: Physical Medicine and Rehabilitation

## 2021-03-09 ENCOUNTER — Ambulatory Visit: Payer: No Typology Code available for payment source | Admitting: Physical Therapy

## 2021-03-09 ENCOUNTER — Encounter: Payer: Self-pay | Admitting: Physical Therapy

## 2021-03-09 DIAGNOSIS — M6281 Muscle weakness (generalized): Secondary | ICD-10-CM

## 2021-03-09 DIAGNOSIS — R293 Abnormal posture: Secondary | ICD-10-CM

## 2021-03-09 DIAGNOSIS — R29818 Other symptoms and signs involving the nervous system: Secondary | ICD-10-CM

## 2021-03-09 DIAGNOSIS — R262 Difficulty in walking, not elsewhere classified: Secondary | ICD-10-CM

## 2021-03-09 DIAGNOSIS — R2681 Unsteadiness on feet: Secondary | ICD-10-CM

## 2021-03-09 LAB — COMPREHENSIVE METABOLIC PANEL
ALT: 8 IU/L (ref 0–44)
AST: 16 IU/L (ref 0–40)
Albumin/Globulin Ratio: 1 — ABNORMAL LOW (ref 1.2–2.2)
Albumin: 3.9 g/dL (ref 3.7–4.7)
Alkaline Phosphatase: 82 IU/L (ref 44–121)
BUN/Creatinine Ratio: 13 (ref 10–24)
BUN: 17 mg/dL (ref 8–27)
Bilirubin Total: 0.2 mg/dL (ref 0.0–1.2)
CO2: 24 mmol/L (ref 20–29)
Calcium: 9.9 mg/dL (ref 8.6–10.2)
Chloride: 102 mmol/L (ref 96–106)
Creatinine, Ser: 1.34 mg/dL — ABNORMAL HIGH (ref 0.76–1.27)
Globulin, Total: 4 g/dL (ref 1.5–4.5)
Glucose: 100 mg/dL — ABNORMAL HIGH (ref 70–99)
Potassium: 4 mmol/L (ref 3.5–5.2)
Sodium: 137 mmol/L (ref 134–144)
Total Protein: 7.9 g/dL (ref 6.0–8.5)
eGFR: 56 mL/min/{1.73_m2} — ABNORMAL LOW (ref >59)

## 2021-03-09 NOTE — Telephone Encounter (Signed)
Called pt about CMP- showed Cr of 1.34- BUN OK- LFTs OK- and otherwise looks good- - suggest drinking more water and then rechecking in 2-4 weeks with PCP- if still elevated, needs more work up by PCP- nothing I have him on should affect kidney function.   Informed pt and wife by phone- they repeated back the plan-

## 2021-03-09 NOTE — Therapy (Signed)
[facility] [address] [place], [name], [address] Phone: [phone]   Fax:  [phone]  Physical Therapy Treatment  Patient Details  Name: Timothy Davenport MRN: [id number] Date of Birth: [birth year] Referring Provider (PT): Juel Burrow    Encounter Date: 03/09/2021   PT End of Session - 03/09/21 1106     Visit Number 74    Number of Visits 76    Date for PT Re-Evaluation 29/51/88   per re-cert on 06/05/58   Authorization Type VA - 15 additional visits through 12/28/20, additional auth for 15 visits.    Authorization - Visit Number 12    Authorization - Number of Visits 15    Progress Note Due on Visit 57    PT Start Time 1102    PT Stop Time 1145    PT Time Calculation (min) 43 min    Equipment Utilized During Treatment Gait belt;Other (comment)   used large barbell and pool noodle, aquatic weights   Activity Tolerance Patient tolerated treatment well    Behavior During Therapy WFL for tasks assessed/performed             Past Medical History:  Diagnosis Date   Colon polyp    Diabetes mellitus without complication (HCC)    Diabetic neuropathy ([place])    HTN (hypertension)    Patella fracture    Renal calculi     Past Surgical History:  Procedure Laterality Date   LUMBAR LAMINECTOMY/DECOMPRESSION MICRODISCECTOMY N/A 07/01/2019   Procedure: LUMBAR LAMINECTOMY/DECOMPRESSION MICRODISCECTOMY 1 LEVEL, THORACIC SIX-SEVEN;  Surgeon: Consuella Lose, MD;  Location: [facility];  Service: Neurosurgery;  Laterality: N/A;  LUMBAR LAMINECTOMY/DECOMPRESSION MICRODISCECTOMY 1 LEVEL, THORACIC SIX-SEVEN    ORIF PATELLA     THORACIC DISCECTOMY N/A 07/03/2019   Procedure: Evacuation of Thoracic Hematoma;  Surgeon: Consuella Lose, MD;  Location: [facility];  Service: Neurosurgery;  Laterality: N/A;    There were no vitals filed for this visit.   Subjective Assessment - 03/09/21 1104     Subjective No new complaints. Had  a good visit with Dr. Arma Heading yesterday- starting on a new med for spasticity in additon to the Baclofen instead of more Botox. No falls or pain to report.    Pertinent History T8 paraplegia, diabetes, HTN    How long can you stand comfortably? probably a couple minutes with RW.    How long can you walk comfortably? approx. 5-10 minutes with his son and RW.    Patient Stated Goals wants to be able to walk without any assistance    Currently in Pain? No/denies    Pain Score 0-No pain                      [facility] Adult PT Treatment/Exercise - 03/09/21 1107       Transfers   Transfers Sit to Stand;Stand to Sit    Sit to Stand 4: Min guard;With upper extremity assist;From bed    Sit to Stand Details (indicate cue type and reason) increased time/effort needed, however no physical assistance needed    Stand to Sit 4: Min guard;With upper extremity assist;To chair/3-in-1    Stand to Sit Details reminder cues to reach back    Lateral/Scoot Transfers 6: Modified independent (Device/Increase time)    Lateral/Scoot Transfer Details (indicate cue type and reason) wheelchair<>mat table    Comments from lower mat to/from RW x 2 reps.      Ambulation/Gait   Ambulation/Gait Yes  Ambulation/Gait Assistance 4: Min guard    Ambulation/Gait Assistance Details reminder cues for tall posture, hip extension, step length and step height. occasional crossing of right LE with swing phase that pt was able to self correct with gait.    Ambulation Distance (Feet) 115 Feet    Assistive device Rolling walker    Gait Pattern Decreased weight shift to right;Left flexed knee in stance;Right flexed knee in stance;Narrow base of support;Decreased step length - right;Decreased step length - left;Decreased stance time - right;Decreased hip/knee flexion - right;Step-through pattern;Poor foot clearance - right    Ambulation Surface Level;Indoor      Neuro Re-ed    Neuro Re-ed Details  on mat table:  prone<>quadruped with min assist- in quadruped working on cat<>camel with mod cues needed on technique, min assist for balance. prone>tall kneeling wiht UE support on Kaye bench>prone>sitting edge of mat- in tall kneeling working toward tall posture, then mini squats for 10 reps with emphasis on return to tall kneeling. then back in tall posture working on alternating UE raises, then alternating moving knees out/back in. min guard to min assist for balance/weight shifting and for maintaining tall posture.      Knee/Hip Exercises: Stretches   Passive Hamstring Stretch Both;30 seconds;3 reps    Passive Hamstring Stretch Limitations seated at edge of mat with LE propped on PTA knee with over pressure at knee and foot into DF.      Knee/Hip Exercises: Prone   Hamstring Curl 1 set;10 reps;Limitations    Hamstring Curl Limitations pt able to initiate muscle contractions, assist needed to complete the action    Hip Extension AAROM;Strengthening;Both;1 set;10 reps;Limitations    Hip Extension Limitations glut kick backs with assist to maintain positioning of knee and for clearance of mat table with lifting                       PT Short Term Goals - 03/06/21 1912       PT SHORT TERM GOAL #1   Title Pt will improve 3MWT distance with RW to at least 205' in order to demo improved gait efficiency. ALL STGS DUE 03/29/21    Baseline 190' on 02/09/21    Time 4    Period Weeks    Status New    Target Date 03/29/21      PT SHORT TERM GOAL #2   Title Pt will perform sit <> stand from lower mat table consistently with min guard in order to demo improved functional transfers/strength/    Baseline needs min guard/min A    Time 4    Period Weeks    Status New      PT SHORT TERM GOAL #4   Title In aquatic therapy, pt will be able to descend down pool steps with min guard and ascend out of pool steps with mod A with therapist in order to demo improved functional mobility.    Baseline will  assess at aquatic session    Time 4    Period Weeks    Status On-going               PT Long Term Goals - 03/06/21 1913       PT LONG TERM GOAL #1   Title Pt and spouse will be independent with final HEP for BLE stretching and strengthening. ALL LTGS DUE 04/26/21    Baseline pt will continue to benefit from ongoing additions and review, pt reports performing at  home    Time 8    Period Weeks    Status On-going    Target Date 04/26/21      PT LONG TERM GOAL #2   Title Pt will improve gait speed with RW and R AFO to at least 1.05 ft/sec in order to demo improved household mobility.    Baseline 37.31 seconds = .87 ft/sec on 11/15/20 (pt more tired from aquatic therapy yesterday); 34.19 seconds = .95 ft/sec on 03/01/21    Time 8    Period Weeks    Status Revised      PT LONG TERM GOAL #3   Title Pt will undergo 6MWT for gait endurance with RW with LTG updated.    Baseline 3MWT: 190' on 02/09/21    Time 8    Period Weeks    Status New      PT LONG TERM GOAL #4   Title Pt will stand at [facility] with min guard with no UE support with min guard for at least 40 seconds in order to demo improved balance for ADLs    Baseline 32.84 seconds on 09/12/20; 1 minute 4 seconds on 11/15/20, best time of 22 seconds on 03/01/21    Time 8    Period Weeks    Status Revised      PT LONG TERM GOAL #5   Title Pt will ambulate at least 345' over level surfaces with RW and min guard consistently in order to demo improved household mobility and improved endurance    Baseline 345' distance with min guard on 02/09/21, but assist does fluctuate based on fatigue    Time 8    Period Weeks    Status Revised                   Plan - 03/09/21 1107     Clinical Impression Statement Today's skilled session continued to focus on transfers, giat with RW and core/LE strengthening with no issues noted or reported in session. The pt is making steady progress toward goals and should benefit from continued PT  to progress toward unmet goals.    Personal Factors and Comorbidities Comorbidity 3+;Past/Current Experience    Comorbidities T8 paraplegia, diabetes, HTN    Examination-Activity Limitations Locomotion Level;Transfers;Stand    Examination-Participation Restrictions Community Activity;Yard Work    [employer] Evolving/Moderate complexity    Rehab Potential Good    PT Frequency 2x / week    PT Duration 12 weeks    PT Treatment/Interventions Aquatic Therapy;Manual techniques;Therapeutic exercise;Gait training;Neuromuscular re-education;Orthotic Fit/Training    PT Next Visit Plan continue to work on gait distance, SciFit for endurance/ROM/strengthening. step ups in parallel bars, side stepping. hip abductor strengthening, standing tolerance with decreased UE support, quadruped/tall kneeling for hip/core strengthening, LE stretching.    PT Home Exercise Plan Access Code: [email or web address]    Consulted and Agree with Plan of Care Patient             Patient will benefit from skilled therapeutic intervention in order to improve the following deficits and impairments:  Decreased balance, Decreased coordination, Decreased range of motion, Decreased strength, Decreased mobility, Postural dysfunction, Impaired tone, Decreased activity tolerance, Abnormal gait, Decreased endurance, Difficulty walking, Impaired sensation  Visit Diagnosis: Muscle weakness (generalized)  Unsteadiness on feet  Difficulty in walking, not elsewhere classified  Abnormal posture  Other symptoms and signs involving the nervous system     Problem List Patient Active Problem List   Diagnosis Date Noted  Erectile dysfunction due to diseases classified elsewhere 03/02/2020   Wheelchair dependence 11/27/2019   Spasticity 08/03/2019   Paraplegia ([place]) 07/07/2019   Neurogenic bowel 07/07/2019   Neurogenic bladder 07/07/2019   Thoracic myelopathy 06/30/2019    Willow Ora, PTA, [facility] Outpatient Neuro  [facility] [address], [place], [place] [address] [phone] 03/09/21, 10:35 PM   Name: KAMDYN COLBORN MRN: [id number] Date of Birth: Sep 24, 1950

## 2021-03-13 ENCOUNTER — Other Ambulatory Visit: Payer: Self-pay

## 2021-03-13 ENCOUNTER — Ambulatory Visit: Payer: No Typology Code available for payment source | Admitting: Physical Therapy

## 2021-03-13 DIAGNOSIS — M6281 Muscle weakness (generalized): Secondary | ICD-10-CM | POA: Diagnosis not present

## 2021-03-13 DIAGNOSIS — R29818 Other symptoms and signs involving the nervous system: Secondary | ICD-10-CM

## 2021-03-13 DIAGNOSIS — R2681 Unsteadiness on feet: Secondary | ICD-10-CM

## 2021-03-13 DIAGNOSIS — R293 Abnormal posture: Secondary | ICD-10-CM

## 2021-03-13 DIAGNOSIS — R262 Difficulty in walking, not elsewhere classified: Secondary | ICD-10-CM

## 2021-03-14 ENCOUNTER — Encounter: Payer: Self-pay | Admitting: Physical Therapy

## 2021-03-14 NOTE — Therapy (Signed)
[facility] [address] [place], [name], [address] Phone: [phone]   Fax:  [phone]  Physical Therapy Treatment  Patient Details  Name: Timothy Davenport MRN: [id number] Date of Birth: 1950-09-19 Referring Provider (PT): Juel Burrow    Encounter Date: 03/13/2021   PT End of Session - 03/13/21 1655     Visit Number 75    Number of Visits 16    Date for PT Re-Evaluation 09/73/53   per re-cert on 2/99/24   Authorization Type VA - 15 additional visits through 12/28/20, additional auth for 15 visits.    Authorization - Visit Number 13    Authorization - Number of Visits 15    Progress Note Due on Visit 39    PT Start Time 1402    PT Stop Time 1443    PT Time Calculation (min) 41 min    Equipment Utilized During Treatment Other (comment);Gait belt   used large barbell, aquatic weights/step, waist belt, pool noodle, aquatic neck pillow   Activity Tolerance Patient tolerated treatment well    Behavior During Therapy WFL for tasks assessed/performed             Past Medical History:  Diagnosis Date   Colon polyp    Diabetes mellitus without complication (HCC)    Diabetic neuropathy (HCC)    HTN (hypertension)    Patella fracture    Renal calculi     Past Surgical History:  Procedure Laterality Date   LUMBAR LAMINECTOMY/DECOMPRESSION MICRODISCECTOMY N/A 07/01/2019   Procedure: LUMBAR LAMINECTOMY/DECOMPRESSION MICRODISCECTOMY 1 LEVEL, THORACIC SIX-SEVEN;  Surgeon: Consuella Lose, MD;  Location: [facility];  Service: Neurosurgery;  Laterality: N/A;  LUMBAR LAMINECTOMY/DECOMPRESSION MICRODISCECTOMY 1 LEVEL, THORACIC SIX-SEVEN    ORIF PATELLA     THORACIC DISCECTOMY N/A 07/03/2019   Procedure: Evacuation of Thoracic Hematoma;  Surgeon: Consuella Lose, MD;  Location: [facility];  Service: Neurosurgery;  Laterality: N/A;    There were no vitals filed for this visit.   Subjective Assessment - 03/13/21 1655      Subjective No new complaints. A little sore after last session.    Pertinent History T8 paraplegia, diabetes, HTN    How long can you stand comfortably? probably a couple minutes with RW.    How long can you walk comfortably? approx. 5-10 minutes with his son and RW.    Patient Stated Goals wants to be able to walk without any assistance    Currently in Pain? No/denies    Pain Score 0-No pain             Aquatic therapy at [facility] - pool temp 90 degrees   Patient seen for aquatic therapy today.  Treatment took place in water 3.5-4.5 feet deep depending upon activity.  Pt entered and exited the pool with bil rails and step to gait pattern. Assistance needed for left LE to ascend to next step with exiting the pool for the last 3 steps.   Once in water pt used wall to walk around toward where bench was.   At bench In long sitting Passive stretching of heel cords and hamstrings for 30 sec's x 2-3 reps each bil LE's.  Seated at edge of bench With heel propped on aquatic step for active hamstring stretch. Overpressure at knee to assist with knee extension. 30 seconds x 3 reps on each side, With feet on step:  in parallel sit<>stands x 10 reps, then with feet in staggered stance for sit<>stands x 10  reps each foot forward with use of large bar bell to assist with stability in standing, min assist from PTA.   Seated with feet on step with 2# ankle weights on bil LE's: Long arc quads with emphasis on slow, controlled movements x 15 reps each side Alternating marching for 15 reps on each side With LE's extended out floating in water for hip abd/add x 15 reps With LE's extended out for scissor kicks for 10 reps Then had pt performed Flutter kicks with emphasis on trying to keep feet/knees together- lifting knees to chest with knee flexion<>kicking legs out for knee extension<>back into knee flexion<>lowering feet back to step for 10 reps.   With large bar bell gait into ~4.3-4.5 foot water  depth. Up to min assist for balance.  Forward gait across pool/back for 4 laps. Working on tall posture and increased step length with heel>toe step progression Backward gait across pool/back for 4 laps with emphasis on tall posture and step length.  Side stepping left<>right across pool/back for 4 laps with emphasis on posture and step length.   With use of aquatic belt around waist, aquatic pillow at head and pool noodle under arms/across back to allow pt to float in water with PTA support at upper trunk/head.  Bicycling around pool for ~5 minutes with cues to keep LE's moving in a circle motion, PTA assisting with direction in pool With bil feet against pool wall for modified leg press with PTA providing resistance, had pt work on trying to push hard enough to bring legs off wall with each rep for 10 reps.    Pt requires buoyancy of water for support for reduced fall risk with gait training and balance exercises with minimal UE support; exercises able to be performed safely in water without the risk of fall compared to those same exercises performed on land;  viscosity of water needed for resistance for strengthening.  Current of water provides perturbations for challenging static & dynamic standing balance.             PT Short Term Goals - 03/06/21 1912       PT SHORT TERM GOAL #1   Title Pt will improve 3MWT distance with RW to at least 205' in order to demo improved gait efficiency. ALL STGS DUE 03/29/21    Baseline 190' on 02/09/21    Time 4    Period Weeks    Status New    Target Date 03/29/21      PT SHORT TERM GOAL #2   Title Pt will perform sit <> stand from lower mat table consistently with min guard in order to demo improved functional transfers/strength/    Baseline needs min guard/min A    Time 4    Period Weeks    Status New      PT SHORT TERM GOAL #4   Title In aquatic therapy, pt will be able to descend down pool steps with min guard and ascend out of pool steps  with mod A with therapist in order to demo improved functional mobility.    Baseline will assess at aquatic session    Time 4    Period Weeks    Status On-going               PT Long Term Goals - 03/06/21 1913       PT LONG TERM GOAL #1   Title Pt and spouse will be independent with final HEP for BLE stretching and  strengthening. ALL LTGS DUE 04/26/21    Baseline pt will continue to benefit from ongoing additions and review, pt reports performing at home    Time 8    Period Weeks    Status On-going    Target Date 04/26/21      PT LONG TERM GOAL #2   Title Pt will improve gait speed with RW and R AFO to at least 1.05 ft/sec in order to demo improved household mobility.    Baseline 37.31 seconds = .87 ft/sec on 11/15/20 (pt more tired from aquatic therapy yesterday); 34.19 seconds = .95 ft/sec on 03/01/21    Time 8    Period Weeks    Status Revised      PT LONG TERM GOAL #3   Title Pt will undergo 6MWT for gait endurance with RW with LTG updated.    Baseline 3MWT: 190' on 02/09/21    Time 8    Period Weeks    Status New      PT LONG TERM GOAL #4   Title Pt will stand at [facility] with min guard with no UE support with min guard for at least 40 seconds in order to demo improved balance for ADLs    Baseline 32.84 seconds on 09/12/20; 1 minute 4 seconds on 11/15/20, best time of 22 seconds on 03/01/21    Time 8    Period Weeks    Status Revised      PT LONG TERM GOAL #5   Title Pt will ambulate at least 345' over level surfaces with RW and min guard consistently in order to demo improved household mobility and improved endurance    Baseline 345' distance with min guard on 02/09/21, but assist does fluctuate based on fatigue    Time 8    Period Weeks    Status Revised                   Plan - 03/13/21 1655     Clinical Impression Statement Today's skilled session continued to focus on stretching, strengthening and gait/balance in the aquatic setting. No issues noted or  reported in session. The pt is making steady progress toward goals and should benefit from continued PT to progress toward unmet goals.    Personal Factors and Comorbidities Comorbidity 3+;Past/Current Experience    Comorbidities T8 paraplegia, diabetes, HTN    Examination-Activity Limitations Locomotion Level;Transfers;Stand    Examination-Participation Restrictions Community Activity;Yard Work    [employer] officer Evolving/Moderate complexity    Rehab Potential Good    PT Frequency 2x / week    PT Duration 12 weeks    PT Treatment/Interventions Aquatic Therapy;Manual techniques;Therapeutic exercise;Gait training;Neuromuscular re-education;Orthotic Fit/Training    PT Next Visit Plan continue to work on gait distance, SciFit for endurance/ROM/strengthening. step ups in parallel bars, side stepping. hip abductor strengthening, standing tolerance with decreased UE support, quadruped/tall kneeling for hip/core strengthening, LE stretching.    PT Home Exercise Plan Access Code: [email or web address]    Consulted and Agree with Plan of Care Patient             Patient will benefit from skilled therapeutic intervention in order to improve the following deficits and impairments:  Decreased balance, Decreased coordination, Decreased range of motion, Decreased strength, Decreased mobility, Postural dysfunction, Impaired tone, Decreased activity tolerance, Abnormal gait, Decreased endurance, Difficulty walking, Impaired sensation  Visit Diagnosis: Muscle weakness (generalized)  Unsteadiness on feet  Difficulty in walking, not elsewhere classified  Abnormal posture  Other symptoms  and signs involving the nervous system     Problem List Patient Active Problem List   Diagnosis Date Noted   Erectile dysfunction due to diseases classified elsewhere 03/02/2020   Wheelchair dependence 11/27/2019   Spasticity 08/03/2019   Paraplegia ([place]) 07/07/2019   Neurogenic bowel 07/07/2019    Neurogenic bladder 07/07/2019   Thoracic myelopathy 06/30/2019   Willow Ora, PTA, [facility] Outpatient Neuro [facility] [address], [place], [place] [address] [phone] 03/14/21, 8:33 PM   Name: LEWIS KEATS MRN: [id number] Date of Birth: 06/08/1950

## 2021-03-15 ENCOUNTER — Other Ambulatory Visit: Payer: Self-pay

## 2021-03-15 ENCOUNTER — Encounter: Payer: Self-pay | Admitting: Physical Therapy

## 2021-03-15 ENCOUNTER — Ambulatory Visit: Payer: No Typology Code available for payment source | Admitting: Physical Therapy

## 2021-03-15 DIAGNOSIS — R293 Abnormal posture: Secondary | ICD-10-CM

## 2021-03-15 DIAGNOSIS — M6281 Muscle weakness (generalized): Secondary | ICD-10-CM

## 2021-03-15 DIAGNOSIS — R2681 Unsteadiness on feet: Secondary | ICD-10-CM

## 2021-03-15 DIAGNOSIS — R262 Difficulty in walking, not elsewhere classified: Secondary | ICD-10-CM

## 2021-03-15 NOTE — Therapy (Signed)
[facility] [address] [place], [name], [address] Phone: [phone]   Fax:  [phone]  Physical Therapy Treatment  Patient Details  Name: Timothy Davenport MRN: [id number] Date of Birth: 21-Feb-1950 Referring Provider (PT): Juel Burrow    Encounter Date: 03/15/2021   PT End of Session - 03/15/21 1103     Visit Number 3    Number of Visits 62    Date for PT Re-Evaluation 99/83/38   per re-cert on 2/50/53   Authorization Type VA - 15 additional visits through 12/28/20, additional auth for 15 visits.    Authorization - Visit Number 14    Authorization - Number of Visits 15    Progress Note Due on Visit 5    PT Start Time 1102    PT Stop Time 1143    PT Time Calculation (min) 41 min    Equipment Utilized During Treatment Gait belt    Activity Tolerance Patient tolerated treatment well    Behavior During Therapy WFL for tasks assessed/performed             Past Medical History:  Diagnosis Date   Colon polyp    Diabetes mellitus without complication ([place])    Diabetic neuropathy ([place])    HTN (hypertension)    Patella fracture    Renal calculi     Past Surgical History:  Procedure Laterality Date   LUMBAR LAMINECTOMY/DECOMPRESSION MICRODISCECTOMY N/A 07/01/2019   Procedure: LUMBAR LAMINECTOMY/DECOMPRESSION MICRODISCECTOMY 1 LEVEL, THORACIC SIX-SEVEN;  Surgeon: Consuella Lose, MD;  Location: [facility];  Service: Neurosurgery;  Laterality: N/A;  LUMBAR LAMINECTOMY/DECOMPRESSION MICRODISCECTOMY 1 LEVEL, THORACIC SIX-SEVEN    ORIF PATELLA     THORACIC DISCECTOMY N/A 07/03/2019   Procedure: Evacuation of Thoracic Hematoma;  Surgeon: Consuella Lose, MD;  Location: [facility];  Service: Neurosurgery;  Laterality: N/A;    There were no vitals filed for this visit.   Subjective Assessment - 03/15/21 1104     Subjective Had a slightly abnormal kidney value when seeing Dr. Dagoberto Ligas recently. Following up with his  PCP about it on Monday. Feels like his legs are getting stronger.    Pertinent History T8 paraplegia, diabetes, HTN    How long can you stand comfortably? probably a couple minutes with RW.    How long can you walk comfortably? approx. 5-10 minutes with his son and RW.    Patient Stated Goals wants to be able to walk without any assistance    Currently in Pain? No/denies                               [facility] Adult PT Treatment/Exercise - 03/15/21 0001       Transfers   Transfers Sit to Stand;Stand to Sit    Sit to Stand 4: Min guard;With upper extremity assist;From bed    Sit to Stand Details (indicate cue type and reason) From lower mat table, therapist helping to steady RW when standing    Stand to Sit 4: Min guard;With upper extremity assist;To chair/3-in-1    Number of Reps 1 set;Other reps (comment)   6 reps     Ambulation/Gait   Ambulation/Gait Yes    Ambulation/Gait Assistance 4: Min guard    Ambulation Distance (Feet) 115 Feet   x2   Assistive device Rolling walker    Gait Pattern Decreased weight shift to right;Left flexed knee in stance;Right flexed knee in stance;Narrow base of  support;Decreased step length - right;Decreased step length - left;Decreased stance time - right;Decreased hip/knee flexion - right;Step-through pattern;Poor foot clearance - right    Ambulation Surface Level;Indoor      Neuro Re-ed    Neuro Re-ed Details  On mat table; went from prone to quadruped with min A at pt's legs to bring them more into knee flexion, once in position performed quadruped <> child's pose x8 reps with 10-15 second holds in child's pose for hip stretch. Transitioned to tall kneeling with kaye bench; x10 reps mini squats with cues for glute activation/hip extension, posture. x7 reps each side working on weight shift and reaching across body to tap cone, needing single UE support for balance, min A for balance and cued for posture throughout. Pt needing to take one  rest break in between on each side.      Knee/Hip Exercises: Aerobic   Stepper Scifit with BLE only at level 2.5 for 6 minutes strengthening, ROM, endurance. Cues for full ROM. Pt transferred laterally to SciFit seat from w/c with supervision.                     PT Education - 03/15/21 1147     Education Details Scheduling additional vists (will wait a few weeks in order to get [place]), discussed plan about wrapping up after this remaining cert period and transition more to community program/HEP at home with pt in agreement with plan. Discussed having pt's wife come to future pool sessions with pt to learn about an aquatic HEP that she can perform with pt after therapy if they decide to get a gym membership. Also discussed use of SciFit at the gym but would just need wife there for supervision.    Person(s) Educated Patient    Methods Explanation    Comprehension Verbalized understanding              PT Short Term Goals - 03/06/21 1912       PT SHORT TERM GOAL #1   Title Pt will improve 3MWT distance with RW to at least 205' in order to demo improved gait efficiency. ALL STGS DUE 03/29/21    Baseline 190' on 02/09/21    Time 4    Period Weeks    Status New    Target Date 03/29/21      PT SHORT TERM GOAL #2   Title Pt will perform sit <> stand from lower mat table consistently with min guard in order to demo improved functional transfers/strength/    Baseline needs min guard/min A    Time 4    Period Weeks    Status New      PT SHORT TERM GOAL #4   Title In aquatic therapy, pt will be able to descend down pool steps with min guard and ascend out of pool steps with mod A with therapist in order to demo improved functional mobility.    Baseline will assess at aquatic session    Time 4    Period Weeks    Status On-going               PT Long Term Goals - 03/06/21 1913       PT LONG TERM GOAL #1   Title Pt and spouse will be independent with final HEP for  BLE stretching and strengthening. ALL LTGS DUE 04/26/21    Baseline pt will continue to benefit from ongoing additions and review, pt reports performing at  home    Time 8    Period Weeks    Status On-going    Target Date 04/26/21      PT LONG TERM GOAL #2   Title Pt will improve gait speed with RW and R AFO to at least 1.05 ft/sec in order to demo improved household mobility.    Baseline 37.31 seconds = .87 ft/sec on 11/15/20 (pt more tired from aquatic therapy yesterday); 34.19 seconds = .95 ft/sec on 03/01/21    Time 8    Period Weeks    Status Revised      PT LONG TERM GOAL #3   Title Pt will undergo 6MWT for gait endurance with RW with LTG updated.    Baseline 3MWT: 190' on 02/09/21    Time 8    Period Weeks    Status New      PT LONG TERM GOAL #4   Title Pt will stand at [facility] with min guard with no UE support with min guard for at least 40 seconds in order to demo improved balance for ADLs    Baseline 32.84 seconds on 09/12/20; 1 minute 4 seconds on 11/15/20, best time of 22 seconds on 03/01/21    Time 8    Period Weeks    Status Revised      PT LONG TERM GOAL #5   Title Pt will ambulate at least 345' over level surfaces with RW and min guard consistently in order to demo improved household mobility and improved endurance    Baseline 345' distance with min guard on 02/09/21, but assist does fluctuate based on fatigue    Time 8    Period Weeks    Status Revised                   Plan - 03/15/21 1205     Clinical Impression Statement Today's skilled session focused on BLE strengthening, gait, and activities in tall kneeling/quadruped for strengthening and weight shifting. Pt tolerated session well. Did need intermittent rest breaks in tall kneeling due to fatigue.  Will continue to progress towards LTGs.    Personal Factors and Comorbidities Comorbidity 3+;Past/Current Experience    Comorbidities T8 paraplegia, diabetes, HTN    Examination-Activity Limitations Locomotion  Level;Transfers;Stand    Examination-Participation Restrictions Community Activity;Yard Work    [employer]chant [employer] officer Evolving/Moderate complexity    Rehab Potential Good    PT Frequency 2x / week    PT Duration 12 weeks    PT Treatment/Interventions Aquatic Therapy;Manual techniques;Therapeutic exercise;Gait training;Neuromuscular re-education;Orthotic Fit/Training    PT Next Visit Plan waiting on additional [place]. continue to work on gait distance, SciFit for endurance/ROM/strengthening. step ups in parallel bars, side stepping. hip abductor strengthening, standing tolerance with decreased UE support, quadruped/tall kneeling for hip/core strengthening.    PT Home Exercise Plan Access Code: [email or web address]    Consulted and Agree with Plan of Care Patient             Patient will benefit from skilled therapeutic intervention in order to improve the following deficits and impairments:  Decreased balance, Decreased coordination, Decreased range of motion, Decreased strength, Decreased mobility, Postural dysfunction, Impaired tone, Decreased activity tolerance, Abnormal gait, Decreased endurance, Difficulty walking, Impaired sensation  Visit Diagnosis: Muscle weakness (generalized)  Unsteadiness on feet  Difficulty in walking, not elsewhere classified  Abnormal posture     Problem List Patient Active Problem List   Diagnosis Date Noted   Erectile dysfunction due to diseases classified elsewhere  03/02/2020   Wheelchair dependence 11/27/2019   Spasticity 08/03/2019   Paraplegia ([place]) 07/07/2019   Neurogenic bowel 07/07/2019   Neurogenic bladder 07/07/2019   Thoracic myelopathy 06/30/2019    Arliss Journey, PT, DPT  03/15/2021, 12:08 PM  [facility] [address] [place], [name], [address] Phone: [phone]   Fax:  [phone]  Name: MYCHAEL SMOCK MRN: [id number] Date of Birth: 1950/03/09

## 2021-03-20 ENCOUNTER — Encounter: Payer: Self-pay | Admitting: Physical Therapy

## 2021-03-20 ENCOUNTER — Other Ambulatory Visit: Payer: Self-pay

## 2021-03-20 ENCOUNTER — Ambulatory Visit: Payer: No Typology Code available for payment source | Admitting: Physical Therapy

## 2021-03-20 DIAGNOSIS — R262 Difficulty in walking, not elsewhere classified: Secondary | ICD-10-CM

## 2021-03-20 DIAGNOSIS — R2681 Unsteadiness on feet: Secondary | ICD-10-CM

## 2021-03-20 DIAGNOSIS — M6281 Muscle weakness (generalized): Secondary | ICD-10-CM

## 2021-03-20 DIAGNOSIS — R293 Abnormal posture: Secondary | ICD-10-CM

## 2021-03-20 NOTE — Therapy (Signed)
[facility] [address] [place], [name], [address] Phone: [phone]   Fax:  [phone]  Physical Therapy Treatment  Patient Details  Name: Timothy Davenport MRN: [id number] Date of Birth: [birth year] Referring Provider (PT): Juel Burrow    Encounter Date: 03/20/2021   PT End of Session - 03/20/21 1708     Visit Number 69    Number of Visits 65    Date for PT Re-Evaluation 25/85/27   per re-cert on 7/82/42   Authorization Type VA - 15 additional visits through 12/28/20, additional auth for 15 visits.    Authorization - Visit Number 15    Authorization - Number of Visits 15    Progress Note Due on Visit 71    PT Start Time 1400    PT Stop Time 1444    PT Time Calculation (min) 44 min    Equipment Utilized During Treatment Gait belt    Activity Tolerance Patient tolerated treatment well    Behavior During Therapy WFL for tasks assessed/performed             Past Medical History:  Diagnosis Date   Colon polyp    Diabetes mellitus without complication ([place])    Diabetic neuropathy ([place])    HTN (hypertension)    Patella fracture    Renal calculi     Past Surgical History:  Procedure Laterality Date   LUMBAR LAMINECTOMY/DECOMPRESSION MICRODISCECTOMY N/A 07/01/2019   Procedure: LUMBAR LAMINECTOMY/DECOMPRESSION MICRODISCECTOMY 1 LEVEL, THORACIC SIX-SEVEN;  Surgeon: Consuella Lose, MD;  Location: [facility];  Service: Neurosurgery;  Laterality: N/A;  LUMBAR LAMINECTOMY/DECOMPRESSION MICRODISCECTOMY 1 LEVEL, THORACIC SIX-SEVEN    ORIF PATELLA     THORACIC DISCECTOMY N/A 07/03/2019   Procedure: Evacuation of Thoracic Hematoma;  Surgeon: Consuella Lose, MD;  Location: [facility];  Service: Neurosurgery;  Laterality: N/A;    There were no vitals filed for this visit.   Subjective Assessment - 03/20/21 1707     Subjective No new complaints. No falls or pain to report.    Pertinent History T8 paraplegia,  diabetes, HTN    How long can you stand comfortably? probably a couple minutes with RW.    How long can you walk comfortably? approx. 5-10 minutes with his son and RW.    Patient Stated Goals wants to be able to walk without any assistance    Currently in Pain? No/denies    Pain Score 0-No pain             Aquatic therapy at [facility] - pool temp 84 degrees   Patient seen for aquatic therapy today.  Treatment took place in water 3.5-4.5 feet deep depending upon activity.  Pt entered and exited pool via stairs with bil rails and step to pattern. Min assist to advance left LE for last 2 of 5 steps out of water to wheelchair.   With use of large bar bell transitioned from stairs to ~4.3-4.5 foot water depth with min assist.  With continued use of large bar bell with min guard to min assist for balance performed the following: Forward gait with emphasis on step length and posture for 4 laps across pool/back Backward gait with emphasis on posture as pt tends to lean forward for 4 laps across/back Side stepping left<>right across pool for 4 laps toward each direction with emphasis on posture (hip/trunk extension) and step length  With single bar bells working on static balance with pool wall behind patient, min guard to min assist  for balance Wide base of support for the following: 10-12 reps each with cues on posture and ex form/technique Alternating punching bar bells at water level Holding bar bells under water at sides, then moving them in a "cross country ski" movement Then holding bar bells under water- moving them in shoulder horizontal abduction/adduction motions  Once back in wheelchair transitioned to warmer Jacuzzi/pool. Pt entered via lateral transfer to pool wall and use of UE's to lower to bench in water. Pt exited via stairs with ascending 5 steps with bil rails (min assist to advance left LE for last step) with step to pattern, 3-4 feet on level surface with bil rails, then  descending 3 stairs with bil rails.   Seated at edge of bench in water: With feet in parallel position pt performed sit<>stands for 2 sets of 10 reps with bil HHA/min assist for balance once standing, cues for controlled descent.   With 2.5# bil ankle weights Alternating long arc quads for 15 reps each side with emphasis on slow, controlled movements Alternating marching for 15 reps each side with emphasis on staying tall, and march height With LE's out in extension with LE's floating in water for hip abduction/adduction for 15 reps with notable left LE drift down as pt fatigued.   Pt requires buoyancy of water for support for reduced fall risk with gait training and balance exercises with minimal UE support; exercises able to be performed safely in water without the risk of fall compared to those same exercises performed on land;  viscosity of water needed for resistance for strengthening.  Current of water provides perturbations for challenging static & dynamic standing balance.             PT Short Term Goals - 03/06/21 1912       PT SHORT TERM GOAL #1   Title Pt will improve 3MWT distance with RW to at least 205' in order to demo improved gait efficiency. ALL STGS DUE 03/29/21    Baseline 190' on 02/09/21    Time 4    Period Weeks    Status New    Target Date 03/29/21      PT SHORT TERM GOAL #2   Title Pt will perform sit <> stand from lower mat table consistently with min guard in order to demo improved functional transfers/strength/    Baseline needs min guard/min A    Time 4    Period Weeks    Status New      PT SHORT TERM GOAL #4   Title In aquatic therapy, pt will be able to descend down pool steps with min guard and ascend out of pool steps with mod A with therapist in order to demo improved functional mobility.    Baseline will assess at aquatic session    Time 4    Period Weeks    Status On-going               PT Long Term Goals - 03/06/21 1913       PT  LONG TERM GOAL #1   Title Pt and spouse will be independent with final HEP for BLE stretching and strengthening. ALL LTGS DUE 04/26/21    Baseline pt will continue to benefit from ongoing additions and review, pt reports performing at home    Time 8    Period Weeks    Status On-going    Target Date 04/26/21      PT LONG TERM GOAL #2  Title Pt will improve gait speed with RW and R AFO to at least 1.05 ft/sec in order to demo improved household mobility.    Baseline 37.31 seconds = .87 ft/sec on 11/15/20 (pt more tired from aquatic therapy yesterday); 34.19 seconds = .95 ft/sec on 03/01/21    Time 8    Period Weeks    Status Revised      PT LONG TERM GOAL #3   Title Pt will undergo 6MWT for gait endurance with RW with LTG updated.    Baseline 3MWT: 190' on 02/09/21    Time 8    Period Weeks    Status New      PT LONG TERM GOAL #4   Title Pt will stand at [facility] with min guard with no UE support with min guard for at least 40 seconds in order to demo improved balance for ADLs    Baseline 32.84 seconds on 09/12/20; 1 minute 4 seconds on 11/15/20, best time of 22 seconds on 03/01/21    Time 8    Period Weeks    Status Revised      PT LONG TERM GOAL #5   Title Pt will ambulate at least 345' over level surfaces with RW and min guard consistently in order to demo improved household mobility and improved endurance    Baseline 345' distance with min guard on 02/09/21, but assist does fluctuate based on fatigue    Time 8    Period Weeks    Status Revised                   Plan - 03/20/21 1708     Clinical Impression Statement Today's skilled session continued to focus on strengthening, stretching, gait and balance in the aquatic setting. No issues noted or reported in session.The pt was able to negotiate increased number of stairs with less assistance needed that with prior session. The pt is making steady progress toward goals and should benefit from continued PT to progress toward unmet  goals.    Personal Factors and Comorbidities Comorbidity 3+;Past/Current Experience    Comorbidities T8 paraplegia, diabetes, HTN    Examination-Activity Limitations Locomotion Level;Transfers;Stand    Examination-Participation Restrictions Community Activity;Yard Work    [employer]chant [employer] officer Evolving/Moderate complexity    Rehab Potential Good    PT Frequency 2x / week    PT Duration 12 weeks    PT Treatment/Interventions Aquatic Therapy;Manual techniques;Therapeutic exercise;Gait training;Neuromuscular re-education;Orthotic Fit/Training    PT Next Visit Plan waiting on additional [place]. continue to work on gait distance, SciFit for endurance/ROM/strengthening. step ups in parallel bars, side stepping. hip abductor strengthening, standing tolerance with decreased UE support, quadruped/tall kneeling for hip/core strengthening.    PT Home Exercise Plan Access Code: [email or web address]    Consulted and Agree with Plan of Care Patient             Patient will benefit from skilled therapeutic intervention in order to improve the following deficits and impairments:  Decreased balance, Decreased coordination, Decreased range of motion, Decreased strength, Decreased mobility, Postural dysfunction, Impaired tone, Decreased activity tolerance, Abnormal gait, Decreased endurance, Difficulty walking, Impaired sensation  Visit Diagnosis: Muscle weakness (generalized)  Unsteadiness on feet  Difficulty in walking, not elsewhere classified  Abnormal posture     Problem List Patient Active Problem List   Diagnosis Date Noted   Erectile dysfunction due to diseases classified elsewhere 03/02/2020   Wheelchair dependence 11/27/2019   Spasticity 08/03/2019   Paraplegia ([place]) 07/07/2019  Neurogenic bowel 07/07/2019   Neurogenic bladder 07/07/2019   Thoracic myelopathy 06/30/2019    Willow Ora, PTA, [facility] [address], [place], [place]  [address] [phone] 03/20/21, 7:01 PM   Name: KYREESE CHIO MRN: [id number] Date of Birth: [birth year]

## 2021-03-29 ENCOUNTER — Ambulatory Visit: Payer: No Typology Code available for payment source | Attending: Family Medicine | Admitting: Physical Therapy

## 2021-03-29 ENCOUNTER — Encounter: Payer: Self-pay | Admitting: Physical Therapy

## 2021-03-29 ENCOUNTER — Other Ambulatory Visit: Payer: Self-pay

## 2021-03-29 DIAGNOSIS — R29818 Other symptoms and signs involving the nervous system: Secondary | ICD-10-CM | POA: Diagnosis present

## 2021-03-29 DIAGNOSIS — R293 Abnormal posture: Secondary | ICD-10-CM | POA: Diagnosis present

## 2021-03-29 DIAGNOSIS — R2681 Unsteadiness on feet: Secondary | ICD-10-CM | POA: Insufficient documentation

## 2021-03-29 DIAGNOSIS — M6281 Muscle weakness (generalized): Secondary | ICD-10-CM | POA: Diagnosis not present

## 2021-03-29 DIAGNOSIS — R262 Difficulty in walking, not elsewhere classified: Secondary | ICD-10-CM | POA: Diagnosis present

## 2021-03-29 NOTE — Therapy (Signed)
[facility] [address] [place], [name], [address] Phone: [phone]   Fax:  [phone]  Physical Therapy Treatment  Patient Details  Name: Timothy Davenport MRN: [id number] Date of Birth: 1950/11/22 Referring Provider (PT): Juel Burrow    Encounter Date: 03/29/2021   PT End of Session - 03/29/21 1104     Visit Number 67    Number of Visits 68    Date for PT Re-Evaluation 92/95/74   per re-cert on 7/34/03   Authorization Type VA - 15 visits from 03/20/21 - 09/16/21    Authorization - Visit Number 1    Authorization - Number of Visits 15    Progress Note Due on Visit 39    PT Start Time 1102    PT Stop Time 1143    PT Time Calculation (min) 41 min    Equipment Utilized During Treatment Gait belt    Activity Tolerance Patient tolerated treatment well;Patient limited by fatigue    Behavior During Therapy WFL for tasks assessed/performed             Past Medical History:  Diagnosis Date   Colon polyp    Diabetes mellitus without complication ([place])    Diabetic neuropathy ([place])    HTN (hypertension)    Patella fracture    Renal calculi     Past Surgical History:  Procedure Laterality Date   LUMBAR LAMINECTOMY/DECOMPRESSION MICRODISCECTOMY N/A 07/01/2019   Procedure: LUMBAR LAMINECTOMY/DECOMPRESSION MICRODISCECTOMY 1 LEVEL, THORACIC SIX-SEVEN;  Surgeon: Consuella Lose, MD;  Location: [facility];  Service: Neurosurgery;  Laterality: N/A;  LUMBAR LAMINECTOMY/DECOMPRESSION MICRODISCECTOMY 1 LEVEL, THORACIC SIX-SEVEN    ORIF PATELLA     THORACIC DISCECTOMY N/A 07/03/2019   Procedure: Evacuation of Thoracic Hematoma;  Surgeon: Consuella Lose, MD;  Location: [facility];  Service: Neurosurgery;  Laterality: N/A;    There were no vitals filed for this visit.   Subjective Assessment - 03/29/21 1210     Subjective Nothing new.    Pertinent History T8 paraplegia, diabetes, HTN    How long can you stand comfortably?  probably a couple minutes with RW.    How long can you walk comfortably? approx. 5-10 minutes with his son and RW.    Patient Stated Goals wants to be able to walk without any assistance    Currently in Pain? No/denies                               [facility] Adult PT Treatment/Exercise - 03/29/21 1118       Transfers   Transfers Sit to Stand;Stand to Sit    Sit to Stand 4: Min guard;With upper extremity assist;From bed;4: Min assist    Sit to Stand Details (indicate cue type and reason) From lower mat table performed x10 reps with pt performing with min guard, needing therapist just to assist to steady RW. Cued to tuck feet back. Did need min A to stand from w/c after seated rest break during gait.    Stand to Sit 4: Min guard;4: Min assist    Stand to Sit Details During 2 seated rest breaks during gait, needing min/mod A to sit back to w/c due to fatigue. Otherwise just needed min guard at beginning of session.    Lateral/Scoot Transfers 6: Modified independent (Device/Increase time)    Lateral/Scoot Transfer Details (indicate cue type and reason) wheel chair <> mat table  Ambulation/Gait   Ambulation/Gait Yes    Ambulation/Gait Assistance 4: Min guard;4: Min assist    Ambulation/Gait Assistance Details Cues for tall posture and hip/knee extension throughout. Pt more fatigued during 2nd lap and needing w/c follow with 2 seated rest breaks due to fatigue. Pt more forward flexed with incr knee flexion and difficulty clearing feet. Pt afterwards reporting that he was not wearing his R AFO today. Discussed importance of AFO for stability and gait mechanics and how pt needs to be wearing at all times for gait.    Ambulation Distance (Feet) 230 Feet    Assistive device Rolling walker    Gait Pattern Decreased weight shift to right;Left flexed knee in stance;Right flexed knee in stance;Narrow base of support;Decreased step length - right;Decreased step length - left;Decreased  stance time - right;Decreased hip/knee flexion - right;Step-through pattern;Poor foot clearance - right    Ambulation Surface Level;Indoor      Knee/Hip Exercises: Machines for Strengthening   Total Gym Leg Press Pt able to transfer with supervision using lateral scoot transfer from w/c. Performed 1  x 10 reps at 60# with BLE, cues for eccentric control and knee extension when pushing out. Pt lacking full knee extension with RLE. Performed x10 reps with single leg #30. Needs assist to keep RLE in proper positioning at times.      Knee/Hip Exercises: Supine   Bridges with Clamshell Strengthening;AROM;1 set;10 reps   with use of blue tband - pressing out first into hip ABD   Other Supine Knee/Hip Exercises Alternating marching x7 reps each side, cues for core activation, assist needed with RLE      Knee/Hip Exercises: Prone   Hamstring Curl 1 set;10 reps;Limitations    Hamstring Curl Limitations pt able to initiate muscle contractions, assist needed to complete the action, additional hold for quad/hip flexor stretch                       PT Short Term Goals - 03/29/21 1203       PT SHORT TERM GOAL #1   Title Pt will improve 3MWT distance with RW to at least 205' in order to demo improved gait efficiency. ALL STGS DUE 03/29/21    Baseline 190' on 02/09/21, pt too fatigued today, will perform at next session.    Time 4    Period Weeks    Status Deferred    Target Date 03/29/21      PT SHORT TERM GOAL #2   Title Pt will perform sit <> stand from lower mat table consistently with min guard in order to demo improved functional transfers/strength/    Baseline min guard on 03/29/21, with assist from therapist to steady    Time 4    Period Weeks    Status Achieved      PT SHORT TERM GOAL #4   Title In aquatic therapy, pt will be able to descend down pool steps with min guard and ascend out of pool steps with mod A with therapist in order to demo improved functional mobility.     Baseline will assess at aquatic session    Time 4    Period Weeks    Status On-going               PT Long Term Goals - 03/06/21 1913       PT LONG TERM GOAL #1   Title Pt and spouse will be independent with final HEP for BLE stretching  and strengthening. ALL LTGS DUE 04/26/21    Baseline pt will continue to benefit from ongoing additions and review, pt reports performing at home    Time 8    Period Weeks    Status On-going    Target Date 04/26/21      PT LONG TERM GOAL #2   Title Pt will improve gait speed with RW and R AFO to at least 1.05 ft/sec in order to demo improved household mobility.    Baseline 37.31 seconds = .87 ft/sec on 11/15/20 (pt more tired from aquatic therapy yesterday); 34.19 seconds = .95 ft/sec on 03/01/21    Time 8    Period Weeks    Status Revised      PT LONG TERM GOAL #3   Title Pt will undergo 6MWT for gait endurance with RW with LTG updated.    Baseline 3MWT: 190' on 02/09/21    Time 8    Period Weeks    Status New      PT LONG TERM GOAL #4   Title Pt will stand at [facility] with min guard with no UE support with min guard for at least 40 seconds in order to demo improved balance for ADLs    Baseline 32.84 seconds on 09/12/20; 1 minute 4 seconds on 11/15/20, best time of 22 seconds on 03/01/21    Time 8    Period Weeks    Status Revised      PT LONG TERM GOAL #5   Title Pt will ambulate at least 345' over level surfaces with RW and min guard consistently in order to demo improved household mobility and improved endurance    Baseline 345' distance with min guard on 02/09/21, but assist does fluctuate based on fatigue    Time 8    Period Weeks    Status Revised                   Plan - 03/29/21 1206     Clinical Impression Statement Today's skilled session worked on strengthening and gait. Pt more fatigued during gait today and needed 2 seated rest breaks with w/c follow during 2nd lap. Pt did not wear his R AFO today, educated on  importance of wearing AFO for gait. Pt met STG #2 in regards to sit <> stands, able to perform consistently from lower mat surfaces with min guard. Will check remainder of goals at next session. Pt too tired for 3MWT today. Will continue to progress towards LTGs.    Personal Factors and Comorbidities Comorbidity 3+;Past/Current Experience    Comorbidities T8 paraplegia, diabetes, HTN    Examination-Activity Limitations Locomotion Level;Transfers;Stand    Examination-Participation Restrictions Community Activity;Yard Work    [employer] Evolving/Moderate complexity    Rehab Potential Good    PT Frequency 2x / week    PT Duration 12 weeks    PT Treatment/Interventions Aquatic Therapy;Manual techniques;Therapeutic exercise;Gait training;Neuromuscular re-education;Orthotic Fit/Training    PT Next Visit Plan continue to work on gait distance, SciFit for endurance/ROM/strengthening. step ups in parallel bars, side stepping. hip abductor strengthening, standing tolerance with decreased UE support, quadruped/tall kneeling for hip/core strengthening.    PT Home Exercise Plan Access Code: [email or web address]    Consulted and Agree with Plan of Care Patient             Patient will benefit from skilled therapeutic intervention in order to improve the following deficits and impairments:  Decreased balance, Decreased coordination, Decreased range of motion, Decreased  strength, Decreased mobility, Postural dysfunction, Impaired tone, Decreased activity tolerance, Abnormal gait, Decreased endurance, Difficulty walking, Impaired sensation  Visit Diagnosis: Muscle weakness (generalized)  Difficulty in walking, not elsewhere classified  Abnormal posture  Unsteadiness on feet     Problem List Patient Active Problem List   Diagnosis Date Noted   Erectile dysfunction due to diseases classified elsewhere 03/02/2020   Wheelchair dependence 11/27/2019   Spasticity 08/03/2019   Paraplegia  ([place]) 07/07/2019   Neurogenic bowel 07/07/2019   Neurogenic bladder 07/07/2019   Thoracic myelopathy 06/30/2019    Arliss Journey, PT, DPT  03/29/2021, 12:10 PM  [facility] [address] [place], [name], [address] Phone: [phone]   Fax:  [phone]  Name: Timothy Davenport MRN: [id number] Date of Birth: 1950/03/19

## 2021-04-03 ENCOUNTER — Ambulatory Visit: Payer: No Typology Code available for payment source | Admitting: Physical Therapy

## 2021-04-05 ENCOUNTER — Encounter: Payer: Self-pay | Admitting: Physical Therapy

## 2021-04-05 ENCOUNTER — Ambulatory Visit: Payer: No Typology Code available for payment source | Admitting: Physical Therapy

## 2021-04-05 ENCOUNTER — Other Ambulatory Visit: Payer: Self-pay

## 2021-04-05 DIAGNOSIS — R2681 Unsteadiness on feet: Secondary | ICD-10-CM

## 2021-04-05 DIAGNOSIS — M6281 Muscle weakness (generalized): Secondary | ICD-10-CM

## 2021-04-05 DIAGNOSIS — R262 Difficulty in walking, not elsewhere classified: Secondary | ICD-10-CM

## 2021-04-05 DIAGNOSIS — R293 Abnormal posture: Secondary | ICD-10-CM

## 2021-04-05 NOTE — Therapy (Signed)
[facility] [address] [place], [name], [address] Phone: [phone]   Fax:  [phone]  Physical Therapy Treatment  Patient Details  Name: Timothy Davenport MRN: [id number] Date of Birth: [birth year] Referring Provider (PT): Juel Burrow    Encounter Date: 04/05/2021   PT End of Session - 04/05/21 1103     Visit Number 68    Number of Visits 31    Date for PT Re-Evaluation 73/22/02   per re-cert on 5/42/70   Authorization Type VA - 15 visits from 03/20/21 - 09/16/21    Authorization - Visit Number 2    Authorization - Number of Visits 15    Progress Note Due on Visit 55    PT Start Time 1101    PT Stop Time 1145    PT Time Calculation (min) 44 min    Equipment Utilized During Treatment Gait belt    Activity Tolerance Patient tolerated treatment well;Patient limited by fatigue    Behavior During Therapy WFL for tasks assessed/performed             Past Medical History:  Diagnosis Date   Colon polyp    Diabetes mellitus without complication ([place])    Diabetic neuropathy ([place])    HTN (hypertension)    Patella fracture    Renal calculi     Past Surgical History:  Procedure Laterality Date   LUMBAR LAMINECTOMY/DECOMPRESSION MICRODISCECTOMY N/A 07/01/2019   Procedure: LUMBAR LAMINECTOMY/DECOMPRESSION MICRODISCECTOMY 1 LEVEL, THORACIC SIX-SEVEN;  Surgeon: Consuella Lose, MD;  Location: [facility];  Service: Neurosurgery;  Laterality: N/A;  LUMBAR LAMINECTOMY/DECOMPRESSION MICRODISCECTOMY 1 LEVEL, THORACIC SIX-SEVEN    ORIF PATELLA     THORACIC DISCECTOMY N/A 07/03/2019   Procedure: Evacuation of Thoracic Hematoma;  Surgeon: Consuella Lose, MD;  Location: [facility];  Service: Neurosurgery;  Laterality: N/A;    There were no vitals filed for this visit.   Subjective Assessment - 04/05/21 1103     Subjective No new complaints. No falls or pain to report.    Pertinent History T8 paraplegia, diabetes, HTN     How long can you stand comfortably? probably a couple minutes with RW.    Patient Stated Goals wants to be able to walk without any assistance    Currently in Pain? No/denies    Pain Score 0-No pain                      [facility] Adult PT Treatment/Exercise - 04/05/21 1105       Transfers   Transfers Sit to Stand;Stand to Sit    Sit to Stand 4: Min guard;With upper extremity assist;From bed;4: Min assist    Sit to Stand Details (indicate cue type and reason) from lower mat, scifit chair and leg press seat to RW    Stand to Sit 4: Min guard;4: Min assist    Stand to Sit Details increased assistance needed for controlled descent to lower surface of scifit seat and leg press seat      Ambulation/Gait   Ambulation/Gait Yes    Ambulation/Gait Assistance 4: Min guard    Ambulation/Gait Assistance Details cues for posture, knee extension in stance and increased right step length with gait.    Ambulation Distance (Feet) 20 Feet   x1, 22 x1,80 x1   Assistive device Rolling walker    Gait Pattern Decreased weight shift to right;Left flexed knee in stance;Right flexed knee in stance;Narrow base of support;Decreased step length - right;Decreased  step length - left;Decreased stance time - right;Decreased hip/knee flexion - right;Step-through pattern;Poor foot clearance - right    Ambulation Surface Level;Indoor      Knee/Hip Exercises: Aerobic   Stepper Scifit with BLE only at level 2.5 for 8 minutes with goal >/= 85 steps per minute for strengthening, ROM, endurance. Cues for full knee exension.      Knee/Hip Exercises: Machines for Strengthening   Total Gym Leg Press bil LE's 60# 2 sets of 10 reps, then singe leg 30# 2 sets of 10 reps each side. cues for controlled movements and full knee extension. guarding on left side to ensure no hyperextension.      Knee/Hip Exercises: Supine   Other Supine Knee/Hip Exercises on mat table with red pball: with ball under knees/arms at sides for  bridge for ~5 sec's holds 2 sets of 10 reps;  then with ball at feet for hamstring curls for 2 sets of 10 reps, assist to keep feet on ball with movements needed.                       PT Short Term Goals - 03/29/21 1203       PT SHORT TERM GOAL #1   Title Pt will improve 3MWT distance with RW to at least 205' in order to demo improved gait efficiency. ALL STGS DUE 03/29/21    Baseline 190' on 02/09/21, pt too fatigued today, will perform at next session.    Time 4    Period Weeks    Status Deferred    Target Date 03/29/21      PT SHORT TERM GOAL #2   Title Pt will perform sit <> stand from lower mat table consistently with min guard in order to demo improved functional transfers/strength/    Baseline min guard on 03/29/21, with assist from therapist to steady    Time 4    Period Weeks    Status Achieved      PT SHORT TERM GOAL #4   Title In aquatic therapy, pt will be able to descend down pool steps with min guard and ascend out of pool steps with mod A with therapist in order to demo improved functional mobility.    Baseline will assess at aquatic session    Time 4    Period Weeks    Status On-going               PT Long Term Goals - 03/06/21 1913       PT LONG TERM GOAL #1   Title Pt and spouse will be independent with final HEP for BLE stretching and strengthening. ALL LTGS DUE 04/26/21    Baseline pt will continue to benefit from ongoing additions and review, pt reports performing at home    Time 8    Period Weeks    Status On-going    Target Date 04/26/21      PT LONG TERM GOAL #2   Title Pt will improve gait speed with RW and R AFO to at least 1.05 ft/sec in order to demo improved household mobility.    Baseline 37.31 seconds = .87 ft/sec on 11/15/20 (pt more tired from aquatic therapy yesterday); 34.19 seconds = .95 ft/sec on 03/01/21    Time 8    Period Weeks    Status Revised      PT LONG TERM GOAL #3   Title Pt will undergo 6MWT for gait  endurance with RW with  LTG updated.    Baseline 3MWT: 190' on 02/09/21    Time 8    Period Weeks    Status New      PT LONG TERM GOAL #4   Title Pt will stand at [facility] with min guard with no UE support with min guard for at least 40 seconds in order to demo improved balance for ADLs    Baseline 32.84 seconds on 09/12/20; 1 minute 4 seconds on 11/15/20, best time of 22 seconds on 03/01/21    Time 8    Period Weeks    Status Revised      PT LONG TERM GOAL #5   Title Pt will ambulate at least 345' over level surfaces with RW and min guard consistently in order to demo improved household mobility and improved endurance    Baseline 345' distance with min guard on 02/09/21, but assist does fluctuate based on fatigue    Time 8    Period Weeks    Status Revised                   Plan - 04/05/21 1104     Clinical Impression Statement Today's skilled session continued to focus on strengthening with emphasis on knee extension and gait with RW. No issues noted or reported in session. The pt is making steady progress toward goals and should benefit from continued PT to progress toward unmet goals.    Personal Factors and Comorbidities Comorbidity 3+;Past/Current Experience    Comorbidities T8 paraplegia, diabetes, HTN    Examination-Activity Limitations Locomotion Level;Transfers;Stand    Examination-Participation Restrictions Community Activity;Yard Work    [employer] officer Evolving/Moderate complexity    Rehab Potential Good    PT Frequency 2x / week    PT Duration 12 weeks    PT Treatment/Interventions Aquatic Therapy;Manual techniques;Therapeutic exercise;Gait training;Neuromuscular re-education;Orthotic Fit/Training    PT Next Visit Plan continue to work on gait distance, SciFit for endurance/ROM/strengthening. step ups in parallel bars, side stepping. hip abductor strengthening, standing tolerance with decreased UE support, quadruped/tall kneeling for hip/core  strengthening.    PT Home Exercise Plan Access Code: [email or web address]    Consulted and Agree with Plan of Care Patient             Patient will benefit from skilled therapeutic intervention in order to improve the following deficits and impairments:  Decreased balance, Decreased coordination, Decreased range of motion, Decreased strength, Decreased mobility, Postural dysfunction, Impaired tone, Decreased activity tolerance, Abnormal gait, Decreased endurance, Difficulty walking, Impaired sensation  Visit Diagnosis: Muscle weakness (generalized)  Difficulty in walking, not elsewhere classified  Abnormal posture  Unsteadiness on feet     Problem List Patient Active Problem List   Diagnosis Date Noted   Erectile dysfunction due to diseases classified elsewhere 03/02/2020   Wheelchair dependence 11/27/2019   Spasticity 08/03/2019   Paraplegia ([place]) 07/07/2019   Neurogenic bowel 07/07/2019   Neurogenic bladder 07/07/2019   Thoracic myelopathy 06/30/2019    Willow Ora, PTA, [facility] Outpatient Neuro [facility] [address] [place], [place] [address] [phone] 04/05/21, 1:45 PM   Name: ANDREUS CURE MRN: [id number] Date of Birth: 10-04-1950

## 2021-04-10 ENCOUNTER — Other Ambulatory Visit: Payer: Self-pay

## 2021-04-10 ENCOUNTER — Ambulatory Visit: Payer: Self-pay | Admitting: Physical Therapy

## 2021-04-10 ENCOUNTER — Encounter: Payer: Self-pay | Admitting: Physical Therapy

## 2021-04-10 ENCOUNTER — Ambulatory Visit: Payer: No Typology Code available for payment source | Admitting: Physical Therapy

## 2021-04-10 DIAGNOSIS — R2681 Unsteadiness on feet: Secondary | ICD-10-CM

## 2021-04-10 DIAGNOSIS — R262 Difficulty in walking, not elsewhere classified: Secondary | ICD-10-CM

## 2021-04-10 DIAGNOSIS — M6281 Muscle weakness (generalized): Secondary | ICD-10-CM | POA: Diagnosis not present

## 2021-04-10 DIAGNOSIS — R29818 Other symptoms and signs involving the nervous system: Secondary | ICD-10-CM

## 2021-04-10 DIAGNOSIS — R293 Abnormal posture: Secondary | ICD-10-CM

## 2021-04-10 NOTE — Therapy (Signed)
[facility] [address] [place], [name], [address] Phone: [phone]   Fax:  [phone]  Physical Therapy Treatment  Patient Details  Name: Timothy Davenport MRN: [id number] Date of Birth: 1950/03/24 Referring Provider (PT): Juel Burrow    Encounter Date: 04/10/2021   PT End of Session - 04/10/21 2208     Visit Number 84    Number of Visits 40    Date for PT Re-Evaluation 59/16/38   per re-cert on 4/66/59   Authorization Type VA - 15 visits from 03/20/21 - 09/16/21    Authorization - Visit Number 3    Authorization - Number of Visits 15    Progress Note Due on Visit 5    PT Start Time 1400    PT Stop Time 1442    PT Time Calculation (min) 42 min    Equipment Utilized During Treatment Gait belt;Other (comment)   large bar bell, aquatic step, pool noodle, waist float, aquatic neck pillow/collar   Activity Tolerance Patient tolerated treatment well;Patient limited by fatigue    Behavior During Therapy WFL for tasks assessed/performed             Past Medical History:  Diagnosis Date   Colon polyp    Diabetes mellitus without complication ([place])    Diabetic neuropathy ([place])    HTN (hypertension)    Patella fracture    Renal calculi     Past Surgical History:  Procedure Laterality Date   LUMBAR LAMINECTOMY/DECOMPRESSION MICRODISCECTOMY N/A 07/01/2019   Procedure: LUMBAR LAMINECTOMY/DECOMPRESSION MICRODISCECTOMY 1 LEVEL, THORACIC SIX-SEVEN;  Surgeon: Consuella Lose, MD;  Location: [facility];  Service: Neurosurgery;  Laterality: N/A;  LUMBAR LAMINECTOMY/DECOMPRESSION MICRODISCECTOMY 1 LEVEL, THORACIC SIX-SEVEN    ORIF PATELLA     THORACIC DISCECTOMY N/A 07/03/2019   Procedure: Evacuation of Thoracic Hematoma;  Surgeon: Consuella Lose, MD;  Location: [facility];  Service: Neurosurgery;  Laterality: N/A;    There were no vitals filed for this visit.   Subjective Assessment - 04/10/21 2207     Subjective No  new complaints. No falls or pain to report.    Pertinent History T8 paraplegia, diabetes, HTN    How long can you stand comfortably? probably a couple minutes with RW.    How long can you walk comfortably? approx. 5-10 minutes with his son and RW.    Patient Stated Goals wants to be able to walk without any assistance    Currently in Pain? No/denies    Pain Score 0-No pain             Aquatic therapy at [facility] - pool temp 90 degrees   Patient seen for aquatic therapy today.  Treatment took place in water 3.5-4.5 feet deep depending upon activity.  Pt entered and exited the pool via stairs with bil rails with step to pattern.   Use of wall to walk from stairs to bench in water Long sitting on bench: Passive stretching  With use of long bar bell for gait from bench to ~4.3-4.5 foot depth water Forward  Backward Side stepping  At wall in ~4.5 foot water depth with UE support Use of aquatic step Forward step ups Alternating foot taps  With use of aquatic waist belt/pool noodle/cervical aquaic pillow for pt to float on back with support from PTA at upper body/head Bicycling around deeper end of pool for ~5 minutes With LE's extended out Hip abd/add Bil knee and hip flexion/extension (leg press movements) Alternating pushing one  leg down into water/back up to floating With LE's together moving legs side to side in water to work on core strengthening  Pt requires buoyancy of water for support for reduced fall risk with gait training and balance exercises with minimal UE support; exercises able to be performed safely in water without the risk of fall compared to those same exercises performed on land;  viscosity of water needed for resistance for strengthening.  Current of water provides perturbations for challenging static & dynamic standing balance.                PT Short Term Goals - 03/29/21 1203       PT SHORT TERM GOAL #1   Title Pt will improve 3MWT  distance with RW to at least 205' in order to demo improved gait efficiency. ALL STGS DUE 03/29/21    Baseline 190' on 02/09/21, pt too fatigued today, will perform at next session.    Time 4    Period Weeks    Status Deferred    Target Date 03/29/21      PT SHORT TERM GOAL #2   Title Pt will perform sit <> stand from lower mat table consistently with min guard in order to demo improved functional transfers/strength/    Baseline min guard on 03/29/21, with assist from therapist to steady    Time 4    Period Weeks    Status Achieved      PT SHORT TERM GOAL #4   Title In aquatic therapy, pt will be able to descend down pool steps with min guard and ascend out of pool steps with mod A with therapist in order to demo improved functional mobility.    Baseline will assess at aquatic session    Time 4    Period Weeks    Status On-going               PT Long Term Goals - 03/06/21 1913       PT LONG TERM GOAL #1   Title Pt and spouse will be independent with final HEP for BLE stretching and strengthening. ALL LTGS DUE 04/26/21    Baseline pt will continue to benefit from ongoing additions and review, pt reports performing at home    Time 8    Period Weeks    Status On-going    Target Date 04/26/21      PT LONG TERM GOAL #2   Title Pt will improve gait speed with RW and R AFO to at least 1.05 ft/sec in order to demo improved household mobility.    Baseline 37.31 seconds = .87 ft/sec on 11/15/20 (pt more tired from aquatic therapy yesterday); 34.19 seconds = .95 ft/sec on 03/01/21    Time 8    Period Weeks    Status Revised      PT LONG TERM GOAL #3   Title Pt will undergo 6MWT for gait endurance with RW with LTG updated.    Baseline 3MWT: 190' on 02/09/21    Time 8    Period Weeks    Status New      PT LONG TERM GOAL #4   Title Pt will stand at [facility] with min guard with no UE support with min guard for at least 40 seconds in order to demo improved balance for ADLs    Baseline 32.84  seconds on 09/12/20; 1 minute 4 seconds on 11/15/20, best time of 22 seconds on 03/01/21    Time 8  Period Weeks    Status Revised      PT LONG TERM GOAL #5   Title Pt will ambulate at least 345' over level surfaces with RW and min guard consistently in order to demo improved household mobility and improved endurance    Baseline 345' distance with min guard on 02/09/21, but assist does fluctuate based on fatigue    Time 8    Period Weeks    Status Revised                   Plan - 04/10/21 2209     Clinical Impression Statement Today's skilled session continued to focus on stretching, strengthening, and gait in the aquatic setting. No issues noted or reported in session. The pt is making steady progress and should benefit from continued PT to progress toward unmet goals.    Personal Factors and Comorbidities Comorbidity 3+;Past/Current Experience    Comorbidities T8 paraplegia, diabetes, HTN    Examination-Activity Limitations Locomotion Level;Transfers;Stand    Examination-Participation Restrictions Community Activity;Yard Work    [employer] officer Evolving/Moderate complexity    Rehab Potential Good    PT Frequency 2x / week    PT Duration 12 weeks    PT Treatment/Interventions Aquatic Therapy;Manual techniques;Therapeutic exercise;Gait training;Neuromuscular re-education;Orthotic Fit/Training    PT Next Visit Plan continue to work on gait distance, SciFit for endurance/ROM/strengthening. step ups in parallel bars, side stepping. hip abductor strengthening, standing tolerance with decreased UE support, quadruped/tall kneeling for hip/core strengthening.    PT Home Exercise Plan Access Code: [email or web address]    Consulted and Agree with Plan of Care Patient             Patient will benefit from skilled therapeutic intervention in order to improve the following deficits and impairments:  Decreased balance, Decreased coordination, Decreased range of motion, Decreased  strength, Decreased mobility, Postural dysfunction, Impaired tone, Decreased activity tolerance, Abnormal gait, Decreased endurance, Difficulty walking, Impaired sensation  Visit Diagnosis: Muscle weakness (generalized)  Difficulty in walking, not elsewhere classified  Abnormal posture  Unsteadiness on feet  Other symptoms and signs involving the nervous system     Problem List Patient Active Problem List   Diagnosis Date Noted   Erectile dysfunction due to diseases classified elsewhere 03/02/2020   Wheelchair dependence 11/27/2019   Spasticity 08/03/2019   Paraplegia ([place]) 07/07/2019   Neurogenic bowel 07/07/2019   Neurogenic bladder 07/07/2019   Thoracic myelopathy 06/30/2019   Willow Ora, PTA, [facility] [address], [place], [place] [address] [phone] 04/10/21, 10:23 PM   Name: Timothy Davenport MRN: [id number] Date of Birth: 09-13-1950

## 2021-04-12 ENCOUNTER — Ambulatory Visit: Payer: No Typology Code available for payment source | Admitting: Physical Therapy

## 2021-04-12 ENCOUNTER — Encounter: Payer: Self-pay | Admitting: Physical Therapy

## 2021-04-12 ENCOUNTER — Other Ambulatory Visit: Payer: Self-pay

## 2021-04-12 DIAGNOSIS — R29818 Other symptoms and signs involving the nervous system: Secondary | ICD-10-CM

## 2021-04-12 DIAGNOSIS — R2681 Unsteadiness on feet: Secondary | ICD-10-CM

## 2021-04-12 DIAGNOSIS — R293 Abnormal posture: Secondary | ICD-10-CM

## 2021-04-12 DIAGNOSIS — R262 Difficulty in walking, not elsewhere classified: Secondary | ICD-10-CM

## 2021-04-12 DIAGNOSIS — M6281 Muscle weakness (generalized): Secondary | ICD-10-CM

## 2021-04-12 NOTE — Therapy (Signed)
[facility] [address] [place], [name], [address] Phone: [phone]   Fax:  [phone]  Physical Therapy Treatment  Patient Details  Name: Timothy Davenport MRN: [id number] Date of Birth: 04/06/1950 Referring Provider (PT): Juel Burrow    Encounter Date: 04/12/2021   PT End of Session - 04/12/21 1104     Visit Number 81    Number of Visits 68    Date for PT Re-Evaluation 01/75/10   per re-cert on 2/58/52   Authorization Type VA - 15 visits from 03/20/21 - 09/16/21    Authorization - Visit Number 4    Authorization - Number of Visits 15    Progress Note Due on Visit 91    PT Start Time 1102    PT Stop Time 1143    PT Time Calculation (min) 41 min    Equipment Utilized During Treatment Gait belt;Other (comment)   large bar bell, aquatic step, pool noodle, waist float, aquatic neck pillow/collar   Activity Tolerance Patient tolerated treatment well;Patient limited by fatigue    Behavior During Therapy WFL for tasks assessed/performed             Past Medical History:  Diagnosis Date   Colon polyp    Diabetes mellitus without complication ([place])    Diabetic neuropathy ([place])    HTN (hypertension)    Patella fracture    Renal calculi     Past Surgical History:  Procedure Laterality Date   LUMBAR LAMINECTOMY/DECOMPRESSION MICRODISCECTOMY N/A 07/01/2019   Procedure: LUMBAR LAMINECTOMY/DECOMPRESSION MICRODISCECTOMY 1 LEVEL, THORACIC SIX-SEVEN;  Surgeon: Consuella Lose, MD;  Location: [facility];  Service: Neurosurgery;  Laterality: N/A;  LUMBAR LAMINECTOMY/DECOMPRESSION MICRODISCECTOMY 1 LEVEL, THORACIC SIX-SEVEN    ORIF PATELLA     THORACIC DISCECTOMY N/A 07/03/2019   Procedure: Evacuation of Thoracic Hematoma;  Surgeon: Consuella Lose, MD;  Location: [facility];  Service: Neurosurgery;  Laterality: N/A;    There were no vitals filed for this visit.   Subjective Assessment - 04/12/21 1104     Subjective No  new complaints. No falls or pain to report. Felt okay after last session, a little tired.    Pertinent History T8 paraplegia, diabetes, HTN    How long can you stand comfortably? probably a couple minutes with RW.    How long can you walk comfortably? approx. 5-10 minutes with his son and RW.    Patient Stated Goals wants to be able to walk without any assistance    Currently in Pain? No/denies    Pain Score 0-No pain              \        [facility] Adult PT Treatment/Exercise - 04/12/21 1105       Transfers   Transfers Sit to Stand;Stand to Sit    Sit to Stand 4: Min guard;With upper extremity assist;From bed;From chair/3-in-1    Sit to Stand Details (indicate cue type and reason) from low mat table, Scifit, Legpress and chair x2 reps    Stand to Sit 4: Min guard;With upper extremity assist;To bed;To chair/3-in-1    Stand to Sit Details pt with conrolled descent to all surfaces      Ambulation/Gait   Ambulation/Gait Yes    Ambulation/Gait Assistance 4: Min guard    Ambulation/Gait Assistance Details reminder cues on posture. pt with improved knee extension in stance. increaed foot scuffing and stance knee flexion noted as pt fatigued.    Ambulation Distance (Feet)  20 Feet   x4 reps   Assistive device Rolling walker    Gait Pattern Decreased weight shift to right;Left flexed knee in stance;Right flexed knee in stance;Narrow base of support;Decreased step length - right;Decreased step length - left;Decreased stance time - right;Decreased hip/knee flexion - right;Step-through pattern;Poor foot clearance - right    Ambulation Surface Level;Indoor      High Level Balance   High Level Balance Activities Side stepping    High Level Balance Comments in parallel bars: side stepping x 2 laps each way with min guard to min assist, heavy UE reliance on bars.      Knee/Hip Exercises: Aerobic   Stepper Scifit with BLE only at level 2.5 for 8 minutes with goal >/= 85 steps per minute for  strengthening, ROM, endurance. Cues for full knee exension.      Knee/Hip Exercises: Machines for Strengthening   Total Gym Leg Press bil LE's 60# 2 sets of 10 reps, then singe leg 30# 2 sets of 10 reps each side. cues for controlled movements and full knee extension. guarding on left side to ensure no hyperextension.                       PT Short Term Goals - 03/29/21 1203       PT SHORT TERM GOAL #1   Title Pt will improve 3MWT distance with RW to at least 205' in order to demo improved gait efficiency. ALL STGS DUE 03/29/21    Baseline 190' on 02/09/21, pt too fatigued today, will perform at next session.    Time 4    Period Weeks    Status Deferred    Target Date 03/29/21      PT SHORT TERM GOAL #2   Title Pt will perform sit <> stand from lower mat table consistently with min guard in order to demo improved functional transfers/strength/    Baseline min guard on 03/29/21, with assist from therapist to steady    Time 4    Period Weeks    Status Achieved      PT SHORT TERM GOAL #4   Title In aquatic therapy, pt will be able to descend down pool steps with min guard and ascend out of pool steps with mod A with therapist in order to demo improved functional mobility.    Baseline will assess at aquatic session    Time 4    Period Weeks    Status On-going               PT Long Term Goals - 03/06/21 1913       PT LONG TERM GOAL #1   Title Pt and spouse will be independent with final HEP for BLE stretching and strengthening. ALL LTGS DUE 04/26/21    Baseline pt will continue to benefit from ongoing additions and review, pt reports performing at home    Time 8    Period Weeks    Status On-going    Target Date 04/26/21      PT LONG TERM GOAL #2   Title Pt will improve gait speed with RW and R AFO to at least 1.05 ft/sec in order to demo improved household mobility.    Baseline 37.31 seconds = .87 ft/sec on 11/15/20 (pt more tired from aquatic therapy yesterday);  34.19 seconds = .95 ft/sec on 03/01/21    Time 8    Period Weeks    Status Revised  PT LONG TERM GOAL #3   Title Pt will undergo 6MWT for gait endurance with RW with LTG updated.    Baseline 3MWT: 190' on 02/09/21    Time 8    Period Weeks    Status New      PT LONG TERM GOAL #4   Title Pt will stand at [facility] with min guard with no UE support with min guard for at least 40 seconds in order to demo improved balance for ADLs    Baseline 32.84 seconds on 09/12/20; 1 minute 4 seconds on 11/15/20, best time of 22 seconds on 03/01/21    Time 8    Period Weeks    Status Revised      PT LONG TERM GOAL #5   Title Pt will ambulate at least 345' over level surfaces with RW and min guard consistently in order to demo improved household mobility and improved endurance    Baseline 345' distance with min guard on 02/09/21, but assist does fluctuate based on fatigue    Time 8    Period Weeks    Status Revised                   Plan - 04/12/21 1105     Clinical Impression Statement Today's skilled session continued to focus on LE strengthening and gait with short rest breaks needed due to fatigue. No other issues noted or reported in session. The pt is making progress toward goals and should beneift from continued PT to progress toward unmet goals.    Personal Factors and Comorbidities Comorbidity 3+;Past/Current Experience    Comorbidities T8 paraplegia, diabetes, HTN    Examination-Activity Limitations Locomotion Level;Transfers;Stand    Examination-Participation Restrictions Community Activity;Yard Work    [employer] officer Evolving/Moderate complexity    Rehab Potential Good    PT Frequency 2x / week    PT Duration 12 weeks    PT Treatment/Interventions Aquatic Therapy;Manual techniques;Therapeutic exercise;Gait training;Neuromuscular re-education;Orthotic Fit/Training    PT Next Visit Plan continue to work on gait distance, SciFit for endurance/ROM/strengthening.  step ups in parallel bars, side stepping. hip abductor strengthening, standing tolerance with decreased UE support, quadruped/tall kneeling for hip/core strengthening.    PT Home Exercise Plan Access Code: [email or web address]    Consulted and Agree with Plan of Care Patient             Patient will benefit from skilled therapeutic intervention in order to improve the following deficits and impairments:  Decreased balance, Decreased coordination, Decreased range of motion, Decreased strength, Decreased mobility, Postural dysfunction, Impaired tone, Decreased activity tolerance, Abnormal gait, Decreased endurance, Difficulty walking, Impaired sensation  Visit Diagnosis: Muscle weakness (generalized)  Difficulty in walking, not elsewhere classified  Abnormal posture  Unsteadiness on feet  Other symptoms and signs involving the nervous system     Problem List Patient Active Problem List   Diagnosis Date Noted   Erectile dysfunction due to diseases classified elsewhere 03/02/2020   Wheelchair dependence 11/27/2019   Spasticity 08/03/2019   Paraplegia ([place]) 07/07/2019   Neurogenic bowel 07/07/2019   Neurogenic bladder 07/07/2019   Thoracic myelopathy 06/30/2019    Willow Ora, PTA, [facility] [address] [place], [place] [address] [phone] 04/12/21, 11:02 PM   Name: Timothy Davenport MRN: [id number] Date of Birth: 12-Aug-1950

## 2021-04-17 ENCOUNTER — Ambulatory Visit: Payer: Self-pay | Admitting: Physical Therapy

## 2021-04-19 ENCOUNTER — Ambulatory Visit: Payer: No Typology Code available for payment source | Attending: Family Medicine | Admitting: Physical Therapy

## 2021-04-19 ENCOUNTER — Encounter: Payer: Self-pay | Admitting: Physical Therapy

## 2021-04-19 ENCOUNTER — Other Ambulatory Visit: Payer: Self-pay

## 2021-04-19 ENCOUNTER — Ambulatory Visit: Payer: No Typology Code available for payment source | Admitting: Physical Therapy

## 2021-04-19 DIAGNOSIS — R2681 Unsteadiness on feet: Secondary | ICD-10-CM | POA: Insufficient documentation

## 2021-04-19 DIAGNOSIS — R293 Abnormal posture: Secondary | ICD-10-CM | POA: Diagnosis present

## 2021-04-19 DIAGNOSIS — R262 Difficulty in walking, not elsewhere classified: Secondary | ICD-10-CM | POA: Diagnosis present

## 2021-04-19 DIAGNOSIS — M6281 Muscle weakness (generalized): Secondary | ICD-10-CM | POA: Insufficient documentation

## 2021-04-19 NOTE — Therapy (Signed)
[facility] [address] [place], [name], [address] Phone: [phone]   Fax:  [phone]  Physical Therapy Treatment  Patient Details  Name: Timothy Davenport MRN: [id number] Date of Birth: 1950-10-29 Referring Provider (PT): Juel Burrow    Encounter Date: 04/19/2021   PT End of Session - 04/19/21 1106     Visit Number 74    Number of Visits 57    Date for PT Re-Evaluation 85/63/14   per re-cert on 9/70/26   Authorization Type [facility] - 15 visits from 03/20/21 - 09/16/21    Authorization - Visit Number 5    Authorization - Number of Visits 15    Progress Note Due on Visit 77    PT Start Time 1104    Equipment Utilized During Treatment Gait belt;Other (comment)   large bar bell, aquatic step, pool noodle, waist float, aquatic neck pillow/collar   Activity Tolerance Patient tolerated treatment well;Patient limited by fatigue    Behavior During Therapy WFL for tasks assessed/performed             Past Medical History:  Diagnosis Date   Colon polyp    Diabetes mellitus without complication ([place])    Diabetic neuropathy ([place])    HTN (hypertension)    Patella fracture    Renal calculi     Past Surgical History:  Procedure Laterality Date   LUMBAR LAMINECTOMY/DECOMPRESSION MICRODISCECTOMY N/A 07/01/2019   Procedure: LUMBAR LAMINECTOMY/DECOMPRESSION MICRODISCECTOMY 1 LEVEL, THORACIC SIX-SEVEN;  Surgeon: Consuella Lose, MD;  Location: [facility];  Service: Neurosurgery;  Laterality: N/A;  LUMBAR LAMINECTOMY/DECOMPRESSION MICRODISCECTOMY 1 LEVEL, THORACIC SIX-SEVEN    ORIF PATELLA     THORACIC DISCECTOMY N/A 07/03/2019   Procedure: Evacuation of Thoracic Hematoma;  Surgeon: Consuella Lose, MD;  Location: [facility];  Service: Neurosurgery;  Laterality: N/A;    There were no vitals filed for this visit.   Subjective Assessment - 04/19/21 1107     Subjective Saw his urologist the other day who told pt that he needs to  wear his brace and shoes at all times when he is up.    Pertinent History T8 paraplegia, diabetes, HTN    How long can you stand comfortably? probably a couple minutes with RW.    How long can you walk comfortably? approx. 5-10 minutes with his son and RW.    Patient Stated Goals wants to be able to walk without any assistance    Currently in Pain? No/denies                               [facility] Adult PT Treatment/Exercise - 04/19/21 1136       Transfers   Transfers Sit to Stand;Stand to Sit    Sit to Stand 4: Min guard;With upper extremity assist;From bed;From chair/3-in-1;4: Min assist    Sit to Stand Details (indicate cue type and reason) From mat table prior to gait x5 reps with min guard, from pt's w/c after needing a rest break during gait, pt needing min A.    Stand to Sit 3: Mod assist;4: Min guard    Stand to Sit Details Pt needing min guard to sit back to mat table, pt needing mod A when sitting back to manual w/c due to foot plate in front and pt really needing to lean back.      Ambulation/Gait   Ambulation/Gait Yes    Ambulation/Gait Assistance 4: Min guard;4:  Min assist    Ambulation/Gait Assistance Details Pt needing cues for upright posture and knee extension. Pt needing brief seated rest break after 1st lap and then another one halfway through 2nd lap. During 2nd lap of 115' - pt demonstrating incr foot scuffing with pt ambulating on his R>L toes and forward flexed posture needing min A towards end of bout. W/c follow needed.    Ambulation Distance (Feet) 230 Feet    Assistive device Rolling walker    Gait Pattern Decreased weight shift to right;Left flexed knee in stance;Right flexed knee in stance;Narrow base of support;Decreased step length - right;Decreased step length - left;Decreased stance time - right;Decreased hip/knee flexion - right;Step-through pattern;Poor foot clearance - right    Ambulation Surface Level;Indoor      Self-Care   Self-Care  Other Self-Care Comments    Other Self-Care Comments  Pt reports that he has lost about 25 lbs (in the past 3 months) and reports that he does not have an appetite. Pt mentioned to his physician the other day. Has had blood work and other tests done the other day and they are going to send these to his PCP. Has not yet heard the results of these yet. Discussed making sure that pt follows up with Dr. Dagoberto Ligas about this. Pt to call Dr. Dagoberto Ligas after session today to make her aware      Knee/Hip Exercises: Aerobic   Stepper Scifit with BLE only at level 2.5 for 8 minutes with goal >/= 85 steps per minute for strengthening, ROM, endurance. Cues for full knee exension. Pt able to transfer from w/c <> SciFit with supervision.      Knee/Hip Exercises: Standing   Forward Step Up Both;Hand Hold: 2;Step Height: 4"    Forward Step Up Limitations Performed with 2 therapists -one therapist on pt's L and 2nd on pt's R in // bars with pt's w/c behind him. One therapist helped place RLE on 4" step and then performed step ups and tapping LLE to step 2 x 5 reps, performed with mod/max A for balance. Needing cues to stay tall and for posture throughout. Seated rest break afterwards.  Then performed with LLE on step (therapist helping with proper placement) and one therapist providing mod A to bring RLE on step for step up (2nd therapist mod A for balance) performed x5 reps, at end of 5th rep pt with incr forward flexed posture and unable to maintain RLE knee extension and unable to bring LLE back down to the ground to step down. Pt needing max/total A x2. to help stay upright.  Another theapist had to come bring pt's manual w/c closer and then 2 therapists lowered pt down into chair with max A.                     PT Education - 04/19/21 1228     Education Details See self care section.    Person(s) Educated Patient    Methods Explanation    Comprehension Verbalized understanding              PT Short  Term Goals - 03/29/21 1203       PT SHORT TERM GOAL #1   Title Pt will improve 3MWT distance with RW to at least 205' in order to demo improved gait efficiency. ALL STGS DUE 03/29/21    Baseline 190' on 02/09/21, pt too fatigued today, will perform at next session.    Time 4  Period Weeks    Status Deferred    Target Date 03/29/21      PT SHORT TERM GOAL #2   Title Pt will perform sit <> stand from lower mat table consistently with min guard in order to demo improved functional transfers/strength/    Baseline min guard on 03/29/21, with assist from therapist to steady    Time 4    Period Weeks    Status Achieved      PT SHORT TERM GOAL #4   Title In aquatic therapy, pt will be able to descend down pool steps with min guard and ascend out of pool steps with mod A with therapist in order to demo improved functional mobility.    Baseline will assess at aquatic session    Time 4    Period Weeks    Status On-going               PT Long Term Goals - 03/06/21 1913       PT LONG TERM GOAL #1   Title Pt and spouse will be independent with final HEP for BLE stretching and strengthening. ALL LTGS DUE 04/26/21    Baseline pt will continue to benefit from ongoing additions and review, pt reports performing at home    Time 8    Period Weeks    Status On-going    Target Date 04/26/21      PT LONG TERM GOAL #2   Title Pt will improve gait speed with RW and R AFO to at least 1.05 ft/sec in order to demo improved household mobility.    Baseline 37.31 seconds = .87 ft/sec on 11/15/20 (pt more tired from aquatic therapy yesterday); 34.19 seconds = .95 ft/sec on 03/01/21    Time 8    Period Weeks    Status Revised      PT LONG TERM GOAL #3   Title Pt will undergo 6MWT for gait endurance with RW with LTG updated.    Baseline 3MWT: 190' on 02/09/21    Time 8    Period Weeks    Status New      PT LONG TERM GOAL #4   Title Pt will stand at [facility] with min guard with no UE support with min  guard for at least 40 seconds in order to demo improved balance for ADLs    Baseline 32.84 seconds on 09/12/20; 1 minute 4 seconds on 11/15/20, best time of 22 seconds on 03/01/21    Time 8    Period Weeks    Status Revised      PT LONG TERM GOAL #5   Title Pt will ambulate at least 345' over level surfaces with RW and min guard consistently in order to demo improved household mobility and improved endurance    Baseline 345' distance with min guard on 02/09/21, but assist does fluctuate based on fatigue    Time 8    Period Weeks    Status Revised                   Plan - 04/19/21 1229     Clinical Impression Statement Pt only able to ambulate 115' today with RW with min guard before needing a rest break due to fatigue. During 2nd lap around gym, pt needing another seated rest break and pt demonstrated incr forward flexed posture and decr foot clearance esp with RLE, needing min A. Pt needing incr assistance of +2 therapists for performing step ups to  4" step today in // bars (see above for more details) . Pt reports that he has lost weight in the past 3 months and has not had an appetite. Pt saw his PCP the other day and mentioned this and they are doing further testing. Pt to also reach out to Dr. Dagoberto Ligas today regarding this.    Personal Factors and Comorbidities Comorbidity 3+;Past/Current Experience    Comorbidities T8 paraplegia, diabetes, HTN    Examination-Activity Limitations Locomotion Level;Transfers;Stand    Examination-Participation Restrictions Community Activity;Yard Work    [employer] Evolving/Moderate complexity    Rehab Potential Good    PT Frequency 2x / week    PT Duration 12 weeks    PT Treatment/Interventions Aquatic Therapy;Manual techniques;Therapeutic exercise;Gait training;Neuromuscular re-education;Orthotic Fit/Training    PT Next Visit Plan LTGs due next week - will just have to send them to me so that i can update them for 12 weeks  instead of doing a re-cert. continue to work on gait distance, Gutierrez for endurance/ROM/strengthening. step ups in parallel bars, side stepping. hip abductor strengthening, standing tolerance with decreased UE support, quadruped/tall kneeling for hip/core strengthening.    PT Home Exercise Plan Access Code: [email or web address]    Consulted and Agree with Plan of Care Patient             Patient will benefit from skilled therapeutic intervention in order to improve the following deficits and impairments:  Decreased balance, Decreased coordination, Decreased range of motion, Decreased strength, Decreased mobility, Postural dysfunction, Impaired tone, Decreased activity tolerance, Abnormal gait, Decreased endurance, Difficulty walking, Impaired sensation  Visit Diagnosis: Muscle weakness (generalized)  Difficulty in walking, not elsewhere classified  Abnormal posture  Unsteadiness on feet     Problem List Patient Active Problem List   Diagnosis Date Noted   Erectile dysfunction due to diseases classified elsewhere 03/02/2020   Wheelchair dependence 11/27/2019   Spasticity 08/03/2019   Paraplegia ([place]) 07/07/2019   Neurogenic bowel 07/07/2019   Neurogenic bladder 07/07/2019   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

## 2021-04-20 ENCOUNTER — Telehealth: Payer: Self-pay | Admitting: Physical Medicine and Rehabilitation

## 2021-04-20 NOTE — Telephone Encounter (Signed)
Went to [place] yesterday to see had a prosthetic dr Audie Clear he meds he was on and was not taking correctly Gabapentin not taking correctly he wants it to be taken 3x day.  He is loosing weight lost 30 lb in last year  ?

## 2021-04-21 NOTE — Telephone Encounter (Signed)
?  Changed Gabapentin to 800 mg TID-  ? ?Is losing weight- doesn't have an appetite.  ? ?Didn't tell Dr Audie Clear- about weight loss of 30 lbs in last 1 year.  ? ?Suggested he call Dr Audie Clear and let them know about weight loss and ask for a work up on this.  ?Discussed with pt directly.  ?

## 2021-04-24 ENCOUNTER — Inpatient Hospital Stay (HOSPITAL_BASED_OUTPATIENT_CLINIC_OR_DEPARTMENT_OTHER)
Admission: EM | Admit: 2021-04-24 | Discharge: 2021-04-26 | DRG: 378 | Disposition: A | Discharge status: Home / Self Care | Payer: No Typology Code available for payment source | Attending: Internal Medicine | Admitting: Internal Medicine

## 2021-04-24 ENCOUNTER — Emergency Department (HOSPITAL_BASED_OUTPATIENT_CLINIC_OR_DEPARTMENT_OTHER): Payer: No Typology Code available for payment source

## 2021-04-24 ENCOUNTER — Ambulatory Visit: Payer: No Typology Code available for payment source | Admitting: Physical Therapy

## 2021-04-24 ENCOUNTER — Other Ambulatory Visit: Payer: Self-pay

## 2021-04-24 ENCOUNTER — Encounter (HOSPITAL_BASED_OUTPATIENT_CLINIC_OR_DEPARTMENT_OTHER): Payer: Self-pay | Admitting: Emergency Medicine

## 2021-04-24 DIAGNOSIS — Z6821 Body mass index (BMI) 21.0-21.9, adult: Secondary | ICD-10-CM

## 2021-04-24 DIAGNOSIS — K922 Gastrointestinal hemorrhage, unspecified: Secondary | ICD-10-CM

## 2021-04-24 DIAGNOSIS — I129 Hypertensive chronic kidney disease with stage 1 through stage 4 chronic kidney disease, or unspecified chronic kidney disease: Secondary | ICD-10-CM | POA: Diagnosis present

## 2021-04-24 DIAGNOSIS — D509 Iron deficiency anemia, unspecified: Secondary | ICD-10-CM | POA: Diagnosis present

## 2021-04-24 DIAGNOSIS — K5731 Diverticulosis of large intestine without perforation or abscess with bleeding: Secondary | ICD-10-CM | POA: Diagnosis not present

## 2021-04-24 DIAGNOSIS — R319 Hematuria, unspecified: Secondary | ICD-10-CM

## 2021-04-24 DIAGNOSIS — Z794 Long term (current) use of insulin: Secondary | ICD-10-CM

## 2021-04-24 DIAGNOSIS — B9689 Other specified bacterial agents as the cause of diseases classified elsewhere: Secondary | ICD-10-CM | POA: Diagnosis not present

## 2021-04-24 DIAGNOSIS — E1122 Type 2 diabetes mellitus with diabetic chronic kidney disease: Secondary | ICD-10-CM | POA: Diagnosis not present

## 2021-04-24 DIAGNOSIS — N390 Urinary tract infection, site not specified: Secondary | ICD-10-CM

## 2021-04-24 DIAGNOSIS — K921 Melena: Secondary | ICD-10-CM | POA: Diagnosis present

## 2021-04-24 DIAGNOSIS — N200 Calculus of kidney: Secondary | ICD-10-CM | POA: Diagnosis not present

## 2021-04-24 DIAGNOSIS — E1140 Type 2 diabetes mellitus with diabetic neuropathy, unspecified: Secondary | ICD-10-CM | POA: Diagnosis not present

## 2021-04-24 DIAGNOSIS — Z86010 Personal history of colonic polyps: Secondary | ICD-10-CM

## 2021-04-24 DIAGNOSIS — Z888 Allergy status to other drugs, medicaments and biological substances status: Secondary | ICD-10-CM

## 2021-04-24 DIAGNOSIS — E440 Moderate protein-calorie malnutrition: Secondary | ICD-10-CM | POA: Diagnosis present

## 2021-04-24 DIAGNOSIS — Z20822 Contact with and (suspected) exposure to covid-19: Secondary | ICD-10-CM | POA: Diagnosis not present

## 2021-04-24 DIAGNOSIS — I10 Essential (primary) hypertension: Secondary | ICD-10-CM | POA: Diagnosis present

## 2021-04-24 DIAGNOSIS — Z993 Dependence on wheelchair: Secondary | ICD-10-CM | POA: Diagnosis not present

## 2021-04-24 DIAGNOSIS — Z7982 Long term (current) use of aspirin: Secondary | ICD-10-CM

## 2021-04-24 DIAGNOSIS — Z79899 Other long term (current) drug therapy: Secondary | ICD-10-CM

## 2021-04-24 DIAGNOSIS — N182 Chronic kidney disease, stage 2 (mild): Secondary | ICD-10-CM | POA: Diagnosis not present

## 2021-04-24 DIAGNOSIS — Z8249 Family history of ischemic heart disease and other diseases of the circulatory system: Secondary | ICD-10-CM | POA: Diagnosis not present

## 2021-04-24 DIAGNOSIS — Z87442 Personal history of urinary calculi: Secondary | ICD-10-CM

## 2021-04-24 DIAGNOSIS — K769 Liver disease, unspecified: Secondary | ICD-10-CM | POA: Diagnosis not present

## 2021-04-24 DIAGNOSIS — Z7984 Long term (current) use of oral hypoglycemic drugs: Secondary | ICD-10-CM | POA: Diagnosis not present

## 2021-04-24 DIAGNOSIS — D649 Anemia, unspecified: Secondary | ICD-10-CM | POA: Diagnosis present

## 2021-04-24 DIAGNOSIS — G959 Disease of spinal cord, unspecified: Secondary | ICD-10-CM | POA: Diagnosis not present

## 2021-04-24 DIAGNOSIS — E119 Type 2 diabetes mellitus without complications: Secondary | ICD-10-CM

## 2021-04-24 DIAGNOSIS — N319 Neuromuscular dysfunction of bladder, unspecified: Secondary | ICD-10-CM | POA: Diagnosis present

## 2021-04-24 DIAGNOSIS — G8220 Paraplegia, unspecified: Secondary | ICD-10-CM | POA: Diagnosis present

## 2021-04-24 DIAGNOSIS — M4714 Other spondylosis with myelopathy, thoracic region: Secondary | ICD-10-CM | POA: Diagnosis present

## 2021-04-24 LAB — COMPREHENSIVE METABOLIC PANEL
ALT: 11 U/L (ref 0–44)
AST: 12 U/L — ABNORMAL LOW (ref 15–41)
Albumin: 3.5 g/dL (ref 3.5–5.0)
Alkaline Phosphatase: 51 U/L (ref 38–126)
Anion gap: 7 (ref 5–15)
BUN: 37 mg/dL — ABNORMAL HIGH (ref 8–23)
CO2: 26 mmol/L (ref 22–32)
Calcium: 9.6 mg/dL (ref 8.9–10.3)
Chloride: 104 mmol/L (ref 98–111)
Creatinine, Ser: 1.4 mg/dL — ABNORMAL HIGH (ref 0.61–1.24)
GFR, Estimated: 53 mL/min — ABNORMAL LOW (ref >60)
Glucose, Bld: 141 mg/dL — ABNORMAL HIGH (ref 70–99)
Potassium: 4.1 mmol/L (ref 3.5–5.1)
Sodium: 137 mmol/L (ref 135–145)
Total Bilirubin: 0.3 mg/dL (ref 0.3–1.2)
Total Protein: 7.3 g/dL (ref 6.5–8.1)

## 2021-04-24 LAB — RESP PANEL BY RT-PCR (FLU A&B, COVID) ARPGX2
Influenza A by PCR: NEGATIVE
Influenza B by PCR: NEGATIVE
SARS Coronavirus 2 by RT PCR: NEGATIVE

## 2021-04-24 LAB — URINALYSIS, ROUTINE W REFLEX MICROSCOPIC
Bilirubin Urine: NEGATIVE
Glucose, UA: 500 mg/dL — AB
Hgb urine dipstick: NEGATIVE
Ketones, ur: NEGATIVE mg/dL
Nitrite: POSITIVE — AB
Protein, ur: NEGATIVE mg/dL
Specific Gravity, Urine: 1.037 — ABNORMAL HIGH (ref 1.005–1.030)
WBC, UA: >50 WBC/hpf — ABNORMAL HIGH (ref 0–5)
pH: 5.5 (ref 5.0–8.0)

## 2021-04-24 LAB — TYPE AND SCREEN
ABO/RH(D): A POS
Antibody Screen: NEGATIVE

## 2021-04-24 LAB — CBC
HCT: 30.1 % — ABNORMAL LOW (ref 39.0–52.0)
HCT: 25.8 % — ABNORMAL LOW (ref 39.0–52.0)
Hemoglobin: 9.2 g/dL — ABNORMAL LOW (ref 13.0–17.0)
Hemoglobin: 8.1 g/dL — ABNORMAL LOW (ref 13.0–17.0)
MCH: 24 pg — ABNORMAL LOW (ref 26.0–34.0)
MCH: 24.8 pg — ABNORMAL LOW (ref 26.0–34.0)
MCHC: 30.6 g/dL (ref 30.0–36.0)
MCHC: 31.4 g/dL (ref 30.0–36.0)
MCV: 78.4 fL — ABNORMAL LOW (ref 80.0–100.0)
MCV: 79.1 fL — ABNORMAL LOW (ref 80.0–100.0)
Platelets: 242 10*3/uL (ref 150–400)
Platelets: 201 10*3/uL (ref 150–400)
RBC: 3.84 MIL/uL — ABNORMAL LOW (ref 4.22–5.81)
RBC: 3.26 MIL/uL — ABNORMAL LOW (ref 4.22–5.81)
RDW: 18.2 % — ABNORMAL HIGH (ref 11.5–15.5)
RDW: 18.1 % — ABNORMAL HIGH (ref 11.5–15.5)
WBC: 10.4 10*3/uL (ref 4.0–10.5)
WBC: 9.6 10*3/uL (ref 4.0–10.5)
nRBC: 0 % (ref 0.0–0.2)
nRBC: 0 % (ref 0.0–0.2)

## 2021-04-24 LAB — PROTIME-INR
INR: 1 (ref 0.8–1.2)
Prothrombin Time: 13.6 seconds (ref 11.4–15.2)

## 2021-04-24 IMAGING — CT CT CTA ABD/PEL W/CM AND/OR W/O CM
3 of 10 series · 10 of 46 positions shown, 16 images · IV contrast (APPLIED)
Comparison: None.

CLINICAL DATA: Lower GI bleed.  30 pound weight loss in 3 months.

EXAM:
CTA ABDOMEN AND PELVIS WITHOUT AND WITH CONTRAST
TECHNIQUE: Multidetector CT imaging of the abdomen and pelvis was performed
using the standard protocol during bolus administration of
intravenous contrast. Multiplanar reconstructed images and MIPs were
obtained and reviewed to evaluate the vascular anatomy.

[Series 5: arterial · axial · arterial · 0.73mm/px · z∈[+1010,+1184]mm · 4 of 219 slices shown]
[im 15/219  soft-tissue]
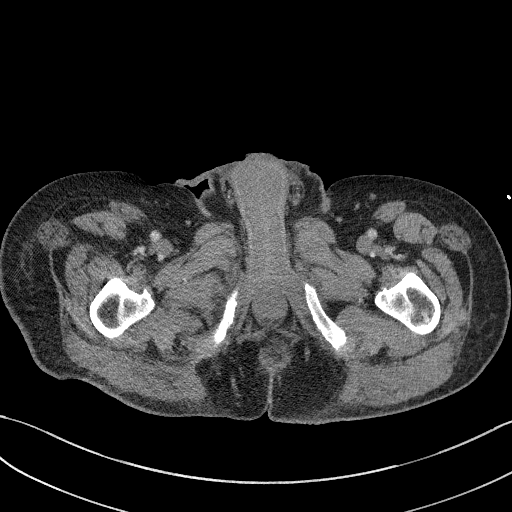
[im 44/219  soft-tissue]
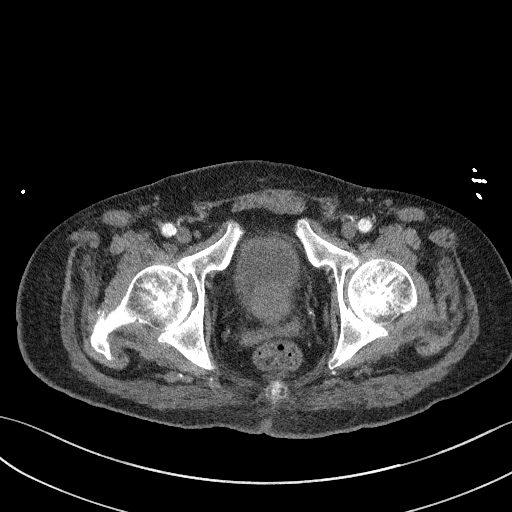
[im 73/219  soft-tissue]
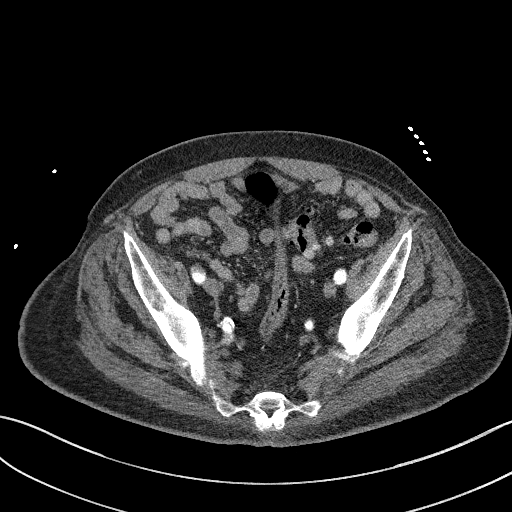
[im 102/219  soft-tissue]
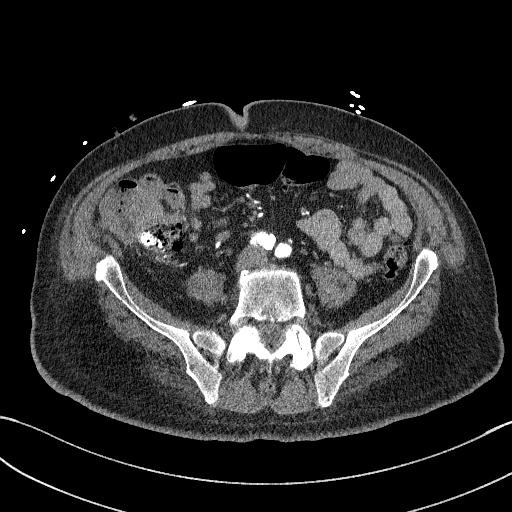

[Series 7: cor art · coronal · 0.76mm/px · 2 of 136 slices shown, 3 images]
[im 46/136  soft-tissue]
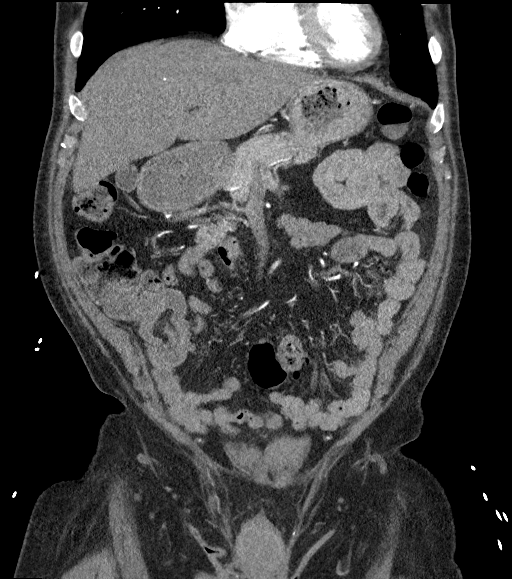
[im 46/136  bone]
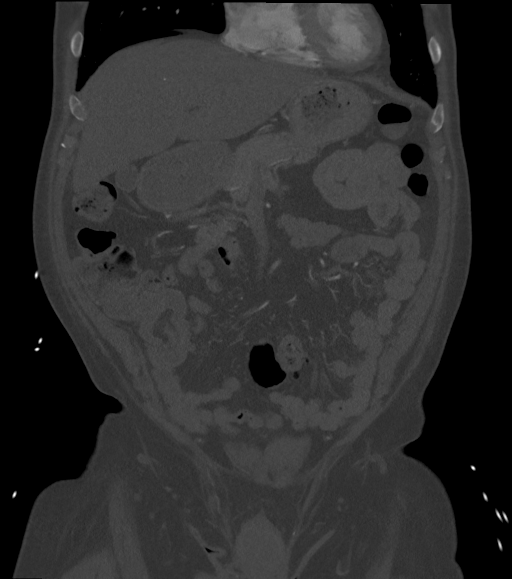
[im 91/136  soft-tissue]
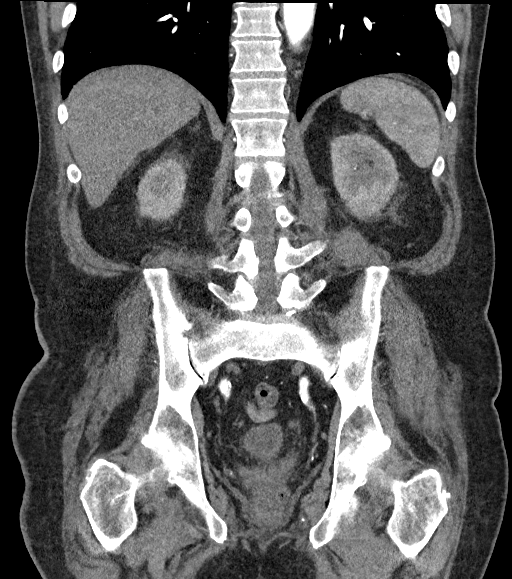

[Series 11: portal venous · axial · portal-venous · 0.73mm/px · z∈[+1068,+1328]mm · 4 of 88 slices shown, 9 images]
[im 18/88  soft-tissue]
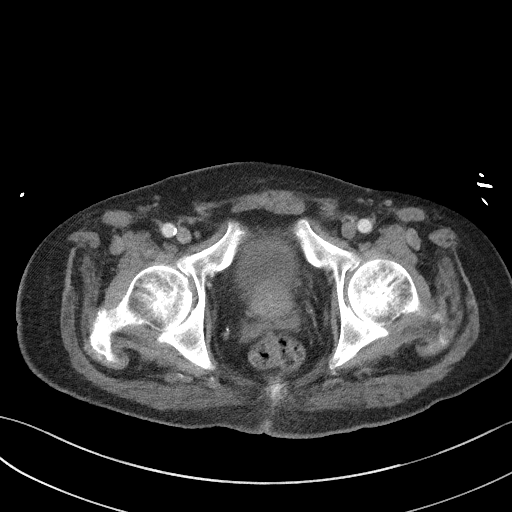
[im 18/88  lung]
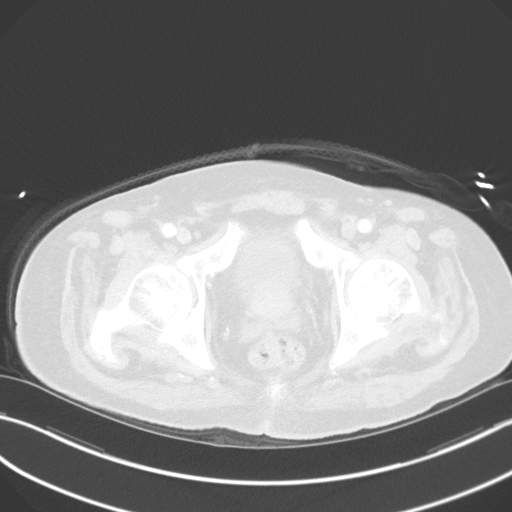
[im 18/88  bone]
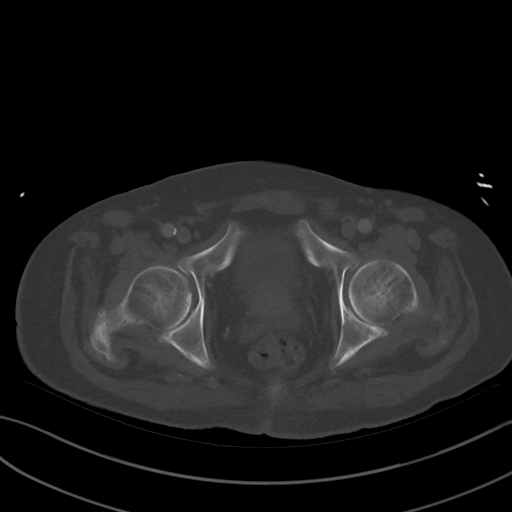
[im 35/88  soft-tissue]
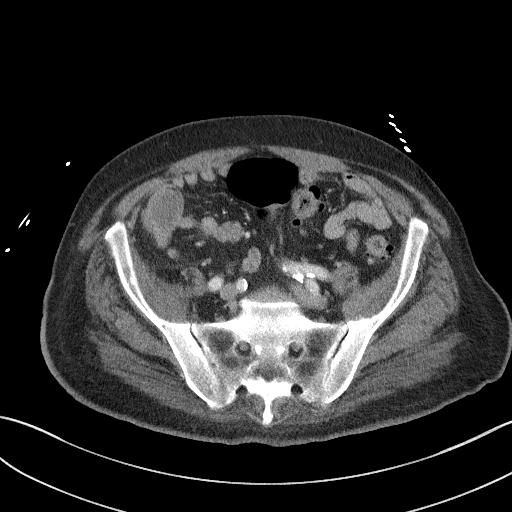
[im 35/88  lung]
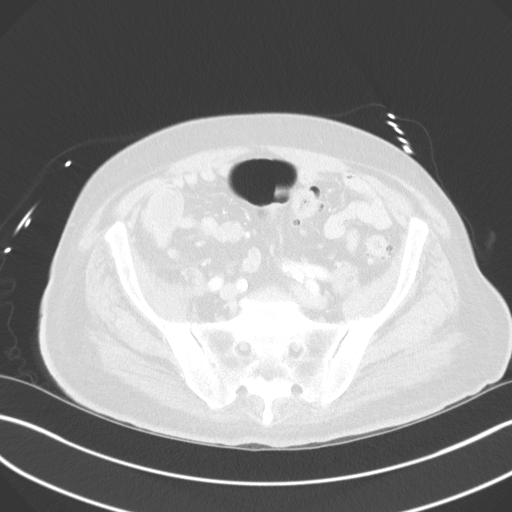
[im 53/88  soft-tissue]
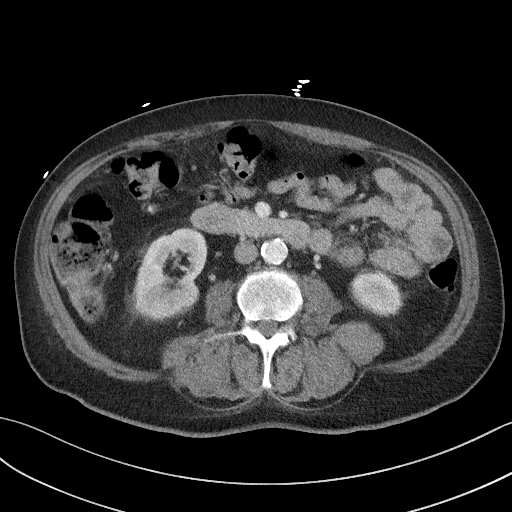
[im 53/88  lung]
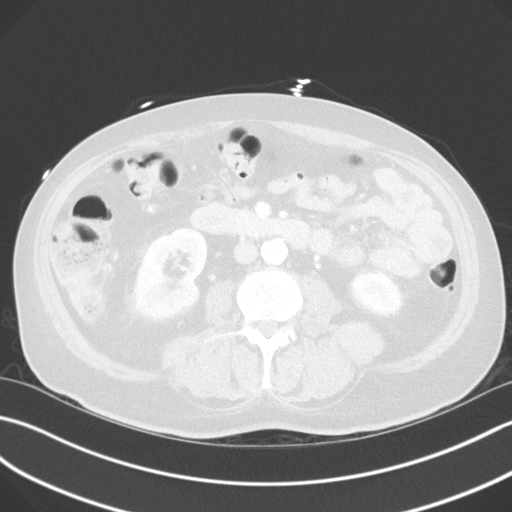
[im 70/88  soft-tissue]
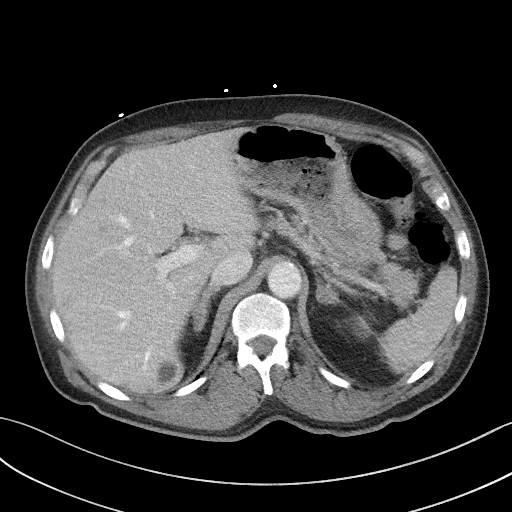
[im 70/88  lung]
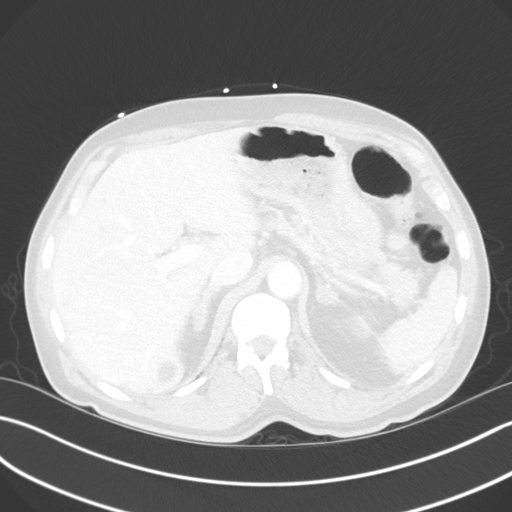

[10 of 46 positions shown; findings below may reference images not displayed]

RADIATION DOSE REDUCTION: This exam was performed according to the
departmental dose-optimization program which includes automated
exposure control, adjustment of the mA and/or kV according to
patient size and/or use of iterative reconstruction technique.

CONTRAST:  75mL OMNIPAQUE IOHEXOL 350 MG/ML SOLN
FINDINGS: VASCULAR

Aorta: Atherosclerotic calcifications in the abdominal aorta without
aneurysm, dissection or significant stenosis.

Celiac: Variant anatomy with celiac trunk supplying the left gastric
artery and the splenic artery. Celiac trunk is widely patent without
aneurysm, dissection significant stenosis.

SMA: SMA is widely patent. The common hepatic artery originates from
the proximal SMA and this is a variant. Main SMA branches are
patent.

Renals: Both renal arteries are patent without evidence of aneurysm,
dissection, vasculitis, fibromuscular dysplasia or significant
stenosis.

IMA: Patent without evidence of aneurysm, dissection, vasculitis or
significant stenosis.

Inflow: Patent without evidence of aneurysm, dissection, vasculitis
or significant stenosis.

Proximal Outflow: Proximal femoral arteries are patent bilaterally.

Veins: Limited evaluation of the venous structures due to the timing
of the study. Main portal venous system and renal veins are patent.

Review of the MIP images confirms the above findings.

NON-VASCULAR

Lower chest: Lung bases are clear.

Hepatobiliary: Multiple hypodensities in the liver. Largest
hypodensity is compatible with a cyst measuring up to 2.4 cm in the
posterior right hepatic lobe. However, many of these hypodensities
are small and too dense to be characterized as simple cysts. There
is a questionable lesion in the subcapsular region of the right
hepatic lobe on sequence 11 image 20 that measures roughly 1.4 cm.
Normal appearance of the gallbladder. No biliary dilatation.

Pancreas: Unremarkable. No pancreatic ductal dilatation or
surrounding inflammatory changes.

Spleen: Normal in size without focal abnormality.

Adrenals/Urinary Tract: Adrenal thickening bilaterally. Findings
could represent adrenal hyperplasia. 8 mm calculus in the right
kidney lower pole with small amount of adjacent gas. Gas is isolated
to this stone region and suspect that the stone is obstructing a
small calyx which contains gas. No other signs for infection or
inflammatory changes in the right kidney. No other urinary calculi.
Low-density structure in the right kidney interpolar region is
suggestive for cyst. Cysts in left kidney lower pole. Negative for
hydronephrosis. Normal appearance of the urinary bladder.

Stomach/Bowel: Normal appearance of the stomach and duodenum. Normal
caliber of the small bowel without obstruction. Multiple diverticula
involving the sigmoid colon without evidence for active bleeding or
acute inflammation. Normal appendix in the right lower quadrant.
Right colon and cecum are slightly redundant which makes
interpretation of this area difficult but no evidence for active GI
bleeding in this area. No clear evidence for a bowel lesion.

Lymphatic: Mildly prominent lymph nodes in the posterior lower
mediastinum adjacent to the descending thoracic aorta measuring 8 mm
in the short axis on sequence 11 image 8. Otherwise, there is no
significant lymph node enlargement in the abdomen or pelvis.

Reproductive: Prostate is unremarkable.

Other: Negative for ascites.  Negative for free air.

Musculoskeletal: No acute bone abnormality.
IMPRESSION: VASCULAR

1. Atherosclerotic disease in the abdomen and pelvis without
significant stenosis.
2. Variant visceral artery anatomy with the common hepatic artery
originating from the SMA.
3. No evidence for active GI bleeding.

NON-VASCULAR

1. Multiple small hypodensities throughout the liver. These could
represent multiple small cysts but too small to definitively
characterize. There is a questionable lesion in the right
subcapsular region measuring up to 1.4 cm. Based on the history of
recent weight loss, recommend further characterization with an
abdominal MRI, with and without contrast, to a exclude neoplastic
process.
2. 8 mm calculus in the right kidney lower pole with small amount of
adjacent gas. The gas raises concern for localized infection in this
area. Overall, no significant inflammatory changes involving the
right kidney. Negative for hydronephrosis.
3. Extensive colonic diverticulosis but no evidence for acute
colonic inflammation.

These results were called by telephone at the time of interpretation
on [DATE] at [DATE] to provider BURLACHENKO , who verbally
acknowledged these results.

## 2021-04-24 MED ORDER — LACTATED RINGERS IV SOLN: INTRAVENOUS | Status: AC

## 2021-04-24 MED ORDER — INSULIN ASPART 100 UNIT/ML IJ SOLN
0.0000 [IU] | Freq: Three times a day (TID) | INTRAMUSCULAR | Status: DC
  Administered 2021-04-26: 3 [IU] via SUBCUTANEOUS

## 2021-04-24 MED ORDER — ACETAMINOPHEN 325 MG PO TABS: 650.0000 mg | ORAL_TABLET | Freq: Four times a day (QID) | ORAL | Status: DC | PRN

## 2021-04-24 MED ORDER — SODIUM CHLORIDE 0.9 % IV BOLUS
1000.0000 mL | Freq: Once | INTRAVENOUS | Status: AC
  Administered 2021-04-24: 1000 mL via INTRAVENOUS

## 2021-04-24 MED ORDER — CEFTRIAXONE SODIUM 1 G IJ SOLR
1.0000 g | Freq: Once | INTRAMUSCULAR | Status: AC
  Administered 2021-04-24: 1 g via INTRAVENOUS
  Filled 2021-04-24: qty 10

## 2021-04-24 MED ORDER — HYDRALAZINE HCL 20 MG/ML IJ SOLN: 10.0000 mg | INTRAMUSCULAR | Status: DC | PRN

## 2021-04-24 MED ORDER — PANTOPRAZOLE SODIUM 40 MG IV SOLR
40.0000 mg | Freq: Once | INTRAVENOUS | Status: AC
  Administered 2021-04-24: 40 mg via INTRAVENOUS
  Filled 2021-04-24: qty 10

## 2021-04-24 MED ORDER — IOHEXOL 350 MG/ML SOLN
100.0000 mL | Freq: Once | INTRAVENOUS | Status: AC | PRN
  Administered 2021-04-24: 75 mL via INTRAVENOUS

## 2021-04-24 MED ORDER — ACETAMINOPHEN 650 MG RE SUPP: 650.0000 mg | Freq: Four times a day (QID) | RECTAL | Status: DC | PRN

## 2021-04-24 NOTE — H&P (Signed)
History and Physical    Timothy Davenport OFB:[id number] DOB: Sep 29, 1950 DOA: 04/24/2021  PCP: Darnelle Catalan, PA  Patient coming from: Home.  Chief Complaint: Lower GI bleed.  HPI: Timothy Davenport is a 71 y.o. male with history of thoracic myelopathy neurogenic bladder self cath, hypertension, diabetes mellitus, chronic anemia prior history of diverticulosis seen in colonoscopy done in 2016 had experienced over the last 3 days maroon-colored stools.  Denies any abdominal pain nausea or vomiting.  ED Course: In the ER CT angiogram of the abdomen pelvis does not show any active bleed.  Hemoglobin is around 9 which is almost around the baseline.  In addition CT scan also showed hepatic lesions which will need further work-up.  Also showed right renal stone with some gas with UA concerning for UTI was started on ceftriaxone.  ER physician had discussed with on-call gastroenterologist Dr. Collene Mares admitted for further GI work-up.  COVID test negative.  Review of Systems: As per HPI, rest all negative.   Past Medical History:  Diagnosis Date   Colon polyp    Diabetes mellitus without complication (HCC)    Diabetic neuropathy (HCC)    HTN (hypertension)    Patella fracture    Renal calculi     Past Surgical History:  Procedure Laterality Date   LUMBAR LAMINECTOMY/DECOMPRESSION MICRODISCECTOMY N/A 07/01/2019   Procedure: LUMBAR LAMINECTOMY/DECOMPRESSION MICRODISCECTOMY 1 LEVEL, THORACIC SIX-SEVEN;  Surgeon: Consuella Lose, MD;  Location: [facility];  Service: Neurosurgery;  Laterality: N/A;  LUMBAR LAMINECTOMY/DECOMPRESSION MICRODISCECTOMY 1 LEVEL, THORACIC SIX-SEVEN    ORIF PATELLA     THORACIC DISCECTOMY N/A 07/03/2019   Procedure: Evacuation of Thoracic Hematoma;  Surgeon: Consuella Lose, MD;  Location: [facility];  Service: Neurosurgery;  Laterality: N/A;     reports that he has never smoked. He has never used smokeless tobacco. He reports current alcohol use. He reports current drug use.  Drug: Marijuana.  Allergies  Allergen Reactions   Lisinopril     Other reaction(s): Angioedema   Enalapril-Hctz [Enalapril-Hydrochlorothiazide] Rash    Family History  Problem Relation Age of Onset   Hypertension Mother    Hypertension Father     Prior to Admission medications   Medication Sig Start Date End Date Taking? Authorizing Provider  acetaminophen (TYLENOL) 325 MG tablet Take 1-2 tablets (325-650 mg total) by mouth every 4 (four) hours as needed for mild pain. 07/20/19   Love, Ivan Anchors, PA-C  Alogliptin Benzoate 25 MG TABS Take 1 tablet by mouth daily with breakfast.    [provider]  ammonium lactate (AMLACTIN) 12 % cream Apply topically 2 (two) times daily. Apply to feet 01/11/20   [provider]  ammonium lactate (LAC-HYDRIN) 12 % lotion APPLY SMALL AMOUNT TO AFFECTED AREA DAILY 01/28/20   [provider]  aspirin EC 81 MG tablet Take 81 mg by mouth daily. Swallow whole.    [provider]  baclofen (LIORESAL) 20 MG tablet Take 1 tablet by mouth every 8 (eight) hours. 01/11/20   [provider]  bisacodyl (DULCOLAX) 10 MG suppository UNWRAP AND INSERT 1 SUPPOSITORY(10 MG) RECTALLY DAILY AFTER SUPPER 08/21/19   Raulkar, Clide Deutscher, MD  dantrolene (DANTRIUM) 100 MG capsule Take 1 capsule (100 mg total) by mouth 3 (three) times daily. 03/08/21   Lovorn, Jinny Blossom, MD  empagliflozin (JARDIANCE) 25 MG TABS tablet TAKE ONE-HALF TABLET BY MOUTH EVERY MORNING TO BE TAKEN IN PLACE OF SHORT-ACTING INSULIN. 10/20/20   [provider]  fluticasone (FLONASE) 50 MCG/ACT  nasal spray INSTILL 2 SPRAYS IN EACH NOSTRIL DAILY 02/10/20   [provider]  folic acid (FOLVITE) 1 MG tablet Take 1 mg by mouth daily. 01/11/20   [provider]  gabapentin (NEURONTIN) 300 MG capsule Take 600 mg by mouth every 8 (eight) hours. Do not stop abruptly    [provider]  gabapentin (NEURONTIN) 400 MG capsule TAKE TWO CAPSULES BY MOUTH  THREE TIMES A DAY 01/28/20   [provider]  glucose 4 GM chewable tablet Chew 4 tablets by mouth as needed for low blood sugar.    [provider]  glucose-Vitamin C 4-0.006 GM CHEW chewable tablet CHEW FOUR TABLETS BY MOUTH  AS NEEDED (REPEAT EVERY 15 MINUTES IF BLOOD SUGAR LESS THAN 70) 01/28/20   [provider]  insulin aspart (NOVOLOG) 100 UNIT/ML FlexPen INJECT 15 UNITS SUBCUTANEOUSLY BEFORE [facility] MEAL 06/14/20   [provider]  insulin glargine-yfgn (SEMGLEE) 100 UNIT/ML Pen INJECT 10 UNITS SUBCUTANEOUSLY DAILY FOR DIABETES. (CONVERTED FROM LANTUS) 06/13/20   [provider]  lidocaine (XYLOCAINE) 2 % jelly Apply 1 application topically at bedtime as needed (apply to rectum with digital stim).    [provider]  losartan (COZAAR) 50 MG tablet Take 50 mg by mouth daily.    [provider]  omeprazole (PRILOSEC) 20 MG capsule Take 20 mg by mouth daily before breakfast.    [provider]  oxybutynin (DITROPAN) 5 MG tablet Take 7.5 mg by mouth 2 (two) times daily.    [provider]  pioglitazone (ACTOS) 30 MG tablet Take 30 mg by mouth daily.    [provider]  Polyethylene Glycol 3350 POWD Take 1 Dose by mouth daily as needed. Take one capful by mouth every day in 8 ounces of liquid as needed    [provider]  polyvinyl alcohol (LIQUIFILM TEARS) 1.4 % ophthalmic solution Place 1 drop into both eyes 4 (four) times daily. 07/20/19   Love, Ivan Anchors, PA-C  senna-docusate (SENOKOT-S) 8.6-50 MG tablet TAKE 2 TABLETS BY MOUTH IN THE MORNING 10/20/20   [provider]  sertraline (ZOLOFT) 100 MG tablet Take 100 mg by mouth daily.    [provider]  sildenafil (VIAGRA) 100 MG tablet Take 100 mg by mouth daily as needed for erectile dysfunction. Take one hour prior to sexual activity **Do NOT exceed 1 dose per 24 hour period**    [provider]  simvastatin (ZOCOR) 20 MG tablet  Take 20 mg by mouth daily at 6 PM.    [provider]    Physical Exam: Constitutional: Moderately built and nourished. Vitals:   04/24/21 1400 04/24/21 1700 04/24/21 1808 04/24/21 1943  BP: 101/73 115/75 132/89 118/70  Pulse: (!) 57 60 61 (!) 50  Resp: (!) '23 16 17 18  '$ Temp:   98 F (36.7 C) (!) 97.5 F (36.4 C)  TempSrc:   Oral Oral  SpO2: 100% 100% 95% 98%  Weight:      Height:       Eyes: Anicteric no pallor. ENMT: No discharge from the ears eyes nose and mouth. Neck: No mass felt.  No neck rigidity. Respiratory: No rhonchi or crepitations. Cardiovascular: S1-S2 heard. Abdomen: Soft nontender bowel sound present. Musculoskeletal: No edema. Skin: No rash. Neurologic: Alert awake oriented time place and person.  Has weakness of the extremities particularly lower but able to move patient ambulates with help of walker. Psychiatric: Appears normal per normal affect.   Labs on Admission:  I have personally reviewed following labs and imaging studies  CBC: Recent Labs  Lab 04/24/21 0921  WBC 10.4  HGB 9.2*  HCT 30.1*  MCV 78.4*  PLT 001   Basic Metabolic Panel: Recent Labs  Lab 04/24/21 0921  NA 137  K 4.1  CL 104  CO2 26  GLUCOSE 141*  BUN 37*  CREATININE 1.40*  CALCIUM 9.6   GFR: Estimated Creatinine Clearance: 46.8 mL/min (A) (by C-G formula based on SCr of 1.4 mg/dL (H)). Liver Function Tests: Recent Labs  Lab 04/24/21 0921  AST 12*  ALT 11  ALKPHOS 51  BILITOT 0.3  PROT 7.3  ALBUMIN 3.5   No results for input(s): LIPASE, AMYLASE in the last 168 hours. No results for input(s): AMMONIA in the last 168 hours. Coagulation Profile: Recent Labs  Lab 04/24/21 1410  INR 1.0   Cardiac Enzymes: No results for input(s): CKTOTAL, CKMB, CKMBINDEX, TROPONINI in the last 168 hours. BNP (last 3 results) No results for input(s): PROBNP in the last 8760 hours. HbA1C: No results for input(s): HGBA1C in the last 72 hours. CBG: No results for  input(s): GLUCAP in the last 168 hours. Lipid Profile: No results for input(s): CHOL, HDL, LDLCALC, TRIG, CHOLHDL, LDLDIRECT in the last 72 hours. Thyroid Function Tests: No results for input(s): TSH, T4TOTAL, FREET4, T3FREE, THYROIDAB in the last 72 hours. Anemia Panel: No results for input(s): VITAMINB12, FOLATE, FERRITIN, TIBC, IRON, RETICCTPCT in the last 72 hours. Urine analysis:    Component Value Date/Time   COLORURINE YELLOW 04/24/2021 1343   APPEARANCEUR HAZY (A) 04/24/2021 1343   LABSPEC 1.037 (H) 04/24/2021 1343   PHURINE 5.5 04/24/2021 1343   GLUCOSEU 500 (A) 04/24/2021 1343   HGBUR NEGATIVE 04/24/2021 1343   BILIRUBINUR NEGATIVE 04/24/2021 1343   KETONESUR NEGATIVE 04/24/2021 1343   PROTEINUR NEGATIVE 04/24/2021 1343   NITRITE POSITIVE (A) 04/24/2021 1343   LEUKOCYTESUR LARGE (A) 04/24/2021 1343   Sepsis Labs: '@LABRCNTIP'$ (procalcitonin:4,lacticidven:4) ) Recent Results (from the past 240 hour(s))  Resp Panel by RT-PCR (Flu A&B, Covid) Nasopharyngeal Swab     Status: None   Collection Time: 04/24/21  2:10 PM   Specimen: Nasopharyngeal Swab; Nasopharyngeal(NP) swabs in vial transport medium  Result Value Ref Range Status   SARS Coronavirus 2 by RT PCR NEGATIVE NEGATIVE Final    Comment: (NOTE) SARS-CoV-2 target nucleic acids are NOT DETECTED.  The SARS-CoV-2 RNA is generally detectable in upper respiratory specimens during the acute phase of infection. The lowest concentration of SARS-CoV-2 viral copies this assay can detect is 138 copies/mL. A negative result does not preclude SARS-Cov-2 infection and should not be used as the sole basis for treatment or other patient management decisions. A negative result may occur with  improper specimen collection/handling, submission of specimen other than nasopharyngeal swab, presence of viral mutation(s) within the areas targeted by this assay, and inadequate number of viral copies(<138 copies/mL). A negative result must  be combined with clinical observations, patient history, and epidemiological information. The expected result is Negative.  Fact Sheet for Patients:  EntrepreneurPulse.com.au  Fact Sheet for Healthcare Providers:  IncredibleEmployment.be  This test is no t yet approved or cleared by the [place] FDA and  has been authorized for detection and/or diagnosis of SARS-CoV-2 by FDA under an Emergency Use Authorization (EUA). This EUA will remain  in effect (meaning this test can be used) for the duration of the COVID-19 declaration under Section 564(b)(1) of the Act, 21 U.S.C.section 360bbb-3(b)(1), unless the authorization is  terminated  or revoked sooner.       Influenza A by PCR NEGATIVE NEGATIVE Final   Influenza B by PCR NEGATIVE NEGATIVE Final    Comment: (NOTE) The Xpert Xpress SARS-CoV-2/FLU/RSV plus assay is intended as an aid in the diagnosis of influenza from Nasopharyngeal swab specimens and should not be used as a sole basis for treatment. Nasal washings and aspirates are unacceptable for Xpert Xpress SARS-CoV-2/FLU/RSV testing.  Fact Sheet for Patients: EntrepreneurPulse.com.au  Fact Sheet for Healthcare Providers: IncredibleEmployment.be  This test is not yet approved or cleared by the [place] FDA and has been authorized for detection and/or diagnosis of SARS-CoV-2 by FDA under an Emergency Use Authorization (EUA). This EUA will remain in effect (meaning this test can be used) for the duration of the COVID-19 declaration under Section 564(b)(1) of the Act, 21 U.S.C. section 360bbb-3(b)(1), unless the authorization is terminated or revoked.  Performed at [facility], [address], [place], [name] [address]      Radiological Exams on Admission: CT Angio Abd/Pel W and/or Wo Contrast  Result Date: 04/24/2021 CLINICAL DATA:  Lower GI bleed.  30 pound weight  loss in 3 months. EXAM: CTA ABDOMEN AND PELVIS WITHOUT AND WITH CONTRAST TECHNIQUE: Multidetector CT imaging of the abdomen and pelvis was performed using the standard protocol during bolus administration of intravenous contrast. Multiplanar reconstructed images and MIPs were obtained and reviewed to evaluate the vascular anatomy. RADIATION DOSE REDUCTION: This exam was performed according to the departmental dose-optimization program which includes automated exposure control, adjustment of the mA and/or kV according to patient size and/or use of iterative reconstruction technique. CONTRAST:  77m OMNIPAQUE IOHEXOL 350 MG/ML SOLN COMPARISON:  None. FINDINGS: VASCULAR Aorta: Atherosclerotic calcifications in the abdominal aorta without aneurysm, dissection or significant stenosis. Celiac: Variant anatomy with celiac trunk supplying the left gastric artery and the splenic artery. Celiac trunk is widely patent without aneurysm, dissection significant stenosis. SMA: SMA is widely patent. The common hepatic artery originates from the proximal SMA and this is a variant. Main SMA branches are patent. Renals: Both renal arteries are patent without evidence of aneurysm, dissection, vasculitis, fibromuscular dysplasia or significant stenosis. IMA: Patent without evidence of aneurysm, dissection, vasculitis or significant stenosis. Inflow: Patent without evidence of aneurysm, dissection, vasculitis or significant stenosis. Proximal Outflow: Proximal femoral arteries are patent bilaterally. Veins: Limited evaluation of the venous structures due to the timing of the study. Main portal venous system and renal veins are patent. Review of the MIP images confirms the above findings. NON-VASCULAR Lower chest: Lung bases are clear. Hepatobiliary: Multiple hypodensities in the liver. Largest hypodensity is compatible with a cyst measuring up to 2.4 cm in the posterior right hepatic lobe. However, many of these hypodensities are small  and too dense to be characterized as simple cysts. There is a questionable lesion in the subcapsular region of the right hepatic lobe on sequence 11 image 20 that measures roughly 1.4 cm. Normal appearance of the gallbladder. No biliary dilatation. Pancreas: Unremarkable. No pancreatic ductal dilatation or surrounding inflammatory changes. Spleen: Normal in size without focal abnormality. Adrenals/Urinary Tract: Adrenal thickening bilaterally. Findings could represent adrenal hyperplasia. 8 mm calculus in the right kidney lower pole with small amount of adjacent gas. Gas is isolated to this stone region and suspect that the stone is obstructing a small calyx which contains gas. No other signs for infection or inflammatory changes in the right kidney. No other urinary calculi. Low-density structure in the right kidney interpolar  region is suggestive for cyst. Cysts in left kidney lower pole. Negative for hydronephrosis. Normal appearance of the urinary bladder. Stomach/Bowel: Normal appearance of the stomach and duodenum. Normal caliber of the small bowel without obstruction. Multiple diverticula involving the sigmoid colon without evidence for active bleeding or acute inflammation. Normal appendix in the right lower quadrant. Right colon and cecum are slightly redundant which makes interpretation of this area difficult but no evidence for active GI bleeding in this area. No clear evidence for a bowel lesion. Lymphatic: Mildly prominent lymph nodes in the posterior lower mediastinum adjacent to the descending thoracic aorta measuring 8 mm in the short axis on sequence 11 image 8. Otherwise, there is no significant lymph node enlargement in the abdomen or pelvis. Reproductive: Prostate is unremarkable. Other: Negative for ascites.  Negative for free air. Musculoskeletal: No acute bone abnormality. IMPRESSION: VASCULAR 1. 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 2. Variant  visceral artery anatomy with the common hepatic artery originating from the SMA. 3. No evidence for active GI bleeding. NON-VASCULAR 1. Multiple small hypodensities throughout the liver. These could represent multiple small cysts but too small to definitively characterize. There is a questionable lesion in the right subcapsular region measuring up to 1.4 cm. Based on the history of recent weight loss, recommend further characterization with an abdominal MRI, with and without contrast, to a exclude neoplastic process. 2. 8 mm calculus in the right kidney lower pole with small amount of adjacent gas. The gas raises concern for localized infection in this area. Overall, no significant inflammatory changes involving the right kidney. Negative for hydronephrosis. 3. Extensive colonic diverticulosis but no evidence for acute colonic inflammation. These results were called by telephone at the time of interpretation on 04/24/2021 at 1:15 pm to provider [facility] , who verbally acknowledged these results. Electronically Signed   By: Markus Daft M.D.   On: 04/24/2021 13:19      Assessment/Plan Principal Problem:   Lower GI bleed Active Problems:   Thoracic myelopathy   Paraplegia (HCC)   Anemia   Essential hypertension   Diabetes mellitus type 2 in nonobese (HCC)   Acute GI bleeding    Acute lower GI bleed likely from diverticulosis has had colonoscopy in 2016 which showed severe diverticulosis.  We will hold patient aspirin and check serial CBCs.  Transfuse if hemoglobin less than 7.  Gastroenterologist Dr. Collene Mares was consulted. Renal stone with gas with possible UTI on ceftriaxone.  Follow cultures. Abnormal hepatic lesions seen in the CAT scan will need further work-up. History of diabetes mellitus type 2 takes Lantus 8 units for now we will keep patient on sliding scale coverage. Hypertension needs to verify home medication we will keep patient.  IV hydralazine. History of thoracic myelopathy takes  muscle relaxant does have to be verified. Chronic kidney disease stage III creatinine is improved when compared to previous.  Follow metabolic panel. Neurogenic bladder self cath.   DVT prophylaxis: SCDs.  Avoiding anticoagulation in the setting of GI bleed. Code Status: Full code. Family Communication: Discussed with patient. Disposition Plan: Home when stable. Consults called: Gastroenterologist. Admission status: Observation.   Rise Patience MD [facility] Pager [phone].  If 7PM-7AM, please contact night-coverage [email or web address] Password [facility]  04/24/2021, 7:58 PM

## 2021-04-24 NOTE — ED Notes (Signed)
Report given to the RN on the floor. ?

## 2021-04-24 NOTE — Progress Notes (Signed)
Plan of Care Note for accepted transfer ? ? ?Patient: Timothy Davenport MRN: [id number]   What Cheer: 04/24/2021 ? ?Facility requesting transfer: [facility] ?Requesting Provider: Dr. Melina Copa ?Reason for transfer: GI bleed  ?Facility course: 71 y.o male hx of neurogenic bowel and bladder, paraplegia, thoracic myelopathy, hx of CVA, CKD stage III, depression, HTN, HLD, T2DM, who presented to ED with complaints of big maroon bowel movement x 2. One yesterday and one today. Painless BM. +pic by family that was gross blood per edp.  ? ?Cscope 2016. Severe diverticulosis of entire colon. Polyp removed.  ? ?Vitals: afebrile, BP: 103/62, HR: 52, RR; 16, oxygen: 100%RA ?Pertinent labs: hgb 9.2 (10-11), creatinine: 1.4 (1.3 a month ago)  BUN 37, UA+ nitrite/large LE, ?CTA abdo/pelvis: no active bleed.  ?Liver findings of questionable lesion up to 1.4cm. recommend abdominal MRI w/wout contrast.  ?54m calculus in right kidney lower pole with small amount of adjacent gas.   ? ?Given dose of rocephin. Okay for clear liquid diet. Asked to give protonix. Already discussed with Dr. MCollene Maresfor GI. Will need to let them know when he arrives.  ? ?Plan of care: ?[facility]  ? ? ?Author: ?AOrma Flaming MD ?04/24/2021 ? ?Check [email or web address] for on-call coverage. ? ?Nursing staff, Please call [place] Amion as soon as patient's arrival, so appropriate admitting provider can evaluate the pt. ?

## 2021-04-24 NOTE — ED Notes (Signed)
Hemocult card placed at bedside. ?

## 2021-04-24 NOTE — ED Notes (Signed)
Provider discontinued the Occult Card. ?

## 2021-04-24 NOTE — ED Provider Notes (Signed)
[facility] EMERGENCY DEPT Provider Note   CSN: [id number] Arrival date & time: 04/24/21  0900     History  Chief Complaint  Patient presents with   Hematochezia    Timothy Davenport is a 71 y.o. male.  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  Mostly wheelchair dependent but can use a walker.  Complaining of 2 days of maroon stool.  No fevers chills nausea vomiting.  No abdominal pain.  Has felt somewhat dizzy and lightheaded.  Takes a daily aspirin but no other blood thinners.  Last colonoscopy greater than 10 to 15 years ago.  The history is provided by the patient and the spouse.  Rectal Bleeding Quality:  Maroon Amount:  Moderate Duration:  2 days Timing:  Sporadic Chronicity:  New Context: defecation   Similar prior episodes: no   Relieved by:  None tried Worsened by:  Defecation Ineffective treatments:  None tried Associated symptoms: dizziness and light-headedness   Associated symptoms: no abdominal pain, no epistaxis, no fever, no hematemesis, no loss of consciousness and no vomiting       Home Medications Prior to Admission medications   Medication Sig Start Date End Date Taking? Authorizing Provider  acetaminophen (TYLENOL) 325 MG tablet Take 1-2 tablets (325-650 mg total) by mouth every 4 (four) hours as needed for mild pain. 07/20/19   Love, Ivan Anchors, PA-C  Alogliptin Benzoate 25 MG TABS Take 1 tablet by mouth daily with breakfast.    [provider]  ammonium lactate (AMLACTIN) 12 % cream Apply topically 2 (two) times daily. Apply to feet 01/11/20   [provider]  ammonium lactate (LAC-HYDRIN) 12 % lotion APPLY SMALL AMOUNT TO AFFECTED AREA DAILY 01/28/20   [provider]  aspirin EC 81 MG tablet Take 81 mg by mouth daily. Swallow whole.    [provider]  baclofen (LIORESAL) 20 MG tablet Take 1 tablet by mouth every 8 (eight) hours. 01/11/20   [provider]  bisacodyl (DULCOLAX) 10 MG  suppository UNWRAP AND INSERT 1 SUPPOSITORY(10 MG) RECTALLY DAILY AFTER SUPPER 08/21/19   Raulkar, Clide Deutscher, MD  dantrolene (DANTRIUM) 100 MG capsule Take 1 capsule (100 mg total) by mouth 3 (three) times daily. 03/08/21   Lovorn, Jinny Blossom, MD  empagliflozin (JARDIANCE) 25 MG TABS tablet TAKE ONE-HALF TABLET BY MOUTH EVERY MORNING TO BE TAKEN IN PLACE OF SHORT-ACTING INSULIN. 10/20/20   [provider]  fluticasone (FLONASE) 50 MCG/ACT nasal spray INSTILL 2 SPRAYS IN EACH NOSTRIL DAILY 02/10/20   [provider]  folic acid (FOLVITE) 1 MG tablet Take 1 mg by mouth daily. 01/11/20   [provider]  gabapentin (NEURONTIN) 300 MG capsule Take 600 mg by mouth every 8 (eight) hours. Do not stop abruptly    [provider]  gabapentin (NEURONTIN) 400 MG capsule TAKE TWO CAPSULES BY MOUTH THREE TIMES A DAY 01/28/20   [provider]  glucose 4 GM chewable tablet Chew 4 tablets by mouth as needed for low blood sugar.    [provider]  glucose-Vitamin C 4-0.006 GM CHEW chewable tablet CHEW FOUR TABLETS BY MOUTH  AS NEEDED (REPEAT EVERY 15 MINUTES IF BLOOD SUGAR LESS THAN 70) 01/28/20   [provider]  insulin aspart (NOVOLOG) 100 UNIT/ML FlexPen INJECT 15 UNITS SUBCUTANEOUSLY BEFORE [facility] MEAL 06/14/20   [provider]  insulin glargine-yfgn (SEMGLEE) 100 UNIT/ML Pen INJECT 10 UNITS SUBCUTANEOUSLY DAILY FOR DIABETES. (CONVERTED FROM LANTUS) 06/13/20   [provider]  lidocaine (XYLOCAINE) 2 % jelly Apply 1 application topically at bedtime as needed (apply to rectum with digital stim).    [provider]  losartan (COZAAR) 50 MG tablet Take 50 mg by mouth daily.    [provider]  omeprazole (PRILOSEC) 20 MG capsule Take 20 mg by mouth daily before breakfast.    [provider]  oxybutynin (DITROPAN) 5 MG tablet Take 7.5 mg by mouth 2 (two) times daily.    [provider]  pioglitazone (ACTOS) 30 MG  tablet Take 30 mg by mouth daily.    [provider]  Polyethylene Glycol 3350 POWD Take 1 Dose by mouth daily as needed. Take one capful by mouth every day in 8 ounces of liquid as needed    [provider]  polyvinyl alcohol (LIQUIFILM TEARS) 1.4 % ophthalmic solution Place 1 drop into both eyes 4 (four) times daily. 07/20/19   Love, Ivan Anchors, PA-C  senna-docusate (SENOKOT-S) 8.6-50 MG tablet TAKE 2 TABLETS BY MOUTH IN THE MORNING 10/20/20   [provider]  sertraline (ZOLOFT) 100 MG tablet Take 100 mg by mouth daily.    [provider]  sildenafil (VIAGRA) 100 MG tablet Take 100 mg by mouth daily as needed for erectile dysfunction. Take one hour prior to sexual activity **Do NOT exceed 1 dose per 24 hour period**    [provider]  simvastatin (ZOCOR) 20 MG tablet Take 20 mg by mouth daily at 6 PM.    [provider]      Allergies    Lisinopril and Enalapril-hctz [enalapril-hydrochlorothiazide]    Review of Systems   Review of Systems  Constitutional:  Negative for fever.  HENT:  Negative for nosebleeds.   Eyes:  Negative for visual disturbance.  Respiratory:  Negative for shortness of breath.   Cardiovascular:  Negative for chest pain.  Gastrointestinal:  Positive for hematochezia. Negative for abdominal pain, hematemesis and vomiting.  Genitourinary:  Negative for dysuria.  Musculoskeletal:  Positive for gait problem.  Skin:  Negative for rash.  Neurological:  Positive for dizziness and light-headedness. Negative for loss of consciousness.   Physical Exam Updated Vital Signs BP 117/65 (BP Location: Left Arm)    Pulse (!) 56    Temp 97.6 F (36.4 C) (Temporal)    Resp 16    Ht '5\' 11"'$  (1.803 m)    Wt 69.4 kg    SpO2 100%    BMI 21.34 kg/m  Physical Exam Vitals and nursing note reviewed.  Constitutional:      General: He is not in acute distress.    Appearance: Normal appearance. He is well-developed.  HENT:     Head:  Normocephalic and atraumatic.  Eyes:     Conjunctiva/sclera: Conjunctivae normal.  Cardiovascular:     Rate and Rhythm: Normal rate and regular rhythm.     Heart sounds: No murmur heard. Pulmonary:     Effort: Pulmonary effort is normal. No respiratory distress.     Breath sounds: Normal breath sounds.  Abdominal:     Palpations: Abdomen is soft.     Tenderness: There is no abdominal tenderness. There is no guarding or rebound.  Musculoskeletal:        General: No swelling or deformity.     Cervical back: Neck supple.     Right lower leg: No edema.     Left lower leg: No edema.  Skin:    General: Skin is warm and dry.  Capillary Refill: Capillary refill takes less than 2 seconds.  Neurological:     Mental Status: He is alert. Mental status is at baseline.     Motor: Weakness present.     Comments: Patient has minimal use of lower extremities.  Psychiatric:        Mood and Affect: Mood normal.    ED Results / Procedures / Treatments   Labs (all labs ordered are listed, but only abnormal results are displayed) Labs Reviewed  COMPREHENSIVE METABOLIC PANEL - Abnormal; Notable for the following components:      Result Value   Glucose, Bld 141 (*)    BUN 37 (*)    Creatinine, Ser 1.40 (*)    AST 12 (*)    GFR, Estimated 53 (*)    All other components within normal limits  CBC - Abnormal; Notable for the following components:   RBC 3.84 (*)    Hemoglobin 9.2 (*)    HCT 30.1 (*)    MCV 78.4 (*)    MCH 24.0 (*)    RDW 18.2 (*)    All other components within normal limits  URINALYSIS, ROUTINE W REFLEX MICROSCOPIC - Abnormal; Notable for the following components:   APPearance HAZY (*)    Specific Gravity, Urine 1.037 (*)    Glucose, UA 500 (*)    Nitrite POSITIVE (*)    Leukocytes,Ua LARGE (*)    WBC, UA >50 (*)    Bacteria, UA MANY (*)    All other components within normal limits  RESP PANEL BY RT-PCR (FLU A&B, COVID) ARPGX2  URINE CULTURE  PROTIME-INR  OCCULT  BLOOD X 1 CARD TO LAB, STOOL    EKG None  Radiology CT Angio Abd/Pel W and/or Wo Contrast  Result Date: 04/24/2021 CLINICAL DATA:  Lower GI bleed.  30 pound weight loss in 3 months. EXAM: CTA ABDOMEN AND PELVIS WITHOUT AND WITH CONTRAST TECHNIQUE: Multidetector CT imaging of the abdomen and pelvis was performed using the standard protocol during bolus administration of intravenous contrast. Multiplanar reconstructed images and MIPs were obtained and reviewed to evaluate the vascular anatomy. RADIATION DOSE REDUCTION: This exam was performed according to the departmental dose-optimization program which includes automated exposure control, adjustment of the mA and/or kV according to patient size and/or use of iterative reconstruction technique. CONTRAST:  78m OMNIPAQUE IOHEXOL 350 MG/ML SOLN COMPARISON:  None. FINDINGS: VASCULAR Aorta: Atherosclerotic calcifications in the abdominal aorta without aneurysm, dissection or significant stenosis. Celiac: Variant anatomy with celiac trunk supplying the left gastric artery and the splenic artery. Celiac trunk is widely patent without aneurysm, dissection significant stenosis. SMA: SMA is widely patent. The common hepatic artery originates from the proximal SMA and this is a variant. Main SMA branches are patent. Renals: Both renal arteries are patent without evidence of aneurysm, dissection, vasculitis, fibromuscular dysplasia or significant stenosis. IMA: Patent without evidence of aneurysm, dissection, vasculitis or significant stenosis. Inflow: Patent without evidence of aneurysm, dissection, vasculitis or significant stenosis. Proximal Outflow: Proximal femoral arteries are patent bilaterally. Veins: Limited evaluation of the venous structures due to the timing of the study. Main portal venous system and renal veins are patent. Review of the MIP images confirms the above findings. NON-VASCULAR Lower chest: Lung bases are clear. Hepatobiliary: Multiple  hypodensities in the liver. Largest hypodensity is compatible with a cyst measuring up to 2.4 cm in the posterior right hepatic lobe. However, many of these hypodensities are small and too dense to be characterized as simple cysts. There  is a questionable lesion in the subcapsular region of the right hepatic lobe on sequence 11 image 20 that measures roughly 1.4 cm. Normal appearance of the gallbladder. No biliary dilatation. Pancreas: Unremarkable. No pancreatic ductal dilatation or surrounding inflammatory changes. Spleen: Normal in size without focal abnormality. Adrenals/Urinary Tract: Adrenal thickening bilaterally. Findings could represent adrenal hyperplasia. 8 mm calculus in the right kidney lower pole with small amount of adjacent gas. Gas is isolated to this stone region and suspect that the stone is obstructing a small calyx which contains gas. No other signs for infection or inflammatory changes in the right kidney. No other urinary calculi. Low-density structure in the right kidney interpolar region is suggestive for cyst. Cysts in left kidney lower pole. Negative for hydronephrosis. Normal appearance of the urinary bladder. Stomach/Bowel: Normal appearance of the stomach and duodenum. Normal caliber of the small bowel without obstruction. Multiple diverticula involving the sigmoid colon without evidence for active bleeding or acute inflammation. Normal appendix in the right lower quadrant. Right colon and cecum are slightly redundant which makes interpretation of this area difficult but no evidence for active GI bleeding in this area. No clear evidence for a bowel lesion. Lymphatic: Mildly prominent lymph nodes in the posterior lower mediastinum adjacent to the descending thoracic aorta measuring 8 mm in the short axis on sequence 11 image 8. Otherwise, there is no significant lymph node enlargement in the abdomen or pelvis. Reproductive: Prostate is unremarkable. Other: Negative for ascites.  Negative  for free air. Musculoskeletal: No acute bone abnormality. IMPRESSION: VASCULAR 1. 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 2. Variant visceral artery anatomy with the common hepatic artery originating from the SMA. 3. No evidence for active GI bleeding. NON-VASCULAR 1. Multiple small hypodensities throughout the liver. These could represent multiple small cysts but too small to definitively characterize. There is a questionable lesion in the right subcapsular region measuring up to 1.4 cm. Based on the history of recent weight loss, recommend further characterization with an abdominal MRI, with and without contrast, to a exclude neoplastic process. 2. 8 mm calculus in the right kidney lower pole with small amount of adjacent gas. The gas raises concern for localized infection in this area. Overall, no significant inflammatory changes involving the right kidney. Negative for hydronephrosis. 3. Extensive colonic diverticulosis but no evidence for acute colonic inflammation. These results were called by telephone at the time of interpretation on 04/24/2021 at 1:15 pm to provider [facility] , who verbally acknowledged these results. Electronically Signed   By: Markus Daft M.D.   On: 04/24/2021 13:19    Procedures Procedures    Medications Ordered in ED Medications  sodium chloride 0.9 % bolus 1,000 mL (0 mLs Intravenous Stopped 04/24/21 1354)  iohexol (OMNIPAQUE) 350 MG/ML injection 100 mL (75 mLs Intravenous Contrast Given 04/24/21 1224)  cefTRIAXone (ROCEPHIN) 1 g in sodium chloride 0.9 % 100 mL IVPB (0 g Intravenous Stopped 04/24/21 1515)  pantoprazole (PROTONIX) injection 40 mg (40 mg Intravenous Given 04/24/21 1521)    ED Course/ Medical Decision Making/ A&P Clinical Course as of 04/24/21 1714  Mon Apr 24, 2021  1346 Discussed with Dr. Collene Mares GI covering [facility].  She felt that the patient would benefit from an admission. [MB]  1428 Discussed with Dr. Rogers Blocker [facility]  hospitalist who will put the patient in for a bed at [facility] [MB]    Clinical Course User Index [MB] Hayden Rasmussen, MD  Medical Decision Making Amount and/or Complexity of Data Reviewed Labs: ordered. Radiology: ordered.  Risk Prescription drug management. Decision regarding hospitalization.  This patient complains of still bleeding dizziness; this involves an extensive number of treatment Options and is a complaint that carries with it a high risk of complications and morbidity. The differential includes upper GI bleed, lower GI bleed, diverticulitis, AVM, anemia,  I ordered, reviewed and interpreted labs, which included CBC with normal white count, hemoglobin slightly lower than priors, normal platelets, chemistries with elevated BUN and creatinine, COVID and flu negative, INR normal, urinalysis with possible signs of infection nitrite positive I ordered medication IV fluids and PPI, IV antibiotics and reviewed PMP when indicated. I ordered imaging studies which included CT abdomen and pelvis and I independently    visualized and interpreted imaging which showed no localization of possible GI bleed.  Does have multiple hepatic lesions and also gas and stone in right kidney Additional history obtained from patient's wife Previous records obtained and reviewed in epic no recent admissions I consulted Dr. Collene Mares GI and Dr. Rogers Blocker [facility] hospitalist and discussed lab and imaging findings and discussed disposition.  Cardiac monitoring reviewed, normal sinus rhythm intermittently sinus bradycardia Social determinants considered, no significant barriers Critical Interventions: None  After the interventions stated above, I reevaluated the patient and found patient to be hemodynamically stable. Admission and further testing considered, he will need admission to the hospital for further evaluation of his GI bleeding and possible urine infection.  Patient agreeable to  plan.           Final Clinical Impression(s) / ED Diagnoses Final diagnoses:  Lower GI bleed  Liver lesion  Urinary tract infection with hematuria, site unspecified    Rx / DC Orders ED Discharge Orders     None         Hayden Rasmussen, MD 04/24/21 [phone]

## 2021-04-24 NOTE — ED Triage Notes (Signed)
Pt arrives to ED with c/o bright red blood per rectum. This started x3 days ago and has not improved. Pt denies abd and rectal pain. Recent 30lbs unintended weight loss over the last three months. Last colonoscopy >4yr ago.   ?

## 2021-04-24 NOTE — ED Notes (Signed)
Pt was notified that he could only have clear liquids, no food. ?

## 2021-04-25 DIAGNOSIS — K769 Liver disease, unspecified: Secondary | ICD-10-CM

## 2021-04-25 DIAGNOSIS — Z794 Long term (current) use of insulin: Secondary | ICD-10-CM | POA: Diagnosis not present

## 2021-04-25 DIAGNOSIS — I129 Hypertensive chronic kidney disease with stage 1 through stage 4 chronic kidney disease, or unspecified chronic kidney disease: Secondary | ICD-10-CM | POA: Diagnosis present

## 2021-04-25 DIAGNOSIS — N390 Urinary tract infection, site not specified: Secondary | ICD-10-CM | POA: Diagnosis present

## 2021-04-25 DIAGNOSIS — E440 Moderate protein-calorie malnutrition: Secondary | ICD-10-CM | POA: Diagnosis present

## 2021-04-25 DIAGNOSIS — I10 Essential (primary) hypertension: Secondary | ICD-10-CM | POA: Diagnosis not present

## 2021-04-25 DIAGNOSIS — B9689 Other specified bacterial agents as the cause of diseases classified elsewhere: Secondary | ICD-10-CM | POA: Diagnosis present

## 2021-04-25 DIAGNOSIS — Z888 Allergy status to other drugs, medicaments and biological substances status: Secondary | ICD-10-CM | POA: Diagnosis not present

## 2021-04-25 DIAGNOSIS — D509 Iron deficiency anemia, unspecified: Secondary | ICD-10-CM | POA: Diagnosis present

## 2021-04-25 DIAGNOSIS — R319 Hematuria, unspecified: Secondary | ICD-10-CM

## 2021-04-25 DIAGNOSIS — E1122 Type 2 diabetes mellitus with diabetic chronic kidney disease: Secondary | ICD-10-CM | POA: Diagnosis present

## 2021-04-25 DIAGNOSIS — E1140 Type 2 diabetes mellitus with diabetic neuropathy, unspecified: Secondary | ICD-10-CM | POA: Diagnosis present

## 2021-04-25 DIAGNOSIS — G959 Disease of spinal cord, unspecified: Secondary | ICD-10-CM | POA: Diagnosis present

## 2021-04-25 DIAGNOSIS — N200 Calculus of kidney: Secondary | ICD-10-CM | POA: Diagnosis present

## 2021-04-25 DIAGNOSIS — Z87442 Personal history of urinary calculi: Secondary | ICD-10-CM | POA: Diagnosis not present

## 2021-04-25 DIAGNOSIS — Z8249 Family history of ischemic heart disease and other diseases of the circulatory system: Secondary | ICD-10-CM | POA: Diagnosis not present

## 2021-04-25 DIAGNOSIS — Z7982 Long term (current) use of aspirin: Secondary | ICD-10-CM | POA: Diagnosis not present

## 2021-04-25 DIAGNOSIS — K922 Gastrointestinal hemorrhage, unspecified: Secondary | ICD-10-CM | POA: Diagnosis not present

## 2021-04-25 DIAGNOSIS — Z20822 Contact with and (suspected) exposure to covid-19: Secondary | ICD-10-CM | POA: Diagnosis present

## 2021-04-25 DIAGNOSIS — Z6821 Body mass index (BMI) 21.0-21.9, adult: Secondary | ICD-10-CM | POA: Diagnosis not present

## 2021-04-25 DIAGNOSIS — G8220 Paraplegia, unspecified: Secondary | ICD-10-CM | POA: Diagnosis present

## 2021-04-25 DIAGNOSIS — N182 Chronic kidney disease, stage 2 (mild): Secondary | ICD-10-CM | POA: Diagnosis present

## 2021-04-25 DIAGNOSIS — Z993 Dependence on wheelchair: Secondary | ICD-10-CM | POA: Diagnosis not present

## 2021-04-25 DIAGNOSIS — Z86010 Personal history of colonic polyps: Secondary | ICD-10-CM | POA: Diagnosis not present

## 2021-04-25 DIAGNOSIS — E119 Type 2 diabetes mellitus without complications: Secondary | ICD-10-CM | POA: Diagnosis not present

## 2021-04-25 DIAGNOSIS — K5731 Diverticulosis of large intestine without perforation or abscess with bleeding: Secondary | ICD-10-CM | POA: Diagnosis present

## 2021-04-25 DIAGNOSIS — N319 Neuromuscular dysfunction of bladder, unspecified: Secondary | ICD-10-CM | POA: Diagnosis present

## 2021-04-25 DIAGNOSIS — K921 Melena: Secondary | ICD-10-CM | POA: Diagnosis present

## 2021-04-25 DIAGNOSIS — Z7984 Long term (current) use of oral hypoglycemic drugs: Secondary | ICD-10-CM | POA: Diagnosis not present

## 2021-04-25 LAB — CBC
HCT: 24.7 % — ABNORMAL LOW (ref 39.0–52.0)
HCT: 25.4 % — ABNORMAL LOW (ref 39.0–52.0)
HCT: 27.4 % — ABNORMAL LOW (ref 39.0–52.0)
HCT: 27.5 % — ABNORMAL LOW (ref 39.0–52.0)
Hemoglobin: 7.9 g/dL — ABNORMAL LOW (ref 13.0–17.0)
Hemoglobin: 8.1 g/dL — ABNORMAL LOW (ref 13.0–17.0)
Hemoglobin: 8.5 g/dL — ABNORMAL LOW (ref 13.0–17.0)
Hemoglobin: 8.6 g/dL — ABNORMAL LOW (ref 13.0–17.0)
MCH: 24.3 pg — ABNORMAL LOW (ref 26.0–34.0)
MCH: 24.6 pg — ABNORMAL LOW (ref 26.0–34.0)
MCH: 24.7 pg — ABNORMAL LOW (ref 26.0–34.0)
MCH: 25.1 pg — ABNORMAL LOW (ref 26.0–34.0)
MCHC: 31 g/dL (ref 30.0–36.0)
MCHC: 31.3 g/dL (ref 30.0–36.0)
MCHC: 31.9 g/dL (ref 30.0–36.0)
MCHC: 32 g/dL (ref 30.0–36.0)
MCV: 77.4 fL — ABNORMAL LOW (ref 80.0–100.0)
MCV: 78.3 fL — ABNORMAL LOW (ref 80.0–100.0)
MCV: 78.4 fL — ABNORMAL LOW (ref 80.0–100.0)
MCV: 78.6 fL — ABNORMAL LOW (ref 80.0–100.0)
Platelets: 194 10*3/uL (ref 150–400)
Platelets: 210 10*3/uL (ref 150–400)
Platelets: 213 10*3/uL (ref 150–400)
Platelets: 214 10*3/uL (ref 150–400)
RBC: 3.15 MIL/uL — ABNORMAL LOW (ref 4.22–5.81)
RBC: 3.28 MIL/uL — ABNORMAL LOW (ref 4.22–5.81)
RBC: 3.5 MIL/uL — ABNORMAL LOW (ref 4.22–5.81)
RBC: 3.5 MIL/uL — ABNORMAL LOW (ref 4.22–5.81)
RDW: 17.8 % — ABNORMAL HIGH (ref 11.5–15.5)
RDW: 17.9 % — ABNORMAL HIGH (ref 11.5–15.5)
RDW: 17.9 % — ABNORMAL HIGH (ref 11.5–15.5)
RDW: 18 % — ABNORMAL HIGH (ref 11.5–15.5)
WBC: 7.4 10*3/uL (ref 4.0–10.5)
WBC: 7.8 10*3/uL (ref 4.0–10.5)
WBC: 7.9 10*3/uL (ref 4.0–10.5)
WBC: 9.2 10*3/uL (ref 4.0–10.5)
nRBC: 0 % (ref 0.0–0.2)
nRBC: 0 % (ref 0.0–0.2)
nRBC: 0 % (ref 0.0–0.2)
nRBC: 0 % (ref 0.0–0.2)

## 2021-04-25 LAB — BASIC METABOLIC PANEL
Anion gap: 8 (ref 5–15)
BUN: 22 mg/dL (ref 8–23)
CO2: 24 mmol/L (ref 22–32)
Calcium: 9.4 mg/dL (ref 8.9–10.3)
Chloride: 110 mmol/L (ref 98–111)
Creatinine, Ser: 1.19 mg/dL (ref 0.61–1.24)
GFR, Estimated: >60 mL/min (ref >60)
Glucose, Bld: 104 mg/dL — ABNORMAL HIGH (ref 70–99)
Potassium: 4.2 mmol/L (ref 3.5–5.1)
Sodium: 142 mmol/L (ref 135–145)

## 2021-04-25 LAB — GLUCOSE, CAPILLARY
Glucose-Capillary: 89 mg/dL (ref 70–99)
Glucose-Capillary: 115 mg/dL — ABNORMAL HIGH (ref 70–99)
Glucose-Capillary: 131 mg/dL — ABNORMAL HIGH (ref 70–99)
Glucose-Capillary: 110 mg/dL — ABNORMAL HIGH (ref 70–99)
Glucose-Capillary: 147 mg/dL — ABNORMAL HIGH (ref 70–99)

## 2021-04-25 MED ORDER — ADULT MULTIVITAMIN W/MINERALS CH
1.0000 | ORAL_TABLET | Freq: Every day | ORAL | Status: DC
  Administered 2021-04-25 – 2021-04-26 (×2): 1 via ORAL
  Filled 2021-04-25 (×2): qty 1

## 2021-04-25 MED ORDER — BOOST / RESOURCE BREEZE PO LIQD CUSTOM
1.0000 | Freq: Three times a day (TID) | ORAL | Status: DC
  Administered 2021-04-25 – 2021-04-26 (×3): 1 via ORAL

## 2021-04-25 MED ORDER — SODIUM CHLORIDE 0.9 % IV SOLN
1.0000 g | INTRAVENOUS | Status: DC
  Administered 2021-04-25 – 2021-04-26 (×2): 1 g via INTRAVENOUS
  Filled 2021-04-25 (×2): qty 10

## 2021-04-25 NOTE — Progress Notes (Signed)
PROGRESS NOTE    Timothy Davenport  QRF:[id number] DOB: [birth year] DOA: 04/24/2021 PCP: Timothy Catalan, PA    Chief Complaint  Patient presents with   Hematochezia    Brief Narrative:    Timothy Davenport is a 71 y.o. male with history of thoracic myelopathy neurogenic bladder self cath, hypertension, diabetes mellitus, chronic anemia prior history of diverticulosis seen in colonoscopy done in 2016 had experienced over the last 3 days maroon-colored stools.  Denies any abdominal pain nausea or vomiting.   ED Course: In the ER CT angiogram of the abdomen pelvis does not show any active bleed.  Hemoglobin is around 9 which is almost around the baseline.  In addition CT scan also showed hepatic lesions which will need further work-up.  Also showed right renal stone with some gas with UA concerning for UTI was started on ceftriaxone.  ER physician had discussed with on-call gastroenterologist Dr. Collene Mares admitted for further GI work-up.  COVID test negative.  Assessment & Plan:   Principal Problem:   Lower GI bleed Active Problems:   Thoracic myelopathy   Paraplegia (HCC)   Anemia   Essential hypertension   Diabetes mellitus type 2 in nonobese (HCC)   Acute GI bleeding   Malnutrition of moderate degree    Acute lower GI bleed  -Patient presents with a few episodes of bright red blood per rectum, as discussed with GI this is most likely related to diverticular bleed with known history of diverticulosis as colonoscopy in 2016  showed severe diverticulosis.  -Continue to hold aspirin for now. -Continue to monitor CBC, will check 1 level this afternoon and repeat in a.m.[name] -Continue to monitor CBC closely, so far we will hold on colonoscopy unless hemoglobin is trending down as discussed with Dr. Benson Norway from GI.  Renal stone with gas  -Patient doing self cath given neurogenic bladder, so cannot have any dysuria or polyuria or cannot provide any UTI symptoms, but given radiological findings  will continue with IV Rocephin to treat UTI, follow on urine cultures.  .  Abnormal hepatic lesions seen in the CT scan  -Patient reports this has been followed closely by [facility], already had MRI done last year . -I have discussed these findings with the patient and he will continue to follow with his [place] clinic   History of diabetes mellitus type 2 takes Lantus 8 units for now we will keep patient on sliding scale coverage.  Hypertension needs to verify home medication we will keep patient.  IV hydralazine.  History of thoracic myelopathy takes muscle relaxant does have to be verified.  Chronic kidney disease stage III creatinine is improved when compared to previous.  Follow metabolic panel.  Neurogenic bladder self cath.      DVT prophylaxis: SCD Code Status: Full Family Communication: none at bedside Disposition:   Status is: Observation The patient will require care spanning > 2 midnights and should be moved to inpatient because: Patient will need further monitoring of his CBC to ensure stable hemoglobin, as well will need IV Rocephin for UTI   Consultants:  GI Dr. Benson Norway   Subjective:  He denies any abdominal pain, nausea, vomiting, fever or chills  Objective: Vitals:   04/24/21 2339 04/25/21 0347 04/25/21 0748 04/25/21 1158  BP: 115/77 129/72 112/63 106/71  Pulse: (!) 46 (!) 48 60 77  Resp: '20 18 16 17  '$ Temp: 97.7 F (36.5 C) 98.1 F (36.7 C) 98.4 F (36.9 C) 98.1 F (36.7  C)  TempSrc: Oral Oral Oral Oral  SpO2: 97% 98%  100%  Weight:      Height:        Intake/Output Summary (Last 24 hours) at 04/25/2021 1506 Last data filed at 04/25/2021 1159 Gross per 24 hour  Intake 96.76 ml  Output 1625 ml  Net -1528.24 ml   Filed Weights   04/24/21 0909  Weight: 69.4 kg    Examination:  Awake Alert, Oriented X 3, No new F.N deficits, Normal affect Symmetrical Chest wall movement, Good air movement bilaterally, CTAB RRR,No Gallops,Rubs or  new Murmurs, No Parasternal Heave +ve B.Sounds, Abd Soft, No tenderness, No rebound - guarding or rigidity. No Cyanosis, Clubbing or edema, No new Rash or bruise       Data Reviewed: I have personally reviewed following labs and imaging studies  CBC: Recent Labs  Lab 04/24/21 0921 04/24/21 2119 04/25/21 0017 04/25/21 0350 04/25/21 0733  WBC 10.4 9.6 9.2 7.4 7.9  HGB 9.2* 8.1* 7.9* 8.6* 8.5*  HCT 30.1* 25.8* 24.7* 27.5* 27.4*  MCV 78.4* 79.1* 78.4* 78.6* 78.3*  PLT 242 201 194 210 440    Basic Metabolic Panel: Recent Labs  Lab 04/24/21 0921 04/25/21 0350  NA 137 142  K 4.1 4.2  CL 104 110  CO2 26 24  GLUCOSE 141* 104*  BUN 37* 22  CREATININE 1.40* 1.19  CALCIUM 9.6 9.4    GFR: Estimated Creatinine Clearance: 55.1 mL/min (by C-G formula based on SCr of 1.19 mg/dL).  Liver Function Tests: Recent Labs  Lab 04/24/21 0921  AST 12*  ALT 11  ALKPHOS 51  BILITOT 0.3  PROT 7.3  ALBUMIN 3.5    CBG: Recent Labs  Lab 04/25/21 0245 04/25/21 0748 04/25/21 1157  GLUCAP 89 115* 131*     Recent Results (from the past 240 hour(s))  Resp Panel by RT-PCR (Flu A&B, Covid) Nasopharyngeal Swab     Status: None   Collection Time: 04/24/21  2:10 PM   Specimen: Nasopharyngeal Swab; Nasopharyngeal(NP) swabs in vial transport medium  Result Value Ref Range Status   SARS Coronavirus 2 by RT PCR NEGATIVE NEGATIVE Final    Comment: (NOTE) SARS-CoV-2 target nucleic acids are NOT DETECTED.  The SARS-CoV-2 RNA is generally detectable in upper respiratory specimens during the acute phase of infection. The lowest concentration of SARS-CoV-2 viral copies this assay can detect is 138 copies/mL. A negative result does not preclude SARS-Cov-2 infection and should not be used as the sole basis for treatment or other patient management decisions. A negative result may occur with  improper specimen collection/handling, submission of specimen other than nasopharyngeal swab,  presence of viral mutation(s) within the areas targeted by this assay, and inadequate number of viral copies(<138 copies/mL). A negative result must be combined with clinical observations, patient history, and epidemiological information. The expected result is Negative.  Fact Sheet for Patients:  EntrepreneurPulse.com.au  Fact Sheet for Healthcare Providers:  IncredibleEmployment.be  This test is no t yet approved or cleared by the [place] FDA and  has been authorized for detection and/or diagnosis of SARS-CoV-2 by FDA under an Emergency Use Authorization (EUA). This EUA will remain  in effect (meaning this test can be used) for the duration of the COVID-19 declaration under Section 564(b)(1) of the Act, 21 U.S.C.section 360bbb-3(b)(1), unless the authorization is terminated  or revoked sooner.       Influenza A by PCR NEGATIVE NEGATIVE Final   Influenza B by PCR NEGATIVE NEGATIVE Final  Comment: (NOTE) The Xpert Xpress SARS-CoV-2/FLU/RSV plus assay is intended as an aid in the diagnosis of influenza from Nasopharyngeal swab specimens and should not be used as a sole basis for treatment. Nasal washings and aspirates are unacceptable for Xpert Xpress SARS-CoV-2/FLU/RSV testing.  Fact Sheet for Patients: EntrepreneurPulse.com.au  Fact Sheet for Healthcare Providers: IncredibleEmployment.be  This test is not yet approved or cleared by the [place] FDA and has been authorized for detection and/or diagnosis of SARS-CoV-2 by FDA under an Emergency Use Authorization (EUA). This EUA will remain in effect (meaning this test can be used) for the duration of the COVID-19 declaration under Section 564(b)(1) of the Act, 21 U.S.C. section 360bbb-3(b)(1), unless the authorization is terminated or revoked.  Performed at [facility], [address] [place], [place] [address]           Radiology Studies: CT Angio Abd/Pel W and/or Wo Contrast  Result Date: 04/24/2021 CLINICAL DATA:  Lower GI bleed.  30 pound weight loss in 3 months. EXAM: CTA ABDOMEN AND PELVIS WITHOUT AND WITH CONTRAST TECHNIQUE: Multidetector CT imaging of the abdomen and pelvis was performed using the standard protocol during bolus administration of intravenous contrast. Multiplanar reconstructed images and MIPs were obtained and reviewed to evaluate the vascular anatomy. RADIATION DOSE REDUCTION: This exam was performed according to the departmental dose-optimization program which includes automated exposure control, adjustment of the mA and/or kV according to patient size and/or use of iterative reconstruction technique. CONTRAST:  19m OMNIPAQUE IOHEXOL 350 MG/ML SOLN COMPARISON:  None. FINDINGS: VASCULAR Aorta: Atherosclerotic calcifications in the abdominal aorta without aneurysm, dissection or significant stenosis. Celiac: Variant anatomy with celiac trunk supplying the left gastric artery and the splenic artery. Celiac trunk is widely patent without aneurysm, dissection significant stenosis. SMA: SMA is widely patent. The common hepatic artery originates from the proximal SMA and this is a variant. Main SMA branches are patent. Renals: Both renal arteries are patent without evidence of aneurysm, dissection, vasculitis, fibromuscular dysplasia or significant stenosis. IMA: Patent without evidence of aneurysm, dissection, vasculitis or significant stenosis. Inflow: Patent without evidence of aneurysm, dissection, vasculitis or significant stenosis. Proximal Outflow: Proximal femoral arteries are patent bilaterally. Veins: Limited evaluation of the venous structures due to the timing of the study. Main portal venous system and renal veins are patent. Review of the MIP images confirms the above findings. NON-VASCULAR Lower chest: Lung bases are clear. Hepatobiliary: Multiple hypodensities in the liver. Largest  hypodensity is compatible with a cyst measuring up to 2.4 cm in the posterior right hepatic lobe. However, many of these hypodensities are small and too dense to be characterized as simple cysts. There is a questionable lesion in the subcapsular region of the right hepatic lobe on sequence 11 image 20 that measures roughly 1.4 cm. Normal appearance of the gallbladder. No biliary dilatation. Pancreas: Unremarkable. No pancreatic ductal dilatation or surrounding inflammatory changes. Spleen: Normal in size without focal abnormality. Adrenals/Urinary Tract: Adrenal thickening bilaterally. Findings could represent adrenal hyperplasia. 8 mm calculus in the right kidney lower pole with small amount of adjacent gas. Gas is isolated to this stone region and suspect that the stone is obstructing a small calyx which contains gas. No other signs for infection or inflammatory changes in the right kidney. No other urinary calculi. Low-density structure in the right kidney interpolar region is suggestive for cyst. Cysts in left kidney lower pole. Negative for hydronephrosis. Normal appearance of the urinary bladder. Stomach/Bowel: Normal appearance of the stomach and duodenum.  Normal caliber of the small bowel without obstruction. Multiple diverticula involving the sigmoid colon without evidence for active bleeding or acute inflammation. Normal appendix in the right lower quadrant. Right colon and cecum are slightly redundant which makes interpretation of this area difficult but no evidence for active GI bleeding in this area. No clear evidence for a bowel lesion. Lymphatic: Mildly prominent lymph nodes in the posterior lower mediastinum adjacent to the descending thoracic aorta measuring 8 mm in the short axis on sequence 11 image 8. Otherwise, there is no significant lymph node enlargement in the abdomen or pelvis. Reproductive: Prostate is unremarkable. Other: Negative for ascites.  Negative for free air. Musculoskeletal: No  acute bone abnormality. IMPRESSION: VASCULAR 1. 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 2. Variant visceral artery anatomy with the common hepatic artery originating from the SMA. 3. No evidence for active GI bleeding. NON-VASCULAR 1. Multiple small hypodensities throughout the liver. These could represent multiple small cysts but too small to definitively characterize. There is a questionable lesion in the right subcapsular region measuring up to 1.4 cm. Based on the history of recent weight loss, recommend further characterization with an abdominal MRI, with and without contrast, to a exclude neoplastic process. 2. 8 mm calculus in the right kidney lower pole with small amount of adjacent gas. The gas raises concern for localized infection in this area. Overall, no significant inflammatory changes involving the right kidney. Negative for hydronephrosis. 3. Extensive colonic diverticulosis but no evidence for acute colonic inflammation. These results were called by telephone at the time of interpretation on 04/24/2021 at 1:15 pm to provider [facility] , who verbally acknowledged these results. Electronically Signed   By: Markus Daft M.D.   On: 04/24/2021 13:19        Scheduled Meds:  feeding supplement  1 Container Oral TID BM   insulin aspart  0-6 Units Subcutaneous TID WC   multivitamin with minerals  1 tablet Oral Daily   Continuous Infusions:  cefTRIAXone (ROCEPHIN)  IV 1 g (04/25/21 1421)   lactated ringers 100 mL/hr at 04/25/21 1115     LOS: 0 days      Phillips Climes, MD [facility]   To contact the attending provider between 7A-7P or the covering provider during after hours 7P-7A, please log into the web site [email or web address] and access using universal [place] password for that web site. If you do not have the password, please call the hospital operator.  04/25/2021, 3:06 PM

## 2021-04-25 NOTE — Consult Note (Signed)
UNASSIGNED CONSULT  Reason for Consult:Diverticular bleed Referring Physician: [facility]  Arnetha Gula HPI: This is a 71 year old male with a PMH of diverticula (09/29/2014 colonoscopy at the [facility]), DM, and HTN admitted for hematochezia.  Three days prior to admission the patient started to experience painless hematochezia.  In his estimation he had a total of 6 bloody bowel movements and after three days he felt the need for evaluation.  In the ER his HGB was 8.6 g/dL and his baseline is around 11-12 g/dL.  He denies any chest pain, SOB, or DOE.  Since being admitted he does not report any further bleeding.  A CTA was performed and there was no evidence of active bleeding.  Multiple lesions were noted in the liver, but the lesions were too small to characterize.  It was reported that he already had an MRI at the [facility] and he is to have routine follow up.  Past Medical History:  Diagnosis Date   Colon polyp    Diabetes mellitus without complication (HCC)    Diabetic neuropathy (HCC)    HTN (hypertension)    Patella fracture    Renal calculi     Past Surgical History:  Procedure Laterality Date   LUMBAR LAMINECTOMY/DECOMPRESSION MICRODISCECTOMY N/A 07/01/2019   Procedure: LUMBAR LAMINECTOMY/DECOMPRESSION MICRODISCECTOMY 1 LEVEL, THORACIC SIX-SEVEN;  Surgeon: Lisbeth Renshaw, MD;  Location: [facility] OR;  Service: Neurosurgery;  Laterality: N/A;  LUMBAR LAMINECTOMY/DECOMPRESSION MICRODISCECTOMY 1 LEVEL, THORACIC SIX-SEVEN    ORIF PATELLA     THORACIC DISCECTOMY N/A 07/03/2019   Procedure: Evacuation of Thoracic Hematoma;  Surgeon: Lisbeth Renshaw, MD;  Location: [facility] OR;  Service: Neurosurgery;  Laterality: N/A;    Family History  Problem Relation Age of Onset   Hypertension Mother    Hypertension Father     Social History:  reports that he has never smoked. He has never used smokeless tobacco. He reports current alcohol use. He reports current drug use. Drug:  Marijuana.  Allergies:  Allergies  Allergen Reactions   Lisinopril Other (See Comments)    Angioedema   Enalapril-Hctz [Enalapril-Hydrochlorothiazide] Rash    Medications: Scheduled:  feeding supplement  1 Container Oral TID BM   insulin aspart  0-6 Units Subcutaneous TID WC   multivitamin with minerals  1 tablet Oral Daily   Continuous:  cefTRIAXone (ROCEPHIN)  IV 1 g (04/25/21 1421)   lactated ringers 100 mL/hr at 04/25/21 1115    Results for orders placed or performed during the hospital encounter of 04/24/21 (from the past 24 hour(s))  CBC     Status: Abnormal   Collection Time: 04/24/21  9:19 PM  Result Value Ref Range   WBC 9.6 4.0 - 10.5 K/uL   RBC 3.26 (L) 4.22 - 5.81 MIL/uL   Hemoglobin 8.1 (L) 13.0 - 17.0 g/dL   HCT 65.7 (L) 84.6 - 96.2 %   MCV 79.1 (L) 80.0 - 100.0 fL   MCH 24.8 (L) 26.0 - 34.0 pg   MCHC 31.4 30.0 - 36.0 g/dL   RDW 95.2 (H) 84.1 - 32.4 %   Platelets 201 150 - 400 K/uL   nRBC 0.0 0.0 - 0.2 %  Type and screen [facility]     Status: None   Collection Time: 04/24/21  9:25 PM  Result Value Ref Range   ABO/RH(D) A POS    Antibody Screen NEG    Sample Expiration      04/27/2021,2359 Performed at [facility], [address] [place], [place] [address]   CBC     Status: Abnormal   Collection Time: 04/25/21 12:17 AM  Result Value Ref Range   WBC 9.2 4.0 - 10.5 K/uL   RBC 3.15 (L) 4.22 - 5.81 MIL/uL   Hemoglobin 7.9 (L) 13.0 - 17.0 g/dL   HCT 13.2 (L) 44.0 - 10.2 %   MCV 78.4 (L) 80.0 - 100.0 fL   MCH 25.1 (L) 26.0 - 34.0 pg   MCHC 32.0 30.0 - 36.0 g/dL   RDW 72.5 (H) 36.6 - 44.0 %   Platelets 194 150 - 400 K/uL   nRBC 0.0 0.0 - 0.2 %  Glucose, capillary     Status: None   Collection Time: 04/25/21  2:45 AM  Result Value Ref Range   Glucose-Capillary 89 70 - 99 mg/dL  CBC     Status: Abnormal   Collection Time: 04/25/21  3:50 AM  Result Value Ref Range   WBC 7.4 4.0 - 10.5 K/uL   RBC 3.50 (L) 4.22 - 5.81 MIL/uL    Hemoglobin 8.6 (L) 13.0 - 17.0 g/dL   HCT 34.7 (L) 42.5 - 95.6 %   MCV 78.6 (L) 80.0 - 100.0 fL   MCH 24.6 (L) 26.0 - 34.0 pg   MCHC 31.3 30.0 - 36.0 g/dL   RDW 38.7 (H) 56.4 - 33.2 %   Platelets 210 150 - 400 K/uL   nRBC 0.0 0.0 - 0.2 %  Basic metabolic panel     Status: Abnormal   Collection Time: 04/25/21  3:50 AM  Result Value Ref Range   Sodium 142 135 - 145 mmol/L   Potassium 4.2 3.5 - 5.1 mmol/L   Chloride 110 98 - 111 mmol/L   CO2 24 22 - 32 mmol/L   Glucose, Bld 104 (H) 70 - 99 mg/dL   BUN 22 8 - 23 mg/dL   Creatinine, Ser 9.51 0.61 - 1.24 mg/dL   Calcium 9.4 8.9 - 88.4 mg/dL   GFR, Estimated >16 >60 mL/min   Anion gap 8 5 - 15  CBC     Status: Abnormal   Collection Time: 04/25/21  7:33 AM  Result Value Ref Range   WBC 7.9 4.0 - 10.5 K/uL   RBC 3.50 (L) 4.22 - 5.81 MIL/uL   Hemoglobin 8.5 (L) 13.0 - 17.0 g/dL   HCT 63.0 (L) 16.0 - 10.9 %   MCV 78.3 (L) 80.0 - 100.0 fL   MCH 24.3 (L) 26.0 - 34.0 pg   MCHC 31.0 30.0 - 36.0 g/dL   RDW 32.3 (H) 55.7 - 32.2 %   Platelets 214 150 - 400 K/uL   nRBC 0.0 0.0 - 0.2 %  Glucose, capillary     Status: Abnormal   Collection Time: 04/25/21  7:48 AM  Result Value Ref Range   Glucose-Capillary 115 (H) 70 - 99 mg/dL  Glucose, capillary     Status: Abnormal   Collection Time: 04/25/21 11:57 AM  Result Value Ref Range   Glucose-Capillary 131 (H) 70 - 99 mg/dL     CT Angio Abd/Pel W and/or Wo Contrast  Result Date: 04/24/2021 CLINICAL DATA:  Lower GI bleed.  30 pound weight loss in 3 months. EXAM: CTA ABDOMEN AND PELVIS WITHOUT AND WITH CONTRAST TECHNIQUE: Multidetector CT imaging of the abdomen and pelvis was performed using the standard protocol during bolus administration of intravenous contrast. Multiplanar reconstructed images and MIPs were obtained and reviewed to evaluate the vascular anatomy. RADIATION DOSE REDUCTION: This  exam was performed according to the departmental dose-optimization program which includes automated  exposure control, adjustment of the mA and/or kV according to patient size and/or use of iterative reconstruction technique. CONTRAST:  75mL OMNIPAQUE IOHEXOL 350 MG/ML SOLN COMPARISON:  None. FINDINGS: VASCULAR Aorta: Atherosclerotic calcifications in the abdominal aorta without aneurysm, dissection or significant stenosis. Celiac: Variant anatomy with celiac trunk supplying the left gastric artery and the splenic artery. Celiac trunk is widely patent without aneurysm, dissection significant stenosis. SMA: SMA is widely patent. The common hepatic artery originates from the proximal SMA and this is a variant. Main SMA branches are patent. Renals: Both renal arteries are patent without evidence of aneurysm, dissection, vasculitis, fibromuscular dysplasia or significant stenosis. IMA: Patent without evidence of aneurysm, dissection, vasculitis or significant stenosis. Inflow: Patent without evidence of aneurysm, dissection, vasculitis or significant stenosis. Proximal Outflow: Proximal femoral arteries are patent bilaterally. Veins: Limited evaluation of the venous structures due to the timing of the study. Main portal venous system and renal veins are patent. Review of the MIP images confirms the above findings. NON-VASCULAR Lower chest: Lung bases are clear. Hepatobiliary: Multiple hypodensities in the liver. Largest hypodensity is compatible with a cyst measuring up to 2.4 cm in the posterior right hepatic lobe. However, many of these hypodensities are small and too dense to be characterized as simple cysts. There is a questionable lesion in the subcapsular region of the right hepatic lobe on sequence 11 image 20 that measures roughly 1.4 cm. Normal appearance of the gallbladder. No biliary dilatation. Pancreas: Unremarkable. No pancreatic ductal dilatation or surrounding inflammatory changes. Spleen: Normal in size without focal abnormality. Adrenals/Urinary Tract: Adrenal thickening bilaterally. Findings could  represent adrenal hyperplasia. 8 mm calculus in the right kidney lower pole with small amount of adjacent gas. Gas is isolated to this stone region and suspect that the stone is obstructing a small calyx which contains gas. No other signs for infection or inflammatory changes in the right kidney. No other urinary calculi. Low-density structure in the right kidney interpolar region is suggestive for cyst. Cysts in left kidney lower pole. Negative for hydronephrosis. Normal appearance of the urinary bladder. Stomach/Bowel: Normal appearance of the stomach and duodenum. Normal caliber of the small bowel without obstruction. Multiple diverticula involving the sigmoid colon without evidence for active bleeding or acute inflammation. Normal appendix in the right lower quadrant. Right colon and cecum are slightly redundant which makes interpretation of this area difficult but no evidence for active GI bleeding in this area. No clear evidence for a bowel lesion. Lymphatic: Mildly prominent lymph nodes in the posterior lower mediastinum adjacent to the descending thoracic aorta measuring 8 mm in the short axis on sequence 11 image 8. Otherwise, there is no significant lymph node enlargement in the abdomen or pelvis. Reproductive: Prostate is unremarkable. Other: Negative for ascites.  Negative for free air. Musculoskeletal: No acute bone abnormality. IMPRESSION: VASCULAR 1. 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 2. Variant visceral artery anatomy with the common hepatic artery originating from the SMA. 3. No evidence for active GI bleeding. NON-VASCULAR 1. Multiple small hypodensities throughout the liver. These could represent multiple small cysts but too small to definitively characterize. There is a questionable lesion in the right subcapsular region measuring up to 1.4 cm. Based on the history of recent weight loss, recommend further characterization with an abdominal MRI, with  and without contrast, to a exclude neoplastic process. 2. 8 mm calculus in the right kidney lower  pole with small amount of adjacent gas. The gas raises concern for localized infection in this area. Overall, no significant inflammatory changes involving the right kidney. Negative for hydronephrosis. 3. Extensive colonic diverticulosis but no evidence for acute colonic inflammation. These results were called by telephone at the time of interpretation on 04/24/2021 at 1:15 pm to provider [facility] , who verbally acknowledged these results. Electronically Signed   By: Richarda Overlie M.D.   On: 04/24/2021 13:19    ROS:  As stated above in the HPI otherwise negative.  Blood pressure 106/71, pulse 77, temperature 98.1 F (36.7 C), temperature source Oral, resp. rate 17, height 5\' 11"  (1.803 m), weight 69.4 kg, SpO2 100 %.    PE: Gen: NAD, Alert and Oriented HEENT:  [place]/AT, EOMI Neck: Supple, no LAD Lungs: CTA Bilaterally CV: RRR without M/G/R ABD: Soft, NTND, +BS Ext: No C/C/E  Assessment/Plan: 1) Diverticular bleed. 2) Anemia. 3) Hepatic lesions.   The patient's presentation is consistent with a diverticular bleed.  He states that he already has a follow up appointment at the [facility] next week.  He can have a follow up colonoscopy at the [place], which is where he obtains all of his care.  Also, it is reported that he is already being worked up for the hepatic lesions at the [place].  Plan: 1) Follow HGB. 2) Transfuse if necessary. 3) If no further bleeding by tomorrow AM he can be discharged home. 4) Follow up with his PCP at the [facility] next week, as scheduled.  Vali Capano D 04/25/2021, 3:23 PM

## 2021-04-25 NOTE — Plan of Care (Signed)
?  Problem: Education: ?Goal: Knowledge of General Education information will improve ?Description: Including pain rating scale, medication(s)/side effects and non-pharmacologic comfort measures ?Outcome: Progressing ?  ?Problem: Health Behavior/Discharge Planning: ?Goal: Ability to manage health-related needs will improve ?Outcome: Progressing ?  ?Problem: Clinical Measurements: ?Goal: Will remain free from infection ?Outcome: Progressing ?Goal: Diagnostic test results will improve ?Outcome: Progressing ?Goal: Respiratory complications will improve ?Outcome: Progressing ?Goal: Cardiovascular complication will be avoided ?Outcome: Progressing ?  ?Problem: Activity: ?Goal: Risk for activity intolerance will decrease ?Outcome: Progressing ?  ?Problem: Coping: ?Goal: Level of anxiety will decrease ?Outcome: Progressing ?  ?Problem: Elimination: ?Goal: Will not experience complications related to urinary retention ?Outcome: Progressing ?  ?Problem: Pain Managment: ?Goal: General experience of comfort will improve ?Outcome: Progressing ?  ?Problem: Safety: ?Goal: Ability to remain free from injury will improve ?Outcome: Progressing ?  ?Problem: Skin Integrity: ?Goal: Risk for impaired skin integrity will decrease ?Outcome: Progressing ?  ?Problem: Fluid Volume: ?Goal: Will show no signs and symptoms of excessive bleeding ?Outcome: Progressing ?  ?Problem: Clinical Measurements: ?Goal: Ability to maintain clinical measurements within normal limits will improve ?Outcome: Not Progressing ?  ?Problem: Nutrition: ?Goal: Adequate nutrition will be maintained ?Outcome: Not Progressing ?  ?Problem: Elimination: ?Goal: Will not experience complications related to bowel motility ?Outcome: Not Progressing ?  ?Problem: Education: ?Goal: Ability to identify signs and symptoms of gastrointestinal bleeding will improve ?Outcome: Not Progressing ?  ?Problem: Bowel/Gastric: ?Goal: Will show no signs and symptoms of gastrointestinal  bleeding ?Outcome: Not Progressing ?  ?

## 2021-04-25 NOTE — Progress Notes (Signed)
Initial Nutrition Assessment ? ?DOCUMENTATION CODES:  ? ?Non-severe (moderate) malnutrition in context of chronic illness ? ?INTERVENTION:  ? ?Multivitamin w/ minerals daily ?Boost Breeze po TID, each supplement provides 250 kcal and 9 grams of protein ?Advance diet as medically appropriate per MD  ?Encourage good PO intake ? ?NUTRITION DIAGNOSIS:  ? ?Moderate Malnutrition related to chronic illness as evidenced by moderate muscle depletion, moderate fat depletion, energy intake < or equal to 75% for > or equal to 1 month. ? ?GOAL:  ? ?Patient will meet greater than or equal to 90% of their needs ? ?MONITOR:  ? ?PO intake, Supplement acceptance, Diet advancement, Weight trends ? ?REASON FOR ASSESSMENT:  ? ?Malnutrition Screening Tool ?  ? ?ASSESSMENT:  ? ?71 y.o. presented to the ED with maroon colored stools x 3 days. PMH includes T2DM, CKD III, HTN, and paraplegic. Pt admitted with lower GI bleed and renal stones.  ? ?Pt reports initially reported that his appetite was good at home and that he was eating well. Then pt stated that his appetite has been poor for ~3 months and that he has not been eating much. Pt reports that he would take a few bites of his meals and then would stop eating. Pt reports that he would not force himself to eat in fear that he would throw-up. Pt denies any nausea or vomiting. Pt reports that he has been tolerating the liquid trays and that he has been completing them for the most part  ? ?Pt reports that his UBW is 185# and that in the past 3 months he has lost 30#. Pt believes that weight loss is related to poor appetite and not eating. Suspect that current weight is stated. ? ?Discussed ONS with pt, pt reports he was drinking Ensure occassionally at home but not regularly. Pt agreeable to Boost Breeze while on clear liquid diet to provide additional calories and protein. Discussed as diet is advanced will order Ensure. Discussed importance in increase PO intake to prevent further  weight loss.  ? ?Pt with no other question or concerns at this time.  ? ?Medications reviewed and include: SSI 0-6 units TID, IV antibiotics  ?Labs reviewed: 24 hr CBG 89-131 ? ?NUTRITION - FOCUSED PHYSICAL EXAM: ? ?Flowsheet Row Most Recent Value  ?Orbital Region Moderate depletion  ?Upper Arm Region Mild depletion  ?Thoracic and Lumbar Region Mild depletion  ?Buccal Region Moderate depletion  ?Temple Region Severe depletion  ?Clavicle Bone Region Moderate depletion  ?Clavicle and Acromion Bone Region Moderate depletion  ?Scapular Bone Region Moderate depletion  ?Dorsal Hand Moderate depletion  ?Patellar Region Unable to assess  ?Anterior Thigh Region Unable to assess  ?Posterior Calf Region Unable to assess  ?Edema (RD Assessment) None  ?Hair Reviewed  ?Eyes Reviewed  ?Mouth Reviewed  ?Skin Reviewed  ?Nails Reviewed  ? ?  ? ? ?Diet Order:   ?Diet Order   ? ?       ?  Diet clear liquid Room service appropriate? Yes; Fluid consistency: Thin  Diet effective now       ?  ? ?  ?  ? ?  ? ? ?EDUCATION NEEDS:  ? ?No education needs have been identified at this time ? ?Skin:  Skin Assessment: Reviewed RN Assessment ? ?Last BM:  3/6 ? ?Height:  ? ?Ht Readings from Last 1 Encounters:  ?04/24/21 '5\' 11"'$  (1.803 m)  ? ? ?Weight:  ? ?Wt Readings from Last 1 Encounters:  ?04/24/21 69.4 kg  ? ? ?  Ideal Body Weight:  78.2 kg ? ?BMI:  Body mass index is 21.34 kg/m?. ? ?Estimated Nutritional Needs:  ? ?Kcal:  2100-2300 ? ?Protein:  105-120 grams ? ?Fluid:  >/= 2.1 L ? ? ? ?Hermina Barters RD, LDN ?Clinical Dietitian ?See AMi[facility] for contact information.  ? ?

## 2021-04-25 NOTE — Evaluation (Signed)
Physical Therapy Evaluation ?Patient Details ?Name: Timothy Davenport ?MRN: [id number] ?DOB: Jun 03, 1950 ?Today's Date: 04/25/2021 ? ?History of Present Illness ? Pt is a 71 y.o. M who presents with hematochezia.  In the ER his HGB was 8.6 g/dL and his baseline is around 11-12 g/dL. CTA performed and no evidence of active bleeding. Multiple lesions noted in liver. Pt reported he already had MRI at [facility] and he is to have routine follow up. Significant PMH: DM, diabetic neuropathy, HTN, neurogenic bowel/bladder, throacic myelopathy.  ?Clinical Impression ? PTA, pt lives with a spouse with a ramped entry house, is modI with wheelchair mobility and ADL's, and reports ambulating ~300 ft with a walker in PT. Pt did not have R AFO present, likely affecting his mobility. Pt ambulating 60 feet with a walker at a min guard assist level; HR up to 140's. Pt with BLE weakness, impaired balance and gait abnormalities which are chronic in nature. Will benefit from continued OPPT to address deficits and maximize functional independence. ?   ? ?Recommendations for follow up therapy are one component of a multi-disciplinary discharge planning process, led by the attending physician.  Recommendations may be updated based on patient status, additional functional criteria and insurance authorization. ? ?Follow Up Recommendations Outpatient PT (continued neuro OPPT and aquatic therapy) ? ?  ?Assistance Recommended at Discharge PRN  ?Patient can return home with the following ? A little help with walking and/or transfers;A little help with bathing/dressing/bathroom;Assist for transportation;Help with stairs or ramp for entrance ? ?  ?Equipment Recommendations None recommended by PT  ?Recommendations for Other Services ?    ?  ?Functional Status Assessment Patient has had a recent decline in their functional status and demonstrates the ability to make significant improvements in function in a reasonable and predictable amount of time.  ? ?   ?Precautions / Restrictions Precautions ?Precautions: Fall ?Restrictions ?Weight Bearing Restrictions: No  ? ?  ? ?Mobility ? Bed Mobility ?Overal bed mobility: Modified Independent ?  ?  ?  ?  ?  ?  ?General bed mobility comments: Pt using arms to manage BLE's on and off bed ?  ? ?Transfers ?Overall transfer level: Needs assistance ?Equipment used: Rolling walker (2 wheels) ?Transfers: Sit to/from Stand ?Sit to Stand: Min guard ?  ?  ?  ?  ?  ?General transfer comment: able to rise from low bed surface ?  ? ?Ambulation/Gait ?Ambulation/Gait assistance: Min guard ?Gait Distance (Feet): 60 Feet ?Assistive device: Rolling walker (2 wheels) ?Gait Pattern/deviations: Step-through pattern, Decreased dorsiflexion - left, Decreased stride length, Decreased dorsiflexion - right, Trunk flexed ?Gait velocity: decreased ?Gait velocity interpretation: <1.8 ft/sec, indicate of risk for recurrent falls ?  ?General Gait Details: Increased trunk flexion, decreased bilateral heel strike at initial contact, mild circumduction, min guard for safety. ? ?Stairs ?  ?  ?  ?  ?  ? ?Wheelchair Mobility ?  ? ?Modified Rankin (Stroke Patients Only) ?  ? ?  ? ?Balance Overall balance assessment: Needs assistance ?Sitting-balance support: Feet supported ?Sitting balance-Leahy Scale: Good ?  ?  ?Standing balance support: Bilateral upper extremity supported ?Standing balance-Leahy Scale: Poor ?Standing balance comment: reliant on RW ?  ?  ?  ?  ?  ?  ?  ?  ?  ?  ?  ?   ? ? ? ?Pertinent Vitals/Pain Pain Assessment ?Pain Assessment: No/denies pain  ? ? ?Home Living Family/patient expects to be discharged to:: Private residence ?Living Arrangements: Spouse/significant other ?Available Help  at Discharge: Family ?Type of Home: House ?Home Access: Stairs to enter;Ramped entrance ?  ?Entrance Stairs-Number of Steps: 1 ?  ?Home Layout: One level ?Home Equipment: Wheelchair - Publishing copy (2 wheels);Other (comment);Toilet riser;Cane - single  point ("shower wheelchair") ?   ?  ?Prior Function Prior Level of Function : Needs assist ?  ?  ?  ?  ?  ?  ?Mobility Comments: walking 300 ft with walker ?ADLs Comments: typically independently ?  ? ? ?Hand Dominance  ?   ? ?  ?Extremity/Trunk Assessment  ? Upper Extremity Assessment ?Upper Extremity Assessment: Defer to OT evaluation ?  ? ?Lower Extremity Assessment ?Lower Extremity Assessment: RLE deficits/detail;LLE deficits/detail ?RLE Deficits / Details: Hip flexion 2/5, knee extension 5/5, ankle dorsiflexion 2/5 ?LLE Deficits / Details: Hip flexion 2-/5, knee extension 5/5, ankle dorsiflexion 2/5 ?  ? ?   ?Communication  ? Communication: No difficulties  ?Cognition Arousal/Alertness: Awake/alert ?Behavior During Therapy: [facility] for tasks assessed/performed ?Overall Cognitive Status: Within Functional Limits for tasks assessed ?  ?  ?  ?  ?  ?  ?  ?  ?  ?  ?  ?  ?  ?  ?  ?  ?  ?  ?  ? ?  ?General Comments   ? ?  ?Exercises    ? ?Assessment/Plan  ?  ?PT Assessment Patient needs continued PT services  ?PT Problem List Decreased strength;Decreased activity tolerance;Decreased mobility;Decreased balance ? ?   ?  ?PT Treatment Interventions DME instruction;Gait training;Therapeutic activities;Functional mobility training;Therapeutic exercise;Balance training;Patient/family education;Wheelchair mobility training   ? ?PT Goals (Current goals can be found in the Care Plan section)  ?Acute Rehab PT Goals ?Patient Stated Goal: continued therapy ?PT Goal Formulation: With patient ?Time For Goal Achievement: 05/09/21 ?Potential to Achieve Goals: Good ?Additional Goals ?Additional Goal #1: Pt will propel w/c with BUE's x 250 ft modI ? ?  ?Frequency Min 3X/week ?  ? ? ?Co-evaluation   ?  ?  ?  ?  ? ? ?  ?AM-PAC PT "6 Clicks" Mobility  ?Outcome Measure Help needed turning from your back to your side while in a flat bed without using bedrails?: None ?Help needed moving from lying on your back to sitting on the side of a flat bed  without using bedrails?: None ?Help needed moving to and from a bed to a chair (including a wheelchair)?: A Little ?Help needed standing up from a chair using your arms (e.g., wheelchair or bedside chair)?: A Little ?Help needed to walk in hospital room?: A Little ?Help needed climbing 3-5 steps with a railing? : A Lot ?6 Click Score: 19 ? ?  ?End of Session Equipment Utilized During Treatment: Gait belt ?Activity Tolerance: Patient tolerated treatment well ?Patient left: in bed;with call bell/phone within reach ?Nurse Communication: Mobility status ?PT Visit Diagnosis: Unsteadiness on feet (R26.81);Other abnormalities of gait and mobility (R26.89);Difficulty in walking, not elsewhere classified (R26.2) ?  ? ?Time: 6283-6629 ?PT Time Calculation (min) (ACUTE ONLY): 25 min ? ? ?Charges:   PT Evaluation ?$PT Eval Moderate Complexity: 1 Mod ?PT Treatments ?$Therapeutic Activity: 8-22 mins ?  ?   ? ? ?Wyona Almas, PT, DPT ?Acute Rehabilitation Services ?Pager [phone] ?Office [phone] ? ? ?Deno Etienne ?04/25/2021, 5:10 PM ? ?

## 2021-04-26 ENCOUNTER — Ambulatory Visit: Payer: No Typology Code available for payment source | Admitting: Physical Therapy

## 2021-04-26 ENCOUNTER — Other Ambulatory Visit (HOSPITAL_COMMUNITY): Payer: Self-pay

## 2021-04-26 DIAGNOSIS — K922 Gastrointestinal hemorrhage, unspecified: Secondary | ICD-10-CM | POA: Diagnosis not present

## 2021-04-26 DIAGNOSIS — E119 Type 2 diabetes mellitus without complications: Secondary | ICD-10-CM

## 2021-04-26 DIAGNOSIS — I10 Essential (primary) hypertension: Secondary | ICD-10-CM | POA: Diagnosis not present

## 2021-04-26 DIAGNOSIS — K769 Liver disease, unspecified: Secondary | ICD-10-CM | POA: Diagnosis not present

## 2021-04-26 LAB — BASIC METABOLIC PANEL
Anion gap: 8 (ref 5–15)
BUN: 13 mg/dL (ref 8–23)
CO2: 24 mmol/L (ref 22–32)
Calcium: 9.5 mg/dL (ref 8.9–10.3)
Chloride: 107 mmol/L (ref 98–111)
Creatinine, Ser: 1.18 mg/dL (ref 0.61–1.24)
GFR, Estimated: >60 mL/min (ref >60)
Glucose, Bld: 120 mg/dL — ABNORMAL HIGH (ref 70–99)
Potassium: 3.5 mmol/L (ref 3.5–5.1)
Sodium: 139 mmol/L (ref 135–145)

## 2021-04-26 LAB — CBC
HCT: 27.8 % — ABNORMAL LOW (ref 39.0–52.0)
Hemoglobin: 8.8 g/dL — ABNORMAL LOW (ref 13.0–17.0)
MCH: 24.4 pg — ABNORMAL LOW (ref 26.0–34.0)
MCHC: 31.7 g/dL (ref 30.0–36.0)
MCV: 77 fL — ABNORMAL LOW (ref 80.0–100.0)
Platelets: 223 10*3/uL (ref 150–400)
RBC: 3.61 MIL/uL — ABNORMAL LOW (ref 4.22–5.81)
RDW: 17.9 % — ABNORMAL HIGH (ref 11.5–15.5)
WBC: 7.7 10*3/uL (ref 4.0–10.5)
nRBC: 0 % (ref 0.0–0.2)

## 2021-04-26 LAB — GLUCOSE, CAPILLARY
Glucose-Capillary: 127 mg/dL — ABNORMAL HIGH (ref 70–99)
Glucose-Capillary: 269 mg/dL — ABNORMAL HIGH (ref 70–99)

## 2021-04-26 MED ORDER — CEPHALEXIN 500 MG PO CAPS
500.0000 mg | ORAL_CAPSULE | Freq: Two times a day (BID) | ORAL | 0 refills | Status: AC
  Filled 2021-04-26: qty 8, 4d supply, fill #0

## 2021-04-26 MED ORDER — ASPIRIN EC 81 MG PO TBEC: 81.0000 mg | DELAYED_RELEASE_TABLET | Freq: Every day | ORAL | 11 refills | Status: AC

## 2021-04-26 NOTE — TOC Transition Note (Signed)
Transition of Care (TOC) - CM/SW Discharge Note ? ? ?Patient Details  ?Name: Timothy Davenport ?MRN: [id number] ?Date of Birth: 21-Mar-1950 ? ?Transition of Care (TOC) CM/SW Contact:  ?Cyndi Bender, RN ?Phone Number: ?04/26/2021, 1:38 PM ? ? ?Clinical Narrative:    ?Patient stable for discharge. ?Patient will resume out-patient PT. ?Patient's son will transport patient home. ?No other TOC needs. ? ? ? ?Final next level of care: OP Rehab ?Barriers to Discharge: Barriers Resolved ? ? ?Patient Goals and CMS Choice ?Patient states their goals for this hospitalization and ongoing recovery are:: go home ?  ?  ? ?Discharge Placement ?  ?           ? home ?  ?  ?  ? ?Discharge Plan and Services ?  ?  ?           ? Home w/ outpt PT ?  ?  ?  ?  ?  ?  ?  ?  ?  ? ?Social Determinants of Health (SDOH) Interventions ?  ? ? ?Readmission Risk Interventions ?Readmission Risk Prevention Plan 04/26/2021  ?Post Dischage Appt Complete  ?Medication Screening Complete  ?Transportation Screening Complete  ?Some recent data might be hidden  ? ? ? ? ? ?

## 2021-04-26 NOTE — Discharge Instructions (Signed)
Follow with Primary MD Darnelle Catalan, PA in 7 days  ? ?Get CBC, CMP,  checked  by Primary MD next visit.  ? ? ?Activity: As tolerated with Full fall precautions use walker/cane & assistance as needed ? ? ?Disposition Home  ? ? ?Diet: Heart Healthy ? ? ?On your next visit with your primary care physician please Get Medicines reviewed and adjusted. ? ? ?Please request your Prim.MD to go over all Hospital Tests and Procedure/Radiological results at the follow up, please get all Hospital records sent to your Prim MD by signing hospital release before you go home. ? ? ?If you experience worsening of your admission symptoms, develop shortness of breath, life threatening emergency, suicidal or homicidal thoughts you must seek medical attention immediately by calling 911 or calling your MD immediately  if symptoms less severe. ? ?You Must read complete instructions/literature along with all the possible adverse reactions/side effects for all the Medicines you take and that have been prescribed to you. Take any new Medicines after you have completely understood and accpet all the possible adverse reactions/side effects.  ? ?Do not drive, operating heavy machinery, perform activities at heights, swimming or participation in water activities or provide baby sitting services if your were admitted for syncope or siezures until you have seen by Primary MD or a Neurologist and advised to do so again. ? ?Do not drive when taking Pain medications.  ? ? ?Do not take more than prescribed Pain, Sleep and Anxiety Medications ? ?Special Instructions: If you have smoked or chewed Tobacco  in the last 2 yrs please stop smoking, stop any regular Alcohol  and or any Recreational drug use. ? ?Wear Seat belts while driving. ? ? ?Please note ? ?You were cared for by a hospitalist during your hospital stay. If you have any questions about your discharge medications or the care you received while you were in the hospital after you are  discharged, you can call the unit and asked to speak with the hospitalist on call if the hospitalist that took care of you is not available. Once you are discharged, your primary care physician will handle any further medical issues. Please note that NO REFILLS for any discharge medications will be authorized once you are discharged, as it is imperative that you return to your primary care physician (or establish a relationship with a primary care physician if you do not have one) for your aftercare needs so that they can reassess your need for medications and monitor your lab values.  ?

## 2021-04-26 NOTE — Discharge Summary (Signed)
Physician Discharge Summary  CAPRICE WASKO UUV:[id number] DOB: 03/30/1950 DOA: 04/24/2021  PCP: Darnelle Catalan, PA  Admit date: 04/24/2021 Discharge date: 04/26/2021  Admitted From: Home Disposition:  home with outpatient PT  Recommendations for Outpatient Follow-up:  Follow up with PCP in 1 weeks Please follow on the final results of urine culture   Discharge Condition:Stable CODE STATUS:FULL Diet recommendation: Heart Healthy   Brief/Interim Summary:  Acute lower GI bleed  -Patient presents with a few episodes of bright red blood per rectum, as discussed with GI this is most likely related to diverticular bleed with known history of diverticulosis as colonoscopy in 2016  showed severe diverticulosis, overall his CBC remained stable during hospital stay, he did not require any blood transfusion, there is no recurrent evidence of lower GI bleed during hospital stay, patient should follow-up with his GI physician at [facility] clinic, and he is to resume aspirin in 3 days.    Renal stone with gas/UTI due to gram-negative rods -Patient doing self cath given neurogenic bladder, so cannot have any dysuria or polyuria or cannot provide any UTI symptoms, but given radiological findings will continue with IV Rocephin to treat UTI, he did receive total of 3 days of IV Rocephin during hospital stay, to be discharged on another 4 days of oral Keflex for total of 7 days treatment, urine culture growing gram-negative rods but time of discharge. .  .   Abnormal hepatic lesions seen in the CT scan  -Patient reports this has been followed closely by [facility], already had MRI done last year . -I have discussed these findings with the patient and he will continue to follow with his [facility]  -Patient will be given a CD copy of his CT abdomen and pelvis.  As well he will be given the radiologist reading report to take his GI at [facility] to compare with the records.    History of diabetes mellitus type 2 -Resume home medications on discharge  Hypertension -Resume home meds  History of thoracic myelopathy takes muscle relaxant does have to be verified.   Chronic kidney disease stage III creatinine is improved when compared to previous.  Follow metabolic panel.   Neurogenic bladder self cath.        Discharge Diagnoses:  Principal Problem:   Lower GI bleed Active Problems:   Thoracic myelopathy   Paraplegia (HCC)   Anemia   Essential hypertension   Diabetes mellitus type 2 in nonobese (HCC)   Acute GI bleeding   Malnutrition of moderate degree   Liver lesion   Urinary tract infection with hematuria    Discharge Instructions  Discharge Instructions     Diet - low sodium heart healthy   Complete by: As directed    Discharge instructions   Complete by: As directed    [place] for PT training and aquatic aerobics  Follow with Primary MD Darnelle Catalan, PA in 7 days   Get CBC, CMP,  checked  by Primary MD next visit.    Activity: As tolerated with Full fall precautions use walker/cane & assistance as needed   Disposition Home    Diet: Heart Healthy   On your next visit with your primary care physician please Get Medicines reviewed and adjusted.   Please request your Prim.MD to go over all Hospital Tests and Procedure/Radiological results at the follow up, please get all Hospital records sent to your Prim MD by signing hospital release before you go home.  If you experience worsening of your admission symptoms, develop shortness of breath, life threatening emergency, suicidal or homicidal thoughts you must seek medical attention immediately by calling 911 or calling your MD immediately  if symptoms less severe.  You Must read complete instructions/literature along with all the possible adverse reactions/side effects for all the Medicines you take and that have been prescribed to you. Take any new Medicines after you have  completely understood and accpet all the possible adverse reactions/side effects.   Do not drive, operating heavy machinery, perform activities at heights, swimming or participation in water activities or provide baby sitting services if your were admitted for syncope or siezures until you have seen by Primary MD or a Neurologist and advised to do so again.  Do not drive when taking Pain medications.    Do not take more than prescribed Pain, Sleep and Anxiety Medications  Special Instructions: If you have smoked or chewed Tobacco  in the last 2 yrs please stop smoking, stop any regular Alcohol  and or any Recreational drug use.  Wear Seat belts while driving.   Please note  You were cared for by a hospitalist during your hospital stay. If you have any questions about your discharge medications or the care you received while you were in the hospital after you are discharged, you can call the unit and asked to speak with the hospitalist on call if the hospitalist that took care of you is not available. Once you are discharged, your primary care physician will handle any further medical issues. Please note that NO REFILLS for any discharge medications will be authorized once you are discharged, as it is imperative that you return to your primary care physician (or establish a relationship with a primary care physician if you do not have one) for your aftercare needs so that they can reassess your need for medications and monitor your lab values.   Increase activity slowly   Complete by: As directed    [place] for PT training and aquatic aerobics      Allergies as of 04/26/2021       Reactions   Lisinopril Other (See Comments)   Angioedema   Enalapril-hctz [enalapril-hydrochlorothiazide] Rash        Medication List     STOP taking these medications    tetracycline 500 MG capsule Commonly known as: SUMYCIN       TAKE these medications    acetaminophen 325 MG tablet Commonly known  as: TYLENOL Take 1-2 tablets (325-650 mg total) by mouth every 4 (four) hours as needed for mild pain.   Alogliptin Benzoate 25 MG Tabs Take 25 tablets by mouth daily with breakfast.   ammonium lactate 12 % cream Commonly known as: AMLACTIN Apply 1 application. topically 2 (two) times daily as needed for dry skin.   aspirin EC 81 MG tablet Take 1 tablet (81 mg total) by mouth daily. Swallow whole. Start taking on: April 30, 2021 What changed: These instructions start on April 30, 2021. If you are unsure what to do until then, ask your doctor or other care provider.   baclofen 20 MG tablet Commonly known as: LIORESAL Take 1 tablet by mouth 3 (three) times daily.   bisacodyl 10 MG suppository Commonly known as: DULCOLAX UNWRAP AND INSERT 1 SUPPOSITORY(10 MG) RECTALLY DAILY AFTER SUPPER What changed:  how much to take how to take this when to take this reasons to take this   cephALEXin 500 MG capsule Commonly known as:  KEFLEX Take 1 capsule (500 mg total) by mouth 2 (two) times daily for 4 days.   Cholecalciferol 100 MCG (4000 UT) Tabs Take 4,000 Units by mouth daily.   dantrolene 100 MG capsule Commonly known as: DANTRIUM Take 1 capsule (100 mg total) by mouth 3 (three) times daily.   empagliflozin 25 MG Tabs tablet Commonly known as: JARDIANCE Take 12.5 mg by mouth daily.   fluticasone 50 MCG/ACT nasal spray Commonly known as: FLONASE Place 2 sprays into both nostrils daily.   gabapentin 400 MG capsule Commonly known as: NEURONTIN Take 800 mg by mouth 3 (three) times daily.   glucose 4 GM chewable tablet Chew 4 tablets by mouth as needed for low blood sugar.   insulin aspart 100 UNIT/ML FlexPen Commonly known as: NOVOLOG Inject 8 Units into the skin at bedtime.   lidocaine 2 % jelly Commonly known as: XYLOCAINE Apply 1 application topically at bedtime as needed (apply to rectum with digital stim).   oxybutynin 5 MG tablet Commonly known as: DITROPAN Take  7.5 mg by mouth 2 (two) times daily.   pioglitazone 30 MG tablet Commonly known as: ACTOS Take 30 mg by mouth daily.   Polyethylene Glycol 3350 Powd Take 1 Dose by mouth daily as needed (constipation). Take one capful by mouth every day in 8 ounces of liquid as needed   polyvinyl alcohol 1.4 % ophthalmic solution Commonly known as: LIQUIFILM TEARS Place 1 drop into both eyes 4 (four) times daily.   senna-docusate 8.6-50 MG tablet Commonly known as: Senokot-S Take 2 tablets by mouth daily.   sertraline 100 MG tablet Commonly known as: ZOLOFT Take 100 mg by mouth daily.   sildenafil 100 MG tablet Commonly known as: VIAGRA Take 100 mg by mouth daily as needed for erectile dysfunction. Take one hour prior to sexual activity **Do NOT exceed 1 dose per 24 hour period**   simvastatin 20 MG tablet Commonly known as: ZOCOR Take 20 mg by mouth daily at 6 PM.        Allergies  Allergen Reactions   Lisinopril Other (See Comments)    Angioedema   Enalapril-Hctz [Enalapril-Hydrochlorothiazide] Rash    Consultations: Gastroenterology Dr. Benson [place]   Procedures/Studies: CT Angio Abd/Pel W and/or Wo Contrast  Result Date: 04/24/2021 CLINICAL DATA:  Lower GI bleed.  30 pound weight loss in 3 months. EXAM: CTA ABDOMEN AND PELVIS WITHOUT AND WITH CONTRAST TECHNIQUE: Multidetector CT imaging of the abdomen and pelvis was performed using the standard protocol during bolus administration of intravenous contrast. Multiplanar reconstructed images and MIPs were obtained and reviewed to evaluate the vascular anatomy. RADIATION DOSE REDUCTION: This exam was performed according to the departmental dose-optimization program which includes automated exposure control, adjustment of the mA and/or kV according to patient size and/or use of iterative reconstruction technique. CONTRAST:  66m OMNIPAQUE IOHEXOL 350 MG/ML SOLN COMPARISON:  None. FINDINGS: VASCULAR Aorta: Atherosclerotic calcifications in the  abdominal aorta without aneurysm, dissection or significant stenosis. Celiac: Variant anatomy with celiac trunk supplying the left gastric artery and the splenic artery. Celiac trunk is widely patent without aneurysm, dissection significant stenosis. SMA: SMA is widely patent. The common hepatic artery originates from the proximal SMA and this is a variant. Main SMA branches are patent. Renals: Both renal arteries are patent without evidence of aneurysm, dissection, vasculitis, fibromuscular dysplasia or significant stenosis. IMA: Patent without evidence of aneurysm, dissection, vasculitis or significant stenosis. Inflow: Patent without evidence of aneurysm, dissection, vasculitis or significant stenosis. Proximal Outflow: Proximal  femoral arteries are patent bilaterally. Veins: Limited evaluation of the venous structures due to the timing of the study. Main portal venous system and renal veins are patent. Review of the MIP images confirms the above findings. NON-VASCULAR Lower chest: Lung bases are clear. Hepatobiliary: Multiple hypodensities in the liver. Largest hypodensity is compatible with a cyst measuring up to 2.4 cm in the posterior right hepatic lobe. However, many of these hypodensities are small and too dense to be characterized as simple cysts. There is a questionable lesion in the subcapsular region of the right hepatic lobe on sequence 11 image 20 that measures roughly 1.4 cm. Normal appearance of the gallbladder. No biliary dilatation. Pancreas: Unremarkable. No pancreatic ductal dilatation or surrounding inflammatory changes. Spleen: Normal in size without focal abnormality. Adrenals/Urinary Tract: Adrenal thickening bilaterally. Findings could represent adrenal hyperplasia. 8 mm calculus in the right kidney lower pole with small amount of adjacent gas. Gas is isolated to this stone region and suspect that the stone is obstructing a small calyx which contains gas. No other signs for infection or  inflammatory changes in the right kidney. No other urinary calculi. Low-density structure in the right kidney interpolar region is suggestive for cyst. Cysts in left kidney lower pole. Negative for hydronephrosis. Normal appearance of the urinary bladder. Stomach/Bowel: Normal appearance of the stomach and duodenum. Normal caliber of the small bowel without obstruction. Multiple diverticula involving the sigmoid colon without evidence for active bleeding or acute inflammation. Normal appendix in the right lower quadrant. Right colon and cecum are slightly redundant which makes interpretation of this area difficult but no evidence for active GI bleeding in this area. No clear evidence for a bowel lesion. Lymphatic: Mildly prominent lymph nodes in the posterior lower mediastinum adjacent to the descending thoracic aorta measuring 8 mm in the short axis on sequence 11 image 8. Otherwise, there is no significant lymph node enlargement in the abdomen or pelvis. Reproductive: Prostate is unremarkable. Other: Negative for ascites.  Negative for free air. Musculoskeletal: No acute bone abnormality. IMPRESSION: VASCULAR 1. 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 2. Variant visceral artery anatomy with the common hepatic artery originating from the SMA. 3. No evidence for active GI bleeding. NON-VASCULAR 1. Multiple small hypodensities throughout the liver. These could represent multiple small cysts but too small to definitively characterize. There is a questionable lesion in the right subcapsular region measuring up to 1.4 cm. Based on the history of recent weight loss, recommend further characterization with an abdominal MRI, with and without contrast, to a exclude neoplastic process. 2. 8 mm calculus in the right kidney lower pole with small amount of adjacent gas. The gas raises concern for localized infection in this area. Overall, no significant inflammatory changes involving the  right kidney. Negative for hydronephrosis. 3. Extensive colonic diverticulosis but no evidence for acute colonic inflammation. These results were called by telephone at the time of interpretation on 04/24/2021 at 1:15 pm to provider [facility] , who verbally acknowledged these results. Electronically Signed   By: Markus Daft M.D.   On: 04/24/2021 13:19     Subjective:  Denies any chest pain, shortness of breath, no BM over last 24 hours, no bright red blood per rectum over last 48 hours since admission.  Discharge Exam: Vitals:   04/26/21 0349 04/26/21 0827  BP: 136/89 128/89  Pulse: 73 93  Resp: 18   Temp: 98.5 F (36.9 C)   SpO2: 100% 100%   Vitals:  04/25/21 1949 04/25/21 2314 04/26/21 0349 04/26/21 0827  BP: 119/78 115/76 136/89 128/89  Pulse: 73 75 73 93  Resp: '18 18 18   '$ Temp: 98.3 F (36.8 C) 98.8 F (37.1 C) 98.5 F (36.9 C)   TempSrc: Oral Oral Oral Oral  SpO2:   100% 100%  Weight:      Height:        General: Pt is alert, awake, not in acute distress Cardiovascular: RRR, S1/S2 +, no rubs, no gallops Respiratory: CTA bilaterally, no wheezing, no rhonchi Abdominal: Soft, NT, ND, bowel sounds + Extremities: no edema, no cyanosis    The results of significant diagnostics from this hospitalization (including imaging, microbiology, ancillary and laboratory) are listed below for reference.     Microbiology: Recent Results (from the past 240 hour(s))  Urine Culture     Status: Abnormal (Preliminary result)   Collection Time: 04/24/21  1:43 PM   Specimen: Urine, Clean Catch  Result Value Ref Range Status   Specimen Description   Final    URINE, CLEAN CATCH Performed at [facility], [address], [place], [place] [address]    Special Requests   Final    NONE Performed at [facility], [address], [place], [place] [address]    Culture (A)  Final    >=100,000 COLONIES/mL KLEBSIELLA PNEUMONIAE >=100,000  COLONIES/mL ESCHERICHIA COLI SUSCEPTIBILITIES TO FOLLOW Performed at [facility] [address] [place], [place] [address]    Report Status PENDING  Incomplete  Resp Panel by RT-PCR (Flu A&B, Covid) Nasopharyngeal Swab     Status: None   Collection Time: 04/24/21  2:10 PM   Specimen: Nasopharyngeal Swab; Nasopharyngeal(NP) swabs in vial transport medium  Result Value Ref Range Status   SARS Coronavirus 2 by RT PCR NEGATIVE NEGATIVE Final    Comment: (NOTE) SARS-CoV-2 target nucleic acids are NOT DETECTED.  The SARS-CoV-2 RNA is generally detectable in upper respiratory specimens during the acute phase of infection. The lowest concentration of SARS-CoV-2 viral copies this assay can detect is 138 copies/mL. A negative result does not preclude SARS-Cov-2 infection and should not be used as the sole basis for treatment or other patient management decisions. A negative result may occur with  improper specimen collection/handling, submission of specimen other than nasopharyngeal swab, presence of viral mutation(s) within the areas targeted by this assay, and inadequate number of viral copies(<138 copies/mL). A negative result must be combined with clinical observations, patient history, and epidemiological information. The expected result is Negative.  Fact Sheet for Patients:  EntrepreneurPulse.com.au  Fact Sheet for Healthcare Providers:  IncredibleEmployment.be  This test is no t yet approved or cleared by the [place] FDA and  has been authorized for detection and/or diagnosis of SARS-CoV-2 by FDA under an Emergency Use Authorization (EUA). This EUA will remain  in effect (meaning this test can be used) for the duration of the COVID-19 declaration under Section 564(b)(1) of the Act, 21 U.S.C.section 360bbb-3(b)(1), unless the authorization is terminated  or revoked sooner.       Influenza A by PCR NEGATIVE NEGATIVE Final    Influenza B by PCR NEGATIVE NEGATIVE Final    Comment: (NOTE) The Xpert Xpress SARS-CoV-2/FLU/RSV plus assay is intended as an aid in the diagnosis of influenza from Nasopharyngeal swab specimens and should not be used as a sole basis for treatment. Nasal washings and aspirates are unacceptable for Xpert Xpress SARS-CoV-2/FLU/RSV testing.  Fact Sheet for Patients: EntrepreneurPulse.com.au  Fact Sheet  for Healthcare Providers: IncredibleEmployment.be  This test is not yet approved or cleared by the [place] and has been authorized for detection and/or diagnosis of SARS-CoV-2 by FDA under an Emergency Use Authorization (EUA). This EUA will remain in effect (meaning this test can be used) for the duration of the COVID-19 declaration under Section 564(b)(1) of the Act, 21 U.S.C. section 360bbb-3(b)(1), unless the authorization is terminated or revoked.  Performed at [facility], [address] [place], [place] [address]      Labs: BNP (last 3 results) No results for input(s): BNP in the last 8760 hours. Basic Metabolic Panel: Recent Labs  Lab 04/24/21 0921 04/25/21 0350 04/26/21 0459  NA 137 142 139  K 4.1 4.2 3.5  CL 104 110 107  CO2 '26 24 24  '$ GLUCOSE 141* 104* 120*  BUN 37* 22 13  CREATININE 1.40* 1.19 1.18  CALCIUM 9.6 9.4 9.5   Liver Function Tests: Recent Labs  Lab 04/24/21 0921  AST 12*  ALT 11  ALKPHOS 51  BILITOT 0.3  PROT 7.3  ALBUMIN 3.5   No results for input(s): LIPASE, AMYLASE in the last 168 hours. No results for input(s): AMMONIA in the last 168 hours. CBC: Recent Labs  Lab 04/25/21 0017 04/25/21 0350 04/25/21 0733 04/25/21 1542 04/26/21 0459  WBC 9.2 7.4 7.9 7.8 7.7  HGB 7.9* 8.6* 8.5* 8.1* 8.8*  HCT 24.7* 27.5* 27.4* 25.4* 27.8*  MCV 78.4* 78.6* 78.3* 77.4* 77.0*  PLT 194 210 214 213 223   Cardiac Enzymes: No results for input(s): CKTOTAL, CKMB, CKMBINDEX, TROPONINI  in the last 168 hours. BNP: Invalid input(s): POCBNP CBG: Recent Labs  Lab 04/25/21 0748 04/25/21 1157 04/25/21 1627 04/25/21 2020 04/26/21 0831  GLUCAP 115* 131* 110* 147* 127*   D-Dimer No results for input(s): DDIMER in the last 72 hours. Hgb A1c No results for input(s): HGBA1C in the last 72 hours. Lipid Profile No results for input(s): CHOL, HDL, LDLCALC, TRIG, CHOLHDL, LDLDIRECT in the last 72 hours. Thyroid function studies No results for input(s): TSH, T4TOTAL, T3FREE, THYROIDAB in the last 72 hours.  Invalid input(s): FREET3 Anemia work up No results for input(s): VITAMINB12, FOLATE, FERRITIN, TIBC, IRON, RETICCTPCT in the last 72 hours. Urinalysis    Component Value Date/Time   COLORURINE YELLOW 04/24/2021 1343   APPEARANCEUR HAZY (A) 04/24/2021 1343   LABSPEC 1.037 (H) 04/24/2021 1343   PHURINE 5.5 04/24/2021 1343   GLUCOSEU 500 (A) 04/24/2021 1343   HGBUR NEGATIVE 04/24/2021 1343   BILIRUBINUR NEGATIVE 04/24/2021 1343   KETONESUR NEGATIVE 04/24/2021 1343   PROTEINUR NEGATIVE 04/24/2021 1343   NITRITE POSITIVE (A) 04/24/2021 1343   LEUKOCYTESUR LARGE (A) 04/24/2021 1343   Sepsis Labs Invalid input(s): PROCALCITONIN,  WBC,  LACTICIDVEN Microbiology Recent Results (from the past 240 hour(s))  Urine Culture     Status: Abnormal (Preliminary result)   Collection Time: 04/24/21  1:43 PM   Specimen: Urine, Clean Catch  Result Value Ref Range Status   Specimen Description   Final    URINE, CLEAN CATCH Performed at [facility], [address], [place], [place] [address]    Special Requests   Final    NONE Performed at [facility], [address] [place] [address] [place], [place] [address]    Culture (A)  Final    >=100,000 COLONIES/mL KLEBSIELLA PNEUMONIAE >=100,000 COLONIES/mL ESCHERICHIA COLI SUSCEPTIBILITIES TO FOLLOW Performed at [name], [place] [address], [place], [place] [address]    Report Status  PENDING   Incomplete  Resp Panel by RT-PCR (Flu A&B, Covid) Nasopharyngeal Swab     Status: None   Collection Time: 04/24/21  2:10 PM   Specimen: Nasopharyngeal Swab; Nasopharyngeal(NP) swabs in vial transport medium  Result Value Ref Range Status   SARS Coronavirus 2 by RT PCR NEGATIVE NEGATIVE Final    Comment: (NOTE) SARS-CoV-2 target nucleic acids are NOT DETECTED.  The SARS-CoV-2 RNA is generally detectable in upper respiratory specimens during the acute phase of infection. The lowest concentration of SARS-CoV-2 viral copies this assay can detect is 138 copies/mL. A negative result does not preclude SARS-Cov-2 infection and should not be used as the sole basis for treatment or other patient management decisions. A negative result may occur with  improper specimen collection/handling, submission of specimen other than nasopharyngeal swab, presence of viral mutation(s) within the areas targeted by this assay, and inadequate number of viral copies(<138 copies/mL). A negative result must be combined with clinical observations, patient history, and epidemiological information. The expected result is Negative.  Fact Sheet for Patients:  EntrepreneurPulse.com.au  Fact Sheet for Healthcare Providers:  IncredibleEmployment.be  This test is no t yet approved or cleared by the [place] FDA and  has been authorized for detection and/or diagnosis of SARS-CoV-2 by FDA under an Emergency Use Authorization (EUA). This EUA will remain  in effect (meaning this test can be used) for the duration of the COVID-19 declaration under Section 564(b)(1) of the Act, 21 U.S.C.section 360bbb-3(b)(1), unless the authorization is terminated  or revoked sooner.       Influenza A by PCR NEGATIVE NEGATIVE Final   Influenza B by PCR NEGATIVE NEGATIVE Final    Comment: (NOTE) The Xpert Xpress SARS-CoV-2/FLU/RSV plus assay is intended as an aid in the diagnosis of influenza  from Nasopharyngeal swab specimens and should not be used as a sole basis for treatment. Nasal washings and aspirates are unacceptable for Xpert Xpress SARS-CoV-2/FLU/RSV testing.  Fact Sheet for Patients: EntrepreneurPulse.com.au  Fact Sheet for Healthcare Providers: IncredibleEmployment.be  This test is not yet approved or cleared by the [place] FDA and has been authorized for detection and/or diagnosis of SARS-CoV-2 by FDA under an Emergency Use Authorization (EUA). This EUA will remain in effect (meaning this test can be used) for the duration of the COVID-19 declaration under Section 564(b)(1) of the Act, 21 U.S.C. section 360bbb-3(b)(1), unless the authorization is terminated or revoked.  Performed at [facility], [address] [place], [place] [address]      Time coordinating discharge: Over 30 minutes  SIGNED:   Phillips Climes, MD  [facility] 04/26/2021, 11:30 AM Pager   If 7PM-7AM, please contact night-coverage [email or web address] Password [email or web address]

## 2021-04-26 NOTE — Evaluation (Signed)
Occupational Therapy Evaluation Patient Details Name: Timothy Davenport MRN: [id number] DOB: February 05, 1951 Today's Date: 04/26/2021   History of Present Illness Pt is a 71 y.o. M who presents with hematochezia.  In the ER his HGB was 8.6 g/dL and his baseline is around 11-12 g/dL. CTA performed and no evidence of active bleeding. Multiple lesions noted in liver. Pt reported he already had MRI at [facility] and he is to have routine follow up. Significant PMH: DM, diabetic neuropathy, HTN, neurogenic bowel/bladder, throacic myelopathy.   Clinical Impression   PTA, pt lives with spouse, reports typically Modified Independent with ADLs/mobility using wheelchair primarily (though does ambulate with RW occasionally in the home). Pt has PCA assist for IADLs 3x/wk. Pt denies any recent falls. Pt presents now with minor deficits in coordination, standing balance though likely not far from baseline (wears custom orthotic shoes, R AFO not present on eval which likely impacts mobility). Pt overall Setup for UB ADLs, min guard for LB ADLs, and min guard for short mobility using RW. Anticipate if pt primarily using wheelchair in the home for ADLs/mobility, pt is at a lower risk for falls with this method. Pt interested in continuing OP PT to further progress gait. Anticipate no OT needs at DC but will follow acutely to maximize ADL/mobility safety and educate in UE HEP to maximize strength for daily tasks.      Recommendations for follow up therapy are one component of a multi-disciplinary discharge planning process, led by the attending physician.  Recommendations may be updated based on patient status, additional functional criteria and insurance authorization.   Follow Up Recommendations  No OT follow up (would like to continue OP PT)    Assistance Recommended at Discharge Set up Supervision/Assistance  Patient can return home with the following A little help with bathing/dressing/bathroom;Assistance with  cooking/housework;Help with stairs or ramp for entrance;Assist for transportation    Functional Status Assessment  Patient has had a recent decline in their functional status and demonstrates the ability to make significant improvements in function in a reasonable and predictable amount of time.  Equipment Recommendations  None recommended by OT (well equipped)    Recommendations for Other Services       Precautions / Restrictions Precautions Precautions: Fall Precaution Comments: wears R AFO baseline; orthotic shoes due to L LE longer than R LE Restrictions Weight Bearing Restrictions: No      Mobility Bed Mobility Overal bed mobility: Modified Independent                  Transfers Overall transfer level: Needs assistance Equipment used: Rolling walker (2 wheels) Transfers: Sit to/from Stand Sit to Stand: Min guard           General transfer comment: increased effort and time to gain balance, coordination deficits      Balance Overall balance assessment: Needs assistance Sitting-balance support: Feet supported Sitting balance-Leahy Scale: Good     Standing balance support: Bilateral upper extremity supported Standing balance-Leahy Scale: Poor Standing balance comment: reliant on RW                           ADL either performed or assessed with clinical judgement   ADL Overall ADL's : Needs assistance/impaired Eating/Feeding: Independent;Sitting   Grooming: Supervision/safety;Standing   Upper Body Bathing: Set up;Sitting   Lower Body Bathing: Sit to/from stand;Sitting/lateral leans;Min guard   Upper Body Dressing : Set up;Sitting   Lower Body Dressing: Sitting/lateral  leans;Sit to/from stand;Min guard Lower Body Dressing Details (indicate cue type and reason): able to don tennis shoes sitting EOB with figure four position. Reports getting dressed sitting on EOB, in wheelchair, etc Toilet Transfer: Min guard;Ambulation;Rolling walker  (2 wheels)   Toileting- Clothing Manipulation and Hygiene: Min guard;Sit to/from stand       Functional mobility during ADLs: Min guard;Rolling walker (2 wheels) General ADL Comments: Pt with noted coordination deficits during mobility (uses AFO and orthotic shoes due to L LE longer than R LE; without these - gait abilities likely impacted). Pt uses wheelchair primarily in the home, likely safer and more efficient for pt to complete ADLs wheelchair level at home if assist not present     Vision Baseline Vision/History: 1 Wears glasses Ability to See in Adequate Light: 0 Adequate Vision Assessment?: No apparent visual deficits     Perception     Praxis      Pertinent Vitals/Pain Pain Assessment Pain Assessment: No/denies pain     Hand Dominance Right   Extremity/Trunk Assessment Upper Extremity Assessment Upper Extremity Assessment: Overall WFL for tasks assessed   Lower Extremity Assessment Lower Extremity Assessment: Defer to PT evaluation   Cervical / Trunk Assessment Cervical / Trunk Assessment: Normal   Communication Communication Communication: No difficulties   Cognition Arousal/Alertness: Awake/alert Behavior During Therapy: WFL for tasks assessed/performed Overall Cognitive Status: Within Functional Limits for tasks assessed                                       General Comments  VSS on RA - difficulty with tele leads - RN aware    Exercises     Shoulder Instructions      Home Living Family/patient expects to be discharged to:: Private residence Living Arrangements: Spouse/significant other Available Help at Discharge: Family;Personal care attendant Type of Home: House Home Access: Stairs to enter;Ramped entrance Entrance Stairs-Number of Steps: 1   Home Layout: One level     Bathroom Shower/Tub: Walk-in shower ("roll-in shower")   Bathroom Toilet: Handicapped height (riser over toilet) Bathroom Accessibility: Yes   Home  Equipment: Wheelchair - Publishing copy (2 wheels);Other (comment);Toilet riser;Cane - single point;Wheelchair - power (shower wheelchair)   Additional Comments: PCA assist 3x/wk for 3 hours      Prior Functioning/Environment Prior Level of Function : Needs assist             Mobility Comments: walking 300 ft with walker but reports using wheelchair for mobility around house primarily (80% of the time per pt) ADLs Comments: typically independent with ADLs. PCA occasionally will assist with transfers out of shower. aide assists with IADLs (cooking, dishes, vacuuming).        OT Problem List: Decreased strength;Decreased activity tolerance;Impaired balance (sitting and/or standing);Decreased coordination      OT Treatment/Interventions: Self-care/ADL training;Therapeutic exercise;Energy conservation;DME and/or AE instruction;Therapeutic activities;Patient/family education    OT Goals(Current goals can be found in the care plan section) Acute Rehab OT Goals Patient Stated Goal: go home today if possible OT Goal Formulation: With patient Time For Goal Achievement: 05/10/21 Potential to Achieve Goals: Good ADL Goals Pt/caregiver will Perform Home Exercise Program: Increased strength;Both right and left upper extremity;With theraband;Independently;With written HEP provided Additional ADL Goal #1: Pt to demo ability to gather ADL items MOD I using most appropriate DME Additional ADL Goal #2: Pt to verbalize at least 3 fall prevention strategies to  implement in home environment  OT Frequency: Min 2X/week    Co-evaluation              AM-PAC OT "6 Clicks" Daily Activity     Outcome Measure Help from another person eating meals?: None Help from another person taking care of personal grooming?: A Little Help from another person toileting, which includes using toliet, bedpan, or urinal?: A Little Help from another person bathing (including washing, rinsing, drying)?: A  Little Help from another person to put on and taking off regular upper body clothing?: A Little Help from another person to put on and taking off regular lower body clothing?: A Little 6 Click Score: 19   End of Session Equipment Utilized During Treatment: Rolling walker (2 wheels) Nurse Communication: Mobility status;Other (comment) (tele leads)  Activity Tolerance: Patient tolerated treatment well Patient left: in bed;with call bell/phone within reach  OT Visit Diagnosis: Unsteadiness on feet (R26.81);Other abnormalities of gait and mobility (R26.89);Muscle weakness (generalized) (M62.81)                Time: 4967-5916 OT Time Calculation (min): 20 min Charges:  OT General Charges $OT Visit: 1 Visit OT Evaluation $OT Eval Moderate Complexity: 1 Mod  Malachy Chamber, OTR/L Acute Rehab Services Office: [phone]   Layla Maw 04/26/2021, 11:04 AM

## 2021-04-26 NOTE — Progress Notes (Signed)
?  Transition of Care (TOC) Screening Note ? ? ?Patient Details  ?Name: Timothy Davenport ?Date of Birth: [birth year] ? ? ?Transition of Care (TOC) CM/SW Contact:    ?Cyndi Bender, RN ?Phone Number: ?04/26/2021, 9:14 AM ? ? ? ?Transition of Care Department [facility]) has reviewed patient and no TOC needs have been identified at this time. We will continue to monitor patient advancement through interdisciplinary progression rounds. If new patient transition needs arise, please place a TOC consult. ? ? ?

## 2021-04-26 NOTE — Progress Notes (Signed)
Online [facility] notification completed: ID# [id number] ? ?Durinda Buzzelli ?LCSW, MSW, MHA ? ?

## 2021-04-26 NOTE — Progress Notes (Signed)
[name] to be D/C'd Home per MD order.  Discussed with the patient and all questions fully answered. ? ?VSS, Skin clean, dry and intact without evidence of skin break down, no evidence of skin tears noted. ?IV catheter discontinued intact. Site without signs and symptoms of complications. Dressing and pressure applied. ? ?An After Visit Summary was printed and given to the patient. Patient received prescription. ? ?D/c education completed with patient/family including follow up instructions, medication list, d/c activities limitations if indicated, with other d/c instructions as indicated by MD - patient able to verbalize understanding, all questions fully answered.  ? ?Patient instructed to return to ED, call 911, or call MD for any changes in condition.  ? ?Patient escorted via [place], and D/C home via private auto. ? ?Timothy Davenport ?04/26/2021 3:14 PM  ?

## 2021-04-27 LAB — URINE CULTURE: Culture: >=100000 — AB

## 2021-05-01 ENCOUNTER — Ambulatory Visit: Payer: No Typology Code available for payment source | Admitting: Physical Therapy

## 2021-05-03 ENCOUNTER — Ambulatory Visit: Payer: No Typology Code available for payment source | Admitting: Physical Therapy

## 2021-05-03 ENCOUNTER — Other Ambulatory Visit: Payer: Self-pay

## 2021-05-03 ENCOUNTER — Encounter: Payer: Self-pay | Admitting: Physical Therapy

## 2021-05-03 DIAGNOSIS — M6281 Muscle weakness (generalized): Secondary | ICD-10-CM | POA: Diagnosis not present

## 2021-05-03 DIAGNOSIS — R293 Abnormal posture: Secondary | ICD-10-CM

## 2021-05-03 DIAGNOSIS — R262 Difficulty in walking, not elsewhere classified: Secondary | ICD-10-CM

## 2021-05-03 NOTE — Therapy (Signed)
[facility] [facility] ?[place], [name], [address] ?Phone: [phone]   Fax:  [phone] ? ?Physical Therapy Treatment ? ?Patient Details  ?Name: Timothy Davenport ?MRN: [id number] ?Date of Birth: December 26, 1950 ?Referring Provider (PT): Piva, Enrico,DO  ? ? ?Encounter Date: 05/03/2021 ? ? PT End of Session - 05/03/21 1046   ? ? Visit Number 18   ? Number of Visits 92   ? Date for PT Re-Evaluation 17/49/44   per re-cert on 9/67/59  ? Authorization Type [facility] - 15 visits from 03/20/21 - 09/16/21   ? Authorization - Visit Number 6   ? Authorization - Number of Visits 15   ? Progress Note Due on Visit 70   ? PT Start Time 1045   PT able to start pt early  ? PT Stop Time 1130   ? PT Time Calculation (min) 45 min   ? Equipment Utilized During Treatment Gait belt   ? Activity Tolerance Patient tolerated treatment well;Patient limited by fatigue   ? Behavior During Therapy [facility] for tasks assessed/performed   ? ?  ?  ? ?  ? ? ?Past Medical History:  ?Diagnosis Date  ? Colon polyp   ? Diabetes mellitus without complication ([place])   ? Diabetic neuropathy ([place])   ? HTN (hypertension)   ? Patella fracture   ? Renal calculi   ? ? ?Past Surgical History:  ?Procedure Laterality Date  ? LUMBAR LAMINECTOMY/DECOMPRESSION MICRODISCECTOMY N/A 07/01/2019  ? Procedure: LUMBAR LAMINECTOMY/DECOMPRESSION MICRODISCECTOMY 1 LEVEL, THORACIC SIX-SEVEN;  Surgeon: Consuella Lose, MD;  Location: [facility];  Service: Neurosurgery;  Laterality: N/A;  LUMBAR LAMINECTOMY/DECOMPRESSION MICRODISCECTOMY 1 LEVEL, THORACIC SIX-SEVEN   ? ORIF PATELLA    ? THORACIC DISCECTOMY N/A 07/03/2019  ? Procedure: Evacuation of Thoracic Hematoma;  Surgeon: Consuella Lose, MD;  Location: [facility];  Service: Neurosurgery;  Laterality: N/A;  ? ? ?There were no vitals filed for this visit. ? ? Subjective Assessment - 05/03/21 1047   ? ? Subjective Was hospitalized for a lower GI bleed on 04/24/21. Had to take antibiotics.  Discharged 04/26/21. Eating a lot more now. Had bloodwork done at his doctor's yesterday due to his weight loss (at the [place]) and waiting to figure out the results. Working on getting a new w/c seat and seat cover from the [place] (has a virtual visit on march 24). Has to work on figuring out transportation as [employer] is ending at the end of the month.   ? Pertinent History T8 paraplegia, diabetes, HTN   ? How long can you stand comfortably? probably a couple minutes with RW.   ? How long can you walk comfortably? approx. 5-10 minutes with his son and RW.   ? Patient Stated Goals wants to be able to walk without any assistance   ? Currently in Pain? No/denies   ? ?  ?  ? ?  ? ? ? ? ? ? ? ? ? ? ? ? ? ? ? ? ?Access Code: [email or web address] ?URL: [email or web address] ?Date: 05/03/2021 ?Prepared by: Janann August ? ?Program Notes ?lying on your stomach for hip stretch, sitting long on your bed with feet out straight for hamstring stretch, gently bring them out in a V and reach forwards for inner thigh stretch  ? ?Reviewed/updated bolded exercises for HEP:  ? ?Exercises ?Modified Thomas Stretch - 1 x daily - 5 x weekly - 1 sets - 10 reps ?Seated Long Arc Quad - 1 x daily - 5  x weekly - 1 sets - 10 reps - pt unable to perform with RLE today, prior pt was performing with 2# ankle weight, discussed performing without ankle weight so that pt can perform with more ROM (not able to achieve full ROM with LLE). Attempted to perform short arc quads at end of session instead but pt unable to perform with LLE, pt reports feeling more fatigued at end of session. ?Seated Single Leg Hip Abduction with Resistance - 1 x daily - 5 x weekly - 1 sets - 10 reps ?Seated Hamstring Curl with Anchored Resistance - 1 x daily - 5 x weekly - 1 sets - 10 reps ?Standing March with Counter Support - 1 x daily - 5 x weekly - 1 sets - 10 reps ?Wide Stance with Counter Support - 1 x daily - 5 x weekly - 1 sets - 10 reps - performing at RW with  a mini squat, cues for proper squat technique and incr weight posteriorly.  ?Seated Hamstring Stretch - 1 x daily - 5 x weekly - 3 sets - 30 hold - reviewed holding time  ?Sit to Stand with Counter Support - 1 x daily - 5 x weekly - 2 sets - 5 reps - for pt to perform from mat table with RW  ?Supine Quadricep Sets - 1 x daily - 7 x weekly - 2 sets - 10 reps - for quad activation/hamstring stretch, pt reporting feeling good with these. Discussed would need to have someone help keep his RLE in place or use a pillow  ? ? ? ? ? ? ? ? [facility] Adult PT Treatment/Exercise - 05/03/21 1206   ? ?  ? Transfers  ? Transfers Sit to Stand;Stand to Sit   ? Sit to Stand 4: Min guard;4: Min assist;With upper extremity assist   ? Sit to Stand Details Verbal cues for technique;Verbal cues for precautions/safety;Verbal cues for safe use of DME/AE   ? Sit to Stand Details (indicate cue type and reason) From mat table pt needing min guard and therapist assisting to help stabilize RW, from w/c after bout of gait, needing min A for x2 reps and mod A for last rep due to incr fatigue. Takes incr time to stand and pt with incr knee and hip flexion   ? Stand to Sit 4: Min guard;3: Mod assist;With upper extremity assist   ? Stand to Sit Details Pt needing min guard to sit back to mat table, pt needing mod A when sitting back to manual w/c due to fatigue, foot plate in front and pt really needing to lean back.   ? Lateral/Scoot Transfers 6: Modified independent (Device/Increase time)   ? Lateral/Scoot Transfer Details (indicate cue type and reason) from w/c <> mat table   ? Comments Performed x10 reps sit <> stand at start of session from lower mat table, min guard for balance, therapist helping to steady RW   ?  ? Ambulation/Gait  ? Ambulation/Gait Yes   ? Ambulation/Gait Assistance 4: Min assist;4: Min guard   ? Ambulation/Gait Assistance Details With R AFO, pt needing cues for bilat R>L knee extension and upright posture during gait. Pt needing  w/c follow after 1st lap due to pt needing a seated rest break due to fatigue and incr forward flexed posture. Performed an additional x2 bouts of gait with pt needing min A to help clear RLE during swing phase, otherwise pt would just go up on his R toes. Pt needing an additional  rest break during next bout of 115'   ? Ambulation Distance (Feet) 115 Feet   x1, 95 x 1, 20 x 1  ? Assistive device Rolling walker   ? Gait Pattern Decreased weight shift to right;Left flexed knee in stance;Right flexed knee in stance;Narrow base of support;Decreased step length - right;Decreased step length - left;Decreased stance time - right;Decreased hip/knee flexion - right;Step-through pattern;Poor foot clearance - right   ? Ambulation Surface Level;Indoor   ? ?  ?  ? ?  ? ? ? ? ? ? ? ? ? ? PT Education - 05/03/21 1154   ? ? Education Details Scheduling additional visits, reviewed HEP and proper technique   ? Person(s) Educated Patient   ? Methods Explanation   ? Comprehension Verbalized understanding   ? ?  ?  ? ?  ? ? ? PT Short Term Goals - 03/29/21 1203   ? ?  ? PT SHORT TERM GOAL #1  ? Title Pt will improve 3MWT distance with RW to at least 205' in order to demo improved gait efficiency. ALL STGS DUE 03/29/21   ? Baseline 190' on 02/09/21, pt too fatigued today, will perform at next session.   ? Time 4   ? Period Weeks   ? Status Deferred   ? Target Date 03/29/21   ?  ? PT SHORT TERM GOAL #2  ? Title Pt will perform sit <> stand from lower mat table consistently with min guard in order to demo improved functional transfers/strength/   ? Baseline min guard on 03/29/21, with assist from therapist to steady   ? Time 4   ? Period Weeks   ? Status Achieved   ?  ? PT SHORT TERM GOAL #4  ? Title In aquatic therapy, pt will be able to descend down pool steps with min guard and ascend out of pool steps with mod A with therapist in order to demo improved functional mobility.   ? Baseline will assess at aquatic session   ? Time 4   ? Period  Weeks   ? Status On-going   ? ?  ?  ? ?  ? ? ? ? PT Long Term Goals - 05/03/21 1219   ? ?  ? PT LONG TERM GOAL #1  ? Title Pt and spouse will be independent with final HEP for BLE stretching and strengt

## 2021-05-03 NOTE — Patient Instructions (Signed)
Access Code: [email or web address] ?URL: [email or web address] ?Date: 05/03/2021 ?Prepared by: Janann August ? ?Program Notes ?lying on your stomach for hip stretch, sitting long on your bed with feet out straight for hamstring stretch, gently bring them out in a V and reach forwards for inner thigh stretch  ? ? ?Exercises ?Modified [place] Stretch - 1 x daily - 5 x weekly - 1 sets - 10 reps ?Seated Long Arc Quad - 1 x daily - 5 x weekly - 1 sets - 10 reps ?Seated Single Leg Hip Abduction with Resistance - 1 x daily - 5 x weekly - 1 sets - 10 reps ?Seated Hamstring Curl with Anchored Resistance - 1 x daily - 5 x weekly - 1 sets - 10 reps ?Standing March with Counter Support - 1 x daily - 5 x weekly - 1 sets - 10 reps ?Wide Stance with Counter Support - 1 x daily - 5 x weekly - 1 sets - 10 reps ?Seated Hamstring Stretch - 1 x daily - 5 x weekly - 3 sets - 30 hold ?Sit to Stand with Counter Support - 1 x daily - 5 x weekly - 2 sets - 5 reps ?Supine Quadricep Sets - 1 x daily - 7 x weekly - 2 sets - 10 reps ? ?

## 2021-05-08 ENCOUNTER — Ambulatory Visit: Payer: No Typology Code available for payment source | Admitting: Physical Therapy

## 2021-05-08 ENCOUNTER — Other Ambulatory Visit: Payer: Self-pay

## 2021-05-08 ENCOUNTER — Ambulatory Visit: Payer: Self-pay | Admitting: Physical Therapy

## 2021-05-08 DIAGNOSIS — R293 Abnormal posture: Secondary | ICD-10-CM

## 2021-05-08 DIAGNOSIS — R2681 Unsteadiness on feet: Secondary | ICD-10-CM

## 2021-05-08 DIAGNOSIS — M6281 Muscle weakness (generalized): Secondary | ICD-10-CM | POA: Diagnosis not present

## 2021-05-08 DIAGNOSIS — R262 Difficulty in walking, not elsewhere classified: Secondary | ICD-10-CM

## 2021-05-09 NOTE — Therapy (Addendum)
[facility] [address] [place], [place], [address] Phone: [phone]   Fax:  [phone]  Physical Therapy Treatment  Patient Details  Name: Timothy Davenport MRN: [id number] Date of Birth: 1951-02-13 Referring Provider (PT): Tera Mater    Encounter Date: 05/08/2021   PT End of Session - 05/08/21 2025     Visit Number 84    Number of Visits 92    Date for PT Re-Evaluation 05/30/21   per re-cert on 6/44/03   Authorization Type VA - 15 visits from 03/20/21 - 09/16/21    Authorization - Visit Number 7    Authorization - Number of Visits 15    Progress Note Due on Visit 70    PT Start Time 1400    PT Stop Time 1442    PT Time Calculation (min) 42 min    Equipment Utilized During Treatment Gait belt;Other (comment)   large bar bell, aquatic belt, pool noodle, aquatic ankle wraps   Activity Tolerance Patient tolerated treatment well;Patient limited by fatigue    Behavior During Therapy WFL for tasks assessed/performed             Past Medical History:  Diagnosis Date   Colon polyp    Diabetes mellitus without complication (HCC)    Diabetic neuropathy (HCC)    HTN (hypertension)    Patella fracture    Renal calculi     Past Surgical History:  Procedure Laterality Date   LUMBAR LAMINECTOMY/DECOMPRESSION MICRODISCECTOMY N/A 07/01/2019   Procedure: LUMBAR LAMINECTOMY/DECOMPRESSION MICRODISCECTOMY 1 LEVEL, THORACIC SIX-SEVEN;  Surgeon: Lisbeth Renshaw, MD;  Location: [facility] OR;  Service: Neurosurgery;  Laterality: N/A;  LUMBAR LAMINECTOMY/DECOMPRESSION MICRODISCECTOMY 1 LEVEL, THORACIC SIX-SEVEN    ORIF PATELLA     THORACIC DISCECTOMY N/A 07/03/2019   Procedure: Evacuation of Thoracic Hematoma;  Surgeon: Lisbeth Renshaw, MD;  Location: [facility] OR;  Service: Neurosurgery;  Laterality: N/A;    There were no vitals filed for this visit.  Aquatic therapy at [facility] - pool temp 94 degrees    Patient seen for  aquatic therapy today.  Treatment took place in water 3.5-4.5 feet deep depending upon activity.  Pt entered/exited pool via stairs with bil rails, step to pattern with min guard assist.  Use of large bar bell for gait into ~4.3 to 4.5 foot depth water with min assist. Forward gait for 18 feet x 8 laps with emphasis on posture and large steps Backward gait for 18 feet x 8 laps with emphasis on posture and large steps Side stepping for 18 feet x 6 laps toward each side with cues to keep hips/feet forward and for step length.  Holding to wall in ~4.5 foot depth water With aquatic cuffs to bil LE's Alternating hip abd/add x 15 reps each side Alternating hip extension x 15 reps each side Alternating slow high knee marching x 15 reps each side Alternating HS curls x 15 reps each side  Use of large bar bell for gait from deeper end of pool to bench in water Seated on bench with 2.5# ankle weights, cues on form and technique Alternating long arc quads x 15 each side Scissor kicks x 15 reps Hip abd/add with LE's floating in water for 15 reps  Without ankle weights with UE support on large bar bell for balance stability in standing, min assist Sit<>stands with feet in parallel position x 10 reps Then with feet in staggered position for 10 reps each foot forward.   Use of  large bar bell/wall of pool for gait from bench to stairs     Pt requires buoyancy of water for support for reduced fall risk with gait training and balance exercises with minimal UE support; exercises able to be performed safely in water without the risk of fall compared to those same exercises performed on land;  viscosity of water needed for resistance for strengthening.  Current of water provides perturbations for challenging static & dynamic standing balance.             PT Short Term Goals - 03/29/21 1203       PT SHORT TERM GOAL #1   Title Pt will improve distance with RW to at least 205' in order to demo  improved gait efficiency. ALL STGS DUE 03/29/21    Baseline 190' on 02/09/21, pt too fatigued today, will perform at next session.    Time 4    Period Weeks    Status Deferred    Target Date 03/29/21      PT SHORT TERM GOAL #2   Title Pt will perform sit <> stand from lower mat table consistently with min guard in order to demo improved functional transfers/strength/    Baseline min guard on 03/29/21, with assist from therapist to steady    Time 4    Period Weeks    Status Achieved      PT SHORT TERM GOAL #4   Title In aquatic therapy, pt will be able to descend down pool steps with min guard and ascend out of pool steps with mod A with therapist in order to demo improved functional mobility.    Baseline will assess at aquatic session    Time 4    Period Weeks    Status On-going               PT Long Term Goals - 05/03/21 1347       PT LONG TERM GOAL #1   Title Pt and spouse will be independent with final HEP for BLE stretching and strengthening as well as aquatic HEP. ALL LTGS DUE 05/30/21    Baseline needed review/update on exercises on 05/03/21, will continue to benefit for review and updates and additions for aquatic HEP    Time 12    Period Weeks    Status Revised    Target Date 05/30/21      PT LONG TERM GOAL #2   Title Pt will improve gait speed with RW and R AFO to at least 1.05 ft/sec in order to demo improved household mobility.    Baseline 34.19 seconds = .95 ft/sec on 03/01/21, not assessed on 05/03/21 due to fatigue    Time 12    Period Weeks    Status On-going      PT LONG TERM GOAL #4   Title Pt will stand at [facility] with min guard with no UE support with min guard for at least 20 seconds in order to demo improved balance for ADLs    Baseline 32.84 seconds on 09/12/20; 1 minute 4 seconds on 11/15/20, best time of 22 seconds on 03/01/21    Time 12    Period Weeks    Status Revised      PT LONG TERM GOAL #5   Title Pt will ambulate at least 230' over level surfaces  with RW and min guard consistently in order to demo improved household mobility and improved endurance    Baseline 230' (in 3 bouts) with  min guard initially and then min A due to fatigue.    Time 12    Period Weeks    Status Revised                   Plan - 05/08/21 2027     Clinical Impression Statement Today's skilled session focused on gait, strengthening and stretching in the aquatic setting with no issues noted or reported in session. The pt is making steady progress toward goals and should benefit from further PT to progress toward unmet goals.    Personal Factors and Comorbidities Comorbidity 3+;Past/Current Experience    Comorbidities T8 paraplegia, diabetes, HTN    Examination-Activity Limitations Locomotion Level;Transfers;Stand    Examination-Participation Restrictions Community Activity;Yard Work    Conservation officer, historic buildings Evolving/Moderate complexity    Rehab Potential Good    PT Frequency 2x / week    PT Duration 12 weeks    PT Treatment/Interventions Aquatic Therapy;Manual techniques;Therapeutic exercise;Gait training;Neuromuscular re-education;Orthotic Fit/Training    PT Next Visit Plan continue to work on gait distance, SciFit for endurance/ROM/strengthening. BLE strengthening. standing tolerance with decreased UE support, quadruped/tall kneeling for hip/core strengthening.    PT Home Exercise Plan Access Code: [email or web address]    Consulted and Agree with Plan of Care Patient             Patient will benefit from skilled therapeutic intervention in order to improve the following deficits and impairments:  Decreased balance, Decreased coordination, Decreased range of motion, Decreased strength, Decreased mobility, Postural dysfunction, Impaired tone, Decreased activity tolerance, Abnormal gait, Decreased endurance, Difficulty walking, Impaired sensation  Visit Diagnosis: Difficulty in walking, not elsewhere classified  Muscle weakness  (generalized)  Abnormal posture  Unsteadiness on feet     Problem List Patient Active Problem List   Diagnosis Date Noted   Malnutrition of moderate degree 04/25/2021   Liver lesion    Urinary tract infection with hematuria    Lower GI bleed 04/24/2021   Anemia 04/24/2021   Essential hypertension 04/24/2021   Diabetes mellitus type 2 in nonobese (HCC) 04/24/2021   Acute GI bleeding 04/24/2021   Erectile dysfunction due to diseases classified elsewhere 03/02/2020   Wheelchair dependence 11/27/2019   Spasticity 08/03/2019   Paraplegia (HCC) 07/07/2019   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 [place], [place], [address] Phone: [phone]   Fax:  [phone]  Name: Timothy Davenport MRN: [id number] Date of Birth: April 02, 1950

## 2021-05-10 ENCOUNTER — Ambulatory Visit: Payer: No Typology Code available for payment source | Admitting: Physical Therapy

## 2021-05-15 ENCOUNTER — Ambulatory Visit: Payer: Self-pay | Admitting: Physical Therapy

## 2021-05-15 ENCOUNTER — Ambulatory Visit: Payer: No Typology Code available for payment source | Admitting: Physical Therapy

## 2021-05-17 ENCOUNTER — Ambulatory Visit: Payer: No Typology Code available for payment source | Admitting: Physical Therapy

## 2021-05-17 ENCOUNTER — Encounter: Payer: Self-pay | Admitting: Physical Therapy

## 2021-05-17 DIAGNOSIS — R293 Abnormal posture: Secondary | ICD-10-CM

## 2021-05-17 DIAGNOSIS — M6281 Muscle weakness (generalized): Secondary | ICD-10-CM | POA: Diagnosis not present

## 2021-05-17 DIAGNOSIS — R262 Difficulty in walking, not elsewhere classified: Secondary | ICD-10-CM

## 2021-05-17 DIAGNOSIS — R2681 Unsteadiness on feet: Secondary | ICD-10-CM

## 2021-05-18 NOTE — Therapy (Signed)
[facility], [name], [address] ?Phone: [phone]   Fax:  [phone] ? ?Physical Therapy Treatment ? ?Patient Details  ?Name: JAMARQUES PINEDO ?MRN: [id number] ?Date of Birth: [birth year] ?Referring Provider (PT): Piva, Enrico,DO  ? ? ?Encounter Date: 05/17/2021 ? ? PT End of Session - 05/17/21 1317   ? ? Visit Number 85   ? Number of Visits 92   ? Date for PT Re-Evaluation 54/65/68   per re-cert on 03/17/49  ? Authorization Type VA - 15 visits from 03/20/21 - 09/16/21   ? Authorization - Visit Number 8   ? Authorization - Number of Visits 15   ? Progress Note Due on Visit 70   ? PT Start Time 1315   ? PT Stop Time 1358   ? PT Time Calculation (min) 43 min   ? Equipment Utilized During Treatment Gait belt   ? Activity Tolerance Patient tolerated treatment well   ? Behavior During Therapy [facility] for tasks assessed/performed   ? ?  ?  ? ?  ? ? ?Past Medical History:  ?Diagnosis Date  ? Colon polyp   ? Diabetes mellitus without complication ([place])   ? Diabetic neuropathy ([place])   ? HTN (hypertension)   ? Patella fracture   ? Renal calculi   ? ? ?Past Surgical History:  ?Procedure Laterality Date  ? LUMBAR LAMINECTOMY/DECOMPRESSION MICRODISCECTOMY N/A 07/01/2019  ? Procedure: LUMBAR LAMINECTOMY/DECOMPRESSION MICRODISCECTOMY 1 LEVEL, THORACIC SIX-SEVEN;  Surgeon: Consuella Lose, MD;  Location: [place];  Service: Neurosurgery;  Laterality: N/A;  LUMBAR LAMINECTOMY/DECOMPRESSION MICRODISCECTOMY 1 LEVEL, THORACIC SIX-SEVEN   ? ORIF PATELLA    ? THORACIC DISCECTOMY N/A 07/03/2019  ? Procedure: Evacuation of Thoracic Hematoma;  Surgeon: Consuella Lose, MD;  Location: [place];  Service: Neurosurgery;  Laterality: N/A;  ? ? ?There were no vitals filed for this visit. ? ? Subjective Assessment - 05/17/21 1317   ? ? Subjective No new complaints. No falls or pain to report. Walking with son/caregiver at home. No issues with HEP.   ? Pertinent History T8  paraplegia, diabetes, HTN   ? How long can you stand comfortably? probably a couple minutes with RW.   ? How long can you walk comfortably? approx. 5-10 minutes with his son and RW.   ? Patient Stated Goals wants to be able to walk without any assistance   ? Currently in Pain? No/denies   ? Pain Score 0-No pain   ? ?  ?  ? ?  ? ? ? ? ? ? ? ? ? [facility] Adult PT Treatment/Exercise - 05/17/21 1321   ? ?  ? Transfers  ? Transfers Sit to Stand;Stand to Sit   ? Sit to Stand 4: Min guard;4: Min assist;With upper extremity assist   ? Stand to Sit 4: Min guard;3: Mod assist;With upper extremity assist   ?  ? Ambulation/Gait  ? Ambulation/Gait Yes   ? Ambulation/Gait Assistance 4: Min guard   ? Ambulation/Gait Assistance Details pt did not have brace on today so limited gait this session. continued cues for step length and step placement with gait.   ? Ambulation Distance (Feet) 20 Feet   x 2, 35 x1  ? Assistive device Rolling walker   ? Gait Pattern Decreased weight shift to right;Left flexed knee in stance;Right flexed knee in stance;Narrow base of support;Decreased step length - right;Decreased step length - left;Decreased stance time - right;Decreased hip/knee flexion - right;Step-through pattern;Poor foot clearance -  right   ? Ambulation Surface Level;Indoor   ?  ? Knee/Hip Exercises: Aerobic  ? Stepper Scifit with BLE only at level 3.5 for 8 minutes with goal >/= 85 steps per minute for strengthening, ROM, endurance. Cues for full knee exension.   ?  ? Knee/Hip Exercises: Machines for Strengthening  ? Total Gym Leg Press bil LE's 60# 2 sets of 10 reps, then single leg 30# 2 sets of 10 reps each side. cues for controlled movements and full knee extension. guarding on left side to ensure no hyperextension.   ?  ? Knee/Hip Exercises: Seated  ? Other Seated Knee/Hip Exercises with green band resistance around LEs above knees: hip fall outs for 2 sets of 10 reps, then marching for 2 sets of 10. limited range noted with  marching, right>left.   ? ?  ?  ? ?  ? ? ? ? ? ? ? ? ? ? ? ? PT Short Term Goals - 03/29/21 1203   ? ?  ? PT SHORT TERM GOAL #1  ? Title Pt will improve 3MWT distance with RW to at least 205' in order to demo improved gait efficiency. ALL STGS DUE 03/29/21   ? Baseline 190' on 02/09/21, pt too fatigued today, will perform at next session.   ? Time 4   ? Period Weeks   ? Status Deferred   ? Target Date 03/29/21   ?  ? PT SHORT TERM GOAL #2  ? Title Pt will perform sit <> stand from lower mat table consistently with min guard in order to demo improved functional transfers/strength/   ? Baseline min guard on 03/29/21, with assist from therapist to steady   ? Time 4   ? Period Weeks   ? Status Achieved   ?  ? PT SHORT TERM GOAL #4  ? Title In aquatic therapy, pt will be able to descend down pool steps with min guard and ascend out of pool steps with mod A with therapist in order to demo improved functional mobility.   ? Baseline will assess at aquatic session   ? Time 4   ? Period Weeks   ? Status On-going   ? ?  ?  ? ?  ? ? ? ? PT Long Term Goals - 05/03/21 1347   ? ?  ? PT LONG TERM GOAL #1  ? Title Pt and spouse will be independent with final HEP for BLE stretching and strengthening as well as aquatic HEP. ALL LTGS DUE 05/30/21   ? Baseline needed review/update on exercises on 05/03/21, will continue to benefit for review and updates and additions for aquatic HEP   ? Time 12   ? Period Weeks   ? Status Revised   ? Target Date 05/30/21   ?  ? PT LONG TERM GOAL #2  ? Title Pt will improve gait speed with RW and R AFO to at least 1.05 ft/sec in order to demo improved household mobility.   ? Baseline 34.19 seconds = .95 ft/sec on 03/01/21, not assessed on 05/03/21 due to fatigue   ? Time 12   ? Period Weeks   ? Status On-going   ?  ? PT LONG TERM GOAL #4  ? Title Pt will stand at [facility] with min guard with no UE support with min guard for at least 20 seconds in order to demo improved balance for ADLs   ? Baseline 32.84 seconds on  09/12/20; 1 minute 4 seconds on  11/15/20, best time of 22 seconds on 03/01/21   ? Time 12   ? Period Weeks   ? Status Revised   ?  ? PT LONG TERM GOAL #5  ? Title Pt will ambulate at least 230' over level surfaces with RW and min guard consistently in order to demo improved household mobility and improved endurance   ? Baseline 230' (in 3 bouts) with min guard initially and then min A due to fatigue.   ? Time 12   ? Period Weeks   ? Status Revised   ? ?  ?  ? ?  ? ? ? ? ? ? ? ? Plan - 05/17/21 1318   ? ? Clinical Impression Statement Skilled session continued to focus on gait and strengthening with no issues noted or reported in session. The pt is making progress and should benefit from continued PT to progress toward goals.   ? Personal Factors and Comorbidities Comorbidity 3+;Past/Current Experience   ? Comorbidities T8 paraplegia, diabetes, HTN   ? Examination-Activity Limitations Locomotion Level;Transfers;Stand   ? Examination-Participation Restrictions Community Activity;Valla Leaver Work   ? Stability/Clinical Decision Making Evolving/Moderate complexity   ? Rehab Potential Good   ? PT Frequency 2x / week   ? PT Duration 12 weeks   ? PT Treatment/Interventions Aquatic Therapy;Manual techniques;Therapeutic exercise;Gait training;Neuromuscular re-education;Orthotic Fit/Training   ? PT Next Visit Plan continue to work on gait distance, SciFit for endurance/ROM/strengthening. BLE strengthening. standing tolerance with decreased UE support, quadruped/tall kneeling for hip/core strengthening.   ? PT Home Exercise Plan Access Code: [email or web address]   ? Consulted and Agree with Plan of Care Patient   ? ?  ?  ? ?  ? ? ?Patient will benefit from skilled therapeutic intervention in order to improve the following deficits and impairments:  Decreased balance, Decreased coordination, Decreased range of motion, Decreased strength, Decreased mobility, Postural dysfunction, Impaired tone, Decreased activity tolerance, Abnormal gait, Decreased  endurance, Difficulty walking, Impaired sensation ? ?Visit Diagnosis: ?Difficulty in walking, not elsewhere classified ? ?Muscle weakness (generalized) ? ?Unsteadiness on feet ? ?Abnormal posture ? ? ? ? ?Problem

## 2021-05-22 ENCOUNTER — Ambulatory Visit: Payer: No Typology Code available for payment source | Admitting: Physical Therapy

## 2021-05-24 ENCOUNTER — Ambulatory Visit: Payer: No Typology Code available for payment source | Attending: Family Medicine | Admitting: Physical Therapy

## 2021-05-24 ENCOUNTER — Encounter: Payer: Self-pay | Admitting: Physical Therapy

## 2021-05-24 DIAGNOSIS — M6281 Muscle weakness (generalized): Secondary | ICD-10-CM | POA: Diagnosis present

## 2021-05-24 DIAGNOSIS — R262 Difficulty in walking, not elsewhere classified: Secondary | ICD-10-CM | POA: Insufficient documentation

## 2021-05-24 DIAGNOSIS — R2681 Unsteadiness on feet: Secondary | ICD-10-CM | POA: Diagnosis present

## 2021-05-24 DIAGNOSIS — R293 Abnormal posture: Secondary | ICD-10-CM | POA: Diagnosis present

## 2021-05-24 NOTE — Therapy (Signed)
[facility], [name], [address] ?Phone: [phone]   Fax:  [phone] ? ?Physical Therapy Treatment ? ?Patient Details  ?Name: Timothy Davenport ?MRN: [id number] ?Date of Birth: [birth year] ?Referring Provider (PT): Piva, Enrico,DO  ? ? ?Encounter Date: 05/24/2021 ? ? PT End of Session - 05/24/21 1103   ? ? Visit Number 39   ? Number of Visits 92   ? Date for PT Re-Evaluation 58/85/02   per re-cert on 7/74/12  ? Authorization Type VA - 15 visits from 03/20/21 - 09/16/21   ? Authorization - Visit Number 9   ? Authorization - Number of Visits 15   ? Progress Note Due on Visit 70   ? PT Start Time 1101   ? PT Stop Time 8786   ? PT Time Calculation (min) 42 min   ? Equipment Utilized During Treatment Gait belt   ? Activity Tolerance Patient tolerated treatment well   ? Behavior During Therapy [facility] for tasks assessed/performed   ? ?  ?  ? ?  ? ? ?Past Medical History:  ?Diagnosis Date  ? Colon polyp   ? Diabetes mellitus without complication ([place])   ? Diabetic neuropathy ([place])   ? HTN (hypertension)   ? Patella fracture   ? Renal calculi   ? ? ?Past Surgical History:  ?Procedure Laterality Date  ? LUMBAR LAMINECTOMY/DECOMPRESSION MICRODISCECTOMY N/A 07/01/2019  ? Procedure: LUMBAR LAMINECTOMY/DECOMPRESSION MICRODISCECTOMY 1 LEVEL, THORACIC SIX-SEVEN;  Surgeon: Consuella Lose, MD;  Location: [place];  Service: Neurosurgery;  Laterality: N/A;  LUMBAR LAMINECTOMY/DECOMPRESSION MICRODISCECTOMY 1 LEVEL, THORACIC SIX-SEVEN   ? ORIF PATELLA    ? THORACIC DISCECTOMY N/A 07/03/2019  ? Procedure: Evacuation of Thoracic Hematoma;  Surgeon: Consuella Lose, MD;  Location: [place];  Service: Neurosurgery;  Laterality: N/A;  ? ? ?There were no vitals filed for this visit. ? ? Subjective Assessment - 05/24/21 1106   ? ? Subjective Has to have some more imaging on his liver as there is a lesion on it (has had this lesion for a couple of years). Also doing more  tests for his stomach (not sure which ones).   ? Pertinent History T8 paraplegia, diabetes, HTN   ? How long can you stand comfortably? probably a couple minutes with RW.   ? How long can you walk comfortably? approx. 5-10 minutes with his son and RW.   ? Patient Stated Goals wants to be able to walk without any assistance   ? ?  ?  ? ?  ? ? ? ? ? ? ? ? ? ? ? ? ? ? ? ? ? ? ? ? [place] Adult PT Treatment/Exercise - 05/24/21 1122   ? ?  ? Transfers  ? Transfers Sit to Stand;Stand to Sit   ? Sit to Stand 4: Min guard;4: Min assist;With upper extremity assist   ? Sit to Stand Details Verbal cues for technique;Verbal cues for precautions/safety;Verbal cues for safe use of DME/AE   ? Sit to Stand Details (indicate cue type and reason) From lower mat table pt needing min guard and therapist assisting to help stabilize RW, performed x5 reps at start of session, cues for tall posture and hip/knee extension in standing.   ? Stand to Sit 4: Min guard   ? Lateral/Scoot Transfers 6: Modified independent (Device/Increase time)   ? Lateral/Scoot Transfer Details (indicate cue type and reason) from w/c <> mat table   ?  ? Ambulation/Gait  ?  Ambulation/Gait Yes   ? Ambulation/Gait Assistance 4: Min guard;4: Min assist   ? Ambulation/Gait Assistance Details Pt wearing R AFO today. Cues for posture, knee extension and wider step width with RLE to avoid LLE getting caught on it (this happened 2x but pt able to self correct). After 100' pt had an episode of R knee buckling, requiring min A and PT had another therapist bring pt's w/c behind him for a seated rest break. Was able to ambulate an additional 79' before pt had a 2nd episode of knee bucking requiring min A (had w/c follow during 2nd bout)   ? Ambulation Distance (Feet) 115 Feet   x1  ? Assistive device Rolling walker   R AFO  ? Gait Pattern Decreased weight shift to right;Left flexed knee in stance;Right flexed knee in stance;Narrow base of support;Decreased step length -  right;Decreased step length - left;Decreased stance time - right;Decreased hip/knee flexion - right;Step-through pattern;Poor foot clearance - right   ? Ambulation Surface Level;Indoor   ?  ? Therapeutic Activites   ? Therapeutic Activities Other Therapeutic Activities   ? Other Therapeutic Activities Had discussion w/ pt about POC going forwards (pt has 6 visits left in [place] visits). Original plan was to have pt D/C at end of these visits before pt was hospitalized. Pt still in agreement with plan to D/C and take a break from PT at this time. Pt will plan to continue to ambulate at home with RW, perform HEP as well as get an HEP for aquatics. Discussed will schedule a few more visits and to have pt's spouse come with him to be educated on exercises to perform together. Pt asking about a gym to join, discussed [facility] as an option as there is a pool. Pt could also use some of the UE equipment and if someone is with him, pt can transfer to [employer] and leg press.   ?  ? Neuro Re-ed   ? Neuro Re-ed Details  With BUE support on kaye bench: 3 sets of 5 reps tall kneel mini squats with 3-5 sec isometric hold through half of ROM for quad activation. Performed 2 sets of 5 reps with alternating UE lifts. Cues for tall posture with glute/hip extension once upright. Pt needing a rest break forward onto kaye bench between each set due to fatigue.   ?  ? Exercises  ? Exercises Other Exercises   ?  ? Knee/Hip Exercises: Supine  ? Short Arc [employer] AROM;Strengthening;Both;2 sets;10 reps;Limitations   ? Short Arc [employer] Limitations visual target on how high to kick leg, incr difficulty with LLE, cues for breathing. Performed with 3 second isometric hold   ? ?  ?  ? ?  ? ? ? ? ? ? ? ? ? ? PT Education - 05/24/21 1149   ? ? Education Details See therapeutic activity section.   ? Person(s) Educated Patient   ? Methods Explanation   ? Comprehension Verbalized understanding   ? ?  ?  ? ?  ? ? ? PT Short Term Goals - 03/29/21 1203    ? ?  ? PT SHORT TERM GOAL #1  ? Title Pt will improve 3MWT distance with RW to at least 205' in order to demo improved gait efficiency. ALL STGS DUE 03/29/21   ? Baseline 190' on 02/09/21, pt too fatigued today, will perform at next session.   ? Time 4   ? Period Weeks   ? Status Deferred   ?  Target Date 03/29/21   ?  ? PT SHORT TERM GOAL #2  ? Title Pt will perform sit <> stand from lower mat table consistently with min guard in order to demo improved functional transfers/strength/   ? Baseline min guard on 03/29/21, with assist from therapist to steady   ? Time 4   ? Period Weeks   ? Status Achieved   ?  ? PT SHORT TERM GOAL #4  ? Title In aquatic therapy, pt will be able to descend down pool steps with min guard and ascend out of pool steps with mod A with therapist in order to demo improved functional mobility.   ? Baseline will assess at aquatic session   ? Time 4   ? Period Weeks   ? Status On-going   ? ?  ?  ? ?  ? ? ? ? PT Long Term Goals - 05/03/21 1347   ? ?  ? PT LONG TERM GOAL #1  ? Title Pt and spouse will be independent with final HEP for BLE stretching and strengthening as well as aquatic HEP. ALL LTGS DUE 05/30/21   ? Baseline needed review/update on exercises on 05/03/21, will continue to benefit for review and updates and additions for aquatic HEP   ? Time 12   ? Period Weeks   ? Status Revised   ? Target Date 05/30/21   ?  ? PT LONG TERM GOAL #2  ? Title Pt will improve gait speed with RW and R AFO to at least 1.05 ft/sec in order to demo improved household mobility.   ? Baseline 34.19 seconds = .95 ft/sec on 03/01/21, not assessed on 05/03/21 due to fatigue   ? Time 12   ? Period Weeks   ? Status On-going   ?  ? PT LONG TERM GOAL #4  ? Title Pt will stand at [employer] with min guard with no UE support with min guard for at least 20 seconds in order to demo improved balance for ADLs   ? Baseline 32.84 seconds on 09/12/20; 1 minute 4 seconds on 11/15/20, best time of 22 seconds on 03/01/21   ? Time 12   ? Period  Weeks   ? Status Revised   ?  ? PT LONG TERM GOAL #5  ? Title Pt will ambulate at least 230' over level surfaces with RW and min guard consistently in order to demo improved household mobility and improved endur

## 2021-05-29 ENCOUNTER — Ambulatory Visit: Payer: No Typology Code available for payment source | Admitting: Physical Therapy

## 2021-05-29 DIAGNOSIS — R293 Abnormal posture: Secondary | ICD-10-CM

## 2021-05-29 DIAGNOSIS — R262 Difficulty in walking, not elsewhere classified: Secondary | ICD-10-CM | POA: Diagnosis not present

## 2021-05-29 DIAGNOSIS — R2681 Unsteadiness on feet: Secondary | ICD-10-CM

## 2021-05-29 DIAGNOSIS — M6281 Muscle weakness (generalized): Secondary | ICD-10-CM

## 2021-05-29 NOTE — Therapy (Signed)
[facility] ?[place], [name], [address] ?Phone: [phone]   Fax:  [phone] ? ?Physical Therapy Treatment/Re-Cert ? ?Patient Details  ?Name: Timothy Davenport ?MRN: [id number] ?Date of Birth: 1950-06-04 ?Referring Provider (PT): Piva, Enrico,DO  ? ? ?Encounter Date: 05/29/2021 ? ? PT End of Session - 05/29/21 1430   ? ? Visit Number 69   ? Number of Visits 92   ? Date for PT Re-Evaluation 23/76/28   per re-cert on 05/04/15  ? Authorization Type VA - 15 visits from 03/20/21 - 09/16/21   ? Authorization - Visit Number 10   ? Authorization - Number of Visits 15   ? Progress Note Due on Visit 70   ? PT Start Time 6160   ? PT Stop Time 1524   ? PT Time Calculation (min) 40 min   ? Equipment Utilized During Treatment Gait belt   ? Activity Tolerance Patient tolerated treatment well   ? Behavior During Therapy [facility] for tasks assessed/performed   ? ?  ?  ? ?  ? ? ?Past Medical History:  ?Diagnosis Date  ? Colon polyp   ? Diabetes mellitus without complication ([place])   ? Diabetic neuropathy ([place])   ? HTN (hypertension)   ? Patella fracture   ? Renal calculi   ? ? ?Past Surgical History:  ?Procedure Laterality Date  ? LUMBAR LAMINECTOMY/DECOMPRESSION MICRODISCECTOMY N/A 07/01/2019  ? Procedure: LUMBAR LAMINECTOMY/DECOMPRESSION MICRODISCECTOMY 1 LEVEL, THORACIC SIX-SEVEN;  Surgeon: Consuella Lose, MD;  Location: [facility];  Service: Neurosurgery;  Laterality: N/A;  LUMBAR LAMINECTOMY/DECOMPRESSION MICRODISCECTOMY 1 LEVEL, THORACIC SIX-SEVEN   ? ORIF PATELLA    ? THORACIC DISCECTOMY N/A 07/03/2019  ? Procedure: Evacuation of Thoracic Hematoma;  Surgeon: Consuella Lose, MD;  Location: [place];  Service: Neurosurgery;  Laterality: N/A;  ? ? ?There were no vitals filed for this visit. ? ? Subjective Assessment - 05/29/21 1447   ? ? Subjective Joined [employer] and getting set up with a personal trainer the next time he goes over there.   ? Pertinent History T8  paraplegia, diabetes, HTN   ? How long can you stand comfortably? probably a couple minutes with RW.   ? How long can you walk comfortably? approx. 5-10 minutes with his son and RW.   ? Patient Stated Goals wants to be able to walk without any assistance   ? Currently in Pain? No/denies   ? ?  ?  ? ?  ? ? ? ? ? [facility] PT Assessment - 05/29/21 1455   ? ?  ? Assessment  ? Medical Diagnosis T8 paraplegia   ? Referring Provider (PT) Piva, Enrico,DO    ? Onset Date/Surgical Date 06/30/19   ?  ? Prior Function  ? Level of Independence Independent   ? ?  ?  ? ?  ? ? ? ? ? ? ? ? ? ? ? ? ? ? ? ? [place] Adult PT Treatment/Exercise - 05/29/21 1456   ? ?  ? Transfers  ? Transfers Sit to Stand;Stand to Sit   ? Sit to Stand 4: Min guard;4: Min assist;With upper extremity assist   ? Sit to Stand Details Verbal cues for technique;Verbal cues for precautions/safety;Verbal cues for safe use of DME/AE   ? Sit to Stand Details (indicate cue type and reason) From lower mat table, pt needing min guard for x5 reps prior to gait training to warm legs up with RW. From w/c between gait bouts  pt needing min A from lower surface.   ? Stand to Sit 4: Min guard   ? Stand to Sit Details (indicate cue type and reason) Verbal cues for sequencing;Verbal cues for technique;Verbal cues for safe use of DME/AE   ? Stand to Sit Details Pt needing 2 episodes of min A when lowering back to w/c after seated rest break during gait.   ?  ? Ambulation/Gait  ? Ambulation/Gait Yes   ? Ambulation/Gait Assistance 4: Min guard;4: Min assist   ? Ambulation/Gait Assistance Details Pt wearing R AFO today. Cues for posture, knee extension and wider step width with RLE to avoid LLE getting caught on it. Pt needing min A during last bout of 115' to help clear RLE during swing phase. Noted toe contact during remainder of last 115'. Pt able to walk 3 bouts of 115' w/ seated rest break in between and w/c follow.   ? Ambulation Distance (Feet) 115 Feet   x3  ? Assistive device  Rolling walker   R AFO  ? Gait Pattern Decreased weight shift to right;Left flexed knee in stance;Right flexed knee in stance;Narrow base of support;Decreased step length - right;Decreased step length - left;Decreased stance time - right;Decreased hip/knee flexion - right;Step-through pattern;Poor foot clearance - right   ? Ambulation Surface Level;Indoor   ? Gait velocity 26.82 in 20 ft = .74 ft/sec   ?  ? Exercises  ? Exercises Other Exercises   ? Other Exercises  Reviewed seated ankle DF stretch with use of belt and having foot propped up on w/c ottoman, performed 3 sets of 30 seconds bilat. Pt more tight with R ankle. Gave another printout   ? ?  ?  ? ?  ? ? ? ? ? ? ? ? ? ? PT Education - 05/29/21 1627   ? ? Education Details Added 2 more aquatic appts to pt's schedule and provided new handout. Making sure pt's spouse comes to aquatic sessions to learn what pt should be doing for HEP. Print out of seated ankle DF stretch.   ? Person(s) Educated Patient   ? Methods Explanation;Demonstration;Handout   ? Comprehension Verbalized understanding;Returned demonstration   ? ?  ?  ? ?  ? ? ? PT Short Term Goals - 05/29/21 1629   ? ?  ? PT SHORT TERM GOAL #1  ? Title ALL STGS = LTGS   ? ?  ?  ? ?  ? ? ? ? PT Long Term Goals - 05/29/21 1629   ? ?  ? PT LONG TERM GOAL #1  ? Title Pt and spouse will be independent with final HEP for BLE stretching and strengthening as well as aquatic HEP. ALL LTGS DUE 05/30/21   ? Baseline began to review land exercises on 05/29/21, will benefit from review before D/C   ? Time 12   ? Period Weeks   ? Status On-going   ? Target Date 05/30/21   ?  ? PT LONG TERM GOAL #2  ? Title Pt will improve gait speed with RW and R AFO to at least 1.05 ft/sec in order to demo improved household mobility.   ? Baseline 34.19 seconds = .95 ft/sec on 03/01/21, .74 ft/sec on 05/29/21 with RW   ? Time 12   ? Period Weeks   ? Status Not Met   ?  ? PT LONG TERM GOAL #4  ? Title Pt will stand at [facility] with min guard with  no UE support  with min guard for at least 20 seconds in order to demo improved balance for ADLs   ? Baseline 32.84 seconds on 09/12/20; 1 minute 4 seconds on 11/15/20, best time of 22 seconds on 03/01/21   ? Time 12   ? Period Weeks   ? Status On-going   ?  ? PT LONG TERM GOAL #5  ? Title Pt will ambulate at least 230' over level surfaces with RW and min guard consistently in order to demo improved household mobility and improved endurance   ? Baseline 345' in 3 separate bout of 115' (pt needing a rest break between each 115' today) min guard for first 2 laps.   ? Time 12   ? Period Weeks   ? Status Not Met   ? ?  ?  ? ?  ? ?Updated/ongoing LTGs for re-cert:  ? ? ? ? PT Long Term Goals - 05/29/21 1637   ? ?  ? PT LONG TERM GOAL #1  ? Title Pt and spouse will be independent with final HEP for BLE stretching and strengthening as well as aquatic HEP. ALL LTGS DUE 06/26/21   ? Baseline began to review land exercises on 05/29/21, will benefit from review before D/C   ? Time 4   ? Period Weeks   ? Status On-going   ? Target Date 06/26/21   ?  ? PT LONG TERM GOAL #4  ? Title Pt will stand at [facility] with min guard with no UE support with min guard for at least 20 seconds in order to demo improved balance for ADLs   ? Baseline 32.84 seconds on 09/12/20; 1 minute 4 seconds on 11/15/20, best time of 22 seconds on 03/01/21   ? Time 4   ? Period Weeks   ? Status On-going   ?  ? PT LONG TERM GOAL #5  ? Title Pt will ambulate at least 230' over level surfaces with RW and min guard consistently in order to demo improved household mobility and improved endurance   ? Baseline 345' in 3 separate bout of 115' (pt needing a rest break between each 115' today) min guard for first 2 laps.   ? Time 4   ? Period Weeks   ? Status On-going   ? Target Date 06/26/21   ? ?  ?  ? ?  ? ? ? ? ? Plan - 05/29/21 1631   ? ? Clinical Impression Statement Checked pt's LTGs for re-cert today. Pt did not meet LTG #2 and #5. Pt's gait speed today with RW was .74 ft/sec  (previously was .95 ft/sec), indicating that pt is a limited household ambulator. Pt able to walk 3 bouts of 115' each today with min guard during first 2 and min A during last bout with pt needing w/

## 2021-05-29 NOTE — Patient Instructions (Signed)
Access Code: [email or web address] ?URL: [email or web address] ?Date: 05/29/2021 ?Prepared by: Janann August ? ?Program Notes ?lying on your stomach for hip stretch, sitting long on your bed with feet out straight for hamstring stretch, gently bring them out in a V and reach forwards for inner thigh stretch  ? ?Exercises ?- Modified Thomas Stretch  - 1 x daily - 5 x weekly - 1 sets - 10 reps ?- Seated Long Arc Quad  - 1 x daily - 5 x weekly - 1 sets - 10 reps ?- Seated Single Leg Hip Abduction with Resistance  - 1 x daily - 5 x weekly - 1 sets - 10 reps ?- Seated Hamstring Curl with Anchored Resistance  - 1 x daily - 5 x weekly - 1 sets - 10 reps ?- Standing March with Counter Support  - 1 x daily - 5 x weekly - 1 sets - 10 reps ?- Wide Stance with Counter Support  - 1 x daily - 5 x weekly - 1 sets - 10 reps ?- Seated Hamstring Stretch  - 1 x daily - 5 x weekly - 3 sets - 30 hold ?- Sit to Stand with Counter Support  - 1 x daily - 5 x weekly - 2 sets - 5 reps ?- Supine Quadricep Sets  - 1 x daily - 7 x weekly - 2 sets - 10 reps ?- Seated Gastroc Stretch with Strap  - 2 x daily - 7 x weekly - 3 sets - 30 hold ?

## 2021-06-05 ENCOUNTER — Ambulatory Visit: Payer: No Typology Code available for payment source | Admitting: Physical Therapy

## 2021-06-07 ENCOUNTER — Encounter: Payer: Self-pay | Admitting: Physical Therapy

## 2021-06-07 ENCOUNTER — Encounter: Payer: Self-pay | Admitting: Physical Medicine and Rehabilitation

## 2021-06-07 ENCOUNTER — Ambulatory Visit: Payer: No Typology Code available for payment source | Admitting: Physical Therapy

## 2021-06-07 ENCOUNTER — Encounter
Payer: No Typology Code available for payment source | Attending: Physical Medicine and Rehabilitation | Admitting: Physical Medicine and Rehabilitation

## 2021-06-07 VITALS — BP 124/75 | HR 68 | Ht 71.0 in

## 2021-06-07 DIAGNOSIS — G8220 Paraplegia, unspecified: Secondary | ICD-10-CM | POA: Diagnosis present

## 2021-06-07 DIAGNOSIS — R262 Difficulty in walking, not elsewhere classified: Secondary | ICD-10-CM

## 2021-06-07 DIAGNOSIS — Z993 Dependence on wheelchair: Secondary | ICD-10-CM | POA: Diagnosis not present

## 2021-06-07 DIAGNOSIS — R2681 Unsteadiness on feet: Secondary | ICD-10-CM

## 2021-06-07 DIAGNOSIS — R252 Cramp and spasm: Secondary | ICD-10-CM | POA: Insufficient documentation

## 2021-06-07 DIAGNOSIS — M6281 Muscle weakness (generalized): Secondary | ICD-10-CM

## 2021-06-07 NOTE — Patient Instructions (Signed)
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  ?Also has hepatic lesions- on liver- dx'd several years ago.  ? ? 1. 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 Will need to check LFTs at next visit-  ? ?2. Getting PT at [facility] and getting personal trainer x 90 days- agree with continuing since making such good gains.  ? ?3. Intermittent numbness- likely compression of ulnar nerve at wrist or elbow- can get tennis elbow splint  ?Epi Sport Epicondylitis Clasp is the one I like and I use myself. That should help the numbness- showed pt how to wear- right below the elbow to wear it.  ? ? ?4. Wants to come walking next time he sees me! Would be so excited.  ? ?5. Will contact him about SCI support group I'm starting.  ? ? ?6. F/U in 3 months- double appt ?

## 2021-06-07 NOTE — Progress Notes (Signed)
? ?Subjective:  ? ? Patient ID: Timothy Davenport, male    DOB: [birth year], 71 y.o.   MRN: [id number] ? 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 ?  ?Was dx'd with lower GI bleed. Blood in stool for 2 days-  ?Due to diverticulosis.  ? ?Since in hospital, taken a "pill" for hepatic lesions per pt- not clear what this means, but is being looked at.  ? ? ?Spasticity- Still having spasms-  still tight and has accompanying spasms.  ? ? ? ? ?BM's- coming pretty regular- going at 8-9am in morning- and another around 5pm.  ?Feels like needs to have BM- can have BM on toilet without suppository. No constipation.  ? ?Urination- can also have urge to pee- and urinates- still needs to empty bladder by cathing, - but voids ~ 100cc and then caths for 100-200cc.  ? ? ?Walking 300 ft or more with RW.  ?Getting better with walking/recovery.  ? ?Not having that much pain in R>L legs due to spasticity.  ? ? ?Also having R 5th digit numbness when sleeps and wakes up and feels "dead".  ? ?Pain Inventory ?Average Pain 1 ?Pain Right Now 1 ?My pain is tingling and spasms on the right side ? ?LOCATION OF PAIN  leg  ? ?BOWEL ?Number of stools per week: 8 ?Enema or suppository use Yes  ?History of colostomy No  ?Incontinent No  ? ?BLADDER ?Normal ?Bladder incontinence No  ?Frequent urination Yes  ?Leakage with coughing Yes  ?Difficulty starting stream No  ?Incomplete bladder emptying No  ? ? ?Mobility ?walk with assistance ?how many minutes can you walk? 6 ?ability to climb steps?  yes ?do you drive?  no ?use a wheelchair ? ?Function ?retired ?I need assistance with the following:  feeding, dressing, household duties, and shopping ? ?Neuro/Psych ?bladder control problems ?depression ?anxiety ? ?Prior Studies ?Any changes since last visit?  no ? ?Physicians involved in your care ?Any changes since last visit?   no ? ? ?Family History  ?Problem Relation Age of Onset  ? Hypertension Mother   ? Hypertension Father   ? ?Social History  ? ?Socioeconomic History  ? Marital status: Married  ?  Spouse name: Not on file  ? Number of children: Not on file  ? Years of education: Not on file  ? Highest education level: Not on file  ?Occupational History  ? Not on file  ?Tobacco Use  ? Smoking status: Never  ? Smokeless tobacco: Never  ?Vaping Use  ? Vaping Use: Never used  ?Substance and Sexual Activity  ? Alcohol use: Yes  ?  Comment: 2-3 drinks week  ? Drug use: Yes  ?  Types: Marijuana  ?  Comment: occasionally  ? Sexual activity: Not Currently  ?Other Topics Concern  ? Not on file  ?Social History Narrative  ? Not on file  ? ?Social Determinants of Health  ? ?Financial Resource Strain: Not on file  ?Food Insecurity: Not on file  ?Transportation Needs: Not on file  ?Physical Activity: Not on file  ?Stress: Not on file  ?Social Connections: Not on file  ? ?Past Surgical History:  ?Procedure Laterality Date  ? LUMBAR LAMINECTOMY/DECOMPRESSION MICRODISCECTOMY N/A 07/01/2019  ? Procedure: LUMBAR LAMINECTOMY/DECOMPRESSION MICRODISCECTOMY 1 LEVEL, THORACIC SIX-SEVEN;  Surgeon: Consuella Lose, MD;  Location: [place];  Service:  Neurosurgery;  Laterality: N/A;  LUMBAR LAMINECTOMY/DECOMPRESSION MICRODISCECTOMY 1 LEVEL, THORACIC SIX-SEVEN   ? ORIF PATELLA    ? THORACIC DISCECTOMY N/A 07/03/2019  ? Procedure: Evacuation of Thoracic Hematoma;  Surgeon: Consuella Lose, MD;  Location: [place];  Service: Neurosurgery;  Laterality: N/A;  ? ?Past Medical History:  ?Diagnosis Date  ? Colon polyp   ? Diabetes mellitus without complication ([place])   ? Diabetic neuropathy ([place])   ? HTN (hypertension)   ? Patella fracture   ? Renal calculi   ? ?BP 124/75   Pulse 68   Ht '5\' 11"'$  (1.803 m)   SpO2 98%   BMI 21.34 kg/m?  ? ?Opioid Risk Score:   ?Fall Risk Score:  `1 ? ?Depression screen PHQ 2/9 ? ? ?  06/07/2021  ? 11:12 AM 03/08/2021  ? 11:04 AM 11/30/2020   ? 11:07 AM 03/02/2020  ? 11:22 AM 11/27/2019  ?  3:29 PM 08/03/2019  ? 10:14 AM  ?Depression screen PHQ 2/9  ?Decreased Interest 0 0 0 '1 1 3  '$ ?Down, Depressed, Hopeless 0 0 0 1 1 0  ?PHQ - 2 Score 0 0 0 '2 2 3  '$ ?Altered sleeping      0  ?Tired, decreased energy      0  ?Change in appetite      1  ?Feeling bad or failure about yourself       0  ?Trouble concentrating      0  ?Moving slowly or fidgety/restless      0  ?Suicidal thoughts      0  ?PHQ-9 Score      4  ?Difficult doing work/chores      Not difficult at all  ?  ? ?Review of Systems  ?Constitutional:  Positive for appetite change.  ?HENT: Negative.    ?Eyes: Negative.   ?Respiratory: Negative.    ?Cardiovascular: Negative.   ?Gastrointestinal: Negative.   ?Endocrine: Negative.   ?Genitourinary:  Positive for frequency.  ?Musculoskeletal:  Positive for gait problem.  ?Skin: Negative.   ?Allergic/Immunologic: Negative.   ?Hematological: Negative.   ?Psychiatric/Behavioral:  Positive for dysphoric mood. The patient is nervous/anxious.   ? ?   ?Objective:  ? Physical Exam ? ?Awake, alert, appropriate, in manual w/c; alone today- NAD ?Neuro: ?MAS of 3 in RLE- mainly R knee and ankle- MAS of 2 in R hip ?MAS of 1+ to 2 in LLE ?4-5 beats clonus in LLE ?Wearing AFO on RLE- few beats clonus on RLE ?MS: ?Can fully extend R knee now was -20 degrees last time and can now also fully extend L knee- was -5 degrees last time.  ? ?Sensation to light touch on 4th/5th digit on R hand is normal at rest ?Tinels' negative at wrist and elbow on R ?   ?Assessment & Plan:  ? 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  ?Also has hepatic lesions- on liver- dx'd several years ago.  ? ? 1. Spasticity- is MUCH better- MAS now 2-3 in RLE vs severe MAS of 4- discussed  options which Dantrolene is at max dose- so cannot increase- but I think Botox is not the way to go right now- because using his tone to walk- don't want to  take it all away- will wait until a little stronger- and if so, then revisit the topic.  ? ?2. Getting PT at [facility] and getting personal trainer x 90 days- agree with continuing since making such good gains.  ? ?3. Intermittent numbness- likely compression of ulnar nerve at wrist or elbow- can get tennis elbow splint  ?Epi Sport Epicondylitis Clasp is the one I like and I use myself. That should help the numbness- showed pt how to wear- right below the elbow to wear it.  ? ? ?4. Wants to come walking next time he sees me! Would be so excited.  ? ?5. Will contact him about SCI support group I'm starting.  ? ? ?6. F/U in 3 months- double appt ? ?I spent a total of 31   minutes on total care today- >50% coordination of care- due to discussion of support group, liver lesions, ulnar neuropathy and spasticity options. ? ?

## 2021-06-07 NOTE — Therapy (Signed)
[facility] ?[place], [name], [address] ?Phone: [phone]   Fax:  [phone] ? ?Physical Therapy Treatment ? ?Patient Details  ?Name: Timothy Davenport ?MRN: [id number] ?Date of Birth: Oct 30, 1950 ?Referring Provider (PT): Piva, Enrico,DO  ? ? ?Encounter Date: 06/07/2021 ? ? PT End of Session - 06/07/21 1449   ? ? Visit Number 37   ? Number of Visits 92   ? Date for PT Re-Evaluation 38/32/91   per re-cert on 11/05/58  ? Authorization Type VA - 15 visits from 03/20/21 - 09/16/21   ? Authorization - Visit Number 11   ? Authorization - Number of Visits 15   ? Progress Note Due on Visit 70   ? PT Start Time 1448   ? PT Stop Time 1529   ? PT Time Calculation (min) 41 min   ? Equipment Utilized During Treatment Gait belt   ? Activity Tolerance Patient tolerated treatment well   ? Behavior During Therapy [facility] for tasks assessed/performed   ? ?  ?  ? ?  ? ? ?Past Medical History:  ?Diagnosis Date  ? Colon polyp   ? Diabetes mellitus without complication ([place])   ? Diabetic neuropathy ([place])   ? HTN (hypertension)   ? Patella fracture   ? Renal calculi   ? ? ?Past Surgical History:  ?Procedure Laterality Date  ? LUMBAR LAMINECTOMY/DECOMPRESSION MICRODISCECTOMY N/A 07/01/2019  ? Procedure: LUMBAR LAMINECTOMY/DECOMPRESSION MICRODISCECTOMY 1 LEVEL, THORACIC SIX-SEVEN;  Surgeon: Consuella Lose, MD;  Location: [place];  Service: Neurosurgery;  Laterality: N/A;  LUMBAR LAMINECTOMY/DECOMPRESSION MICRODISCECTOMY 1 LEVEL, THORACIC SIX-SEVEN   ? ORIF PATELLA    ? THORACIC DISCECTOMY N/A 07/03/2019  ? Procedure: Evacuation of Thoracic Hematoma;  Surgeon: Consuella Lose, MD;  Location: [place];  Service: Neurosurgery;  Laterality: N/A;  ? ? ?There were no vitals filed for this visit. ? ? Subjective Assessment - 06/07/21 1454   ? ? Subjective Had a great checkup with Dr. Dagoberto Ligas. Spasticity is getting better.   ? Pertinent History T8 paraplegia, diabetes, HTN   ? How  long can you stand comfortably? probably a couple minutes with RW.   ? How long can you walk comfortably? approx. 5-10 minutes with his son and RW.   ? Patient Stated Goals wants to be able to walk without any assistance   ? Currently in Pain? No/denies   ? ?  ?  ? ?  ? ? ? ? ? ? ? ? ? ? ? ? ? ? ? ? ? ? ? ? [facility] Adult PT Treatment/Exercise - 06/07/21 1455   ? ?  ? Transfers  ? Transfers Sit to Stand;Stand to Sit   ? Sit to Stand 4: Min guard;With upper extremity assist   ? Sit to Stand Details Verbal cues for technique;Verbal cues for precautions/safety;Verbal cues for safe use of DME/AE   ? Sit to Stand Details (indicate cue type and reason) From lower mat table prior to gait x5 reps, pt needing min guard from lower w/c, but just took incr time.   ? Stand to Sit 4: Min guard   ? Stand to Sit Details (indicate cue type and reason) Verbal cues for sequencing;Verbal cues for technique;Verbal cues for safe use of DME/AE   ? Stand to Sit Details Needing min A for controlled descent back into w/c after gait. Min guard to mat table.   ? Lateral/Scoot Transfers 6: Modified independent (Device/Increase time)   ? Lateral/Scoot Transfer  Details (indicate cue type and reason) From w/c <> mat table and SciFit <> w/c   ?  ? Ambulation/Gait  ? Ambulation/Gait Yes   ? Ambulation/Gait Assistance 4: Min guard   ? Ambulation/Gait Assistance Details Pt wearing R AFO today. Cues for posture, knee extension and wider step width with RLE to avoid LLE getting caught on it. Pt with w/c follow during 2 laps, but it was not needed. Incr toe contact with RLE with incr gait distances   ? Ambulation Distance (Feet) 230 Feet   x1, 115 x 1  ? Assistive device Rolling walker   R AFO  ? Gait Pattern Decreased weight shift to right;Left flexed knee in stance;Right flexed knee in stance;Narrow base of support;Decreased step length - right;Decreased step length - left;Decreased stance time - right;Decreased hip/knee flexion - right;Step-through  pattern;Poor foot clearance - right   ? Ambulation Surface Level;Indoor   ?  ? Exercises  ? Exercises Other Exercises   ? Other Exercises  Standing at RW without R AFO for closed chain ankle DF stretch (esp with RLE)  x10 reps, cues for technique and discussed that pt can perform it at home as well without wearing brace as long as he has a bed/chair behind him.   ?  ? Knee/Hip Exercises: Aerobic  ? Stepper Scifit with BLE only at level 4.0 for 8 minutes with goal >/= 85 steps per minute for strengthening, ROM, endurance. Cues for full knee exension. Pt able to transfer laterally to seat from w/c w/ supervision.   ? ?  ?  ? ?  ? ? ? ? ? ? ? ? ? ? PT Education - 06/07/21 1634   ? ? Education Details Scheduling additional aquatic appts.   ? Person(s) Educated Patient   ? Methods Explanation;Handout   ? Comprehension Verbalized understanding   ? ?  ?  ? ?  ? ? ? PT Short Term Goals - 05/29/21 1629   ? ?  ? PT SHORT TERM GOAL #1  ? Title ALL STGS = LTGS   ? ?  ?  ? ?  ? ? ? ? PT Long Term Goals - 05/29/21 1637   ? ?  ? PT LONG TERM GOAL #1  ? Title Pt and spouse will be independent with final HEP for BLE stretching and strengthening as well as aquatic HEP. ALL LTGS DUE 06/26/21   ? Baseline began to review land exercises on 05/29/21, will benefit from review before D/C   ? Time 4   ? Period Weeks   ? Status On-going   ? Target Date 06/26/21   ?  ? PT LONG TERM GOAL #4  ? Title Pt will stand at [facility] with min guard with no UE support with min guard for at least 20 seconds in order to demo improved balance for ADLs   ? Baseline 32.84 seconds on 09/12/20; 1 minute 4 seconds on 11/15/20, best time of 22 seconds on 03/01/21   ? Time 4   ? Period Weeks   ? Status On-going   ?  ? PT LONG TERM GOAL #5  ? Title Pt will ambulate at least 230' over level surfaces with RW and min guard consistently in order to demo improved household mobility and improved endurance   ? Baseline 345' in 3 separate bout of 115' (pt needing a rest break between  each 115' today) min guard for first 2 laps.   ? Time 4   ?  Period Weeks   ? Status On-going   ? Target Date 06/26/21   ? ?  ?  ? ?  ? ? ? ? ? ? ? ? Plan - 06/07/21 1635   ? ? Clinical Impression Statement Pt able to ambulate 230' with RW w/ min guard in one consecutive lap today before needing a rest break. Able to ambulate an additional 115' but pt demonstrating more forwards flexed posture and incr toe contact with RLE. Remainder of session focused on BLE strengthening and endurance w/ SciFit. Pt tolerated session well, will continue to progress towards LTGs.   ? Personal Factors and Comorbidities Comorbidity 3+;Past/Current Experience   ? Comorbidities T8 paraplegia, diabetes, HTN   ? Examination-Activity Limitations Locomotion Level;Transfers;Stand   ? Examination-Participation Restrictions Community Activity;Valla Leaver Work   ? Stability/Clinical Decision Making Evolving/Moderate complexity   ? Rehab Potential Good   ? PT Frequency 2x / week   ? PT Duration 4 weeks   ? PT Treatment/Interventions Aquatic Therapy;Manual techniques;Therapeutic exercise;Gait training;Neuromuscular re-education;Orthotic Fit/Training   ? PT Next Visit Plan finalize HEP for land and aquatic therapy and give handout.  Plan to D/C at end of current visits. continue to work on gait distance, [place] for endurance/ROM/strengthening. BLE strengthening.   ? PT Home Exercise Plan Access Code: [email or web address]   ? Consulted and Agree with Plan of Care Patient   ? ?  ?  ? ?  ? ? ?Patient will benefit from skilled therapeutic intervention in order to improve the following deficits and impairments:  Decreased balance, Decreased coordination, Decreased range of motion, Decreased strength, Decreased mobility, Postural dysfunction, Impaired tone, Decreased activity tolerance, Abnormal gait, Decreased endurance, Difficulty walking, Impaired sensation ? ?Visit Diagnosis: ?Difficulty in walking, not elsewhere classified ? ?Unsteadiness on feet ? ?Muscle weakness  (generalized) ? ? ? ? ?Problem List ?Patient Active Problem List  ? Diagnosis Date Noted  ? Malnutrition of moderate degree 04/25/2021  ? Liver lesion   ? Urinary tract infection with hematuria   ? Lower GI bl

## 2021-06-12 ENCOUNTER — Ambulatory Visit: Payer: No Typology Code available for payment source | Admitting: Physical Therapy

## 2021-06-13 NOTE — Addendum Note (Signed)
Addended by: Kerrie Pleasure on: 06/13/2021 03:02 PM ? ? Modules accepted: Orders ? ?

## 2021-06-14 ENCOUNTER — Ambulatory Visit: Payer: No Typology Code available for payment source | Admitting: Physical Therapy

## 2021-06-14 DIAGNOSIS — M6281 Muscle weakness (generalized): Secondary | ICD-10-CM

## 2021-06-14 DIAGNOSIS — R293 Abnormal posture: Secondary | ICD-10-CM

## 2021-06-14 DIAGNOSIS — R262 Difficulty in walking, not elsewhere classified: Secondary | ICD-10-CM | POA: Diagnosis not present

## 2021-06-14 DIAGNOSIS — R2681 Unsteadiness on feet: Secondary | ICD-10-CM

## 2021-06-14 NOTE — Therapy (Signed)
[facility] ?[place], [name], [address] ?Phone: [phone]   Fax:  [phone] ? ?Physical Therapy Treatment ? ?Patient Details  ?Name: Timothy Davenport ?MRN: [id number] ?Date of Birth: 08/28/1950 ?Referring Provider (PT): Piva, Enrico,DO  ? ? ?Encounter Date: 06/14/2021 ? ? PT End of Session - 06/14/21 1021   ? ? Visit Number 15   ? Number of Visits 92   ? Date for PT Re-Evaluation 34/19/62   per re-cert on 2/29/79  ? Authorization Type VA - 15 visits from 03/20/21 - 09/16/21   ? Authorization - Visit Number 12   ? Authorization - Number of Visits 15   ? Progress Note Due on Visit 70   ? PT Start Time 1019   ? PT Stop Time 1100   ? PT Time Calculation (min) 41 min   ? Equipment Utilized During Treatment Gait belt   ? Activity Tolerance Patient tolerated treatment well   ? Behavior During Therapy [facility] for tasks assessed/performed   ? ?  ?  ? ?  ? ? ?Past Medical History:  ?Diagnosis Date  ? Colon polyp   ? Diabetes mellitus without complication ([place])   ? Diabetic neuropathy ([place])   ? HTN (hypertension)   ? Patella fracture   ? Renal calculi   ? ? ?Past Surgical History:  ?Procedure Laterality Date  ? LUMBAR LAMINECTOMY/DECOMPRESSION MICRODISCECTOMY N/A 07/01/2019  ? Procedure: LUMBAR LAMINECTOMY/DECOMPRESSION MICRODISCECTOMY 1 LEVEL, THORACIC SIX-SEVEN;  Surgeon: Consuella Lose, MD;  Location: [place];  Service: Neurosurgery;  Laterality: N/A;  LUMBAR LAMINECTOMY/DECOMPRESSION MICRODISCECTOMY 1 LEVEL, THORACIC SIX-SEVEN   ? ORIF PATELLA    ? THORACIC DISCECTOMY N/A 07/03/2019  ? Procedure: Evacuation of Thoracic Hematoma;  Surgeon: Consuella Lose, MD;  Location: [facility];  Service: Neurosurgery;  Laterality: N/A;  ? ? ?There were no vitals filed for this visit. ? ? Subjective Assessment - 06/14/21 1022   ? ? Subjective Joined [facility]. Has personal trainer session on May 6th.   ? Pertinent History T8 paraplegia, diabetes, HTN   ? How long can  you stand comfortably? probably a couple minutes with RW.   ? How long can you walk comfortably? approx. 5-10 minutes with his son and RW.   ? Patient Stated Goals wants to be able to walk without any assistance   ? Currently in Pain? No/denies   ? ?  ?  ? ?  ? ? ? ? ? ? ? ? ? ? ? ? ? ? ? ? ? ? ? ? [facility] Adult PT Treatment/Exercise - 06/14/21 1037   ? ?  ? Transfers  ? Transfers Sit to Stand;Stand to Sit   ? Sit to Stand 4: Min guard;With upper extremity assist   ? Sit to Stand Details Verbal cues for technique;Verbal cues for precautions/safety;Verbal cues for safe use of DME/AE   ? Sit to Stand Details (indicate cue type and reason) From lower mat table prior, pt needing min guard from lower w/c, but just took incr time.   ? Stand to Sit 4: Min guard   ? Stand to Sit Details (indicate cue type and reason) Verbal cues for sequencing;Verbal cues for technique;Verbal cues for safe use of DME/AE   ? Stand to Sit Details Needing min A for controlled descent back into w/c after gait. Min guard to mat table.   ?  ? Ambulation/Gait  ? Ambulation/Gait Yes   ? Ambulation/Gait Assistance 4: Min guard   ?  Ambulation/Gait Assistance Details Pt wearing R AFO today. Cues for posture, knee extension and wider step width with RLE to avoid LLE getting caught on it. Pt with w/c follow, needing to sit and rest after each 115'. Pt fatigued after 2 laps.   ? Ambulation Distance (Feet) 115 Feet   x2  ? Assistive device Rolling walker   R AFO  ? Gait Pattern Decreased weight shift to right;Left flexed knee in stance;Right flexed knee in stance;Narrow base of support;Decreased step length - right;Decreased step length - left;Decreased stance time - right;Decreased hip/knee flexion - right;Step-through pattern;Poor foot clearance - right   ? Ambulation Surface Level;Indoor   ?  ? Exercises  ? Exercises Other Exercises   ? Other Exercises  Standing at RW without R AFO for closed chain ankle DF stretch (esp with RLE)  x10 reps, cues for  technique and shifting weight more posteriorly.   ?  ? Knee/Hip Exercises: Stretches  ? Passive Hamstring Stretch Both;3 reps;30 seconds   ? Passive Hamstring Stretch Limitations with pt supine on mat table, performed with additional DF stretch as well.   ? Gastroc Stretch Right;3 reps;30 seconds   ? Gastroc Stretch Limitations with pt in supine   ? Other Knee/Hip Stretches single knee to chest stretch on LLE 2 x 30 seconds, with cues for pt to press RLE down into mat for additional knee and hip extension stretch   ?  ? Knee/Hip Exercises: Machines for Strengthening  ? Total Gym Leg Press bil LE's 60# 1 set of 10 reps,  Cues for eccentric control. Pt able to transfer from w/c with supervision, needs assist to get RLE up on foot plate.   ?  ? Knee/Hip Exercises: Supine  ? Bridges AROM;Strengthening;Both;10 reps;2 sets   ? Bridges Limitations with legs more extended for more hamstring activation x10 reps, x10 reps with knees bent for more glute activation - cued for full ROM.   ? ?  ?  ? ?  ? ? ? ? ? ? ? ? ? ? PT Education - 06/14/21 1101   ? ? Education Details Scheduling one more land appt, importance of hamstring and ankle DF stretching, performing bridging more than just 1x a week.   ? Person(s) Educated Patient   ? Methods Explanation   ? Comprehension Verbalized understanding   ? ?  ?  ? ?  ? ? ? PT Short Term Goals - 05/29/21 1629   ? ?  ? PT SHORT TERM GOAL #1  ? Title ALL STGS = LTGS   ? ?  ?  ? ?  ? ? ? ? PT Long Term Goals - 05/29/21 1637   ? ?  ? PT LONG TERM GOAL #1  ? Title Pt and spouse will be independent with final HEP for BLE stretching and strengthening as well as aquatic HEP. ALL LTGS DUE 06/26/21   ? Baseline began to review land exercises on 05/29/21, will benefit from review before D/C   ? Time 4   ? Period Weeks   ? Status On-going   ? Target Date 06/26/21   ?  ? PT LONG TERM GOAL #4  ? Title Pt will stand at [facility] with min guard with no UE support with min guard for at least 20 seconds in order to  demo improved balance for ADLs   ? Baseline 32.84 seconds on 09/12/20; 1 minute 4 seconds on 11/15/20, best time of 22 seconds on 03/01/21   ?  Time 4   ? Period Weeks   ? Status On-going   ?  ? PT LONG TERM GOAL #5  ? Title Pt will ambulate at least 230' over level surfaces with RW and min guard consistently in order to demo improved household mobility and improved endurance   ? Baseline 345' in 3 separate bout of 115' (pt needing a rest break between each 115' today) min guard for first 2 laps.   ? Time 4   ? Period Weeks   ? Status On-going   ? Target Date 06/26/21   ? ?  ?  ? ?  ? ? ? ? ? ? ? ? Plan - 06/14/21 1154   ? ? Clinical Impression Statement Today's skilled session focused on RLE>LLE hamstring and ankle DF stretching, strengthening, and gait with RW. Educated on importance on making sure that pt continues with his stretches after pt is discharged from PT. Pt able to ambulate 115' x 2 today, but pt more fatigued afterwards and needing a rest break in between each bout. Pt had a couple episodes of getting LLE caught behind RLE due to more narrow BOS, but pt able to self correct. Will continue to progress towards LTGs.   ? Personal Factors and Comorbidities Comorbidity 3+;Past/Current Experience   ? Comorbidities T8 paraplegia, diabetes, HTN   ? Examination-Activity Limitations Locomotion Level;Transfers;Stand   ? Examination-Participation Restrictions Community Activity;Valla Leaver Work   ? Stability/Clinical Decision Making Evolving/Moderate complexity   ? Rehab Potential Good   ? PT Frequency 2x / week   ? PT Duration 4 weeks   ? PT Treatment/Interventions Aquatic Therapy;Manual techniques;Therapeutic exercise;Gait training;Neuromuscular re-education;Orthotic Fit/Training   ? PT Next Visit Plan finalize HEP for land and aquatic therapy and give handout.  Plan to D/C at end of current visits. continue to work on gait distance, [place] for endurance/ROM/strengthening. BLE strengthening.   ? PT Home Exercise Plan  Access Code: [email or web address]   ? Consulted and Agree with Plan of Care Patient   ? ?  ?  ? ?  ? ? ?Patient will benefit from skilled therapeutic intervention in order to improve the following deficits and impairments:

## 2021-06-19 ENCOUNTER — Ambulatory Visit: Payer: No Typology Code available for payment source | Attending: Family Medicine | Admitting: Physical Therapy

## 2021-06-19 DIAGNOSIS — R293 Abnormal posture: Secondary | ICD-10-CM | POA: Diagnosis present

## 2021-06-19 DIAGNOSIS — R262 Difficulty in walking, not elsewhere classified: Secondary | ICD-10-CM | POA: Diagnosis not present

## 2021-06-19 DIAGNOSIS — M6281 Muscle weakness (generalized): Secondary | ICD-10-CM | POA: Diagnosis present

## 2021-06-19 DIAGNOSIS — R2681 Unsteadiness on feet: Secondary | ICD-10-CM | POA: Insufficient documentation

## 2021-06-20 ENCOUNTER — Encounter: Payer: Self-pay | Admitting: Physical Therapy

## 2021-06-20 NOTE — Therapy (Signed)
[facility] ?[place] ?[place], [name], [address] ?Phone: [phone]   Fax:  [phone] ? ?Physical Therapy Treatment ? ?Patient Details  ?Name: Timothy Davenport ?MRN: [id number] ?Date of Birth: 1950/08/16 ?Referring Provider (PT): Piva, Enrico,DO  ? ? ?Encounter Date: 06/19/2021 ? ? PT End of Session - 06/20/21 1053   ? ? Visit Number 90   ? Number of Visits 92   ? Date for PT Re-Evaluation 11/94/17   per re-cert on 05/27/12  ? Authorization Type VA - 15 visits from 03/20/21 - 09/16/21   ? Authorization - Visit Number 13   ? Authorization - Number of Visits 15   ? Progress Note Due on Visit 70   ? PT Start Time 1355   ? PT Stop Time 4818   ? PT Time Calculation (min) 50 min   ? Equipment Utilized During Treatment Gait belt   large bar bell, aquatic weights, aquatic step, single bar bells  ? Activity Tolerance Patient tolerated treatment well   ? Behavior During Therapy [facility] for tasks assessed/performed   ? ?  ?  ? ?  ? ? ?Past Medical History:  ?Diagnosis Date  ? Colon polyp   ? Diabetes mellitus without complication ([place])   ? Diabetic neuropathy ([place])   ? HTN (hypertension)   ? Patella fracture   ? Renal calculi   ? ? ?Past Surgical History:  ?Procedure Laterality Date  ? LUMBAR LAMINECTOMY/DECOMPRESSION MICRODISCECTOMY N/A 07/01/2019  ? Procedure: LUMBAR LAMINECTOMY/DECOMPRESSION MICRODISCECTOMY 1 LEVEL, THORACIC SIX-SEVEN;  Surgeon: Consuella Lose, MD;  Location: [place];  Service: Neurosurgery;  Laterality: N/A;  LUMBAR LAMINECTOMY/DECOMPRESSION MICRODISCECTOMY 1 LEVEL, THORACIC SIX-SEVEN   ? ORIF PATELLA    ? THORACIC DISCECTOMY N/A 07/03/2019  ? Procedure: Evacuation of Thoracic Hematoma;  Surgeon: Consuella Lose, MD;  Location: [place];  Service: Neurosurgery;  Laterality: N/A;  ? ? ?There were no vitals filed for this visit. ? ? Subjective Assessment - 06/19/21 1101   ? ? Subjective No new complaints. No falls.or pain to report.   ? Pertinent  History T8 paraplegia, diabetes, HTN   ? How long can you stand comfortably? probably a couple minutes with RW.   ? How long can you walk comfortably? approx. 5-10 minutes with his son and RW.   ? Patient Stated Goals wants to be able to walk without any assistance   ? Currently in Pain? No/denies   ? Pain Score 0-No pain   ? ?  ?  ? ?  ? ? ?Aquatic therapy at [facility] - pool temp 90 degrees ? ? ? Patient seen for aquatic therapy today.  Treatment took place in water 3.5-4.5 feet deep depending upon activity.  Pt entered/exited pool via steps with bil rails, step to pattern. Min guard to enter pool, min to mod assist to advance feet up last few steps with exiting the pool. ? ?Use of large bar bell for gait from steps into water at ~4.0-4.3 foot depth min to mod assist for balance with the following: ?Forward gait for 18 feet across pool x 8 laps with cues on posture, step length ?Backward for 18 feet across pool x 6 laps with cues on posture, step length ?Side stepping for 18 feet across pool x 4 laps each right<>left, cues for posture, and to keep hips/pelvis in neutral position ? ?At wall in ~4.3 foot depth water, UE support at wall ?With aquatic step: ?Forward step ups with contralateral march for  10 reps each side ?Lateral step ups with contralateral kick out for 10 reps each side ? ?With 2# ankle weights on bil LEs, performed each ex for 15 reps each side with cues on form and technique. Min guard for safety ?Alternating hip abduction ?Alternating marching ?Alternating hip extension ? ?With use of bar bell for gait from deep end to bench in water with min to mod assist for balance ?Seated back on bench- performed each ex for 15 reps with cues on form. ?Long arc quads ?Hip abd/add ?Scissor kicks ? ?Seated forward on bench ?Sit to stands in parallel stance x 10 reps with min HHA for balance in standing ?Sit to stands in staggered stance x 10 reps each foot forward with min HHA for balance in standing ?  ?Use of  large bar bell for gait back into ~4.0 foot depth water ?With single bar bells working on static balance with feet hip width apart, min to mod assist for balance with cues on posture ?Moving arms in horizontal abd/add at water surface for 10 reps ?Alternating punching at water level for 10 reps  ? ? ? ?Pt requires buoyancy of water for support for reduced fall risk with gait training and balance exercises with minimal UE support; exercises able to be performed safely in water without the risk of fall compared to those same exercises performed on land;  viscosity of water needed for resistance for strengthening.  Current of water provides perturbations for challenging static & dynamic standing balance.    ? ? ? ? ? ? PT Short Term Goals - 05/29/21 1629   ? ?  ? PT SHORT TERM GOAL #1  ? Title ALL STGS = LTGS   ? ?  ?  ? ?  ? ? ? ? PT Long Term Goals - 05/29/21 1637   ? ?  ? PT LONG TERM GOAL #1  ? Title Pt and spouse will be independent with final HEP for BLE stretching and strengthening as well as aquatic HEP. ALL LTGS DUE 06/26/21   ? Baseline began to review land exercises on 05/29/21, will benefit from review before D/C   ? Time 4   ? Period Weeks   ? Status On-going   ? Target Date 06/26/21   ?  ? PT LONG TERM GOAL #4  ? Title Pt will stand at [facility] with min guard with no UE support with min guard for at least 20 seconds in order to demo improved balance for ADLs   ? Baseline 32.84 seconds on 09/12/20; 1 minute 4 seconds on 11/15/20, best time of 22 seconds on 03/01/21   ? Time 4   ? Period Weeks   ? Status On-going   ?  ? PT LONG TERM GOAL #5  ? Title Pt will ambulate at least 230' over level surfaces with RW and min guard consistently in order to demo improved household mobility and improved endurance   ? Baseline 345' in 3 separate bout of 115' (pt needing a rest break between each 115' today) min guard for first 2 laps.   ? Time 4   ? Period Weeks   ? Status On-going   ? Target Date 06/26/21   ? ?  ?  ? ?   ? ? ? ? ? ? ? ? Plan - 06/20/21 1055   ? ? Clinical Impression Statement Today's skilled session continued to focus on gait, strengthening and balance in the aquatic setting with no issues noted  or reported in session. The pt is making progress and should benefit from continued PT to progress toward unmet goals.   ? Personal Factors and Comorbidities Comorbidity 3+;Past/Current Experience   ? Comorbidities T8 paraplegia, diabetes, HTN   ? Examination-Activity Limitations Locomotion Level;Transfers;Stand   ? Examination-Participation Restrictions Community Activity;Valla Leaver Work   ? Stability/Clinical Decision Making Evolving/Moderate complexity   ? Rehab Potential Good   ? PT Frequency 2x / week   ? PT Duration 4 weeks   ? PT Treatment/Interventions Aquatic Therapy;Manual techniques;Therapeutic exercise;Gait training;Neuromuscular re-education;Orthotic Fit/Training   ? PT Next Visit Plan finalize HEP for land and aquatic therapy and give handout.  Plan to D/C at end of current visits. continue to work on gait distance, [place] for endurance/ROM/strengthening. BLE strengthening.   ? PT Home Exercise Plan Access Code: [email or web address]   ? Consulted and Agree with Plan of Care Patient   ? ?  ?  ? ?  ? ? ?Patient will benefit from skilled therapeutic intervention in order to improve the following deficits and impairments:  Decreased balance, Decreased coordination, Decreased range of motion, Decreased strength, Decreased mobility, Postural dysfunction, Impaired tone, Decreased activity tolerance, Abnormal gait, Decreased endurance, Difficulty walking, Impaired sensation ? ?Visit Diagnosis: ?Difficulty in walking, not elsewhere classified ? ?Unsteadiness on feet ? ?Muscle weakness (generalized) ? ?Abnormal posture ? ? ? ? ?Problem List ?Patient Active Problem List  ? Diagnosis Date Noted  ? Malnutrition of moderate degree 04/25/2021  ? Liver lesion   ? Urinary tract infection with hematuria   ? Lower GI bleed 04/24/2021  ? Anemia  04/24/2021  ? Essential hypertension 04/24/2021  ? Diabetes mellitus type 2 in nonobese ([place]) 04/24/2021  ? Acute GI bleeding 04/24/2021  ? Erectile dysfunction due to diseases classified elsewhere 03/02/2020  ? [place]

## 2021-06-21 ENCOUNTER — Encounter: Payer: Self-pay | Admitting: Physical Therapy

## 2021-06-21 ENCOUNTER — Ambulatory Visit: Payer: No Typology Code available for payment source | Admitting: Physical Therapy

## 2021-06-21 DIAGNOSIS — R2681 Unsteadiness on feet: Secondary | ICD-10-CM

## 2021-06-21 DIAGNOSIS — R293 Abnormal posture: Secondary | ICD-10-CM

## 2021-06-21 DIAGNOSIS — M6281 Muscle weakness (generalized): Secondary | ICD-10-CM

## 2021-06-21 DIAGNOSIS — R262 Difficulty in walking, not elsewhere classified: Secondary | ICD-10-CM | POA: Diagnosis not present

## 2021-06-21 NOTE — Therapy (Signed)
[facility] [place] ?[place] ?[place], [name], [address] ?Phone: [phone]   Fax:  [phone] ? ?Physical Therapy Treatment ? ?Patient Details  ?Name: Timothy Davenport ?MRN: [id number] ?Date of Birth: [birth year] ?Referring Provider (PT): Piva, Enrico,DO  ? ? ?Encounter Date: 06/21/2021 ? ? PT End of Session - 06/21/21 1107   ? ? Visit Number 79   ? Number of Visits 92   ? Date for PT Re-Evaluation 02/40/97   per re-cert on 3/53/29  ? Authorization Type VA - 15 visits from 03/20/21 - 09/16/21   ? Authorization - Visit Number 14   ? Authorization - Number of Visits 15   ? Progress Note Due on Visit 70   ? PT Start Time 1102   ? PT Stop Time 1145   ? PT Time Calculation (min) 43 min   ? Equipment Utilized During Treatment Gait belt   ? Activity Tolerance Patient tolerated treatment well   ? Behavior During Therapy [facility] for tasks assessed/performed   ? ?  ?  ? ?  ? ? ?Past Medical History:  ?Diagnosis Date  ? Colon polyp   ? Diabetes mellitus without complication ([place])   ? Diabetic neuropathy ([place])   ? HTN (hypertension)   ? Patella fracture   ? Renal calculi   ? ? ?Past Surgical History:  ?Procedure Laterality Date  ? LUMBAR LAMINECTOMY/DECOMPRESSION MICRODISCECTOMY N/A 07/01/2019  ? Procedure: LUMBAR LAMINECTOMY/DECOMPRESSION MICRODISCECTOMY 1 LEVEL, THORACIC SIX-SEVEN;  Surgeon: Consuella Lose, MD;  Location: [place];  Service: Neurosurgery;  Laterality: N/A;  LUMBAR LAMINECTOMY/DECOMPRESSION MICRODISCECTOMY 1 LEVEL, THORACIC SIX-SEVEN   ? ORIF PATELLA    ? THORACIC DISCECTOMY N/A 07/03/2019  ? Procedure: Evacuation of Thoracic Hematoma;  Surgeon: Consuella Lose, MD;  Location: [place];  Service: Neurosurgery;  Laterality: N/A;  ? ? ?There were no vitals filed for this visit. ? ? Subjective Assessment - 06/21/21 1106   ? ? Subjective No new complaints. No falls.or pain to report.   ? Pertinent History T8 paraplegia, diabetes, HTN   ? How long can you stand  comfortably? probably a couple minutes with RW.   ? How long can you walk comfortably? approx. 5-10 minutes with his son and RW.   ? Patient Stated Goals wants to be able to walk without any assistance   ? Currently in Pain? No/denies   ? Pain Score 0-No pain   ? ?  ?  ? ?  ? ? ? ? ? ? ? ? ? ? [facility] Adult PT Treatment/Exercise - 06/21/21 1109   ? ?  ? Transfers  ? Transfers Sit to Stand;Stand to Sit   ? Sit to Stand 4: Min guard;With upper extremity assist   ? Sit to Stand Details (indicate cue type and reason) from wheelchair and mat table with increased time/effort from lower surfaces   ? Stand to Sit 4: Min guard   ? Stand to Sit Details min guard with pt reaching back with left UE to assist with descent   ?  ? Ambulation/Gait  ? Ambulation/Gait Yes   ? Ambulation/Gait Assistance 4: Min guard   ? Ambulation/Gait Assistance Details seated rest break needed between laps. cues for increased right step length and step placement with right for decreased scissoring.   ? Ambulation Distance (Feet) 115 Feet   x2  ? Assistive device Rolling walker   ? Gait Pattern Decreased weight shift to right;Left flexed knee in stance;Right flexed knee in stance;Narrow  base of support;Decreased step length - right;Decreased step length - left;Decreased stance time - right;Decreased hip/knee flexion - right;Step-through pattern;Poor foot clearance - right   ? Ambulation Surface Level;Indoor   ?  ? Therapeutic Activites   ? Therapeutic Activities Other Therapeutic Activities   ? Other Therapeutic Activities working on unsupport standing: max of 5 seconds over several attempts with min guard to min assist. bil knees buckling limiting unsupported standing.   ?  ? Exercises  ? Exercises Other Exercises   ? Other Exercises  reviewed HEP with band advanced to red for resistant ex's. all ex's remain appropriate. Pt also has joined [employer] consultant to continue with community fitness, he is planning to work with a Physiological scientist and meeting with them  today.   ?  ? Knee/Hip Exercises: Machines for Strengthening  ? Total Gym Leg Press bil LE's 60# 1 set of 10 reps,  Cues for eccentric control. Pt able to transfer from w/c with supervision, needs assist to get RLE up on foot plate. then at 30#'s for single leg, 2 sets of 10 reps each side with assist for keeping foot on foot plate and support at knee to prevent hyperextension on left knee.   ? ?  ?  ? ?  ? ? ? ? ? ? ? ? ? ? ? ? PT Short Term Goals - 05/29/21 1629   ? ?  ? PT SHORT TERM GOAL #1  ? Title ALL STGS = LTGS   ? ?  ?  ? ?  ? ? ? ? PT Long Term Goals - 06/21/21 2046   ? ?  ? PT LONG TERM GOAL #1  ? Title Pt and spouse will be independent with final HEP for BLE stretching and strengthening as well as aquatic HEP. ALL LTGS DUE 06/26/21   ? Baseline 06/21/2021: met with land program. has one more aquatic session that pt is working to get family member to attend with him   ? Status Partially Met   ?  ? PT LONG TERM GOAL #4  ? Title Pt will stand at [facility] with min guard with no UE support with min guard for at least 20 seconds in order to demo improved balance for ADLs   ? Baseline 06/21/21: max time of 5 seconds this session (was best time of 22 seconds on 03/01/21)   ? Status Not Met   ?  ? PT LONG TERM GOAL #5  ? Title Pt will ambulate at least 230' over level surfaces with RW and min guard consistently in order to demo improved household mobility and improved endurance   ? Baseline 06/21/21: max distance of 115 feet before needing a rest break, able to go 2 laps of 115 feet each   ? Status Partially Met   ? ?  ?  ? ?  ? ? ? ? ? ? ? ? Plan - 06/21/21 1107   ? ? Clinical Impression Statement Today's skilled session focues on progress toward LTGs with HEP goal partially met (pt still has one more aquatic session to finalized that program). Unsupported standing and gait distance goals not met. Pt in agreement with discharge after final aquatic session.   ? Personal Factors and Comorbidities Comorbidity 3+;Past/Current  Experience   ? Comorbidities T8 paraplegia, diabetes, HTN   ? Examination-Activity Limitations Locomotion Level;Transfers;Stand   ? Examination-Participation Restrictions Community Activity;Valla Leaver Work   ? Stability/Clinical Decision Making Evolving/Moderate complexity   ? Rehab Potential Good   ?  PT Frequency 2x / week   ? PT Duration 4 weeks   ? PT Treatment/Interventions Aquatic Therapy;Manual techniques;Therapeutic exercise;Gait training;Neuromuscular re-education;Orthotic Fit/Training   ? PT Next Visit Plan finalize aquatic program at final session and then send to [facility] for discharge   ? PT Home Exercise Plan Access Code: [email or web address]   ? Consulted and Agree with Plan of Care Patient   ? ?  ?  ? ?  ? ? ?Patient will benefit from skilled therapeutic intervention in order to improve the following deficits and impairments:  Decreased balance, Decreased coordination, Decreased range of motion, Decreased strength, Decreased mobility, Postural dysfunction, Impaired tone, Decreased activity tolerance, Abnormal gait, Decreased endurance, Difficulty walking, Impaired sensation ? ?Visit Diagnosis: ?Difficulty in walking, not elsewhere classified ? ?Unsteadiness on feet ? ?Muscle weakness (generalized) ? ?Abnormal posture ? ? ? ? ?Problem List ?Patient Active Problem List  ? Diagnosis Date Noted  ? Malnutrition of moderate degree 04/25/2021  ? Liver lesion   ? Urinary tract infection with hematuria   ? Lower GI bleed 04/24/2021  ? Anemia 04/24/2021  ? Essential hypertension 04/24/2021  ? Diabetes mellitus type 2 in nonobese ([place]) 04/24/2021  ? Acute GI bleeding 04/24/2021  ? Erectile dysfunction due to diseases classified elsewhere 03/02/2020  ? 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 Spasticity 08/03/2019  ? Paraplegia ([place]) 07/07/2019  ? Neurogenic bowel 07/07/2019  ? Neurogenic bladder 07/07/2019  ? 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 ? ?Willow Ora, PTA, CLT ?[place] ?[address], [address] ?[place],  [place] [address] ?[phone] ?06/21/21, 9:03 PM  ? ?Name: Timothy Davenport ?MRN: [id number] ?Date of Birth: 1950-07-16 ? ? ? ?

## 2021-07-03 ENCOUNTER — Ambulatory Visit: Payer: Self-pay | Admitting: Physical Therapy

## 2021-08-23 ENCOUNTER — Other Ambulatory Visit (HOSPITAL_COMMUNITY): Payer: Self-pay

## 2021-08-24 ENCOUNTER — Other Ambulatory Visit (HOSPITAL_COMMUNITY): Payer: Self-pay

## 2021-09-22 ENCOUNTER — Encounter
Payer: No Typology Code available for payment source | Attending: Physical Medicine and Rehabilitation | Admitting: Physical Medicine and Rehabilitation

## 2021-09-22 ENCOUNTER — Encounter: Payer: Self-pay | Admitting: Physical Medicine and Rehabilitation

## 2021-09-22 VITALS — BP 96/62 | HR 66 | Ht 71.0 in

## 2021-09-22 DIAGNOSIS — Z993 Dependence on wheelchair: Secondary | ICD-10-CM | POA: Insufficient documentation

## 2021-09-22 DIAGNOSIS — R252 Cramp and spasm: Secondary | ICD-10-CM | POA: Diagnosis present

## 2021-09-22 DIAGNOSIS — G8220 Paraplegia, unspecified: Secondary | ICD-10-CM | POA: Insufficient documentation

## 2021-09-22 DIAGNOSIS — K592 Neurogenic bowel, not elsewhere classified: Secondary | ICD-10-CM | POA: Insufficient documentation

## 2021-09-22 DIAGNOSIS — N319 Neuromuscular dysfunction of bladder, unspecified: Secondary | ICD-10-CM | POA: Insufficient documentation

## 2021-09-22 NOTE — Patient Instructions (Signed)
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  Also has hepatic lesions- on liver- dx'd several years ago.   SCI support group- Last Thursday of the month- 6-7 pm at [employer] in conference room.   2.  SCI panel discussion- September 28th at 3 or 4pm- for 1 hour- at [facility]- to help train the new staff on SCI.   3. Need to check CMP/LFTs- will see if can be done at [place]- if [place] will not allow that, then will need to have [place] do it. Is established at [facility].  Need to do q 79month while on Dantrolene, which is working great.  Due to risk of LFT elevation.   4. Con't Baclofen 20 mg 3x/day and Dantrolene- 100 mg 3x/day-   5. Con't Gabapentin- 800 mg 3x/day- for nerve pain- - would suggest going down by 400 mg every 2 weeks-  So first week 400/800/800 x  2 weeks, then 400/400/800 x 2 weeks, then 400 mg 3x/day x 2 weeks, then 400 mg 2x/day x 2 weeks, then 400 mg nightly x 2 weeks, then stop. If nerve pain comes back any time, just bump to previous dose.   6. Hospital bed to be fixed by [employer] remote control isn't working.   7. Can do dig stim when feels like needs to have spontaneous BM- to see if can avoid having to do dig stim/bowel program later in day.   8. F/U in 3 months double appt- SCI

## 2021-09-22 NOTE — Progress Notes (Signed)
Subjective:    Patient ID: Timothy Davenport, male    DOB: 03-07-1950, 71 y.o.   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  Also has hepatic lesions- on liver- dx'd several years ago.   Not driving yet.  Has worked with [facility] about driving- waiting for them to ok the modifications for his truck.   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  Did another assessment 1 week ago and 3 months prior was also assessed- has made "Tremendous progress" esp UB strength.   Numbness is hands is now rare- now and then- not like it was before.   Spasticity not half as bad as it was Gets spasms a lot when transferring out of w/c mainly- Tightness is about the same- still taking Dantrolene-   Caths q4 hours- no issues Bowel program-  going well  Is able to pee on own- then does BM-  They are connected- when feels like needs to pee, is able to - very little comes out each time- is on Oxybutynin Rarely uses suppository- mainly uses dig stim-   Hospital bed broken for 2 weeks- getting in the way of voiding.    Pain Inventory Average Pain 0 Pain Right Now 0 My pain is  no pain  In the last 24 hours, has pain interfered with the following? General activity 1 Relation with others 0 Enjoyment of life 1 What TIME of day is your pain at its worst? morning  Sleep (in general) Fair  Pain is worse with: bending Pain improves with: medication Relief from Meds: 1  Family History  Problem Relation Age of Onset   Hypertension Mother    Hypertension Father    Social History   Socioeconomic History   Marital status: Married    Spouse name: Not on file   Number of children: Not on file   Years of  education: Not on file   Highest education level: Not on file  Occupational History   Not on file  Tobacco Use   Smoking status: Never   Smokeless tobacco: Never  Vaping Use   Vaping Use: Never used  Substance and Sexual Activity   Alcohol use: Yes    Comment: 2-3 drinks week   Drug use: Yes    Types: Marijuana    Comment: occasionally   Sexual activity: Not Currently  Other Topics Concern   Not on file  Social History Narrative   Not on file   Social Determinants of Health   Financial Resource Strain: Not on file  Food Insecurity: Not on file  Transportation Needs: Not on file  Physical Activity: Not on file  Stress: Not on file  Social Connections: Not on file   Past Surgical History:  Procedure Laterality Date   LUMBAR LAMINECTOMY/DECOMPRESSION MICRODISCECTOMY N/A 07/01/2019   Procedure: LUMBAR LAMINECTOMY/DECOMPRESSION MICRODISCECTOMY 1 LEVEL, THORACIC SIX-SEVEN;  Surgeon: Consuella Lose, MD;  Location: [place];  Service: Neurosurgery;  Laterality: N/A;  LUMBAR LAMINECTOMY/DECOMPRESSION MICRODISCECTOMY 1 LEVEL, THORACIC SIX-SEVEN    ORIF PATELLA     THORACIC DISCECTOMY N/A 07/03/2019   Procedure: Evacuation of Thoracic Hematoma;  Surgeon: Consuella Lose, MD;  Location: [facility]  OR;  Service: Neurosurgery;  Laterality: N/A;   Past Surgical History:  Procedure Laterality Date   LUMBAR LAMINECTOMY/DECOMPRESSION MICRODISCECTOMY N/A 07/01/2019   Procedure: LUMBAR LAMINECTOMY/DECOMPRESSION MICRODISCECTOMY 1 LEVEL, THORACIC SIX-SEVEN;  Surgeon: Consuella Lose, MD;  Location: [place];  Service: Neurosurgery;  Laterality: N/A;  LUMBAR LAMINECTOMY/DECOMPRESSION MICRODISCECTOMY 1 LEVEL, THORACIC SIX-SEVEN    ORIF PATELLA     THORACIC DISCECTOMY N/A 07/03/2019   Procedure: Evacuation of Thoracic Hematoma;  Surgeon: Consuella Lose, MD;  Location: [place];  Service: Neurosurgery;  Laterality: N/A;   Past Medical History:  Diagnosis Date   Colon polyp    Diabetes mellitus  without complication (HCC)    Diabetic neuropathy (HCC)    HTN (hypertension)    Patella fracture    Renal calculi    BP 96/62   Pulse 66   Ht '5\' 11"'$  (1.803 m)   SpO2 94%   BMI 21.34 kg/m   Opioid Risk Score:   Fall Risk Score:  `1  Depression screen [facility] 2/9     09/22/2021    1:02 PM 06/07/2021   11:12 AM 03/08/2021   11:04 AM 11/30/2020   11:07 AM 03/02/2020   11:22 AM 11/27/2019    3:29 PM 08/03/2019   10:14 AM  Depression screen PHQ 2/9  Decreased Interest 0 0 0 0 '1 1 3  '$ Down, Depressed, Hopeless 0 0 0 0 1 1 0  PHQ - 2 Score 0 0 0 0 '2 2 3  '$ Altered sleeping       0  Tired, decreased energy       0  Change in appetite       1  Feeling bad or failure about yourself        0  Trouble concentrating       0  Moving slowly or fidgety/restless       0  Suicidal thoughts       0  PHQ-9 Score       4  Difficult doing work/chores       Not difficult at all     Review of Systems  Constitutional: Negative.   HENT: Negative.    Eyes: Negative.   Respiratory: Negative.    Cardiovascular: Negative.   Gastrointestinal: Negative.   Endocrine: Negative.   Genitourinary: Negative.   Musculoskeletal:  Positive for gait problem.  Skin: Negative.   Allergic/Immunologic: Negative.   Hematological: Negative.   Psychiatric/Behavioral: Negative.        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  Also has hepatic lesions- on liver- dx'd several years ago.   SCI support group- Last Thursday of the month- 6-7 pm  at [facility] in conference room.   2.  SCI panel discussion- September 28th at 3 or 4pm- for 1 hour- at [facility]- to help train the new staff on SCI.   3. Need to check CMP/LFTs- will see if can be done at [place]- if [place] will not allow that, then will need to have [place] do it. Is established at [facility].  Need to do q 33month while on Dantrolene, which is working great.  Due to risk of LFT elevation.   4. Con't Baclofen 20 mg 3x/day and Dantrolene- 100 mg 3x/day-   5. Con't Gabapentin- 800 mg 3x/day- for nerve pain- - would suggest going down by 400 mg every 2 weeks-  So first week 400/800/800 x  2 weeks, then 400/400/800 x 2 weeks, then 400 mg 3x/day x 2 weeks, then 400 mg 2x/day x 2 weeks, then 400 mg nightly x 2 weeks, then stop. If nerve pain comes back any time, just bump to previous dose.   6. [facility] remote control isn't working.   7. Can do dig stim when feels like needs to have spontaneous BM- to see if can avoid having to do dig stim/bowel program later in day.   8. F/U in 3 months double appt- SCI   I spent a total of 36   minutes on total care today- >50% coordination of care- due to discussion about bowel program and SCI support group- as well as discussing about nerve pain and how to wean meds.

## 2021-10-20 ENCOUNTER — Telehealth: Payer: Self-pay | Admitting: Physical Medicine and Rehabilitation

## 2021-10-20 ENCOUNTER — Encounter: Payer: Self-pay | Admitting: *Deleted

## 2021-10-20 NOTE — Telephone Encounter (Signed)
[place] at [facility] in conference room- inside first floor on R side- the last Tuesday of the month- I think it's 9/28 next time- 6-7 pm- can you let him know- thanks- if he needs [place] to, we can mail a flyer as well- thanks - ML

## 2021-10-20 NOTE — Telephone Encounter (Signed)
Notified via mychart message and left VM on his phone with information.

## 2021-10-20 NOTE — Telephone Encounter (Signed)
Sorry he didn't show up at meeting last night and is still interested in meeting with the group please let him know date time and location

## 2022-01-03 ENCOUNTER — Emergency Department (HOSPITAL_COMMUNITY): Payer: No Typology Code available for payment source

## 2022-01-03 ENCOUNTER — Other Ambulatory Visit: Payer: Self-pay

## 2022-01-03 ENCOUNTER — Emergency Department (HOSPITAL_COMMUNITY)
Admission: EM | Admit: 2022-01-03 | Discharge: 2022-01-03 | Disposition: A | Discharge status: Home / Self Care | Payer: No Typology Code available for payment source | Attending: Emergency Medicine | Admitting: Emergency Medicine

## 2022-01-03 DIAGNOSIS — S3094XA Unspecified superficial injury of scrotum and testes, initial encounter: Secondary | ICD-10-CM | POA: Diagnosis present

## 2022-01-03 DIAGNOSIS — N3001 Acute cystitis with hematuria: Secondary | ICD-10-CM | POA: Insufficient documentation

## 2022-01-03 DIAGNOSIS — S3022XA Contusion of scrotum and testes, initial encounter: Secondary | ICD-10-CM | POA: Insufficient documentation

## 2022-01-03 DIAGNOSIS — Z7982 Long term (current) use of aspirin: Secondary | ICD-10-CM | POA: Diagnosis not present

## 2022-01-03 DIAGNOSIS — R410 Disorientation, unspecified: Secondary | ICD-10-CM | POA: Diagnosis not present

## 2022-01-03 DIAGNOSIS — N481 Balanitis: Secondary | ICD-10-CM | POA: Diagnosis not present

## 2022-01-03 DIAGNOSIS — X58XXXA Exposure to other specified factors, initial encounter: Secondary | ICD-10-CM | POA: Diagnosis not present

## 2022-01-03 LAB — URINALYSIS, ROUTINE W REFLEX MICROSCOPIC
Bilirubin Urine: NEGATIVE
Glucose, UA: 150 mg/dL — AB
Ketones, ur: NEGATIVE mg/dL
Nitrite: NEGATIVE
Protein, ur: NEGATIVE mg/dL
Specific Gravity, Urine: 1.016 (ref 1.005–1.030)
WBC, UA: >50 WBC/hpf — ABNORMAL HIGH (ref 0–5)
pH: 5 (ref 5.0–8.0)

## 2022-01-03 LAB — CBC WITH DIFFERENTIAL/PLATELET
Abs Immature Granulocytes: 0.08 10*3/uL — ABNORMAL HIGH (ref 0.00–0.07)
Basophils Absolute: 0 10*3/uL (ref 0.0–0.1)
Basophils Relative: 0 %
Eosinophils Absolute: 0 10*3/uL (ref 0.0–0.5)
Eosinophils Relative: 0 %
HCT: 43.8 % (ref 39.0–52.0)
Hemoglobin: 14 g/dL (ref 13.0–17.0)
Immature Granulocytes: 1 %
Lymphocytes Relative: 8 %
Lymphs Abs: 0.9 10*3/uL (ref 0.7–4.0)
MCH: 25.2 pg — ABNORMAL LOW (ref 26.0–34.0)
MCHC: 32 g/dL (ref 30.0–36.0)
MCV: 78.8 fL — ABNORMAL LOW (ref 80.0–100.0)
Monocytes Absolute: 0.2 10*3/uL (ref 0.1–1.0)
Monocytes Relative: 2 %
Neutro Abs: 9.9 10*3/uL — ABNORMAL HIGH (ref 1.7–7.7)
Neutrophils Relative %: 89 %
Platelets: 248 10*3/uL (ref 150–400)
RBC: 5.56 MIL/uL (ref 4.22–5.81)
RDW: 17.2 % — ABNORMAL HIGH (ref 11.5–15.5)
WBC: 11.1 10*3/uL — ABNORMAL HIGH (ref 4.0–10.5)
nRBC: 0 % (ref 0.0–0.2)

## 2022-01-03 LAB — BASIC METABOLIC PANEL
Anion gap: 10 (ref 5–15)
BUN: 30 mg/dL — ABNORMAL HIGH (ref 8–23)
CO2: 25 mmol/L (ref 22–32)
Calcium: 10.7 mg/dL — ABNORMAL HIGH (ref 8.9–10.3)
Chloride: 104 mmol/L (ref 98–111)
Creatinine, Ser: 1.26 mg/dL — ABNORMAL HIGH (ref 0.61–1.24)
GFR, Estimated: >60 mL/min (ref >60)
Glucose, Bld: 145 mg/dL — ABNORMAL HIGH (ref 70–99)
Potassium: 3.7 mmol/L (ref 3.5–5.1)
Sodium: 139 mmol/L (ref 135–145)

## 2022-01-03 LAB — HIV ANTIBODY (ROUTINE TESTING W REFLEX): HIV Screen 4th Generation wRfx: NONREACTIVE

## 2022-01-03 MED ORDER — FLUCONAZOLE 150 MG PO TABS: 150.0000 mg | ORAL_TABLET | Freq: Every day | ORAL | 0 refills | Status: DC

## 2022-01-03 MED ORDER — ONDANSETRON 4 MG PO TBDP
4.0000 mg | ORAL_TABLET | Freq: Once | ORAL | Status: AC | PRN
Start: 2022-01-03 — End: 2022-01-03
  Administered 2022-01-03: 4 mg via ORAL
  Filled 2022-01-03: qty 1

## 2022-01-03 MED ORDER — CEFADROXIL 500 MG PO CAPS: 500.0000 mg | ORAL_CAPSULE | Freq: Two times a day (BID) | ORAL | 0 refills | Status: DC

## 2022-01-03 NOTE — Discharge Instructions (Addendum)
As we discussed the blood, swelling in the testicles will resolve on its own over time, if you notice significant worsening swelling, pain, or the testicles become extremely warm to the touch I recommend that you return for further evaluation of possible abscess or infection, additionally he had a UTI which may be minimally contributing to the confusion that you are noting, however I have placed an ambulatory referral to neurology for him to be scheduled for an outpatient MRI, if you have not heard from neurology in a few days I recommend calling the contact information I provided.  Please finish the entire course of antibiotics that I prescribed unless you hear from our pharmacist regarding his urine culture, and take the antifungal tablets 1 today, and 1 in 1 week if he continues to have any white discharge or irritation around the head of the penis.  As we discussed I do recommend using some topical over-the-counter antifungals as well as cleaning the head of the penis thoroughly with bathing

## 2022-01-03 NOTE — ED Triage Notes (Signed)
Pt. Stated, testicle swelling on the left for about 5 days.

## 2022-01-03 NOTE — ED Notes (Signed)
Pt. Also reports having nausea.

## 2022-01-03 NOTE — ED Provider Triage Note (Signed)
Emergency Medicine Provider Triage Evaluation Note  Timothy Davenport , a 71 y.o. male  was evaluated in triage.  Pt complains of testicle pain and swelling x5 days.  Also noticing skin discoloration over the testicle and penile discharge.  Denies any sexual activity.  Endorses some mild burning when he urinates.  No abdominal pain, nausea, vomiting, fevers..  Review of Systems  Per HPI  Physical Exam  Ht '5\' 11"'$  (1.803 m)   Wt 81.6 kg   BMI 25.10 kg/m  Gen:   Awake, no distress   Resp:  Normal effort  MSK:   Moves extremities without difficulty  Other:    Medical Decision Making  Medically screening exam initiated at 9:35 AM.  Appropriate orders placed.  SHAMUS DESANTIS was informed that the remainder of the evaluation will be completed by another provider, this initial triage assessment does not replace that evaluation, and the importance of remaining in the ED until their evaluation is complete.  Given penile discharge we will proceed to check STD panel    Sherrill Raring, PA-C 01/03/22 [id number]

## 2022-01-03 NOTE — ED Provider Notes (Signed)
[facility] DEPARTMENT Provider Note   CSN: [id number] Arrival date & time: 01/03/22  0254     History  Chief Complaint  Patient presents with   Testicle Pain   Groin Swelling   Penile Discharge    Timothy Davenport is a 71 y.o. 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  First wife reports increased confusion, garbled speech, some answers to questions that are inappropriate, general increased confusion, occasional decreased coordination with upper extremities when transferring over the last 2 to 3 weeks.  No previous evaluation for these questionable strokelike symptoms.  Additionally patient with some questionable penile discharge, testicle swelling central to left-sided for around 5 days.  He denies any known injury.  He reports not sexually active for the last 3 years, no dysuria.  He reports intact sensation of the penis and scrotum despite spinal cord injury.  He intermittently catheterizes.  He denies any systemic fever, chills, nausea, vomiting.  He reports no previous stroke, takes a baby aspirin but no other blood thinners.     Testicle Pain  Penile Discharge       Home Medications Prior to Admission medications   Medication Sig Start Date End Date Taking? Authorizing Provider  cefadroxil (DURICEF) 500 MG capsule Take 1 capsule (500 mg total) by mouth 2 (two) times daily. 01/03/22  Yes Fiorella Hanahan H, PA-C  fluconazole (DIFLUCAN) 150 MG tablet Take 1 tablet (150 mg total) by mouth daily for 2 doses. Take 1 dose immediately, and another dose in 7 days if you are having any ongoing yeast infection symptoms 01/03/22 01/05/22 Yes Etrulia Zarr H, PA-C  acetaminophen (TYLENOL) 325 MG tablet Take 1-2 tablets (325-650 mg total) by mouth every 4 (four) hours as needed for mild pain. 07/20/19    Love, Ivan Anchors, PA-C  Alogliptin Benzoate 25 MG TABS Take 25 tablets by mouth daily with breakfast.    [provider]  ammonium lactate (AMLACTIN) 12 % cream Apply 1 application. topically 2 (two) times daily as needed for dry skin. 01/11/20   [provider]  aspirin EC 81 MG tablet Take 1 tablet (81 mg total) by mouth daily. Swallow whole. 04/30/21   Elgergawy, Silver Huguenin, MD  baclofen (LIORESAL) 20 MG tablet Take 1 tablet by mouth 3 (three) times daily. 01/11/20   [provider]  bisacodyl (DULCOLAX) 10 MG suppository UNWRAP AND INSERT 1 SUPPOSITORY(10 MG) RECTALLY DAILY AFTER SUPPER Patient taking differently: Place 10 mg rectally daily as needed for mild constipation. UNWRAP AND INSERT 1 SUPPOSITORY(10 MG) RECTALLY DAILY AFTER SUPPER 08/21/19   Raulkar, Clide Deutscher, MD  Cholecalciferol 100 MCG (4000 UT) TABS Take 4,000 Units by mouth daily. 01/10/21   [provider]  dantrolene (DANTRIUM) 100 MG capsule Take 1 capsule (100 mg total) by mouth 3 (three) times daily. 03/08/21   Lovorn, Jinny Blossom, MD  empagliflozin (JARDIANCE) 25 MG TABS tablet Take 12.5 mg by mouth daily. 10/20/20   [provider]  fluticasone (FLONASE) 50 MCG/ACT nasal spray Place 2 sprays into both nostrils daily. 02/10/20   [provider]  gabapentin (NEURONTIN) 400 MG capsule Take 800 mg by mouth 3 (three) times daily. 01/28/20   [provider]  glucose 4 GM chewable tablet Chew 4 tablets by mouth as needed for low blood sugar.    [provider]  insulin aspart (NOVOLOG) 100 UNIT/ML  FlexPen Inject 8 Units into the skin at bedtime. 06/14/20   [provider]  lidocaine (XYLOCAINE) 2 % jelly Apply 1 application topically at bedtime as needed (apply to rectum with digital stim).    [provider]  oxybutynin (DITROPAN) 5 MG tablet Take 7.5 mg by mouth 2 (two) times daily.    [provider]  pioglitazone (ACTOS) 30 MG tablet Take 30 mg by  mouth daily.    [provider]  Polyethylene Glycol 3350 POWD Take 1 Dose by mouth daily as needed (constipation). Take one capful by mouth every day in 8 ounces of liquid as needed    [provider]  polyvinyl alcohol (LIQUIFILM TEARS) 1.4 % ophthalmic solution Place 1 drop into both eyes 4 (four) times daily. 07/20/19   Love, Ivan Anchors, PA-C  senna-docusate (SENOKOT-S) 8.6-50 MG tablet Take 2 tablets by mouth daily. 10/20/20   [provider]  sertraline (ZOLOFT) 100 MG tablet Take 100 mg by mouth daily.    [provider]  sildenafil (VIAGRA) 100 MG tablet Take 100 mg by mouth daily as needed for erectile dysfunction. Take one hour prior to sexual activity **Do NOT exceed 1 dose per 24 hour period**    [provider]  simvastatin (ZOCOR) 20 MG tablet Take 20 mg by mouth daily at 6 PM.    [provider]      Allergies    Lisinopril and Enalapril-hctz [enalapril-hydrochlorothiazide]    Review of Systems   Review of Systems  Genitourinary:  Positive for penile discharge and testicular pain.  All other systems reviewed and are negative.   Physical Exam Updated Vital Signs BP 108/83   Pulse 93   Temp 98.8 F (37.1 C) (Oral)   Resp 16   Ht '5\' 11"'$  (1.803 m)   Wt 81.6 kg   SpO2 99%   BMI 25.10 kg/m  Physical Exam Vitals and nursing note reviewed.  Constitutional:      General: He is not in acute distress.    Appearance: Normal appearance.  HENT:     Head: Normocephalic and atraumatic.  Eyes:     General:        Right eye: No discharge.        Left eye: No discharge.  Cardiovascular:     Rate and Rhythm: Normal rate and regular rhythm.     Heart sounds: No murmur heard.    No friction rub. No gallop.  Pulmonary:     Effort: Pulmonary effort is normal.     Breath sounds: Normal breath sounds.  Abdominal:     General: Bowel sounds are normal.     Palpations: Abdomen is soft.  Genitourinary:    Comments: Overall normal  appearance of uncircumcised penis without significant purulent discharge noted, he does have swollen testicles bilaterally with a ecchymotic region in the central area between the 2 testicles.  Which is minimally tender but not excruciating, not indurated, warm to the touch. Skin:    General: Skin is warm and dry.     Capillary Refill: Capillary refill takes less than 2 seconds.  Neurological:     Mental Status: He is alert and oriented to person, place, and time.     Comments: Intact strength 5/5 bilateral upper extremities with normal coordination.  Patient answers questions appropriately, questionable garbled speech although of not familiar with this patient's baseline although wife does report that he sounds slightly different than normal.  CN II through XII grossly  intact.  Psychiatric:        Mood and Affect: Mood normal.        Behavior: Behavior normal.     ED Results / Procedures / Treatments   Labs (all labs ordered are listed, but only abnormal results are displayed) Labs Reviewed  CBC WITH DIFFERENTIAL/PLATELET - Abnormal; Notable for the following components:      Result Value   WBC 11.1 (*)    MCV 78.8 (*)    MCH 25.2 (*)    RDW 17.2 (*)    Neutro Abs 9.9 (*)    Abs Immature Granulocytes 0.08 (*)    All other components within normal limits  BASIC METABOLIC PANEL - Abnormal; Notable for the following components:   Glucose, Bld 145 (*)    BUN 30 (*)    Creatinine, Ser 1.26 (*)    Calcium 10.7 (*)    All other components within normal limits  URINALYSIS, ROUTINE W REFLEX MICROSCOPIC - Abnormal; Notable for the following components:   APPearance TURBID (*)    Glucose, UA 150 (*)    Hgb urine dipstick SMALL (*)    Leukocytes,Ua LARGE (*)    WBC, UA >50 (*)    Bacteria, UA MANY (*)    All other components within normal limits  URINE CULTURE  HIV ANTIBODY (ROUTINE TESTING W REFLEX)  RPR  LIPASE, BLOOD  CBC  GC/CHLAMYDIA PROBE AMP ([facility]) NOT AT [facility]     EKG None  Radiology CT Head Wo Contrast  Result Date: 01/03/2022 CLINICAL DATA:  Neurological deficit, nausea EXAM: CT HEAD WITHOUT CONTRAST TECHNIQUE: Contiguous axial images were obtained from the base of the skull through the vertex without intravenous contrast. RADIATION DOSE REDUCTION: This exam was performed according to the departmental dose-optimization program which includes automated exposure control, adjustment of the mA and/or kV according to patient size and/or use of iterative reconstruction technique. COMPARISON:  07/22/2019 FINDINGS: Brain: No acute intracranial findings are seen. There are no signs of bleeding within the cranium. Minimal calcifications are seen in basal ganglia. Ventricles are not dilated. Cortical sulci are prominent. There is 8 mm extra-axial calcification inseparable from the meninges in the left frontoparietal region. There is no adjacent edema or mass effect. This finding appears stable. Vascular: Unremarkable. Skull: No fracture is seen. Sinuses/Orbits: Unremarkable. Other: There is increased amount of CSF in the sella suggesting partial empty sella. IMPRESSION: No acute intracranial findings are seen in noncontrast CT brain. Atrophy. 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 Electronically Signed   By: Elmer Picker M.D.   On: 01/03/2022 20:04   US SCROTUM W/DOPPLER  Result Date: 01/03/2022 CLINICAL DATA:  Scrotal pain. EXAM: SCROTAL ULTRASOUND DOPPLER ULTRASOUND OF THE TESTICLES TECHNIQUE: Complete ultrasound examination of the testicles, epididymis, and other scrotal structures was performed. Color and spectral Doppler ultrasound were also utilized to evaluate blood flow to the testicles. COMPARISON:  None Available. FINDINGS: Right testicle Measurements: 3.3 x 2.0  x 2.8 cm. Symmetric and homogeneous echotexture without focal lesion. Patent arterial and venous blood flow. Left testicle Measurements: 3.2 x 2.2 x 3.0 cm. Symmetric and homogeneous echotexture without focal lesion. Patent arterial and venous blood flow. Right epididymis:  1 cm epididymal cyst. Left epididymis:  Normal in size and appearance. Hydrocele: Moderate-sized bilateral hydroceles. There is a large complex fluid collection in the  scrotum between the testicles. This measures a maximum of 5 x 3 cm and contains echogenic floating debris and could be a liquified hematoma or an abscess. Recommend correlation with clinical findings. Varicocele:  None visualized. Pulsed Doppler interrogation of both testes demonstrates normal low resistance arterial and venous waveforms bilaterally. IMPRESSION: 1. Normal sonographic appearance of both testicles with normal blood flow. 2. Moderate-sized bilateral hydroceles. 3. Large complex fluid collection in the scrotum between the testicles. This could be a liquified hematoma or an abscess. Recommend correlation with clinical findings. Electronically Signed   By: Marijo Sanes M.D.   On: 01/03/2022 11:11    Procedures Procedures    Medications Ordered in ED Medications  ondansetron (ZOFRAN-ODT) disintegrating tablet 4 mg (4 mg Oral Given 01/03/22 1241)    ED Course/ Medical Decision Making/ A&P                           Medical Decision Making Amount and/or Complexity of Data Reviewed Labs: ordered. Radiology: ordered.  Risk Prescription drug management.   This patient is a 71 y.o. male who presents to the ED for concern of testicle swelling, pain, penile discharge, as well as confusion, memory deficits, decreased coordination, this involves an extensive number of treatment options, and is a complaint that carries with it a high risk of complications and morbidity. The emergent differential diagnosis prior to evaluation includes, but is not limited to,  UTI, epididymitis, scrotal abscess, other testicle injury, testicular torsion, new stroke, electrolyte abnormality, dementia, versus other acute cause of confusion, could be potentially related to bacterial infection of the urine.   This is not an exhaustive differential.   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 Pertinent results include: Reviewed outpatient PCP visits, no recent lab work, imaging from emergency department visits, but did review remote admission from March of this year  Physical Exam: Physical exam performed. The pertinent findings include: On exam patient with no focal neurologic deficits, questionable slightly garbled speech, however I am not familiar with this patient's baseline.  On exam of the scrotum patient does have findings that are consistent with a hematoma, or traumatic injury of the scrotum without any redness, induration, warmth, or excruciating tenderness to palpation.  The head of the penis has some findings of balanitis, without any evidence of penile discharge on stimulation of the shaft.  No high riding appearance of testicles.  Otherwise patient with stable vital signs, no acute distress.  Lab Tests: I ordered, and personally interpreted labs.  The pertinent results include: CBC is overall unremarkable, he has a very mild leukocytosis white blood cells 11.1.  His BMP is notable for a very mild elevated creatinine at 1.26, mild hyperglycemia glucose 145.  His GC chlamydia, RPR are pending, his UA is remarkable for large leukocytes, greater than 50 white blood cells, many bacteria, white blood cell clumps, as well as some budding yeast.  Although the yeast questionably contaminant we will go ahead and treat especially given his history of longstanding diabetes.   Imaging Studies: I ordered imaging studies  including CT head without contrast, scrotal ultrasound. I independently visualized and interpreted imaging which showed questionable scrotal hematoma versus abscess, as well as. I agree with the radiologist interpretation.   Medications: I ordered medication including Zofran for nausea. Reevaluation of the patient after these medicines showed that the  patient improved. I have reviewed the patients home medicines and have made adjustments as needed.  Disposition: After consideration of the diagnostic results and the patients response to treatment, I feel that patient will be treated with ice, scrotal elevation, keflex for uti, diflucan for yeast in urine, topical antifungal for mild balanitis, outpatient follow up for subacute questionable neuro deficit.   emergency department workup does not suggest an emergent condition requiring admission or immediate intervention beyond what has been performed at this time. The plan is: as above. The patient is safe for discharge and has been instructed to return immediately for worsening symptoms, change in symptoms or any other concerns.  I discussed this case with my attending physician Dr. Rogene Houston who cosigned this note including patient's presenting symptoms, physical exam, and planned diagnostics and interventions. Attending physician stated agreement with plan or made changes to plan which were implemented.    Final Clinical Impression(s) / ED Diagnoses Final diagnoses:  Scrotal hematoma  Acute cystitis with hematuria  Balanitis  Confusion    Rx / DC Orders ED Discharge Orders          Ordered    Ambulatory referral to Neurology       Comments: An appointment is requested in approximately: 2 weeks. Patient with 2-3 weeks confusion, some decreased coordination with transferring. No acute neuro deficits in ED, unremarkable head CT.   01/03/22 2108    cefadroxil (DURICEF) 500 MG capsule  2 times daily        01/03/22 2108    fluconazole  (DIFLUCAN) 150 MG tablet  Daily        01/03/22 2108              Dorien Chihuahua 01/03/22 2126    Fredia Sorrow, MD 01/09/22 1536

## 2022-01-04 LAB — GC/CHLAMYDIA PROBE AMP (~~LOC~~) NOT AT ARMC
Chlamydia: NEGATIVE
Comment: NEGATIVE
Comment: NORMAL
Neisseria Gonorrhea: NEGATIVE

## 2022-01-04 LAB — RPR: RPR Ser Ql: NONREACTIVE

## 2022-01-05 ENCOUNTER — Inpatient Hospital Stay (HOSPITAL_COMMUNITY): Payer: Medicare Other | Admitting: Certified Registered"

## 2022-01-05 ENCOUNTER — Other Ambulatory Visit: Payer: Self-pay

## 2022-01-05 ENCOUNTER — Encounter (HOSPITAL_COMMUNITY)
Admission: EM | Disposition: A | Discharge status: Nursing Home (Includes ICF) | Payer: Self-pay | Source: Home / Self Care | Attending: Internal Medicine

## 2022-01-05 ENCOUNTER — Other Ambulatory Visit: Payer: Self-pay | Admitting: Urology

## 2022-01-05 ENCOUNTER — Encounter (HOSPITAL_COMMUNITY): Payer: Self-pay

## 2022-01-05 ENCOUNTER — Emergency Department (HOSPITAL_COMMUNITY): Payer: Medicare Other

## 2022-01-05 ENCOUNTER — Inpatient Hospital Stay (HOSPITAL_COMMUNITY)
Admission: EM | Admit: 2022-01-05 | Discharge: 2022-01-24 | DRG: 711 | Disposition: A | Discharge status: Other Acute Inpatient Hospital | Payer: Medicare Other | Attending: Internal Medicine | Admitting: Internal Medicine

## 2022-01-05 DIAGNOSIS — I10 Essential (primary) hypertension: Secondary | ICD-10-CM | POA: Diagnosis not present

## 2022-01-05 DIAGNOSIS — W19XXXA Unspecified fall, initial encounter: Secondary | ICD-10-CM | POA: Diagnosis not present

## 2022-01-05 DIAGNOSIS — G8220 Paraplegia, unspecified: Secondary | ICD-10-CM | POA: Diagnosis present

## 2022-01-05 DIAGNOSIS — Z79899 Other long term (current) drug therapy: Secondary | ICD-10-CM | POA: Diagnosis not present

## 2022-01-05 DIAGNOSIS — Z7982 Long term (current) use of aspirin: Secondary | ICD-10-CM

## 2022-01-05 DIAGNOSIS — E1122 Type 2 diabetes mellitus with diabetic chronic kidney disease: Secondary | ICD-10-CM | POA: Diagnosis present

## 2022-01-05 DIAGNOSIS — E1152 Type 2 diabetes mellitus with diabetic peripheral angiopathy with gangrene: Secondary | ICD-10-CM | POA: Diagnosis not present

## 2022-01-05 DIAGNOSIS — Z87448 Personal history of other diseases of urinary system: Secondary | ICD-10-CM | POA: Diagnosis not present

## 2022-01-05 DIAGNOSIS — E785 Hyperlipidemia, unspecified: Secondary | ICD-10-CM | POA: Diagnosis present

## 2022-01-05 DIAGNOSIS — Z8249 Family history of ischemic heart disease and other diseases of the circulatory system: Secondary | ICD-10-CM

## 2022-01-05 DIAGNOSIS — F05 Delirium due to known physiological condition: Secondary | ICD-10-CM | POA: Diagnosis not present

## 2022-01-05 DIAGNOSIS — K219 Gastro-esophageal reflux disease without esophagitis: Secondary | ICD-10-CM | POA: Diagnosis present

## 2022-01-05 DIAGNOSIS — N319 Neuromuscular dysfunction of bladder, unspecified: Secondary | ICD-10-CM | POA: Diagnosis present

## 2022-01-05 DIAGNOSIS — F32A Depression, unspecified: Secondary | ICD-10-CM | POA: Diagnosis present

## 2022-01-05 DIAGNOSIS — N492 Inflammatory disorders of scrotum: Secondary | ICD-10-CM | POA: Diagnosis present

## 2022-01-05 DIAGNOSIS — S31501A Unspecified open wound of unspecified external genital organs, male, initial encounter: Secondary | ICD-10-CM | POA: Diagnosis not present

## 2022-01-05 DIAGNOSIS — E876 Hypokalemia: Secondary | ICD-10-CM | POA: Diagnosis not present

## 2022-01-05 DIAGNOSIS — E1140 Type 2 diabetes mellitus with diabetic neuropathy, unspecified: Secondary | ICD-10-CM | POA: Diagnosis present

## 2022-01-05 DIAGNOSIS — Z8673 Personal history of transient ischemic attack (TIA), and cerebral infarction without residual deficits: Secondary | ICD-10-CM

## 2022-01-05 DIAGNOSIS — E119 Type 2 diabetes mellitus without complications: Secondary | ICD-10-CM | POA: Diagnosis not present

## 2022-01-05 DIAGNOSIS — A419 Sepsis, unspecified organism: Secondary | ICD-10-CM

## 2022-01-05 DIAGNOSIS — N3289 Other specified disorders of bladder: Secondary | ICD-10-CM | POA: Diagnosis present

## 2022-01-05 DIAGNOSIS — N490 Inflammatory disorders of seminal vesicle: Secondary | ICD-10-CM | POA: Diagnosis not present

## 2022-01-05 DIAGNOSIS — Z794 Long term (current) use of insulin: Secondary | ICD-10-CM

## 2022-01-05 DIAGNOSIS — F4310 Post-traumatic stress disorder, unspecified: Secondary | ICD-10-CM | POA: Diagnosis present

## 2022-01-05 DIAGNOSIS — R252 Cramp and spasm: Secondary | ICD-10-CM | POA: Diagnosis present

## 2022-01-05 DIAGNOSIS — Z8719 Personal history of other diseases of the digestive system: Secondary | ICD-10-CM

## 2022-01-05 DIAGNOSIS — I129 Hypertensive chronic kidney disease with stage 1 through stage 4 chronic kidney disease, or unspecified chronic kidney disease: Secondary | ICD-10-CM | POA: Diagnosis present

## 2022-01-05 DIAGNOSIS — D638 Anemia in other chronic diseases classified elsewhere: Secondary | ICD-10-CM | POA: Diagnosis present

## 2022-01-05 DIAGNOSIS — R410 Disorientation, unspecified: Secondary | ICD-10-CM | POA: Diagnosis not present

## 2022-01-05 DIAGNOSIS — K651 Peritoneal abscess: Secondary | ICD-10-CM | POA: Diagnosis present

## 2022-01-05 DIAGNOSIS — Z7984 Long term (current) use of oral hypoglycemic drugs: Secondary | ICD-10-CM | POA: Diagnosis not present

## 2022-01-05 DIAGNOSIS — N1832 Chronic kidney disease, stage 3b: Secondary | ICD-10-CM | POA: Diagnosis present

## 2022-01-05 DIAGNOSIS — D649 Anemia, unspecified: Secondary | ICD-10-CM | POA: Diagnosis not present

## 2022-01-05 DIAGNOSIS — N179 Acute kidney failure, unspecified: Secondary | ICD-10-CM | POA: Diagnosis present

## 2022-01-05 DIAGNOSIS — Z888 Allergy status to other drugs, medicaments and biological substances status: Secondary | ICD-10-CM

## 2022-01-05 DIAGNOSIS — B9689 Other specified bacterial agents as the cause of diseases classified elsewhere: Secondary | ICD-10-CM | POA: Diagnosis not present

## 2022-01-05 DIAGNOSIS — N493 Fournier gangrene: Secondary | ICD-10-CM | POA: Diagnosis present

## 2022-01-05 DIAGNOSIS — Z87442 Personal history of urinary calculi: Secondary | ICD-10-CM

## 2022-01-05 DIAGNOSIS — D509 Iron deficiency anemia, unspecified: Secondary | ICD-10-CM | POA: Diagnosis present

## 2022-01-05 DIAGNOSIS — Z833 Family history of diabetes mellitus: Secondary | ICD-10-CM

## 2022-01-05 HISTORY — PX: IRRIGATION AND DEBRIDEMENT ABSCESS: SHX5252

## 2022-01-05 HISTORY — DX: Fournier gangrene: N49.3

## 2022-01-05 LAB — I-STAT CHEM 8, ED
BUN: 43 mg/dL — ABNORMAL HIGH (ref 8–23)
Calcium, Ion: 1.35 mmol/L (ref 1.15–1.40)
Chloride: 104 mmol/L (ref 98–111)
Creatinine, Ser: 1.9 mg/dL — ABNORMAL HIGH (ref 0.61–1.24)
Glucose, Bld: 160 mg/dL — ABNORMAL HIGH (ref 70–99)
HCT: 42 % (ref 39.0–52.0)
Hemoglobin: 14.3 g/dL (ref 13.0–17.0)
Potassium: 4.1 mmol/L (ref 3.5–5.1)
Sodium: 138 mmol/L (ref 135–145)
TCO2: 24 mmol/L (ref 22–32)

## 2022-01-05 LAB — POCT I-STAT, CHEM 8
BUN: 33 mg/dL — ABNORMAL HIGH (ref 8–23)
Calcium, Ion: 1.34 mmol/L (ref 1.15–1.40)
Chloride: 109 mmol/L (ref 98–111)
Creatinine, Ser: 1.1 mg/dL (ref 0.61–1.24)
Glucose, Bld: 126 mg/dL — ABNORMAL HIGH (ref 70–99)
HCT: 34 % — ABNORMAL LOW (ref 39.0–52.0)
Hemoglobin: 11.6 g/dL — ABNORMAL LOW (ref 13.0–17.0)
Potassium: 3.9 mmol/L (ref 3.5–5.1)
Sodium: 139 mmol/L (ref 135–145)
TCO2: 20 mmol/L — ABNORMAL LOW (ref 22–32)

## 2022-01-05 LAB — URINALYSIS, ROUTINE W REFLEX MICROSCOPIC
Bilirubin Urine: NEGATIVE
Glucose, UA: >=500 mg/dL — AB
Ketones, ur: NEGATIVE mg/dL
Nitrite: NEGATIVE
Protein, ur: 30 mg/dL — AB
Specific Gravity, Urine: 1.028 (ref 1.005–1.030)
WBC, UA: >50 WBC/hpf — ABNORMAL HIGH (ref 0–5)
pH: 5 (ref 5.0–8.0)

## 2022-01-05 LAB — COMPREHENSIVE METABOLIC PANEL
ALT: 12 U/L (ref 0–44)
AST: 16 U/L (ref 15–41)
Albumin: 2.6 g/dL — ABNORMAL LOW (ref 3.5–5.0)
Alkaline Phosphatase: 71 U/L (ref 38–126)
Anion gap: 14 (ref 5–15)
BUN: 46 mg/dL — ABNORMAL HIGH (ref 8–23)
CO2: 23 mmol/L (ref 22–32)
Calcium: 10.5 mg/dL — ABNORMAL HIGH (ref 8.9–10.3)
Chloride: 100 mmol/L (ref 98–111)
Creatinine, Ser: 1.86 mg/dL — ABNORMAL HIGH (ref 0.61–1.24)
GFR, Estimated: 38 mL/min — ABNORMAL LOW (ref >60)
Glucose, Bld: 164 mg/dL — ABNORMAL HIGH (ref 70–99)
Potassium: 4.1 mmol/L (ref 3.5–5.1)
Sodium: 137 mmol/L (ref 135–145)
Total Bilirubin: 0.6 mg/dL (ref 0.3–1.2)
Total Protein: 7.7 g/dL (ref 6.5–8.1)

## 2022-01-05 LAB — CBC WITH DIFFERENTIAL/PLATELET
Abs Immature Granulocytes: 0 10*3/uL (ref 0.00–0.07)
Basophils Absolute: 0 10*3/uL (ref 0.0–0.1)
Basophils Relative: 0 %
Eosinophils Absolute: 0 10*3/uL (ref 0.0–0.5)
Eosinophils Relative: 0 %
HCT: 40 % (ref 39.0–52.0)
Hemoglobin: 12.8 g/dL — ABNORMAL LOW (ref 13.0–17.0)
Lymphocytes Relative: 5 %
Lymphs Abs: 0.9 10*3/uL (ref 0.7–4.0)
MCH: 24.9 pg — ABNORMAL LOW (ref 26.0–34.0)
MCHC: 32 g/dL (ref 30.0–36.0)
MCV: 77.8 fL — ABNORMAL LOW (ref 80.0–100.0)
Monocytes Absolute: 0.9 10*3/uL (ref 0.1–1.0)
Monocytes Relative: 5 %
Neutro Abs: 16.2 10*3/uL — ABNORMAL HIGH (ref 1.7–7.7)
Neutrophils Relative %: 90 %
Platelets: 204 10*3/uL (ref 150–400)
RBC: 5.14 MIL/uL (ref 4.22–5.81)
RDW: 16.8 % — ABNORMAL HIGH (ref 11.5–15.5)
WBC: 18 10*3/uL — ABNORMAL HIGH (ref 4.0–10.5)
nRBC: 0 % (ref 0.0–0.2)
nRBC: 0 /100 WBC

## 2022-01-05 LAB — URINE CULTURE

## 2022-01-05 LAB — GLUCOSE, CAPILLARY
Glucose-Capillary: 144 mg/dL — ABNORMAL HIGH (ref 70–99)
Glucose-Capillary: 126 mg/dL — ABNORMAL HIGH (ref 70–99)
Glucose-Capillary: 121 mg/dL — ABNORMAL HIGH (ref 70–99)
Glucose-Capillary: 139 mg/dL — ABNORMAL HIGH (ref 70–99)
Glucose-Capillary: 146 mg/dL — ABNORMAL HIGH (ref 70–99)

## 2022-01-05 LAB — LACTIC ACID, PLASMA
Lactic Acid, Venous: 1.5 mmol/L (ref 0.5–1.9)
Lactic Acid, Venous: 1.2 mmol/L (ref 0.5–1.9)

## 2022-01-05 LAB — TYPE AND SCREEN
ABO/RH(D): A POS
Antibody Screen: NEGATIVE

## 2022-01-05 LAB — AMMONIA: Ammonia: 10 umol/L (ref 9–35)

## 2022-01-05 SURGERY — IRRIGATION AND DEBRIDEMENT ABSCESS
Anesthesia: General

## 2022-01-05 MED ORDER — ACETAMINOPHEN 500 MG PO TABS
1000.0000 mg | ORAL_TABLET | Freq: Once | ORAL | Status: AC
  Administered 2022-01-05: 1000 mg via ORAL
  Filled 2022-01-05: qty 2

## 2022-01-05 MED ORDER — LACTATED RINGERS IV SOLN: INTRAVENOUS | Status: DC

## 2022-01-05 MED ORDER — LIDOCAINE 2% (20 MG/ML) 5 ML SYRINGE
INTRAMUSCULAR | Status: AC
  Filled 2022-01-05: qty 10

## 2022-01-05 MED ORDER — PIPERACILLIN-TAZOBACTAM 3.375 G IVPB
3.3750 g | Freq: Three times a day (TID) | INTRAVENOUS | Status: DC
  Administered 2022-01-05 – 2022-01-07 (×5): 3.375 g via INTRAVENOUS
  Filled 2022-01-05 (×6): qty 50

## 2022-01-05 MED ORDER — SUCCINYLCHOLINE CHLORIDE 200 MG/10ML IV SOSY
PREFILLED_SYRINGE | INTRAVENOUS | Status: AC
  Filled 2022-01-05: qty 20

## 2022-01-05 MED ORDER — LACTATED RINGERS IV BOLUS (SEPSIS): 500.0000 mL | Freq: Once | INTRAVENOUS | Status: DC

## 2022-01-05 MED ORDER — INSULIN GLARGINE-YFGN 100 UNIT/ML ~~LOC~~ SOLN
5.0000 [IU] | Freq: Every day | SUBCUTANEOUS | Status: DC
  Administered 2022-01-05 – 2022-01-09 (×5): 5 [IU] via SUBCUTANEOUS
  Filled 2022-01-05 (×6): qty 0.05

## 2022-01-05 MED ORDER — SODIUM CHLORIDE 0.9 % IV SOLN
2.0000 g | Freq: Once | INTRAVENOUS | Status: AC
  Administered 2022-01-05: 2 g via INTRAVENOUS
  Filled 2022-01-05: qty 12.5

## 2022-01-05 MED ORDER — SUGAMMADEX SODIUM 200 MG/2ML IV SOLN
INTRAVENOUS | Status: DC | PRN
  Administered 2022-01-05: 308.4 mg via INTRAVENOUS

## 2022-01-05 MED ORDER — FENTANYL CITRATE (PF) 250 MCG/5ML IJ SOLN
INTRAMUSCULAR | Status: DC | PRN
  Administered 2022-01-05: 50 ug via INTRAVENOUS

## 2022-01-05 MED ORDER — FENTANYL CITRATE (PF) 100 MCG/2ML IJ SOLN
INTRAMUSCULAR | Status: AC
  Administered 2022-01-05: 25 ug via INTRAVENOUS
  Filled 2022-01-05: qty 2

## 2022-01-05 MED ORDER — LACTATED RINGERS IV BOLUS (SEPSIS)
1000.0000 mL | Freq: Once | INTRAVENOUS | Status: AC
  Administered 2022-01-05: 1000 mL via INTRAVENOUS

## 2022-01-05 MED ORDER — FENTANYL CITRATE (PF) 250 MCG/5ML IJ SOLN
INTRAMUSCULAR | Status: AC
  Filled 2022-01-05: qty 5

## 2022-01-05 MED ORDER — ALBUMIN HUMAN 5 % IV SOLN: INTRAVENOUS | Status: DC | PRN

## 2022-01-05 MED ORDER — ROCURONIUM BROMIDE 10 MG/ML (PF) SYRINGE
PREFILLED_SYRINGE | INTRAVENOUS | Status: AC
  Filled 2022-01-05: qty 20

## 2022-01-05 MED ORDER — LINEZOLID 600 MG/300ML IV SOLN
600.0000 mg | Freq: Two times a day (BID) | INTRAVENOUS | Status: DC
  Administered 2022-01-05 – 2022-01-08 (×6): 600 mg via INTRAVENOUS
  Filled 2022-01-05 (×9): qty 300

## 2022-01-05 MED ORDER — HYDROMORPHONE HCL 1 MG/ML IJ SOLN
INTRAMUSCULAR | Status: AC
  Filled 2022-01-05: qty 0.5

## 2022-01-05 MED ORDER — FENTANYL CITRATE PF 50 MCG/ML IJ SOSY
50.0000 ug | PREFILLED_SYRINGE | Freq: Once | INTRAMUSCULAR | Status: AC
  Administered 2022-01-05: 50 ug via INTRAVENOUS
  Filled 2022-01-05: qty 1

## 2022-01-05 MED ORDER — SODIUM CHLORIDE 0.9 % IV SOLN: 2.0000 g | Freq: Two times a day (BID) | INTRAVENOUS | Status: DC

## 2022-01-05 MED ORDER — FENTANYL CITRATE (PF) 100 MCG/2ML IJ SOLN: 25.0000 ug | INTRAMUSCULAR | Status: DC | PRN

## 2022-01-05 MED ORDER — OXYBUTYNIN CHLORIDE 5 MG PO TABS
7.5000 mg | ORAL_TABLET | Freq: Two times a day (BID) | ORAL | Status: DC
  Administered 2022-01-05 – 2022-01-24 (×36): 7.5 mg via ORAL
  Filled 2022-01-05 (×36): qty 2

## 2022-01-05 MED ORDER — INSULIN ASPART 100 UNIT/ML IJ SOLN: 0.0000 [IU] | INTRAMUSCULAR | Status: DC | PRN

## 2022-01-05 MED ORDER — ROCURONIUM BROMIDE 10 MG/ML (PF) SYRINGE
PREFILLED_SYRINGE | INTRAVENOUS | Status: DC | PRN
  Administered 2022-01-05: 30 mg via INTRAVENOUS
  Administered 2022-01-05: 50 mg via INTRAVENOUS

## 2022-01-05 MED ORDER — ORAL CARE MOUTH RINSE: 15.0000 mL | Freq: Once | OROMUCOSAL | Status: AC

## 2022-01-05 MED ORDER — PROPOFOL 10 MG/ML IV BOLUS
INTRAVENOUS | Status: AC
  Filled 2022-01-05: qty 20

## 2022-01-05 MED ORDER — PHENYLEPHRINE 80 MCG/ML (10ML) SYRINGE FOR IV PUSH (FOR BLOOD PRESSURE SUPPORT)
PREFILLED_SYRINGE | INTRAVENOUS | Status: DC | PRN
  Administered 2022-01-05: 120 ug via INTRAVENOUS
  Administered 2022-01-05 (×3): 80 ug via INTRAVENOUS

## 2022-01-05 MED ORDER — SODIUM CHLORIDE 0.9 % IV BOLUS
1000.0000 mL | Freq: Once | INTRAVENOUS | Status: AC
  Administered 2022-01-05: 1000 mL via INTRAVENOUS

## 2022-01-05 MED ORDER — METRONIDAZOLE 500 MG/100ML IV SOLN
500.0000 mg | Freq: Once | INTRAVENOUS | Status: AC
  Administered 2022-01-05: 500 mg via INTRAVENOUS
  Filled 2022-01-05: qty 100

## 2022-01-05 MED ORDER — LACTATED RINGERS IV BOLUS (SEPSIS): 1000.0000 mL | Freq: Once | INTRAVENOUS | Status: DC

## 2022-01-05 MED ORDER — LACTATED RINGERS IV SOLN: INTRAVENOUS | Status: AC

## 2022-01-05 MED ORDER — VANCOMYCIN HCL IN DEXTROSE 1-5 GM/200ML-% IV SOLN: 1000.0000 mg | INTRAVENOUS | Status: DC

## 2022-01-05 MED ORDER — ONDANSETRON HCL 4 MG/2ML IJ SOLN
INTRAMUSCULAR | Status: AC
  Filled 2022-01-05: qty 6

## 2022-01-05 MED ORDER — INSULIN ASPART 100 UNIT/ML IJ SOLN
0.0000 [IU] | Freq: Three times a day (TID) | INTRAMUSCULAR | Status: DC
  Administered 2022-01-05: 1 [IU] via SUBCUTANEOUS
  Administered 2022-01-06 – 2022-01-08 (×3): 2 [IU] via SUBCUTANEOUS
  Administered 2022-01-08 – 2022-01-11 (×5): 1 [IU] via SUBCUTANEOUS
  Administered 2022-01-11: 2 [IU] via SUBCUTANEOUS
  Administered 2022-01-12 (×2): 3 [IU] via SUBCUTANEOUS
  Administered 2022-01-12: 2 [IU] via SUBCUTANEOUS
  Administered 2022-01-13: 1 [IU] via SUBCUTANEOUS
  Administered 2022-01-13: 9 [IU] via SUBCUTANEOUS
  Administered 2022-01-14 – 2022-01-16 (×6): 2 [IU] via SUBCUTANEOUS
  Administered 2022-01-16 – 2022-01-17 (×4): 1 [IU] via SUBCUTANEOUS
  Administered 2022-01-18 – 2022-01-20 (×4): 2 [IU] via SUBCUTANEOUS
  Administered 2022-01-20: 3 [IU] via SUBCUTANEOUS
  Administered 2022-01-20: 1 [IU] via SUBCUTANEOUS
  Administered 2022-01-21: 7 [IU] via SUBCUTANEOUS
  Administered 2022-01-21: 2 [IU] via SUBCUTANEOUS
  Administered 2022-01-21: 1 [IU] via SUBCUTANEOUS
  Administered 2022-01-22: 3 [IU] via SUBCUTANEOUS
  Administered 2022-01-22 – 2022-01-23 (×3): 2 [IU] via SUBCUTANEOUS
  Administered 2022-01-23: 1 [IU] via SUBCUTANEOUS
  Administered 2022-01-24: 3 [IU] via SUBCUTANEOUS

## 2022-01-05 MED ORDER — ONDANSETRON HCL 4 MG/2ML IJ SOLN
INTRAMUSCULAR | Status: DC | PRN
  Administered 2022-01-05: 4 mg via INTRAVENOUS

## 2022-01-05 MED ORDER — DANTROLENE SODIUM 100 MG PO CAPS
100.0000 mg | ORAL_CAPSULE | Freq: Three times a day (TID) | ORAL | Status: DC
  Administered 2022-01-06 – 2022-01-10 (×11): 100 mg via ORAL
  Filled 2022-01-05 (×16): qty 1

## 2022-01-05 MED ORDER — OXYCODONE HCL 5 MG PO TABS: 5.0000 mg | ORAL_TABLET | Freq: Once | ORAL | Status: DC | PRN

## 2022-01-05 MED ORDER — PROPOFOL 10 MG/ML IV BOLUS
INTRAVENOUS | Status: DC | PRN
  Administered 2022-01-05: 140 mg via INTRAVENOUS

## 2022-01-05 MED ORDER — ACETAMINOPHEN 500 MG PO TABS
1000.0000 mg | ORAL_TABLET | Freq: Four times a day (QID) | ORAL | Status: DC | PRN
  Administered 2022-01-05: 1000 mg via ORAL
  Filled 2022-01-05: qty 2

## 2022-01-05 MED ORDER — LIDOCAINE 2% (20 MG/ML) 5 ML SYRINGE
INTRAMUSCULAR | Status: DC | PRN
  Administered 2022-01-05: 100 mg via INTRAVENOUS

## 2022-01-05 MED ORDER — CHLORHEXIDINE GLUCONATE 0.12 % MT SOLN: 15.0000 mL | Freq: Once | OROMUCOSAL | Status: AC

## 2022-01-05 MED ORDER — SERTRALINE HCL 100 MG PO TABS
100.0000 mg | ORAL_TABLET | Freq: Every day | ORAL | Status: DC
  Administered 2022-01-06 – 2022-01-24 (×17): 100 mg via ORAL
  Filled 2022-01-05 (×17): qty 1

## 2022-01-05 MED ORDER — CHLORHEXIDINE GLUCONATE 0.12 % MT SOLN
OROMUCOSAL | Status: AC
  Administered 2022-01-05: 15 mL via OROMUCOSAL
  Filled 2022-01-05: qty 15

## 2022-01-05 MED ORDER — PHENYLEPHRINE HCL-NACL 20-0.9 MG/250ML-% IV SOLN
INTRAVENOUS | Status: DC | PRN
  Administered 2022-01-05: 25 ug/min via INTRAVENOUS

## 2022-01-05 MED ORDER — VANCOMYCIN HCL IN DEXTROSE 1-5 GM/200ML-% IV SOLN: 1000.0000 mg | Freq: Once | INTRAVENOUS | Status: DC

## 2022-01-05 MED ORDER — DEXAMETHASONE SODIUM PHOSPHATE 10 MG/ML IJ SOLN
INTRAMUSCULAR | Status: AC
  Filled 2022-01-05: qty 3

## 2022-01-05 MED ORDER — IOHEXOL 350 MG/ML SOLN
75.0000 mL | Freq: Once | INTRAVENOUS | Status: AC | PRN
  Administered 2022-01-05: 75 mL via INTRAVENOUS

## 2022-01-05 MED ORDER — 0.9 % SODIUM CHLORIDE (POUR BTL) OPTIME
TOPICAL | Status: DC | PRN
  Administered 2022-01-05: 500 mL

## 2022-01-05 MED ORDER — DEXAMETHASONE SODIUM PHOSPHATE 10 MG/ML IJ SOLN
INTRAMUSCULAR | Status: DC | PRN
  Administered 2022-01-05: 5 mg via INTRAVENOUS

## 2022-01-05 MED ORDER — VANCOMYCIN HCL 2000 MG/400ML IV SOLN
2000.0000 mg | Freq: Once | INTRAVENOUS | Status: AC
  Administered 2022-01-05: 2000 mg via INTRAVENOUS
  Filled 2022-01-05: qty 400

## 2022-01-05 MED ORDER — VANCOMYCIN VARIABLE DOSE PER UNSTABLE RENAL FUNCTION (PHARMACIST DOSING): Status: DC

## 2022-01-05 MED ORDER — OXYCODONE HCL 5 MG/5ML PO SOLN: 5.0000 mg | Freq: Once | ORAL | Status: DC | PRN

## 2022-01-05 SURGICAL SUPPLY — 40 items
BAG COUNTER SPONGE SURGICOUNT (BAG) ×1 IMPLANT
BAG SPNG CNTER NS LX DISP (BAG) ×1
BAG URO CATCHER STRL LF (MISCELLANEOUS) IMPLANT
BLADE SURG 15 STRL LF DISP TIS (BLADE) ×2 IMPLANT
BLADE SURG 15 STRL SS (BLADE) ×1
BNDG GAUZE DERMACEA FLUFF 4 (GAUZE/BANDAGES/DRESSINGS) IMPLANT
BNDG GZE DERMACEA 4 6PLY (GAUZE/BANDAGES/DRESSINGS) ×3
CANISTER SUCT 3000ML PPV (MISCELLANEOUS) ×1 IMPLANT
CNTNR URN SCR LID CUP LEK RST (MISCELLANEOUS) ×1 IMPLANT
CONT SPEC 4OZ STRL OR WHT (MISCELLANEOUS)
DRAPE LAPAROTOMY T 102X78X121 (DRAPES) ×1 IMPLANT
ELECT REM PT RETURN 9FT ADLT (ELECTROSURGICAL) ×1
ELECTRODE REM PT RTRN 9FT ADLT (ELECTROSURGICAL) ×1 IMPLANT
GAUZE PAD ABD 8X10 STRL (GAUZE/BANDAGES/DRESSINGS) ×2 IMPLANT
GAUZE SPONGE 4X4 12PLY STRL (GAUZE/BANDAGES/DRESSINGS) ×1 IMPLANT
GAUZE SPONGE 4X4 12PLY STRL LF (GAUZE/BANDAGES/DRESSINGS) IMPLANT
GLOVE BIO SURGEON STRL SZ7 (GLOVE) IMPLANT
GLOVE BIOGEL PI IND STRL 6 (GLOVE) IMPLANT
GLOVE SURG ORTHO 8.0 STRL STRW (GLOVE) ×1 IMPLANT
GOWN STRL REUS W/ TWL LRG LVL3 (GOWN DISPOSABLE) IMPLANT
GOWN STRL REUS W/TWL LRG LVL3 (GOWN DISPOSABLE) ×1
KIT BASIN OR (CUSTOM PROCEDURE TRAY) ×1 IMPLANT
KIT TURNOVER KIT B (KITS) ×1 IMPLANT
NS IRRIG 1000ML POUR BTL (IV SOLUTION) ×1 IMPLANT
NS IRRIG 500ML POUR BTL (IV SOLUTION) IMPLANT
PACK GENERAL/GYN (CUSTOM PROCEDURE TRAY) ×1 IMPLANT
PAD ABD 7.5X8 STRL (GAUZE/BANDAGES/DRESSINGS) IMPLANT
PAD ARMBOARD 7.5X6 YLW CONV (MISCELLANEOUS) ×2 IMPLANT
SOL PREP POV-IOD 4OZ 10% (MISCELLANEOUS) IMPLANT
SPONGE T-LAP 18X18 ~~LOC~~+RFID (SPONGE) IMPLANT
STAPLER VISISTAT 35W (STAPLE) IMPLANT
SUPPORT SCROTAL MEDIUM (SOFTGOODS) IMPLANT
SUPPORT SCROTAL SMALL (MISCELLANEOUS) IMPLANT
SUT CHROMIC 3 0 SH 27 (SUTURE) ×1 IMPLANT
SUT PROLENE 4 0 PS 2 18 (SUTURE) ×1 IMPLANT
SUT VICRYL 4-0 PS2 18IN ABS (SUTURE) ×1 IMPLANT
SWAB COLLECTION DEVICE MRSA (MISCELLANEOUS) ×1 IMPLANT
TOWEL GREEN STERILE (TOWEL DISPOSABLE) ×1 IMPLANT
TRAY FOLEY MTR SLVR 16FR STAT (SET/KITS/TRAYS/PACK) IMPLANT
WATER STERILE IRR 1000ML POUR (IV SOLUTION) ×1 IMPLANT

## 2022-01-05 NOTE — Anesthesia Procedure Notes (Signed)
Procedure Name: Intubation Date/Time: 01/05/2022 2:39 PM  Performed by: Amadeo Garnet, CRNAPre-anesthesia Checklist: Patient identified, Emergency Drugs available, Suction available and Patient being monitored Patient Re-evaluated:Patient Re-evaluated prior to induction Oxygen Delivery Method: Circle system utilized Preoxygenation: Pre-oxygenation with 100% oxygen Induction Type: IV induction Ventilation: Mask ventilation without difficulty Laryngoscope Size: Mac and 4 Grade View: Grade I Tube type: Oral Tube size: 7.5 mm Number of attempts: 1 Airway Equipment and Method: Stylet and Oral airway Placement Confirmation: ETT inserted through vocal cords under direct vision, positive ETCO2 and breath sounds checked- equal and bilateral Secured at: 22 cm Tube secured with: Tape Dental Injury: Teeth and Oropharynx as per pre-operative assessment

## 2022-01-05 NOTE — ED Triage Notes (Signed)
Patient arrived to the ED via EMS. Patient was recently here for a UTI. EMS reports that wife says he is still confused. Wife told EMS that she wants an MRI. Patient states that his left testicle has been swollen and that he has noticed blood. Patient was given 150 mL of NS via EMS. Patient reports pain 10/10. Patient states having chills.

## 2022-01-05 NOTE — Op Note (Signed)
Preoperative diagnosis:  1. Fournier's gangrene 2. History of Neurogenic Bladder 3. DM   Postoperative diagnosis: same  Procedure(s): 1. Debridement of Fournier's gangrene  Surgeon: Dr. Donald Pore  Anesthesia: general  Complications: none  EBL: 200 cc  Drain 16 fr foley catheter with 10 cc in balloon  Specimens: culture  Intraoperative findings: infection and necrotic tissue tracked up to the left inguinal region. General surgery consulted intra op; see their associated documentation  Description of procedure:  With appropriate consent having been obtained the patient was brought to the operative suite. Anesthesia was induced and the patient was prepped and draped in the usual sterile fashion.  A timeout was performed.  A 16 French Foley was easily placed and the balloon inflated with 10 cc.  I began by making an incision down the median raphae of the scrotum.  There is immediately purulent material evacuated.  Cultures were obtained.  I debrided the necrotic tissue and scrotum.  At this time both testicles and their tunica's seem viable and so were left intact.  I ended up extending the incision to the left subinguinal region.  Necrotic tissue in the superior scrotum and tracking along the spermatic cord was debrided.  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  Please see their op note for further details.  Ultimately the incision was opened up through the inguinal region and further necrotic debris was removed.  After an adequate debridement hemostasis was achieved with the aid of the Bovie.  The wound was then packed with wet Kerlix.  Plan  1.  Twice daily changes of the wound packing.  2. Plan to take the patient back to the operating room on Sunday for a second look as I anticipate this to be necessary  Donald Pore MD 01/05/2022, [facility]  Pager:  [phone]

## 2022-01-05 NOTE — ED Provider Notes (Signed)
[facility] EMERGENCY DEPARTMENT Provider Note   CSN: [id number] Arrival date & time: 01/05/22  0825     History  Chief Complaint  Patient presents with   Altered Mental Status    Timothy Davenport is a 71 y.o. male.  The history is provided by the patient and medical records. No language interpreter was used.  Altered Mental Status Presenting symptoms: confusion (per EMS)   Presenting symptoms comment:  Somnolence present Severity:  Severe Most recent episode:  2 days ago Episode history:  Continuous Timing:  Constant Progression:  Waxing and waning Chronicity:  New Context: recent infection   Associated symptoms: abdominal pain and fever (per pt subjective)   Associated symptoms: no agitation, no hallucinations, no headaches, no light-headedness, no nausea, no palpitations, no vomiting and no weakness        Home Medications Prior to Admission medications   Medication Sig Start Date End Date Taking? Authorizing Provider  acetaminophen (TYLENOL) 325 MG tablet Take 1-2 tablets (325-650 mg total) by mouth every 4 (four) hours as needed for mild pain. 07/20/19   Love, Ivan Anchors, PA-C  Alogliptin Benzoate 25 MG TABS Take 25 tablets by mouth daily with breakfast.    [provider]  ammonium lactate (AMLACTIN) 12 % cream Apply 1 application. topically 2 (two) times daily as needed for dry skin. 01/11/20   [provider]  aspirin EC 81 MG tablet Take 1 tablet (81 mg total) by mouth daily. Swallow whole. 04/30/21   Elgergawy, Silver Huguenin, MD  baclofen (LIORESAL) 20 MG tablet Take 1 tablet by mouth 3 (three) times daily. 01/11/20   [provider]  bisacodyl (DULCOLAX) 10 MG suppository UNWRAP AND INSERT 1 SUPPOSITORY(10 MG) RECTALLY DAILY AFTER SUPPER Patient taking differently: Place 10 mg rectally daily as needed for mild constipation. UNWRAP AND INSERT 1 SUPPOSITORY(10 MG) RECTALLY DAILY AFTER SUPPER 08/21/19   Raulkar, Clide Deutscher, MD   cefadroxil (DURICEF) 500 MG capsule Take 1 capsule (500 mg total) by mouth 2 (two) times daily. 01/03/22   Prosperi, Christian H, PA-C  Cholecalciferol 100 MCG (4000 UT) TABS Take 4,000 Units by mouth daily. 01/10/21   [provider]  dantrolene (DANTRIUM) 100 MG capsule Take 1 capsule (100 mg total) by mouth 3 (three) times daily. 03/08/21   Lovorn, Jinny Blossom, MD  empagliflozin (JARDIANCE) 25 MG TABS tablet Take 12.5 mg by mouth daily. 10/20/20   [provider]  fluconazole (DIFLUCAN) 150 MG tablet Take 1 tablet (150 mg total) by mouth daily for 2 doses. Take 1 dose immediately, and another dose in 7 days if you are having any ongoing yeast infection symptoms 01/03/22 01/05/22  Prosperi, Christian H, PA-C  fluticasone (FLONASE) 50 MCG/ACT nasal spray Place 2 sprays into both nostrils daily. 02/10/20   [provider]  gabapentin (NEURONTIN) 400 MG capsule Take 800 mg by mouth 3 (three) times daily. 01/28/20   [provider]  glucose 4 GM chewable tablet Chew 4 tablets by mouth as needed for low blood sugar.    [provider]  insulin aspart (NOVOLOG) 100 UNIT/ML FlexPen Inject 8 Units into the skin at bedtime. 06/14/20   [provider]  lidocaine (XYLOCAINE) 2 % jelly Apply 1 application topically at bedtime as needed (apply to rectum with digital stim).    [provider]  oxybutynin (DITROPAN) 5 MG tablet Take 7.5 mg by mouth 2 (two) times daily.    [provider]  pioglitazone (ACTOS) 30 MG  tablet Take 30 mg by mouth daily.    [provider]  Polyethylene Glycol 3350 POWD Take 1 Dose by mouth daily as needed (constipation). Take one capful by mouth every day in 8 ounces of liquid as needed    [provider]  polyvinyl alcohol (LIQUIFILM TEARS) 1.4 % ophthalmic solution Place 1 drop into both eyes 4 (four) times daily. 07/20/19   Love, Ivan Anchors, PA-C  senna-docusate (SENOKOT-S) 8.6-50 MG tablet Take 2 tablets  by mouth daily. 10/20/20   [provider]  sertraline (ZOLOFT) 100 MG tablet Take 100 mg by mouth daily.    [provider]  sildenafil (VIAGRA) 100 MG tablet Take 100 mg by mouth daily as needed for erectile dysfunction. Take one hour prior to sexual activity **Do NOT exceed 1 dose per 24 hour period**    [provider]  simvastatin (ZOCOR) 20 MG tablet Take 20 mg by mouth daily at 6 PM.    [provider]      Allergies    Lisinopril and Enalapril-hctz [enalapril-hydrochlorothiazide]    Review of Systems   Review of Systems  Constitutional:  Positive for chills, fatigue and fever (per pt subjective). Negative for diaphoresis.  HENT:  Negative for congestion.   Respiratory:  Negative for cough, chest tightness, shortness of breath and wheezing.   Cardiovascular:  Negative for chest pain and palpitations.  Gastrointestinal:  Positive for abdominal pain. Negative for nausea and vomiting.  Genitourinary:  Positive for difficulty urinating, dysuria, flank pain, penile pain, penile swelling and scrotal swelling. Negative for frequency.  Musculoskeletal:  Negative for back pain, neck pain and neck stiffness.  Skin:  Positive for wound.  Neurological:  Negative for syncope, weakness, light-headedness, numbness and headaches.  Psychiatric/Behavioral:  Positive for confusion (per EMS). Negative for agitation and hallucinations.   All other systems reviewed and are negative.   Physical Exam Updated Vital Signs BP 102/76 (BP Location: Right Arm)   Pulse 66   Temp 98.6 F (37 C) (Oral)   Resp 18   Ht '5\' 11"'$  (1.803 m)   Wt 81.6 kg   SpO2 99%   BMI 25.10 kg/m  Physical Exam Vitals and nursing note reviewed. Exam conducted with a chaperone present.  Constitutional:      General: He is not in acute distress.    Appearance: He is well-developed. He is ill-appearing. He is not toxic-appearing or diaphoretic.  HENT:     Head: Normocephalic and atraumatic.      Nose: No congestion or rhinorrhea.     Mouth/Throat:     Mouth: Mucous membranes are moist.     Pharynx: No oropharyngeal exudate or posterior oropharyngeal erythema.  Eyes:     Extraocular Movements: Extraocular movements intact.     Conjunctiva/sclera: Conjunctivae normal.     Pupils: Pupils are equal, round, and reactive to light.  Cardiovascular:     Rate and Rhythm: Regular rhythm. Bradycardia present.     Heart sounds: No murmur heard. Pulmonary:     Effort: Pulmonary effort is normal. No respiratory distress.     Breath sounds: Normal breath sounds. No wheezing, rhonchi or rales.  Chest:     Chest wall: No tenderness.  Abdominal:     General: Abdomen is flat.     Palpations: Abdomen is soft.     Tenderness: There is abdominal tenderness. There is no right CVA tenderness, left CVA tenderness, guarding or rebound.  Genitourinary:    Penis: Erythema and tenderness  present.      Testes:        Right: Tenderness present.       Comments: Patient has purulence coming out of an area on his central scrotum and has tenderness and erythema going proximally into the groin and suprapubic area worse on the left side.  He also swelling and tenderness of his penis and tenderness across his lower abdomen. Musculoskeletal:        General: Tenderness present. No swelling.     Cervical back: Neck supple. No tenderness.  Skin:    General: Skin is warm and dry.     Capillary Refill: Capillary refill takes less than 2 seconds.     Findings: Erythema present.  Neurological:     Mental Status: He is alert. Mental status is at baseline.  Psychiatric:        Mood and Affect: Mood normal.     ED Results / Procedures / Treatments   Labs (all labs ordered are listed, but only abnormal results are displayed) Labs Reviewed  CBC WITH DIFFERENTIAL/PLATELET - Abnormal; Notable for the following components:      Result Value   WBC 18.0 (*)    Hemoglobin 12.8 (*)    MCV 77.8 (*)    MCH 24.9  (*)    RDW 16.8 (*)    Neutro Abs 16.2 (*)    All other components within normal limits  COMPREHENSIVE METABOLIC PANEL - Abnormal; Notable for the following components:   Glucose, Bld 164 (*)    BUN 46 (*)    Creatinine, Ser 1.86 (*)    Calcium 10.5 (*)    Albumin 2.6 (*)    GFR, Estimated 38 (*)    All other components within normal limits  I-STAT CHEM 8, ED - Abnormal; Notable for the following components:   BUN 43 (*)    Creatinine, Ser 1.90 (*)    Glucose, Bld 160 (*)    All other components within normal limits  CULTURE, BLOOD (ROUTINE X 2)  CULTURE, BLOOD (ROUTINE X 2)  URINE CULTURE  LACTIC ACID, PLASMA  AMMONIA  LACTIC ACID, PLASMA  URINALYSIS, ROUTINE W REFLEX MICROSCOPIC  HEMOGLOBIN A1C    EKG None  Radiology CT ABDOMEN PELVIS W CONTRAST  Result Date: 01/05/2022 CLINICAL DATA:  Scrotal pain with purulent drainage. Concern for Fournier's gangrene. EXAM: CT ABDOMEN AND PELVIS WITH CONTRAST TECHNIQUE: Multidetector CT imaging of the abdomen and pelvis was performed using the standard protocol following bolus administration of intravenous contrast. RADIATION DOSE REDUCTION: This exam was performed according to the departmental dose-optimization program which includes automated exposure control, adjustment of the mA and/or kV according to patient size and/or use of iterative reconstruction technique. CONTRAST:  75m OMNIPAQUE IOHEXOL 350 MG/ML SOLN COMPARISON:  Scrotal ultrasound 2 days prior, CT abdomen/pelvis 04/24/2021 FINDINGS: Lower chest: The lung bases are clear. Coronary artery calcifications and aortic valve calcifications are noted. Hepatobiliary: Numerous hypodense lesions throughout the liver are similar to the prior study, likely benign cysts for which no specific imaging follow-up is required. The gallbladder is unremarkable. There is no biliary ductal dilatation. Pancreas: Unremarkable. Spleen: Unremarkable. Adrenals/Urinary Tract: The adrenals are unremarkable.  There is a 7 mm nonobstructing right lower pole stone. Small hypodense lesions in the kidneys most likely reflect benign cysts for which no specific imaging follow-up is required. There is no hydronephrosis or hydroureter. There is some excretion of contrast into the collecting systems on the delayed images. There is a small amount of  gas in the bladder. Stomach/Bowel: The stomach is unremarkable. There is no evidence of bowel obstruction. There is no abnormal bowel wall thickening or inflammatory change. There is colonic diverticulosis without evidence of acute diverticulitis. The appendix is normal. Vascular/Lymphatic: There is calcified plaque throughout the nonaneurysmal abdominal aorta. The major branch vessels are patent. The main portal and splenic veins are patent. There is no abdominal or pelvic lymphadenopathy. Reproductive: There is a 3.8 cm x 2.9 cm by 4.4 cm peripherally enhancing collection in the region of the right seminal vesicle superior to the prostate. There may also be a 1.0 cm abscess in the right aspect of the prostate (3-89). There is extensive gas tracking from the scrotum on the left into the soft tissues of the lower anterior pelvic wall. There is coarse calcification in the right scrotum. Other: There is no ascites or free intraperitoneal air. Musculoskeletal: There is no acute osseous abnormality or suspicious osseous lesion. IMPRESSION: 1. Extensive gas tracking from the scrotum on the left into the soft tissues of the lower anterior pelvic wall highly concerning for necrotizing infection/Fournier's gangrene. 2. 3.8 cm x 2.9 cm x 4.4 cm peripherally enhancing collection in the region of the right seminal vesicle superior to the prostate concerning for abscess, and additional possible small 1.0 cm prostatic abscess. 3. Small amount of gas in the bladder may be related to recent instrumentation or spread of infection. 4. 7 mm nonobstructing right lower pole renal stone. Critical  Value/emergent results were called by telephone at the time of interpretation on 01/05/2022 at 10:06 am to provider [facility] , who verbally acknowledged these results. Electronically Signed   By: Valetta Mole M.D.   On: 01/05/2022 10:11   DG Chest Portable 1 View  Result Date: 01/05/2022 CLINICAL DATA:  Altered mental status EXAM: PORTABLE CHEST 1 VIEW COMPARISON:  CXR 07/09/19 FINDINGS: No pleural effusion. No pneumothorax. No displaced rib fracture. Normal cardiac and mediastinal contours. Visualized upper abdomen is unremarkable. Degenerative changes of the right AC and glenohumeral joint. IMPRESSION: No focal airspace opacity. Electronically Signed   By: Marin Roberts M.D.   On: 01/05/2022 08:50   CT Head Wo Contrast  Result Date: 01/03/2022 CLINICAL DATA:  Neurological deficit, nausea EXAM: CT HEAD WITHOUT CONTRAST TECHNIQUE: Contiguous axial images were obtained from the base of the skull through the vertex without intravenous contrast. RADIATION DOSE REDUCTION: This exam was performed according to the departmental dose-optimization program which includes automated exposure control, adjustment of the mA and/or kV according to patient size and/or use of iterative reconstruction technique. COMPARISON:  07/22/2019 FINDINGS: Brain: No acute intracranial findings are seen. There are no signs of bleeding within the cranium. Minimal calcifications are seen in basal ganglia. Ventricles are not dilated. Cortical sulci are prominent. There is 8 mm extra-axial calcification inseparable from the meninges in the left frontoparietal region. There is no adjacent edema or mass effect. This finding appears stable. Vascular: Unremarkable. Skull: No fracture is seen. Sinuses/Orbits: Unremarkable. Other: There is increased amount of CSF in the sella suggesting partial empty sella. IMPRESSION: No acute intracranial findings are seen in noncontrast CT brain. Atrophy. There is 8 mm coarse extra-axial  calcification in the left frontoparietal region close to midline with no interval change suggesting dystrophic dural calcification or small calcified meningioma with no significant interval change. Electronically Signed   By: Elmer Picker M.D.   On: 01/03/2022 20:04    Procedures Procedures    CRITICAL CARE Performed by: Gwenyth Allegra Abdirahman Chittum Total  critical care time: 60 minutes Critical care time was exclusive of separately billable procedures and treating other patients. Critical care was necessary to treat or prevent imminent or life-threatening deterioration. Critical care was time spent personally by me on the following activities: development of treatment plan with patient and/or surrogate as well as nursing, discussions with consultants, evaluation of patient's response to treatment, examination of patient, obtaining history from patient or surrogate, ordering and performing treatments and interventions, ordering and review of laboratory studies, ordering and review of radiographic studies, pulse oximetry and re-evaluation of patient's condition.   Medications Ordered in ED Medications  lactated ringers infusion ( Intravenous New Bag/Given 01/05/22 1051)  lactated ringers bolus 1,000 mL (0 mLs Intravenous Stopped 01/05/22 1044)    And  lactated ringers bolus 1,000 mL (has no administration in time range)    And  lactated ringers bolus 500 mL (has no administration in time range)  vancomycin (VANCOREADY) IVPB 2000 mg/400 mL (2,000 mg Intravenous New Bag/Given 01/05/22 1056)  ceFEPIme (MAXIPIME) 2 g in sodium chloride 0.9 % 100 mL IVPB (has no administration in time range)  vancomycin variable dose per unstable renal function (pharmacist dosing) (has no administration in time range)  sodium chloride 0.9 % bolus 1,000 mL (0 mLs Intravenous Stopped 01/05/22 0951)  fentaNYL (SUBLIMAZE) injection 50 mcg (50 mcg Intravenous Given 01/05/22 0908)  ceFEPIme (MAXIPIME) 2 g in sodium  chloride 0.9 % 100 mL IVPB (0 g Intravenous Stopped 01/05/22 1038)  metroNIDAZOLE (FLAGYL) IVPB 500 mg (0 mg Intravenous Stopped 01/05/22 1103)  iohexol (OMNIPAQUE) 350 MG/ML injection 75 mL (75 mLs Intravenous Contrast Given 01/05/22 3875)    ED Course/ Medical Decision Making/ A&P                           Medical Decision Making Amount and/or Complexity of Data Reviewed Labs: ordered. Radiology: ordered.  Risk Prescription drug management. Decision regarding hospitalization.    JVEON POUND is a 71 y.o. 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  According to EMS, recently seen and found to have UTI and has been on antibiotics.  He has started having drainage coming from his scrotum that appears bloody in purulent.  Patient was found to be hypotensive with pressure in the 80s with EMS but after starting normal saline has improved.  He describes he is having chills but has not had fever at home.  He is describing the pain in his scrotum going towards the suprapubic area and into his abdomen.  He said he has not straight cathed his urine for the last 2 days because of the severe pain in his scrotum and testicles and penis.  He does not report any bowel changes.  He reports no headache neck pain chest pain, or upper back pain.  He does report pain in his low back.  He denies nausea, vomiting, congestion, or cough.  He agrees he is feeling fatigued but denies confusion.  Wife reportedly told EMS that she is concerned about his mental status.  On my initial evaluation, blood pressures around 643 systolic.  He is warm to the touch but oral temperature is normal.  We  will get rectal temp.  He is not tachycardic or tachypneic.  With a chaperone, his scrotum was examined and he does have purulence pouring out of the central part of the scrotum.  He also has swelling, erythema, and tenderness to his scrotum, suprapubic area, and groin.  He also has tenderness in his lower abdomen and bowel sounds were appreciated.  His low back was also tender primary on the left side than the right.  Lungs were clear and chest was nontender.  He was following all commands and moving both legs where he has intact sensation.  He reports his leg strength and sensation is at his baseline.  He had intact grip strength and sensation in both arms and had good pulses.  His speech is clear but he is somnolent and I need to yell with him to wake him up.  Otherwise exam unremarkable.  I reviewed his chart and it does appear that his ultrasound from several days ago showed concern for possible hematoma versus early abscess in the scrotum.  With the purulence I am seeing on exam I am somewhat concerned that this was more of an infection that is now spreading.  Due to his somnolence, soft pressures with EMS, I am somewhat concerning to rule out acute Fournier's gangrene development so we will get CT abdomen pelvis, screening labs, cultures, and further work-up.  There is no reported trauma and he is denying headache neck pain.  Doubt meningitis at this time.  I also suspect he will have an enlarged bladder as he reports he has not urinated in 2 days as he has not done a straight cath due to pain.  We will get bladder scan.  Anticipate admission after work-up is completed.  9:01 AM Nursing reports there is only 240 cc of urine in the bladder, will hold on catheter placement until imaging is completed.  He had no evidence of pneumonia on x-ray and he is still mentating well after I wake him up but he is still somnolent overall.  Pressure remains over 458 systolic.  I spoke to imaging team to expedite his  CT once his labs are completed.  10:13 AM I viewed the CT images myself and I am concerned about subcutaneous gas and Fournier's gangrene being present.  I called radiology and spoke to radiologist myself and they agree with the concern for developing Fournier's gangrene as well as a seminal vesicle abscess.  Patient is already getting antibiotics and vital signs are reassuring with no hypotension at this time.  We will call urology emergently and he will need admission.  Wife is at the bedside and is aware of all work-up thus far and agrees with plan.  He will remain n.p.o.  10:32 AM Spoke to urology who agrees patient is to go to the OR.  After they reviewed the images they do feel general surgery should be involved as the infection appears to be tracking up past the groin into the abdominal area.  We will call general surgery.  Urology will arrange him going to the OR and they request patient be admitted after.  General surgery will also come see patient.  We will call medicine for admission at the request of urology given diabetes and AKI.  Spoke to medicine teaching service who will admit.  Patient went to the OR for further surgical management.        Final Clinical Impression(s) / ED Diagnoses Final diagnoses:  Fournier gangrene     Clinical  Impression: 1. Fournier gangrene     Disposition: Admit  This note was prepared with assistance of Systems analyst. Occasional wrong-word or sound-a-like substitutions may have occurred due to the inherent limitations of voice recognition software.     Harm Jou, Gwenyth Allegra, MD 01/05/22 1140

## 2022-01-05 NOTE — Anesthesia Postprocedure Evaluation (Signed)
Anesthesia Post Note  Patient: Timothy Davenport  Procedure(s) Performed: IRRIGATION AND DEBRIDEMENT NECROTIZING  TISSUE RECTAL ABSCESS     Patient location during evaluation: PACU Anesthesia Type: General Level of consciousness: sedated Pain management: pain level controlled Vital Signs Assessment: post-procedure vital signs reviewed and stable Respiratory status: spontaneous breathing and respiratory function stable Cardiovascular status: stable Postop Assessment: no apparent nausea or vomiting Anesthetic complications: no   No notable events documented.  Last Vitals:  Vitals:   01/05/22 1705 01/05/22 1715  BP: 115/73 101/68  Pulse: 73 70  Resp: 18 (!) 21  Temp:  (!) 36.2 C  SpO2: 94% 100%    Last Pain:  Vitals:   01/05/22 1715  TempSrc:   PainSc: [place]

## 2022-01-05 NOTE — Progress Notes (Addendum)
Pharmacy Antibiotic Note  Timothy Davenport is a 71 y.o. 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  Pharmacy has been consulted for vancomycin/cefepime dosing. Noted AKI - SCr 1.9 on admit (baseline ~1.1-1.4).  Plan: Cefepime 2g IV x 1; then 2g IV q12h Vancomycin 2g IV x 1; then f/u renal function trend 11/18, may consider level prior to re-dosing in paraplegic patient with AKI if SCr trends up further Flagyl '500mg'$  IV x 1 per EDP - f/u if MD plans to continue Monitor clinical progress, c/s, renal function F/u de-escalation plan/LOT, vancomycin levels as indicated   Height: '5\' 11"'$  (180.3 cm) Weight: 81.6 kg (180 lb) IBW/kg (Calculated) : 75.3  Temp (24hrs), Avg:98.6 F (37 C), Min:98.6 F (37 C), Max:98.6 F (37 C)  Recent Labs  Lab 01/03/22 0941 01/05/22 0900 01/05/22 0920  WBC 11.1* 18.0*  --   CREATININE 1.26*  --  1.90*    Estimated Creatinine Clearance: 36.9 mL/min (A) (by C-G formula based on SCr of 1.9 mg/dL (H)).    Allergies  Allergen Reactions   Lisinopril Other (See Comments)    Angioedema   Enalapril-Hctz [Enalapril-Hydrochlorothiazide] Rash    Antimicrobials this admission: 11/17 vancomycin >>  11/17 cefepime >>  11/17 flagyl x 1  Dose adjustments this admission:   Microbiology results:   Arturo Morton, PharmD, BCPS Please check AMION for all [place] contact numbers Clinical Pharmacist 01/05/2022 9:46 AM

## 2022-01-05 NOTE — Brief Op Note (Signed)
01/05/2022  4:31 PM  PATIENT:  Timothy Davenport  71 y.o. male  PRE-OPERATIVE DIAGNOSIS:  Necrotizing tissure abscess  POST-OPERATIVE DIAGNOSIS:  Necrotizing tissure abscess  PROCEDURE:  Procedure(s): IRRIGATION AND DEBRIDEMENT NECROTIZING  TISSUE RECTAL ABSCESS (N/A)  SURGEON:  Surgeon(s) and Role:    * Vira Agar, MD - Primary    * Dwan Bolt, MD - Consult  PHYSICIAN ASSISTANT:   ASSISTANTS: none   ANESTHESIA:   general  EBL:  300 mL   BLOOD ADMINISTERED  DRAINS: Urinary Catheter (Foley)   LOCAL MEDICATIONS USED:  NONE  SPECIMEN:  culture   COUNTS:  YES  DICTATION: .Note written in EPIC  PLAN OF CARE: Admit to inpatient   PATIENT DISPOSITION:  PACU - guarded condition.   Delay start of Pharmacological VTE agent (>24hrs) due to surgical blood loss or risk of bleeding: not applicable

## 2022-01-05 NOTE — Hospital Course (Addendum)
The patient is status post third I&D yesterday, 11/21. Per op note, this was tolerated well and the patient no longer requires repeat sharp debridement. He remains hemodynamically stable, WBC is 9.8. Would culture day 6 has only grown Citrobacter koseri. Blood cultures day 5 show no growth. He is on antibiotics day 6, Ceftriaxone day 3. Plan to further narrow antibiotics to Cefazolin. Urology would like to repeat imaging in a few days, and if pelvic abscess persists, will consider IR drainage. The patient and his wife prefer wound care at SNF, so will explore options for anticipated dispo early next week. Will also have heel covers placed to avoid skin breakdown during admission. Will follow urology and surgery's recommendations.   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 On exam, he appeared lethargic with an ulceration and drainage of the scrotum. Scrotal US showed complex fluid collection between the testicles concerning for abscess vs. Hematoma. CT showed extensive gas in the scrotum as well as left lower anterior pelvic wall c/f for necrotizing infection. He also had a leukocytosis of 18 with a mildly decreased blood pressure. He received 3 incision and debridements by urology and surgery on 11/17, 11/19, and 11/21. Wound culture grew pan-sensitive Citrobacter koseri. He was started on IV Zosyn and linezolid for 4 days, narrowed to Ceftriaxone for 3 days, Cefazolin 2 days, Zosyn 4 days, and  finally PO Cefadroxil/flagyl at discharge. Repeat CT a/p showed increasing size of abscess around right seminal vesicle, and IR placed a right sided drainage catheter on 11/24 and will continue following outpatient. Dr. Lovena Le from plastic surgery performed partial wound closure on 12/1, and he anticipates more surgical interventions for closure. The patient remained hemodynamically stable during his admission, and his WBC normalized with antibiotics. Plastic surgery would like the patient to return in approximately 2 weeks from initial wound closure on 12/1 for a brief 1-2 day hospital stay for his next closure procedure.  -   Hypokalemia Hypophosphatemia The patient had a poor appetite during admission, and required frequent repletion of potassium and phosphorous. Ensure protein supplementation was added on BID to improve nutrition. Mirtazapine was also administered qhs to improve appetite and aid with sleep. The patient's appetite continued to fluctuate during admission, and NG tube was considered and discussed with patient and family. Nutrition issues were discussed with [facility], and they are equipped to place NG if necessary.   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 Semglee 10 units daily on home med list, held due to initial poor po intake. He was placed on SSI during admission. As his appetite slowly improved, Novolog 3 u TID with meals was added to improve glycemic control.   History of CVA Memory changes Hospital delirium  During admission, the patient began experiencing some disorientation, attributed to hospital delirium. He was started on Mirtazapine nightly, which improved his sleep and kept him on a normal sleep/wake  cycle. The patient and his wife reported chronic memory changes prior to admission, which will likely be addressed in the outpatient setting.   Mucus production Towards the end of  admission, the patient began to endorse clear sputum production. During exam, he was observed coughing up clear sputum into a vomit bag. He denied cough, congestion, and shortness of breath. He remained afebrile with SpO2 99-100% on RA since admission. Lungs were clear on exam. Wife reported the patient has a history of acid reflux causing him to cough up phlegm, for which he takes Tums at home. Nursing also reported some nausea and acid reflux. Protonix, mucinex, and Zofran was added on PRN.   AKI on CKD3b Serum creatinine was 1.86 on admission. The patient's last serum creatinine was 1.4 in October. This was thought to be due to poor p.o. intake. After IV fluids, his creatinine returned to baseline. His creatinine remained at baseline for the rest of his admission.   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 We clarified with the [facility] hospital that the patient is prescribed Dantrolene 25 mg TID, so we continued this dose inpatient. Also continued Oxybutynin 7.5 mg twice daily.    HTN Home losartan was held. He remained normotensive during admission.   Iron deficiency anemia   Home ferrous sulfate was held in setting of ongoing infection requiring further debridement. His hemoglobin remained stable around 8 during admission, and he did not require any iron or blood products.    PTSD Depression The patient continued home sertraline during admission.

## 2022-01-05 NOTE — Transfer of Care (Signed)
Immediate Anesthesia Transfer of Care Note  Patient: Timothy Davenport  Procedure(s) Performed: IRRIGATION AND DEBRIDEMENT NECROTIZING  TISSUE RECTAL ABSCESS  Patient Location: PACU  Anesthesia Type:General  Level of Consciousness: awake, alert , and oriented  Airway & Oxygen Therapy: Patient Spontanous Breathing  Post-op Assessment: Report given to RN and Post -op Vital signs reviewed and stable  Post vital signs: Reviewed and stable  Last Vitals:  Vitals Value Taken Time  BP 146/70   Temp 98   Pulse 65   Resp 16   SpO2 98     Last Pain:  Vitals:   01/05/22 1238  TempSrc:   PainSc: 0-No pain      Patients Stated Pain Goal: 2 (40/98/11 9147)  Complications: No notable events documented.

## 2022-01-05 NOTE — Anesthesia Preprocedure Evaluation (Addendum)
Anesthesia Evaluation  Patient identified by MRN, date of birth, ID band Patient awake    Reviewed: Allergy & Precautions, NPO status , Patient's Chart, lab work & pertinent test results  History of Anesthesia Complications Negative for: history of anesthetic complications  Airway Mallampati: II  TM Distance: >3 FB Neck ROM: Full    Dental  (+) Edentulous Lower, Edentulous Upper   Pulmonary neg pulmonary ROS   Pulmonary exam normal        Cardiovascular hypertension, Normal cardiovascular exam     Neuro/Psych Paraplegia, uses wheelchair    GI/Hepatic negative GI ROS, Neg liver ROS,,,  Endo/Other  diabetes, Type 2, Insulin Dependent, Oral Hypoglycemic Agents    Renal/GU Cr 1.9  negative genitourinary   Musculoskeletal negative musculoskeletal ROS (+)    Abdominal   Peds  Hematology negative hematology ROS (+)   Anesthesia Other Findings Day of surgery medications reviewed with patient.  Reproductive/Obstetrics negative OB ROS                              Anesthesia Physical Anesthesia Plan  ASA: 3  Anesthesia Plan: General   Post-op Pain Management: Tylenol PO (pre-op)*   Induction: Intravenous  PONV Risk Score and Plan: 2 and Treatment may vary due to age or medical condition, Dexamethasone and Ondansetron  Airway Management Planned: Oral ETT  Additional Equipment: None  Intra-op Plan:   Post-operative Plan: Extubation in OR  Informed Consent: I have reviewed the patients History and Physical, chart, labs and discussed the procedure including the risks, benefits and alternatives for the proposed anesthesia with the patient or authorized representative who has indicated his/her understanding and acceptance.     Dental advisory given  Plan Discussed with: CRNA  Anesthesia Plan Comments:         Anesthesia Quick Evaluation

## 2022-01-05 NOTE — Consult Note (Signed)
Urology Consult   Reason for consult: Fournier's gangrene  History of Present Illness: Timothy Davenport is a 71 y.o. who presented to the ED earlier today c/o altered mental status. He was found to have a necrotic infection of his scrotum and GU was consulted  CT scan obtained in the ED which shows a gas forming infection tracking from his scrotum to the lower abdominal wall. There is a small amount of air in the bladder as well. Of note, there is a 4.5 cm fluid collection adjacent to the R SV concerning for infection, and a small area in his prostate that could be an abscess measuring ~ 1 cm.   [name] does CIC at baseline due to a neurogenic bladder resulting from a spinal cord injury. He was seen in the ED two days ago and discharged on abx for a GU infection and testicular swelling.   His vital signs are stable, afebrile.   Past Medical History:  Diagnosis Date   Colon polyp    Diabetes mellitus without complication (HCC)    Diabetic neuropathy (HCC)    HTN (hypertension)    Patella fracture    Renal calculi     Past Surgical History:  Procedure Laterality Date   LUMBAR LAMINECTOMY/DECOMPRESSION MICRODISCECTOMY N/A 07/01/2019   Procedure: LUMBAR LAMINECTOMY/DECOMPRESSION MICRODISCECTOMY 1 LEVEL, THORACIC SIX-SEVEN;  Surgeon: Consuella Lose, MD;  Location: [facility];  Service: Neurosurgery;  Laterality: N/A;  LUMBAR LAMINECTOMY/DECOMPRESSION MICRODISCECTOMY 1 LEVEL, THORACIC SIX-SEVEN    ORIF PATELLA     THORACIC DISCECTOMY N/A 07/03/2019   Procedure: Evacuation of Thoracic Hematoma;  Surgeon: Consuella Lose, MD;  Location: [facility];  Service: Neurosurgery;  Laterality: N/A;    Current Hospital Medications:  Home Meds:  No current facility-administered medications on file prior to encounter.   Current Outpatient Medications on File Prior to Encounter  Medication Sig Dispense Refill   acetaminophen (TYLENOL) 325 MG tablet Take 1-2 tablets (325-650 mg total) by mouth every 4  (four) hours as needed for mild pain.     Alogliptin Benzoate 25 MG TABS Take 25 tablets by mouth daily with breakfast.     ammonium lactate (AMLACTIN) 12 % cream Apply 1 application. topically 2 (two) times daily as needed for dry skin.     aspirin EC 81 MG tablet Take 1 tablet (81 mg total) by mouth daily. Swallow whole. 30 tablet 11   baclofen (LIORESAL) 20 MG tablet Take 1 tablet by mouth 3 (three) times daily.     bisacodyl (DULCOLAX) 10 MG suppository UNWRAP AND INSERT 1 SUPPOSITORY(10 MG) RECTALLY DAILY AFTER SUPPER (Patient taking differently: Place 10 mg rectally daily as needed for mild constipation. UNWRAP AND INSERT 1 SUPPOSITORY(10 MG) RECTALLY DAILY AFTER SUPPER) 30 suppository 0   cefadroxil (DURICEF) 500 MG capsule Take 1 capsule (500 mg total) by mouth 2 (two) times daily. 14 capsule 0   Cholecalciferol 100 MCG (4000 UT) TABS Take 4,000 Units by mouth daily.     dantrolene (DANTRIUM) 100 MG capsule Take 1 capsule (100 mg total) by mouth 3 (three) times daily. 270 capsule 1   empagliflozin (JARDIANCE) 25 MG TABS tablet Take 12.5 mg by mouth daily.     fluconazole (DIFLUCAN) 150 MG tablet Take 1 tablet (150 mg total) by mouth daily for 2 doses. Take 1 dose immediately, and another dose in 7 days if you are having any ongoing yeast infection symptoms 2 tablet 0   fluticasone (FLONASE) 50 MCG/ACT nasal spray Place 2 sprays into  both nostrils daily.     gabapentin (NEURONTIN) 400 MG capsule Take 800 mg by mouth 3 (three) times daily.     glucose 4 GM chewable tablet Chew 4 tablets by mouth as needed for low blood sugar.     insulin aspart (NOVOLOG) 100 UNIT/ML FlexPen Inject 8 Units into the skin at bedtime.     lidocaine (XYLOCAINE) 2 % jelly Apply 1 application topically at bedtime as needed (apply to rectum with digital stim).     oxybutynin (DITROPAN) 5 MG tablet Take 7.5 mg by mouth 2 (two) times daily.     pioglitazone (ACTOS) 30 MG tablet Take 30 mg by mouth daily.      Polyethylene Glycol 3350 POWD Take 1 Dose by mouth daily as needed (constipation). Take one capful by mouth every day in 8 ounces of liquid as needed     polyvinyl alcohol (LIQUIFILM TEARS) 1.4 % ophthalmic solution Place 1 drop into both eyes 4 (four) times daily. 15 mL 0   senna-docusate (SENOKOT-S) 8.6-50 MG tablet Take 2 tablets by mouth daily.     sertraline (ZOLOFT) 100 MG tablet Take 100 mg by mouth daily.     sildenafil (VIAGRA) 100 MG tablet Take 100 mg by mouth daily as needed for erectile dysfunction. Take one hour prior to sexual activity **Do NOT exceed 1 dose per 24 hour period**     simvastatin (ZOCOR) 20 MG tablet Take 20 mg by mouth daily at 6 PM.       Scheduled Meds:  [MAR Hold] vancomycin variable dose per unstable renal function (pharmacist dosing)   Does not apply See admin instructions   Continuous Infusions:  [MAR Hold] ceFEPime (MAXIPIME) IV     [MAR Hold] lactated ringers     And   [MAR Hold] lactated ringers     lactated ringers 150 mL/hr at 01/05/22 1051   lactated ringers 10 mL/hr at 01/05/22 1242   vancomycin 2,000 mg (01/05/22 1056)   PRN Meds:.insulin aspart  Allergies:  Allergies  Allergen Reactions   Lisinopril Other (See Comments)    Angioedema   Enalapril-Hctz [Enalapril-Hydrochlorothiazide] Rash    Family History  Problem Relation Age of Onset   Hypertension Mother    Hypertension Father     Social History:  reports that he has never smoked. He has never used smokeless tobacco. He reports current alcohol use. He reports current drug use. Drug: Marijuana.  ROS: A complete review of systems was performed.  All systems are negative except for pertinent findings as noted.  Physical Exam:  Vital signs in last 24 hours: Temp:  [98.6 F (37 C)-98.7 F (37.1 C)] 98.7 F (37.1 C) (11/17 1145) Pulse Rate:  [55-66] 55 (11/17 1145) Resp:  [12-18] 16 (11/17 1145) BP: (97-130)/(61-77) 121/70 (11/17 1145) SpO2:  [99 %] 99 % (11/17  1145) Weight:  [77.1 kg-81.6 kg] 77.1 kg (11/17 1145) Constitutional:  Alert and oriented, No acute distress Cardiovascular: Regular rate and rhythm Respiratory: Normal respiratory effort, Lungs clear bilaterally GI: Abdomen is soft, nontender, nondistended, no abdominal masses GU: No CVA tenderness; midline scrotal wound draining purulent material, there is palpable crepitus No crepitus in lower abdominal wall Penis is edematous.  Neurologic: Grossly intact, no focal deficits Psychiatric: Normal mood and affect  Laboratory Data:  Recent Labs    01/03/22 0941 01/05/22 0900 01/05/22 0920  WBC 11.1* 18.0*  --   HGB 14.0 12.8* 14.3  HCT 43.8 40.0 42.0  PLT 248 204  --  Recent Labs    01/03/22 0941 01/05/22 0900 01/05/22 0920  NA 139 137 138  K 3.7 4.1 4.1  CL 104 100 104  GLUCOSE 145* 164* 160*  BUN 30* 46* 43*  CALCIUM 10.7* 10.5*  --   CREATININE 1.26* 1.86* 1.90*     Results for orders placed or performed during the hospital encounter of 01/05/22 (from the past 24 hour(s))  CBC with Differential     Status: Abnormal   Collection Time: 01/05/22  9:00 AM  Result Value Ref Range   WBC 18.0 (H) 4.0 - 10.5 K/uL   RBC 5.14 4.22 - 5.81 MIL/uL   Hemoglobin 12.8 (L) 13.0 - 17.0 g/dL   HCT 40.0 39.0 - 52.0 %   MCV 77.8 (L) 80.0 - 100.0 fL   MCH 24.9 (L) 26.0 - 34.0 pg   MCHC 32.0 30.0 - 36.0 g/dL   RDW 16.8 (H) 11.5 - 15.5 %   Platelets 204 150 - 400 K/uL   nRBC 0.0 0.0 - 0.2 %   Neutrophils Relative % 90 %   Neutro Abs 16.2 (H) 1.7 - 7.7 K/uL   Lymphocytes Relative 5 %   Lymphs Abs 0.9 0.7 - 4.0 K/uL   Monocytes Relative 5 %   Monocytes Absolute 0.9 0.1 - 1.0 K/uL   Eosinophils Relative 0 %   Eosinophils Absolute 0.0 0.0 - 0.5 K/uL   Basophils Relative 0 %   Basophils Absolute 0.0 0.0 - 0.1 K/uL   nRBC 0 0 /100 WBC   Abs Immature Granulocytes 0.00 0.00 - 0.07 K/uL  Comprehensive metabolic panel     Status: Abnormal   Collection Time: 01/05/22  9:00 AM   Result Value Ref Range   Sodium 137 135 - 145 mmol/L   Potassium 4.1 3.5 - 5.1 mmol/L   Chloride 100 98 - 111 mmol/L   CO2 23 22 - 32 mmol/L   Glucose, Bld 164 (H) 70 - 99 mg/dL   BUN 46 (H) 8 - 23 mg/dL   Creatinine, Ser 1.86 (H) 0.61 - 1.24 mg/dL   Calcium 10.5 (H) 8.9 - 10.3 mg/dL   Total Protein 7.7 6.5 - 8.1 g/dL   Albumin 2.6 (L) 3.5 - 5.0 g/dL   AST 16 15 - 41 U/L   ALT 12 0 - 44 U/L   Alkaline Phosphatase 71 38 - 126 U/L   Total Bilirubin 0.6 0.3 - 1.2 mg/dL   GFR, Estimated 38 (L) >60 mL/min   Anion gap 14 5 - 15  Lactic acid, plasma     Status: None   Collection Time: 01/05/22  9:00 AM  Result Value Ref Range   Lactic Acid, Venous 1.5 0.5 - 1.9 mmol/L  Ammonia     Status: None   Collection Time: 01/05/22  9:00 AM  Result Value Ref Range   Ammonia 10 9 - 35 umol/L  I-stat chem 8, ED (not at [facility] or [facility])     Status: Abnormal   Collection Time: 01/05/22  9:20 AM  Result Value Ref Range   Sodium 138 135 - 145 mmol/L   Potassium 4.1 3.5 - 5.1 mmol/L   Chloride 104 98 - 111 mmol/L   BUN 43 (H) 8 - 23 mg/dL   Creatinine, Ser 1.90 (H) 0.61 - 1.24 mg/dL   Glucose, Bld 160 (H) 70 - 99 mg/dL   Calcium, Ion 1.35 1.15 - 1.40 mmol/L   TCO2 24 22 - 32 mmol/L   Hemoglobin 14.3 13.0 -  17.0 g/dL   HCT 42.0 39.0 - 52.0 %  Urinalysis, Routine w reflex microscopic Urine, Clean Catch     Status: Abnormal   Collection Time: 01/05/22 11:00 AM  Result Value Ref Range   Color, Urine YELLOW YELLOW   APPearance TURBID (A) CLEAR   Specific Gravity, Urine 1.028 1.005 - 1.030   pH 5.0 5.0 - 8.0   Glucose, UA >=500 (A) NEGATIVE mg/dL   Hgb urine dipstick MODERATE (A) NEGATIVE   Bilirubin Urine NEGATIVE NEGATIVE   Ketones, ur NEGATIVE NEGATIVE mg/dL   Protein, ur 30 (A) NEGATIVE mg/dL   Nitrite NEGATIVE NEGATIVE   Leukocytes,Ua LARGE (A) NEGATIVE   RBC / HPF 11-20 0 - 5 RBC/hpf   WBC, UA >50 (H) 0 - 5 WBC/hpf   Bacteria, UA FEW (A) NONE SEEN   Squamous Epithelial / LPF 0-5 0 - 5    WBC Clumps PRESENT    Mucus PRESENT    Budding Yeast PRESENT   Glucose, capillary     Status: Abnormal   Collection Time: 01/05/22 12:23 PM  Result Value Ref Range   Glucose-Capillary 144 (H) 70 - 99 mg/dL   Recent Results (from the past 240 hour(s))  Urine Culture     Status: Abnormal   Collection Time: 01/03/22  7:20 PM   Specimen: Urine, Catheterized  Result Value Ref Range Status   Specimen Description URINE, CATHETERIZED  Final   Special Requests   Final    NONE Performed at [facility], [address], [place], [place] [address]    Culture MULTIPLE SPECIES PRESENT, SUGGEST RECOLLECTION (A)  Final   Report Status 01/05/2022 FINAL  Final    Renal Function: Recent Labs    01/03/22 0941 01/05/22 0900 01/05/22 0920  CREATININE 1.26* 1.86* 1.90*   Estimated Creatinine Clearance: 36.9 mL/min (A) (by C-G formula based on SCr of 1.9 mg/dL (H)).  Radiologic Imaging: CT ABDOMEN PELVIS W CONTRAST  Result Date: 01/05/2022 CLINICAL DATA:  Scrotal pain with purulent drainage. Concern for Fournier's gangrene. EXAM: CT ABDOMEN AND PELVIS WITH CONTRAST TECHNIQUE: Multidetector CT imaging of the abdomen and pelvis was performed using the standard protocol following bolus administration of intravenous contrast. RADIATION DOSE REDUCTION: This exam was performed according to the departmental dose-optimization program which includes automated exposure control, adjustment of the mA and/or kV according to patient size and/or use of iterative reconstruction technique. CONTRAST:  10m OMNIPAQUE IOHEXOL 350 MG/ML SOLN COMPARISON:  Scrotal ultrasound 2 days prior, CT abdomen/pelvis 04/24/2021 FINDINGS: Lower chest: The lung bases are clear. Coronary artery calcifications and aortic valve calcifications are noted. Hepatobiliary: Numerous hypodense lesions throughout the liver are similar to the prior study, likely benign cysts for which no specific imaging follow-up is required. The gallbladder is  unremarkable. There is no biliary ductal dilatation. Pancreas: Unremarkable. Spleen: Unremarkable. Adrenals/Urinary Tract: The adrenals are unremarkable. There is a 7 mm nonobstructing right lower pole stone. Small hypodense lesions in the kidneys most likely reflect benign cysts for which no specific imaging follow-up is required. There is no hydronephrosis or hydroureter. There is some excretion of contrast into the collecting systems on the delayed images. There is a small amount of gas in the bladder. Stomach/Bowel: The stomach is unremarkable. There is no evidence of bowel obstruction. There is no abnormal bowel wall thickening or inflammatory change. There is colonic diverticulosis without evidence of acute diverticulitis. The appendix is normal. Vascular/Lymphatic: There is calcified plaque throughout the nonaneurysmal abdominal aorta. The major branch  vessels are patent. The main portal and splenic veins are patent. There is no abdominal or pelvic lymphadenopathy. Reproductive: There is a 3.8 cm x 2.9 cm by 4.4 cm peripherally enhancing collection in the region of the right seminal vesicle superior to the prostate. There may also be a 1.0 cm abscess in the right aspect of the prostate (3-89). There is extensive gas tracking from the scrotum on the left into the soft tissues of the lower anterior pelvic wall. There is coarse calcification in the right scrotum. Other: There is no ascites or free intraperitoneal air. Musculoskeletal: There is no acute osseous abnormality or suspicious osseous lesion. IMPRESSION: 1. Extensive gas tracking from the scrotum on the left into the soft tissues of the lower anterior pelvic wall highly concerning for necrotizing infection/Fournier's gangrene. 2. 3.8 cm x 2.9 cm x 4.4 cm peripherally enhancing collection in the region of the right seminal vesicle superior to the prostate concerning for abscess, and additional possible small 1.0 cm prostatic abscess. 3. Small amount of  gas in the bladder may be related to recent instrumentation or spread of infection. 4. 7 mm nonobstructing right lower pole renal stone. Critical Value/emergent results were called by telephone at the time of interpretation on 01/05/2022 at 10:06 am to provider [facility] , who verbally acknowledged these results. Electronically Signed   By: Valetta Mole M.D.   On: 01/05/2022 10:11   DG Chest Portable 1 View  Result Date: 01/05/2022 CLINICAL DATA:  Altered mental status EXAM: PORTABLE CHEST 1 VIEW COMPARISON:  CXR 07/09/19 FINDINGS: No pleural effusion. No pneumothorax. No displaced rib fracture. Normal cardiac and mediastinal contours. Visualized upper abdomen is unremarkable. Degenerative changes of the right AC and glenohumeral joint. IMPRESSION: No focal airspace opacity. Electronically Signed   By: Marin Roberts M.D.   On: 01/05/2022 08:50   CT Head Wo Contrast  Result Date: 01/03/2022 CLINICAL DATA:  Neurological deficit, nausea EXAM: CT HEAD WITHOUT CONTRAST TECHNIQUE: Contiguous axial images were obtained from the base of the skull through the vertex without intravenous contrast. RADIATION DOSE REDUCTION: This exam was performed according to the departmental dose-optimization program which includes automated exposure control, adjustment of the mA and/or kV according to patient size and/or use of iterative reconstruction technique. COMPARISON:  07/22/2019 FINDINGS: Brain: No acute intracranial findings are seen. There are no signs of bleeding within the cranium. Minimal calcifications are seen in basal ganglia. Ventricles are not dilated. Cortical sulci are prominent. There is 8 mm extra-axial calcification inseparable from the meninges in the left frontoparietal region. There is no adjacent edema or mass effect. This finding appears stable. Vascular: Unremarkable. Skull: No fracture is seen. Sinuses/Orbits: Unremarkable. Other: There is increased amount of CSF in the sella suggesting  partial empty sella. IMPRESSION: No acute intracranial findings are seen in noncontrast CT brain. Atrophy. 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 Electronically Signed   By: Elmer Picker M.D.   On: 01/03/2022 20:04    I independently reviewed the above imaging studies.  Impression/Recommendation 71 yo m with history of neurogenic bladder, now with Fournier's gangrene  --To OR emergently for incision/debridement. I discussed with his wife I will try to drain the seminal vesical collection via transrectal ultrasound. This may not be possible, in which case I would consult IR to drain. We discussed risks include worsening of infection,  need for future debridements, debridement of a significant amount of genital skin, testicle loss, death. His wife voiced understanding.  Donald Pore MD 01/05/2022, 12:55 PM  [facility]  Pager: [phone]

## 2022-01-05 NOTE — Op Note (Signed)
Date: 01/05/22  Patient: Timothy Davenport MRN: [id number]  Preoperative Diagnosis: Fournier's gangrene Postoperative Diagnosis: Same  Procedure: Incision and debridement of left inguinal canal  Surgeon: Michaelle Birks, MD  EBL: Minimal  Anesthesia: General  Findings: Necrotizing soft tissue infection extending into the left inguinal canal, left lower quadrant abdominal wall and left upper thigh.  Procedure details: I was consulted intraoperatively. The scrotum and perineum had been debrided by Dr. Cain Sieve.  There was extension of the necrosis into the left inguinal canal.  I incised the overlying skin to expose the entire right inguinal canal.  On probing the wound purulent drainage was noted from the base of the wound.  The necrotic tissue was sharply debrided.  There was also some purulence noted from the proximal left thigh.  The external oblique fascia was normal in appearance.  Once the necrotic tissue was fully debrided, hemostasis was achieved with cautery.  At this point the remainder of the procedure was turned back over to Dr. Cain Sieve. The patient will likely need further debridement within the next 48 hours.  Michaelle Birks, MD 01/05/22 4:18 PM

## 2022-01-05 NOTE — Sepsis Progress Note (Signed)
eLink is following this Code Sepsis. °

## 2022-01-05 NOTE — Progress Notes (Signed)
Clinical update:   I re-evaluated this patient after he returned from surgery to assess his clinical status. He just underwent an irrigation and debridement of the scrotum, perineum, and left inguinal canal by Drs. Zenia Resides and Machen due to diagnosis of Fournier's gangrene.   The patient is somnolent upon my evaluation, but states that the surgery went well. He is not in any pain and is comfortable at this time, but notes that he is hungry. He is A&Ox3, but again, is somnolent and intermittently falling asleep upon interview.   Physical exam: General: Elderly male laying in bed, in no acute distress, but is somnolent CV: RRR Pulm: CTAB, normal work of breathing. Neuro: A&Ox3. Paraplegic.   Plan: Will continue to monitor the patient and will place order for a diet at this time. Tylenol 1000 mg q6h prn is ordered for mild pain. If the patient does develop any pain that is not relieved with Tylenol, could consider low-dose opioids during the post-operative period, but will hold off on ordering at this time as the patient is not in any pain. Would be cautious with higher doses of opioids, as the patient is already somnolent and presented with lethargy and confusion and we would not want to exacerbate this.     Buddy Duty, DO Internal Medicine Resident, PGY-2 On-call pager: [phone]

## 2022-01-05 NOTE — Consult Note (Signed)
Timothy Davenport 1950-11-22  [id number].    Requesting MD: Sherry Ruffing, MD Chief Complaint/Reason for Consult: fourneirs   HPI:  Timothy Davenport is a 71 y/o M with a PMH HTN, DM, HLD, T8 spinal cord injury/paraplegia, neurogenic bladder, and depression who presents to [facility] via EMS with altered mental status. Patient was evaluated in the ED 2 days ago for confusion and penile discharge. Scrotal ultrasound showed possible scrotal abscess vs hematoma and CT head was negative for acute pathology. WBC was 11. He was diagnosed with UTI and balanitis discharged from the ED with cefadroxil, diflucan, and topical antifungal for balanitis. He was given an neurology referral. He returns today with worsening confusion. Due to his mental status his wife is reporting his history. She notes 2 weeks of scrotal swelling and pain with worsening confusion compared to his baseline. No fevers at home. Started draining foul smelling fluid yesterday.   ED workup: AFVSS, WBC 18, Cr 1.9, CT abdomen pelvis performed due to scrotal pain with purulent drainage and revealed abscess and gas tracking from the scrotum to the lower anterior pelvic wall. General surgery was consulted, in addition to urology, for fournier's gangrene. He has been started on cefepime and vancomycin. No tobacco use. On '81mg'$  asa but no other blood thinners. On insulin for dm2. Sugars have been 100-200's at home per wife. No hx abdominal surgeries.   ROS: Review of Systems  Unable to perform ROS: Mental status change    Family History  Problem Relation Age of Onset   Hypertension Mother    Hypertension Father     Past Medical History:  Diagnosis Date   Colon polyp    Diabetes mellitus without complication (HCC)    Diabetic neuropathy (HCC)    HTN (hypertension)    Patella fracture    Renal calculi     Past Surgical History:  Procedure Laterality Date   LUMBAR LAMINECTOMY/DECOMPRESSION MICRODISCECTOMY N/A 07/01/2019   Procedure:  LUMBAR LAMINECTOMY/DECOMPRESSION MICRODISCECTOMY 1 LEVEL, THORACIC SIX-SEVEN;  Surgeon: Consuella Lose, MD;  Location: ;  Service: Neurosurgery;  Laterality: N/A;  LUMBAR LAMINECTOMY/DECOMPRESSION MICRODISCECTOMY 1 LEVEL, THORACIC SIX-SEVEN    ORIF PATELLA     THORACIC DISCECTOMY N/A 07/03/2019   Procedure: Evacuation of Thoracic Hematoma;  Surgeon: Consuella Lose, MD;  Location: [facility];  Service: Neurosurgery;  Laterality: N/A;    Social History:  reports that he has never smoked. He has never used smokeless tobacco. He reports current alcohol use. He reports current drug use. Drug: Marijuana.  Allergies:  Allergies  Allergen Reactions   Lisinopril Other (See Comments)    Angioedema   Enalapril-Hctz [Enalapril-Hydrochlorothiazide] Rash    (Not in a hospital admission)    Physical Exam: Blood pressure 97/61, pulse 66, temperature 98.6 F (37 C), temperature source Oral, resp. rate 12, height '5\' 11"'$  (1.803 m), weight 81.6 kg, SpO2 99 %. General: ill appearing black male, confused HEENT: head -normocephalic, atraumatic; Eyes anicteric sclerae CV- RRR, no lower extremity edema Pulm- breathing is non-labored ORA, CTA b/l.  Abd- soft, NT/ND, +BS GU- Chaperone, RN present. There is induration, swelling, and tenderness of the mons tracking down to scrotum. There is no palpable crepitance. No drainage or open wound. The scrotum is edematous, firm and warm to touch. The left scrotal wall has an area of necrosis as seen below - EDP reported purulent output earlier - no drainage currently. He also has a fibrinous area on right medial thigh measuring ~1x1cm but no surrounding  skin changes and seems separate from above process but difficult to completely exclude. This does not seem to involve the perineum or track towards buttocks.   MSK- UE/LE symmetrical, no cyanosis, clubbing, or edema. Neuro- CN II-XII grossly in tact, no paresthesias. Psych- Alert and Oriented x3 with appropriate  affect Skin: warm and dry, no rashes or lesions   Results for orders placed or performed during the hospital encounter of 01/05/22 (from the past 48 hour(s))  CBC with Differential     Status: Abnormal   Collection Time: 01/05/22  9:00 AM  Result Value Ref Range   WBC 18.0 (H) 4.0 - 10.5 K/uL   RBC 5.14 4.22 - 5.81 MIL/uL   Hemoglobin 12.8 (L) 13.0 - 17.0 g/dL   HCT 40.0 39.0 - 52.0 %   MCV 77.8 (L) 80.0 - 100.0 fL   MCH 24.9 (L) 26.0 - 34.0 pg   MCHC 32.0 30.0 - 36.0 g/dL   RDW 16.8 (H) 11.5 - 15.5 %   Platelets 204 150 - 400 K/uL   nRBC 0.0 0.0 - 0.2 %   Neutrophils Relative % 90 %   Neutro Abs 16.2 (H) 1.7 - 7.7 K/uL   Lymphocytes Relative 5 %   Lymphs Abs 0.9 0.7 - 4.0 K/uL   Monocytes Relative 5 %   Monocytes Absolute 0.9 0.1 - 1.0 K/uL   Eosinophils Relative 0 %   Eosinophils Absolute 0.0 0.0 - 0.5 K/uL   Basophils Relative 0 %   Basophils Absolute 0.0 0.0 - 0.1 K/uL   nRBC 0 0 /100 WBC   Abs Immature Granulocytes 0.00 0.00 - 0.07 K/uL    Comment: Performed at [facility], [address], [place], [place] [address]  Comprehensive metabolic panel     Status: Abnormal   Collection Time: 01/05/22  9:00 AM  Result Value Ref Range   Sodium 137 135 - 145 mmol/L   Potassium 4.1 3.5 - 5.1 mmol/L   Chloride 100 98 - 111 mmol/L   CO2 23 22 - 32 mmol/L   Glucose, Bld 164 (H) 70 - 99 mg/dL    Comment: Glucose reference range applies only to samples taken after fasting for at least 8 hours.   BUN 46 (H) 8 - 23 mg/dL   Creatinine, Ser 1.86 (H) 0.61 - 1.24 mg/dL   Calcium 10.5 (H) 8.9 - 10.3 mg/dL   Total Protein 7.7 6.5 - 8.1 g/dL   Albumin 2.6 (L) 3.5 - 5.0 g/dL   AST 16 15 - 41 U/L   ALT 12 0 - 44 U/L   Alkaline Phosphatase 71 38 - 126 U/L   Total Bilirubin 0.6 0.3 - 1.2 mg/dL   GFR, Estimated 38 (L) >60 mL/min    Comment: (NOTE) Calculated using the CKD-EPI Creatinine Equation (2021)    Anion gap 14 5 - 15    Comment: Performed at [facility], [place] [address], [place], [name] [address]  Lactic acid, plasma     Status: None   Collection Time: 01/05/22  9:00 AM  Result Value Ref Range   Lactic Acid, Venous 1.5 0.5 - 1.9 mmol/L    Comment: Performed at [facility] [address], [place], [place] [address]  Ammonia     Status: None   Collection Time: 01/05/22  9:00 AM  Result Value Ref Range   Ammonia 10 9 - 35 umol/L    Comment: Performed at [facility], [place]  [address], [place], [place] [address]  I-stat chem 8, ED (not at [facility] or [facility])     Status: Abnormal   Collection Time: 01/05/22  9:20 AM  Result Value Ref Range   Sodium 138 135 - 145 mmol/L   Potassium 4.1 3.5 - 5.1 mmol/L   Chloride 104 98 - 111 mmol/L   BUN 43 (H) 8 - 23 mg/dL   Creatinine, Ser 1.90 (H) 0.61 - 1.24 mg/dL   Glucose, Bld 160 (H) 70 - 99 mg/dL    Comment: Glucose reference range applies only to samples taken after fasting for at least 8 hours.   Calcium, Ion 1.35 1.15 - 1.40 mmol/L   TCO2 24 22 - 32 mmol/L   Hemoglobin 14.3 13.0 - 17.0 g/dL   HCT 42.0 39.0 - 52.0 %   CT ABDOMEN PELVIS W CONTRAST  Result Date: 01/05/2022 CLINICAL DATA:  Scrotal pain with purulent drainage. Concern for Fournier's gangrene. EXAM: CT ABDOMEN AND PELVIS WITH CONTRAST TECHNIQUE: Multidetector CT imaging of the abdomen and pelvis was performed using the standard protocol following bolus administration of intravenous contrast. RADIATION DOSE REDUCTION: This exam was performed according to the departmental dose-optimization program which includes automated exposure control, adjustment of the mA and/or kV according to patient size and/or use of iterative reconstruction technique. CONTRAST:  68m OMNIPAQUE IOHEXOL 350 MG/ML SOLN COMPARISON:  Scrotal ultrasound 2 days prior, CT abdomen/pelvis 04/24/2021 FINDINGS: Lower chest: The lung bases are clear. Coronary artery calcifications and aortic valve calcifications are noted. Hepatobiliary: Numerous hypodense lesions throughout the  liver are similar to the prior study, likely benign cysts for which no specific imaging follow-up is required. The gallbladder is unremarkable. There is no biliary ductal dilatation. Pancreas: Unremarkable. Spleen: Unremarkable. Adrenals/Urinary Tract: The adrenals are unremarkable. There is a 7 mm nonobstructing right lower pole stone. Small hypodense lesions in the kidneys most likely reflect benign cysts for which no specific imaging follow-up is required. There is no hydronephrosis or hydroureter. There is some excretion of contrast into the collecting systems on the delayed images. There is a small amount of gas in the bladder. Stomach/Bowel: The stomach is unremarkable. There is no evidence of bowel obstruction. There is no abnormal bowel wall thickening or inflammatory change. There is colonic diverticulosis without evidence of acute diverticulitis. The appendix is normal. Vascular/Lymphatic: There is calcified plaque throughout the nonaneurysmal abdominal aorta. The major branch vessels are patent. The main portal and splenic veins are patent. There is no abdominal or pelvic lymphadenopathy. Reproductive: There is a 3.8 cm x 2.9 cm by 4.4 cm peripherally enhancing collection in the region of the right seminal vesicle superior to the prostate. There may also be a 1.0 cm abscess in the right aspect of the prostate (3-89). There is extensive gas tracking from the scrotum on the left into the soft tissues of the lower anterior pelvic wall. There is coarse calcification in the right scrotum. Other: There is no ascites or free intraperitoneal air. Musculoskeletal: There is no acute osseous abnormality or suspicious osseous lesion. IMPRESSION: 1. Extensive gas tracking from the scrotum on the left into the soft tissues of the lower anterior pelvic wall highly concerning for necrotizing infection/Fournier's gangrene. 2. 3.8 cm x 2.9 cm x 4.4 cm peripherally enhancing collection in the region of the right seminal  vesicle superior to the prostate concerning for abscess, and additional possible small 1.0 cm prostatic abscess. 3. Small amount of gas in the bladder may be related to recent instrumentation or spread  of infection. 4. 7 mm nonobstructing right lower pole renal stone. Critical Value/emergent results were called by telephone at the time of interpretation on 01/05/2022 at 10:06 am to provider [facility] , who verbally acknowledged these results. Electronically Signed   By: Valetta Mole M.D.   On: 01/05/2022 10:11   DG Chest Portable 1 View  Result Date: 01/05/2022 CLINICAL DATA:  Altered mental status EXAM: PORTABLE CHEST 1 VIEW COMPARISON:  CXR 07/09/19 FINDINGS: No pleural effusion. No pneumothorax. No displaced rib fracture. Normal cardiac and mediastinal contours. Visualized upper abdomen is unremarkable. Degenerative changes of the right AC and glenohumeral joint. IMPRESSION: No focal airspace opacity. Electronically Signed   By: Marin Roberts M.D.   On: 01/05/2022 08:50   CT Head Wo Contrast  Result Date: 01/03/2022 CLINICAL DATA:  Neurological deficit, nausea EXAM: CT HEAD WITHOUT CONTRAST TECHNIQUE: Contiguous axial images were obtained from the base of the skull through the vertex without intravenous contrast. RADIATION DOSE REDUCTION: This exam was performed according to the departmental dose-optimization program which includes automated exposure control, adjustment of the mA and/or kV according to patient size and/or use of iterative reconstruction technique. COMPARISON:  07/22/2019 FINDINGS: Brain: No acute intracranial findings are seen. There are no signs of bleeding within the cranium. Minimal calcifications are seen in basal ganglia. Ventricles are not dilated. Cortical sulci are prominent. There is 8 mm extra-axial calcification inseparable from the meninges in the left frontoparietal region. There is no adjacent edema or mass effect. This finding appears stable. Vascular:  Unremarkable. Skull: No fracture is seen. Sinuses/Orbits: Unremarkable. Other: There is increased amount of CSF in the sella suggesting partial empty sella. IMPRESSION: No acute intracranial findings are seen in noncontrast CT brain. Atrophy. 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 Electronically Signed   By: Elmer Picker M.D.   On: 01/03/2022 20:04      Assessment/Plan Fournier's gangrene  - Imaging and exam are consistent with Fournier's gangrene, seems to mostly involve the scrotum but also tracks anteriorly/superiorly towards the abdominal wall.  - Afebrile, currently hemodynamically stable - Recommend proceeding emergently to the OR for irrigation and debridement, Dr. Cain Sieve of Urology is on call today, per EDP urology has already been called. Operation can likely be done primarily by Urology with general surgery available for assistance. I did discuss indications and risks with the wife given patients confusion. She seems to understand this and agrees to proceed with operative debridement.  - Agree with broad spectrum abx, cultures to be obtained intra-operatively.  FEN - NPO, IVF per primary VTE - SCD's ID - Cefepime, Vanc Foley: indwelling foley likely needs to be placed but defer to Urology.  Admit - medical service. 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  Jillyn Ledger, [facility] 01/05/2022, 10:56 AM Please see Amion for pager number during day hours  7:00am-4:30pm or 7:00am -11:30am on weekends

## 2022-01-05 NOTE — H&P (Cosign Needed Addendum)
Date: 01/05/2022               Patient Name:  Timothy Davenport MRN: [id number]  DOB: 24-Jan-1951 Age / Sex: 71 y.o., male   PCP: Darnelle Catalan, PA         Medical Service: Internal Medicine Teaching Service         Attending Physician: Dr. Charise Killian, MD    First Contact: Dr. Linward Natal Pager: [phone]  Second Contact: Dr.  Farrel Gordon Pager: [phone]       After Hours (After 5p/  First Contact Pager: [phone]  weekends / holidays): Second Contact Pager: [phone]   Chief Complaint: Scrotal drainage  History of Present Illness:  Timothy Davenport 71 year T2DM on insulin, paraplegia T8 spinal cord injury, neurogenic bladder, CKD 3, HLD, prior CVA who presents for worsening left scrotal infection. Patient has not been feeling well for the past 2 weeks. Patient has been more lethargic, confused lately. Went to ED for scrotal swelling on 01/03/2022 and was given antibiotics for UTI and balanitis. Wife brought him back to ED today for purulent drainage and increase somnolence and confusion. State otherwise he has had poor appetite and not more mood as well.    Patient has been paraplegic due to a spinal cord injury from a fall in 2020. He intermittently catheterizes himself 3-4 times a day. Also has occasional incontinence of bowel an bladder due to lack of sensation. Denies fever, chills, chest pain, shortness of breath, nausea, vomiting, abdominal pain.  Meds:  Ascorbic acid aogliptin ASA 81 mg daily  Vitamin D Dantrolene 25 tice daily for  spasticity  Empagliflozin 25 mg daily Ferrous gluconate 389 mg MWF Folic acid Fluticasone spray Gabapentin 400 mg three times daily Glargine 10 units daily Losartan 25 mg daily Omeprazole 20 mg daily Oxybutynin 5 mg daily Pioglitazone 20 mg daily Sertraline '100mg'$  daily Sildenafil Simvastatin '20mg'$  daily  Allergies: Allergies as of 01/05/2022 - Review Complete 01/05/2022  Allergen Reaction Noted   Lisinopril Other (See Comments)  12/02/2019   Enalapril-hctz [enalapril-hydrochlorothiazide] Rash 12/02/2019   Past Medical History:  Diagnosis Date   Colon polyp    Diabetes mellitus without complication (HCC)    Diabetic neuropathy (HCC)    HTN (hypertension)    Patella fracture    Renal calculi     Family History: Diabetes(Mother, brother) HTN(brother)   Social History: Lives with wife[facility] [place]. Ambulates short distance with walker. Has an aide to hel with ADLs. Wife helps with iADLs. Drinks socially, does not smoke. Occasionally smokes THC  Review of Systems: A complete ROS was negative except as per HPI.   Physical Exam: Blood pressure 121/70, pulse (!) 55, temperature 98.7 F (37.1 C), temperature source Oral, resp. rate 16, height '5\' 11"'$  (1.803 m), weight 77.1 kg, SpO2 99 %. Physical Exam Constitutional:      Comments: Sleeping, awakens to touch, appears in pain  HENT:     Head: Normocephalic and atraumatic.  Eyes:     Extraocular Movements: Extraocular movements intact.     Pupils: Pupils are equal, round, and reactive to light.  Cardiovascular:     Rate and Rhythm: Normal rate and regular rhythm.  Pulmonary:     Effort: Pulmonary effort is normal.     Breath sounds: No rhonchi or rales.  Abdominal:     General: Abdomen is flat. Bowel sounds are normal.     Palpations: Abdomen is soft.  Musculoskeletal:  Right lower leg: No edema.     Left lower leg: No edema.  Skin:    Comments: Ulceration and drainage ofr scrotum see photos, no intertriginous changes   Neurological:     General: No focal deficit present.     Mental Status: He is oriented to person, place, and time.     Comments: Paraplegic, lethargic       CXR: personally reviewed my interpretation is No infiltrates, effusions or pneumothorax.   Assessment & Plan by Problem: Principal Problem:   Fournier gangrene  Sepsis secondary to fournier gangrene Seminal Vesical abscess Presents for worsening drainage  of the scrotum.  Was evaluated in the ED 2 days ago and discharged on cefadroxil and fluconazole.  Scrotal ultrasound with complex fluid collection between the testicles concerning for abscess versus hematoma.  CT showed sensitive in the scrotum as well as left lower anterior pelvic wall for necrotizing infection.  Also notable for abscess.  Afebrile, blood pressure is mildly decreased.  Leukocytosis of 18.  No lactic acidosis.  Likely has this from scrotal infection.  Notably he is on SGLT 2 inhibitor which was started in the last year per his wife he was evaluated by urology and surgery and taken for incision and debridement. -Urology and surgery following.  Appreciate recommendations -Received vancomycin, cefepime, Flagyl in the ED.  Will start on Zosyn and linezolid. -Stop empagliflozin -Follow-up surgical and blood cultures. -Continue fluids -We will reassess pain as procedure -Monitor CBC.  T2DM  Last A1c 6.6 on 12/06/2021.  Home medications include Lantus 10 units nightly hypothyroidism and.  Wife is not sure about his available diabetic medications.  I have asked her to bring his home medications.  Need to stop his SGLT2 inhibitor in the setting of severe the scrotum. -CBG -SSI -Semglee 5 units nightly, will need increase if he is eating more   AKI on CKD3b Last serum creatinine of 1.4 in October and is 1.86 today.  This is likely in the setting of poor p.o. intake. 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 Self caths but does have overflow as well as occasional stool incontinence which may have worsened his scrotal infection. -Continue dantrolene for spasms  -Oxybutynin 7.5 mg twice daily  HTN On losartan at home. BP has been low normal likley due to sepsis -Hold losartan  Iron deficiency anemia On ferrous sulfate gluconate at home.  Hemoglobin of 12 with microcytosis on CBC.  No overt bleeding. -Monitor CBC -Hold  iron in setting of acute infection  History of CVA Wife reports right sided deficits and thinks he may be leaning more to the left lately.  Unclear if this is chronic since his prior stroke or new symptoms.  Was very lethargic upon admission but otherwise did not have focal abnormalities we will continue to monitor this after his surgery.  Memory changes Wife has noticed that patient is more forgetful and how has to manage his medications.  She often supervises with activities such as using the stove.  It is possible that patient is developing dementia versus worsening mood disorder.  We will need outpatient follow-up regarding this.  PTSD Depression On sertraline 100 mg daily.  Wife feels that he has been more agitated and feels his depression and PTSD since symptoms are worse lately.  This may be related to recent infection.  -Continue sertraline  Dispo: Admit patient to Inpatient with expected length of stay greater  than 2 midnights.  Signed: Iona Beard, MD 01/05/2022, 3:25 PM  Pager: [phone] After 5pm on weekdays and 1pm on weekends: On Call pager: [phone]

## 2022-01-05 NOTE — Progress Notes (Addendum)
Pharmacy Antibiotic Note  Timothy Davenport is a 71 y.o. 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  Noted AKI - SCr 1.9 on admit (baseline ~1.1-1.4).  Pt is s/p I&D of rectal abscess. Abx change to linezolid and zosyn.   Plan: Dc vanc/cefepime/flagyl Linezolid '600mg'$  IV q12 Zosyn 3.375g IV q8   Height: '5\' 11"'$  (180.3 cm) Weight: 77.1 kg (170 lb) IBW/kg (Calculated) : 75.3  Temp (24hrs), Avg:98.7 F (37.1 C), Min:98.6 F (37 C), Max:98.7 F (37.1 C)  Recent Labs  Lab 01/03/22 0941 01/05/22 0900 01/05/22 0920 01/05/22 1607  WBC 11.1* 18.0*  --   --   CREATININE 1.26* 1.86* 1.90* 1.10  LATICACIDVEN  --  1.5  --   --      Estimated Creatinine Clearance: 63.7 mL/min (by C-G formula based on SCr of 1.1 mg/dL).    Allergies  Allergen Reactions   Lisinopril Other (See Comments)    Angioedema   Enalapril-Hctz [Enalapril-Hydrochlorothiazide] Rash    Antimicrobials this admission: 11/17 vancomycin >> 11/17 11/17 cefepime >> 11/17 11/17 flagyl x 1 11/17 zosyn>> 11/17 linezolid>>  Dose adjustments this admission:   Microbiology results: 11/17 wound>> 11/17 blood>>  Onnie Boer, PharmD, [place], AAHIVP, CPP Infectious Disease Pharmacist 01/05/2022 5:07 PM

## 2022-01-06 ENCOUNTER — Encounter (HOSPITAL_COMMUNITY): Payer: Self-pay | Admitting: Urology

## 2022-01-06 DIAGNOSIS — A419 Sepsis, unspecified organism: Secondary | ICD-10-CM | POA: Diagnosis not present

## 2022-01-06 DIAGNOSIS — I10 Essential (primary) hypertension: Secondary | ICD-10-CM

## 2022-01-06 DIAGNOSIS — N493 Fournier gangrene: Secondary | ICD-10-CM | POA: Diagnosis not present

## 2022-01-06 LAB — CBC
HCT: 27.4 % — ABNORMAL LOW (ref 39.0–52.0)
Hemoglobin: 9.2 g/dL — ABNORMAL LOW (ref 13.0–17.0)
MCH: 25.4 pg — ABNORMAL LOW (ref 26.0–34.0)
MCHC: 33.6 g/dL (ref 30.0–36.0)
MCV: 75.7 fL — ABNORMAL LOW (ref 80.0–100.0)
Platelets: 153 10*3/uL (ref 150–400)
RBC: 3.62 MIL/uL — ABNORMAL LOW (ref 4.22–5.81)
RDW: 16.6 % — ABNORMAL HIGH (ref 11.5–15.5)
WBC: 14.4 10*3/uL — ABNORMAL HIGH (ref 4.0–10.5)
nRBC: 0 % (ref 0.0–0.2)

## 2022-01-06 LAB — BASIC METABOLIC PANEL
Anion gap: 10 (ref 5–15)
BUN: 31 mg/dL — ABNORMAL HIGH (ref 8–23)
CO2: 21 mmol/L — ABNORMAL LOW (ref 22–32)
Calcium: 9.4 mg/dL (ref 8.9–10.3)
Chloride: 107 mmol/L (ref 98–111)
Creatinine, Ser: 1.34 mg/dL — ABNORMAL HIGH (ref 0.61–1.24)
GFR, Estimated: 56 mL/min — ABNORMAL LOW (ref >60)
Glucose, Bld: 184 mg/dL — ABNORMAL HIGH (ref 70–99)
Potassium: 4 mmol/L (ref 3.5–5.1)
Sodium: 138 mmol/L (ref 135–145)

## 2022-01-06 LAB — GLUCOSE, CAPILLARY
Glucose-Capillary: 116 mg/dL — ABNORMAL HIGH (ref 70–99)
Glucose-Capillary: 168 mg/dL — ABNORMAL HIGH (ref 70–99)
Glucose-Capillary: 100 mg/dL — ABNORMAL HIGH (ref 70–99)
Glucose-Capillary: 114 mg/dL — ABNORMAL HIGH (ref 70–99)

## 2022-01-06 LAB — URINE CULTURE: Culture: <10000 — AB

## 2022-01-06 MED ORDER — OXYCODONE HCL 5 MG PO TABS
5.0000 mg | ORAL_TABLET | ORAL | Status: DC | PRN
  Administered 2022-01-06 – 2022-01-08 (×3): 5 mg via ORAL
  Filled 2022-01-06 (×3): qty 1

## 2022-01-06 NOTE — Anesthesia Preprocedure Evaluation (Signed)
Anesthesia Evaluation  Patient identified by MRN, date of birth, ID band Patient awake    Reviewed: Allergy & Precautions, NPO status , Patient's Chart, lab work & pertinent test results  History of Anesthesia Complications Negative for: history of anesthetic complications  Airway Mallampati: II  TM Distance: >3 FB Neck ROM: Full    Dental no notable dental hx. (+) Edentulous Lower, Edentulous Upper   Pulmonary neg pulmonary ROS   Pulmonary exam normal breath sounds clear to auscultation       Cardiovascular hypertension, Pt. on medications Normal cardiovascular exam Rhythm:Regular Rate:Normal     Neuro/Psych Paraplegia, uses wheelchair    GI/Hepatic negative GI ROS, Neg liver ROS,,,  Endo/Other  diabetes, Type 2, Insulin Dependent, Oral Hypoglycemic Agents    Renal/GU Renal diseaseCr 1.9  negative genitourinary   Musculoskeletal negative musculoskeletal ROS (+)    Abdominal   Peds  Hematology  (+) Blood dyscrasia, anemia   Anesthesia Other Findings Day of surgery medications reviewed with patient.  Reproductive/Obstetrics negative OB ROS                             Anesthesia Physical Anesthesia Plan  ASA: 3  Anesthesia Plan: General   Post-op Pain Management: Tylenol PO (pre-op)*   Induction: Intravenous  PONV Risk Score and Plan: 2 and Treatment may vary due to age or medical condition, Dexamethasone and Ondansetron  Airway Management Planned: Oral ETT  Additional Equipment: None  Intra-op Plan:   Post-operative Plan: Extubation in OR  Informed Consent: I have reviewed the patients History and Physical, chart, labs and discussed the procedure including the risks, benefits and alternatives for the proposed anesthesia with the patient or authorized representative who has indicated his/her understanding and acceptance.     Dental advisory given  Plan Discussed with:  CRNA  Anesthesia Plan Comments:        Anesthesia Quick Evaluation

## 2022-01-06 NOTE — Progress Notes (Signed)
1 Day Post-Op Subjective: No acute events overnight. Patient denies pain today  Objective: Vital signs in last 24 hours: Temp:  [97.2 F (36.2 C)-98.7 F (37.1 C)] 98.3 F (36.8 C) (11/18 0615) Pulse Rate:  [55-109] 109 (11/18 0615) Resp:  [12-21] 17 (11/18 0615) BP: (97-131)/(61-77) 131/77 (11/18 0615) SpO2:  [94 %-100 %] 100 % (11/18 0615) Weight:  [77.1 kg-81.6 kg] 77.1 kg (11/17 1145)  Intake/Output from previous day: 11/17 0701 - 11/18 0700 In: 5169.2 [I.V.:2469.7; IV Piggyback:2699.5] Out: 1300 [Urine:1000; Blood:300] Intake/Output this shift: No intake/output data recorded.  Physical Exam:  General: Alert and oriented CV: RRR Lungs: Clear Abdomen: Soft, ND, ATTP; I took down the packing; there is fibrinous exudate spanning the wound Ext: NT, No erythema  Lab Results: Recent Labs    01/05/22 0920 01/05/22 1607 01/06/22 0206  HGB 14.3 11.6* 9.2*  HCT 42.0 34.0* 27.4*   BMET Recent Labs    01/05/22 0900 01/05/22 0920 01/05/22 1607 01/06/22 0206  NA 137   < > 139 138  K 4.1   < > 3.9 4.0  CL 100   < > 109 107  CO2 23  --   --  21*  GLUCOSE 164*   < > 126* 184*  BUN 46*   < > 33* 31*  CREATININE 1.86*   < > 1.10 1.34*  CALCIUM 10.5*  --   --  9.4   < > = values in this interval not displayed.     Studies/Results: CT ABDOMEN PELVIS W CONTRAST  Result Date: 01/05/2022 CLINICAL DATA:  Scrotal pain with purulent drainage. Concern for Fournier's gangrene. EXAM: CT ABDOMEN AND PELVIS WITH CONTRAST TECHNIQUE: Multidetector CT imaging of the abdomen and pelvis was performed using the standard protocol following bolus administration of intravenous contrast. RADIATION DOSE REDUCTION: This exam was performed according to the departmental dose-optimization program which includes automated exposure control, adjustment of the mA and/or kV according to patient size and/or use of iterative reconstruction technique. CONTRAST:  77m OMNIPAQUE IOHEXOL 350 MG/ML SOLN  COMPARISON:  Scrotal ultrasound 2 days prior, CT abdomen/pelvis 04/24/2021 FINDINGS: Lower chest: The lung bases are clear. Coronary artery calcifications and aortic valve calcifications are noted. Hepatobiliary: Numerous hypodense lesions throughout the liver are similar to the prior study, likely benign cysts for which no specific imaging follow-up is required. The gallbladder is unremarkable. There is no biliary ductal dilatation. Pancreas: Unremarkable. Spleen: Unremarkable. Adrenals/Urinary Tract: The adrenals are unremarkable. There is a 7 mm nonobstructing right lower pole stone. Small hypodense lesions in the kidneys most likely reflect benign cysts for which no specific imaging follow-up is required. There is no hydronephrosis or hydroureter. There is some excretion of contrast into the collecting systems on the delayed images. There is a small amount of gas in the bladder. Stomach/Bowel: The stomach is unremarkable. There is no evidence of bowel obstruction. There is no abnormal bowel wall thickening or inflammatory change. There is colonic diverticulosis without evidence of acute diverticulitis. The appendix is normal. Vascular/Lymphatic: There is calcified plaque throughout the nonaneurysmal abdominal aorta. The major branch vessels are patent. The main portal and splenic veins are patent. There is no abdominal or pelvic lymphadenopathy. Reproductive: There is a 3.8 cm x 2.9 cm by 4.4 cm peripherally enhancing collection in the region of the right seminal vesicle superior to the prostate. There may also be a 1.0 cm abscess in the right aspect of the prostate (3-89). There is extensive gas tracking from the scrotum on the  left into the soft tissues of the lower anterior pelvic wall. There is coarse calcification in the right scrotum. Other: There is no ascites or free intraperitoneal air. Musculoskeletal: There is no acute osseous abnormality or suspicious osseous lesion. IMPRESSION: 1. Extensive gas  tracking from the scrotum on the left into the soft tissues of the lower anterior pelvic wall highly concerning for necrotizing infection/Fournier's gangrene. 2. 3.8 cm x 2.9 cm x 4.4 cm peripherally enhancing collection in the region of the right seminal vesicle superior to the prostate concerning for abscess, and additional possible small 1.0 cm prostatic abscess. 3. Small amount of gas in the bladder may be related to recent instrumentation or spread of infection. 4. 7 mm nonobstructing right lower pole renal stone. Critical Value/emergent results were called by telephone at the time of interpretation on 01/05/2022 at 10:06 am to provider [facility] , who verbally acknowledged these results. Electronically Signed   By: Valetta Mole M.D.   On: 01/05/2022 10:11   DG Chest Portable 1 View  Result Date: 01/05/2022 CLINICAL DATA:  Altered mental status EXAM: PORTABLE CHEST 1 VIEW COMPARISON:  CXR 07/09/19 FINDINGS: No pleural effusion. No pneumothorax. No displaced rib fracture. Normal cardiac and mediastinal contours. Visualized upper abdomen is unremarkable. Degenerative changes of the right AC and glenohumeral joint. IMPRESSION: No focal airspace opacity. Electronically Signed   By: Marin Roberts M.D.   On: 01/05/2022 08:50    Assessment/Plan: Timothy Davenport is POD 1 s/p debridement of Fournier's gangrene  --continue foley --wound care consulted --dressing changes - wet to dry's - for now --plan for OR tomorrow for second debridement   LOS: 1 day   Donald Pore MD 01/06/2022, [facility]  Pager: [phone]

## 2022-01-06 NOTE — Progress Notes (Signed)
HD#1 SUBJECTIVE:  Patient Summary: Timothy Davenport 71 year T2DM on insulin, paraplegia T8 spinal cord injury, neurogenic bladder, CKD 3, HLD, prior CVA who presented for worsening left scrotal infection admitted with Fournier's Gangrene, now POD1 from extensive I&D of gangrenous tissue.  Overnight Events: POD1 I&D Fourniers Gangrene  Interim History: Mr. Kolodziejski is doing okay this morning, denies experiencing any pain at this time. He mentions that the mittens that he has on are preventing him from tending to his wounds and I explained that we have wound dressings over the wounds to absorb any fluid or blood for him, which he voiced understanding to. He does endorse some increased confusion and memory decrease lately but does not necessarily think it is significant.  OBJECTIVE:  Vital Signs: Vitals:   01/05/22 1715 01/05/22 1747 01/05/22 2015 01/06/22 0615  BP: 101/68 101/76 104/73 131/77  Pulse: 70  82 (!) 109  Resp: (!) '21  16 17  '$ Temp: (!) 97.2 F (36.2 C) (!) 97.4 F (36.3 C) 98.1 F (36.7 C) 98.3 F (36.8 C)  TempSrc:  Oral Oral Oral  SpO2: 100% 100% 100% 100%  Weight:      Height:       Supplemental O2: Room Air SpO2: 100 % O2 Flow Rate (L/min): 2 L/min  Filed Weights   01/05/22 0835 01/05/22 1145  Weight: 81.6 kg 77.1 kg     Intake/Output Summary (Last 24 hours) at 01/06/2022 0702 Last data filed at 01/06/2022 0547 Gross per 24 hour  Intake 5169.2 ml  Output 1300 ml  Net 3869.2 ml   Net IO Since Admission: 3,869.2 mL [01/06/22 0702]  Physical Exam: Constitutional:Sleepy but resting comfortably. In no acute distress. Cardio:Regular rate and rhythm. No murmurs, rubs, or gallops. Pulm:Clear to auscultation bilaterally. Normal work of breathing on room air. WLN:LGXQJJHE for extremity edema. Skin:Warm and dry. Wound dressing is over the scrotum and L inguinal canal with some dried blood on the dressing but no saturation or leakage around the dressing. No hematoma  outside of covered area appreciated. Neuro:Alert and oriented to self, location, and year. No focal deficit noted. Safety mits over bilateral hands. Psych:Pleasant mood and affect.  Patient Lines/Drains/Airways Status     Active Line/Drains/Airways     Name Placement date Placement time Site Days   Peripheral IV 18 G Left Antecubital --  --  Antecubital  --   Peripheral IV 01/05/22 18 G Right Antecubital 01/05/22  0910  Antecubital  1   Urethral Catheter Dr. Cain Sieve, MD Latex 16 Fr. 01/05/22  1501  Latex  1   Incision (Closed) 07/01/19 Back 07/01/19  1630  -- 920   Incision (Closed) 07/03/19 Back Other (Comment) 07/03/19  1129  -- 918   Incision (Closed) 01/05/22 Groin Other (Comment) 01/05/22  1638  -- 1             ASSESSMENT/PLAN:  Assessment: Principal Problem:   Fournier gangrene   Plan: Sepsis secondary to fournier gangrene Seminal Vesical abscess POD1 from extensive incision and drainage of scrotum into left inguinal canal. The extent of tissue requiring debridement included the scrotum, spermatic cord, and into the left inguinal canal. WBC improved 18.0>14.4. AF, hemodynamically stable. Surgical culture reincubated for better growth but showed moderate GN rods and rare GP rods. Blood culture shows no growth at 24h. -Urology and surgery following, appreciate their assistance  -BID wound packing changes  -Repeat I&D tomorrow, 11/19; NPO at midnight -Continue zosyn, linezolid -Follow surgical and blood cultures -Trend  CBC   T2DM Holding home SGLT2 inhibitor in the setting of infection. -Trend CBG -Continue semglee 5 units nightly, SSI   AKI on CKD3b, resolved Resolved, and AKI improved with serum creatinine now 1.34 from presenting lab of 1.86, approaching his apparent baseline of closer to 1.20.  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  -Continue dantrolene for spasms  -Oxybutynin 7.5 mg twice daily    HTN Normotensive at this time. Home regimen includes losartan.  -Continue to hold losartan at this time.    Iron deficiency anemia Hgb decreased post-operatively to 9.2; no large hematoma appreciated on exam. -Trend CBC -Continue to hold home ferrous sulfate in setting of ongoing infection requiring further debridement   History of CVA Memory changes Unclear if memory changes reported by his wife are chronic in nature related to CVA history or underlying dementia. He is oriented to self, year, place and does not appear to have delirium. He does state incorrect month however when corrected is able to describe upcoming holidays. He remains sleepy but engaged in conversation.    PTSD Depression -Continue home sertraline  Best Practice: Diet: Diabetic diet IVF: None VTE: SCDs Start: 01/05/22 1312 Code: Full AB: Zosyn, linezolid Family Contact: Hazel, called and notified. DISPO: Anticipated discharge pending IV antibiotics, Wound care, and medical stability .  Signature: Farrel Gordon, D.O.  Internal Medicine Resident, PGY-2 [facility]  Pager: [phone]   Please contact the on call pager after 5 pm and on weekends at [phone].

## 2022-01-06 NOTE — Progress Notes (Signed)
1 Day Post-Op  Subjective: Afebrile, WBC downtrending.   Objective: Vital signs in last 24 hours: Temp:  [97.2 F (36.2 C)-98.7 F (37.1 C)] 98 F (36.7 C) (11/18 1546) Pulse Rate:  [55-109] 70 (11/18 1546) Resp:  [16-21] 17 (11/18 1546) BP: (96-131)/(64-90) 117/90 (11/18 1546) SpO2:  [94 %-100 %] 100 % (11/18 1546) Last BM Date :  (PTA)  Intake/Output from previous day: 11/17 0701 - 11/18 0700 In: 5169.2 [I.V.:2469.7; IV Piggyback:2699.5] Out: 1300 [Urine:1000; Blood:300] Intake/Output this shift: No intake/output data recorded.  PE: General: NAD Resp: normal work of breathing Abd/GU: clean dressings in place over right groin and perineum. Foley draining clear urine.  Lab Results:  Recent Labs    01/05/22 0900 01/05/22 0920 01/05/22 1607 01/06/22 0206  WBC 18.0*  --   --  14.4*  HGB 12.8*   < > 11.6* 9.2*  HCT 40.0   < > 34.0* 27.4*  PLT 204  --   --  153   < > = values in this interval not displayed.   BMET Recent Labs    01/05/22 0900 01/05/22 0920 01/05/22 1607 01/06/22 0206  NA 137   < > 139 138  K 4.1   < > 3.9 4.0  CL 100   < > 109 107  CO2 23  --   --  21*  GLUCOSE 164*   < > 126* 184*  BUN 46*   < > 33* 31*  CREATININE 1.86*   < > 1.10 1.34*  CALCIUM 10.5*  --   --  9.4   < > = values in this interval not displayed.   PT/INR No results for input(s): "LABPROT", "INR" in the last 72 hours. CMP     Component Value Date/Time   NA 138 01/06/2022 0206   NA 137 03/08/2021 1137   K 4.0 01/06/2022 0206   CL 107 01/06/2022 0206   CO2 21 (L) 01/06/2022 0206   GLUCOSE 184 (H) 01/06/2022 0206   BUN 31 (H) 01/06/2022 0206   BUN 17 03/08/2021 1137   CREATININE 1.34 (H) 01/06/2022 0206   CALCIUM 9.4 01/06/2022 0206   PROT 7.7 01/05/2022 0900   PROT 7.9 03/08/2021 1137   ALBUMIN 2.6 (L) 01/05/2022 0900   ALBUMIN 3.9 03/08/2021 1137   AST 16 01/05/2022 0900   ALT 12 01/05/2022 0900   ALKPHOS 71 01/05/2022 0900   BILITOT 0.6 01/05/2022 0900    BILITOT 0.2 03/08/2021 1137   GFRNONAA 56 (L) 01/06/2022 0206   GFRAA >60 07/20/2019 0550   Lipase  No results found for: "LIPASE"     Studies/Results: CT ABDOMEN PELVIS W CONTRAST  Result Date: 01/05/2022 CLINICAL DATA:  Scrotal pain with purulent drainage. Concern for Fournier's gangrene. EXAM: CT ABDOMEN AND PELVIS WITH CONTRAST TECHNIQUE: Multidetector CT imaging of the abdomen and pelvis was performed using the standard protocol following bolus administration of intravenous contrast. RADIATION DOSE REDUCTION: This exam was performed according to the departmental dose-optimization program which includes automated exposure control, adjustment of the mA and/or kV according to patient size and/or use of iterative reconstruction technique. CONTRAST:  59m OMNIPAQUE IOHEXOL 350 MG/ML SOLN COMPARISON:  Scrotal ultrasound 2 days prior, CT abdomen/pelvis 04/24/2021 FINDINGS: Lower chest: The lung bases are clear. Coronary artery calcifications and aortic valve calcifications are noted. Hepatobiliary: Numerous hypodense lesions throughout the liver are similar to the prior study, likely benign cysts for which no specific imaging follow-up is required. The gallbladder is unremarkable. There is  no biliary ductal dilatation. Pancreas: Unremarkable. Spleen: Unremarkable. Adrenals/Urinary Tract: The adrenals are unremarkable. There is a 7 mm nonobstructing right lower pole stone. Small hypodense lesions in the kidneys most likely reflect benign cysts for which no specific imaging follow-up is required. There is no hydronephrosis or hydroureter. There is some excretion of contrast into the collecting systems on the delayed images. There is a small amount of gas in the bladder. Stomach/Bowel: The stomach is unremarkable. There is no evidence of bowel obstruction. There is no abnormal bowel wall thickening or inflammatory change. There is colonic diverticulosis without evidence of acute diverticulitis. The  appendix is normal. Vascular/Lymphatic: There is calcified plaque throughout the nonaneurysmal abdominal aorta. The major branch vessels are patent. The main portal and splenic veins are patent. There is no abdominal or pelvic lymphadenopathy. Reproductive: There is a 3.8 cm x 2.9 cm by 4.4 cm peripherally enhancing collection in the region of the right seminal vesicle superior to the prostate. There may also be a 1.0 cm abscess in the right aspect of the prostate (3-89). There is extensive gas tracking from the scrotum on the left into the soft tissues of the lower anterior pelvic wall. There is coarse calcification in the right scrotum. Other: There is no ascites or free intraperitoneal air. Musculoskeletal: There is no acute osseous abnormality or suspicious osseous lesion. IMPRESSION: 1. Extensive gas tracking from the scrotum on the left into the soft tissues of the lower anterior pelvic wall highly concerning for necrotizing infection/Fournier's gangrene. 2. 3.8 cm x 2.9 cm x 4.4 cm peripherally enhancing collection in the region of the right seminal vesicle superior to the prostate concerning for abscess, and additional possible small 1.0 cm prostatic abscess. 3. Small amount of gas in the bladder may be related to recent instrumentation or spread of infection. 4. 7 mm nonobstructing right lower pole renal stone. Critical Value/emergent results were called by telephone at the time of interpretation on 01/05/2022 at 10:06 am to provider [facility] , who verbally acknowledged these results. Electronically Signed   By: Valetta Mole M.D.   On: 01/05/2022 10:11   DG Chest Portable 1 View  Result Date: 01/05/2022 CLINICAL DATA:  Altered mental status EXAM: PORTABLE CHEST 1 VIEW COMPARISON:  CXR 07/09/19 FINDINGS: No pleural effusion. No pneumothorax. No displaced rib fracture. Normal cardiac and mediastinal contours. Visualized upper abdomen is unremarkable. Degenerative changes of the right AC and  glenohumeral joint. IMPRESSION: No focal airspace opacity. Electronically Signed   By: Marin Roberts M.D.   On: 01/05/2022 08:50    Anti-infectives: Anti-infectives (From admission, onward)    Start     Dose/Rate Route Frequency Ordered Stop   01/06/22 1100  vancomycin (VANCOCIN) IVPB 1000 mg/200 mL premix  Status:  Discontinued        1,000 mg 200 mL/hr over 60 Minutes Intravenous Every 24 hours 01/05/22 0946 01/05/22 0949   01/06/22 0000  vancomycin variable dose per unstable renal function (pharmacist dosing)  Status:  Discontinued         Does not apply See admin instructions 01/05/22 0949 01/05/22 1514   01/05/22 2200  ceFEPIme (MAXIPIME) 2 g in sodium chloride 0.9 % 100 mL IVPB  Status:  Discontinued        2 g 200 mL/hr over 30 Minutes Intravenous Every 12 hours 01/05/22 0946 01/05/22 1514   01/05/22 2000  piperacillin-tazobactam (ZOSYN) IVPB 3.375 g        3.375 g 12.5 mL/hr over 240 Minutes Intravenous  Every 8 hours 01/05/22 1711     01/05/22 1515  linezolid (ZYVOX) IVPB 600 mg        600 mg 300 mL/hr over 60 Minutes Intravenous Every 12 hours 01/05/22 1514 01/09/25 0959   01/05/22 0945  ceFEPIme (MAXIPIME) 2 g in sodium chloride 0.9 % 100 mL IVPB        2 g 200 mL/hr over 30 Minutes Intravenous  Once 01/05/22 0932 01/05/22 1038   01/05/22 0945  metroNIDAZOLE (FLAGYL) IVPB 500 mg        500 mg 100 mL/hr over 60 Minutes Intravenous  Once 01/05/22 0932 01/05/22 1103   01/05/22 0945  vancomycin (VANCOCIN) IVPB 1000 mg/200 mL premix  Status:  Discontinued        1,000 mg 200 mL/hr over 60 Minutes Intravenous  Once 01/05/22 0932 01/05/22 0944   01/05/22 0945  vancomycin (VANCOREADY) IVPB 2000 mg/400 mL        2,000 mg 200 mL/hr over 120 Minutes Intravenous  Once 01/05/22 0944 01/05/22 1256        Assessment/Plan 71 yo male with 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 POD1 s/p debridement. - Wet-to-dry dressings - Broad-spectrum  antibiotics - Return to OR tomorrow as joint case with urology for additional debridement. - General surgery will follow   LOS: 1 day    Michaelle Birks, [facility], Hepatobiliary and Pancreatic Surgery 01/06/22 4:30 PM

## 2022-01-07 ENCOUNTER — Inpatient Hospital Stay (HOSPITAL_COMMUNITY): Payer: Medicare Other | Admitting: Anesthesiology

## 2022-01-07 ENCOUNTER — Encounter (HOSPITAL_COMMUNITY): Payer: Self-pay | Admitting: Internal Medicine

## 2022-01-07 ENCOUNTER — Encounter (HOSPITAL_COMMUNITY)
Admission: EM | Disposition: A | Discharge status: Nursing Home (Includes ICF) | Payer: Self-pay | Source: Home / Self Care | Attending: Internal Medicine

## 2022-01-07 ENCOUNTER — Other Ambulatory Visit: Payer: Self-pay

## 2022-01-07 DIAGNOSIS — A419 Sepsis, unspecified organism: Secondary | ICD-10-CM | POA: Diagnosis not present

## 2022-01-07 DIAGNOSIS — E119 Type 2 diabetes mellitus without complications: Secondary | ICD-10-CM

## 2022-01-07 DIAGNOSIS — I10 Essential (primary) hypertension: Secondary | ICD-10-CM | POA: Diagnosis not present

## 2022-01-07 DIAGNOSIS — Z794 Long term (current) use of insulin: Secondary | ICD-10-CM | POA: Diagnosis not present

## 2022-01-07 DIAGNOSIS — N493 Fournier gangrene: Secondary | ICD-10-CM

## 2022-01-07 DIAGNOSIS — Z7984 Long term (current) use of oral hypoglycemic drugs: Secondary | ICD-10-CM

## 2022-01-07 HISTORY — PX: SCROTAL EXPLORATION: SHX2386

## 2022-01-07 HISTORY — PX: WOUND DEBRIDEMENT: SHX247

## 2022-01-07 LAB — POCT I-STAT, CHEM 8
BUN: 28 mg/dL — ABNORMAL HIGH (ref 8–23)
Calcium, Ion: 1.35 mmol/L (ref 1.15–1.40)
Chloride: 106 mmol/L (ref 98–111)
Creatinine, Ser: 1.1 mg/dL (ref 0.61–1.24)
Glucose, Bld: 146 mg/dL — ABNORMAL HIGH (ref 70–99)
HCT: 30 % — ABNORMAL LOW (ref 39.0–52.0)
Hemoglobin: 10.2 g/dL — ABNORMAL LOW (ref 13.0–17.0)
Potassium: 4.7 mmol/L (ref 3.5–5.1)
Sodium: 141 mmol/L (ref 135–145)
TCO2: 25 mmol/L (ref 22–32)

## 2022-01-07 LAB — CBC
HCT: 29.3 % — ABNORMAL LOW (ref 39.0–52.0)
Hemoglobin: 9.8 g/dL — ABNORMAL LOW (ref 13.0–17.0)
MCH: 25.7 pg — ABNORMAL LOW (ref 26.0–34.0)
MCHC: 33.4 g/dL (ref 30.0–36.0)
MCV: 76.9 fL — ABNORMAL LOW (ref 80.0–100.0)
Platelets: 188 10*3/uL (ref 150–400)
RBC: 3.81 MIL/uL — ABNORMAL LOW (ref 4.22–5.81)
RDW: 16.9 % — ABNORMAL HIGH (ref 11.5–15.5)
WBC: 15.4 10*3/uL — ABNORMAL HIGH (ref 4.0–10.5)
nRBC: 0 % (ref 0.0–0.2)

## 2022-01-07 LAB — GLUCOSE, CAPILLARY
Glucose-Capillary: 135 mg/dL — ABNORMAL HIGH (ref 70–99)
Glucose-Capillary: 148 mg/dL — ABNORMAL HIGH (ref 70–99)
Glucose-Capillary: 170 mg/dL — ABNORMAL HIGH (ref 70–99)
Glucose-Capillary: 157 mg/dL — ABNORMAL HIGH (ref 70–99)

## 2022-01-07 LAB — BASIC METABOLIC PANEL
Anion gap: 13 (ref 5–15)
BUN: 22 mg/dL (ref 8–23)
CO2: 21 mmol/L — ABNORMAL LOW (ref 22–32)
Calcium: 9.7 mg/dL (ref 8.9–10.3)
Chloride: 109 mmol/L (ref 98–111)
Creatinine, Ser: 1.17 mg/dL (ref 0.61–1.24)
GFR, Estimated: >60 mL/min (ref >60)
Glucose, Bld: 153 mg/dL — ABNORMAL HIGH (ref 70–99)
Potassium: 4.6 mmol/L (ref 3.5–5.1)
Sodium: 143 mmol/L (ref 135–145)

## 2022-01-07 SURGERY — EXPLORATION, SCROTUM
Anesthesia: General | Site: Groin

## 2022-01-07 MED ORDER — INSULIN ASPART 100 UNIT/ML IJ SOLN: 0.0000 [IU] | INTRAMUSCULAR | Status: DC | PRN

## 2022-01-07 MED ORDER — DEXAMETHASONE SODIUM PHOSPHATE 10 MG/ML IJ SOLN
INTRAMUSCULAR | Status: AC
  Filled 2022-01-07: qty 1

## 2022-01-07 MED ORDER — ACETAMINOPHEN 500 MG PO TABS: 1000.0000 mg | ORAL_TABLET | Freq: Once | ORAL | Status: AC

## 2022-01-07 MED ORDER — ORAL CARE MOUTH RINSE: 15.0000 mL | Freq: Once | OROMUCOSAL | Status: AC

## 2022-01-07 MED ORDER — 0.9 % SODIUM CHLORIDE (POUR BTL) OPTIME
TOPICAL | Status: DC | PRN
  Administered 2022-01-07: 1000 mL

## 2022-01-07 MED ORDER — PROPOFOL 10 MG/ML IV BOLUS
INTRAVENOUS | Status: AC
  Filled 2022-01-07: qty 20

## 2022-01-07 MED ORDER — PROMETHAZINE HCL 25 MG/ML IJ SOLN: 6.2500 mg | INTRAMUSCULAR | Status: DC | PRN

## 2022-01-07 MED ORDER — LIDOCAINE 2% (20 MG/ML) 5 ML SYRINGE
INTRAMUSCULAR | Status: AC
  Filled 2022-01-07: qty 5

## 2022-01-07 MED ORDER — ROCURONIUM BROMIDE 10 MG/ML (PF) SYRINGE
PREFILLED_SYRINGE | INTRAVENOUS | Status: DC | PRN
  Administered 2022-01-07: 50 mg via INTRAVENOUS

## 2022-01-07 MED ORDER — SODIUM CHLORIDE 0.9 % IV SOLN: INTRAVENOUS | Status: DC

## 2022-01-07 MED ORDER — PHENYLEPHRINE 80 MCG/ML (10ML) SYRINGE FOR IV PUSH (FOR BLOOD PRESSURE SUPPORT)
PREFILLED_SYRINGE | INTRAVENOUS | Status: DC | PRN
  Administered 2022-01-07 (×2): 80 ug via INTRAVENOUS
  Administered 2022-01-07: 60 ug via INTRAVENOUS
  Administered 2022-01-07: 80 ug via INTRAVENOUS

## 2022-01-07 MED ORDER — FENTANYL CITRATE (PF) 250 MCG/5ML IJ SOLN
INTRAMUSCULAR | Status: AC
  Filled 2022-01-07: qty 5

## 2022-01-07 MED ORDER — ACETAMINOPHEN 500 MG PO TABS
ORAL_TABLET | ORAL | Status: AC
  Administered 2022-01-07: 1000 mg via ORAL
  Filled 2022-01-07: qty 2

## 2022-01-07 MED ORDER — CHLORHEXIDINE GLUCONATE 0.12 % MT SOLN: 15.0000 mL | Freq: Once | OROMUCOSAL | Status: AC

## 2022-01-07 MED ORDER — PHENYLEPHRINE HCL-NACL 20-0.9 MG/250ML-% IV SOLN
INTRAVENOUS | Status: DC | PRN
  Administered 2022-01-07: 30 ug/min via INTRAVENOUS

## 2022-01-07 MED ORDER — PHENYLEPHRINE 80 MCG/ML (10ML) SYRINGE FOR IV PUSH (FOR BLOOD PRESSURE SUPPORT)
PREFILLED_SYRINGE | INTRAVENOUS | Status: AC
  Filled 2022-01-07: qty 10

## 2022-01-07 MED ORDER — HYDROMORPHONE HCL 1 MG/ML IJ SOLN: 0.2500 mg | INTRAMUSCULAR | Status: DC | PRN

## 2022-01-07 MED ORDER — LIDOCAINE 2% (20 MG/ML) 5 ML SYRINGE
INTRAMUSCULAR | Status: DC | PRN
  Administered 2022-01-07: 80 mg via INTRAVENOUS

## 2022-01-07 MED ORDER — FENTANYL CITRATE (PF) 250 MCG/5ML IJ SOLN
INTRAMUSCULAR | Status: DC | PRN
  Administered 2022-01-07: 100 ug via INTRAVENOUS
  Administered 2022-01-07: 25 ug via INTRAVENOUS

## 2022-01-07 MED ORDER — SUGAMMADEX SODIUM 200 MG/2ML IV SOLN
INTRAVENOUS | Status: DC | PRN
  Administered 2022-01-07: 200 mg via INTRAVENOUS

## 2022-01-07 MED ORDER — ROCURONIUM BROMIDE 10 MG/ML (PF) SYRINGE
PREFILLED_SYRINGE | INTRAVENOUS | Status: AC
  Filled 2022-01-07: qty 10

## 2022-01-07 MED ORDER — CHLORHEXIDINE GLUCONATE 0.12 % MT SOLN
OROMUCOSAL | Status: AC
  Administered 2022-01-07: 15 mL via OROMUCOSAL
  Filled 2022-01-07: qty 15

## 2022-01-07 MED ORDER — ONDANSETRON HCL 4 MG/2ML IJ SOLN
INTRAMUSCULAR | Status: DC | PRN
  Administered 2022-01-07: 4 mg via INTRAVENOUS

## 2022-01-07 MED ORDER — PROPOFOL 10 MG/ML IV BOLUS
INTRAVENOUS | Status: DC | PRN
  Administered 2022-01-07: 50 mg via INTRAVENOUS

## 2022-01-07 MED ORDER — PIPERACILLIN-TAZOBACTAM 3.375 G IVPB
3.3750 g | Freq: Three times a day (TID) | INTRAVENOUS | Status: DC
  Administered 2022-01-07 – 2022-01-08 (×3): 3.375 g via INTRAVENOUS
  Filled 2022-01-07 (×3): qty 50

## 2022-01-07 MED ORDER — DEXAMETHASONE SODIUM PHOSPHATE 10 MG/ML IJ SOLN
INTRAMUSCULAR | Status: DC | PRN
  Administered 2022-01-07: 5 mg via INTRAVENOUS

## 2022-01-07 SURGICAL SUPPLY — 32 items
BAG COUNTER SPONGE SURGICOUNT (BAG) ×1 IMPLANT
BAG SPNG CNTER NS LX DISP (BAG) ×1
BAG URO CATCHER STRL LF (MISCELLANEOUS) IMPLANT
BLADE SURG 15 STRL LF DISP TIS (BLADE) ×2 IMPLANT
BLADE SURG 15 STRL SS (BLADE) ×2
BNDG GAUZE DERMACEA FLUFF 4 (GAUZE/BANDAGES/DRESSINGS) IMPLANT
BNDG GZE DERMACEA 4 6PLY (GAUZE/BANDAGES/DRESSINGS) ×3
CANISTER SUCT 3000ML PPV (MISCELLANEOUS) ×1 IMPLANT
CNTNR URN SCR LID CUP LEK RST (MISCELLANEOUS) ×1 IMPLANT
CONT SPEC 4OZ STRL OR WHT (MISCELLANEOUS) ×1
DRAPE LAPAROTOMY T 102X78X121 (DRAPES) ×1 IMPLANT
ELECT REM PT RETURN 9FT ADLT (ELECTROSURGICAL) ×1
ELECTRODE REM PT RTRN 9FT ADLT (ELECTROSURGICAL) ×1 IMPLANT
GAUZE PAD ABD 7.5X8 STRL (GAUZE/BANDAGES/DRESSINGS) IMPLANT
GAUZE PAD ABD 8X10 STRL (GAUZE/BANDAGES/DRESSINGS) ×2 IMPLANT
GAUZE SPONGE 4X4 12PLY STRL (GAUZE/BANDAGES/DRESSINGS) ×1 IMPLANT
GLOVE SURG ORTHO 8.0 STRL STRW (GLOVE) ×1 IMPLANT
KIT BASIN OR (CUSTOM PROCEDURE TRAY) ×1 IMPLANT
KIT TURNOVER KIT B (KITS) ×1 IMPLANT
NS IRRIG 1000ML POUR BTL (IV SOLUTION) ×1 IMPLANT
PACK GENERAL/GYN (CUSTOM PROCEDURE TRAY) ×1 IMPLANT
PAD ARMBOARD 7.5X6 YLW CONV (MISCELLANEOUS) ×2 IMPLANT
SOL PREP POV-IOD 4OZ 10% (MISCELLANEOUS) ×1 IMPLANT
SPONGE T-LAP 18X18 ~~LOC~~+RFID (SPONGE) IMPLANT
SUPPORT SCROTAL MEDIUM (SOFTGOODS) IMPLANT
SUPPORT SCROTAL SMALL (MISCELLANEOUS) IMPLANT
SUT CHROMIC 3 0 SH 27 (SUTURE) ×1 IMPLANT
SUT PROLENE 4 0 PS 2 18 (SUTURE) ×1 IMPLANT
SUT VICRYL 4-0 PS2 18IN ABS (SUTURE) ×1 IMPLANT
SWAB COLLECTION DEVICE MRSA (MISCELLANEOUS) ×1 IMPLANT
TOWEL GREEN STERILE (TOWEL DISPOSABLE) ×1 IMPLANT
WATER STERILE IRR 1000ML POUR (IV SOLUTION) ×1 IMPLANT

## 2022-01-07 NOTE — Transfer of Care (Signed)
Immediate Anesthesia Transfer of Care Note  Patient: Timothy Davenport  Procedure(s) Performed: DEBRIDEMENT OF PENIS, SCROTUM, TESTICLES, AND GROIN (Groin) DEBRIDEMENT OF GROIN AND ABDOMINAL WALL (Groin)  Patient Location: PACU  Anesthesia Type:General  Level of Consciousness: awake, alert , and oriented  Airway & Oxygen Therapy: Patient Spontanous Breathing  Post-op Assessment: Report given to RN and Post -op Vital signs reviewed and stable  Post vital signs: Reviewed and stable  Last Vitals:  Vitals Value Taken Time  BP 127/76 01/07/22 0957  Temp    Pulse 65 01/07/22 0958  Resp 20 01/07/22 0958  SpO2 100 % 01/07/22 0958  Vitals shown include unvalidated device data.  Last Pain:  Vitals:   01/07/22 0743  TempSrc: Oral  PainSc: 0-No pain      Patients Stated Pain Goal: 0 (27/06/23 7628)  Complications: No notable events documented.

## 2022-01-07 NOTE — Op Note (Addendum)
Preoperative diagnosis:  1. Fournier's gangrene 2. History of Neurogenic Bladder 3. DM  Postoperative diagnosis: 1. same  Procedure(s): 1. Second look debridement of GU and abdominal wall necrotising fasciitis  Surgeons: Dr. Donald Pore Dr Michaelle Birks (gen surg)  Anesthesia: general  Complications: none  EBL: 50 cc  Specimens: none  Disposition of specimens: n/a  Intraoperative findings: Penile skin non-viable, nonviable areas of abdominal wall and scrotal skin debrided   Description of procedure:  With appropriate consent having been obtained the patient was brought to the operative suite. Anesthesia was induced and the patient was prepped and draped in the usual sterile fashion.  A timeout was performed.  A  new 105 French Foley was easily placed and the balloon inflated with 10 cc   The area of previous resection was inspected. Non viable areas of the abdominal wall on the superior border of our dissection were resected. It appears the infection tracked rightwards in the mons from our previous resection. Non-viable tissue here was excised the the bovie or scalpel. There were a few areas of scrotal skin that appeared necrotic and non-viable which were removed. Lastly, the skin of the shaft of the penis was necrotic and excised. There was a small cuff of skin at the corona, and the glans were left intact.   Hemostasis was achieved with the aid of the bovie   Wound was repacked with wet-to-dry dressings  Patient tolerated the procedure well.  Plan:  --likely will need a 3rd look, which I have tentatively scheduled as an add on Tuesday --I will order imaging on the patient to assess the seminal vesicle abscess identified on admit. If it has not improved would consult IR for drainage if possible  Donald Pore MD 01/07/2022, 9:50 AM  [facility]  Pager: [phone]

## 2022-01-07 NOTE — Anesthesia Postprocedure Evaluation (Signed)
Anesthesia Post Note  Patient: Timothy Davenport  Procedure(s) Performed: DEBRIDEMENT OF PENIS, SCROTUM, TESTICLES, AND GROIN (Groin) DEBRIDEMENT OF GROIN AND ABDOMINAL WALL (Groin)     Patient location during evaluation: PACU Anesthesia Type: General Level of consciousness: sedated and patient cooperative Pain management: pain level controlled Vital Signs Assessment: post-procedure vital signs reviewed and stable Respiratory status: spontaneous breathing Cardiovascular status: stable Anesthetic complications: no   No notable events documented.  Last Vitals:  Vitals:   01/07/22 1030 01/07/22 1214  BP: 135/81 119/83  Pulse: (!) 58 76  Resp: 13 18  Temp: 36.6 C 36.9 C  SpO2: 99% 100%    Last Pain:  Vitals:   01/07/22 1226  TempSrc:   PainSc: 0-No pain                 Nolon Nations

## 2022-01-07 NOTE — Progress Notes (Signed)
   Subjective:  The patient reports he feels fine, just thirsty. Denies nausea. He has some mild pain in his groin, but no other pain or complaints.   Objective:  Vital signs in last 24 hours: Vitals:   01/07/22 0743 01/07/22 1000 01/07/22 1015 01/07/22 1030  BP: 120/82 128/75 129/83 135/81  Pulse: 74 62 67 (!) 58  Resp: '17 16 16 13  '$ Temp: (!) 97.4 F (36.3 C) (!) 97.2 F (36.2 C)  97.8 F (36.6 C)  TempSrc: Oral     SpO2: 100% 100% 100% 99%  Weight:      Height:       Weight change:   Intake/Output Summary (Last 24 hours) at 01/07/2022 1041 Last data filed at 01/07/2022 0945 Gross per 24 hour  Intake 500 ml  Output 1080 ml  Net -580 ml   General: Sleepy, but arousable; Appears to be resting comfortably in bed; No acute distress  Cardiovascular: RRR; no murmurs, rubs, or gallops; 2+ radial pulses Pulmonary: Normal respiratory effort; Lungs clear to auscultation anteriorly; no wheezing, rhonchi, or rales Extremities: No foot swelling  Skin: Warm and dry; Wound dressing over scrotum, no visible swelling, erythema, bleeding, or drainage around dressing Neuro/Psych: Cannot accurately assess at this time, the patient is mildly sedated post-surgery; Sleepy, arousable, answers questions appropriately.      Assessment/Plan:  Principal Problem:   Fournier gangrene  Sepsis secondary to fournier gangrene Seminal Vesical abscess The patient remains hemodynamically stable today. Wound culture grew Citrobacter koseri, pending sensitivities. Will continue on Zosyn and linezolid for now. Blood culture has no growth for 2 days. He received repeat I&D today, tolerated well per patient.  - Urology and surgery following, appreciate recommendations  - Continue Zosyn and Linezolid pending sensitivities  - Follow surgical and blood cultures  - Trend CBC  T2DM Holding home SGLT2 inhibitor in the setting of infection. -Trend CBG -Continue semglee 5 units nightly, SSI  AKI on CKD3b,  resolved  Creatinine 1.10 today.  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  -Continue dantrolene for spasms  -Oxybutynin 7.5 mg twice daily  HTN Normotensive at this time. Home regimen includes losartan.  -Continue to hold losartan at this time.  Iron deficiency anemia  Hgb today is 10.2, up from 9.2 yesterday post-op. The patient had another I&D today, so will continue to monitor.  -Trend CBC -Continue to hold home ferrous sulfate in setting of ongoing infection requiring further debridement  History of CVA Memory changes Recent mental status changes likely due to infection given its acute nature and worsening over the course of the infection. Will continue to monitor neuro and psych exam. Chronic memory changes will likely be addressed in the outpatient setting.   PTSD Depression -Continue home sertraline   LOS: 2 days   [name], Medical Student 01/07/2022, 10:41 AM

## 2022-01-07 NOTE — Progress Notes (Signed)
Day of Surgery  Subjective: No acute changes.   Objective: Vital signs in last 24 hours: Temp:  [97.4 F (36.3 C)-98.2 F (36.8 C)] 97.4 F (36.3 C) (11/19 0743) Pulse Rate:  [67-81] 74 (11/19 0743) Resp:  [17-18] 17 (11/19 0743) BP: (117-141)/(82-92) 120/82 (11/19 0743) SpO2:  [100 %] 100 % (11/19 0743) Last BM Date :  (PTA)  Intake/Output from previous day: 11/18 0701 - 11/19 0700 In: -  Out: 1030 [Urine:1030] Intake/Output this shift: No intake/output data recorded.  PE: General: NAD Resp: normal work of breathing Abd/GU: dressings in place, foley draining clear yellow urine.  Lab Results:  Recent Labs    01/05/22 0900 01/05/22 0920 01/05/22 1607 01/06/22 0206  WBC 18.0*  --   --  14.4*  HGB 12.8*   < > 11.6* 9.2*  HCT 40.0   < > 34.0* 27.4*  PLT 204  --   --  153   < > = values in this interval not displayed.   BMET Recent Labs    01/05/22 0900 01/05/22 0920 01/05/22 1607 01/06/22 0206  NA 137   < > 139 138  K 4.1   < > 3.9 4.0  CL 100   < > 109 107  CO2 23  --   --  21*  GLUCOSE 164*   < > 126* 184*  BUN 46*   < > 33* 31*  CREATININE 1.86*   < > 1.10 1.34*  CALCIUM 10.5*  --   --  9.4   < > = values in this interval not displayed.   PT/INR No results for input(s): "LABPROT", "INR" in the last 72 hours. CMP     Component Value Date/Time   NA 138 01/06/2022 0206   NA 137 03/08/2021 1137   K 4.0 01/06/2022 0206   CL 107 01/06/2022 0206   CO2 21 (L) 01/06/2022 0206   GLUCOSE 184 (H) 01/06/2022 0206   BUN 31 (H) 01/06/2022 0206   BUN 17 03/08/2021 1137   CREATININE 1.34 (H) 01/06/2022 0206   CALCIUM 9.4 01/06/2022 0206   PROT 7.7 01/05/2022 0900   PROT 7.9 03/08/2021 1137   ALBUMIN 2.6 (L) 01/05/2022 0900   ALBUMIN 3.9 03/08/2021 1137   AST 16 01/05/2022 0900   ALT 12 01/05/2022 0900   ALKPHOS 71 01/05/2022 0900   BILITOT 0.6 01/05/2022 0900   BILITOT 0.2 03/08/2021 1137   GFRNONAA 56 (L) 01/06/2022 0206   GFRAA >60 07/20/2019  0550   Lipase  No results found for: "LIPASE"     Studies/Results: CT ABDOMEN PELVIS W CONTRAST  Result Date: 01/05/2022 CLINICAL DATA:  Scrotal pain with purulent drainage. Concern for Fournier's gangrene. EXAM: CT ABDOMEN AND PELVIS WITH CONTRAST TECHNIQUE: Multidetector CT imaging of the abdomen and pelvis was performed using the standard protocol following bolus administration of intravenous contrast. RADIATION DOSE REDUCTION: This exam was performed according to the departmental dose-optimization program which includes automated exposure control, adjustment of the mA and/or kV according to patient size and/or use of iterative reconstruction technique. CONTRAST:  68m OMNIPAQUE IOHEXOL 350 MG/ML SOLN COMPARISON:  Scrotal ultrasound 2 days prior, CT abdomen/pelvis 04/24/2021 FINDINGS: Lower chest: The lung bases are clear. Coronary artery calcifications and aortic valve calcifications are noted. Hepatobiliary: Numerous hypodense lesions throughout the liver are similar to the prior study, likely benign cysts for which no specific imaging follow-up is required. The gallbladder is unremarkable. There is no biliary ductal dilatation. Pancreas: Unremarkable. Spleen: Unremarkable. Adrenals/Urinary  Tract: The adrenals are unremarkable. There is a 7 mm nonobstructing right lower pole stone. Small hypodense lesions in the kidneys most likely reflect benign cysts for which no specific imaging follow-up is required. There is no hydronephrosis or hydroureter. There is some excretion of contrast into the collecting systems on the delayed images. There is a small amount of gas in the bladder. Stomach/Bowel: The stomach is unremarkable. There is no evidence of bowel obstruction. There is no abnormal bowel wall thickening or inflammatory change. There is colonic diverticulosis without evidence of acute diverticulitis. The appendix is normal. Vascular/Lymphatic: There is calcified plaque throughout the nonaneurysmal  abdominal aorta. The major branch vessels are patent. The main portal and splenic veins are patent. There is no abdominal or pelvic lymphadenopathy. Reproductive: There is a 3.8 cm x 2.9 cm by 4.4 cm peripherally enhancing collection in the region of the right seminal vesicle superior to the prostate. There may also be a 1.0 cm abscess in the right aspect of the prostate (3-89). There is extensive gas tracking from the scrotum on the left into the soft tissues of the lower anterior pelvic wall. There is coarse calcification in the right scrotum. Other: There is no ascites or free intraperitoneal air. Musculoskeletal: There is no acute osseous abnormality or suspicious osseous lesion. IMPRESSION: 1. Extensive gas tracking from the scrotum on the left into the soft tissues of the lower anterior pelvic wall highly concerning for necrotizing infection/Fournier's gangrene. 2. 3.8 cm x 2.9 cm x 4.4 cm peripherally enhancing collection in the region of the right seminal vesicle superior to the prostate concerning for abscess, and additional possible small 1.0 cm prostatic abscess. 3. Small amount of gas in the bladder may be related to recent instrumentation or spread of infection. 4. 7 mm nonobstructing right lower pole renal stone. Critical Value/emergent results were called by telephone at the time of interpretation on 01/05/2022 at 10:06 am to provider [facility] , who verbally acknowledged these results. Electronically Signed   By: Valetta Mole M.D.   On: 01/05/2022 10:11   DG Chest Portable 1 View  Result Date: 01/05/2022 CLINICAL DATA:  Altered mental status EXAM: PORTABLE CHEST 1 VIEW COMPARISON:  CXR 07/09/19 FINDINGS: No pleural effusion. No pneumothorax. No displaced rib fracture. Normal cardiac and mediastinal contours. Visualized upper abdomen is unremarkable. Degenerative changes of the right AC and glenohumeral joint. IMPRESSION: No focal airspace opacity. Electronically Signed   By: Marin Roberts M.D.   On: 01/05/2022 08:50    Anti-infectives: Anti-infectives (From admission, onward)    Start     Dose/Rate Route Frequency Ordered Stop   01/06/22 1100  vancomycin (VANCOCIN) IVPB 1000 mg/200 mL premix  Status:  Discontinued        1,000 mg 200 mL/hr over 60 Minutes Intravenous Every 24 hours 01/05/22 0946 01/05/22 0949   01/06/22 0000  vancomycin variable dose per unstable renal function (pharmacist dosing)  Status:  Discontinued         Does not apply See admin instructions 01/05/22 0949 01/05/22 1514   01/05/22 2200  ceFEPIme (MAXIPIME) 2 g in sodium chloride 0.9 % 100 mL IVPB  Status:  Discontinued        2 g 200 mL/hr over 30 Minutes Intravenous Every 12 hours 01/05/22 0946 01/05/22 1514   01/05/22 2000  [MAR Hold]  piperacillin-tazobactam (ZOSYN) IVPB 3.375 g        (MAR Hold since Sun 01/07/2022 at 0725.Hold Reason: Transfer to a Procedural area)  3.375 g 12.5 mL/hr over 240 Minutes Intravenous Every 8 hours 01/05/22 1711     01/05/22 1515  [MAR Hold]  linezolid (ZYVOX) IVPB 600 mg        (MAR Hold since Sun 01/07/2022 at 0725.Hold Reason: Transfer to a Procedural area)   600 mg 300 mL/hr over 60 Minutes Intravenous Every 12 hours 01/05/22 1514 01/09/25 0959   01/05/22 0945  ceFEPIme (MAXIPIME) 2 g in sodium chloride 0.9 % 100 mL IVPB        2 g 200 mL/hr over 30 Minutes Intravenous  Once 01/05/22 0932 01/05/22 1038   01/05/22 0945  metroNIDAZOLE (FLAGYL) IVPB 500 mg        500 mg 100 mL/hr over 60 Minutes Intravenous  Once 01/05/22 0932 01/05/22 1103   01/05/22 0945  vancomycin (VANCOCIN) IVPB 1000 mg/200 mL premix  Status:  Discontinued        1,000 mg 200 mL/hr over 60 Minutes Intravenous  Once 01/05/22 0932 01/05/22 0944   01/05/22 0945  vancomycin (VANCOREADY) IVPB 2000 mg/400 mL        2,000 mg 200 mL/hr over 120 Minutes Intravenous  Once 01/05/22 0944 01/05/22 1256        Assessment/Plan 71 yo male with Fournier gangrene extending into the left inguinal  canal and RLQ abdominal wall. POD2 s/p debridement. - Broad-spectrum antibiotics - Return to OR today for exam and possible further debridement   LOS: 2 days    Michaelle Birks, MD [facility] Surgery General, Hepatobiliary and Pancreatic Surgery 01/07/22 8:03 AM

## 2022-01-07 NOTE — Interval H&P Note (Signed)
History and Physical Interval Note:  01/07/2022 6:26 AM  Timothy Davenport  has presented today for surgery, with the diagnosis of FOURNIER GANGRENE.  The various methods of treatment have been discussed with the patient and family. After consideration of risks, benefits and other options for treatment, the patient has consented to  Procedure(s): REVISION FOURNIER (N/A) as a surgical intervention.  The patient's history has been reviewed, patient examined, no change in status, stable for surgery.  I have reviewed the patient's chart and labs.  Questions were answered to the patient's satisfaction.     Kataleah Bejar L Kimie Pidcock

## 2022-01-07 NOTE — Op Note (Signed)
Date: 01/07/22  Patient: Timothy Davenport MRN: [id number]  Preoperative Diagnosis: Fournier's gangrene Postoperative Diagnosis: Same  Procedure: Exam under anesthesia, excisional debridement of the bilateral groins and lower abdominal wall  Surgeon: Michaelle Birks, MD Co-Surgeon: Donald Pore, MD (Urology)  EBL: 50 mL  Anesthesia: General  Specimens: None  Indications: Mr. Cafiero is a 71 yo male who underwent debridement of Fournier's gangrene two days ago. He was brought to the OR today for a second look and additional debridement.  Findings: Necrotic tissue in the bilateral groins extending onto the lower abdominal wall. Nonviable penile skin.  Procedure details: Informed consent was obtained in the preoperative area prior to the procedure. The patient was brought to the operating room and placed on the table in the supine position. General anesthesia was induced and appropriate lines and drains were placed for intraoperative monitoring. Perioperative antibiotics were administered per SCIP guidelines. The patient was prepped and draped in the usual sterile fashion. A pre-procedure timeout was taken verifying patient identity, surgical site and procedure to be performed.  The wound was examined.  There was necrotic subcutaneous fat and nonviable skin in the left groin with some extension onto the lower abdominal wall.  This necrotic tissue and skin was sharply debrided back to healthy bleeding tissue.  On probing the wound, there was also tracking and a cavity overlying the right groin.  The skin incision was extended to open the skin over the right inguinal canal.  There was purulent drainage in this area.  Nonviable subcutaneous fat and skin were sharply debrided.  The external oblique fascia remained in tact over the inguinal canal bilaterally.  Debridement of nonviable penile skin and scrotum was also performed (please see Dr. Guerry Bruin separately dictated operative note).  The wound was  packed with saline moistened Kerlix.  All counts were correct x2 at the end of the procedure. The patient was extubated and taken to PACU in stable condition.  Michaelle Birks, MD 01/07/22 2:27 PM

## 2022-01-07 NOTE — Anesthesia Procedure Notes (Signed)
Procedure Name: Intubation Date/Time: 01/07/2022 8:32 AM  Performed by: Carolan Clines, CRNAPre-anesthesia Checklist: Patient identified, Emergency Drugs available, Suction available and Patient being monitored Patient Re-evaluated:Patient Re-evaluated prior to induction Oxygen Delivery Method: Circle System Utilized Preoxygenation: Pre-oxygenation with 100% oxygen Induction Type: IV induction Ventilation: Mask ventilation without difficulty Laryngoscope Size: Mac and 4 Grade View: Grade I Tube type: Oral Tube size: 7.5 mm Number of attempts: 1 Airway Equipment and Method: Stylet Placement Confirmation: ETT inserted through vocal cords under direct vision, positive ETCO2 and breath sounds checked- equal and bilateral Secured at: 23 cm Tube secured with: Tape Dental Injury: Teeth and Oropharynx as per pre-operative assessment

## 2022-01-08 ENCOUNTER — Inpatient Hospital Stay (HOSPITAL_COMMUNITY): Payer: Medicare Other

## 2022-01-08 ENCOUNTER — Encounter (HOSPITAL_COMMUNITY): Payer: Self-pay | Admitting: Urology

## 2022-01-08 ENCOUNTER — Telehealth: Payer: Self-pay | Admitting: Physical Medicine and Rehabilitation

## 2022-01-08 DIAGNOSIS — N493 Fournier gangrene: Secondary | ICD-10-CM | POA: Diagnosis not present

## 2022-01-08 LAB — CBC
HCT: 26.5 % — ABNORMAL LOW (ref 39.0–52.0)
Hemoglobin: 8.8 g/dL — ABNORMAL LOW (ref 13.0–17.0)
MCH: 25.3 pg — ABNORMAL LOW (ref 26.0–34.0)
MCHC: 33.2 g/dL (ref 30.0–36.0)
MCV: 76.1 fL — ABNORMAL LOW (ref 80.0–100.0)
Platelets: 162 10*3/uL (ref 150–400)
RBC: 3.48 MIL/uL — ABNORMAL LOW (ref 4.22–5.81)
RDW: 16.5 % — ABNORMAL HIGH (ref 11.5–15.5)
WBC: 13.2 10*3/uL — ABNORMAL HIGH (ref 4.0–10.5)
nRBC: 0 % (ref 0.0–0.2)

## 2022-01-08 LAB — BASIC METABOLIC PANEL
Anion gap: 9 (ref 5–15)
BUN: 18 mg/dL (ref 8–23)
CO2: 23 mmol/L (ref 22–32)
Calcium: 9.8 mg/dL (ref 8.9–10.3)
Chloride: 108 mmol/L (ref 98–111)
Creatinine, Ser: 1.12 mg/dL (ref 0.61–1.24)
GFR, Estimated: >60 mL/min (ref >60)
Glucose, Bld: 150 mg/dL — ABNORMAL HIGH (ref 70–99)
Potassium: 3.7 mmol/L (ref 3.5–5.1)
Sodium: 140 mmol/L (ref 135–145)

## 2022-01-08 LAB — GLUCOSE, CAPILLARY
Glucose-Capillary: 134 mg/dL — ABNORMAL HIGH (ref 70–99)
Glucose-Capillary: 144 mg/dL — ABNORMAL HIGH (ref 70–99)
Glucose-Capillary: 203 mg/dL — ABNORMAL HIGH (ref 70–99)
Glucose-Capillary: 180 mg/dL — ABNORMAL HIGH (ref 70–99)

## 2022-01-08 MED ORDER — IOHEXOL 350 MG/ML SOLN
75.0000 mL | Freq: Once | INTRAVENOUS | Status: AC | PRN
  Administered 2022-01-08: 75 mL via INTRAVENOUS

## 2022-01-08 MED ORDER — ADULT MULTIVITAMIN W/MINERALS CH
1.0000 | ORAL_TABLET | Freq: Every day | ORAL | Status: DC
  Administered 2022-01-08 – 2022-01-24 (×15): 1 via ORAL
  Filled 2022-01-08 (×15): qty 1

## 2022-01-08 MED ORDER — POLYETHYLENE GLYCOL 3350 17 G PO PACK
17.0000 g | PACK | Freq: Every day | ORAL | Status: DC
  Administered 2022-01-08 – 2022-01-23 (×10): 17 g via ORAL
  Filled 2022-01-08 (×14): qty 1

## 2022-01-08 MED ORDER — ENSURE ENLIVE PO LIQD
237.0000 mL | Freq: Two times a day (BID) | ORAL | Status: DC
  Administered 2022-01-08 – 2022-01-24 (×20): 237 mL via ORAL
  Filled 2022-01-08: qty 237

## 2022-01-08 MED ORDER — SODIUM CHLORIDE 0.9 % IV SOLN
2.0000 g | INTRAVENOUS | Status: DC
  Administered 2022-01-08 – 2022-01-10 (×3): 2 g via INTRAVENOUS
  Filled 2022-01-08 (×3): qty 20

## 2022-01-08 MED ORDER — LACTATED RINGERS IV SOLN: INTRAVENOUS | Status: AC

## 2022-01-08 NOTE — Progress Notes (Addendum)
Alert and bit confused, dressing is reinforced with ABD pad.

## 2022-01-08 NOTE — Progress Notes (Addendum)
Ate few small bites of dinner.

## 2022-01-08 NOTE — Progress Notes (Cosign Needed Addendum)
Subjective:  The patient denies any pain this morning, and believes his groin pain is well controlled with medication. He has had one bowel movement since this admission, and he requested assistance to have another bowel movement this morning. He has had low appetite for the past week, but reports he ate a little yesterday. He has been drinking fluids without difficulty. Denies nausea and vomiting.   Objective:  Vital signs in last 24 hours: Vitals:   01/07/22 2052 01/08/22 0617 01/08/22 0742 01/08/22 1059  BP: 112/73 129/75 131/86 139/82  Pulse: 76 81 69 71  Resp: '16  16 16  '$ Temp: 98.1 F (36.7 C)  98.8 F (37.1 C) 98.6 F (37 C)  TempSrc: Oral  Oral Oral  SpO2: 100% 100% 100% 95%  Weight:      Height:       Weight change:   Intake/Output Summary (Last 24 hours) at 01/08/2022 1219 Last data filed at 01/08/2022 0528 Gross per 24 hour  Intake 320 ml  Output 750 ml  Net -430 ml   General: Appears to be sitting comfortably in bed; No acute distress Cardiovascular: RRR; no murmurs, rubs, or gallops; 2+ radial pulses Pulmonary: Normal respiratory effort; Lungs clear to auscultation anteriorly, no wheezing, rhonchi, or rales  Extremities: No foot swelling; No skin breakdown over bilateral heels  Skin: Warm and dry; Decreased skin turgor; Wound dressing over scrotum with some dark brown drainage, no visible swelling, erythema or bleeding noted; round ulceration to inner right thigh, dry, no discharge or bleeding.  Neuro: A&O x4; No focal deficits     Assessment/Plan:  Principal Problem:   Fournier gangrene  Timothy Davenport 71 year T2DM on insulin, paraplegia T8 spinal cord injury, neurogenic bladder, CKD 3, HLD, prior CVA who presents for worsening left scrotal infection, admitted for fournier gangrene.   Sepsis secondary to fournier gangrene Seminal Vesical abscess The patient is status post second I&D yesterday, 11/19. He is hemodynamically stable with WBC improved to  13.2 down from 15.4 yesterday on antibiotics. The patient is currently on Zosyn and Linezolid day four. His sensitivities came back pan-sensitive, so antibiotics will be narrowed to Ceftriaxone today. Blood culture still shows 3 days no growth. Repeat CT pelvis shows continued presence of fluid collection or abscess in the right seminal vesical. Repeat I&D scheduled for tomorrow, 11/21. Will follow urology and surgery's recommendations.   Plan to also add on protein supplementation and bowel regimen for low appetite and to avoid constipation. Will consult RD for further recommendations. Given decreased skin turgor and poor PO intake, will also administer IV fluids.    - Urology and surgery following, appreciate recommendations  - Discontinue Zosyn and Linezolid - Start Ceftriaxone 2g q24 (day 1) - Ensure feeding supplement 2 times daily between meals  - RD consult, appreciate recommendations  - Miralax 17g daily - LR fluids 100 ml/hr - Follow surgical and blood cultures  - Trend CBC  T2DM on insulin  Holding home SGLT2 inhibitor in the setting of infection. CBG today is 144. Will encourage PO and continue to monitor.  -Trend CBG -Continue semglee 5 units nightly, SSI   AKI on CKD3b, resolved  Creatinine 1.12 today.  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  -Continue dantrolene for spasms  -Oxybutynin 7.5 mg twice daily  HTN Normotensive at this time. Home regimen includes losartan.  -Continue to hold losartan at this time.  Iron deficiency anemia  Post-op hgb today is 8.8, down from 9.8 yesterday. The patient had another I&D yesterday and has another scheduled for tomorrow, so will continue to monitor.  -Trend CBC -Continue to hold home ferrous sulfate in setting of ongoing infection requiring further debridement   History of CVA Memory changes Recent mental status changes likely due to infection given its acute nature  and worsening over the course of the infection. Today he is A&O x4. Will continue to monitor neuro and psych exam. Chronic memory changes will likely be addressed in the outpatient setting.    PTSD Depression -Continue home sertraline        LOS: 3 days   [name], Medical Student 01/08/2022, 12:19 PM   Attestation for Student Documentation:  I personally was present and performed or re-performed the history, physical exam and medical decision-making activities of this service and have verified that the service and findings are accurately documented in the student's note.  [name], DO 01/08/2022, 6:48 PM

## 2022-01-08 NOTE — Progress Notes (Signed)
1 Day Post-Op  Subjective: Pain well controlled (3/10 at rest). Not eating much due to low appetite. No nausea or emesis.   Objective: Vital signs in last 24 hours: Temp:  [98.1 F (36.7 C)-98.8 F (37.1 C)] 98.6 F (37 C) (11/20 1059) Pulse Rate:  [69-82] 71 (11/20 1059) Resp:  [16-18] 16 (11/20 1059) BP: (112-139)/(73-86) 139/82 (11/20 1059) SpO2:  [95 %-100 %] 95 % (11/20 1059) Last BM Date :  (PTA)  Intake/Output from previous day: 11/19 0701 - 11/20 0700 In: 820 [P.O.:220; I.V.:500; IV Piggyback:100] Out: 800 [Urine:750; Blood:50] Intake/Output this shift: No intake/output data recorded.  PE: General: NAD Resp: normal work of breathing Abd/GU: dressings in place partially saturated with sanguinous fluid, foley draining clear yellow urine.  Lab Results:  Recent Labs    01/07/22 1405 01/08/22 0333  WBC 15.4* 13.2*  HGB 9.8* 8.8*  HCT 29.3* 26.5*  PLT 188 162    BMET Recent Labs    01/07/22 1228 01/08/22 0333  NA 143 140  K 4.6 3.7  CL 109 108  CO2 21* 23  GLUCOSE 153* 150*  BUN 22 18  CREATININE 1.17 1.12  CALCIUM 9.7 9.8    PT/INR No results for input(s): "LABPROT", "INR" in the last 72 hours. CMP     Component Value Date/Time   NA 140 01/08/2022 0333   NA 137 03/08/2021 1137   K 3.7 01/08/2022 0333   CL 108 01/08/2022 0333   CO2 23 01/08/2022 0333   GLUCOSE 150 (H) 01/08/2022 0333   BUN 18 01/08/2022 0333   BUN 17 03/08/2021 1137   CREATININE 1.12 01/08/2022 0333   CALCIUM 9.8 01/08/2022 0333   PROT 7.7 01/05/2022 0900   PROT 7.9 03/08/2021 1137   ALBUMIN 2.6 (L) 01/05/2022 0900   ALBUMIN 3.9 03/08/2021 1137   AST 16 01/05/2022 0900   ALT 12 01/05/2022 0900   ALKPHOS 71 01/05/2022 0900   BILITOT 0.6 01/05/2022 0900   BILITOT 0.2 03/08/2021 1137   GFRNONAA >60 01/08/2022 0333   GFRAA >60 07/20/2019 0550   Lipase  No results found for: "LIPASE"     Studies/Results: CT PELVIS W CONTRAST  Result Date:  01/08/2022 CLINICAL DATA:  Perianal abscess or fistula. EXAM: CT PELVIS WITH CONTRAST TECHNIQUE: Multidetector CT imaging of the pelvis was performed using the standard protocol following the bolus administration of intravenous contrast. RADIATION DOSE REDUCTION: This exam was performed according to the departmental dose-optimization program which includes automated exposure control, adjustment of the mA and/or kV according to patient size and/or use of iterative reconstruction technique. CONTRAST:  19m OMNIPAQUE IOHEXOL 350 MG/ML SOLN COMPARISON:  January 05, 2022. FINDINGS: Urinary Tract: Urinary bladder is decompressed secondary to Foley catheter. Visualized ureters are unremarkable. Bowel: There is no definite evidence of bowel obstruction. The appendix is unremarkable. There is no definite evidence of perianal abscess. Vascular/Lymphatic: Atherosclerosis of visualized abdominal aorta is noted. No definite adenopathy is noted. Reproductive: Prostate gland is unremarkable. However, there is continued presence of 3.6 x 3.7 cm fluid collection or abscess in the region of the right seminal vesicle. Other: Large defect is seen involving the anterior pelvic subcutaneous tissues as well as in the perineum consistent with recent surgical resection for infection. Musculoskeletal: No significant osseous abnormality is noted. IMPRESSION: Large surgical defect is seen involving the subcutaneous tissues of the anterior pelvis and perineum. Continued presence of 3.7 x 3.6 cm fluid collection or abscess in the region of the right seminal vesicle.  There is no definite evidence of perianal abscess currently. Aortic Atherosclerosis (ICD10-I70.0). Electronically Signed   By: Marijo Conception M.D.   On: 01/08/2022 11:47    Anti-infectives: Anti-infectives (From admission, onward)    Start     Dose/Rate Route Frequency Ordered Stop   01/08/22 1400  cefTRIAXone (ROCEPHIN) 2 g in sodium chloride 0.9 % 100 mL IVPB        2  g 200 mL/hr over 30 Minutes Intravenous Every 24 hours 01/08/22 1250     01/07/22 1500  piperacillin-tazobactam (ZOSYN) IVPB 3.375 g  Status:  Discontinued        3.375 g 12.5 mL/hr over 240 Minutes Intravenous Every 8 hours 01/07/22 1357 01/08/22 1244   01/06/22 1100  vancomycin (VANCOCIN) IVPB 1000 mg/200 mL premix  Status:  Discontinued        1,000 mg 200 mL/hr over 60 Minutes Intravenous Every 24 hours 01/05/22 0946 01/05/22 0949   01/06/22 0000  vancomycin variable dose per unstable renal function (pharmacist dosing)  Status:  Discontinued         Does not apply See admin instructions 01/05/22 0949 01/05/22 1514   01/05/22 2200  ceFEPIme (MAXIPIME) 2 g in sodium chloride 0.9 % 100 mL IVPB  Status:  Discontinued        2 g 200 mL/hr over 30 Minutes Intravenous Every 12 hours 01/05/22 0946 01/05/22 1514   01/05/22 2000  piperacillin-tazobactam (ZOSYN) IVPB 3.375 g  Status:  Discontinued        3.375 g 12.5 mL/hr over 240 Minutes Intravenous Every 8 hours 01/05/22 1711 01/07/22 1250   01/05/22 1515  linezolid (ZYVOX) IVPB 600 mg  Status:  Discontinued        600 mg 300 mL/hr over 60 Minutes Intravenous Every 12 hours 01/05/22 1514 01/08/22 1244   01/05/22 0945  ceFEPIme (MAXIPIME) 2 g in sodium chloride 0.9 % 100 mL IVPB        2 g 200 mL/hr over 30 Minutes Intravenous  Once 01/05/22 0932 01/05/22 1038   01/05/22 0945  metroNIDAZOLE (FLAGYL) IVPB 500 mg        500 mg 100 mL/hr over 60 Minutes Intravenous  Once 01/05/22 0932 01/05/22 1103   01/05/22 0945  vancomycin (VANCOCIN) IVPB 1000 mg/200 mL premix  Status:  Discontinued        1,000 mg 200 mL/hr over 60 Minutes Intravenous  Once 01/05/22 0932 01/05/22 0944   01/05/22 0945  vancomycin (VANCOREADY) IVPB 2000 mg/400 mL        2,000 mg 200 mL/hr over 120 Minutes Intravenous  Once 01/05/22 0944 01/05/22 1256        Assessment/Plan 71 yo male with 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  POD2 s/p debridement, POD1 second look debridement by Dr. Zenia Resides and Dr. Cain Sieve - afebrile and WBC improving on abx - CT this am per urology with fluid collection in region of the right seminal vesicle - plan for repeat OR debridement tomorrow with urology - will eventually need at least BID WTD dressing changes  FEN: carb mod ID: [email or web address] VTE: okay for chemical ppx from our standpoint   Winferd Humphrey, [facility] Surgery 01/08/2022, 12:57 PM Please see Amion for pager number during day hours 7:00am-4:30pm

## 2022-01-08 NOTE — Progress Notes (Addendum)
Lunch tray and pt wife at bedside. Encouraged PO intake. Pt declined food at this time. Ensure provided

## 2022-01-08 NOTE — H&P (View-Only) (Signed)
1 Day Post-Op  Subjective: Pain well controlled (3/10 at rest). Not eating much due to low appetite. No nausea or emesis.   Objective: Vital signs in last 24 hours: Temp:  [98.1 F (36.7 C)-98.8 F (37.1 C)] 98.6 F (37 C) (11/20 1059) Pulse Rate:  [69-82] 71 (11/20 1059) Resp:  [16-18] 16 (11/20 1059) BP: (112-139)/(73-86) 139/82 (11/20 1059) SpO2:  [95 %-100 %] 95 % (11/20 1059) Last BM Date :  (PTA)  Intake/Output from previous day: 11/19 0701 - 11/20 0700 In: 820 [P.O.:220; I.V.:500; IV Piggyback:100] Out: 800 [Urine:750; Blood:50] Intake/Output this shift: No intake/output data recorded.  PE: General: NAD Resp: normal work of breathing Abd/GU: dressings in place partially saturated with sanguinous fluid, foley draining clear yellow urine.  Lab Results:  Recent Labs    01/07/22 1405 01/08/22 0333  WBC 15.4* 13.2*  HGB 9.8* 8.8*  HCT 29.3* 26.5*  PLT 188 162    BMET Recent Labs    01/07/22 1228 01/08/22 0333  NA 143 140  K 4.6 3.7  CL 109 108  CO2 21* 23  GLUCOSE 153* 150*  BUN 22 18  CREATININE 1.17 1.12  CALCIUM 9.7 9.8    PT/INR No results for input(s): "LABPROT", "INR" in the last 72 hours. CMP     Component Value Date/Time   NA 140 01/08/2022 0333   NA 137 03/08/2021 1137   K 3.7 01/08/2022 0333   CL 108 01/08/2022 0333   CO2 23 01/08/2022 0333   GLUCOSE 150 (H) 01/08/2022 0333   BUN 18 01/08/2022 0333   BUN 17 03/08/2021 1137   CREATININE 1.12 01/08/2022 0333   CALCIUM 9.8 01/08/2022 0333   PROT 7.7 01/05/2022 0900   PROT 7.9 03/08/2021 1137   ALBUMIN 2.6 (L) 01/05/2022 0900   ALBUMIN 3.9 03/08/2021 1137   AST 16 01/05/2022 0900   ALT 12 01/05/2022 0900   ALKPHOS 71 01/05/2022 0900   BILITOT 0.6 01/05/2022 0900   BILITOT 0.2 03/08/2021 1137   GFRNONAA >60 01/08/2022 0333   GFRAA >60 07/20/2019 0550   Lipase  No results found for: "LIPASE"     Studies/Results: CT PELVIS W CONTRAST  Result Date:  01/08/2022 CLINICAL DATA:  Perianal abscess or fistula. EXAM: CT PELVIS WITH CONTRAST TECHNIQUE: Multidetector CT imaging of the pelvis was performed using the standard protocol following the bolus administration of intravenous contrast. RADIATION DOSE REDUCTION: This exam was performed according to the departmental dose-optimization program which includes automated exposure control, adjustment of the mA and/or kV according to patient size and/or use of iterative reconstruction technique. CONTRAST:  78m OMNIPAQUE IOHEXOL 350 MG/ML SOLN COMPARISON:  January 05, 2022. FINDINGS: Urinary Tract: Urinary bladder is decompressed secondary to Foley catheter. Visualized ureters are unremarkable. Bowel: There is no definite evidence of bowel obstruction. The appendix is unremarkable. There is no definite evidence of perianal abscess. Vascular/Lymphatic: Atherosclerosis of visualized abdominal aorta is noted. No definite adenopathy is noted. Reproductive: Prostate gland is unremarkable. However, there is continued presence of 3.6 x 3.7 cm fluid collection or abscess in the region of the right seminal vesicle. Other: Large defect is seen involving the anterior pelvic subcutaneous tissues as well as in the perineum consistent with recent surgical resection for infection. Musculoskeletal: No significant osseous abnormality is noted. IMPRESSION: Large surgical defect is seen involving the subcutaneous tissues of the anterior pelvis and perineum. Continued presence of 3.7 x 3.6 cm fluid collection or abscess in the region of the right seminal vesicle.  There is no definite evidence of perianal abscess currently. Aortic Atherosclerosis (ICD10-I70.0). Electronically Signed   By: Marijo Conception M.D.   On: 01/08/2022 11:47    Anti-infectives: Anti-infectives (From admission, onward)    Start     Dose/Rate Route Frequency Ordered Stop   01/08/22 1400  cefTRIAXone (ROCEPHIN) 2 g in sodium chloride 0.9 % 100 mL IVPB        2  g 200 mL/hr over 30 Minutes Intravenous Every 24 hours 01/08/22 1250     01/07/22 1500  piperacillin-tazobactam (ZOSYN) IVPB 3.375 g  Status:  Discontinued        3.375 g 12.5 mL/hr over 240 Minutes Intravenous Every 8 hours 01/07/22 1357 01/08/22 1244   01/06/22 1100  vancomycin (VANCOCIN) IVPB 1000 mg/200 mL premix  Status:  Discontinued        1,000 mg 200 mL/hr over 60 Minutes Intravenous Every 24 hours 01/05/22 0946 01/05/22 0949   01/06/22 0000  vancomycin variable dose per unstable renal function (pharmacist dosing)  Status:  Discontinued         Does not apply See admin instructions 01/05/22 0949 01/05/22 1514   01/05/22 2200  ceFEPIme (MAXIPIME) 2 g in sodium chloride 0.9 % 100 mL IVPB  Status:  Discontinued        2 g 200 mL/hr over 30 Minutes Intravenous Every 12 hours 01/05/22 0946 01/05/22 1514   01/05/22 2000  piperacillin-tazobactam (ZOSYN) IVPB 3.375 g  Status:  Discontinued        3.375 g 12.5 mL/hr over 240 Minutes Intravenous Every 8 hours 01/05/22 1711 01/07/22 1250   01/05/22 1515  linezolid (ZYVOX) IVPB 600 mg  Status:  Discontinued        600 mg 300 mL/hr over 60 Minutes Intravenous Every 12 hours 01/05/22 1514 01/08/22 1244   01/05/22 0945  ceFEPIme (MAXIPIME) 2 g in sodium chloride 0.9 % 100 mL IVPB        2 g 200 mL/hr over 30 Minutes Intravenous  Once 01/05/22 0932 01/05/22 1038   01/05/22 0945  metroNIDAZOLE (FLAGYL) IVPB 500 mg        500 mg 100 mL/hr over 60 Minutes Intravenous  Once 01/05/22 0932 01/05/22 1103   01/05/22 0945  vancomycin (VANCOCIN) IVPB 1000 mg/200 mL premix  Status:  Discontinued        1,000 mg 200 mL/hr over 60 Minutes Intravenous  Once 01/05/22 0932 01/05/22 0944   01/05/22 0945  vancomycin (VANCOREADY) IVPB 2000 mg/400 mL        2,000 mg 200 mL/hr over 120 Minutes Intravenous  Once 01/05/22 0944 01/05/22 1256        Assessment/Plan 71 yo male with 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  POD2 s/p debridement, POD1 second look debridement by Dr. Zenia Resides and Dr. Cain Sieve - afebrile and WBC improving on abx - CT this am per urology with fluid collection in region of the right seminal vesicle - plan for repeat OR debridement tomorrow with urology - will eventually need at least BID WTD dressing changes  FEN: carb mod ID: [email or web address] VTE: okay for chemical ppx from our standpoint   Winferd Humphrey, [facility] Surgery 01/08/2022, 12:57 PM Please see Amion for pager number during day hours 7:00am-4:30pm

## 2022-01-08 NOTE — TOC CM/SW Note (Signed)
Per chart  plan for repeat OR debridement tomorrow with urology - will eventually need at least BID WTD dressing changes  NCM went to talk with patient regarding disposition . Patient asked NCM to come back later

## 2022-01-08 NOTE — Progress Notes (Signed)
1 Day Post-Op Subjective: No acute events overnight  CT scan obtained today shows stable/slightly enlarged pelvic abscess spanning superiorly along the rectum from the R Seminal vesicle. The previously mentioned prostate abscess is no longer present  Objective: Vital signs in last 24 hours: Temp:  [98.1 F (36.7 C)-98.8 F (37.1 C)] 98.3 F (36.8 C) (11/20 1511) Pulse Rate:  [61-93] 93 (11/20 1511) Resp:  [16] 16 (11/20 1511) BP: (112-139)/(73-86) 116/81 (11/20 1511) SpO2:  [95 %-100 %] 100 % (11/20 1511)  Intake/Output from previous day: 11/19 0701 - 11/20 0700 In: 15 [P.O.:220; I.V.:500; IV Piggyback:100] Out: 800 [Urine:750; Blood:50] Intake/Output this shift: Total I/O In: -  Out: 200 [Urine:200]  Physical Exam:  General: Alert and oriented CV: RRR Lungs: Clear Ext: NT, No erythema  I removed packing and inspected the wound - there was a small amount of superficial dead tissue along the dorsum of the penis -testicles appear viable -there are small amounts of dead appearing tissue at the inferior/left portion of the wound at the perineo-scrotal junction and along the border of the remaining scrotum  Lab Results: Recent Labs    01/07/22 0800 01/07/22 1405 01/08/22 0333  HGB 10.2* 9.8* 8.8*  HCT 30.0* 29.3* 26.5*   BMET Recent Labs    01/07/22 1228 01/08/22 0333  NA 143 140  K 4.6 3.7  CL 109 108  CO2 21* 23  GLUCOSE 153* 150*  BUN 22 18  CREATININE 1.17 1.12  CALCIUM 9.7 9.8     Studies/Results: CT PELVIS W CONTRAST  Result Date: 01/08/2022 CLINICAL DATA:  Perianal abscess or fistula. EXAM: CT PELVIS WITH CONTRAST TECHNIQUE: Multidetector CT imaging of the pelvis was performed using the standard protocol following the bolus administration of intravenous contrast. RADIATION DOSE REDUCTION: This exam was performed according to the departmental dose-optimization program which includes automated exposure control, adjustment of the mA and/or kV according  to patient size and/or use of iterative reconstruction technique. CONTRAST:  30m OMNIPAQUE IOHEXOL 350 MG/ML SOLN COMPARISON:  January 05, 2022. FINDINGS: Urinary Tract: Urinary bladder is decompressed secondary to Foley catheter. Visualized ureters are unremarkable. Bowel: There is no definite evidence of bowel obstruction. The appendix is unremarkable. There is no definite evidence of perianal abscess. Vascular/Lymphatic: Atherosclerosis of visualized abdominal aorta is noted. No definite adenopathy is noted. Reproductive: Prostate gland is unremarkable. However, there is continued presence of 3.6 x 3.7 cm fluid collection or abscess in the region of the right seminal vesicle. Other: Large defect is seen involving the anterior pelvic subcutaneous tissues as well as in the perineum consistent with recent surgical resection for infection. Musculoskeletal: No significant osseous abnormality is noted. IMPRESSION: Large surgical defect is seen involving the subcutaneous tissues of the anterior pelvis and perineum. Continued presence of 3.7 x 3.6 cm fluid collection or abscess in the region of the right seminal vesicle. There is no definite evidence of perianal abscess currently. Aortic Atherosclerosis (ICD10-I70.0). Electronically Signed   By: JMarijo ConceptionM.D.   On: 01/08/2022 11:47    Assessment/Plan: GEUGEAN ARNOTTis a 71yo M with Fournier's gangrene/necrotizing fasciitis s/p initial debridement 11/17 and further debridement in OR 11/19.  --continue dressing changes --patient going to OR with general surgery tomorrow. GU available if needed. I spoke with Dr CBrantley Stagewho feels he likely would be able to drain the pelvic abscess through the existing exposure. If not can discuss further     LOS: 3 days   LDonald PoreMD 01/08/2022, 5:11  PM [facility]  Pager: [phone]

## 2022-01-08 NOTE — Consult Note (Signed)
WOC consult requested prior to surgical team involvement.  They are now following for assessment and plan of care for post-op wound.  Please refer to their team for further questions regarding plan of care. Please re-consult if further assistance is needed.  Thank-you,  Julien Girt MSN, [place], [place], [place], [place]

## 2022-01-08 NOTE — Telephone Encounter (Signed)
Per spouse patient is hospitalized and very ill, she would like to speak to Dr. Dagoberto Ligas

## 2022-01-08 NOTE — Progress Notes (Signed)
Telephone consent for surgery obtained from wife via telephone and 2 RN verification.

## 2022-01-08 NOTE — Progress Notes (Signed)
Initial Nutrition Assessment  DOCUMENTATION CODES:   Not applicable  INTERVENTION:  -Continue carb modified diet as tolerated -Continue Ensure Enlive BID (350kcal, 20g protein/bottle) -Continue bowel regimen, constipation likely contributing to poor po intake -Start daily MVI -Continue Juven BID as po intake improves.  NUTRITION DIAGNOSIS:  Increased nutrient needs related to wound healing as evidenced by estimated needs (increased metabolic need for healing).  GOAL:  Patient will meet greater than or equal to 90% of their needs  MONITOR:  PO intake, Supplement acceptance  REASON FOR ASSESSMENT:  Consult Wound healing, Poor PO  ASSESSMENT:  Pt is a 71yo M with PMH of T2DM on insulin, paraplegia T8 spinal cord injury, neurogenic bladder, CKD 3, HTN, HLD, prior CVA who presents for worsening left scrotal infection.  Visited pt at bedside this afternoon, no family present. Pt was answering questions appropriately. Pt is s/p 2 I&Ds of scrotal abscess with third I&D planned for tomorrow, 11/21. Pt reports lack of appetite but denies n/v, abdominal pain and taste changes. He has not had a BM in >3 days which is likely contributing to poor appetite. Started on miralax today. Reports he does not like Ensure supplements but will drink them if he has to. Pt ordered Ensure Enlive BID which provides 350kcal and 20g protein/bottle. Pt would also benefit from daily MVI and Juven for healing. Given poor appetite and po intake, will start with just the MVI. Pt agreeable to MVI.  Assessed pt for malnutrition. Reports recent weight gain and EMR reflects this. No significant fat loss or muscle wasting noted. No s/s of malnutrition at this time, but is at risk if poor po intake persists.  Medications reviewed and include: Ensure Enlive, novolog ssi w/ meals, semglee, miralax, LR, rocephin  Labs reviewed: BG:134-170  NUTRITION - FOCUSED PHYSICAL EXAM:  Flowsheet Row Most Recent Value  Orbital  Region No depletion  Upper Arm Region No depletion  Thoracic and Lumbar Region No depletion  Buccal Region No depletion  Temple Region No depletion  Clavicle Bone Region Mild depletion  Clavicle and Acromion Bone Region No depletion  Scapular Bone Region No depletion  Dorsal Hand No depletion  Hair Reviewed  Eyes Reviewed  Mouth Reviewed  Skin Reviewed  Nails Reviewed       Diet Order:   Diet Order             Diet NPO time specified  Diet effective midnight           Diet Carb Modified Fluid consistency: Thin; Room service appropriate? Yes  Diet effective now                   EDUCATION NEEDS:   Not appropriate for education at this time  Skin:  Skin Assessment: Skin Integrity Issues: Skin Integrity Issues:: Incisions Incisions: groin  Last BM:  PTA, >3 days  Height:  Ht Readings from Last 1 Encounters:  01/05/22 '5\' 11"'$  (1.803 m)   Weight:  Wt Readings from Last 1 Encounters:  01/05/22 77.1 kg    BMI:  Body mass index is 23.71 kg/m.  Estimated Nutritional Needs:  Kcal:  2100-2400kcal Protein:  95-115g Fluid:  >1998m  KCandise Bowens MS, RD, LDN, CNSC See AMiON for contact information

## 2022-01-08 NOTE — Progress Notes (Signed)
Pharmacy Antibiotic Note  Timothy Davenport is a 71 y.o. 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  Noted AKI - SCr 1.9 on admit (baseline ~1.1-1.4) has improved to 1.12.   Pt is s/p two I&D sessions of scrotal abscess, pending a possible third I&D on Tuesday, 11/21. Wound cultures growing citrobacter koseri, pan sensitive. WBC have improved to 11.1, afebrile. Will deescalate to ceftriaxone.   Plan: Dc linezolid and zosyn  Start ceftriaxone 2 g Q24H  Continue to monitor cultures and overall clinical picture   Height: '5\' 11"'$  (180.3 cm) Weight: 77.1 kg (170 lb) IBW/kg (Calculated) : 75.3  Temp (24hrs), Avg:98.5 F (36.9 C), Min:98.1 F (36.7 C), Max:98.8 F (37.1 C)  Recent Labs  Lab 01/03/22 0941 01/05/22 0900 01/05/22 0920 01/05/22 1607 01/05/22 1818 01/06/22 0206 01/07/22 0800 01/07/22 1228 01/07/22 1405 01/08/22 0333  WBC 11.1* 18.0*  --   --   --  14.4*  --   --  15.4* 13.2*  CREATININE 1.26* 1.86*   < > 1.10  --  1.34* 1.10 1.17  --  1.12  LATICACIDVEN  --  1.5  --   --  1.2  --   --   --   --   --    < > = values in this interval not displayed.     Estimated Creatinine Clearance: 62.6 mL/min (by C-G formula based on SCr of 1.12 mg/dL).    Allergies  Allergen Reactions   Lisinopril Other (See Comments)    Angioedema   Enalapril-Hctz [Enalapril-Hydrochlorothiazide] Rash    Antimicrobials this admission: 11/17 vancomycin >> 11/17 11/17 cefepime >> 11/17 11/17 flagyl x 1 11/17 zosyn>>11/20 11/17 linezolid>>11/20 11/20 CTX>>  Dose adjustments this admission: Not applicable    Microbiology results: 11/17 wound>>citrobacter koseri -- pan sensitive  11/17 blood>>NG x 3 days    Pricilla Riffle, Student Pharmacist  01/08/2022 12:47 PM

## 2022-01-08 NOTE — Progress Notes (Signed)
Has BM tonight, lower part of groin dressing changed and reinforced.

## 2022-01-09 ENCOUNTER — Encounter (HOSPITAL_COMMUNITY)
Admission: EM | Disposition: A | Discharge status: Nursing Home (Includes ICF) | Payer: Self-pay | Source: Home / Self Care | Attending: Internal Medicine

## 2022-01-09 ENCOUNTER — Encounter (HOSPITAL_COMMUNITY): Payer: Self-pay | Admitting: Internal Medicine

## 2022-01-09 ENCOUNTER — Inpatient Hospital Stay (HOSPITAL_COMMUNITY): Payer: Medicare Other | Admitting: Certified Registered Nurse Anesthetist

## 2022-01-09 ENCOUNTER — Other Ambulatory Visit: Payer: Self-pay

## 2022-01-09 DIAGNOSIS — I10 Essential (primary) hypertension: Secondary | ICD-10-CM | POA: Diagnosis not present

## 2022-01-09 DIAGNOSIS — D649 Anemia, unspecified: Secondary | ICD-10-CM | POA: Diagnosis not present

## 2022-01-09 DIAGNOSIS — N493 Fournier gangrene: Principal | ICD-10-CM

## 2022-01-09 DIAGNOSIS — E119 Type 2 diabetes mellitus without complications: Secondary | ICD-10-CM | POA: Diagnosis not present

## 2022-01-09 HISTORY — PX: I & D EXTREMITY: SHX5045

## 2022-01-09 LAB — CBC WITH DIFFERENTIAL/PLATELET
Abs Immature Granulocytes: 0.13 10*3/uL — ABNORMAL HIGH (ref 0.00–0.07)
Basophils Absolute: 0 10*3/uL (ref 0.0–0.1)
Basophils Relative: 0 %
Eosinophils Absolute: 0 10*3/uL (ref 0.0–0.5)
Eosinophils Relative: 0 %
HCT: 26.6 % — ABNORMAL LOW (ref 39.0–52.0)
Hemoglobin: 8.5 g/dL — ABNORMAL LOW (ref 13.0–17.0)
Immature Granulocytes: 1 %
Lymphocytes Relative: 14 %
Lymphs Abs: 1.3 10*3/uL (ref 0.7–4.0)
MCH: 24.9 pg — ABNORMAL LOW (ref 26.0–34.0)
MCHC: 32 g/dL (ref 30.0–36.0)
MCV: 78 fL — ABNORMAL LOW (ref 80.0–100.0)
Monocytes Absolute: 0.8 10*3/uL (ref 0.1–1.0)
Monocytes Relative: 8 %
Neutro Abs: 7.5 10*3/uL (ref 1.7–7.7)
Neutrophils Relative %: 77 %
Platelets: 178 10*3/uL (ref 150–400)
RBC: 3.41 MIL/uL — ABNORMAL LOW (ref 4.22–5.81)
RDW: 16.3 % — ABNORMAL HIGH (ref 11.5–15.5)
WBC: 9.8 10*3/uL (ref 4.0–10.5)
nRBC: 0 % (ref 0.0–0.2)

## 2022-01-09 LAB — GLUCOSE, CAPILLARY
Glucose-Capillary: 96 mg/dL (ref 70–99)
Glucose-Capillary: 98 mg/dL (ref 70–99)
Glucose-Capillary: 110 mg/dL — ABNORMAL HIGH (ref 70–99)
Glucose-Capillary: 114 mg/dL — ABNORMAL HIGH (ref 70–99)
Glucose-Capillary: 152 mg/dL — ABNORMAL HIGH (ref 70–99)

## 2022-01-09 SURGERY — IRRIGATION AND DEBRIDEMENT EXTREMITY
Anesthesia: General

## 2022-01-09 MED ORDER — LACTATED RINGERS IV SOLN: INTRAVENOUS | Status: DC

## 2022-01-09 MED ORDER — PROPOFOL 10 MG/ML IV BOLUS
INTRAVENOUS | Status: DC | PRN
  Administered 2022-01-09: 120 mg via INTRAVENOUS

## 2022-01-09 MED ORDER — INSULIN ASPART 100 UNIT/ML IJ SOLN: 0.0000 [IU] | INTRAMUSCULAR | Status: DC | PRN

## 2022-01-09 MED ORDER — ONDANSETRON HCL 4 MG/2ML IJ SOLN
INTRAMUSCULAR | Status: DC | PRN
  Administered 2022-01-09: 4 mg via INTRAVENOUS

## 2022-01-09 MED ORDER — PROPOFOL 10 MG/ML IV BOLUS
INTRAVENOUS | Status: AC
  Filled 2022-01-09: qty 20

## 2022-01-09 MED ORDER — 0.9 % SODIUM CHLORIDE (POUR BTL) OPTIME
TOPICAL | Status: DC | PRN
  Administered 2022-01-09: 3000 mL

## 2022-01-09 MED ORDER — ONDANSETRON HCL 4 MG/2ML IJ SOLN
INTRAMUSCULAR | Status: AC
  Filled 2022-01-09: qty 2

## 2022-01-09 MED ORDER — LIDOCAINE 2% (20 MG/ML) 5 ML SYRINGE
INTRAMUSCULAR | Status: DC | PRN
  Administered 2022-01-09: 100 mg via INTRAVENOUS

## 2022-01-09 MED ORDER — DEXAMETHASONE SODIUM PHOSPHATE 10 MG/ML IJ SOLN
INTRAMUSCULAR | Status: AC
  Filled 2022-01-09: qty 1

## 2022-01-09 MED ORDER — DEXAMETHASONE SODIUM PHOSPHATE 10 MG/ML IJ SOLN
INTRAMUSCULAR | Status: DC | PRN
  Administered 2022-01-09: 5 mg via INTRAVENOUS

## 2022-01-09 MED ORDER — FENTANYL CITRATE (PF) 250 MCG/5ML IJ SOLN
INTRAMUSCULAR | Status: DC | PRN
  Administered 2022-01-09 (×2): 50 ug via INTRAVENOUS

## 2022-01-09 MED ORDER — PHENYLEPHRINE 80 MCG/ML (10ML) SYRINGE FOR IV PUSH (FOR BLOOD PRESSURE SUPPORT)
PREFILLED_SYRINGE | INTRAVENOUS | Status: DC | PRN
  Administered 2022-01-09: 40 ug via INTRAVENOUS
  Administered 2022-01-09 (×2): 160 ug via INTRAVENOUS

## 2022-01-09 MED ORDER — SUCCINYLCHOLINE CHLORIDE 200 MG/10ML IV SOSY
PREFILLED_SYRINGE | INTRAVENOUS | Status: DC | PRN
  Administered 2022-01-09: 140 mg via INTRAVENOUS

## 2022-01-09 MED ORDER — CHLORHEXIDINE GLUCONATE 0.12 % MT SOLN
15.0000 mL | Freq: Once | OROMUCOSAL | Status: AC
  Administered 2022-01-09: 15 mL via OROMUCOSAL

## 2022-01-09 MED ORDER — FENTANYL CITRATE (PF) 250 MCG/5ML IJ SOLN
INTRAMUSCULAR | Status: AC
  Filled 2022-01-09: qty 5

## 2022-01-09 MED ORDER — SUGAMMADEX SODIUM 200 MG/2ML IV SOLN
INTRAVENOUS | Status: DC | PRN
  Administered 2022-01-09: 400 mg via INTRAVENOUS

## 2022-01-09 MED ORDER — ROCURONIUM BROMIDE 10 MG/ML (PF) SYRINGE
PREFILLED_SYRINGE | INTRAVENOUS | Status: DC | PRN
  Administered 2022-01-09: 40 mg via INTRAVENOUS

## 2022-01-09 MED ORDER — ORAL CARE MOUTH RINSE: 15.0000 mL | Freq: Once | OROMUCOSAL | Status: AC

## 2022-01-09 SURGICAL SUPPLY — 35 items
APL PRP STRL LF DISP 70% ISPRP (MISCELLANEOUS)
BAG COUNTER SPONGE SURGICOUNT (BAG) ×1 IMPLANT
BAG SPNG CNTER NS LX DISP (BAG) ×1
BNDG GAUZE DERMACEA FLUFF 4 (GAUZE/BANDAGES/DRESSINGS) IMPLANT
BNDG GZE DERMACEA 4 6PLY (GAUZE/BANDAGES/DRESSINGS) ×2
CANISTER SUCT 3000ML PPV (MISCELLANEOUS) ×1 IMPLANT
CHLORAPREP W/TINT 26 (MISCELLANEOUS) IMPLANT
COVER SURGICAL LIGHT HANDLE (MISCELLANEOUS) ×1 IMPLANT
DRAPE HALF SHEET 40X57 (DRAPES) IMPLANT
DRAPE LAPAROSCOPIC ABDOMINAL (DRAPES) IMPLANT
DRAPE LAPAROTOMY 100X72 PEDS (DRAPES) IMPLANT
ELECT CAUTERY BLADE 6.4 (BLADE) ×1 IMPLANT
ELECT REM PT RETURN 9FT ADLT (ELECTROSURGICAL) ×1
ELECTRODE REM PT RTRN 9FT ADLT (ELECTROSURGICAL) ×1 IMPLANT
GAUZE PAD ABD 8X10 STRL (GAUZE/BANDAGES/DRESSINGS) IMPLANT
GAUZE SPONGE 4X4 12PLY STRL (GAUZE/BANDAGES/DRESSINGS) IMPLANT
GLOVE BIO SURGEON STRL SZ8 (GLOVE) ×1 IMPLANT
GLOVE BIOGEL PI IND STRL 8 (GLOVE) ×1 IMPLANT
GOWN STRL REUS W/ TWL LRG LVL3 (GOWN DISPOSABLE) ×1 IMPLANT
GOWN STRL REUS W/ TWL XL LVL3 (GOWN DISPOSABLE) ×1 IMPLANT
GOWN STRL REUS W/TWL LRG LVL3 (GOWN DISPOSABLE) ×1
GOWN STRL REUS W/TWL XL LVL3 (GOWN DISPOSABLE) ×1
HANDPIECE INTERPULSE COAX TIP (DISPOSABLE) ×1
KIT BASIN OR (CUSTOM PROCEDURE TRAY) ×1 IMPLANT
KIT TURNOVER KIT B (KITS) ×1 IMPLANT
NS IRRIG 1000ML POUR BTL (IV SOLUTION) ×1 IMPLANT
PACK GENERAL/GYN (CUSTOM PROCEDURE TRAY) ×1 IMPLANT
PAD ARMBOARD 7.5X6 YLW CONV (MISCELLANEOUS) ×1 IMPLANT
PENCIL SMOKE EVACUATOR (MISCELLANEOUS) ×1 IMPLANT
SET HNDPC FAN SPRY TIP SCT (DISPOSABLE) IMPLANT
SWAB COLLECTION DEVICE MRSA (MISCELLANEOUS) IMPLANT
SWAB CULTURE ESWAB REG 1ML (MISCELLANEOUS) IMPLANT
TOWEL GREEN STERILE (TOWEL DISPOSABLE) ×1 IMPLANT
TOWEL GREEN STERILE FF (TOWEL DISPOSABLE) ×1 IMPLANT
UNDERPAD 30X36 HEAVY ABSORB (UNDERPADS AND DIAPERS) IMPLANT

## 2022-01-09 NOTE — Anesthesia Preprocedure Evaluation (Signed)
Anesthesia Evaluation  Patient identified by MRN, date of birth, ID band Patient awake    Reviewed: Allergy & Precautions, H&P , NPO status , Patient's Chart, lab work & pertinent test results  Airway Mallampati: II  TM Distance: >3 FB Neck ROM: Full    Dental no notable dental hx.    Pulmonary neg pulmonary ROS   Pulmonary exam normal breath sounds clear to auscultation       Cardiovascular hypertension, Pt. on medications Normal cardiovascular exam Rhythm:Regular Rate:Normal     Neuro/Psych negative neurological ROS  negative psych ROS   GI/Hepatic negative GI ROS, Neg liver ROS,,,  Endo/Other  diabetes    Renal/GU negative Renal ROS  negative genitourinary   Musculoskeletal negative musculoskeletal ROS (+)    Abdominal   Peds negative pediatric ROS (+)  Hematology  (+) Blood dyscrasia, anemia   Anesthesia Other Findings   Reproductive/Obstetrics negative OB ROS                             Anesthesia Physical Anesthesia Plan  ASA: 3  Anesthesia Plan: General   Post-op Pain Management: Ofirmev IV (intra-op)*   Induction: Intravenous  PONV Risk Score and Plan: 2 and Ondansetron, Dexamethasone and Treatment may vary due to age or medical condition  Airway Management Planned: Oral ETT  Additional Equipment:   Intra-op Plan:   Post-operative Plan: Extubation in OR  Informed Consent: I have reviewed the patients History and Physical, chart, labs and discussed the procedure including the risks, benefits and alternatives for the proposed anesthesia with the patient or authorized representative who has indicated his/her understanding and acceptance.     Dental advisory given  Plan Discussed with: CRNA and Surgeon  Anesthesia Plan Comments:        Anesthesia Quick Evaluation

## 2022-01-09 NOTE — Anesthesia Procedure Notes (Signed)
Procedure Name: Intubation Date/Time: 01/09/2022 10:56 AM  Performed by: Colin Benton, CRNAPre-anesthesia Checklist: Patient identified, Emergency Drugs available, Suction available and Patient being monitored Patient Re-evaluated:Patient Re-evaluated prior to induction Oxygen Delivery Method: Circle system utilized Preoxygenation: Pre-oxygenation with 100% oxygen Induction Type: IV induction Ventilation: Mask ventilation without difficulty Laryngoscope Size: Miller and 2 Grade View: Grade I Tube type: Oral Tube size: 7.5 mm Number of attempts: 1 Airway Equipment and Method: Stylet Placement Confirmation: ETT inserted through vocal cords under direct vision, positive ETCO2 and breath sounds checked- equal and bilateral Secured at: 23 cm Tube secured with: Tape Dental Injury: Teeth and Oropharynx as per pre-operative assessment

## 2022-01-09 NOTE — Anesthesia Postprocedure Evaluation (Signed)
Anesthesia Post Note  Patient: Timothy Davenport  Procedure(s) Performed: EXCISIONAL DEBRIDEMENT BILATERAL GROIN AND LOWER ABDOMEN     Patient location during evaluation: PACU Anesthesia Type: General Level of consciousness: awake and alert Pain management: pain level controlled Vital Signs Assessment: post-procedure vital signs reviewed and stable Respiratory status: spontaneous breathing, nonlabored ventilation, respiratory function stable and patient connected to nasal cannula oxygen Cardiovascular status: blood pressure returned to baseline and stable Postop Assessment: no apparent nausea or vomiting Anesthetic complications: no  No notable events documented.  Last Vitals:  Vitals:   01/09/22 0953 01/09/22 1147  BP: 137/87 134/80  Pulse: 70 65  Resp: 17 18  Temp: 37.1 C 37.3 C  SpO2: 96% 98%    Last Pain:  Vitals:   01/09/22 1147  TempSrc:   PainSc: 0-No pain                 Kayliee Atienza,Arshia S

## 2022-01-09 NOTE — Transfer of Care (Signed)
Immediate Anesthesia Transfer of Care Note  Patient: Timothy Davenport  Procedure(s) Performed: EXCISIONAL DEBRIDEMENT BILATERAL GROIN AND LOWER ABDOMEN  Patient Location: PACU  Anesthesia Type:General  Level of Consciousness: drowsy  Airway & Oxygen Therapy: Patient Spontanous Breathing and Patient connected to nasal cannula oxygen  Post-op Assessment: Report given to RN, Post -op Vital signs reviewed and stable, and Patient moving all extremities X 4  Post vital signs: Reviewed and stable  Last Vitals:  Vitals Value Taken Time  BP 134/80 01/09/22 1147  Temp    Pulse 56 01/09/22 1149  Resp 18 01/09/22 1151  SpO2 98 % 01/09/22 1149  Vitals shown include unvalidated device data.  Last Pain:  Vitals:   01/09/22 1005  TempSrc:   PainSc: 0-No pain      Patients Stated Pain Goal: 0 (62/83/66 2947)  Complications: No notable events documented.

## 2022-01-09 NOTE — Interval H&P Note (Signed)
History and Physical Interval Note:  01/09/2022 10:01 AM  Timothy Davenport  has presented today for surgery, with the diagnosis of GANGRENE SCROTUM.  The various methods of treatment have been discussed with the patient and family. After consideration of risks, benefits and other options for treatment, the patient has consented to  Procedure(s): EXCISIONAL DEBRIDEMENT BILATERAL GROIN AND LOWER ABDOMEN (N/A) as a surgical intervention.  The patient's history has been reviewed, patient examined, no change in status, stable for surgery.  I have reviewed the patient's chart and labs.  Questions were answered to the patient's satisfaction.     [place]

## 2022-01-09 NOTE — Op Note (Signed)
Preoperative diagnosis: Fournier's gangrene involving scrotum and perineum  Postoperative diagnosis: Same  Procedure: Wound exploration of perineum and scrotum with washout of open wound  Surgeon: Erroll Luna, MD  Anesthesia: General  EBL: Minimal  Specimen: None  Indications for procedure: The patient is a 71 year old male with a 2 previous perineal and scrotal debridements for Fournier's gangrene.  He returns today for wound washout and for reexamination to see if there is any other need for debridement today.  Risks and benefits explained the patient he wishes to proceed.The procedure has been discussed with the patient.  Alternative therapies have been discussed with the patient.  Operative risks include bleeding,  Infection,  Organ injury,  Nerve injury,  Blood vessel injury,  DVT,  Pulmonary embolism,  Death,  And possible reoperation.  Medical management risks include worsening of present situation.  The success of the procedure is 50 -90 % at treating patients symptoms.  The patient understands and agrees to proceed.    Description of procedure: The patient was met in the holding area questions answered.  He is taken back to the operative room placed upon upon the OR table.  After induction of general esthesia, was placed in lithotomy.  His dressings were taken down.  He had a open wound going from just above the pubic symphysis involving both the right and left testicle and the anterior two thirds of both scrotum and.  The penis was also had been debrided by urology.  Timeout performed.  He was prepped and draped in sterile fashion padded appropriate.  Upon examination the wound was clean with excellent granulation tissue.  Up inguinal canals was no further tissue debris.  The scrotum was examined.  The right testicle is examined.  There is a fluid collection around the testicle and swelling of the right seminal vesicle.  No abscess.  Left testicle is normal.  Penile shaft normal.  No  other necrotic or dead tissue and no evidence of any tracking.  Pulsavac was used to wash entire wound out.  Wet-to-dry dressings were then placed as before.  He was taken lithotomy extubated taken to recovery in satisfactory condition.  Of note there is no need for any further sharp debridement.  All counts found be correct.

## 2022-01-09 NOTE — Telephone Encounter (Signed)
Called and spoke with wife- will try to visit pt on Friday- if possible- ML

## 2022-01-09 NOTE — Progress Notes (Addendum)
Subjective:  The patient denies significant pain after third I&D today. Denies nausea and lightheadedness. No complaints.   Objective:  Vital signs in last 24 hours: Vitals:   01/09/22 1147 01/09/22 1200 01/09/22 1215 01/09/22 1230  BP: 134/80 (!) 156/79 (!) 137/98 (!) 143/91  Pulse: 65 65 (!) 59 66  Resp: '18 19 14 16  '$ Temp: 99.2 F (37.3 C)   98.7 F (37.1 C)  TempSrc:      SpO2: 98% 96% 96% 96%  Weight:      Height:       Weight change:   Intake/Output Summary (Last 24 hours) at 01/09/2022 1422 Last data filed at 01/09/2022 1143 Gross per 24 hour  Intake 1277.86 ml  Output 1600 ml  Net -322.14 ml    General: Appears to be resting comfortably in bed; No acute distress Cardiovascular: RRR; 2+ radial pulses Pulmonary: Normal work of breathing  Skin: Warm and dry; Wound dressing over scrotum, no visible swelling, erythema, bleeding, or drainage around dressing  Neuro:  Cannot accurately assess at this time, the patient is mildly sedated post-surgery; Sleepy, arousable, answers questions appropriately; Paraplegic   Assessment/Plan:  Principal Problem:   Fournier gangrene   Timothy Davenport 71 year T2DM on insulin, paraplegia T8 spinal cord injury, neurogenic bladder, CKD 3, HLD, prior CVA who presents for worsening left scrotal infection, admitted for fournier gangrene.    Sepsis secondary to fournier gangrene Seminal Vesical abscess The patient recieved third I&D today. He remains hemodynamically stable and WBC has normalized to 9.8. He is on Ceftriaxone day 2. Wound culture day 5 has still only grown Citrobacter koseri. Blood cultures day 4 show no growth. Surgery tolerated well per op note. Will follow urology and surgery's recommendations.  - Urology and surgery following, appreciate recommendations  - Ceftriaxone 2g q24 (day 2) - Ensure feeding supplement 2 times daily between meals  - Miralax 17g daily - Follow surgical and blood cultures  - Trend CBC - work  on discharge dispo for wound care management, PT and OT consult, will reach out to urology and surgery   T2DM on insulin  Holding home SGLT2 inhibitor in the setting of infection. CBG today is 96. Will encourage PO after NPO is lifted, and continue to monitor.  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  -Continue dantrolene for spasms  -Oxybutynin 7.5 mg twice daily   HTN Normotensive at this time. Home regimen includes losartan.  -Continue to hold losartan at this time.  Iron deficiency anemia   Hgb is 8.5. The patient had his third I&D today, so will continue to monitor.  -Trend CBC -Continue to hold home ferrous sulfate in setting of ongoing infection requiring further debridement  History of CVA Memory changes Recent mental status changes likely due to infection given its acute nature and worsening over the course of the infection. Will continue to monitor neuro and psych exam. Chronic memory changes will likely be addressed in the outpatient setting.    PTSD Depression -Continue home sertraline     LOS: 4 days   [name], Medical Student 01/09/2022, 2:22 PM   Attestation for Student Documentation:  I personally was present and performed or re-performed the history, physical exam and medical decision-making activities of this service and have verified that the service and findings are accurately documented in the student's note.  [name], DO  01/09/2022, 5:08 PM

## 2022-01-09 NOTE — Progress Notes (Signed)
Day of Surgery Subjective: No acute events overnight. Washout in OR today with gen surg.  WBC downtrending, VSS  Objective: Vital signs in last 24 hours: Temp:  [98.7 F (37.1 C)-99.2 F (37.3 C)] 98.8 F (37.1 C) (11/21 1728) Pulse Rate:  [59-85] 82 (11/21 1728) Resp:  [14-19] 17 (11/21 1728) BP: (126-156)/(79-98) 128/83 (11/21 1728) SpO2:  [96 %-100 %] 98 % (11/21 1728) Weight:  [77.1 kg] 77.1 kg (11/21 0953)  Intake/Output from previous day: 11/20 0701 - 11/21 0700 In: 827.9 [I.V.:727.9; IV Piggyback:100] Out: 1550 [Urine:1550] Intake/Output this shift: No intake/output data recorded.  Physical Exam:  General: Alert and oriented CV: RRR Lungs: Clear Ext: NT, No erythema  Fresh wound packing in place Foley draining clear yellow urine  Lab Results: Recent Labs    01/07/22 1405 01/08/22 0333 01/09/22 0234  HGB 9.8* 8.8* 8.5*  HCT 29.3* 26.5* 26.6*   BMET Recent Labs    01/07/22 1228 01/08/22 0333  NA 143 140  K 4.6 3.7  CL 109 108  CO2 21* 23  GLUCOSE 153* 150*  BUN 22 18  CREATININE 1.17 1.12  CALCIUM 9.7 9.8     Studies/Results: CT PELVIS W CONTRAST  Result Date: 01/08/2022 CLINICAL DATA:  Perianal abscess or fistula. EXAM: CT PELVIS WITH CONTRAST TECHNIQUE: Multidetector CT imaging of the pelvis was performed using the standard protocol following the bolus administration of intravenous contrast. RADIATION DOSE REDUCTION: This exam was performed according to the departmental dose-optimization program which includes automated exposure control, adjustment of the mA and/or kV according to patient size and/or use of iterative reconstruction technique. CONTRAST:  17m OMNIPAQUE IOHEXOL 350 MG/ML SOLN COMPARISON:  January 05, 2022. FINDINGS: Urinary Tract: Urinary bladder is decompressed secondary to Foley catheter. Visualized ureters are unremarkable. Bowel: There is no definite evidence of bowel obstruction. The appendix is unremarkable. There is no  definite evidence of perianal abscess. Vascular/Lymphatic: Atherosclerosis of visualized abdominal aorta is noted. No definite adenopathy is noted. Reproductive: Prostate gland is unremarkable. However, there is continued presence of 3.6 x 3.7 cm fluid collection or abscess in the region of the right seminal vesicle. Other: Large defect is seen involving the anterior pelvic subcutaneous tissues as well as in the perineum consistent with recent surgical resection for infection. Musculoskeletal: No significant osseous abnormality is noted. IMPRESSION: Large surgical defect is seen involving the subcutaneous tissues of the anterior pelvis and perineum. Continued presence of 3.7 x 3.6 cm fluid collection or abscess in the region of the right seminal vesicle. There is no definite evidence of perianal abscess currently. Aortic Atherosclerosis (ICD10-I70.0). Electronically Signed   By: JMarijo ConceptionM.D.   On: 01/08/2022 11:47    Assessment/Plan: GJOLLY BLEICHERis a 71yo M with Fournier's gangrene/necrotizing fasciitis s/p OR 11/7, 11/19, and 11/21.  --continue dressing changes --for pelvic abscess - patient clinically stable and white count down-trending on culture appropriate abx. Would recommend reimaging in a few days. If persistent, could consider IR drainage     LOS: 4 days   LDonald PoreMD 01/09/2022, 8:19 PM [facility]  Pager: [phone]

## 2022-01-10 ENCOUNTER — Encounter (HOSPITAL_COMMUNITY): Payer: Self-pay | Admitting: Surgery

## 2022-01-10 DIAGNOSIS — N493 Fournier gangrene: Secondary | ICD-10-CM | POA: Diagnosis not present

## 2022-01-10 LAB — CULTURE, BLOOD (ROUTINE X 2)
Culture: NO GROWTH
Culture: NO GROWTH

## 2022-01-10 LAB — BASIC METABOLIC PANEL
Anion gap: 9 (ref 5–15)
BUN: 13 mg/dL (ref 8–23)
CO2: 29 mmol/L (ref 22–32)
Calcium: 9.7 mg/dL (ref 8.9–10.3)
Chloride: 103 mmol/L (ref 98–111)
Creatinine, Ser: 0.9 mg/dL (ref 0.61–1.24)
GFR, Estimated: >60 mL/min (ref >60)
Glucose, Bld: 115 mg/dL — ABNORMAL HIGH (ref 70–99)
Potassium: 3.6 mmol/L (ref 3.5–5.1)
Sodium: 141 mmol/L (ref 135–145)

## 2022-01-10 LAB — CBC
HCT: 27.4 % — ABNORMAL LOW (ref 39.0–52.0)
Hemoglobin: 8.9 g/dL — ABNORMAL LOW (ref 13.0–17.0)
MCH: 25.3 pg — ABNORMAL LOW (ref 26.0–34.0)
MCHC: 32.5 g/dL (ref 30.0–36.0)
MCV: 77.8 fL — ABNORMAL LOW (ref 80.0–100.0)
Platelets: 191 10*3/uL (ref 150–400)
RBC: 3.52 MIL/uL — ABNORMAL LOW (ref 4.22–5.81)
RDW: 16.3 % — ABNORMAL HIGH (ref 11.5–15.5)
WBC: 9.8 10*3/uL (ref 4.0–10.5)
nRBC: 0 % (ref 0.0–0.2)

## 2022-01-10 LAB — AEROBIC/ANAEROBIC CULTURE W GRAM STAIN (SURGICAL/DEEP WOUND)

## 2022-01-10 LAB — GLUCOSE, CAPILLARY
Glucose-Capillary: 115 mg/dL — ABNORMAL HIGH (ref 70–99)
Glucose-Capillary: 139 mg/dL — ABNORMAL HIGH (ref 70–99)
Glucose-Capillary: 132 mg/dL — ABNORMAL HIGH (ref 70–99)
Glucose-Capillary: 138 mg/dL — ABNORMAL HIGH (ref 70–99)

## 2022-01-10 LAB — MAGNESIUM: Magnesium: 2 mg/dL (ref 1.7–2.4)

## 2022-01-10 LAB — PHOSPHORUS: Phosphorus: 1.9 mg/dL — ABNORMAL LOW (ref 2.5–4.6)

## 2022-01-10 MED ORDER — POTASSIUM PHOSPHATES 15 MMOLE/5ML IV SOLN
30.0000 mmol | Freq: Once | INTRAVENOUS | Status: AC
  Administered 2022-01-10: 30 mmol via INTRAVENOUS
  Filled 2022-01-10: qty 10

## 2022-01-10 MED ORDER — HYDROMORPHONE HCL 1 MG/ML IJ SOLN
0.5000 mg | INTRAMUSCULAR | Status: DC | PRN
  Administered 2022-01-10 – 2022-01-14 (×7): 0.5 mg via INTRAVENOUS
  Filled 2022-01-10 (×7): qty 0.5

## 2022-01-10 MED ORDER — DANTROLENE SODIUM 25 MG PO CAPS
25.0000 mg | ORAL_CAPSULE | Freq: Three times a day (TID) | ORAL | Status: DC
  Administered 2022-01-10 – 2022-01-24 (×40): 25 mg via ORAL
  Filled 2022-01-10 (×48): qty 1

## 2022-01-10 MED ORDER — OXYCODONE HCL 5 MG PO TABS
5.0000 mg | ORAL_TABLET | ORAL | Status: DC | PRN
  Administered 2022-01-12 – 2022-01-17 (×2): 5 mg via ORAL
  Administered 2022-01-22: 10 mg via ORAL
  Filled 2022-01-10 (×2): qty 1
  Filled 2022-01-10: qty 2
  Filled 2022-01-10: qty 1

## 2022-01-10 MED ORDER — CHLORHEXIDINE GLUCONATE CLOTH 2 % EX PADS
6.0000 | MEDICATED_PAD | Freq: Every day | CUTANEOUS | Status: DC
  Administered 2022-01-10 – 2022-01-24 (×14): 6 via TOPICAL

## 2022-01-10 MED ORDER — ACETAMINOPHEN 500 MG PO TABS
1000.0000 mg | ORAL_TABLET | Freq: Four times a day (QID) | ORAL | Status: DC | PRN
  Administered 2022-01-15 – 2022-01-23 (×2): 1000 mg via ORAL
  Filled 2022-01-10 (×2): qty 2

## 2022-01-10 MED ORDER — ENOXAPARIN SODIUM 40 MG/0.4ML IJ SOSY
40.0000 mg | PREFILLED_SYRINGE | Freq: Every day | INTRAMUSCULAR | Status: DC
  Administered 2022-01-10 – 2022-01-18 (×9): 40 mg via SUBCUTANEOUS
  Filled 2022-01-10 (×9): qty 0.4

## 2022-01-10 MED ORDER — CEFAZOLIN SODIUM-DEXTROSE 2-4 GM/100ML-% IV SOLN
2.0000 g | Freq: Three times a day (TID) | INTRAVENOUS | Status: DC
  Administered 2022-01-11 – 2022-01-12 (×3): 2 g via INTRAVENOUS
  Filled 2022-01-10 (×3): qty 100

## 2022-01-10 NOTE — Progress Notes (Addendum)
1 Day Post-Op  Subjective:  No pain this am. Still low appetite and has not eaten yet at time of my visit  Objective: Vital signs in last 24 hours: Temp:  [98.6 F (37 C)-99.2 F (37.3 C)] 98.6 F (37 C) (11/22 0704) Pulse Rate:  [59-82] 72 (11/22 0704) Resp:  [14-19] 18 (11/22 0704) BP: (128-156)/(79-98) 142/88 (11/22 0704) SpO2:  [96 %-100 %] 100 % (11/22 0704) Weight:  [77.1 kg] 77.1 kg (11/21 0953) Last BM Date : 01/09/22  Intake/Output from previous day: 11/21 0701 - 11/22 0700 In: 850 [P.O.:300; I.V.:450; IV Piggyback:100] Out: 400 [Urine:400] Intake/Output this shift: Total I/O In: -  Out: 600 [Urine:600]  PE: General: NAD Resp: normal work of breathing Abd/GU: dressings in place, foley draining clear yellow urine.  Lab Results:  Recent Labs    01/09/22 0234 01/10/22 0355  WBC 9.8 9.8  HGB 8.5* 8.9*  HCT 26.6* 27.4*  PLT 178 191    BMET Recent Labs    01/08/22 0333 01/10/22 0355  NA 140 141  K 3.7 3.6  CL 108 103  CO2 23 29  GLUCOSE 150* 115*  BUN 18 13  CREATININE 1.12 0.90  CALCIUM 9.8 9.7    PT/INR No results for input(s): "LABPROT", "INR" in the last 72 hours. CMP     Component Value Date/Time   NA 141 01/10/2022 0355   NA 137 03/08/2021 1137   K 3.6 01/10/2022 0355   CL 103 01/10/2022 0355   CO2 29 01/10/2022 0355   GLUCOSE 115 (H) 01/10/2022 0355   BUN 13 01/10/2022 0355   BUN 17 03/08/2021 1137   CREATININE 0.90 01/10/2022 0355   CALCIUM 9.7 01/10/2022 0355   PROT 7.7 01/05/2022 0900   PROT 7.9 03/08/2021 1137   ALBUMIN 2.6 (L) 01/05/2022 0900   ALBUMIN 3.9 03/08/2021 1137   AST 16 01/05/2022 0900   ALT 12 01/05/2022 0900   ALKPHOS 71 01/05/2022 0900   BILITOT 0.6 01/05/2022 0900   BILITOT 0.2 03/08/2021 1137   GFRNONAA >60 01/10/2022 0355   GFRAA >60 07/20/2019 0550   Lipase  No results found for: "LIPASE"     Studies/Results: CT PELVIS W CONTRAST  Result Date: 01/08/2022 CLINICAL DATA:  Perianal  abscess or fistula. EXAM: CT PELVIS WITH CONTRAST TECHNIQUE: Multidetector CT imaging of the pelvis was performed using the standard protocol following the bolus administration of intravenous contrast. RADIATION DOSE REDUCTION: This exam was performed according to the departmental dose-optimization program which includes automated exposure control, adjustment of the mA and/or kV according to patient size and/or use of iterative reconstruction technique. CONTRAST:  75m OMNIPAQUE IOHEXOL 350 MG/ML SOLN COMPARISON:  January 05, 2022. FINDINGS: Urinary Tract: Urinary bladder is decompressed secondary to Foley catheter. Visualized ureters are unremarkable. Bowel: There is no definite evidence of bowel obstruction. The appendix is unremarkable. There is no definite evidence of perianal abscess. Vascular/Lymphatic: Atherosclerosis of visualized abdominal aorta is noted. No definite adenopathy is noted. Reproductive: Prostate gland is unremarkable. However, there is continued presence of 3.6 x 3.7 cm fluid collection or abscess in the region of the right seminal vesicle. Other: Large defect is seen involving the anterior pelvic subcutaneous tissues as well as in the perineum consistent with recent surgical resection for infection. Musculoskeletal: No significant osseous abnormality is noted. IMPRESSION: Large surgical defect is seen involving the subcutaneous tissues of the anterior pelvis and perineum. Continued presence of 3.7 x 3.6 cm fluid collection or abscess in the region  of the right seminal vesicle. There is no definite evidence of perianal abscess currently. Aortic Atherosclerosis (ICD10-I70.0). Electronically Signed   By: Marijo Conception M.D.   On: 01/08/2022 11:47    Anti-infectives: Anti-infectives (From admission, onward)    Start     Dose/Rate Route Frequency Ordered Stop   01/08/22 1400  cefTRIAXone (ROCEPHIN) 2 g in sodium chloride 0.9 % 100 mL IVPB        2 g 200 mL/hr over 30 Minutes Intravenous  Every 24 hours 01/08/22 1250     01/07/22 1500  piperacillin-tazobactam (ZOSYN) IVPB 3.375 g  Status:  Discontinued        3.375 g 12.5 mL/hr over 240 Minutes Intravenous Every 8 hours 01/07/22 1357 01/08/22 1244   01/06/22 1100  vancomycin (VANCOCIN) IVPB 1000 mg/200 mL premix  Status:  Discontinued        1,000 mg 200 mL/hr over 60 Minutes Intravenous Every 24 hours 01/05/22 0946 01/05/22 0949   01/06/22 0000  vancomycin variable dose per unstable renal function (pharmacist dosing)  Status:  Discontinued         Does not apply See admin instructions 01/05/22 0949 01/05/22 1514   01/05/22 2200  ceFEPIme (MAXIPIME) 2 g in sodium chloride 0.9 % 100 mL IVPB  Status:  Discontinued        2 g 200 mL/hr over 30 Minutes Intravenous Every 12 hours 01/05/22 0946 01/05/22 1514   01/05/22 2000  piperacillin-tazobactam (ZOSYN) IVPB 3.375 g  Status:  Discontinued        3.375 g 12.5 mL/hr over 240 Minutes Intravenous Every 8 hours 01/05/22 1711 01/07/22 1250   01/05/22 1515  linezolid (ZYVOX) IVPB 600 mg  Status:  Discontinued        600 mg 300 mL/hr over 60 Minutes Intravenous Every 12 hours 01/05/22 1514 01/08/22 1244   01/05/22 0945  ceFEPIme (MAXIPIME) 2 g in sodium chloride 0.9 % 100 mL IVPB        2 g 200 mL/hr over 30 Minutes Intravenous  Once 01/05/22 0932 01/05/22 1038   01/05/22 0945  metroNIDAZOLE (FLAGYL) IVPB 500 mg        500 mg 100 mL/hr over 60 Minutes Intravenous  Once 01/05/22 0932 01/05/22 1103   01/05/22 0945  vancomycin (VANCOCIN) IVPB 1000 mg/200 mL premix  Status:  Discontinued        1,000 mg 200 mL/hr over 60 Minutes Intravenous  Once 01/05/22 0932 01/05/22 0944   01/05/22 0945  vancomycin (VANCOREADY) IVPB 2000 mg/400 mL        2,000 mg 200 mL/hr over 120 Minutes Intravenous  Once 01/05/22 0944 01/05/22 1256        Assessment/Plan 71 yo male with 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  POD3 s/p debridement, POD2 second look  debridement by Dr. Zenia Resides and Dr. Cain Sieve 11/17 and 11/19 POD1 wound exploration and washout 11/21 Dr. Brantley Stage - OR findings yesterday with no indication for repeat sharp debridement. Will coordinate with urology first bedside dressing change today and then bid WTD ongoing - afebrile and no leukocytosis on abx. Pelvic abscess on CT. 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

## 2022-01-10 NOTE — Progress Notes (Signed)
Patient ID: Timothy Davenport, male   DOB: September 05, 1950, 71 y.o.   MRN: [id number]  1 Day Post-Op Subjective: Patient stable without new complaints today.  Resting comfortably in bed.  Objective: Vital signs in last 24 hours: Temp:  [98.3 F (36.8 C)-98.9 F (37.2 C)] 98.3 F (36.8 C) (11/22 1505) Pulse Rate:  [67-79] 79 (11/22 1505) Resp:  [16-18] 16 (11/22 1505) BP: (120-142)/(82-94) 120/82 (11/22 1505) SpO2:  [99 %-100 %] 100 % (11/22 1505)  Intake/Output from previous day: 11/21 0701 - 11/22 0700 In: 850 [P.O.:300; I.V.:450; IV Piggyback:100] Out: 400 [Urine:400] Intake/Output this shift: Total I/O In: 240 [P.O.:240] Out: 600 [Urine:600]  Physical Exam:  General: Alert and oriented GU: His dressings were taken down.  He does have the beginning of good granulation tissue forming.  There is minimal fibrinous tissue in the posterior aspect of the wound posterior to the scrotum.  No further necrotic tissue noted.  His dressings were reapplied with saline wet-to-dry Kerlix.  Lab Results: Recent Labs    01/08/22 0333 01/09/22 0234 01/10/22 0355  HGB 8.8* 8.5* 8.9*  HCT 26.5* 26.6* 27.4*      Latest Ref Rng & Units 01/10/2022    3:55 AM 01/09/2022    2:34 AM 01/08/2022    3:33 AM  CBC  WBC 4.0 - 10.5 K/uL 9.8  9.8  13.2   Hemoglobin 13.0 - 17.0 g/dL 8.9  8.5  8.8   Hematocrit 39.0 - 52.0 % 27.4  26.6  26.5   Platelets 150 - 400 K/uL 191  178  162      BMET Recent Labs    01/08/22 0333 01/10/22 0355  NA 140 141  K 3.7 3.6  CL 108 103  CO2 23 29  GLUCOSE 150* 115*  BUN 18 13  CREATININE 1.12 0.90  CALCIUM 9.8 9.7     Studies/Results: No results found.  Assessment/Plan: 1.  Fournier's gangrene: Continue local wound care with twice daily wet-to-dry dressings of the penis and testes, etc.  He may benefit from repeat imaging of the pelvis to reassess the initial fluid collection noted in the vicinity of the seminal vesicle on admission.   LOS: 5 days   Dutch Gray 01/10/2022, 6:27 PM

## 2022-01-10 NOTE — Evaluation (Signed)
Occupational Therapy Evaluation Patient Details Name: Timothy Davenport MRN: [id number] DOB: January 07, 1951 Today's Date: 01/10/2022   History of Present Illness Pt is a 71 yo male admitted for gangrene of the scrotum.  Pt with excisional debridement of B groin and lower abdomen. Third I&D of wound on 11/21.  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 PHM: T8 incomplete paraplegia with neurogenic bladder.   Clinical Impression   Pt admitted with the above diagnosis and has the deficits listed below. Pt would benefit from further OT to attempt to reach supervision level with most adls which he was at [facility]. Pt lives with wife at home and has a caregiver that comes 15 hours a week to assist with cooking and cleaning.  Feel pt needs education on bowel and bladder care and risks of shearing in transfers prior to d/c  home to prevent further injury and wounds to pt's scrotal areas.  Pt is very weak and no longer able to stand on his own and therefore will need therapy prior to returning home.  Feel SNF may be pt's best option given wounds and debility.     Recommendations for follow up therapy are one component of a multi-disciplinary discharge planning process, led by the attending physician.  Recommendations may be updated based on patient status, additional functional criteria and insurance authorization.   Follow Up Recommendations  Skilled nursing-short term rehab (<3 hours/day)     Assistance Recommended at Discharge Frequent or constant Supervision/Assistance  Patient can return home with the following Two people to help with walking and/or transfers;A lot of help with bathing/dressing/bathroom;Assistance with cooking/housework;Assist for transportation;Help with stairs or ramp for entrance    Functional Status Assessment  Patient has had a recent decline in their functional status and demonstrates the ability to make significant improvements in function in a reasonable and predictable amount of time.   Equipment Recommendations  None recommended by OT    Recommendations for Other Services       Precautions / Restrictions Precautions Precautions: Fall;Other (comment) (wound care) Precaution Comments: significant open abdominal/groin wound Required Braces or Orthoses: Other Brace (Pt with significant dressing to groin area) Other Brace: significant dressing to groin area held in place with mesh underwear Restrictions Weight Bearing Restrictions: No Other Position/Activity Restrictions: Avoid shearing during transfers and bed mobility due to groin debridement.      Mobility Bed Mobility Overal bed mobility: Needs Assistance Bed Mobility: Rolling, Sidelying to Sit, Sit to Supine Rolling: Mod assist Sidelying to sit: Mod assist   Sit to supine: Mod assist, +2 for physical assistance   General bed mobility comments: Bed mobility is more difficult due to nature of pts injury.  Feel pt sheers a lot PTA with bed mobilty and with recent scrotal surgery feel sheering should be avoided.  Educated pt but follow up necessary with wife and caregiver.    Transfers Overall transfer level: Needs assistance Equipment used: 2 person hand held assist Transfers: Sit to/from Stand, Bed to chair/wheelchair/BSC Sit to Stand: Max assist, +2 physical assistance   Squat pivot transfers: +2 physical assistance, Max assist       General transfer comment: Pt with great difficulty coming to a full stand this am and being able to extend knees out to come to full stand.      Balance Overall balance assessment: Needs assistance Sitting-balance support: Feet supported Sitting balance-Leahy Scale: Fair     Standing balance support: During functional activity, Bilateral upper extremity supported, Reliant on assistive device  for balance Standing balance-Leahy Scale: Zero Standing balance comment: Pt knees did not get into full standing without +2 assist.                           ADL  either performed or assessed with clinical judgement   ADL Overall ADL's : Needs assistance/impaired Eating/Feeding: Set up;Sitting   Grooming: Wash/dry hands;Wash/dry face;Oral care;Applying deodorant;Brushing hair;Set up;Sitting   Upper Body Bathing: Set up;Sitting   Lower Body Bathing: Moderate assistance;Sitting/lateral leans;Bed level Lower Body Bathing Details (indicate cue type and reason): at bed level or at EOB leaning to wash backside. pt unabe to stand without significant assist at this point Upper Body Dressing : Minimal assistance;Sitting   Lower Body Dressing: Moderate assistance;Sitting/lateral leans;Bed level;Cueing for compensatory techniques   Toilet Transfer: Maximal assistance;+2 for physical assistance;BSC/3in1;Squat-pivot   Toileting- Clothing Manipulation and Hygiene: Total assistance;Bed level Toileting - Clothing Manipulation Details (indicate cue type and reason): Pt had bowel movement and said he "thought something was down there but was not sure."  Curious how much this happens at home. Will need to ensure cleanliness with bowel routine at d/c du to nature of his surgery.     Functional mobility during ADLs: Maximal assistance;+2 for physical assistance;Rolling walker (2 wheels) General ADL Comments: Pt limited with adls at this point due to weakness in legs and inability to stand without assist. Explained to pt his  need to avoid sheering and to be sure to be very clean after bowel movement.     Vision Baseline Vision/History: 0 No visual deficits Ability to See in Adequate Light: 0 Adequate Patient Visual Report: No change from baseline Vision Assessment?: No apparent visual deficits     Perception Perception Perception Tested?: No   Praxis Praxis Praxis tested?: Within functional limits    Pertinent Vitals/Pain Pain Assessment Pain Assessment: Faces Faces Pain Scale: Hurts a little bit Pain Location: abdomen Pain Descriptors / Indicators:  Discomfort Pain Intervention(s): Monitored during session     Hand Dominance Right   Extremity/Trunk Assessment Upper Extremity Assessment Upper Extremity Assessment: Overall WFL for tasks assessed   Lower Extremity Assessment Lower Extremity Assessment: Defer to PT evaluation   Cervical / Trunk Assessment Cervical / Trunk Assessment: Back Surgery   Communication Communication Communication: No difficulties   Cognition Arousal/Alertness: Awake/alert Behavior During Therapy: WFL for tasks assessed/performed Overall Cognitive Status: Within Functional Limits for tasks assessed                                 General Comments: Pt was off with stating the year as 2024 and occasionally conversation was confusing in regards to places he had recently been. Pt answered 911 safety questions and date accurately later in session.     General Comments  Pt limited by leg weakness and significant wound issues.  Feel pt will need SNF level of care to heal before wife can handle this at home even with a caregiver.    Exercises     Shoulder Instructions      Home Living Family/patient expects to be discharged to:: Private residence Living Arrangements: Spouse/significant other Available Help at Discharge: Family;Personal care attendant Type of Home: House Home Access: Ramped entrance     Home Layout: One level     Bathroom Shower/Tub: Occupational psychologist: Handicapped height Bathroom Accessibility: Yes   Home Equipment: Wheelchair -  manual;Rolling Walker (2 wheels);Other (comment);Toilet riser;Cane - single point;Wheelchair - power;Hospital bed;Shower seat   Additional Comments: PCA assist 3x/wk for 3 hours      Prior Functioning/Environment Prior Level of Function : Needs assist             Mobility Comments: can walk short distances with RW, mostly uses w/c ADLs Comments: aide 15 hours/week, comes every other day. Aide helps prepare  breakfast, clean up. Pt indep with in/out caths, sometimes has to stimulate BMs, otherwise does own adls at w/c level        OT Problem List: Decreased strength;Impaired balance (sitting and/or standing);Decreased knowledge of use of DME or AE;Decreased knowledge of precautions      OT Treatment/Interventions: Self-care/ADL training;Therapeutic activities;DME and/or AE instruction;Balance training;Patient/family education    OT Goals(Current goals can be found in the care plan section) Acute Rehab OT Goals Patient Stated Goal: to get stronger OT Goal Formulation: With patient Time For Goal Achievement: 01/24/22 Potential to Achieve Goals: Fair ADL Goals Pt Will Perform Lower Body Bathing: with min assist;sitting/lateral leans Pt Will Perform Lower Body Dressing: with supervision;sitting/lateral leans;bed level Pt Will Transfer to Toilet: with min assist;squat pivot transfer;bedside commode Pt Will Perform Toileting - Clothing Manipulation and hygiene: sitting/lateral leans;with min assist Additional ADL Goal #1: Pt will independently state the reasons why cleaning up directly after bowel movement and boosting during trasnfers are important to his wound care routine.  OT Frequency: Min 2X/week    Co-evaluation PT/OT/SLP Co-Evaluation/Treatment: Yes Reason for Co-Treatment: Complexity of the patient's impairments (multi-system involvement) PT goals addressed during session: Mobility/safety with mobility OT goals addressed during session: ADL's and self-care      AM-PAC OT "6 Clicks" Daily Activity     Outcome Measure Help from another person eating meals?: None Help from another person taking care of personal grooming?: None Help from another person toileting, which includes using toliet, bedpan, or urinal?: Total Help from another person bathing (including washing, rinsing, drying)?: A Lot Help from another person to put on and taking off regular upper body clothing?: A  Little Help from another person to put on and taking off regular lower body clothing?: A Lot 6 Click Score: 16   End of Session Equipment Utilized During Treatment: Rolling walker (2 wheels) Nurse Communication: Mobility status  Activity Tolerance: Patient tolerated treatment well Patient left: in chair;with call bell/phone within reach;with chair alarm set  OT Visit Diagnosis: Unsteadiness on feet (R26.81)                Time: 6283-1517 OT Time Calculation (min): 34 min Charges:  OT General Charges $OT Visit: 1 Visit OT Evaluation $OT Eval Moderate Complexity: 1 Mod  Glenford Peers 01/10/2022, 10:53 AM

## 2022-01-10 NOTE — TOC Initial Note (Addendum)
Transition of Care [facility]) - Initial/Assessment Note    Patient Details  Name: Timothy Davenport MRN: [id number] Date of Birth: 1950-10-02  Transition of Care [facility]) CM/SW Contact:    Timothy Davenport, [place] Field Phone Number: 01/10/2022, 4:50 PM  Clinical Narrative:                  CSW received consult for possible SNF placement at time of discharge. CSW spoke with patient regarding PT recommendation of SNF placement at time of discharge. Patient reports he comes from home with spouse. Patient expressed understanding of PT recommendation and is agreeable to SNF placement at time of discharge. Patient gave CSW permission to fax out initial referral near the [place] area. CSW discussed insurance authorization process and will provide Medicare SNF ratings list with accepted SNF bed offers when available. Patient agreeable to use his medicare benefits for SNF placement.Patient reports he is COVID vaccinated. No further questions reported at this time. CSW to continue to follow and assist with discharge planning needs.   Expected Discharge Plan: Skilled Nursing Facility Barriers to Discharge: Continued Medical Work up   Patient Goals and CMS Choice Patient states their goals for this hospitalization and ongoing recovery are:: SNF CMS Medicare.gov Compare Post Acute Care list provided to:: Patient Choice offered to / list presented to : Patient  Expected Discharge Plan and Services Expected Discharge Plan: [place] In-house Referral: Clinical Social Work     Living arrangements for the past 2 months: [place]                                      Prior Living Arrangements/Services Living arrangements for the past 2 months: Single Family Home Lives with:: Spouse Patient language and need for interpreter reviewed:: Yes Do you feel safe going back to the place where you live?: No   SNF  Need for Family Participation in Patient Care: Yes (Comment) Care giver  support system in place?: Yes (comment)   Criminal Activity/Legal Involvement Pertinent to Current Situation/Hospitalization: No - Comment as needed  Activities of Daily Living Home Assistive Devices/Equipment: Wheelchair, Shower chair with back ADL Screening (condition at time of admission) Patient's cognitive ability adequate to safely complete daily activities?: No Is the patient deaf or have difficulty hearing?: No Does the patient have difficulty seeing, even when wearing glasses/contacts?: No Does the patient have difficulty concentrating, remembering, or making decisions?: Yes Patient able to express need for assistance with ADLs?: Yes Does the patient have difficulty dressing or bathing?: Yes Independently performs ADLs?: No Communication: Independent Dressing (OT): Dependent Is this a change from baseline?: Pre-admission baseline Grooming: Needs assistance Is this a change from baseline?: Pre-admission baseline Feeding: Appropriate for developmental age Bathing: Dependent Is this a change from baseline?: Pre-admission baseline Toileting: Dependent Is this a change from baseline?: Pre-admission baseline In/Out Bed: Dependent Is this a change from baseline?: Pre-admission baseline Walks in Home: Dependent (WC bound) Is this a change from baseline?: Pre-admission baseline Does the patient have difficulty walking or climbing stairs?: Yes Weakness of Legs: Both Weakness of Arms/Hands: None  Permission Sought/Granted Permission sought to share information with : Case Manager, Family Supports, Chartered certified accountant granted to share information with : Yes, Verbal Permission Granted  Share Information with NAME: Timothy Davenport  Permission granted to share info w AGENCY: SNF  Permission granted to share info w Relationship: spouse  Permission granted  to share info w Contact Information: Timothy Davenport [phone]  Emotional Assessment Appearance:: Appears stated  age Attitude/Demeanor/Rapport: Gracious Affect (typically observed): Calm Orientation: : Oriented to Self, Oriented to Place, Oriented to Situation Alcohol / Substance Use: Not Applicable Psych Involvement: No (comment)  Admission diagnosis:  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] Patient Active Problem List   Diagnosis Date Noted   Fournier gangrene 01/05/2022   Malnutrition of moderate degree 04/25/2021   Liver lesion    Urinary tract infection with hematuria    Lower GI bleed 04/24/2021   Anemia 04/24/2021   Essential hypertension 04/24/2021   Diabetes mellitus type 2 in nonobese ([place]) 04/24/2021   Acute GI bleeding 04/24/2021   Erectile dysfunction due to diseases classified elsewhere 03/02/2020   Wheelchair dependence 11/27/2019   Spasticity 08/03/2019   Paraplegia ([place]) 07/07/2019   Neurogenic bowel 07/07/2019   Neurogenic bladder 07/07/2019   Thoracic myelopathy 06/30/2019   PCP:  Timothy Catalan, PA Pharmacy:   [id number] [address] [place] [address] Phone: [phone] Fax: [name] [address] Phone: [phone] Fax: [phone]  [facility] [address] [name] [address] Phone: [phone] Fax: [phone]     Social Determinants of Health (SDOH) Interventions    Readmission Risk Interventions    04/26/2021    1:38 PM  Readmission Risk Prevention Plan  Post Dischage Appt Complete  Medication Screening Complete  Transportation Screening Complete

## 2022-01-10 NOTE — Progress Notes (Addendum)
Subjective:  The patient reports he feels good today. He still has low appetite, but he has been able to eat a small amount of breakfast and the Ensure protein shakes without difficulty. Denies nausea and vomiting. He denies pain in his groin, and believes his pain is well controlled on medication. His last bowel movement was this morning, and it was a normal bowel movement. The patient and his wife prefer wound care at [facility] for dispo.   Objective:  Vital signs in last 24 hours: Vitals:   01/09/22 2138 01/10/22 0704 01/10/22 0854 01/10/22 1505  BP: (!) 138/94 (!) 142/88 (!) 140/84 120/82  Pulse: 77 72 67 79  Resp: '18 18 17 16  '$ Temp: 98.9 F (37.2 C) 98.6 F (37 C) 98.9 F (37.2 C) 98.3 F (36.8 C)  TempSrc: Oral Oral Oral Oral  SpO2: 99% 100% 100% 100%  Weight:      Height:       Weight change:   Intake/Output Summary (Last 24 hours) at 01/10/2022 1516 Last data filed at 01/10/2022 0900 Gross per 24 hour  Intake 420 ml  Output 750 ml  Net -330 ml   General: Appears to be resting comfortably in bed; No acute distress Cardiovascular: RRR; no murmurs, rubs, or gallops; 2+ radial pulses Pulmonary: Normal work of breathing; Lungs clear to auscultation anteriorly; no wheezes, rales, or rhonchi  Extremities: - Right heel: No evidence of skin breakdown  - Left heel: darker than the right, does not blanch well; non-tender Skin: Warm and dry; Wound dressing over perineum, no visible swelling, erythema, bleeding, or drainage beyond the dressing   Neuro: No focal deficits    Assessment/Plan:  Principal Problem:   Fournier gangrene  Timothy Davenport 71 year T2DM on insulin, paraplegia T8 spinal cord injury, neurogenic bladder, CKD 3, HLD, prior CVA who presents for worsening left scrotal infection, admitted for fournier gangrene.      Sepsis secondary to fournier gangrene Seminal Vesical abscess The patient is status post third I&D yesterday, 11/21. Per op note, this was  tolerated well and the patient no longer requires repeat sharp debridement. He remains hemodynamically stable, WBC is 9.8. Would culture day 6 has only grown Citrobacter koseri. Blood cultures day 5 show no growth. He is on antibiotics day 6, Ceftriaxone day 3. Plan to further narrow antibiotics to Cefazolin. Urology would like to repeat imaging in a few days, and if pelvic abscess persists, will consider IR drainage. The patient and his wife prefer wound care at SNF, so will explore options for anticipated dispo early next week. Will also have heel covers placed to avoid skin breakdown during admission. Will follow urology and surgery's recommendations.   - Urology and surgery following, appreciate recommendations  - Stop IV Ceftriaxone 2g q24 (day 3) - Start IV Cefazolin 2g q8 (day 1) - Repeat CT A/P Friday 11/24 or Saturday 11/25 - Ensure feeding supplement 2 times daily between meals  - Miralax 17g daily - Follow surgical and blood cultures  - Trend CBC - Heel covers - work on discharge dispo for wound care management, PT and OT consult, will reach out to urology and surgery   T2DM on insulin  Holding home SGLT2 inhibitor in the setting of infection (this will actually be discontinued permanently). CBG today is 115. Will encourage PO and continue to monitor.  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 We clarified  with the [facility] hospital that the patient is prescribed Dantrolene 25 mg TID, so will continue this dose inpatient.  -Continue dantrolene 25 mg TID for spasms  -Oxybutynin 7.5 mg twice daily   HTN Normotensive at this time. Home regimen includes losartan.  -Continue to hold losartan at this time.   Iron deficiency anemia   Hgb is 8.9. Hemoglobin and WBC have remained stable for the past couple days, so will discontinue daily CBC.  -Continue to hold home ferrous sulfate in setting of ongoing infection  requiring further debridement   History of CVA Memory changes Recent mental status changes likely due to infection given its acute nature and worsening over the course of the infection. Will continue to monitor neuro and psych exam. Chronic memory changes will likely be addressed in the outpatient setting.    PTSD Depression -Continue home sertraline    LOS: 5 days   Timothy Davenport, Medical Student 01/10/2022, 3:16 PM   Attestation for Student Documentation:  I personally was present and performed or re-performed the history, physical exam and medical decision-making activities of this service and have verified that the service and findings are accurately documented in the student's note.  [name], DO 01/10/2022, 4:11 PM

## 2022-01-10 NOTE — Evaluation (Signed)
Physical Therapy Evaluation Patient Details Name: Timothy Davenport MRN: [id number] DOB: Aug 20, 1950 Today's Date: 01/10/2022  History of Present Illness  Pt is a 71 yo male admitted for gangrene of the scrotum.  Pt with excisional debridement of B groin and lower abdomen. Third I&D of wound on 11/21.  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 PHM: T8 incomplete paraplegia with neurogenic bladder.  Clinical Impression   Pt presents with LE weakness and impaired sensation chronic from SCI, max difficulty mobilizing, impaired balance, and decreased activity tolerance. Pt to benefit from acute PT to address deficits. Pt requiring mod-max +2 for transfer-level mobility at this time, states at baseline pt able to transfer to/from w/c without assist and even walks short distances with RW. Given nature of wounds and significant assist needed for mobility, PT recommending ST-SNF for return to PLOF. PT to progress mobility as tolerated, and will continue to follow acutely.         Recommendations for follow up therapy are one component of a multi-disciplinary discharge planning process, led by the attending physician.  Recommendations may be updated based on patient status, additional functional criteria and insurance authorization.  Follow Up Recommendations Skilled nursing-short term rehab (<3 hours/day) Can patient physically be transported by private vehicle: No    Assistance Recommended at Discharge Frequent or constant Supervision/Assistance  Patient can return home with the following  Two people to help with walking and/or transfers;Two people to help with bathing/dressing/bathroom;Direct supervision/assist for medications management;Help with stairs or ramp for entrance;Assist for transportation    Equipment Recommendations None recommended by PT  Recommendations for Other Services       Functional Status Assessment Patient has had a recent decline in their functional status and demonstrates the ability  to make significant improvements in function in a reasonable and predictable amount of time.     Precautions / Restrictions Precautions Precautions: Fall;Other (comment) (wound care) Precaution Comments: significant open abdominal/groin wound Required Braces or Orthoses: Other Brace (Pt with significant dressing to groin area) Other Brace: bulky dressing to groin area held in place with mesh underwear Restrictions Weight Bearing Restrictions: No Other Position/Activity Restrictions: Avoid shearing during transfers and bed mobility due to groin debridement.      Mobility  Bed Mobility Overal bed mobility: Needs Assistance Bed Mobility: Rolling, Sidelying to Sit, Sit to Supine Rolling: Mod assist Sidelying to sit: Mod assist   Sit to supine: Mod assist, +2 for physical assistance   General bed mobility comments: Bed mobility is more difficult due to nature of pts injury.  Feel pt sheers a lot PTA with bed mobilty and with recent scrotal surgery feel sheering should be avoided.  Educated pt but follow up necessary with wife and caregiver.    Transfers Overall transfer level: Needs assistance Equipment used: 2 person hand held assist Transfers: Sit to/from Stand, Bed to chair/wheelchair/BSC Sit to Stand: Max assist, +2 physical assistance     Squat pivot transfers: +2 physical assistance, Max assist     General transfer comment: Pt with great difficulty coming to a full stand this am and being able to extend knees out to come to full stand.    Ambulation/Gait                  Stairs            Wheelchair Mobility    Modified Rankin (Stroke Patients Only)       Balance Overall balance assessment: Needs assistance Sitting-balance support: Feet supported  Sitting balance-Leahy Scale: Fair     Standing balance support: During functional activity, Bilateral upper extremity supported, Reliant on assistive device for balance Standing balance-Leahy Scale:  Zero Standing balance comment: lacks full extension in bilat LEs when in stand                             Pertinent Vitals/Pain Pain Assessment Pain Assessment: Faces Faces Pain Scale: Hurts little more Pain Location: scrotum Pain Descriptors / Indicators: Discomfort, Sore Pain Intervention(s): Limited activity within patient's tolerance, Monitored during session, Repositioned    Home Living Family/patient expects to be discharged to:: Private residence Living Arrangements: Spouse/significant other Available Help at Discharge: Family;Personal care attendant Type of Home: House Home Access: Ramped entrance       Home Layout: One level Home Equipment: Wheelchair - Publishing copy (2 wheels);Other (comment);Toilet riser;Cane - single point;Wheelchair - power;Hospital bed;Shower seat Additional Comments: PCA assist 3x/wk for 3 hours    Prior Function Prior Level of Function : Needs assist             Mobility Comments: can walk short distances with RW, mostly uses w/c ADLs Comments: aide 15 hours/week, comes every other day. Aide helps prepare breakfast, clean up. Pt indep with in/out caths, sometimes has to stimulate BMs, otherwise does own adls at w/c level     Hand Dominance   Dominant Hand: Right    Extremity/Trunk Assessment   Upper Extremity Assessment Upper Extremity Assessment: Defer to OT evaluation    Lower Extremity Assessment Lower Extremity Assessment: Generalized weakness (history of incomplete SCI, impaired sensation perineum and LEs)    Cervical / Trunk Assessment Cervical / Trunk Assessment: Back Surgery  Communication   Communication: No difficulties  Cognition Arousal/Alertness: Awake/alert Behavior During Therapy: WFL for tasks assessed/performed Overall Cognitive Status: Within Functional Limits for tasks assessed                                 General Comments: Pt was off with stating the year as 2024 and  occasionally conversation was confusing in regards to places he had recently been. Pt answered 911 safety questions and date accurately later in session.        General Comments General comments (skin integrity, edema, etc.): Pt limited by leg weakness and significant wound issues.  Feel pt will need SNF level of care to heal before wife can handle this at home even with a caregiver.    Exercises     Assessment/Plan    PT Assessment Patient needs continued PT services  PT Problem List Decreased strength;Decreased mobility;Decreased activity tolerance;Decreased balance;Decreased knowledge of use of DME;Pain       PT Treatment Interventions DME instruction;Therapeutic activities;Therapeutic exercise;Gait training;Patient/family education;Balance training;Functional mobility training;Neuromuscular re-education    PT Goals (Current goals can be found in the Care Plan section)  Acute Rehab PT Goals PT Goal Formulation: With patient Time For Goal Achievement: 01/24/22 Potential to Achieve Goals: Good    Frequency Min 3X/week     Co-evaluation PT/OT/SLP Co-Evaluation/Treatment: Yes Reason for Co-Treatment: For patient/therapist safety;To address functional/ADL transfers PT goals addressed during session: Mobility/safety with mobility;Balance OT goals addressed during session: ADL's and self-care       AM-PAC PT "6 Clicks" Mobility  Outcome Measure Help needed turning from your back to your side while in a flat bed without using bedrails?: A Little Help needed  moving from lying on your back to sitting on the side of a flat bed without using bedrails?: A Lot Help needed moving to and from a bed to a chair (including a wheelchair)?: A Lot Help needed standing up from a chair using your arms (e.g., wheelchair or bedside chair)?: A Lot Help needed to walk in hospital room?: Total Help needed climbing 3-5 steps with a railing? : Total 6 Click Score: 11    End of Session   Activity  Tolerance: Patient limited by fatigue Patient left: in chair;with chair alarm set;with call bell/phone within reach;with nursing/sitter in room Nurse Communication: Mobility status PT Visit Diagnosis: Other abnormalities of gait and mobility (R26.89);Muscle weakness (generalized) (M62.81)    Time: 3474-2595; 6387-5643  PT Time Calculation (min) (ACUTE ONLY): 37 min   Charges:   PT Evaluation $PT Eval Low Complexity: 1 Low          Lashaye Fisk S, PT DPT Acute Rehabilitation Services Pager [phone]  Office [phone]   Disaya Walt E Ruffin Pyo 01/10/2022, 12:12 PM

## 2022-01-11 DIAGNOSIS — N493 Fournier gangrene: Secondary | ICD-10-CM | POA: Diagnosis not present

## 2022-01-11 LAB — GLUCOSE, CAPILLARY
Glucose-Capillary: 167 mg/dL — ABNORMAL HIGH (ref 70–99)
Glucose-Capillary: 157 mg/dL — ABNORMAL HIGH (ref 70–99)
Glucose-Capillary: 143 mg/dL — ABNORMAL HIGH (ref 70–99)
Glucose-Capillary: 107 mg/dL — ABNORMAL HIGH (ref 70–99)
Glucose-Capillary: 174 mg/dL — ABNORMAL HIGH (ref 70–99)

## 2022-01-11 MED ORDER — SIMVASTATIN 20 MG PO TABS
20.0000 mg | ORAL_TABLET | Freq: Every day | ORAL | Status: DC
  Administered 2022-01-11 – 2022-01-23 (×11): 20 mg via ORAL
  Filled 2022-01-11 (×11): qty 1

## 2022-01-11 NOTE — Progress Notes (Signed)
       RE:   Timothy Davenport    Date of Birth:  May 01, 1950    Date:   01/11/2022     To Whom It May Concern:  Please be advised that the above-named patient will require a short-term nursing home stay - anticipated 30 days or less for rehabilitation and strengthening.  The plan is for return home.

## 2022-01-11 NOTE — Progress Notes (Signed)
Patient refuse wound dressing change at this time , patient requested that it be done in the morning 11/24. Patient educated on the importance of dressing change at this time but still insist that it be done 11/24. Patient wife at bedside during this time.

## 2022-01-11 NOTE — NC FL2 (Signed)
[place] LEVEL OF CARE SCREENING TOOL     IDENTIFICATION  Patient Name: Timothy Davenport Birthdate: May 31, 1950 Sex: male Admission Date (Current Location): 01/05/2022  [facility] and [place] Number:  Herbalist and Address:  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   Malnutrition of moderate degree 04/25/2021   Liver lesion    Urinary tract infection with hematuria    Lower GI bleed 04/24/2021   Anemia 04/24/2021   Essential hypertension 04/24/2021   Diabetes mellitus type 2 in nonobese ([place]) 04/24/2021   Acute GI bleeding 04/24/2021   Erectile dysfunction due to diseases classified elsewhere 03/02/2020   Wheelchair dependence 11/27/2019   Spasticity 08/03/2019   Paraplegia ([place]) 07/07/2019   Neurogenic bowel 07/07/2019   Neurogenic bladder 07/07/2019   Thoracic myelopathy 06/30/2019    Orientation RESPIRATION BLADDER Height & Weight     Self, Time, Situation, Place  Normal Indwelling catheter, Continent Weight: 169 lb 15.6 oz (77.1 kg) Height:  '5\' 11"'$  (180.3 cm)  BEHAVIORAL SYMPTOMS/MOOD NEUROLOGICAL BOWEL NUTRITION STATUS      Incontinent Diet (see d/c summary)  AMBULATORY STATUS COMMUNICATION OF NEEDS Skin   Extensive Assist Verbally Normal                       Personal Care Assistance Level of Assistance  Bathing, Dressing, Feeding Bathing Assistance: Maximum assistance Feeding assistance: Independent Dressing Assistance: Maximum  assistance     Functional Limitations Info  Sight, Hearing, Speech Sight Info: Adequate Hearing Info: Adequate Speech Info: Adequate    SPECIAL CARE FACTORS FREQUENCY  OT (By licensed OT), PT (By licensed PT)     PT Frequency: 5 days/week OT Frequency: 5 days/week            Contractures Contractures Info: Not present    Additional Factors Info  Code Status, Allergies Code Status Info: Full code Allergies Info: Jardiance (empagliflozin), lisinopril, Enalapril-hctz (enalapril-hydrochlorothiazide)   Insulin Sliding Scale Info: (P) insulin aspart (novoLOG) injection 0-7 Units       Current Medications (01/11/2022):  This is the current hospital active medication list Current Facility-Administered Medications  Medication Dose Route Frequency Provider Last Rate Last Admin   acetaminophen (TYLENOL) tablet 1,000 mg  1,000 mg Oral Q6H PRN Winferd Humphrey, PA-C       ceFAZolin (ANCEF) IVPB 2g/100 mL premix  2 g Intravenous Q8H [name], DO       Chlorhexidine Gluconate Cloth 2 % PADS 6 each  6 each Topical Daily Angelica Pou, MD   6 each at 01/11/22 0955   dantrolene (DANTRIUM) capsule 25 mg  25 mg Oral TID [name], DO   25 mg at 01/11/22 0954   enoxaparin (LOVENOX) injection 40 mg  40 mg Subcutaneous Daily Winferd Humphrey, PA-C   40 mg at 01/11/22 0953   feeding supplement (ENSURE ENLIVE / ENSURE PLUS) liquid 237 mL  237 mL Oral BID BM Erroll Luna, MD  237 mL at 01/10/22 1314   HYDROmorphone (DILAUDID) injection 0.5 mg  0.5 mg Intravenous Q4H PRN Winferd Humphrey, PA-C   0.5 mg at 01/11/22 0459   insulin aspart (novoLOG) injection 0-9 Units  0-9 Units Subcutaneous TID WC Cornett, Marcello Moores, MD   2 Units at 01/11/22 9774   lactated ringers bolus 1,000 mL  1,000 mL Intravenous Once Cornett, Thomas, MD       And   lactated ringers bolus 500 mL  500 mL Intravenous Once Cornett, Thomas, MD       multivitamin with minerals tablet 1 tablet  1 tablet Oral  Daily Cornett, Thomas, MD   1 tablet at 01/11/22 0954   oxybutynin (DITROPAN) tablet 7.5 mg  7.5 mg Oral BID Cornett, Marcello Moores, MD   7.5 mg at 01/11/22 1423   oxyCODONE (Oxy IR/ROXICODONE) immediate release tablet 5-10 mg  5-10 mg Oral Q4H PRN Richard Miu H, PA-C       polyethylene glycol (MIRALAX / GLYCOLAX) packet 17 g  17 g Oral Daily Cornett, Marcello Moores, MD   17 g at 01/11/22 0954   sertraline (ZOLOFT) tablet 100 mg  100 mg Oral Daily Cornett, Marcello Moores, MD   100 mg at 01/11/22 0954   simvastatin (ZOCOR) tablet 20 mg  20 mg Oral q1800 [name], DO         Discharge Medications: Please see discharge summary for a list of discharge medications.  Relevant Imaging Results:  Relevant Lab Results:   Additional Information SSN [place], [place]

## 2022-01-11 NOTE — Progress Notes (Addendum)
HD#6 Subjective:   Summary: Timothy Davenport 71 y.o. male with PMH of T2DM on insulin, paraplegia T8 spinal cord injury, neurogenic bladder, CKD 3, HLD, prior CVA who presents for worsening left scrotal infection, admitted for fournier gangrene.     Overnight Events: none  Timothy Davenport denies any pain or new complaints. He states improving appetite and his wife is bringing him food today.   Objective:  Vital signs in last 24 hours: Vitals:   01/10/22 1505 01/10/22 1923 01/11/22 0515 01/11/22 0853  BP: 120/82 130/87 132/81 120/80  Pulse: 79 96 88 85  Resp: '16 17 17 17  '$ Temp: 98.3 F (36.8 C) 99.1 F (37.3 C) 98.4 F (36.9 C) 98.7 F (37.1 C)  TempSrc: Oral Oral Oral Oral  SpO2: 100% 100% 100% 100%  Weight:      Height:        Physical Exam:  Constitutional: appears resting comfortably in bed, in no acute distress HENT: normocephalic atraumatic Pulmonary/Chest: normal work of breathing on room air MSK: normal bulk and tone Neurological: alert, no focal deficits Skin: warm and dry Psych: normal mood and behavior  Filed Weights   01/05/22 0835 01/05/22 1145 01/09/22 0953  Weight: 81.6 kg 77.1 kg 77.1 kg     Intake/Output Summary (Last 24 hours) at 01/11/2022 1108 Last data filed at 01/11/2022 1000 Gross per 24 hour  Intake 477 ml  Output 350 ml  Net 127 ml   Net IO Since Admission: 2,234.06 mL [01/11/22 1108]  Pertinent Labs:    Latest Ref Rng & Units 01/10/2022    3:55 AM 01/09/2022    2:34 AM 01/08/2022    3:33 AM  CBC  WBC 4.0 - 10.5 K/uL 9.8  9.8  13.2   Hemoglobin 13.0 - 17.0 g/dL 8.9  8.5  8.8   Hematocrit 39.0 - 52.0 % 27.4  26.6  26.5   Platelets 150 - 400 K/uL 191  178  162        Latest Ref Rng & Units 01/10/2022    3:55 AM 01/08/2022    3:33 AM 01/07/2022   12:28 PM  CMP  Glucose 70 - 99 mg/dL 115  150  153   BUN 8 - 23 mg/dL '13  18  22   '$ Creatinine 0.61 - 1.24 mg/dL 0.90  1.12  1.17   Sodium 135 - 145 mmol/L 141  140  143   Potassium  3.5 - 5.1 mmol/L 3.6  3.7  4.6   Chloride 98 - 111 mmol/L 103  108  109   CO2 22 - 32 mmol/L '29  23  21   '$ Calcium 8.9 - 10.3 mg/dL 9.7  9.8  9.7     Imaging: No results found.  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 with a pertinent PMH of of T2DM on insulin, paraplegia T8 spinal cord injury, neurogenic bladder, CKD 3, HLD, prior CVA who presents for worsening left scrotal infection, admitted for fournier gangrene.     Sepsis secondary to fournier gangrene, wound culture positive for Citrobacter koseri  Right seminal vesicle abscess S/p 3rd I&D on 11/21. NG on blood culture. Plan to repeat CT tomorrow to examine if fluid collection persists around right seminal vesicle, if so possible IR drainage. Further  narrowed antibiotic coverage. Patient will likely need prolonged antibiotic therapy for this infection. Plan to consult ID tomorrow. General surgery noted plastic surgery consult about wound closure, plan to consult them early next week.  -appreciate urology and surgery recommendations -abx day 7, start cephazolin 2g q8h (day 1) -repeat CT A/P tomorrow, if fluid collection persists then possible IR drainage  -Ensure feeding supplement BID -continue wound care -plan to consult ID and plastic surgery   Hypophosphatemia  Phosphorus was 1.9. Potassium was 3.6. Given potassium phosphate on 11/22. -recheck phosphorus level  T2DM on insulin CBG has been well controlled. Still decreased appetite but slowly improving. Continue to encourage increasing PO intake.  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 He is on dantrolene for spasms and oxybutynin.  -dantrolene 25 mg TID -oxybutynin 7.5 mg BID  HTN Has been normotensive.  PTA on losartan. Will continue holding losartan since normotensive.   IDA Hgb has been steady. Consider restarting iron supplement after CT scan tomorrow.   Hx of CVA PTA on simvastatin and aspirin.  -restart home simvastatin   Diet: Normal IVF: None,None VTE: Enoxaparin Code: Full PT/OT recs: SNF Family Update: talked to wife yesterday over phone about updates and SNF placement   Dispo: Anticipated discharge to Skilled nursing facility pending medical stability.   [name], DO Internal Medicine Resident PGY-1 Pager: [phone] Please contact the on call pager after 5 pm and on weekends at [phone].

## 2022-01-11 NOTE — Progress Notes (Signed)
Patient ID: Timothy Davenport, male   DOB: [birth year], 71 y.o.   MRN: [id number] [facility] Surgery Progress Note:   2 Days Post-Op   THE PLAN  Repeat CT to examine ? Seminal vesical abscess tomorrow.   Continue bedside wet/dry dressings to lower abdomen and scrotal regions  Subjective: Mental status is alert.  Complaints few complaints. Objective: Vital signs in last 24 hours: Temp:  [98.3 F (36.8 C)-99.1 F (37.3 C)] 98.4 F (36.9 C) (11/23 0515) Pulse Rate:  [67-96] 88 (11/23 0515) Resp:  [16-17] 17 (11/23 0515) BP: (120-140)/(81-87) 132/81 (11/23 0515) SpO2:  [100 %] 100 % (11/23 0515)  Intake/Output from previous day: 11/22 0701 - 11/23 0700 In: 360 [P.O.:360] Out: 950 [Urine:950] Intake/Output this shift: No intake/output data recorded.  Physical Exam: Work of breathing is normal;  mesh panties holding kerlex in place (changed an hour ago)  Lab Results:  Results for orders placed or performed during the hospital encounter of 01/05/22 (from the past 48 hour(s))  Glucose, capillary     Status: None   Collection Time: 01/09/22 10:44 AM  Result Value Ref Range   Glucose-Capillary 98 70 - 99 mg/dL    Comment: Glucose reference range applies only to samples taken after fasting for at least 8 hours.  Glucose, capillary     Status: Abnormal   Collection Time: 01/09/22 11:49 AM  Result Value Ref Range   Glucose-Capillary 110 (H) 70 - 99 mg/dL    Comment: Glucose reference range applies only to samples taken after fasting for at least 8 hours.  Glucose, capillary     Status: Abnormal   Collection Time: 01/09/22  5:26 PM  Result Value Ref Range   Glucose-Capillary 114 (H) 70 - 99 mg/dL    Comment: Glucose reference range applies only to samples taken after fasting for at least 8 hours.  Glucose, capillary     Status: Abnormal   Collection Time: 01/09/22  9:42 PM  Result Value Ref Range   Glucose-Capillary 152 (H) 70 - 99 mg/dL    Comment: Glucose reference range  applies only to samples taken after fasting for at least 8 hours.  Basic metabolic panel     Status: Abnormal   Collection Time: 01/10/22  3:55 AM  Result Value Ref Range   Sodium 141 135 - 145 mmol/L   Potassium 3.6 3.5 - 5.1 mmol/L   Chloride 103 98 - 111 mmol/L   CO2 29 22 - 32 mmol/L   Glucose, Bld 115 (H) 70 - 99 mg/dL    Comment: Glucose reference range applies only to samples taken after fasting for at least 8 hours.   BUN 13 8 - 23 mg/dL   Creatinine, Ser 0.90 0.61 - 1.24 mg/dL   Calcium 9.7 8.9 - 10.3 mg/dL   GFR, Estimated >60 >60 mL/min    Comment: (NOTE) Calculated using the CKD-EPI Creatinine Equation (2021)    Anion gap 9 5 - 15    Comment: Performed at [facility] [address], [place] [address]  CBC     Status: Abnormal   Collection Time: 01/10/22  3:55 AM  Result Value Ref Range   WBC 9.8 4.0 - 10.5 K/uL   RBC 3.52 (L) 4.22 - 5.81 MIL/uL   Hemoglobin 8.9 (L) 13.0 - 17.0 g/dL    Comment: Reticulocyte Hemoglobin testing may be clinically indicated, consider ordering this additional test [id number]    HCT 27.4 (L) 39.0 - 52.0 %  MCV 77.8 (L) 80.0 - 100.0 fL   MCH 25.3 (L) 26.0 - 34.0 pg   MCHC 32.5 30.0 - 36.0 g/dL   RDW 16.3 (H) 11.5 - 15.5 %   Platelets 191 150 - 400 K/uL   nRBC 0.0 0.0 - 0.2 %    Comment: Performed at [facility] [address] [place], [place] [address]  Phosphorus     Status: Abnormal   Collection Time: 01/10/22  6:33 AM  Result Value Ref Range   Phosphorus 1.9 (L) 2.5 - 4.6 mg/dL    Comment: Performed at [facility] [address] [place], [place] [address]  Magnesium     Status: None   Collection Time: 01/10/22  6:33 AM  Result Value Ref Range   Magnesium 2.0 1.7 - 2.4 mg/dL    Comment: Performed at [facility] [address], [place],  [address]  Glucose, capillary     Status: Abnormal   Collection Time: 01/10/22  8:59 AM  Result Value Ref Range   Glucose-Capillary 115 (H) 70 -  99 mg/dL    Comment: Glucose reference range applies only to samples taken after fasting for at least 8 hours.  Glucose, capillary     Status: Abnormal   Collection Time: 01/10/22 11:43 AM  Result Value Ref Range   Glucose-Capillary 139 (H) 70 - 99 mg/dL    Comment: Glucose reference range applies only to samples taken after fasting for at least 8 hours.   Comment 1 Notify RN   Glucose, capillary     Status: Abnormal   Collection Time: 01/10/22  5:02 PM  Result Value Ref Range   Glucose-Capillary 132 (H) 70 - 99 mg/dL    Comment: Glucose reference range applies only to samples taken after fasting for at least 8 hours.   Comment 1 Notify RN   Glucose, capillary     Status: Abnormal   Collection Time: 01/10/22  7:25 PM  Result Value Ref Range   Glucose-Capillary 138 (H) 70 - 99 mg/dL    Comment: Glucose reference range applies only to samples taken after fasting for at least 8 hours.    Radiology/Results: No results found.  Anti-infectives: Anti-infectives (From admission, onward)    Start     Dose/Rate Route Frequency Ordered Stop   01/11/22 1400  ceFAZolin (ANCEF) IVPB 2g/100 mL premix        2 g 200 mL/hr over 30 Minutes Intravenous Every 8 hours 01/10/22 1330     01/08/22 1400  cefTRIAXone (ROCEPHIN) 2 g in sodium chloride 0.9 % 100 mL IVPB  Status:  Discontinued        2 g 200 mL/hr over 30 Minutes Intravenous Every 24 hours 01/08/22 1250 01/10/22 1330   01/07/22 1500  piperacillin-tazobactam (ZOSYN) IVPB 3.375 g  Status:  Discontinued        3.375 g 12.5 mL/hr over 240 Minutes Intravenous Every 8 hours 01/07/22 1357 01/08/22 1244   01/06/22 1100  vancomycin (VANCOCIN) IVPB 1000 mg/200 mL premix  Status:  Discontinued        1,000 mg 200 mL/hr over 60 Minutes Intravenous Every 24 hours 01/05/22 0946 01/05/22 0949   01/06/22 0000  vancomycin variable dose per unstable renal function (pharmacist dosing)  Status:  Discontinued         Does not apply See admin instructions  01/05/22 0949 01/05/22 1514   01/05/22 2200  ceFEPIme (MAXIPIME) 2 g in sodium chloride 0.9 %  100 mL IVPB  Status:  Discontinued        2 g 200 mL/hr over 30 Minutes Intravenous Every 12 hours 01/05/22 0946 01/05/22 1514   01/05/22 2000  piperacillin-tazobactam (ZOSYN) IVPB 3.375 g  Status:  Discontinued        3.375 g 12.5 mL/hr over 240 Minutes Intravenous Every 8 hours 01/05/22 1711 01/07/22 1250   01/05/22 1515  linezolid (ZYVOX) IVPB 600 mg  Status:  Discontinued        600 mg 300 mL/hr over 60 Minutes Intravenous Every 12 hours 01/05/22 1514 01/08/22 1244   01/05/22 0945  ceFEPIme (MAXIPIME) 2 g in sodium chloride 0.9 % 100 mL IVPB        2 g 200 mL/hr over 30 Minutes Intravenous  Once 01/05/22 0932 01/05/22 1038   01/05/22 0945  metroNIDAZOLE (FLAGYL) IVPB 500 mg        500 mg 100 mL/hr over 60 Minutes Intravenous  Once 01/05/22 0932 01/05/22 1103   01/05/22 0945  vancomycin (VANCOCIN) IVPB 1000 mg/200 mL premix  Status:  Discontinued        1,000 mg 200 mL/hr over 60 Minutes Intravenous  Once 01/05/22 0932 01/05/22 0944   01/05/22 0945  vancomycin (VANCOREADY) IVPB 2000 mg/400 mL        2,000 mg 200 mL/hr over 120 Minutes Intravenous  Once 01/05/22 0944 01/05/22 1256       Assessment/Plan: Problem List: Patient Active Problem List   Diagnosis Date Noted   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   Plan as above.   2 Days Post-Op    LOS: 6 days   Matt B. Hassell Done, MD, [facility] [phone] to reach the surgeon on call.    01/11/2022 8:52 AM

## 2022-01-11 NOTE — Progress Notes (Signed)
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 Dressing change 11/22 all edges viable. CX pan-sensitive Citrobacter placed on Ancef.  2 - Possible Pelvic / Rt Seminal Vesical Abscess - abtou 4cm fluie colledtion in very deep pelvix near Rt SV tip.Repeat CT pending for 11/24.   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  Today "Timothy Davenport" is stable. NO high grade fevers or leukocytosis. Repeat pelvic CT ordered for tomorrow. Remains on CX-specific ancef.   Objective: Vital signs in last 24 hours: Temp:  [98.3 F (36.8 C)-99.1 F (37.3 C)] 98.4 F (36.9 C) (11/23 0515) Pulse Rate:  [67-96] 88 (11/23 0515) Resp:  [16-17] 17 (11/23 0515) BP: (120-140)/(81-87) 132/81 (11/23 0515) SpO2:  [100 %] 100 % (11/23 0515) Last BM Date : 01/10/22  Intake/Output from previous day: 11/22 0701 - 11/23 0700 In: 360 [P.O.:360] Out: 950 [Urine:950] Intake/Output this shift: No intake/output data recorded.  NAD, AOx3 RRR Minimal abd tenderness Large, complex perineal /abdomina / penile wound with wet to dry dressing in place. All edges clean/viable.   Lab Results:  Recent Labs    01/09/22 0234 01/10/22 0355  WBC 9.8 9.8  HGB 8.5* 8.9*  HCT 26.6* 27.4*  PLT 178 191   BMET Recent Labs    01/10/22 0355  NA 141  K 3.6  CL 103  CO2 29  GLUCOSE 115*  BUN 13  CREATININE 0.90  CALCIUM 9.7   PT/INR No results for input(s): "LABPROT", "INR" in the last 72 hours. ABG No results for input(s): "PHART", "HCO3" in the last 72 hours.  Invalid input(s): "PCO2", "PO2"  Studies/Results: No results found.  Anti-infectives: Anti-infectives (From admission, onward)    Start     Dose/Rate Route Frequency Ordered Stop   01/11/22 1400  ceFAZolin (ANCEF) IVPB 2g/100 mL premix        2 g 200 mL/hr over 30 Minutes Intravenous Every 8 hours  01/10/22 1330     01/08/22 1400  cefTRIAXone (ROCEPHIN) 2 g in sodium chloride 0.9 % 100 mL IVPB  Status:  Discontinued        2 g 200 mL/hr over 30 Minutes Intravenous Every 24 hours 01/08/22 1250 01/10/22 1330   01/07/22 1500  piperacillin-tazobactam (ZOSYN) IVPB 3.375 g  Status:  Discontinued        3.375 g 12.5 mL/hr over 240 Minutes Intravenous Every 8 hours 01/07/22 1357 01/08/22 1244   01/06/22 1100  vancomycin (VANCOCIN) IVPB 1000 mg/200 mL premix  Status:  Discontinued        1,000 mg 200 mL/hr over 60 Minutes Intravenous Every 24 hours 01/05/22 0946 01/05/22 0949   01/06/22 0000  vancomycin variable dose per unstable renal function (pharmacist dosing)  Status:  Discontinued         Does not apply See admin instructions 01/05/22 0949 01/05/22 1514   01/05/22 2200  ceFEPIme (MAXIPIME) 2 g in sodium chloride 0.9 % 100 mL IVPB  Status:  Discontinued        2 g 200 mL/hr over 30 Minutes Intravenous Every 12 hours 01/05/22 0946 01/05/22 1514   01/05/22 2000  piperacillin-tazobactam (ZOSYN) IVPB 3.375 g  Status:  Discontinued        3.375 g 12.5 mL/hr over 240 Minutes Intravenous Every 8 hours 01/05/22 1711 01/07/22 1250   01/05/22 1515  linezolid (ZYVOX) IVPB 600 mg  Status:  Discontinued        600 mg 300 mL/hr over 60 Minutes Intravenous Every 12 hours 01/05/22 1514 01/08/22 1244   01/05/22 0945  ceFEPIme (MAXIPIME) 2 g in sodium chloride 0.9 % 100 mL IVPB        2 g 200 mL/hr over 30 Minutes Intravenous  Once 01/05/22 0932 01/05/22 1038   01/05/22 0945  metroNIDAZOLE (FLAGYL) IVPB 500 mg        500 mg 100 mL/hr over 60 Minutes Intravenous  Once 01/05/22 0932 01/05/22 1103   01/05/22 0945  vancomycin (VANCOCIN) IVPB 1000 mg/200 mL premix  Status:  Discontinued        1,000 mg 200 mL/hr over 60 Minutes Intravenous  Once 01/05/22 0932 01/05/22 0944   01/05/22 0945  vancomycin (VANCOREADY) IVPB 2000 mg/400 mL        2,000 mg 200 mL/hr over 120 Minutes Intravenous  Once 01/05/22  0944 01/05/22 1256       Assessment/Plan:  No indiciton for further GU debridement at this time. Agree with repeat pelvic imagign tomorrow, could consider perc-drain if fluid collection expanding / worse or clinical condition worsens.    Alexis Frock 01/11/2022

## 2022-01-12 ENCOUNTER — Inpatient Hospital Stay (HOSPITAL_COMMUNITY): Payer: Medicare Other

## 2022-01-12 DIAGNOSIS — N493 Fournier gangrene: Secondary | ICD-10-CM | POA: Diagnosis not present

## 2022-01-12 LAB — BASIC METABOLIC PANEL
Anion gap: 15 (ref 5–15)
BUN: 10 mg/dL (ref 8–23)
CO2: 24 mmol/L (ref 22–32)
Calcium: 9.4 mg/dL (ref 8.9–10.3)
Chloride: 103 mmol/L (ref 98–111)
Creatinine, Ser: 0.91 mg/dL (ref 0.61–1.24)
GFR, Estimated: >60 mL/min (ref >60)
Glucose, Bld: 140 mg/dL — ABNORMAL HIGH (ref 70–99)
Potassium: 2.9 mmol/L — ABNORMAL LOW (ref 3.5–5.1)
Sodium: 142 mmol/L (ref 135–145)

## 2022-01-12 LAB — PHOSPHORUS: Phosphorus: 1.8 mg/dL — ABNORMAL LOW (ref 2.5–4.6)

## 2022-01-12 LAB — GLUCOSE, CAPILLARY
Glucose-Capillary: 243 mg/dL — ABNORMAL HIGH (ref 70–99)
Glucose-Capillary: 171 mg/dL — ABNORMAL HIGH (ref 70–99)
Glucose-Capillary: 221 mg/dL — ABNORMAL HIGH (ref 70–99)
Glucose-Capillary: 160 mg/dL — ABNORMAL HIGH (ref 70–99)

## 2022-01-12 LAB — MAGNESIUM: Magnesium: 2 mg/dL (ref 1.7–2.4)

## 2022-01-12 MED ORDER — POTASSIUM PHOSPHATES 15 MMOLE/5ML IV SOLN
30.0000 mmol | Freq: Once | INTRAVENOUS | Status: AC
  Administered 2022-01-12: 30 mmol via INTRAVENOUS
  Filled 2022-01-12: qty 10

## 2022-01-12 MED ORDER — DIPHENHYDRAMINE HCL 50 MG/ML IJ SOLN
INTRAMUSCULAR | Status: AC | PRN
  Administered 2022-01-12 (×2): 25 mg via INTRAVENOUS

## 2022-01-12 MED ORDER — LIDOCAINE HCL 1 % IJ SOLN
10.0000 mL | Freq: Once | INTRAMUSCULAR | Status: DC
  Filled 2022-01-12: qty 10

## 2022-01-12 MED ORDER — IOHEXOL 350 MG/ML SOLN
75.0000 mL | Freq: Once | INTRAVENOUS | Status: AC | PRN
  Administered 2022-01-12: 75 mL via INTRAVENOUS

## 2022-01-12 MED ORDER — PIPERACILLIN-TAZOBACTAM 3.375 G IVPB
3.3750 g | Freq: Three times a day (TID) | INTRAVENOUS | Status: DC
  Administered 2022-01-12 – 2022-01-15 (×9): 3.375 g via INTRAVENOUS
  Filled 2022-01-12 (×10): qty 50

## 2022-01-12 MED ORDER — FENTANYL CITRATE (PF) 100 MCG/2ML IJ SOLN
INTRAMUSCULAR | Status: AC | PRN
  Administered 2022-01-12: 25 ug via INTRAVENOUS
  Administered 2022-01-12: 50 ug via INTRAVENOUS
  Administered 2022-01-12: 25 ug via INTRAVENOUS

## 2022-01-12 MED ORDER — LIDOCAINE HCL 1 % IJ SOLN
INTRAMUSCULAR | Status: AC
  Filled 2022-01-12: qty 10

## 2022-01-12 MED ORDER — DIPHENHYDRAMINE HCL 50 MG/ML IJ SOLN
INTRAMUSCULAR | Status: AC
  Filled 2022-01-12: qty 1

## 2022-01-12 MED ORDER — SODIUM CHLORIDE 0.9% FLUSH
5.0000 mL | Freq: Three times a day (TID) | INTRAVENOUS | Status: DC
  Administered 2022-01-12 – 2022-01-24 (×33): 5 mL

## 2022-01-12 MED ORDER — FENTANYL CITRATE (PF) 100 MCG/2ML IJ SOLN
INTRAMUSCULAR | Status: AC
  Filled 2022-01-12: qty 2

## 2022-01-12 NOTE — Consult Note (Signed)
Chief Complaint: Patient was seen in consultation today for pelvic abscess  Referring Physician(s): Dr Axel Filler  Supervising Physician: Markus Daft  Patient Status: [facility] - In-pt  History of Present Illness: Timothy Davenport is a 71 y.o. male with PMH significant for diabetes mellitus, Fournier gangrene, and hypertension being seen today for a pelvic abscess. The patient presented to [facility] ED on 11/15 with scrotal pain and confusion. He was diagnosed with UTI and balanitis and discharged on cefadroxil, diflucan, and topical antifungal. Patient presented back to [facility] ED on 11/17 with a worsening of scrotal swelling and pain, as well as foul smelling discharge. CT revealed an abscess and gas from the scrotum to the lower anterior pelvic wall. Patient was subsequently diagnosed with Fournier gangrene and underwent surgical debridement on 11/17 and 11/19. Patient underwent CT Pelvis w contrast on morning of 11/24, which revealed an enlarging abscess adjacent to the sigmoid colon and right seminal vesicle. IR was subsequently contacted to evaluate the patient for possible aspiration and drain placement.  Past Medical History:  Diagnosis Date   Colon polyp    Diabetes mellitus without complication ([place])    Diabetic neuropathy ([place])    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   HTN (hypertension)    Patella fracture    Renal calculi     Past Surgical History:  Procedure Laterality Date   I & D EXTREMITY N/A 01/09/2022   Procedure: EXCISIONAL DEBRIDEMENT BILATERAL GROIN AND LOWER ABDOMEN;  Surgeon: Erroll Luna, MD;  Location: [place];  Service: General;  Laterality: N/A;   IRRIGATION AND DEBRIDEMENT ABSCESS N/A 01/05/2022   Procedure: IRRIGATION AND DEBRIDEMENT NECROTIZING  TISSUE RECTAL ABSCESS;  Surgeon: Vira Agar, MD;  Location: [place];  Service: Urology;  Laterality: N/A;   LUMBAR LAMINECTOMY/DECOMPRESSION MICRODISCECTOMY N/A 07/01/2019   Procedure: LUMBAR  LAMINECTOMY/DECOMPRESSION MICRODISCECTOMY 1 LEVEL, THORACIC SIX-SEVEN;  Surgeon: Consuella Lose, MD;  Location: [place];  Service: Neurosurgery;  Laterality: N/A;  LUMBAR LAMINECTOMY/DECOMPRESSION MICRODISCECTOMY 1 LEVEL, THORACIC SIX-SEVEN    ORIF PATELLA     SCROTAL EXPLORATION N/A 01/07/2022   Procedure: DEBRIDEMENT OF PENIS, SCROTUM, TESTICLES, AND GROIN;  Surgeon: Vira Agar, MD;  Location: [place];  Service: Urology;  Laterality: N/A;   THORACIC DISCECTOMY N/A 07/03/2019   Procedure: Evacuation of Thoracic Hematoma;  Surgeon: Consuella Lose, MD;  Location: [employer];  Service: Neurosurgery;  Laterality: N/A;   WOUND DEBRIDEMENT N/A 01/07/2022   Procedure: DEBRIDEMENT OF GROIN AND ABDOMINAL WALL;  Surgeon: Dwan Bolt, MD;  Location: [place];  Service: General;  Laterality: N/A;    Allergies: Jardiance [empagliflozin], Lisinopril, and Enalapril-hctz [enalapril-hydrochlorothiazide]  Medications: Prior to Admission medications   Medication Sig Start Date End Date Taking? Authorizing Provider  Alogliptin Benzoate 25 MG TABS Take 12.5 tablets by mouth daily with breakfast.   Yes [provider]  Ascorbic Acid 500 MG CAPS Take 500 mg by mouth every Monday, Wednesday, and Friday. 05/03/21  Yes [provider]  aspirin EC 81 MG tablet Take 1 tablet (81 mg total) by mouth daily. Swallow whole. 04/30/21  Yes Elgergawy, Silver Huguenin, MD  baclofen (LIORESAL) 20 MG tablet Take 20 mg by mouth every 8 (eight) hours. 01/11/20  Yes [provider]  cefadroxil (DURICEF) 500 MG capsule Take 1 capsule (500 mg total) by mouth 2 (two) times daily. 01/03/22  Yes Prosperi, Christian H, PA-C  Cholecalciferol 100 MCG (4000 UT) TABS Take 4,000 Units by mouth daily. 01/10/21  Yes [provider]  dantrolene (DANTRIUM) 25 MG capsule Take 75 mg by mouth 2 (two) times daily.   Yes [provider]  docusate sodium (COLACE) 50 MG capsule Take 50 mg by mouth daily.   Yes  [provider]  empagliflozin (JARDIANCE) 25 MG TABS tablet Take 12.5 mg by mouth daily. 10/20/20  Yes [provider]  ferrous gluconate (FERGON) 324 MG tablet Take 324 mg by mouth every Monday, Wednesday, and Friday. 05/03/21  Yes [provider]  folic acid (FOLVITE) 1 MG tablet Take 1 mg by mouth daily. 12/06/21  Yes [provider]  gabapentin (NEURONTIN) 400 MG capsule Take 800 mg by mouth 3 (three) times daily. 01/28/20  Yes [provider]  glucose 4 GM chewable tablet Chew 4 tablets by mouth as needed for low blood sugar.   Yes [provider]  insulin glargine-yfgn (SEMGLEE) 100 UNIT/ML Pen Inject 10 Units into the skin at bedtime. 12/06/21  Yes [provider]  losartan (COZAAR) 50 MG tablet Take 75 mg by mouth daily. 12/06/21  Yes [provider]  omeprazole (PRILOSEC) 20 MG capsule Take 20 mg by mouth daily. 07/31/21  Yes [provider]  oxybutynin (DITROPAN) 5 MG tablet Take 5 mg by mouth 2 (two) times daily.   Yes [provider]  pioglitazone (ACTOS) 30 MG tablet Take 30 mg by mouth daily.   Yes [provider]  sertraline (ZOLOFT) 100 MG tablet Take 100 mg by mouth daily.   Yes [provider]  simvastatin (ZOCOR) 20 MG tablet Take 20 mg by mouth daily at 6 PM.   Yes [provider]  acetaminophen (TYLENOL) 325 MG tablet Take 1-2 tablets (325-650 mg total) by mouth every 4 (four) hours as needed for mild pain. Patient not taking: Reported on 01/06/2022 07/20/19   Love, Ivan Anchors, PA-C  bisacodyl (DULCOLAX) 10 MG suppository UNWRAP AND INSERT 1 SUPPOSITORY(10 MG) RECTALLY DAILY AFTER SUPPER Patient not taking: Reported on 01/06/2022 08/21/19   Raulkar, Clide Deutscher, MD  dantrolene (DANTRIUM) 100 MG capsule Take 1 capsule (100 mg total) by mouth 3 (three) times daily. Patient not taking: Reported on 01/06/2022 03/08/21   Courtney Heys, MD  polyvinyl alcohol (LIQUIFILM TEARS) 1.4  % ophthalmic solution Place 1 drop into both eyes 4 (four) times daily. Patient not taking: Reported on 01/06/2022 07/20/19   Love, Ivan Anchors, PA-C     Family History  Problem Relation Age of Onset   Hypertension Mother    Hypertension Father     Social History   Socioeconomic History   Marital status: Married    Spouse name: Not on file   Number of children: Not on file   Years of education: Not on file   Highest education level: Not on file  Occupational History   Not on file  Tobacco Use   Smoking status: Never   Smokeless tobacco: Never  Vaping Use   Vaping Use: Never used  Substance and Sexual Activity   Alcohol use: Yes    Comment: 2-3 drinks week   Drug use: Yes    Types: Marijuana    Comment: occasionally   Sexual activity: Not Currently  Other Topics Concern   Not on file  Social History Narrative   Not on file   Social Determinants of Health   Financial Resource Strain: Not on file  Food Insecurity: Not on file  Transportation Needs: Not on file  Physical Activity: Not on file  Stress: Not  on file  Social Connections: Not on file     Review of Systems: A 12 point ROS discussed and pertinent positives are indicated in the HPI above.  All other systems are negative.  Review of Systems  Constitutional:  Negative for chills and fever.  Respiratory:  Negative for chest tightness and shortness of breath.   Cardiovascular:  Negative for chest pain and leg swelling.  Gastrointestinal:  Negative for abdominal pain, diarrhea, nausea and vomiting.  Neurological:  Negative for dizziness and headaches.  Psychiatric/Behavioral:  Negative for confusion.     Vital Signs: BP (!) 125/92 (BP Location: Left Arm)   Pulse 75   Temp 98.5 F (36.9 C) (Oral)   Resp 17   Ht '5\' 11"'$  (1.803 m)   Wt 169 lb 15.6 oz (77.1 kg)   SpO2 100%   BMI 23.71 kg/m    Physical Exam Vitals reviewed.  Constitutional:      General: He is not in acute distress.    Appearance: He  is not ill-appearing.  HENT:     Mouth/Throat:     Mouth: Mucous membranes are moist.  Cardiovascular:     Rate and Rhythm: Normal rate and regular rhythm.     Pulses: Normal pulses.     Heart sounds: Normal heart sounds.  Pulmonary:     Effort: Pulmonary effort is normal.     Breath sounds: Normal breath sounds.  Abdominal:     General: Bowel sounds are normal.     Palpations: Abdomen is soft.     Tenderness: There is no abdominal tenderness.  Musculoskeletal:     Right lower leg: No edema.     Left lower leg: No edema.  Skin:    General: Skin is warm and dry.  Neurological:     Mental Status: He is alert and oriented to person, place, and time.  Psychiatric:        Mood and Affect: Mood normal.        Behavior: Behavior normal.     Imaging: CT PELVIS W CONTRAST  Result Date: 01/12/2022 CLINICAL DATA:  Pelvic abscess. EXAM: CT PELVIS WITH CONTRAST TECHNIQUE: Multidetector CT imaging of the pelvis was performed using the standard protocol following the bolus administration of intravenous contrast. RADIATION DOSE REDUCTION: This exam was performed according to the departmental dose-optimization program which includes automated exposure control, adjustment of the mA and/or kV according to patient size and/or use of iterative reconstruction technique. CONTRAST:  46m OMNIPAQUE IOHEXOL 350 MG/ML SOLN COMPARISON:  01/08/2022 FINDINGS: Urinary Tract: Distal ureters are unremarkable. Urinary bladder is decompressed by a Foley catheter. Bowel: Normal appearance of the appendix. Normal appearance of large bowel loops. Normal appearance of small bowel loops. Adjacent to the sigmoid colon and RIGHT seminal vesicle, there is a rim enhancing fluid collection measuring 4.1 x 4.0 centimeters, previously 3.6 x 3.7 centimeters at the same level. No new fluid collections. Vascular/Ly Mphatic: There is dense atherosclerotic calcification of the abdominal aorta and its branches. No retroperitoneal or  mesenteric adenopathy. Reproductive: Prostate gland unremarkable. Abscess collection adjacent to RIGHT seminal vesicle, described above. Other: Large surgical defect involving the LOWER anterior abdominal wall and perineum, LEFT greater than RIGHT. The appearance is similar to prior study. Musculoskeletal: There is a nonspecific 1.1 centimeter low-attenuation lesion within the posterior RIGHT ilium. IMPRESSION: 1. Slight increase in size of abscess collection adjacent to the sigmoid colon and RIGHT seminal vesicle, now measuring 4.1 x 4.0 centimeters, previously 3.6 x 3.7 centimeters.  No new fluid collections. 2. Similar large surgical defect involving the LOWER anterior abdominal wall and perineum. 3. Nonspecific 1.1 centimeter low-attenuation lesion within the posterior RIGHT ilium. 4.  Aortic Atherosclerosis (ICD10-I70.0). Electronically Signed   By: Nolon Nations M.D.   On: 01/12/2022 09:01   CT PELVIS W CONTRAST  Result Date: 01/08/2022 CLINICAL DATA:  Perianal abscess or fistula. EXAM: CT PELVIS WITH CONTRAST TECHNIQUE: Multidetector CT imaging of the pelvis was performed using the standard protocol following the bolus administration of intravenous contrast. RADIATION DOSE REDUCTION: This exam was performed according to the departmental dose-optimization program which includes automated exposure control, adjustment of the mA and/or kV according to patient size and/or use of iterative reconstruction technique. CONTRAST:  16m OMNIPAQUE IOHEXOL 350 MG/ML SOLN COMPARISON:  January 05, 2022. FINDINGS: Urinary Tract: Urinary bladder is decompressed secondary to Foley catheter. Visualized ureters are unremarkable. Bowel: There is no definite evidence of bowel obstruction. The appendix is unremarkable. There is no definite evidence of perianal abscess. Vascular/Lymphatic: Atherosclerosis of visualized abdominal aorta is noted. No definite adenopathy is noted. Reproductive: Prostate gland is unremarkable.  However, there is continued presence of 3.6 x 3.7 cm fluid collection or abscess in the region of the right seminal vesicle. Other: Large defect is seen involving the anterior pelvic subcutaneous tissues as well as in the perineum consistent with recent surgical resection for infection. Musculoskeletal: No significant osseous abnormality is noted. IMPRESSION: Large surgical defect is seen involving the subcutaneous tissues of the anterior pelvis and perineum. Continued presence of 3.7 x 3.6 cm fluid collection or abscess in the region of the right seminal vesicle. There is no definite evidence of perianal abscess currently. Aortic Atherosclerosis (ICD10-I70.0). Electronically Signed   By: JMarijo ConceptionM.D.   On: 01/08/2022 11:47   CT ABDOMEN PELVIS W CONTRAST  Result Date: 01/05/2022 CLINICAL DATA:  Scrotal pain with purulent drainage. Concern for Fournier's gangrene. EXAM: CT ABDOMEN AND PELVIS WITH CONTRAST TECHNIQUE: Multidetector CT imaging of the abdomen and pelvis was performed using the standard protocol following bolus administration of intravenous contrast. RADIATION DOSE REDUCTION: This exam was performed according to the departmental dose-optimization program which includes automated exposure control, adjustment of the mA and/or kV according to patient size and/or use of iterative reconstruction technique. CONTRAST:  765mOMNIPAQUE IOHEXOL 350 MG/ML SOLN COMPARISON:  Scrotal ultrasound 2 days prior, CT abdomen/pelvis 04/24/2021 FINDINGS: Lower chest: The lung bases are clear. Coronary artery calcifications and aortic valve calcifications are noted. Hepatobiliary: Numerous hypodense lesions throughout the liver are similar to the prior study, likely benign cysts for which no specific imaging follow-up is required. The gallbladder is unremarkable. There is no biliary ductal dilatation. Pancreas: Unremarkable. Spleen: Unremarkable. Adrenals/Urinary Tract: The adrenals are unremarkable. There is a 7  mm nonobstructing right lower pole stone. Small hypodense lesions in the kidneys most likely reflect benign cysts for which no specific imaging follow-up is required. There is no hydronephrosis or hydroureter. There is some excretion of contrast into the collecting systems on the delayed images. There is a small amount of gas in the bladder. Stomach/Bowel: The stomach is unremarkable. There is no evidence of bowel obstruction. There is no abnormal bowel wall thickening or inflammatory change. There is colonic diverticulosis without evidence of acute diverticulitis. The appendix is normal. Vascular/Lymphatic: There is calcified plaque throughout the nonaneurysmal abdominal aorta. The major branch vessels are patent. The main portal and splenic veins are patent. There is no abdominal or pelvic lymphadenopathy. Reproductive: There is a  3.8 cm x 2.9 cm by 4.4 cm peripherally enhancing collection in the region of the right seminal vesicle superior to the prostate. There may also be a 1.0 cm abscess in the right aspect of the prostate (3-89). There is extensive gas tracking from the scrotum on the left into the soft tissues of the lower anterior pelvic wall. There is coarse calcification in the right scrotum. Other: There is no ascites or free intraperitoneal air. Musculoskeletal: There is no acute osseous abnormality or suspicious osseous lesion. IMPRESSION: 1. Extensive gas tracking from the scrotum on the left into the soft tissues of the lower anterior pelvic wall highly concerning for necrotizing infection/Fournier's gangrene. 2. 3.8 cm x 2.9 cm x 4.4 cm peripherally enhancing collection in the region of the right seminal vesicle superior to the prostate concerning for abscess, and additional possible small 1.0 cm prostatic abscess. 3. Small amount of gas in the bladder may be related to recent instrumentation or spread of infection. 4. 7 mm nonobstructing right lower pole renal stone. Critical Value/emergent results  were called by telephone at the time of interpretation on 01/05/2022 at 10:06 am to provider [facility] , who verbally acknowledged these results. Electronically Signed   By: Valetta Mole M.D.   On: 01/05/2022 10:11   DG Chest Portable 1 View  Result Date: 01/05/2022 CLINICAL DATA:  Altered mental status EXAM: PORTABLE CHEST 1 VIEW COMPARISON:  CXR 07/09/19 FINDINGS: No pleural effusion. No pneumothorax. No displaced rib fracture. Normal cardiac and mediastinal contours. Visualized upper abdomen is unremarkable. Degenerative changes of the right AC and glenohumeral joint. IMPRESSION: No focal airspace opacity. Electronically Signed   By: Marin Roberts M.D.   On: 01/05/2022 08:50   CT Head Wo Contrast  Result Date: 01/03/2022 CLINICAL DATA:  Neurological deficit, nausea EXAM: CT HEAD WITHOUT CONTRAST TECHNIQUE: Contiguous axial images were obtained from the base of the skull through the vertex without intravenous contrast. RADIATION DOSE REDUCTION: This exam was performed according to the departmental dose-optimization program which includes automated exposure control, adjustment of the mA and/or kV according to patient size and/or use of iterative reconstruction technique. COMPARISON:  07/22/2019 FINDINGS: Brain: No acute intracranial findings are seen. There are no signs of bleeding within the cranium. Minimal calcifications are seen in basal ganglia. Ventricles are not dilated. Cortical sulci are prominent. There is 8 mm extra-axial calcification inseparable from the meninges in the left frontoparietal region. There is no adjacent edema or mass effect. This finding appears stable. Vascular: Unremarkable. Skull: No fracture is seen. Sinuses/Orbits: Unremarkable. Other: There is increased amount of CSF in the sella suggesting partial empty sella. IMPRESSION: No acute intracranial findings are seen in noncontrast CT brain. Atrophy. There is 8 mm coarse extra-axial calcification in the left  frontoparietal region close to midline with no interval change suggesting dystrophic dural calcification or small calcified meningioma with no significant interval change. Electronically Signed   By: Elmer Picker M.D.   On: 01/03/2022 20:04   US SCROTUM W/DOPPLER  Result Date: 01/03/2022 CLINICAL DATA:  Scrotal pain. EXAM: SCROTAL ULTRASOUND DOPPLER ULTRASOUND OF THE TESTICLES TECHNIQUE: Complete ultrasound examination of the testicles, epididymis, and other scrotal structures was performed. Color and spectral Doppler ultrasound were also utilized to evaluate blood flow to the testicles. COMPARISON:  None Available. FINDINGS: Right testicle Measurements: 3.3 x 2.0 x 2.8 cm. Symmetric and homogeneous echotexture without focal lesion. Patent arterial and venous blood flow. Left testicle Measurements: 3.2 x 2.2 x 3.0 cm. Symmetric and homogeneous  echotexture without focal lesion. Patent arterial and venous blood flow. Right epididymis:  1 cm epididymal cyst. Left epididymis:  Normal in size and appearance. Hydrocele: Moderate-sized bilateral hydroceles. There is a large complex fluid collection in the scrotum between the testicles. This measures a maximum of 5 x 3 cm and contains echogenic floating debris and could be a liquified hematoma or an abscess. Recommend correlation with clinical findings. Varicocele:  None visualized. Pulsed Doppler interrogation of both testes demonstrates normal low resistance arterial and venous waveforms bilaterally. IMPRESSION: 1. Normal sonographic appearance of both testicles with normal blood flow. 2. Moderate-sized bilateral hydroceles. 3. Large complex fluid collection in the scrotum between the testicles. This could be a liquified hematoma or an abscess. Recommend correlation with clinical findings. Electronically Signed   By: Marijo Sanes M.D.   On: 01/03/2022 11:11    Labs:  CBC: Recent Labs    01/07/22 1405 01/08/22 0333 01/09/22 0234 01/10/22 0355  WBC  15.4* 13.2* 9.8 9.8  HGB 9.8* 8.8* 8.5* 8.9*  HCT 29.3* 26.5* 26.6* 27.4*  PLT 188 162 178 191    COAGS: Recent Labs    04/24/21 1410  INR 1.0    BMP: Recent Labs    01/07/22 1228 01/08/22 0333 01/10/22 0355 01/12/22 0703  NA 143 140 141 142  K 4.6 3.7 3.6 2.9*  CL 109 108 103 103  CO2 21* '23 29 24  '$ GLUCOSE 153* 150* 115* 140*  BUN '22 18 13 10  '$ CALCIUM 9.7 9.8 9.7 9.4  CREATININE 1.17 1.12 0.90 0.91  GFRNONAA >60 >60 >60 >60    LIVER FUNCTION TESTS: Recent Labs    03/08/21 1137 04/24/21 0921 01/05/22 0900  BILITOT 0.2 0.3 0.6  AST 16 12* 16  ALT '8 11 12  '$ ALKPHOS 82 51 71  PROT 7.9 7.3 7.7  ALBUMIN 3.9 3.5 2.6*    TUMOR MARKERS: No results for input(s): "AFPTM", "CEA", "CA199", "CHROMGRNA" in the last 8760 hours.  Assessment and Plan:  Timothy Davenport is a 71 yo male admitted to [facility] with Fournier gangrene being seen today for a pelvic abscess measuring 4.1 x 4.0 cm adjacent to sigmoid colon and right seminal vesicle. The case has been reviewed and approved by Dr Anselm Pancoast for image-guided abscess aspiration with possible drain placement tentatively for 01/12/22.  Risks and benefits discussed with the patient including bleeding, infection, damage to adjacent structures, bowel perforation/fistula connection, and sepsis.  All of the patient's questions were answered, patient is agreeable to proceed. Consent signed and in IR suite.   Thank you for this interesting consult.  I greatly enjoyed meeting Timothy Davenport and look forward to participating in their care.  A copy of this report was sent to the requesting provider on this date.  Electronically Signed: Lura Em, PA-C 01/12/2022, 11:59 AM   I spent a total of 40 Minutes    in face to face in clinical consultation, greater than 50% of which was counseling/coordinating care for pelvic abscess.

## 2022-01-12 NOTE — Progress Notes (Signed)
Patient ID: Timothy Davenport, male   DOB: January 28, 1951, 71 y.o.   MRN: [id number] [facility] Progress Note:   3 Days Post-Op   THE PLAN  Continue wet/dry dressing changes.   Await formal report on the pelvis CT done this morning.  I have reviewed the CT.    Subjective: Mental status is clear.  Complaints none. Objective: Vital signs in last 24 hours: Temp:  [98.5 F (36.9 C)-99.5 F (37.5 C)] 98.6 F (37 C) (11/24 0526) Pulse Rate:  [73-85] 73 (11/24 0526) Resp:  [16-20] 18 (11/24 0526) BP: (120-139)/(80-93) 132/87 (11/24 0526) SpO2:  [97 %-100 %] 97 % (11/24 0526)  Intake/Output from previous day: 11/23 0701 - 11/24 0700 In: 576.7 [P.O.:360; IV Piggyback:216.7] Out: 600 [Urine:600] Intake/Output this shift: No intake/output data recorded.  Physical Exam: Work of breathing is normal.  Dressings in place  Lab Results:  Results for orders placed or performed during the hospital encounter of 01/05/22 (from the past 48 hour(s))  Glucose, capillary     Status: Abnormal   Collection Time: 01/10/22  8:59 AM  Result Value Ref Range   Glucose-Capillary 115 (H) 70 - 99 mg/dL    Comment: Glucose reference range applies only to samples taken after fasting for at least 8 hours.  Glucose, capillary     Status: Abnormal   Collection Time: 01/10/22 11:43 AM  Result Value Ref Range   Glucose-Capillary 139 (H) 70 - 99 mg/dL    Comment: Glucose reference range applies only to samples taken after fasting for at least 8 hours.   Comment 1 Notify RN   Glucose, capillary     Status: Abnormal   Collection Time: 01/10/22  5:02 PM  Result Value Ref Range   Glucose-Capillary 132 (H) 70 - 99 mg/dL    Comment: Glucose reference range applies only to samples taken after fasting for at least 8 hours.   Comment 1 Notify RN   Glucose, capillary     Status: Abnormal   Collection Time: 01/10/22  7:25 PM  Result Value Ref Range   Glucose-Capillary 138 (H) 70 - 99 mg/dL    Comment: Glucose  reference range applies only to samples taken after fasting for at least 8 hours.  Glucose, capillary     Status: Abnormal   Collection Time: 01/11/22  8:54 AM  Result Value Ref Range   Glucose-Capillary 167 (H) 70 - 99 mg/dL    Comment: Glucose reference range applies only to samples taken after fasting for at least 8 hours.  Glucose, capillary     Status: Abnormal   Collection Time: 01/11/22  9:40 AM  Result Value Ref Range   Glucose-Capillary 157 (H) 70 - 99 mg/dL    Comment: Glucose reference range applies only to samples taken after fasting for at least 8 hours.  Glucose, capillary     Status: Abnormal   Collection Time: 01/11/22 12:08 PM  Result Value Ref Range   Glucose-Capillary 143 (H) 70 - 99 mg/dL    Comment: Glucose reference range applies only to samples taken after fasting for at least 8 hours.  Glucose, capillary     Status: Abnormal   Collection Time: 01/11/22  4:09 PM  Result Value Ref Range   Glucose-Capillary 107 (H) 70 - 99 mg/dL    Comment: Glucose reference range applies only to samples taken after fasting for at least 8 hours.  Glucose, capillary     Status: Abnormal   Collection Time: 01/11/22  7:57  PM  Result Value Ref Range   Glucose-Capillary 174 (H) 70 - 99 mg/dL    Comment: Glucose reference range applies only to samples taken after fasting for at least 8 hours.  Phosphorus     Status: Abnormal   Collection Time: 01/12/22  7:03 AM  Result Value Ref Range   Phosphorus 1.8 (L) 2.5 - 4.6 mg/dL    Comment: Performed at [facility] [address], [place], [place] [address]    Radiology/Results: No results found.  Anti-infectives: Anti-infectives (From admission, onward)    Start     Dose/Rate Route Frequency Ordered Stop   01/11/22 1400  ceFAZolin (ANCEF) IVPB 2g/100 mL premix        2 g 200 mL/hr over 30 Minutes Intravenous Every 8 hours 01/10/22 1330     01/08/22 1400  cefTRIAXone (ROCEPHIN) 2 g in sodium chloride 0.9 % 100 mL IVPB   Status:  Discontinued        2 g 200 mL/hr over 30 Minutes Intravenous Every 24 hours 01/08/22 1250 01/10/22 1330   01/07/22 1500  piperacillin-tazobactam (ZOSYN) IVPB 3.375 g  Status:  Discontinued        3.375 g 12.5 mL/hr over 240 Minutes Intravenous Every 8 hours 01/07/22 1357 01/08/22 1244   01/06/22 1100  vancomycin (VANCOCIN) IVPB 1000 mg/200 mL premix  Status:  Discontinued        1,000 mg 200 mL/hr over 60 Minutes Intravenous Every 24 hours 01/05/22 0946 01/05/22 0949   01/06/22 0000  vancomycin variable dose per unstable renal function (pharmacist dosing)  Status:  Discontinued         Does not apply See admin instructions 01/05/22 0949 01/05/22 1514   01/05/22 2200  ceFEPIme (MAXIPIME) 2 g in sodium chloride 0.9 % 100 mL IVPB  Status:  Discontinued        2 g 200 mL/hr over 30 Minutes Intravenous Every 12 hours 01/05/22 0946 01/05/22 1514   01/05/22 2000  piperacillin-tazobactam (ZOSYN) IVPB 3.375 g  Status:  Discontinued        3.375 g 12.5 mL/hr over 240 Minutes Intravenous Every 8 hours 01/05/22 1711 01/07/22 1250   01/05/22 1515  linezolid (ZYVOX) IVPB 600 mg  Status:  Discontinued        600 mg 300 mL/hr over 60 Minutes Intravenous Every 12 hours 01/05/22 1514 01/08/22 1244   01/05/22 0945  ceFEPIme (MAXIPIME) 2 g in sodium chloride 0.9 % 100 mL IVPB        2 g 200 mL/hr over 30 Minutes Intravenous  Once 01/05/22 0932 01/05/22 1038   01/05/22 0945  metroNIDAZOLE (FLAGYL) IVPB 500 mg        500 mg 100 mL/hr over 60 Minutes Intravenous  Once 01/05/22 0932 01/05/22 1103   01/05/22 0945  vancomycin (VANCOCIN) IVPB 1000 mg/200 mL premix  Status:  Discontinued        1,000 mg 200 mL/hr over 60 Minutes Intravenous  Once 01/05/22 0932 01/05/22 0944   01/05/22 0945  vancomycin (VANCOREADY) IVPB 2000 mg/400 mL        2,000 mg 200 mL/hr over 120 Minutes Intravenous  Once 01/05/22 0944 01/05/22 1256       Assessment/Plan: Problem List: Patient Active Problem List    Diagnosis Date Noted   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) 04/24/2021   Acute GI bleeding 04/24/2021   Erectile dysfunction due to diseases classified elsewhere 03/02/2020   Wheelchair dependence 11/27/2019   Spasticity 08/03/2019   Paraplegia ([place]) 07/07/2019   Neurogenic bowel 07/07/2019   Neurogenic bladder 07/07/2019   Thoracic myelopathy 06/30/2019    CT done.  I have reviewed and I need official review and comparison when considering need for drainage--defer to urology 3 Days Post-Op    LOS: 7 days   Matt B. Hassell Done, MD, [facility] [phone] to reach the surgeon on call.    01/12/2022 8:45 AM

## 2022-01-12 NOTE — Progress Notes (Signed)
Pt given IV dilaudid before wound cre. Pt tolerated procedure well. No s/s of infection, no discharge or foul odor. Louanne Skye 01/12/22 4:46 PM

## 2022-01-12 NOTE — TOC Progression Note (Signed)
Transition of Care [facility]) - Progression Note    Patient Details  Name: Timothy Davenport MRN: [id number] Date of Birth: March 06, 1950  [facility]) CM/SW Contact  Coralee Pesa, [place] Phone Number: 01/12/2022, 1:45 PM  Clinical Narrative:    CSW spoke with pt's spouse, as pt is in procedure. Spouse chooses [facility], facility notified. Pt's PASSR is still pending as PASSR has been closed. Will plan to DC when it results.    Expected Discharge Plan: [place] Barriers to Discharge: Continued Medical Work up  Expected Discharge Plan and Services Expected Discharge Plan: [place] In-house Referral: Clinical Social Work     Living arrangements for the past 2 months: Single Family Home                                       Social Determinants of Health (SDOH) Interventions    Readmission Risk Interventions    04/26/2021    1:38 PM  Readmission Risk Prevention Plan  Post Dischage Appt Complete  Medication Screening Complete  Transportation Screening Complete

## 2022-01-12 NOTE — Progress Notes (Addendum)
HD#7 Subjective:   Summary: Timothy Davenport 71 y.o. male with PMH of T2DM on insulin, paraplegia T8 spinal cord injury, neurogenic bladder, CKD 3, HLD, prior CVA who presents for worsening left scrotal infection, admitted for fournier gangrene.     Overnight Events: none  Timothy Davenport denies any pain or new concerns today.    Objective:  Vital signs in last 24 hours: Vitals:   01/11/22 1610 01/11/22 1951 01/12/22 0526 01/12/22 0854  BP: (!) 139/93 135/89 132/87 (!) 125/92  Pulse: 82 78 73 75  Resp: '16 20 18 17  '$ Temp: 98.5 F (36.9 C) 99.5 F (37.5 C) 98.6 F (37 C) 98.5 F (36.9 C)  TempSrc: Oral Oral Oral Oral  SpO2: 100% 100% 97% 100%  Weight:      Height:        Physical Exam:  Constitutional: appears resting comfortably in bed, in no acute distress HENT: normocephalic atraumatic Pulmonary/Chest: normal work of breathing on room air MSK: normal bulk and tone Neurological: alert, no focal deficits Skin: warm and dry, clean dressing over groin with foley catheter in place Psych: normal mood and behavior  Filed Weights   01/05/22 0835 01/05/22 1145 01/09/22 0953  Weight: 81.6 kg 77.1 kg 77.1 kg     Intake/Output Summary (Last 24 hours) at 01/12/2022 1112 Last data filed at 01/12/2022 0531 Gross per 24 hour  Intake 456.67 ml  Output 600 ml  Net -143.33 ml   Net IO Since Admission: 1,973.73 mL [01/12/22 1112]  Pertinent Labs:    Latest Ref Rng & Units 01/10/2022    3:55 AM 01/09/2022    2:34 AM 01/08/2022    3:33 AM  CBC  WBC 4.0 - 10.5 K/uL 9.8  9.8  13.2   Hemoglobin 13.0 - 17.0 g/dL 8.9  8.5  8.8   Hematocrit 39.0 - 52.0 % 27.4  26.6  26.5   Platelets 150 - 400 K/uL 191  178  162        Latest Ref Rng & Units 01/12/2022    7:03 AM 01/10/2022    3:55 AM 01/08/2022    3:33 AM  CMP  Glucose 70 - 99 mg/dL 140  115  150   BUN 8 - 23 mg/dL '10  13  18   '$ Creatinine 0.61 - 1.24 mg/dL 0.91  0.90  1.12   Sodium 135 - 145 mmol/L 142  141  140    Potassium 3.5 - 5.1 mmol/L 2.9  3.6  3.7   Chloride 98 - 111 mmol/L 103  103  108   CO2 22 - 32 mmol/L '24  29  23   '$ Calcium 8.9 - 10.3 mg/dL 9.4  9.7  9.8     Imaging: CT PELVIS W CONTRAST  Result Date: 01/12/2022 CLINICAL DATA:  Pelvic abscess. EXAM: CT PELVIS WITH CONTRAST TECHNIQUE: Multidetector CT imaging of the pelvis was performed using the standard protocol following the bolus administration of intravenous contrast. RADIATION DOSE REDUCTION: This exam was performed according to the departmental dose-optimization program which includes automated exposure control, adjustment of the mA and/or kV according to patient size and/or use of iterative reconstruction technique. CONTRAST:  102m OMNIPAQUE IOHEXOL 350 MG/ML SOLN COMPARISON:  01/08/2022 FINDINGS: Urinary Tract: Distal ureters are unremarkable. Urinary bladder is decompressed by a Foley catheter. Bowel: Normal appearance of the appendix. Normal appearance of large bowel loops. Normal appearance of small bowel loops. Adjacent to the sigmoid colon and RIGHT seminal vesicle, there is a rim  enhancing fluid collection measuring 4.1 x 4.0 centimeters, previously 3.6 x 3.7 centimeters at the same level. No new fluid collections. Vascular/Ly Mphatic: There is dense atherosclerotic calcification of the abdominal aorta and its branches. No retroperitoneal or mesenteric adenopathy. Reproductive: Prostate gland unremarkable. Abscess collection adjacent to RIGHT seminal vesicle, described above. Other: Large surgical defect involving the LOWER anterior abdominal wall and perineum, LEFT greater than RIGHT. The appearance is similar to prior study. Musculoskeletal: There is a nonspecific 1.1 centimeter low-attenuation lesion within the posterior RIGHT ilium. IMPRESSION: 1. Slight increase in size of abscess collection adjacent to the sigmoid colon and RIGHT seminal vesicle, now measuring 4.1 x 4.0 centimeters, previously 3.6 x 3.7 centimeters. No new fluid  collections. 2. Similar large surgical defect involving the LOWER anterior abdominal wall and perineum. 3. Nonspecific 1.1 centimeter low-attenuation lesion within the posterior RIGHT ilium. 4.  Aortic Atherosclerosis (ICD10-I70.0). Electronically Signed   By: Nolon Nations M.D.   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 with a pertinent PMH of of T2DM on insulin, paraplegia T8 spinal cord injury, neurogenic bladder, CKD 3, HLD, prior CVA who presents for worsening left scrotal infection, admitted for fournier gangrene.     Fournier gangrene Right seminal vesicle abscess S/p 3rd I&D on 11/21. NG on blood culture. Currently on cefazolin given Citrobacter koseri is pan-sensitive. Patient will likely need prolonged antibiotic therapy for this infection. General surgery noted plastic surgery consult about wound closure, plan to consult them early next week. CT a/p showed increasing size of abscess adjacent to right seminal vesicle and sigmoid colon. Consulted IR for possible aspiration of the abscess. Discussed with Dr. Gale Journey from ID about prolonged antibiotic therapy, would keep coverage broad for now.  -appreciate urology and general surgery recommendations -consulted ID and IR, appreciate recommendations -abx day 8, stopped cefazolin (day 2), start Zosyn (day 1) -ID recommends starting Zosyn given possible polymicrobial infection even though citrobacter isolated -plan for plastic surgery consult next week to evaluate wound closure -continue wound care  Hypophosphatemia  Hypokalemia Phosphorus 1.8 and potassium 2.9 today. He received potassium phosphate on 11/22. -potassium phosphate 30 mmol once  -repeat BMP in AM -continue regular diet along with Ensure BID and MVI,  appreciate RD recommendations  T2DM on insulin CBG has been well controlled. Still decreased appetite but slowly improving. Continue to encourage increasing PO intake.  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 He is on dantrolene for spasms and oxybutynin.  -dantrolene 25 mg TID -oxybutynin 7.5 mg BID  HTN Has been normotensive. PTA on losartan. Will continue holding losartan since normotensive.   IDA Hgb has been steady. Hold off on his iron supplementation given enlarging abscess.  Hx of CVA PTA on simvastatin and aspirin.  -continue home simvastatin   Diet: Normal IVF: None,None VTE: Enoxaparin Code: Full PT/OT recs: SNF  Dispo: Anticipated discharge to Skilled nursing facility pending medical stability.   [name], DO Internal Medicine Resident PGY-1 Pager: [phone] Please contact the on call pager after 5 pm and on weekends at [phone].

## 2022-01-12 NOTE — Progress Notes (Signed)
Pharmacy Antibiotic Note  Timothy Davenport is a 71 y.o. male admitted on 01/05/2022 with  gangrene of the scrotum . Pharmacy has been consulted for Zosyn dosing.He is s/p OR debridement on 11/17 and 11/19.  Surgical cultures from 11/17 show citrobacter koseri. He is afebrile today with a normal WBC on 11/22. Per Dr. Gale Journey, no MRSA coverage indicated at this time due to lack of presence on cultures.   Plan: Start Zosyn 3.375 g q8 hours EI over 4 hours  Monitor clinical course    Height: '5\' 11"'$  (180.3 cm) Weight: 77.1 kg (169 lb 15.6 oz) IBW/kg (Calculated) : 75.3  Temp (24hrs), Avg:98.8 F (37.1 C), Min:98.5 F (36.9 C), Max:99.5 F (37.5 C)  Recent Labs  Lab 01/05/22 1818 01/06/22 0206 01/07/22 0800 01/07/22 1228 01/07/22 1405 01/08/22 0333 01/09/22 0234 01/10/22 0355 01/12/22 0703  WBC  --  14.4*  --   --  15.4* 13.2* 9.8 9.8  --   CREATININE  --  1.34* 1.10 1.17  --  1.12  --  0.90 0.91  LATICACIDVEN 1.2  --   --   --   --   --   --   --   --     Estimated Creatinine Clearance: 77 mL/min (by C-G formula based on SCr of 0.91 mg/dL).    Allergies  Allergen Reactions   Jardiance [Empagliflozin] Other (See Comments)    Fournier Gangrene 2023   Lisinopril Other (See Comments)    Angioedema   Enalapril-Hctz [Enalapril-Hydrochlorothiazide] Rash    Antimicrobials this admission: Zosyn 11/17 >> 11/20 Linezolid 11/17 >> 11/20 Ceftriaxone 11/20 >> 11/22 Cefazolin 11/23 >> 11/24  Zosyn 11/24 >>   Microbiology results: 11/17 Surgical Cx: citrobacter koseri 11/17 BCx: NG x 5 days   11/17 Ucx: Insignificant growth     Thank you for allowing pharmacy to be a part of this patient's care.  Vicenta Dunning, PharmD  PGY1 Pharmacy Resident

## 2022-01-12 NOTE — Consult Note (Signed)
[facility]    Date of Admission:  01/05/2022     Reason for Consult: fournier's gangrene    Referring Provider: Evette Doffing     Abx: 11/19-20; 11/24-c piptazo  11/23-24 cefazolin 11/20-22 ceftriaxone 11/17-19 cefepime 11/17-20 linezolid       11/15-17 cefadroxil  Assessment: 71 yo male with dm2 on insulin, t8 paraplegia (fall), neurogenic bladder intermittent straight cath, ckd3, hx cva, admitted 11/17 for progressive 2 weeks of perineal/left scrotal cellulitis found to have Fournier's gangrene  He was seen just prior to admission on 11/15 in ed and treated for presumed uti/balanitis with abx  11/17 bcx ngtd 11/17 ucx insignificant growth 11/17 operative cx rare citrobacter koseri  He underwent initial I&D by general surgery & urology 11/17 -- extension of necrosis in scrotum/perineum extending into left inguinal canal  Due to polymicrobial nature of infection, despite only citrobacter isolated, will keep on piptazo for now until all surgery/drainage is done and will transition to PO abx to finish course of treatment. He also had received abx prior to surgery  No mrsa is found which usually/readily grow on culture if a player   There is a repeat ct 11/24 of the pelvis that showed slightly enlarging abscess pending ir placement of percutaneous drain  Plan: Restart piptazo; when all I&D is done will transition to amox-clav  Please send culture of the abscess with ir drainage If culture susceptible to oral agent could do a long course of oral abx Defer picc until then Discussed with primary team   I spent 75 minute reviewing data/chart, and coordinating care and >50% direct face to face time providing counseling/discussing diagnostics/treatment plan with patient   ------------------------------------------------ 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 male with dm2 on insulin, t8 paraplegia (fall), neurogenic bladder intermittent straight cath, ckd3, hx cva, admitted 11/17 for progressive 2 weeks of perineal/left scrotal cellulitis found to have Fournier's gangrene  Hx via chart/signout/discussion with his wife  He had 2 weeks feeling malaise/unwell and then with some confusion.  He was seen just prior to admission on 11/15 in ed and treated for presumed uti/balanitis with abx cefadroxil and fluconazole  However, he returned 11/17 due to increased purulent drainage in perineum and increased confusion  No fever on presentation and wbc only slightly elevated Ct abd pelv showed emphysematous changes and swelling around perineum Urology/surgery evaluated and taken him to I&D on the day of admissions  He had repeat I&D done again on 11/19 and 11/21  Repeat ct 11/24 showed increased right sided intrapelvic abscess awaiting ir drainage  Hiv screen negative Bcx ngtd 11/17 operative cx only showed citrobacter  Abx narrowed to cefazolin  Family History  Problem Relation Age of Onset   Hypertension Mother    Hypertension Father     Social History   Tobacco Use   Smoking status: Never   Smokeless tobacco: Never  Vaping Use   Vaping Use: Never used  Substance Use Topics   Alcohol use: Yes    Comment: 2-3 drinks week   Drug use: Yes    Types: Marijuana    Comment: occasionally    Allergies  Allergen Reactions   Jardiance [Empagliflozin] Other (See Comments)    Fournier Gangrene 2023  Lisinopril Other (See Comments)    Angioedema   Enalapril-Hctz [Enalapril-Hydrochlorothiazide] Rash    Review of Systems: ROS All Other ROS was negative, except mentioned above   Past Medical History:  Diagnosis Date   Colon polyp    Diabetes mellitus without complication (HCC)    Diabetic neuropathy ([place])    Fournier gangrene while on  SGLT2i 01/05/2022   HTN (hypertension)    Patella fracture    Renal calculi        Scheduled Meds:  Chlorhexidine Gluconate Cloth  6 each Topical Daily   dantrolene  25 mg Oral TID   enoxaparin (LOVENOX) injection  40 mg Subcutaneous Daily   feeding supplement  237 mL Oral BID BM   insulin aspart  0-9 Units Subcutaneous TID WC   multivitamin with minerals  1 tablet Oral Daily   oxybutynin  7.5 mg Oral BID   polyethylene glycol  17 g Oral Daily   sertraline  100 mg Oral Daily   simvastatin  20 mg Oral q1800   Continuous Infusions:   ceFAZolin (ANCEF) IV 2 g (01/12/22 0526)   lactated ringers     And   lactated ringers     PRN Meds:.acetaminophen, HYDROmorphone (DILAUDID) injection, oxyCODONE   OBJECTIVE: Blood pressure (!) 125/92, pulse 75, temperature 98.5 F (36.9 C), temperature source Oral, resp. rate 17, height '5\' 11"'$  (1.803 m), weight 77.1 kg, SpO2 100 %.  Physical Exam  General/constitutional: no distress, pleasant HEENT: Normocephalic, PER, Conj Clear, EOMI, Oropharynx clear Neck supple CV: rrr no mrg Lungs: clear to auscultation, normal respiratory effort Abd: Soft, Nontender Ext: no edema Gu/Skin: No Rash; groin dressing c/d; foley in place Neuro: nonfocal MSK: no peripheral joint swelling/tenderness/warmth; back spines nontender     Lab Results Lab Results  Component Value Date   WBC 9.8 01/10/2022   HGB 8.9 (L) 01/10/2022   HCT 27.4 (L) 01/10/2022   MCV 77.8 (L) 01/10/2022   PLT 191 01/10/2022    Lab Results  Component Value Date   CREATININE 0.91 01/12/2022   BUN 10 01/12/2022   NA 142 01/12/2022   K 2.9 (L) 01/12/2022   CL 103 01/12/2022   CO2 24 01/12/2022    Lab Results  Component Value Date   ALT 12 01/05/2022   AST 16 01/05/2022   ALKPHOS 71 01/05/2022   BILITOT 0.6 01/05/2022      Microbiology: Recent Results (from the past 240 hour(s))  Urine Culture     Status: Abnormal   Collection Time: 01/03/22  7:20 PM    Specimen: Urine, Catheterized  Result Value Ref Range Status   Specimen Description URINE, CATHETERIZED  Final   Special Requests   Final    NONE Performed at [facility], [address], [place], [place] [address]    Culture MULTIPLE SPECIES PRESENT, SUGGEST RECOLLECTION (A)  Final   Report Status 01/05/2022 FINAL  Final  Blood culture (routine x 2)     Status: None   Collection Time: 01/05/22  8:36 AM   Specimen: BLOOD  Result Value Ref Range Status   Specimen Description BLOOD RIGHT ANTECUBITAL  Final   Special Requests   Final    BOTTLES DRAWN AEROBIC AND ANAEROBIC Blood Culture results may not be optimal due to an inadequate volume of blood received in culture bottles   Culture   Final    NO GROWTH 5 DAYS Performed at [facility], [place] [address], [place], [place] [address]  Report Status 01/10/2022 FINAL  Final  Urine Culture     Status: Abnormal   Collection Time: 01/05/22  8:36 AM   Specimen: Urine, Clean Catch  Result Value Ref Range Status   Specimen Description URINE, CLEAN CATCH  Final   Special Requests NONE  Final   Culture (A)  Final    <10,000 COLONIES/mL INSIGNIFICANT GROWTH Performed at [facility], [place] [address], [place], [place] [address]    Report Status 01/06/2022 FINAL  Final  Aerobic/Anaerobic Culture w Gram Stain (surgical/deep wound)     Status: None   Collection Time: 01/05/22  3:00 PM   Specimen: Other Source; Body Fluid  Result Value Ref Range Status   Specimen Description WOUND SCROTUM  Final   Special Requests NONE  Final   Gram Stain   Final    ABUNDANT WBC PRESENT, PREDOMINANTLY PMN MODERATE GRAM NEGATIVE RODS RARE GRAM POSITIVE RODS    Culture   Final    RARE CITROBACTER KOSERI NO ANAEROBES ISOLATED Performed at [facility], [address] [place], [place] [address]    Report Status 01/10/2022 FINAL  Final   Organism ID, Bacteria CITROBACTER KOSERI  Final      Susceptibility   Citrobacter koseri -  MIC*    CEFAZOLIN <=4 SENSITIVE Sensitive     CEFEPIME <=0.12 SENSITIVE Sensitive     CEFTAZIDIME <=1 SENSITIVE Sensitive     CEFTRIAXONE <=0.25 SENSITIVE Sensitive     CIPROFLOXACIN <=0.25 SENSITIVE Sensitive     GENTAMICIN <=1 SENSITIVE Sensitive     IMIPENEM <=0.25 SENSITIVE Sensitive     TRIMETH/SULFA <=20 SENSITIVE Sensitive     PIP/TAZO <=4 SENSITIVE Sensitive     * RARE CITROBACTER KOSERI  Blood culture (routine x 2)     Status: None   Collection Time: 01/05/22  6:18 PM   Specimen: BLOOD RIGHT HAND  Result Value Ref Range Status   Specimen Description BLOOD RIGHT HAND  Final   Special Requests   Final    BOTTLES DRAWN AEROBIC AND ANAEROBIC Blood Culture results may not be optimal due to an excessive volume of blood received in culture bottles   Culture   Final    NO GROWTH 5 DAYS Performed at [facility], [place] [address], [place], [place] [address]    Report Status 01/10/2022 FINAL  Final     Serology:    Imaging: If present, new imagings (plain films, ct scans, and mri) have been personally visualized and interpreted; radiology reports have been reviewed. Decision making incorporated into the Impression / Recommendations.   11/17 ct abd pelv 1. Extensive gas tracking from the scrotum on the left into the soft tissues of the lower anterior pelvic wall highly concerning for necrotizing infection/Fournier's gangrene. 2. 3.8 cm x 2.9 cm x 4.4 cm peripherally enhancing collection in the region of the right seminal vesicle superior to the prostate concerning for abscess, and additional possible small 1.0 cm prostatic abscess. 3. Small amount of gas in the bladder may be related to recent instrumentation or spread of infection. 4. 7 mm nonobstructing right lower pole renal stone.  11/24 ct abd/pelv 1. Slight increase in size of abscess collection adjacent to the sigmoid colon and RIGHT seminal vesicle, now measuring 4.1 x 4.0 centimeters, previously 3.6 x 3.7  centimeters. No new fluid collections. 2. Similar large surgical defect involving the LOWER anterior abdominal wall and perineum. 3. Nonspecific 1.1 centimeter low-attenuation lesion within the posterior RIGHT ilium.  4.  Aortic Atherosclerosis  Jabier Mutton, [facility] [place] [phone] pager    01/12/2022, 10:07 AM

## 2022-01-12 NOTE — Procedures (Signed)
Vascular and Interventional Radiology Procedure Note  Patient: Timothy Davenport DOB: 1950-08-11 Medical Record Number: [id number] Note Date/Time: 01/12/22 2:38 PM   Performing Physician: Michaelle Birks, MD Assistant(s): None  Diagnosis: Pelvic abscess  Procedure: DRAINAGE CATHETER PLACEMENT OF RIGHT PELVIC ABSCESS  Anesthesia: Conscious Sedation Complications: None Estimated Blood Loss: Minimal Specimens: Sent for Gram Stain, Aerobe Culture, Anerobe Culture, and Cytology  Findings:  Successful CT-guided transgluteal placement of 10 F catheter into R pelvic abscess.  Plan:  - Flush drain with 5 mL Normal Saline every 8 hours. - Follow up drain evaluation / sinogram in 2 week(s).  See detailed procedure note with images in PACS. The patient tolerated the procedure well without incident or complication and was returned to Floor Bed in stable condition.    Michaelle Birks, MD [facility] Radiology   Pager. [employer]

## 2022-01-12 NOTE — Progress Notes (Signed)
3 Days Post-Op   Subjective/Chief Complaint:  1 - Fournier's Gangrene - necrotizing GU and lower abdominal skin infection s/p OR debridement 11/17 and 11/19 by [facility]. Dressing change 11/22 all edges viable. CX pan-sensitive Citrobacter placed on Ancef.  2 - Possible Pelvic / Rt Seminal Vesical Abscess - about 3.5cm fluid colledtion in very deep pelvis near Rt SV tip.Repeat CT pending for 11/24.   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  Today "Timothy Davenport" is stable. Repeat pelvic imaging with some slight progression of unusual SV tip fluid collection.   Objective: Vital signs in last 24 hours: Temp:  [98.5 F (36.9 C)-99.5 F (37.5 C)] 98.5 F (36.9 C) (11/24 0854) Pulse Rate:  [73-82] 75 (11/24 0854) Resp:  [16-20] 17 (11/24 0854) BP: (125-139)/(87-93) 125/92 (11/24 0854) SpO2:  [97 %-100 %] 100 % (11/24 0854) Last BM Date : 01/10/22  Intake/Output from previous day: 11/23 0701 - 11/24 0700 In: 576.7 [P.O.:360; IV Piggyback:216.7] Out: 600 [Urine:600] Intake/Output this shift: No intake/output data recorded.   NAD, AOx3 RRR Minimal abd tenderness Large, complex perineal /abdomina / penile wound with wet to dry dressing in place. All edges clean/viable.   Lab Results:  Recent Labs    01/10/22 0355  WBC 9.8  HGB 8.9*  HCT 27.4*  PLT 191   BMET Recent Labs    01/10/22 0355  NA 141  K 3.6  CL 103  CO2 29  GLUCOSE 115*  BUN 13  CREATININE 0.90  CALCIUM 9.7   PT/INR No results for input(s): "LABPROT", "INR" in the last 72 hours. ABG No results for input(s): "PHART", "HCO3" in the last 72 hours.  Invalid input(s): "PCO2", "PO2"  Studies/Results: CT PELVIS W CONTRAST  Result Date: 01/12/2022 CLINICAL DATA:  Pelvic abscess. EXAM: CT PELVIS WITH CONTRAST TECHNIQUE: Multidetector CT imaging of the pelvis was performed using the standard protocol following the bolus administration of intravenous contrast. RADIATION DOSE  REDUCTION: This exam was performed according to the departmental dose-optimization program which includes automated exposure control, adjustment of the mA and/or kV according to patient size and/or use of iterative reconstruction technique. CONTRAST:  51m OMNIPAQUE IOHEXOL 350 MG/ML SOLN COMPARISON:  01/08/2022 FINDINGS: Urinary Tract: Distal ureters are unremarkable. Urinary bladder is decompressed by a Foley catheter. Bowel: Normal appearance of the appendix. Normal appearance of large bowel loops. Normal appearance of small bowel loops. Adjacent to the sigmoid colon and RIGHT seminal vesicle, there is a rim enhancing fluid collection measuring 4.1 x 4.0 centimeters, previously 3.6 x 3.7 centimeters at the same level. No new fluid collections. Vascular/Ly Mphatic: There is dense atherosclerotic calcification of the abdominal aorta and its branches. No retroperitoneal or mesenteric adenopathy. Reproductive: Prostate gland unremarkable. Abscess collection adjacent to RIGHT seminal vesicle, described above. Other: Large surgical defect involving the LOWER anterior abdominal wall and perineum, LEFT greater than RIGHT. The appearance is similar to prior study. Musculoskeletal: There is a nonspecific 1.1 centimeter low-attenuation lesion within the posterior RIGHT ilium. IMPRESSION: 1. Slight increase in size of abscess collection adjacent to the sigmoid colon and RIGHT seminal vesicle, now measuring 4.1 x 4.0 centimeters, previously 3.6 x 3.7 centimeters. No new fluid collections. 2. Similar large surgical defect involving the LOWER anterior abdominal wall and perineum. 3. Nonspecific 1.1 centimeter low-attenuation lesion within the posterior RIGHT ilium. 4.  Aortic Atherosclerosis (ICD10-I70.0). Electronically Signed   By: ENolon NationsM.D.   On: 01/12/2022 09:01    Anti-infectives: Anti-infectives (From admission,  onward)    Start     Dose/Rate Route Frequency Ordered Stop   01/11/22 1400  ceFAZolin  (ANCEF) IVPB 2g/100 mL premix        2 g 200 mL/hr over 30 Minutes Intravenous Every 8 hours 01/10/22 1330     01/08/22 1400  cefTRIAXone (ROCEPHIN) 2 g in sodium chloride 0.9 % 100 mL IVPB  Status:  Discontinued        2 g 200 mL/hr over 30 Minutes Intravenous Every 24 hours 01/08/22 1250 01/10/22 1330   01/07/22 1500  piperacillin-tazobactam (ZOSYN) IVPB 3.375 g  Status:  Discontinued        3.375 g 12.5 mL/hr over 240 Minutes Intravenous Every 8 hours 01/07/22 1357 01/08/22 1244   01/06/22 1100  vancomycin (VANCOCIN) IVPB 1000 mg/200 mL premix  Status:  Discontinued        1,000 mg 200 mL/hr over 60 Minutes Intravenous Every 24 hours 01/05/22 0946 01/05/22 0949   01/06/22 0000  vancomycin variable dose per unstable renal function (pharmacist dosing)  Status:  Discontinued         Does not apply See admin instructions 01/05/22 0949 01/05/22 1514   01/05/22 2200  ceFEPIme (MAXIPIME) 2 g in sodium chloride 0.9 % 100 mL IVPB  Status:  Discontinued        2 g 200 mL/hr over 30 Minutes Intravenous Every 12 hours 01/05/22 0946 01/05/22 1514   01/05/22 2000  piperacillin-tazobactam (ZOSYN) IVPB 3.375 g  Status:  Discontinued        3.375 g 12.5 mL/hr over 240 Minutes Intravenous Every 8 hours 01/05/22 1711 01/07/22 1250   01/05/22 1515  linezolid (ZYVOX) IVPB 600 mg  Status:  Discontinued        600 mg 300 mL/hr over 60 Minutes Intravenous Every 12 hours 01/05/22 1514 01/08/22 1244   01/05/22 0945  ceFEPIme (MAXIPIME) 2 g in sodium chloride 0.9 % 100 mL IVPB        2 g 200 mL/hr over 30 Minutes Intravenous  Once 01/05/22 0932 01/05/22 1038   01/05/22 0945  metroNIDAZOLE (FLAGYL) IVPB 500 mg        500 mg 100 mL/hr over 60 Minutes Intravenous  Once 01/05/22 0932 01/05/22 1103   01/05/22 0945  vancomycin (VANCOCIN) IVPB 1000 mg/200 mL premix  Status:  Discontinued        1,000 mg 200 mL/hr over 60 Minutes Intravenous  Once 01/05/22 0932 01/05/22 0944   01/05/22 0945  vancomycin (VANCOREADY)  IVPB 2000 mg/400 mL        2,000 mg 200 mL/hr over 120 Minutes Intravenous  Once 01/05/22 0944 01/05/22 1256       Assessment/Plan:  No indication for additional surgical wound debridement. Pelvic fluid collection does appear a bit worse. This very well may be another nidus of infection, though extremely unusual location. I do rec consider IR apsiration v. Drain likely transperineal approach safest. Aspiration alone certainly reasonable as this is not an area prone to much physiologic fluid accumulation and may be safer than leaving drain given proximity to sigmoid/rectum, defer to IR expertise.   Greatly appreciate all teams comangement.    Alexis Frock 01/12/2022

## 2022-01-13 ENCOUNTER — Other Ambulatory Visit: Payer: Self-pay

## 2022-01-13 DIAGNOSIS — N493 Fournier gangrene: Secondary | ICD-10-CM | POA: Diagnosis not present

## 2022-01-13 LAB — BASIC METABOLIC PANEL
Anion gap: 8 (ref 5–15)
BUN: 9 mg/dL (ref 8–23)
CO2: 29 mmol/L (ref 22–32)
Calcium: 9.3 mg/dL (ref 8.9–10.3)
Chloride: 103 mmol/L (ref 98–111)
Creatinine, Ser: 0.9 mg/dL (ref 0.61–1.24)
GFR, Estimated: >60 mL/min (ref >60)
Glucose, Bld: 139 mg/dL — ABNORMAL HIGH (ref 70–99)
Potassium: 3.1 mmol/L — ABNORMAL LOW (ref 3.5–5.1)
Sodium: 140 mmol/L (ref 135–145)

## 2022-01-13 LAB — PHOSPHORUS: Phosphorus: 2.6 mg/dL (ref 2.5–4.6)

## 2022-01-13 LAB — GLUCOSE, CAPILLARY
Glucose-Capillary: 117 mg/dL — ABNORMAL HIGH (ref 70–99)
Glucose-Capillary: 367 mg/dL — ABNORMAL HIGH (ref 70–99)
Glucose-Capillary: 128 mg/dL — ABNORMAL HIGH (ref 70–99)
Glucose-Capillary: 111 mg/dL — ABNORMAL HIGH (ref 70–99)
Glucose-Capillary: 127 mg/dL — ABNORMAL HIGH (ref 70–99)

## 2022-01-13 MED ORDER — POTASSIUM PHOSPHATES 15 MMOLE/5ML IV SOLN
15.0000 mmol | Freq: Once | INTRAVENOUS | Status: AC
  Administered 2022-01-13: 15 mmol via INTRAVENOUS
  Filled 2022-01-13: qty 5

## 2022-01-13 MED ORDER — PHENOL 1.4 % MT LIQD
1.0000 | OROMUCOSAL | Status: DC | PRN
  Administered 2022-01-13: 1 via OROMUCOSAL
  Filled 2022-01-13: qty 177

## 2022-01-13 NOTE — Progress Notes (Signed)
HD#8 Subjective:   Summary: Timothy Davenport 71 y.o. male with PMH of T2DM on insulin, paraplegia T8 spinal cord injury, neurogenic bladder, CKD 3, HLD, prior CVA who presents for worsening left scrotal infection, admitted for fournier gangrene s/p 2 debridements and drainage of right pelvic abscess.  Overnight Events: No acute events overnight   Patient resting in bed comfortably without acute concerns. Requesting we call his wife to talk with her about his two cars. Reoriented to place and situation, discussed drain placement yesterday. Denies any fevers or chills. Passing his bowels well and foley cathter in place. He denies being in any pain.   Objective:  Vital signs in last 24 hours: Vitals:   01/12/22 1500 01/12/22 2111 01/13/22 0409 01/13/22 0821  BP: 124/86 124/87 (!) 133/90 (!) 131/95  Pulse: 93 76 71 69  Resp: '16  16 16  '$ Temp: 98.1 F (36.7 C) 98 F (36.7 C) 98 F (36.7 C) 98.6 F (37 C)  TempSrc: Oral  Oral Oral  SpO2: 98% 99% 100% 100%  Weight:      Height:       Supplemental O2: room air  Physical Exam:  Constitutional: no acute distress HENT: normocephalic atraumatic Eyes: conjunctiva non-erythematous Neck: supple Cardiovascular: regular rate and rhythm, no m/r/g Pulmonary/Chest: normal work of breathing on room air Abdominal: soft, non-tender, non-distended MSK: normal bulk and tone. Right sided pelvic drain in place with scant serosanguineous fluid.  Neurological: alert & oriented to person and place when reoriented.  Skin: warm and dry Psych: pleasant  Filed Weights   01/05/22 0835 01/05/22 1145 01/09/22 0953  Weight: 81.6 kg 77.1 kg 77.1 kg    Intake/Output Summary (Last 24 hours) at 01/13/2022 0827 Last data filed at 01/13/2022 0423 Gross per 24 hour  Intake 839.86 ml  Output 800 ml  Net 39.86 ml   Net IO Since Admission: 2,013.59 mL [01/13/22 0827]  Pertinent Labs:    Latest Ref Rng & Units 01/10/2022    3:55 AM 01/09/2022    2:34 AM  01/08/2022    3:33 AM  CBC  WBC 4.0 - 10.5 K/uL 9.8  9.8  13.2   Hemoglobin 13.0 - 17.0 g/dL 8.9  8.5  8.8   Hematocrit 39.0 - 52.0 % 27.4  26.6  26.5   Platelets 150 - 400 K/uL 191  178  162        Latest Ref Rng & Units 01/13/2022    2:33 AM 01/12/2022    7:03 AM 01/10/2022    3:55 AM  CMP  Glucose 70 - 99 mg/dL 139  140  115   BUN 8 - 23 mg/dL '9  10  13   '$ Creatinine 0.61 - 1.24 mg/dL 0.90  0.91  0.90   Sodium 135 - 145 mmol/L 140  142  141   Potassium 3.5 - 5.1 mmol/L 3.1  2.9  3.6   Chloride 98 - 111 mmol/L 103  103  103   CO2 22 - 32 mmol/L '29  24  29   '$ Calcium 8.9 - 10.3 mg/dL 9.3  9.4  9.7     Imaging: CT GUIDED VISCERAL FLUID DRAIN BY PERC CATH  Result Date: 01/12/2022 INDICATION: Pelvic abscess. EXAM: CT-GUIDED RIGHT PELVIC ABSCESS DRAIN PLACEMENT COMPARISON:  CT AP, 01/05/2022. CT pelvis, 01/08/2022 and 01/12/2022. MEDICATIONS: 50 mg Benadryl IV. The patient is currently admitted to the hospital and receiving intravenous antibiotics. The antibiotics were administered within an appropriate time frame prior to the  initiation of the procedure. ANESTHESIA/SEDATION: Local anesthetic and single agent sedation was employed during this procedure. A total of fentanyl 100 mcg was administered intravenously. The patient's level of consciousness and vital signs were monitored continuously by radiology nursing throughout the procedure under my direct supervision. CONTRAST:  None COMPLICATIONS: None immediate. PROCEDURE: RADIATION DOSE REDUCTION: This exam was performed according to the departmental dose-optimization program which includes automated exposure control, adjustment of the mA and/or kV according to patient size and/or use of iterative reconstruction technique. Informed written consent was obtained from the patient and/or patient's representative after a discussion of the risks, benefits and alternatives to treatment. The patient was placed prone on the CT gantry and a pre  procedural CT was performed re-demonstrating the known abscess/fluid collection within the RIGHT deep pelvis. The procedure was planned. A timeout was performed prior to the initiation of the procedure. The RIGHT gluteus was prepped and draped in the usual sterile fashion. The overlying soft tissues were anesthetized with 1% lidocaine with epinephrine. Appropriate trajectory was planned with the use of a 22 gauge spinal needle. An 18 gauge trocar needle was advanced into the abscess/fluid collection and a short Amplatz super stiff wire was coiled within the collection. Appropriate positioning was confirmed with a limited CT scan. The tract was serially dilated allowing placement of a 10 Fr drainage catheter. Appropriate positioning was confirmed with a limited postprocedural CT scan. 25 mL of purulent fluid was aspirated. The tube was connected to a bulb suction and sutured in place. A dressing was placed. The patient tolerated the procedure well without immediate post procedural complication. IMPRESSION: Successful placement of a 10 Fr drainage catheter into a RIGHT pelvic abscess via transgluteal approach, with aspiration of 25 mL of purulent fluid. Samples were sent to the laboratory as requested by the ordering clinical team. Michaelle Birks, MD [facility] Radiology Electronically Signed   By: Michaelle Birks M.D.   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 with a pertinent PMH of type 2 diabetes and paraplegia, who presented with worsenign scrotal infection and admitted for fournier gangrene while on an SGLT-2i. He is status post 2 debridements and IR guided drain placement of pelvic abscess.   Fournier gangrene s/p debridement x2 Right  seminal vesicle abscess s/p IR drain placement Drain placed to right seminal vesical abscess yesterday, was without complications. He is without pain and foley catheter remains in place. Leukocytosis has resolved and he has remained afebrile. Will continue piperacillin/tazobactam and if cultures of abscess susceptible to oral agent will consider oral antibiotics, otherwise will need PICC placement. Will need extended course of abx regardless and likely extended rehabilitation and assistance with wound care. Also plan to consult plastic surgery team this upcoming week. -appreciate surgical team and ID assistance in his care. Consult plastics early next week -Pip/tazo day 2, total abx course day 9 -pain management with tylenol, oxycodone, and dilaudid PRN -follow abscess susceptibilities -daily wet-to-dry dressings -diet supplements given  Electrolyte derangements Likely secondary to poor PO intake. Phosphorus borderline normal and potassium of 3.1. Have encouraged family to bring food patient enjoys, but limit high carb foods. -supplement with potassium phosphate -repeat electrolytes tomorrow, once stable space out lab draws  Insulin dependent  type 2 diabetes mellitus CBG within goal, post prandial glucose levels resolve nicely after novolog.  -novolog SSI  Hospital delirium Patient pleasantly confused and able to be reoriented. Delirium precautions and will limit sedating medications. Encouraged family to be present when able.   Chronic medical conditions Bladder spasms - continue dantrolene Depression - continue zoloft,  HLD - continue simvastatin  Diet: Carb-Modified IVF: None,None VTE: Enoxaparin Code: Full PT/OT recs: SNF for Subacute PT, TOC recs: None Family Update: Spoke with Ms. Bennett and updated on recent IR procedure.   Dispo: Anticipated discharge to Skilled nursing facility in 4-5 days after further evaluation by plastic surgery   [facility] Resident PGY-3 Please contact the on call pager after 5 pm and on weekends at [phone].

## 2022-01-13 NOTE — Plan of Care (Signed)

## 2022-01-13 NOTE — Progress Notes (Signed)
Patient ID: Timothy Davenport, male   DOB: 01/22/1951, 71 y.o.   MRN: [id number] [facility] Progress Note:   4 Days Post-Op   THE PLAN  Observation and wound management Pelvic drainage abscess yesterday  Subjective: Mental status is clear.  Complaints none-comfortable. Objective: Vital signs in last 24 hours: Temp:  [98 F (36.7 C)-98.6 F (37 C)] 98.6 F (37 C) (11/25 0821) Pulse Rate:  [69-113] 69 (11/25 0821) Resp:  [14-22] 16 (11/25 0821) BP: (100-133)/(77-95) 131/95 (11/25 0821) SpO2:  [1 %-100 %] 100 % (11/25 0821)  Intake/Output from previous day: 11/24 0701 - 11/25 0700 In: 839.9 [IV Piggyback:89.9] Out: 800 [Urine:750; Drains:50] Intake/Output this shift: No intake/output data recorded.  Physical Exam: Work of breathing is OK.  Drained abscess;  Perineal wounds dressed  Lab Results:  Results for orders placed or performed during the hospital encounter of 01/05/22 (from the past 48 hour(s))  Glucose, capillary     Status: Abnormal   Collection Time: 01/11/22 12:08 PM  Result Value Ref Range   Glucose-Capillary 143 (H) 70 - 99 mg/dL    Comment: Glucose reference range applies only to samples taken after fasting for at least 8 hours.  Glucose, capillary     Status: Abnormal   Collection Time: 01/11/22  4:09 PM  Result Value Ref Range   Glucose-Capillary 107 (H) 70 - 99 mg/dL    Comment: Glucose reference range applies only to samples taken after fasting for at least 8 hours.  Glucose, capillary     Status: Abnormal   Collection Time: 01/11/22  7:57 PM  Result Value Ref Range   Glucose-Capillary 174 (H) 70 - 99 mg/dL    Comment: Glucose reference range applies only to samples taken after fasting for at least 8 hours.  Phosphorus     Status: Abnormal   Collection Time: 01/12/22  7:03 AM  Result Value Ref Range   Phosphorus 1.8 (L) 2.5 - 4.6 mg/dL    Comment: Performed at [facility] [address], [place], [place] [address]  Basic metabolic  panel     Status: Abnormal   Collection Time: 01/12/22  7:03 AM  Result Value Ref Range   Sodium 142 135 - 145 mmol/L   Potassium 2.9 (L) 3.5 - 5.1 mmol/L   Chloride 103 98 - 111 mmol/L   CO2 24 22 - 32 mmol/L   Glucose, Bld 140 (H) 70 - 99 mg/dL    Comment: Glucose reference range applies only to samples taken after fasting for at least 8 hours.   BUN 10 8 - 23 mg/dL   Creatinine, Ser 0.91 0.61 - 1.24 mg/dL   Calcium 9.4 8.9 - 10.3 mg/dL   GFR, Estimated >60 >60 mL/min    Comment: (NOTE) Calculated using the CKD-EPI Creatinine Equation (2021)    Anion gap 15 5 - 15    Comment: Performed at [facility] [address], [place], [place] [address]  Magnesium     Status: None   Collection Time: 01/12/22  7:03 AM  Result Value Ref Range   Magnesium 2.0 1.7 - 2.4 mg/dL    Comment: Performed at [facility] [address], [place], [name] [address]  Glucose, capillary     Status: Abnormal   Collection Time: 01/12/22  8:55 AM  Result Value Ref Range   Glucose-Capillary 243 (H) 70 - 99 mg/dL    Comment: Glucose reference range applies only to samples taken after  fasting for at least 8 hours.  Glucose, capillary     Status: Abnormal   Collection Time: 01/12/22 12:09 PM  Result Value Ref Range   Glucose-Capillary 171 (H) 70 - 99 mg/dL    Comment: Glucose reference range applies only to samples taken after fasting for at least 8 hours.  Aerobic/Anaerobic Culture w Gram Stain (surgical/deep wound)     Status: None (Preliminary result)   Collection Time: 01/12/22  2:38 PM   Specimen: Abscess  Result Value Ref Range   Specimen Description ABSCESS    Special Requests PELVIC ASPIRATE    Gram Stain      ABUNDANT WBC PRESENT, PREDOMINANTLY PMN RARE GRAM NEGATIVE RODS Performed at [facility], [place] [address], [place], [place] [address]    Culture PENDING    Report Status PENDING   Glucose, capillary     Status: Abnormal   Collection Time: 01/12/22  3:49 PM  Result  Value Ref Range   Glucose-Capillary 221 (H) 70 - 99 mg/dL    Comment: Glucose reference range applies only to samples taken after fasting for at least 8 hours.  Glucose, capillary     Status: Abnormal   Collection Time: 01/12/22  9:10 PM  Result Value Ref Range   Glucose-Capillary 160 (H) 70 - 99 mg/dL    Comment: Glucose reference range applies only to samples taken after fasting for at least 8 hours.  Basic metabolic panel     Status: Abnormal   Collection Time: 01/13/22  2:33 AM  Result Value Ref Range   Sodium 140 135 - 145 mmol/L   Potassium 3.1 (L) 3.5 - 5.1 mmol/L   Chloride 103 98 - 111 mmol/L   CO2 29 22 - 32 mmol/L   Glucose, Bld 139 (H) 70 - 99 mg/dL    Comment: Glucose reference range applies only to samples taken after fasting for at least 8 hours.   BUN 9 8 - 23 mg/dL   Creatinine, Ser 0.90 0.61 - 1.24 mg/dL   Calcium 9.3 8.9 - 10.3 mg/dL   GFR, Estimated >60 >60 mL/min    Comment: (NOTE) Calculated using the CKD-EPI Creatinine Equation (2021)    Anion gap 8 5 - 15    Comment: Performed at [facility] [address], [place], [place] [address]  Phosphorus     Status: None   Collection Time: 01/13/22  2:33 AM  Result Value Ref Range   Phosphorus 2.6 2.5 - 4.6 mg/dL    Comment: Performed at [facility], [place] [address], [place], [place] [address]  Glucose, capillary     Status: Abnormal   Collection Time: 01/13/22  8:23 AM  Result Value Ref Range   Glucose-Capillary 117 (H) 70 - 99 mg/dL    Comment: Glucose reference range applies only to samples taken after fasting for at least 8 hours.    Radiology/Results: CT GUIDED VISCERAL FLUID DRAIN BY PERC CATH  Result Date: 01/12/2022 INDICATION: Pelvic abscess. EXAM: CT-GUIDED RIGHT PELVIC ABSCESS DRAIN PLACEMENT COMPARISON:  CT AP, 01/05/2022. CT pelvis, 01/08/2022 and 01/12/2022. MEDICATIONS: 50 mg Benadryl IV. The patient is currently admitted to the hospital and receiving intravenous antibiotics. The  antibiotics were administered within an appropriate time frame prior to the initiation of the procedure. ANESTHESIA/SEDATION: Local anesthetic and single agent sedation was employed during this procedure. A total of fentanyl 100 mcg was administered intravenously. The patient's level of consciousness and vital signs were monitored continuously by radiology  nursing throughout the procedure under my direct supervision. CONTRAST:  None COMPLICATIONS: None immediate. PROCEDURE: RADIATION DOSE REDUCTION: This exam was performed according to the departmental dose-optimization program which includes automated exposure control, adjustment of the mA and/or kV according to patient size and/or use of iterative reconstruction technique. Informed written consent was obtained from the patient and/or patient's representative after a discussion of the risks, benefits and alternatives to treatment. The patient was placed prone on the CT gantry and a pre procedural CT was performed re-demonstrating the known abscess/fluid collection within the RIGHT deep pelvis. The procedure was planned. A timeout was performed prior to the initiation of the procedure. The RIGHT gluteus was prepped and draped in the usual sterile fashion. The overlying soft tissues were anesthetized with 1% lidocaine with epinephrine. Appropriate trajectory was planned with the use of a 22 gauge spinal needle. An 18 gauge trocar needle was advanced into the abscess/fluid collection and a short Amplatz super stiff wire was coiled within the collection. Appropriate positioning was confirmed with a limited CT scan. The tract was serially dilated allowing placement of a 10 Fr drainage catheter. Appropriate positioning was confirmed with a limited postprocedural CT scan. 25 mL of purulent fluid was aspirated. The tube was connected to a bulb suction and sutured in place. A dressing was placed. The patient tolerated the procedure well without immediate post procedural  complication. IMPRESSION: Successful placement of a 10 Fr drainage catheter into a RIGHT pelvic abscess via transgluteal approach, with aspiration of 25 mL of purulent fluid. Samples were sent to the laboratory as requested by the ordering clinical team. Michaelle Birks, MD [facility] Radiology Electronically Signed   By: Michaelle Birks M.D.   On: 01/12/2022 15:39   CT PELVIS W CONTRAST  Result Date: 01/12/2022 CLINICAL DATA:  Pelvic abscess. EXAM: CT PELVIS WITH CONTRAST TECHNIQUE: Multidetector CT imaging of the pelvis was performed using the standard protocol following the bolus administration of intravenous contrast. RADIATION DOSE REDUCTION: This exam was performed according to the departmental dose-optimization program which includes automated exposure control, adjustment of the mA and/or kV according to patient size and/or use of iterative reconstruction technique. CONTRAST:  55m OMNIPAQUE IOHEXOL 350 MG/ML SOLN COMPARISON:  01/08/2022 FINDINGS: Urinary Tract: Distal ureters are unremarkable. Urinary bladder is decompressed by a Foley catheter. Bowel: Normal appearance of the appendix. Normal appearance of large bowel loops. Normal appearance of small bowel loops. Adjacent to the sigmoid colon and RIGHT seminal vesicle, there is a rim enhancing fluid collection measuring 4.1 x 4.0 centimeters, previously 3.6 x 3.7 centimeters at the same level. No new fluid collections. Vascular/Ly Mphatic: There is dense atherosclerotic calcification of the abdominal aorta and its branches. No retroperitoneal or mesenteric adenopathy. Reproductive: Prostate gland unremarkable. Abscess collection adjacent to RIGHT seminal vesicle, described above. Other: Large surgical defect involving the LOWER anterior abdominal wall and perineum, LEFT greater than RIGHT. The appearance is similar to prior study. Musculoskeletal: There is a nonspecific 1.1 centimeter low-attenuation lesion  within the posterior RIGHT ilium. IMPRESSION: 1. Slight increase in size of abscess collection adjacent to the sigmoid colon and RIGHT seminal vesicle, now measuring 4.1 x 4.0 centimeters, previously 3.6 x 3.7 centimeters. No new fluid collections. 2. Similar large surgical defect involving the LOWER anterior abdominal wall and perineum. 3. Nonspecific 1.1 centimeter low-attenuation lesion within the posterior RIGHT ilium. 4.  Aortic Atherosclerosis (ICD10-I70.0). Electronically Signed   By: ENolon NationsM.D.   On: 01/12/2022 09:01  Anti-infectives: Anti-infectives (From admission, onward)    Start     Dose/Rate Route Frequency Ordered Stop   01/12/22 1230  piperacillin-tazobactam (ZOSYN) IVPB 3.375 g        3.375 g 12.5 mL/hr over 240 Minutes Intravenous Every 8 hours 01/12/22 1139     01/11/22 1400  ceFAZolin (ANCEF) IVPB 2g/100 mL premix  Status:  Discontinued        2 g 200 mL/hr over 30 Minutes Intravenous Every 8 hours 01/10/22 1330 01/12/22 1007   01/08/22 1400  cefTRIAXone (ROCEPHIN) 2 g in sodium chloride 0.9 % 100 mL IVPB  Status:  Discontinued        2 g 200 mL/hr over 30 Minutes Intravenous Every 24 hours 01/08/22 1250 01/10/22 1330   01/07/22 1500  piperacillin-tazobactam (ZOSYN) IVPB 3.375 g  Status:  Discontinued        3.375 g 12.5 mL/hr over 240 Minutes Intravenous Every 8 hours 01/07/22 1357 01/08/22 1244   01/06/22 1100  vancomycin (VANCOCIN) IVPB 1000 mg/200 mL premix  Status:  Discontinued        1,000 mg 200 mL/hr over 60 Minutes Intravenous Every 24 hours 01/05/22 0946 01/05/22 0949   01/06/22 0000  vancomycin variable dose per unstable renal function (pharmacist dosing)  Status:  Discontinued         Does not apply See admin instructions 01/05/22 0949 01/05/22 1514   01/05/22 2200  ceFEPIme (MAXIPIME) 2 g in sodium chloride 0.9 % 100 mL IVPB  Status:  Discontinued        2 g 200 mL/hr over 30 Minutes Intravenous Every 12 hours 01/05/22 0946 01/05/22 1514    01/05/22 2000  piperacillin-tazobactam (ZOSYN) IVPB 3.375 g  Status:  Discontinued        3.375 g 12.5 mL/hr over 240 Minutes Intravenous Every 8 hours 01/05/22 1711 01/07/22 1250   01/05/22 1515  linezolid (ZYVOX) IVPB 600 mg  Status:  Discontinued        600 mg 300 mL/hr over 60 Minutes Intravenous Every 12 hours 01/05/22 1514 01/08/22 1244   01/05/22 0945  ceFEPIme (MAXIPIME) 2 g in sodium chloride 0.9 % 100 mL IVPB        2 g 200 mL/hr over 30 Minutes Intravenous  Once 01/05/22 0932 01/05/22 1038   01/05/22 0945  metroNIDAZOLE (FLAGYL) IVPB 500 mg        500 mg 100 mL/hr over 60 Minutes Intravenous  Once 01/05/22 0932 01/05/22 1103   01/05/22 0945  vancomycin (VANCOCIN) IVPB 1000 mg/200 mL premix  Status:  Discontinued        1,000 mg 200 mL/hr over 60 Minutes Intravenous  Once 01/05/22 0932 01/05/22 0944   01/05/22 0945  vancomycin (VANCOREADY) IVPB 2000 mg/400 mL        2,000 mg 200 mL/hr over 120 Minutes Intravenous  Once 01/05/22 0944 01/05/22 1256       Assessment/Plan: Problem List: Patient Active Problem List   Diagnosis Date Noted   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   Malnutrition of moderate degree 04/25/2021   Liver lesion    Urinary tract infection with hematuria    Lower GI bleed 04/24/2021   Anemia 04/24/2021   Essential hypertension 04/24/2021   Diabetes mellitus type 2 in nonobese ([place]) 04/24/2021   Acute GI bleeding 04/24/2021   Erectile dysfunction due to diseases classified elsewhere 03/02/2020   Wheelchair dependence 11/27/2019   Spasticity 08/03/2019   Paraplegia ([place]) 07/07/2019   Neurogenic bowel 07/07/2019  Neurogenic bladder 07/07/2019   Thoracic myelopathy 06/30/2019    Continue present therapy 4 Days Post-Op    LOS: 8 days   Matt B. Hassell Done, MD, [facility] [phone] to reach the surgeon on call.    01/13/2022 10:35 AM

## 2022-01-13 NOTE — Progress Notes (Addendum)
[facility]  Date of Admission:  01/05/2022     Abx: 11/19-20; 11/24-c piptazo   11/23-24 cefazolin 11/20-22 ceftriaxone 11/17-19 cefepime 11/17-20 linezolid                                                           11/15-17 cefadroxil   Assessment: 71 yo male with dm2 on insulin, t8 paraplegia (fall), neurogenic bladder intermittent straight cath, ckd3, hx cva, admitted 11/17 for progressive 2 weeks of perineal/left scrotal cellulitis found to have Fournier's gangrene   He was seen just prior to admission on 11/15 in ed and treated for presumed uti/balanitis with abx   11/17 bcx ngtd 11/17 ucx insignificant growth 11/17 operative cx rare citrobacter koseri (not an amp-c organism)   He underwent initial I&D by general surgery & urology 11/17 -- extension of necrosis in scrotum/perineum extending into left inguinal canal   Due to polymicrobial nature of infection, despite only citrobacter isolated, will keep on piptazo for now until all surgery/drainage is done and will transition to PO abx to finish course of treatment. He also had received abx prior to surgery   No mrsa is found which usually/readily grow on culture if a player    There is a repeat ct 11/24 of the pelvis that showed slightly enlarging abscess pending ir placement of percutaneous drain  --------- 11/25 assessment Patient is s/p ir drain placement into the right pelvis abscess; cx in process He clinically feels well; minimal pain perineal area. No further surgery planned at this time per urology/general surgery, however, a skin graft/flap coverage is desired and talk of transferring to tertiary care center  He is tolerating iv abx    Plan: Continue piptazo F/u drain cx Potential transition to oral abx Defer picc for now Discussed with primary team     I spent more than 35 minute reviewing data/chart, and coordinating care and >50% direct face to face time  providing counseling/discussing diagnostics/treatment plan with patient   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 Active Problems:   Paraplegia ([place])   Neurogenic bladder   Spasticity   Diabetes mellitus type 2 in nonobese (HCC)   Allergies  Allergen Reactions   Jardiance [Empagliflozin] Other (See Comments)    Fournier Gangrene 2023   Lisinopril Other (See Comments)    Angioedema   Enalapril-Hctz [Enalapril-Hydrochlorothiazide] Rash    Scheduled Meds:  Chlorhexidine Gluconate Cloth  6 each Topical Daily   dantrolene  25 mg Oral TID   enoxaparin (LOVENOX) injection  40 mg Subcutaneous Daily   feeding supplement  237 mL Oral BID BM   insulin aspart  0-9 Units Subcutaneous TID WC   lidocaine  10 mL Intradermal Once   multivitamin with minerals  1 tablet Oral Daily   oxybutynin  7.5 mg Oral BID   polyethylene glycol  17 g Oral Daily   sertraline  100 mg Oral Daily   simvastatin  20 mg Oral q1800   sodium chloride flush  5 mL Intracatheter Q8H   Continuous Infusions:  lactated ringers     And   lactated ringers     piperacillin-tazobactam (ZOSYN)  IV 3.375 g (01/13/22 0624)   potassium PHOSPHATE IVPB (in mmol) 15 mmol (01/13/22 1201)  PRN Meds:.acetaminophen, HYDROmorphone (DILAUDID) injection, oxyCODONE, phenol   SUBJECTIVE: Feels well Minimal pain Afebrile No n/v/diarrhea  Surgery planning flap/graft for wound cover and potentially patient might need terteriary care center   Review of Systems: ROS All other ROS was negative, except mentioned above     OBJECTIVE: Vitals:   01/12/22 1500 01/12/22 2111 01/13/22 0409 01/13/22 0821  BP: 124/86 124/87 (!) 133/90 (!) 131/95  Pulse: 93 76 71 69  Resp: '16  16 16  '$ Temp: 98.1 F (36.7 C) 98 F (36.7 C) 98 F (36.7 C) 98.6 F (37 C)  TempSrc: Oral  Oral Oral  SpO2: 98% 99% 100% 100%  Weight:      Height:       Body mass index is 23.71 kg/m.  Physical Exam General/constitutional: no  distress, pleasant HEENT: Normocephalic, PER, Conj Clear, EOMI, Oropharynx clear Neck supple CV: rrr no mrg Lungs: clear to auscultation, normal respiratory effort Abd: Soft, Nontender Ext: no edema Skin: No Rash Neuro: nonfocal Gu: perineal dressing in place no strike through; right abd drain with serosanguinous fluid   Lab Results Lab Results  Component Value Date   WBC 9.8 01/10/2022   HGB 8.9 (L) 01/10/2022   HCT 27.4 (L) 01/10/2022   MCV 77.8 (L) 01/10/2022   PLT 191 01/10/2022    Lab Results  Component Value Date   CREATININE 0.90 01/13/2022   BUN 9 01/13/2022   NA 140 01/13/2022   K 3.1 (L) 01/13/2022   CL 103 01/13/2022   CO2 29 01/13/2022    Lab Results  Component Value Date   ALT 12 01/05/2022   AST 16 01/05/2022   ALKPHOS 71 01/05/2022   BILITOT 0.6 01/05/2022      Microbiology: Recent Results (from the past 240 hour(s))  Urine Culture     Status: Abnormal   Collection Time: 01/03/22  7:20 PM   Specimen: Urine, Catheterized  Result Value Ref Range Status   Specimen Description URINE, CATHETERIZED  Final   Special Requests   Final    NONE Performed at [facility], [address], [place], [place] [address]    Culture MULTIPLE SPECIES PRESENT, SUGGEST RECOLLECTION (A)  Final   Report Status 01/05/2022 FINAL  Final  Blood culture (routine x 2)     Status: None   Collection Time: 01/05/22  8:36 AM   Specimen: BLOOD  Result Value Ref Range Status   Specimen Description BLOOD RIGHT ANTECUBITAL  Final   Special Requests   Final    BOTTLES DRAWN AEROBIC AND ANAEROBIC Blood Culture results may not be optimal due to an inadequate volume of blood received in culture bottles   Culture   Final    NO GROWTH 5 DAYS Performed at [facility], [place] [address] [place], [place] [address]    Report Status 01/10/2022 FINAL  Final  Urine Culture     Status: Abnormal   Collection Time: 01/05/22  8:36 AM   Specimen: Urine, Clean Catch  Result Value  Ref Range Status   Specimen Description URINE, CLEAN CATCH  Final   Special Requests NONE  Final   Culture (A)  Final    <10,000 COLONIES/mL INSIGNIFICANT GROWTH Performed at [facility], [place] [address] [place], [place] [address]    Report Status 01/06/2022 FINAL  Final  Aerobic/Anaerobic Culture w Gram Stain (surgical/deep wound)     Status: None   Collection Time: 01/05/22  3:00 PM   Specimen: Other  Source; Body Fluid  Result Value Ref Range Status   Specimen Description WOUND SCROTUM  Final   Special Requests NONE  Final   Gram Stain   Final    ABUNDANT WBC PRESENT, PREDOMINANTLY PMN MODERATE GRAM NEGATIVE RODS RARE GRAM POSITIVE RODS    Culture   Final    RARE CITROBACTER KOSERI NO ANAEROBES ISOLATED Performed at [facility], [address] [place], [place] [address]    Report Status 01/10/2022 FINAL  Final   Organism ID, Bacteria CITROBACTER KOSERI  Final      Susceptibility   Citrobacter koseri - MIC*    CEFAZOLIN <=4 SENSITIVE Sensitive     CEFEPIME <=0.12 SENSITIVE Sensitive     CEFTAZIDIME <=1 SENSITIVE Sensitive     CEFTRIAXONE <=0.25 SENSITIVE Sensitive     CIPROFLOXACIN <=0.25 SENSITIVE Sensitive     GENTAMICIN <=1 SENSITIVE Sensitive     IMIPENEM <=0.25 SENSITIVE Sensitive     TRIMETH/SULFA <=20 SENSITIVE Sensitive     PIP/TAZO <=4 SENSITIVE Sensitive     * RARE CITROBACTER KOSERI  Blood culture (routine x 2)     Status: None   Collection Time: 01/05/22  6:18 PM   Specimen: BLOOD RIGHT HAND  Result Value Ref Range Status   Specimen Description BLOOD RIGHT HAND  Final   Special Requests   Final    BOTTLES DRAWN AEROBIC AND ANAEROBIC Blood Culture results may not be optimal due to an excessive volume of blood received in culture bottles   Culture   Final    NO GROWTH 5 DAYS Performed at [facility], [place] [address] [place], [place] [address]    Report Status 01/10/2022 FINAL  Final  Aerobic/Anaerobic Culture w Gram Stain  (surgical/deep wound)     Status: None (Preliminary result)   Collection Time: 01/12/22  2:38 PM   Specimen: Abscess  Result Value Ref Range Status   Specimen Description ABSCESS  Final   Special Requests PELVIC ASPIRATE  Final   Gram Stain   Final    ABUNDANT WBC PRESENT, PREDOMINANTLY PMN RARE GRAM NEGATIVE RODS    Culture   Final    CULTURE REINCUBATED FOR BETTER GROWTH Performed at [facility], [place] [address], [place], [place] [address]    Report Status PENDING  Incomplete     Serology:   Imaging: If present, new imagings (plain films, ct scans, and mri) have been personally visualized and interpreted; radiology reports have been reviewed. Decision making incorporated into the Impression / Recommendations.   Jabier Mutton, [facility] for Infectious [place] [phone] pager    01/13/2022, 12:42 PM

## 2022-01-13 NOTE — Progress Notes (Signed)
Referring Physician(s): Dr. Evette Doffing   Supervising Physician: Markus Daft  Patient Status:  [facility] - In-pt  Chief Complaint: Fournier's gangrene status post surgical debridement. Post-procedure pelvic abscess now with drain placed in IR 01/12/22 by Dr. Maryelizabeth Kaufmann  Subjective: Patient resting in bed. He denies pain/discomfort.    Allergies: Jardiance [empagliflozin], Lisinopril, and Enalapril-hctz [enalapril-hydrochlorothiazide]  Medications: Prior to Admission medications   Medication Sig Start Date End Date Taking? Authorizing Provider  Alogliptin Benzoate 25 MG TABS Take 12.5 tablets by mouth daily with breakfast.   Yes [provider]  Ascorbic Acid 500 MG CAPS Take 500 mg by mouth every Monday, Wednesday, and Friday. 05/03/21  Yes [provider]  aspirin EC 81 MG tablet Take 1 tablet (81 mg total) by mouth daily. Swallow whole. 04/30/21  Yes Elgergawy, Silver Huguenin, MD  baclofen (LIORESAL) 20 MG tablet Take 20 mg by mouth every 8 (eight) hours. 01/11/20  Yes [provider]  cefadroxil (DURICEF) 500 MG capsule Take 1 capsule (500 mg total) by mouth 2 (two) times daily. 01/03/22  Yes Prosperi, Christian H, PA-C  Cholecalciferol 100 MCG (4000 UT) TABS Take 4,000 Units by mouth daily. 01/10/21  Yes [provider]  dantrolene (DANTRIUM) 25 MG capsule Take 75 mg by mouth 2 (two) times daily.   Yes [provider]  docusate sodium (COLACE) 50 MG capsule Take 50 mg by mouth daily.   Yes [provider]  empagliflozin (JARDIANCE) 25 MG TABS tablet Take 12.5 mg by mouth daily. 10/20/20  Yes [provider]  ferrous gluconate (FERGON) 324 MG tablet Take 324 mg by mouth every Monday, Wednesday, and Friday. 05/03/21  Yes [provider]  folic acid (FOLVITE) 1 MG tablet Take 1 mg by mouth daily. 12/06/21  Yes [provider]  gabapentin (NEURONTIN) 400 MG capsule Take 800 mg by mouth 3 (three) times daily. 01/28/20  Yes  [provider]  glucose 4 GM chewable tablet Chew 4 tablets by mouth as needed for low blood sugar.   Yes [provider]  insulin glargine-yfgn (SEMGLEE) 100 UNIT/ML Pen Inject 10 Units into the skin at bedtime. 12/06/21  Yes [provider]  losartan (COZAAR) 50 MG tablet Take 75 mg by mouth daily. 12/06/21  Yes [provider]  omeprazole (PRILOSEC) 20 MG capsule Take 20 mg by mouth daily. 07/31/21  Yes [provider]  oxybutynin (DITROPAN) 5 MG tablet Take 5 mg by mouth 2 (two) times daily.   Yes [provider]  pioglitazone (ACTOS) 30 MG tablet Take 30 mg by mouth daily.   Yes [provider]  sertraline (ZOLOFT) 100 MG tablet Take 100 mg by mouth daily.   Yes [provider]  simvastatin (ZOCOR) 20 MG tablet Take 20 mg by mouth daily at 6 PM.   Yes [provider]  acetaminophen (TYLENOL) 325 MG tablet Take 1-2 tablets (325-650 mg total) by mouth every 4 (four) hours as needed for mild pain. Patient not taking: Reported on 01/06/2022 07/20/19   Love, Ivan Anchors, PA-C  bisacodyl (DULCOLAX) 10 MG suppository UNWRAP AND INSERT 1 SUPPOSITORY(10 MG) RECTALLY DAILY AFTER SUPPER Patient not taking: Reported on 01/06/2022 08/21/19   Raulkar, Clide Deutscher, MD  dantrolene (DANTRIUM) 100 MG capsule Take 1 capsule (100 mg total) by mouth 3 (three) times daily. Patient not taking: Reported on 01/06/2022 03/08/21   Courtney Heys, MD  polyvinyl alcohol (LIQUIFILM TEARS) 1.4 % ophthalmic solution Place 1 drop into both eyes 4 (  four) times daily. Patient not taking: Reported on 01/06/2022 07/20/19   Bary Leriche, PA-C     Vital Signs: BP (!) 131/95 (BP Location: Left Arm)   Pulse 69   Temp 98.6 F (37 C) (Oral)   Resp 16   Ht '5\' 11"'$  (1.803 m)   Wt 169 lb 15.6 oz (77.1 kg)   SpO2 100%   BMI 23.71 kg/m   Physical Exam Constitutional:      General: He is not in acute distress.    Appearance: He is not ill-appearing.   Pulmonary:     Effort: Pulmonary effort is normal.  Abdominal:     Comments: Right TG drain to suction. Approximately 20 ml of sanguineous fluid in bulb. No issues with flushing drain per bedside RN. Dressing is clean/dry/intact.   Skin:    General: Skin is warm and dry.     Comments: Entire groin/lower abdominal area covered in dressing.   Neurological:     Mental Status: He is alert and oriented to person, place, and time.     Imaging: CT GUIDED VISCERAL FLUID DRAIN BY [facility] CATH  Result Date: 01/12/2022 INDICATION: Pelvic abscess. EXAM: CT-GUIDED RIGHT PELVIC ABSCESS DRAIN PLACEMENT COMPARISON:  CT AP, 01/05/2022. CT pelvis, 01/08/2022 and 01/12/2022. MEDICATIONS: 50 mg Benadryl IV. The patient is currently admitted to the hospital and receiving intravenous antibiotics. The antibiotics were administered within an appropriate time frame prior to the initiation of the procedure. ANESTHESIA/SEDATION: Local anesthetic and single agent sedation was employed during this procedure. A total of fentanyl 100 mcg was administered intravenously. The patient's level of consciousness and vital signs were monitored continuously by radiology nursing throughout the procedure under my direct supervision. CONTRAST:  None COMPLICATIONS: None immediate. PROCEDURE: RADIATION DOSE REDUCTION: This exam was performed according to the departmental dose-optimization program which includes automated exposure control, adjustment of the mA and/or kV according to patient size and/or use of iterative reconstruction technique. Informed written consent was obtained from the patient and/or patient's representative after a discussion of the risks, benefits and alternatives to treatment. The patient was placed prone on the CT gantry and a pre procedural CT was performed re-demonstrating the known abscess/fluid collection within the RIGHT deep pelvis. The procedure was planned. A timeout was performed prior to the initiation of the  procedure. The RIGHT gluteus was prepped and draped in the usual sterile fashion. The overlying soft tissues were anesthetized with 1% lidocaine with epinephrine. Appropriate trajectory was planned with the use of a 22 gauge spinal needle. An 18 gauge trocar needle was advanced into the abscess/fluid collection and a short Amplatz super stiff wire was coiled within the collection. Appropriate positioning was confirmed with a limited CT scan. The tract was serially dilated allowing placement of a 10 Fr drainage catheter. Appropriate positioning was confirmed with a limited postprocedural CT scan. 25 mL of purulent fluid was aspirated. The tube was connected to a bulb suction and sutured in place. A dressing was placed. The patient tolerated the procedure well without immediate post procedural complication. IMPRESSION: Successful placement of a 10 Fr drainage catheter into a RIGHT pelvic abscess via transgluteal approach, with aspiration of 25 mL of purulent fluid. Samples were sent to the laboratory as requested by the ordering clinical team. Michaelle Birks, MD [facility] Electronically Signed   By: Michaelle Birks M.D.   On: 01/12/2022 15:39   CT PELVIS W CONTRAST  Result Date: 01/12/2022 CLINICAL DATA:  Pelvic abscess.  EXAM: CT PELVIS WITH CONTRAST TECHNIQUE: Multidetector CT imaging of the pelvis was performed using the standard protocol following the bolus administration of intravenous contrast. RADIATION DOSE REDUCTION: This exam was performed according to the departmental dose-optimization program which includes automated exposure control, adjustment of the mA and/or kV according to patient size and/or use of iterative reconstruction technique. CONTRAST:  59m OMNIPAQUE IOHEXOL 350 MG/ML SOLN COMPARISON:  01/08/2022 FINDINGS: Urinary Tract: Distal ureters are unremarkable. Urinary bladder is decompressed by a Foley catheter. Bowel: Normal appearance of  the appendix. Normal appearance of large bowel loops. Normal appearance of small bowel loops. Adjacent to the sigmoid colon and RIGHT seminal vesicle, there is a rim enhancing fluid collection measuring 4.1 x 4.0 centimeters, previously 3.6 x 3.7 centimeters at the same level. No new fluid collections. Vascular/Ly Mphatic: There is dense atherosclerotic calcification of the abdominal aorta and its branches. No retroperitoneal or mesenteric adenopathy. Reproductive: Prostate gland unremarkable. Abscess collection adjacent to RIGHT seminal vesicle, described above. Other: Large surgical defect involving the LOWER anterior abdominal wall and perineum, LEFT greater than RIGHT. The appearance is similar to prior study. Musculoskeletal: There is a nonspecific 1.1 centimeter low-attenuation lesion within the posterior RIGHT ilium. IMPRESSION: 1. Slight increase in size of abscess collection adjacent to the sigmoid colon and RIGHT seminal vesicle, now measuring 4.1 x 4.0 centimeters, previously 3.6 x 3.7 centimeters. No new fluid collections. 2. Similar large surgical defect involving the LOWER anterior abdominal wall and perineum. 3. Nonspecific 1.1 centimeter low-attenuation lesion within the posterior RIGHT ilium. 4.  Aortic Atherosclerosis (ICD10-I70.0). Electronically Signed   By: ENolon NationsM.D.   On: 01/12/2022 09:01    Labs:  CBC: Recent Labs    01/07/22 1405 01/08/22 0333 01/09/22 0234 01/10/22 0355  WBC 15.4* 13.2* 9.8 9.8  HGB 9.8* 8.8* 8.5* 8.9*  HCT 29.3* 26.5* 26.6* 27.4*  PLT 188 162 178 191    COAGS: Recent Labs    04/24/21 1410  INR 1.0    BMP: Recent Labs    01/08/22 0333 01/10/22 0355 01/12/22 0703 01/13/22 0233  NA 140 141 142 140  K 3.7 3.6 2.9* 3.1*  CL 108 103 103 103  CO2 '23 29 24 29  '$ GLUCOSE 150* 115* 140* 139*  BUN '18 13 10 9  '$ CALCIUM 9.8 9.7 9.4 9.3  CREATININE 1.12 0.90 0.91 0.90  GFRNONAA >60 >60 >60 >60    LIVER FUNCTION TESTS: Recent Labs     03/08/21 1137 04/24/21 0921 01/05/22 0900  BILITOT 0.2 0.3 0.6  AST 16 12* 16  ALT '8 11 12  '$ ALKPHOS 82 51 71  PROT 7.9 7.3 7.7  ALBUMIN 3.9 3.5 2.6*    Assessment and Plan:  Fournier's gangrene status post surgical debridement. Post-procedure pelvic abscess now with drain placed in IR 01/12/22 by Dr. MMaryelizabeth Kaufmann Patient is afebrile and without leukocytosis. 20 ml of sanguineous fluid in bulb. Patient denies pain/discomfort.   Drain Location: RLQ Size: Fr size: 10 Fr Date of placement: 01/12/22 Currently to: Drain collection device: suction bulb 24 hour output:  Output by Drain (mL) 01/11/22 0700 - 01/11/22 1459 01/11/22 1500 - 01/11/22 2259 01/11/22 2300 - 01/12/22 0659 01/12/22 0700 - 01/12/22 1459 01/12/22 1500 - 01/12/22 2259 01/12/22 2300 - 01/13/22 0659 01/13/22 0700 - 01/13/22 1026  Closed System Drain 1 Right Buttock Bulb (JP)    20 30      Interval imaging/drain manipulation:  none  Current examination: Insertion site unremarkable. Suture and stat lock  in place. Dressed appropriately.   Plan: Continue TID flushes with 5 cc NS. Record output Q shift. Dressing changes QD or PRN if soiled.  Call IR APP or on call IR MD if difficulty flushing or sudden change in drain output.  Repeat imaging/possible drain injection once output < 10 mL/QD (excluding flush material). Consideration for drain removal if output is < 10 mL/QD (excluding flush material), pending discussion with the providing surgical service.  Discharge planning: Please contact IR APP or on call IR MD prior to patient d/c to ensure appropriate follow up plans are in place. Typically patient will follow up with IR clinic 10-14 days post d/c for repeat imaging/possible drain injection. IR scheduler will contact patient with date/time of appointment. Patient will need to flush drain QD with 5 cc NS, record output QD, dressing changes every 2-3 days or earlier if soiled.   IR will continue to follow - please call  with questions or concerns.  Electronically Signed: Soyla Dryer, AGACNP-BC [phone] 01/13/2022, 10:20 AM   I spent a total of 15 Minutes at the the patient's bedside AND on the patient's hospital floor or unit, greater than 50% of which was counseling/coordinating care for right TG drain.

## 2022-01-13 NOTE — Progress Notes (Signed)
Pt given IV dilaudid pre dressing change. Pt tolerated dressing change.  Wound is red, beefy with no s/s of infection, no foul odor or pus noted. Pt drain flushed with 5 cc ns and resuctioned.  Louanne Skye 01/13/22 6:31 PM

## 2022-01-13 NOTE — Progress Notes (Signed)
4 Days Post-Op Subjective: Patient feeling well.  Underwent placement of percutaneous drain for pelvic abscess yesterday by interventional radiology.  Objective: Vital signs in last 24 hours: Temp:  [98 F (36.7 C)-98.6 F (37 C)] 98.6 F (37 C) (11/25 0821) Pulse Rate:  [69-113] 69 (11/25 0821) Resp:  [14-22] 16 (11/25 0821) BP: (100-133)/(77-95) 131/95 (11/25 0821) SpO2:  [1 %-100 %] 100 % (11/25 0821)  Intake/Output from previous day: 11/24 0701 - 11/25 0700 In: 839.9 [IV Piggyback:89.9] Out: 800 [Urine:750; Drains:50] Intake/Output this shift: No intake/output data recorded.  Physical Exam:  General: Alert and oriented Wound was undressed and the edges appear fresh   Lab Results: No results for input(s): "HGB", "HCT" in the last 72 hours. BMET Recent Labs    01/12/22 0703 01/13/22 0233  NA 142 140  K 2.9* 3.1*  CL 103 103  CO2 24 29  GLUCOSE 140* 139*  BUN 10 9  CREATININE 0.91 0.90  CALCIUM 9.4 9.3     Studies/Results: CT GUIDED VISCERAL FLUID DRAIN BY PERC CATH  Result Date: 01/12/2022 INDICATION: Pelvic abscess. EXAM: CT-GUIDED RIGHT PELVIC ABSCESS DRAIN PLACEMENT COMPARISON:  CT AP, 01/05/2022. CT pelvis, 01/08/2022 and 01/12/2022. MEDICATIONS: 50 mg Benadryl IV. The patient is currently admitted to the hospital and receiving intravenous antibiotics. The antibiotics were administered within an appropriate time frame prior to the initiation of the procedure. ANESTHESIA/SEDATION: Local anesthetic and single agent sedation was employed during this procedure. A total of fentanyl 100 mcg was administered intravenously. The patient's level of consciousness and vital signs were monitored continuously by radiology nursing throughout the procedure under my direct supervision. CONTRAST:  None COMPLICATIONS: None immediate. PROCEDURE: RADIATION DOSE REDUCTION: This exam was performed according to the departmental dose-optimization program which includes automated exposure  control, adjustment of the mA and/or kV according to patient size and/or use of iterative reconstruction technique. Informed written consent was obtained from the patient and/or patient's representative after a discussion of the risks, benefits and alternatives to treatment. The patient was placed prone on the CT gantry and a pre procedural CT was performed re-demonstrating the known abscess/fluid collection within the RIGHT deep pelvis. The procedure was planned. A timeout was performed prior to the initiation of the procedure. The RIGHT gluteus was prepped and draped in the usual sterile fashion. The overlying soft tissues were anesthetized with 1% lidocaine with epinephrine. Appropriate trajectory was planned with the use of a 22 gauge spinal needle. An 18 gauge trocar needle was advanced into the abscess/fluid collection and a short Amplatz super stiff wire was coiled within the collection. Appropriate positioning was confirmed with a limited CT scan. The tract was serially dilated allowing placement of a 10 Fr drainage catheter. Appropriate positioning was confirmed with a limited postprocedural CT scan. 25 mL of purulent fluid was aspirated. The tube was connected to a bulb suction and sutured in place. A dressing was placed. The patient tolerated the procedure well without immediate post procedural complication. IMPRESSION: Successful placement of a 10 Fr drainage catheter into a RIGHT pelvic abscess via transgluteal approach, with aspiration of 25 mL of purulent fluid. Samples were sent to the laboratory as requested by the ordering clinical team. Michaelle Birks, MD [facility] Radiology Electronically Signed   By: Michaelle Birks M.D.   On: 01/12/2022 15:39   CT PELVIS W CONTRAST  Result Date: 01/12/2022 CLINICAL DATA:  Pelvic abscess. EXAM: CT PELVIS WITH CONTRAST TECHNIQUE: Multidetector CT imaging of the pelvis was  performed using the standard protocol  following the bolus administration of intravenous contrast. RADIATION DOSE REDUCTION: This exam was performed according to the departmental dose-optimization program which includes automated exposure control, adjustment of the mA and/or kV according to patient size and/or use of iterative reconstruction technique. CONTRAST:  58m OMNIPAQUE IOHEXOL 350 MG/ML SOLN COMPARISON:  01/08/2022 FINDINGS: Urinary Tract: Distal ureters are unremarkable. Urinary bladder is decompressed by a Foley catheter. Bowel: Normal appearance of the appendix. Normal appearance of large bowel loops. Normal appearance of small bowel loops. Adjacent to the sigmoid colon and RIGHT seminal vesicle, there is a rim enhancing fluid collection measuring 4.1 x 4.0 centimeters, previously 3.6 x 3.7 centimeters at the same level. No new fluid collections. Vascular/Ly Mphatic: There is dense atherosclerotic calcification of the abdominal aorta and its branches. No retroperitoneal or mesenteric adenopathy. Reproductive: Prostate gland unremarkable. Abscess collection adjacent to RIGHT seminal vesicle, described above. Other: Large surgical defect involving the LOWER anterior abdominal wall and perineum, LEFT greater than RIGHT. The appearance is similar to prior study. Musculoskeletal: There is a nonspecific 1.1 centimeter low-attenuation lesion within the posterior RIGHT ilium. IMPRESSION: 1. Slight increase in size of abscess collection adjacent to the sigmoid colon and RIGHT seminal vesicle, now measuring 4.1 x 4.0 centimeters, previously 3.6 x 3.7 centimeters. No new fluid collections. 2. Similar large surgical defect involving the LOWER anterior abdominal wall and perineum. 3. Nonspecific 1.1 centimeter low-attenuation lesion within the posterior RIGHT ilium. 4.  Aortic Atherosclerosis (ICD10-I70.0). Electronically Signed   By: ENolon NationsM.D.   On: 01/12/2022 09:01    Assessment/Plan: Fournier's gangrene status post extensive GU and  abdominal debridement and recent placement now of percutaneous drain in the right pelvic abscess.  Plan/recommendation.  Continue antibiotics await drain culture.  Will ultimately need definitive management for coverage which might need referral to tertiary center.    LOS: 8 days   GWELCOME FULTS11/25/2023, 11:32 AM

## 2022-01-14 DIAGNOSIS — N493 Fournier gangrene: Secondary | ICD-10-CM | POA: Diagnosis not present

## 2022-01-14 LAB — GLUCOSE, CAPILLARY
Glucose-Capillary: 163 mg/dL — ABNORMAL HIGH (ref 70–99)
Glucose-Capillary: 174 mg/dL — ABNORMAL HIGH (ref 70–99)
Glucose-Capillary: 190 mg/dL — ABNORMAL HIGH (ref 70–99)
Glucose-Capillary: 148 mg/dL — ABNORMAL HIGH (ref 70–99)

## 2022-01-14 LAB — BASIC METABOLIC PANEL
Anion gap: 9 (ref 5–15)
BUN: 8 mg/dL (ref 8–23)
CO2: 28 mmol/L (ref 22–32)
Calcium: 9.4 mg/dL (ref 8.9–10.3)
Chloride: 104 mmol/L (ref 98–111)
Creatinine, Ser: 0.99 mg/dL (ref 0.61–1.24)
GFR, Estimated: >60 mL/min (ref >60)
Glucose, Bld: 126 mg/dL — ABNORMAL HIGH (ref 70–99)
Potassium: 3.5 mmol/L (ref 3.5–5.1)
Sodium: 141 mmol/L (ref 135–145)

## 2022-01-14 LAB — MAGNESIUM: Magnesium: 2.1 mg/dL (ref 1.7–2.4)

## 2022-01-14 MED ORDER — POTASSIUM CHLORIDE 20 MEQ PO PACK
60.0000 meq | PACK | Freq: Once | ORAL | Status: AC
  Administered 2022-01-14: 60 meq via ORAL
  Filled 2022-01-14: qty 3

## 2022-01-14 NOTE — Progress Notes (Addendum)
Patient ID: Timothy Davenport, male   DOB: Jul 13, 1950, 71 y.o.   MRN: [id number] [facility] Surgery Progress Note:   5 Days Post-Op   THE PLAN  Continue wet/dry dressing changes Assess pelvic drain for removal-continue Zosyn-cultures and sensitivity pending  Subjective: Mental status is alert.  Complaints feeling much better. Objective: Vital signs in last 24 hours: Temp:  [98.3 F (36.8 C)-99.3 F (37.4 C)] 98.3 F (36.8 C) (11/26 0757) Pulse Rate:  [71-88] 71 (11/26 0757) Resp:  [16-18] 16 (11/26 0757) BP: (113-131)/(80-89) 124/80 (11/26 0757) SpO2:  [100 %] 100 % (11/26 0757)  Intake/Output from previous day: 11/25 0701 - 11/26 0700 In: 135 [P.O.:120] Out: 40 [Drains:40] Intake/Output this shift: No intake/output data recorded.  Physical Exam: Work of breathing is normal-minimal bloody drainage from pelvic drain.    Lab Results:  Results for orders placed or performed during the hospital encounter of 01/05/22 (from the past 48 hour(s))  Glucose, capillary     Status: Abnormal   Collection Time: 01/12/22 12:09 PM  Result Value Ref Range   Glucose-Capillary 171 (H) 70 - 99 mg/dL    Comment: Glucose reference range applies only to samples taken after fasting for at least 8 hours.  Aerobic/Anaerobic Culture w Gram Stain (surgical/deep wound)     Status: None (Preliminary result)   Collection Time: 01/12/22  2:38 PM   Specimen: Abscess  Result Value Ref Range   Specimen Description ABSCESS    Special Requests PELVIC ASPIRATE    Gram Stain      ABUNDANT WBC PRESENT, PREDOMINANTLY PMN RARE GRAM NEGATIVE RODS    Culture      RARE CITROBACTER KOSERI CULTURE REINCUBATED FOR BETTER GROWTH Performed at [facility], [place] [address] [place], [place] [address]    Report Status PENDING   Glucose, capillary     Status: Abnormal   Collection Time: 01/12/22  3:49 PM  Result Value Ref Range   Glucose-Capillary 221 (H) 70 - 99 mg/dL    Comment: Glucose reference range  applies only to samples taken after fasting for at least 8 hours.  Glucose, capillary     Status: Abnormal   Collection Time: 01/12/22  9:10 PM  Result Value Ref Range   Glucose-Capillary 160 (H) 70 - 99 mg/dL    Comment: Glucose reference range applies only to samples taken after fasting for at least 8 hours.  Basic metabolic panel     Status: Abnormal   Collection Time: 01/13/22  2:33 AM  Result Value Ref Range   Sodium 140 135 - 145 mmol/L   Potassium 3.1 (L) 3.5 - 5.1 mmol/L   Chloride 103 98 - 111 mmol/L   CO2 29 22 - 32 mmol/L   Glucose, Bld 139 (H) 70 - 99 mg/dL    Comment: Glucose reference range applies only to samples taken after fasting for at least 8 hours.   BUN 9 8 - 23 mg/dL   Creatinine, Ser 0.90 0.61 - 1.24 mg/dL   Calcium 9.3 8.9 - 10.3 mg/dL   GFR, Estimated >60 >60 mL/min    Comment: (NOTE) Calculated using the CKD-EPI Creatinine Equation (2021)    Anion gap 8 5 - 15    Comment: Performed at [facility] [address] [place], [place] [address]  Phosphorus     Status: None   Collection Time: 01/13/22  2:33 AM  Result Value Ref Range   Phosphorus 2.6 2.5 - 4.6 mg/dL  Comment: Performed at [facility], [place] [address], [place], [place] [address]  Glucose, capillary     Status: Abnormal   Collection Time: 01/13/22  8:23 AM  Result Value Ref Range   Glucose-Capillary 117 (H) 70 - 99 mg/dL    Comment: Glucose reference range applies only to samples taken after fasting for at least 8 hours.  Glucose, capillary     Status: Abnormal   Collection Time: 01/13/22  1:02 PM  Result Value Ref Range   Glucose-Capillary 367 (H) 70 - 99 mg/dL    Comment: Glucose reference range applies only to samples taken after fasting for at least 8 hours.  Glucose, capillary     Status: Abnormal   Collection Time: 01/13/22  6:01 PM  Result Value Ref Range   Glucose-Capillary 128 (H) 70 - 99 mg/dL    Comment: Glucose reference range applies only to samples taken after  fasting for at least 8 hours.  Glucose, capillary     Status: Abnormal   Collection Time: 01/13/22  8:29 PM  Result Value Ref Range   Glucose-Capillary 111 (H) 70 - 99 mg/dL    Comment: Glucose reference range applies only to samples taken after fasting for at least 8 hours.  Glucose, capillary     Status: Abnormal   Collection Time: 01/13/22  8:30 PM  Result Value Ref Range   Glucose-Capillary 127 (H) 70 - 99 mg/dL    Comment: Glucose reference range applies only to samples taken after fasting for at least 8 hours.  Basic metabolic panel     Status: Abnormal   Collection Time: 01/14/22  2:48 AM  Result Value Ref Range   Sodium 141 135 - 145 mmol/L   Potassium 3.5 3.5 - 5.1 mmol/L   Chloride 104 98 - 111 mmol/L   CO2 28 22 - 32 mmol/L   Glucose, Bld 126 (H) 70 - 99 mg/dL    Comment: Glucose reference range applies only to samples taken after fasting for at least 8 hours.   BUN 8 8 - 23 mg/dL   Creatinine, Ser 0.99 0.61 - 1.24 mg/dL   Calcium 9.4 8.9 - 10.3 mg/dL   GFR, Estimated >60 >60 mL/min    Comment: (NOTE) Calculated using the CKD-EPI Creatinine Equation (2021)    Anion gap 9 5 - 15    Comment: Performed at [facility] [address], [place], [place] [address]  Magnesium     Status: None   Collection Time: 01/14/22  2:48 AM  Result Value Ref Range   Magnesium 2.1 1.7 - 2.4 mg/dL    Comment: Performed at [facility] [address] [place], [place] [address]  Glucose, capillary     Status: Abnormal   Collection Time: 01/14/22  8:09 AM  Result Value Ref Range   Glucose-Capillary 163 (H) 70 - 99 mg/dL    Comment: Glucose reference range applies only to samples taken after fasting for at least 8 hours.    Radiology/Results: CT GUIDED VISCERAL FLUID DRAIN BY PERC CATH  Result Date: 01/12/2022 INDICATION: Pelvic abscess. EXAM: CT-GUIDED RIGHT PELVIC ABSCESS DRAIN PLACEMENT COMPARISON:  CT AP, 01/05/2022. CT pelvis, 01/08/2022 and 01/12/2022. MEDICATIONS:  50 mg Benadryl IV. The patient is currently admitted to the hospital and receiving intravenous antibiotics. The antibiotics were administered within an appropriate time frame prior to the initiation of the procedure. ANESTHESIA/SEDATION: Local anesthetic and single agent sedation was employed during this procedure. A total of  fentanyl 100 mcg was administered intravenously. The patient's level of consciousness and vital signs were monitored continuously by radiology nursing throughout the procedure under my direct supervision. CONTRAST:  None COMPLICATIONS: None immediate. PROCEDURE: RADIATION DOSE REDUCTION: This exam was performed according to the departmental dose-optimization program which includes automated exposure control, adjustment of the mA and/or kV according to patient size and/or use of iterative reconstruction technique. Informed written consent was obtained from the patient and/or patient's representative after a discussion of the risks, benefits and alternatives to treatment. The patient was placed prone on the CT gantry and a pre procedural CT was performed re-demonstrating the known abscess/fluid collection within the RIGHT deep pelvis. The procedure was planned. A timeout was performed prior to the initiation of the procedure. The RIGHT gluteus was prepped and draped in the usual sterile fashion. The overlying soft tissues were anesthetized with 1% lidocaine with epinephrine. Appropriate trajectory was planned with the use of a 22 gauge spinal needle. An 18 gauge trocar needle was advanced into the abscess/fluid collection and a short Amplatz super stiff wire was coiled within the collection. Appropriate positioning was confirmed with a limited CT scan. The tract was serially dilated allowing placement of a 10 Fr drainage catheter. Appropriate positioning was confirmed with a limited postprocedural CT scan. 25 mL of purulent fluid was aspirated. The tube was connected to a bulb suction and sutured  in place. A dressing was placed. The patient tolerated the procedure well without immediate post procedural complication. IMPRESSION: Successful placement of a 10 Fr drainage catheter into a RIGHT pelvic abscess via transgluteal approach, with aspiration of 25 mL of purulent fluid. Samples were sent to the laboratory as requested by the ordering clinical team. Michaelle Birks, MD [facility]iology [facility] Radiology Electronically Signed   By: Michaelle Birks M.D.   On: 01/12/2022 15:39    Anti-infectives: Anti-infectives (From admission, onward)    Start     Dose/Rate Route Frequency Ordered Stop   01/12/22 1230  piperacillin-tazobactam (ZOSYN) IVPB 3.375 g        3.375 g 12.5 mL/hr over 240 Minutes Intravenous Every 8 hours 01/12/22 1139     01/11/22 1400  ceFAZolin (ANCEF) IVPB 2g/100 mL premix  Status:  Discontinued        2 g 200 mL/hr over 30 Minutes Intravenous Every 8 hours 01/10/22 1330 01/12/22 1007   01/08/22 1400  cefTRIAXone (ROCEPHIN) 2 g in sodium chloride 0.9 % 100 mL IVPB  Status:  Discontinued        2 g 200 mL/hr over 30 Minutes Intravenous Every 24 hours 01/08/22 1250 01/10/22 1330   01/07/22 1500  piperacillin-tazobactam (ZOSYN) IVPB 3.375 g  Status:  Discontinued        3.375 g 12.5 mL/hr over 240 Minutes Intravenous Every 8 hours 01/07/22 1357 01/08/22 1244   01/06/22 1100  vancomycin (VANCOCIN) IVPB 1000 mg/200 mL premix  Status:  Discontinued        1,000 mg 200 mL/hr over 60 Minutes Intravenous Every 24 hours 01/05/22 0946 01/05/22 0949   01/06/22 0000  vancomycin variable dose per unstable renal function (pharmacist dosing)  Status:  Discontinued         Does not apply See admin instructions 01/05/22 0949 01/05/22 1514   01/05/22 2200  ceFEPIme (MAXIPIME) 2 g in sodium chloride 0.9 % 100 mL IVPB  Status:  Discontinued        2 g 200 mL/hr over 30 Minutes Intravenous Every 12  hours 01/05/22 0946 01/05/22 1514   01/05/22 2000   piperacillin-tazobactam (ZOSYN) IVPB 3.375 g  Status:  Discontinued        3.375 g 12.5 mL/hr over 240 Minutes Intravenous Every 8 hours 01/05/22 1711 01/07/22 1250   01/05/22 1515  linezolid (ZYVOX) IVPB 600 mg  Status:  Discontinued        600 mg 300 mL/hr over 60 Minutes Intravenous Every 12 hours 01/05/22 1514 01/08/22 1244   01/05/22 0945  ceFEPIme (MAXIPIME) 2 g in sodium chloride 0.9 % 100 mL IVPB        2 g 200 mL/hr over 30 Minutes Intravenous  Once 01/05/22 0932 01/05/22 1038   01/05/22 0945  metroNIDAZOLE (FLAGYL) IVPB 500 mg        500 mg 100 mL/hr over 60 Minutes Intravenous  Once 01/05/22 0932 01/05/22 1103   01/05/22 0945  vancomycin (VANCOCIN) IVPB 1000 mg/200 mL premix  Status:  Discontinued        1,000 mg 200 mL/hr over 60 Minutes Intravenous  Once 01/05/22 0932 01/05/22 0944   01/05/22 0945  vancomycin (VANCOREADY) IVPB 2000 mg/400 mL        2,000 mg 200 mL/hr over 120 Minutes Intravenous  Once 01/05/22 0944 01/05/22 1256       Assessment/Plan: Problem List: Patient Active Problem List   Diagnosis Date Noted   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   Malnutrition of moderate degree 04/25/2021   Liver lesion    Urinary tract infection with hematuria    Lower GI bleed 04/24/2021   Anemia 04/24/2021   Essential hypertension 04/24/2021   Diabetes mellitus type 2 in nonobese ([place]) 04/24/2021   Acute GI bleeding 04/24/2021   Erectile dysfunction due to diseases classified elsewhere 03/02/2020   Wheelchair dependence 11/27/2019   Spasticity 08/03/2019   Paraplegia ([place]) 07/07/2019   Neurogenic bowel 07/07/2019   Neurogenic bladder 07/07/2019   Thoracic myelopathy 06/30/2019    Patient feels better after pelvic drainage.  Improved 5 Days Post-Op    LOS: 9 days   Matt B. Hassell Done, MD, [facility] [phone] to reach the surgeon on call.    01/14/2022 9:02 AM

## 2022-01-14 NOTE — Progress Notes (Signed)
Pt given PRN pain medicine prior to wound care performance. Pt tolerated procedure well . No s/s of infection no foul odor. Red beefy edges all through out  Louanne Skye 01/14/22 4:47 PM

## 2022-01-14 NOTE — Progress Notes (Addendum)
HD#9 Subjective:   Summary: Timothy Davenport 71 y.o. male with PMH of T2DM on insulin, paraplegia T8 spinal cord injury, neurogenic bladder, CKD 3, HLD, prior CVA who presents for worsening left scrotal infection, admitted for fournier gangrene s/p 2 debridements and drainage of right pelvic abscess.  Overnight Events: No acute events overnight  Resting in bed comfortably, no family at bedside. Continues to have some delirium, but easily redirectable. He denies any pain or discomfort. Per nursing no issues with foley catheter and continues to have poor PO intake.   Objective:  Vital signs in last 24 hours: Vitals:   01/13/22 0821 01/13/22 1645 01/13/22 2028 01/14/22 0757  BP: (!) 131/95 113/89 131/88 124/80  Pulse: 69 88 79 71  Resp: '16 16 18 16  '$ Temp: 98.6 F (37 C) 99.3 F (37.4 C) 98.8 F (37.1 C) 98.3 F (36.8 C)  TempSrc: Oral Oral Oral Oral  SpO2: 100% 100% 100% 100%  Weight:      Height:       Supplemental O2: room air  Physical Exam:  Constitutional: no acute distress HENT: normocephalic atraumatic Eyes: conjunctiva non-erythematous Neck: supple Cardiovascular: regular rate and rhythm, no m/r/g Pulmonary/Chest: normal work of breathing on room air Abdominal: soft, non-tender, non-distended. Bandages in place.  MSK: normal bulk and tone. Right sided pelvic drain in place with scant serosanguineous fluid.  Neurological: alert & oriented to person and place when reoriented.  Skin: warm and dry Psych: pleasant  Filed Weights   01/05/22 0835 01/05/22 1145 01/09/22 0953  Weight: 81.6 kg 77.1 kg 77.1 kg    Intake/Output Summary (Last 24 hours) at 01/14/2022 0852 Last data filed at 01/14/2022 0421 Gross per 24 hour  Intake 135 ml  Output 40 ml  Net 95 ml    Net IO Since Admission: 2,108.59 mL [01/14/22 0852]  Pertinent Labs:    Latest Ref Rng & Units 01/10/2022    3:55 AM 01/09/2022    2:34 AM 01/08/2022    3:33 AM  CBC  WBC 4.0 - 10.5 K/uL 9.8  9.8   13.2   Hemoglobin 13.0 - 17.0 g/dL 8.9  8.5  8.8   Hematocrit 39.0 - 52.0 % 27.4  26.6  26.5   Platelets 150 - 400 K/uL 191  178  162        Latest Ref Rng & Units 01/14/2022    2:48 AM 01/13/2022    2:33 AM 01/12/2022    7:03 AM  CMP  Glucose 70 - 99 mg/dL 126  139  140   BUN 8 - 23 mg/dL '8  9  10   '$ Creatinine 0.61 - 1.24 mg/dL 0.99  0.90  0.91   Sodium 135 - 145 mmol/L 141  140  142   Potassium 3.5 - 5.1 mmol/L 3.5  3.1  2.9   Chloride 98 - 111 mmol/L 104  103  103   CO2 22 - 32 mmol/L '28  29  24   '$ Calcium 8.9 - 10.3 mg/dL 9.4  9.3  9.4     Assessment/Plan:   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 with a pertinent PMH of type 2 diabetes and paraplegia, who presented with worsenign scrotal infection and admitted for fournier gangrene while on an SGLT-2i. He is status post  2 debridements and IR guided drain placement of pelvic abscess.   Fournier gangrene s/p debridement x2 Right seminal vesicle abscess s/p IR drain placement Continues to do well, minimal serosanguinous drainage from pelvic abscess drain. Bandages in place. He is without pain and foley catheter remains in place. Continue piperacillin/tazobactam and if cultures of abscess susceptible to oral agent, will not need PICC/IV abx. Plan to consult plastic surgery tomorrow -appreciate surgical team and ID assistance in his care. Consult plastics tomorrow -Pip/tazo day 3, total abx course day 10 -pain management with tylenol, oxycodone, and dilaudid PRN -follow abscess susceptibilities -daily wet-to-dry dressings -diet supplements given -encourage nutrition  Electrolyte derangements Likely secondary to poor PO intake. 60 meq potassium packet ordered today. Will check labs every other day and replete as needed. Will encourage family to  come by and assist with nutrition, if he continues to struggle with nutrition will delay wound healing significantly.   Insulin dependent type 2 diabetes mellitus CBG within goal -[facility] delirium Patient pleasantly confused and able to be reoriented. Encouraged family to be present when able.  -delirium precautions, avoid centrally acting medications  Chronic medical conditions Bladder spasms - continue dantrolene Depression - continue zoloft,  HLD - continue simvastatin  Diet: Carb-Modified IVF: None,None VTE: Enoxaparin Code: Full PT/OT recs: SNF for Subacute PT, TOC recs: None Family Update: Spoke with Ms. Steuck yesterday. No family at bedside this am. Will update tomorrow 11/27  Dispo: Anticipated discharge to Skilled nursing facility in 4-5 days after further evaluation by plastic surgery   [facility] Resident PGY-3 Please contact the on call pager after 5 pm and on weekends at [phone].

## 2022-01-14 NOTE — Plan of Care (Signed)
  Problem: Health Behavior/Discharge Planning: Goal: Ability to manage health-related needs will improve Outcome: Not Progressing   Problem: Tissue Perfusion: Goal: Adequacy of tissue perfusion will improve Outcome: Progressing

## 2022-01-15 ENCOUNTER — Encounter: Payer: No Typology Code available for payment source | Admitting: Physical Medicine and Rehabilitation

## 2022-01-15 ENCOUNTER — Inpatient Hospital Stay (HOSPITAL_COMMUNITY): Payer: Medicare Other

## 2022-01-15 DIAGNOSIS — N493 Fournier gangrene: Secondary | ICD-10-CM | POA: Diagnosis not present

## 2022-01-15 LAB — GLUCOSE, CAPILLARY
Glucose-Capillary: 155 mg/dL — ABNORMAL HIGH (ref 70–99)
Glucose-Capillary: 164 mg/dL — ABNORMAL HIGH (ref 70–99)
Glucose-Capillary: 118 mg/dL — ABNORMAL HIGH (ref 70–99)

## 2022-01-15 MED ORDER — METRONIDAZOLE 500 MG PO TABS
500.0000 mg | ORAL_TABLET | Freq: Two times a day (BID) | ORAL | Status: DC
  Administered 2022-01-15 – 2022-01-24 (×17): 500 mg via ORAL
  Filled 2022-01-15 (×17): qty 1

## 2022-01-15 MED ORDER — CEFADROXIL 500 MG PO CAPS
1000.0000 mg | ORAL_CAPSULE | Freq: Two times a day (BID) | ORAL | Status: DC
  Administered 2022-01-15 – 2022-01-24 (×16): 1000 mg via ORAL
  Filled 2022-01-15 (×15): qty 2

## 2022-01-15 NOTE — Care Management Important Message (Signed)
Important Message  Patient Details  Name: Timothy Davenport MRN: [id number] Date of Birth: May 02, 1950   Medicare Important Message Given:  Yes     Hannah Beat 01/15/2022, 1:26 PM

## 2022-01-15 NOTE — Progress Notes (Cosign Needed Addendum)
Subjective:  The patient denies any pain today. He believes his pain is well controlled, and he has minimal pain with dressing changes. His last bowel movement was yesterday morning, 11/26. He still has low appetite, and reports eating a few bites of his meals in addition to the Ensure shakes.   Wife at bedside. We discussed his fall this morning with the patient and his wife. The patient did not remember the details of the fall and denied any body or head pain. We shared that his physical exam and CT Head were negative for any trauma. We also discussed with his wife that he seems more confused and active at night, and if these issues persist, we can consider a sitter for extra monitoring.   Objective:  Vital signs in last 24 hours: Vitals:   01/15/22 0430 01/15/22 0500 01/15/22 0655 01/15/22 0810  BP: 120/78 132/67 133/67 137/83  Pulse:  79 89 73  Resp: '17 14 16 17  '$ Temp: 98.1 F (36.7 C) 98.6 F (37 C) 98.1 F (36.7 C) 98.6 F (37 C)  TempSrc: Oral Oral Oral Oral  SpO2: 96% 96% 99% 99%  Weight:      Height:       Weight change:   Intake/Output Summary (Last 24 hours) at 01/15/2022 1130 Last data filed at 01/15/2022 0325 Gross per 24 hour  Intake 250 ml  Output 570 ml  Net -320 ml   General: Appears to be resting comfortably in bed; No acute distress HEENT: Normocephalic, atraumatic; pinpoint pupils that are poorly reactive to light  Cardiovascular: RRR; no murmurs, rubs, or gallops; 2+ radial pulses Pulmonary: Normal respiratory effort; Lungs clear to auscultation anteriorly Skin: Warm and dry; Wound dressing over perineum, no visible swelling, erythema, bleeding, or drainage beyond the dressing; 30 mL of dark red drainage from pelvic abscess drain Neuro: A&O x 3, not fully oriented to situation, redirectable; endorsing visual hallucinations; sometimes does not answer questions appropriately    Assessment/Plan:  Principal Problem:   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 with a pertinent PMH of type 2 diabetes and paraplegia, who presented with worsenign scrotal infection and admitted for fournier gangrene while on an SGLT-2i. He is status post 2 debridements and IR guided drain placement of pelvic abscess.   Fournier gangrene s/p debridement x2 Right seminal vesicle abscess s/p IR drain placement The patient is hemodynamically stable. His pain is well controlled. His bandages are clean and dry and pelvic abscess drain in in place. He is on Zosyn day 4, total antibiotics course day 11. Will continue to follow abscess aspirate cultures and susceptibilities to transition the patient to an oral agent. Plastic surgery was consulted, they plan to see him this afternoon. SW will work with coordinating with [place] surgical team, ID, and plastics assistance in his care - Zosyn day 4, total antibiotics course day 11 - Pain management with tylenol, oxycodone, and dilaudid PRN - Follow abscess susceptibilities - Daily wet-to-dry dressings - Diet supplements given - Encourage nutrition  Hypophosphatemia, resolved Hypokalemia, improving Phosphorus and potassium were replenished. The patient continues to have poor appetite, so discussed the importance of increasing his food intake for proper wound healing. We also encouraged his wife to visit for meal time and bring the patient any foods he finds palatable. Will  recheck BMP tomorrow and replete electrolytes as needed.   Unwitnessed fall Per nursing report, patient was found on the ground on his back early this morning, and he had no recollection of what led up to the fall. Physical exam and CT Head were negative for any abnormality. He denies pain. Discussed with the patient and his wife that the patient appears to be more active and confused at night, so will  consider a sitter to ensure safety if needed. Has constricted pupils on exam, been given dilaudid for pain but continues to deny any pain, goal is to avoid sedating drugs. - Sitter if needed - Avoid sedation medications   Hospital delirium The patient is A&O x 3 today, but is not fully oriented to situation. He asked twice this morning if someone was behind him or standing in front of him today when no one was there. Will continue delirium precautions to keep the patient on a regular sleep/wake cycle and encourage family to be present when able.  - Delirium precautions - Avoid centrally acting medications  Insulin dependent type 2 diabetes mellitus CBG within goal -novolog SSI   Chronic medical conditions Bladder spasms - continue dantrolene Depression - continue zoloft HLD - continue simvastatin    LOS: 10 days   [name], Medical Student 01/15/2022, 11:30 AM  Attestation for Student Documentation:  I personally was present and performed or re-performed the history, physical exam and medical decision-making activities of this service and have verified that the service and findings are accurately documented in the student's note.  [name], DO 01/15/2022, 2:52 PM

## 2022-01-15 NOTE — TOC Progression Note (Addendum)
Transition of Care [facility]) - Progression Note    Patient Details  Name: JABBAR PALMERO MRN: [id number] Date of Birth: 17-Nov-1950  Transition of Care [facility] CM/SW [place], [place] Phone Number: 01/15/2022, 4:11 PM  Clinical Narrative:     Patients passr approved. [phone] E. CSW awaiting callback from [place] with [facility] to confirm SNF bed for patient.[name] TOC will continue to follow.   Expected Discharge Plan: [place] Barriers to Discharge: Continued Medical Work up  Expected Discharge Plan and Services Expected Discharge Plan: [place] In-house Referral: Clinical Social Work     Living arrangements for the past 2 months: Single Family Home                                       Social Determinants of Health (SDOH) Interventions    Readmission Risk Interventions    04/26/2021    1:38 PM  Readmission Risk Prevention Plan  Post Dischage Appt Complete  Medication Screening Complete  Transportation Screening Complete

## 2022-01-15 NOTE — Progress Notes (Addendum)
Patient had a fall unwitnessed, post fall event formed filled  [phone] Relative aware, all post fall protocols observed, pls see notes below.  01/15/22 0311  What Happened  Was fall witnessed? No  Was patient injured? No  Patient found on floor  Found by Staff-comment (found by NA on the floor)  Stated prior activity other (comment) (patient unable to state what he was reaching for)  Provider Notification  Provider Name/Title Dellia Beckwith MD  Date Provider Notified 01/15/22  Time Provider Notified 0320  Method of Notification Page  Notification Reason Fall  Provider response At bedside  Date of Provider Response 01/15/22  Time of Provider Response 0328  Follow Up  Family notified Yes - comment (Hazel Meneil Spouse notified)  Time family notified 0322  Additional tests No  Simple treatment Dressing  Progress note created (see row info) Yes  Adult Fall Risk Assessment  Risk Factor Category (scoring not indicated) High fall risk per protocol (document High fall risk) (paralysed from waist down)  Age 71  Fall History: Fall within 6 months prior to admission 0  Elimination; Bowel and/or Urine Incontinence 2  Elimination; Bowel and/or Urine Urgency/Frequency 2  Medications: includes PCA/Opiates, Anti-convulsants, Anti-hypertensives, Diuretics, Hypnotics, Laxatives, Sedatives, and Psychotropics 3  Patient Care Equipment 2  Mobility-Assistance 2  Mobility-Gait 2  Mobility-Sensory Deficit 0  Altered awareness of immediate physical environment 0  Impulsiveness 2  Lack of understanding of one's physical/cognitive limitations 0  Total Score 17  Patient Fall Risk Level High fall risk  Adult Fall Risk Interventions  Required Bundle Interventions *See Row Information* High fall risk - low, moderate, and high requirements implemented  Additional Interventions Use of appropriate toileting equipment (bedpan, BSC, etc.)  Screening for Fall Injury Risk (To be completed on HIGH fall risk  patients) - Assessing Need for Floor Mats  Risk For Fall Injury- Criteria for Floor Mats None identified - No additional interventions needed  Will Implement Floor Mats Yes  Vitals  Temp 98 F (36.7 C)  Temp Source Oral  BP 125/79  MAP (mmHg) 94  BP Location Right Arm  BP Method Automatic  Patient Position (if appropriate) Lying  Pulse Rate 87  Pulse Rate Source Monitor  Resp 18  Oxygen Therapy  SpO2 100 %  O2 Device Room Air

## 2022-01-15 NOTE — Progress Notes (Signed)
6 Days Post-Op  Subjective:  No new complaints. Fell early this am. Tolerating dressing changes Discussed with RN and patient with intermittent delirium - removed his dressing this am and was replaced just prior to my visit   Objective: Vital signs in last 24 hours: Temp:  [97.7 F (36.5 C)-99.2 F (37.3 C)] 98.6 F (37 C) (11/27 0810) Pulse Rate:  [73-95] 73 (11/27 0810) Resp:  [14-18] 17 (11/27 0810) BP: (120-145)/(65-109) 137/83 (11/27 0810) SpO2:  [96 %-100 %] 99 % (11/27 0810) Last BM Date : 01/13/22  Intake/Output from previous day: 11/26 0701 - 11/27 0700 In: 250 [P.O.:250] Out: 570 [Urine:550; Drains:20] Intake/Output this shift: No intake/output data recorded.  PE: General: NAD Resp: normal work of breathing Abd/GU: dressings in place, foley draining clear yellow urine. Drain SS  Lab Results:  No results for input(s): "WBC", "HGB", "HCT", "PLT" in the last 72 hours.  BMET Recent Labs    01/13/22 0233 01/14/22 0248  NA 140 141  K 3.1* 3.5  CL 103 104  CO2 29 28  GLUCOSE 139* 126*  BUN 9 8  CREATININE 0.90 0.99  CALCIUM 9.3 9.4    PT/INR No results for input(s): "LABPROT", "INR" in the last 72 hours. CMP     Component Value Date/Time   NA 141 01/14/2022 0248   NA 137 03/08/2021 1137   K 3.5 01/14/2022 0248   CL 104 01/14/2022 0248   CO2 28 01/14/2022 0248   GLUCOSE 126 (H) 01/14/2022 0248   BUN 8 01/14/2022 0248   BUN 17 03/08/2021 1137   CREATININE 0.99 01/14/2022 0248   CALCIUM 9.4 01/14/2022 0248   PROT 7.7 01/05/2022 0900   PROT 7.9 03/08/2021 1137   ALBUMIN 2.6 (L) 01/05/2022 0900   ALBUMIN 3.9 03/08/2021 1137   AST 16 01/05/2022 0900   ALT 12 01/05/2022 0900   ALKPHOS 71 01/05/2022 0900   BILITOT 0.6 01/05/2022 0900   BILITOT 0.2 03/08/2021 1137   GFRNONAA >60 01/14/2022 0248   GFRAA >60 07/20/2019 0550   Lipase  No results found for: "LIPASE"     Studies/Results: CT HEAD WO CONTRAST (5MM)  Result Date:  01/15/2022 CLINICAL DATA:  Head trauma, fall. EXAM: CT HEAD WITHOUT CONTRAST TECHNIQUE: Contiguous axial images were obtained from the base of the skull through the vertex without intravenous contrast. RADIATION DOSE REDUCTION: This exam was performed according to the departmental dose-optimization program which includes automated exposure control, adjustment of the mA and/or kV according to patient size and/or use of iterative reconstruction technique. COMPARISON:  01/03/2022. FINDINGS: Brain: No acute intracranial hemorrhage, midline shift or mass effect. No extra-axial fluid collection. Mild diffuse atrophy is noted. Mild periventricular white matter hypodensities bilaterally. No hydrocephalus. Vascular: No hyperdense vessel or unexpected calcification. Skull: Normal. A stable extra-axial calcified lesion is noted over the frontal lobe on the left. Sinuses/Orbits: No acute finding. Other: None. IMPRESSION: Stable head CT with no acute intracranial hemorrhage. Electronically Signed   By: Brett Fairy M.D.   On: 01/15/2022 05:05    Anti-infectives: Anti-infectives (From admission, onward)    Start     Dose/Rate Route Frequency Ordered Stop   01/12/22 1230  piperacillin-tazobactam (ZOSYN) IVPB 3.375 g        3.375 g 12.5 mL/hr over 240 Minutes Intravenous Every 8 hours 01/12/22 1139     01/11/22 1400  ceFAZolin (ANCEF) IVPB 2g/100 mL premix  Status:  Discontinued        2 g 200 mL/hr  over 30 Minutes Intravenous Every 8 hours 01/10/22 1330 01/12/22 1007   01/08/22 1400  cefTRIAXone (ROCEPHIN) 2 g in sodium chloride 0.9 % 100 mL IVPB  Status:  Discontinued        2 g 200 mL/hr over 30 Minutes Intravenous Every 24 hours 01/08/22 1250 01/10/22 1330   01/07/22 1500  piperacillin-tazobactam (ZOSYN) IVPB 3.375 g  Status:  Discontinued        3.375 g 12.5 mL/hr over 240 Minutes Intravenous Every 8 hours 01/07/22 1357 01/08/22 1244   01/06/22 1100  vancomycin (VANCOCIN) IVPB 1000 mg/200 mL premix  Status:   Discontinued        1,000 mg 200 mL/hr over 60 Minutes Intravenous Every 24 hours 01/05/22 0946 01/05/22 0949   01/06/22 0000  vancomycin variable dose per unstable renal function (pharmacist dosing)  Status:  Discontinued         Does not apply See admin instructions 01/05/22 0949 01/05/22 1514   01/05/22 2200  ceFEPIme (MAXIPIME) 2 g in sodium chloride 0.9 % 100 mL IVPB  Status:  Discontinued        2 g 200 mL/hr over 30 Minutes Intravenous Every 12 hours 01/05/22 0946 01/05/22 1514   01/05/22 2000  piperacillin-tazobactam (ZOSYN) IVPB 3.375 g  Status:  Discontinued        3.375 g 12.5 mL/hr over 240 Minutes Intravenous Every 8 hours 01/05/22 1711 01/07/22 1250   01/05/22 1515  linezolid (ZYVOX) IVPB 600 mg  Status:  Discontinued        600 mg 300 mL/hr over 60 Minutes Intravenous Every 12 hours 01/05/22 1514 01/08/22 1244   01/05/22 0945  ceFEPIme (MAXIPIME) 2 g in sodium chloride 0.9 % 100 mL IVPB        2 g 200 mL/hr over 30 Minutes Intravenous  Once 01/05/22 0932 01/05/22 1038   01/05/22 0945  metroNIDAZOLE (FLAGYL) IVPB 500 mg        500 mg 100 mL/hr over 60 Minutes Intravenous  Once 01/05/22 0932 01/05/22 1103   01/05/22 0945  vancomycin (VANCOCIN) IVPB 1000 mg/200 mL premix  Status:  Discontinued        1,000 mg 200 mL/hr over 60 Minutes Intravenous  Once 01/05/22 0932 01/05/22 0944   01/05/22 0945  vancomycin (VANCOREADY) IVPB 2000 mg/400 mL        2,000 mg 200 mL/hr over 120 Minutes Intravenous  Once 01/05/22 0944 01/05/22 1256        Assessment/Plan 71 yo male with 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  s/p debridement, POD2 second look debridement by Dr. Zenia Resides and Dr. Cain Sieve 11/17 and 11/19 POD6 wound exploration and washout 11/21 Dr. 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

## 2022-01-15 NOTE — Progress Notes (Addendum)
[facility]  Date of Admission:  01/05/2022     Abx: 11/19-20; 11/24-c piptazo   11/23-24 cefazolin 11/20-22 ceftriaxone 11/17-19 cefepime 11/17-20 linezolid                                                           11/15-17 cefadroxil   Assessment: 71 yo male with dm2 on insulin, t8 paraplegia (fall), neurogenic bladder intermittent straight cath, ckd3, hx cva, admitted 11/17 for progressive 2 weeks of perineal/left scrotal cellulitis found to have Fournier's gangrene   He was seen just prior to admission on 11/15 in ed and treated for presumed uti/balanitis with abx   11/17 bcx ngtd 11/17 ucx insignificant growth 11/17 operative cx rare citrobacter koseri   He underwent initial I&D by general surgery & urology 11/17 -- extension of necrosis in scrotum/perineum extending into left inguinal canal   Due to polymicrobial nature of infection, despite only citrobacter isolated, will keep on piptazo for now until all surgery/drainage is done and will transition to PO abx to finish course of treatment. He also had received abx prior to surgery   No mrsa is found which usually/readily grow on culture if a player    There is a repeat ct 11/24 of the pelvis that showed slightly enlarging abscess pending ir placement of percutaneous drain   ------------ 11/27 assessment From id perspective stable/improving Had a glf last night Surgery still eavluating coverage for perineal wound -- no further I&D needed  Drain remained in for the right pelvis abscess - cx also citrobacter koseri     Plan: Can switch to oral amox today; please plan for 4 more weeks I will see in clinic in around 2-3 weeks to see if can stop early If patient is still here next week, please reengage ID to see if abx can be stopped (4 weeks written but when the radiologist remove the drain, we can treat 5 more days after) Surgical plan for flap coverage of wound will not affect  abx plan Discussed with primary team   Clinic Follow Up Appt: 12/14 @ 330 with dr Gale Journey  @  [facility] [place], [place], [place] [address] Phone: [phone]  ---------- Addendum Due to high rate amox resistance and no testing for the beta-lactamase inhibitor available, will do cefadroxil/flagyl combination instead -- susceptibility was done for cefazolin and is sensitive Discussed with team     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 Active Problems:   Paraplegia ([facility])   Neurogenic bladder   Spasticity   Diabetes mellitus type 2 in nonobese ([facility])   Allergies  Allergen Reactions   Jardiance [Empagliflozin] Other (See Comments)    Fournier Gangrene 2023   Lisinopril Other (See Comments)    Angioedema   Enalapril-Hctz [Enalapril-Hydrochlorothiazide] Rash    Scheduled Meds:  Chlorhexidine Gluconate Cloth  6 each Topical Daily   dantrolene  25 mg Oral TID   enoxaparin (LOVENOX) injection  40 mg Subcutaneous Daily   feeding supplement  237 mL Oral BID BM   insulin aspart  0-9 Units Subcutaneous TID WC   lidocaine  10 mL Intradermal Once   multivitamin with minerals  1 tablet Oral Daily   oxybutynin  7.5 mg Oral  BID   polyethylene glycol  17 g Oral Daily   sertraline  100 mg Oral Daily   simvastatin  20 mg Oral q1800   sodium chloride flush  5 mL Intracatheter Q8H   Continuous Infusions:  lactated ringers     And   lactated ringers     piperacillin-tazobactam (ZOSYN)  IV 3.375 g (01/15/22 1311)   PRN Meds:.acetaminophen, HYDROmorphone (DILAUDID) injection, oxyCODONE, phenol   SUBJECTIVE: Fall last night glf Head ct no intracranial hemorrhage  Pending evaluation for coverage of perineal wound Drain remain in for the pelvic abscess -cx also with citrobacter koseri  Review of Systems: ROS All other ROS was negative, except mentioned above     OBJECTIVE: Vitals:   01/15/22 0430 01/15/22 0500 01/15/22 0655 01/15/22 0810  BP:  120/78 132/67 133/67 137/83  Pulse:  79 89 73  Resp: '17 14 16 17  '$ Temp: 98.1 F (36.7 C) 98.6 F (37 C) 98.1 F (36.7 C) 98.6 F (37 C)  TempSrc: Oral Oral Oral Oral  SpO2: 96% 96% 99% 99%  Weight:      Height:       Body mass index is 23.71 kg/m.  Physical Exam  General/constitutional: no distress, pleasant HEENT: Normocephalic, PER, Conj Clear, EOMI, Oropharynx clear Neck supple CV: rrr no mrg Lungs: clear to auscultation, normal respiratory effort Abd: Soft, Nontender Ext: no edema Skin: dressing of groin/perineal wound clean; pelvic drain with sesrosanguinous fluid Neuro: alert; appropropriate; no focal deficit MSK: no peripheral joint swelling/tenderness/warmth; back spines nontender  Lab Results Lab Results  Component Value Date   WBC 9.8 01/10/2022   HGB 8.9 (L) 01/10/2022   HCT 27.4 (L) 01/10/2022   MCV 77.8 (L) 01/10/2022   PLT 191 01/10/2022    Lab Results  Component Value Date   CREATININE 0.99 01/14/2022   BUN 8 01/14/2022   NA 141 01/14/2022   K 3.5 01/14/2022   CL 104 01/14/2022   CO2 28 01/14/2022    Lab Results  Component Value Date   ALT 12 01/05/2022   AST 16 01/05/2022   ALKPHOS 71 01/05/2022   BILITOT 0.6 01/05/2022      Microbiology: Recent Results (from the past 240 hour(s))  Blood culture (routine x 2)     Status: None   Collection Time: 01/05/22  6:18 PM   Specimen: BLOOD RIGHT HAND  Result Value Ref Range Status   Specimen Description BLOOD RIGHT HAND  Final   Special Requests   Final    BOTTLES DRAWN AEROBIC AND ANAEROBIC Blood Culture results may not be optimal due to an excessive volume of blood received in culture bottles   Culture   Final    NO GROWTH 5 DAYS Performed at [facility], [address], [place], [place] [address]    Report Status 01/10/2022 FINAL  Final  Aerobic/Anaerobic Culture w Gram Stain (surgical/deep wound)     Status: None (Preliminary result)   Collection Time: 01/12/22  2:38 PM    Specimen: Abscess  Result Value Ref Range Status   Specimen Description ABSCESS  Final   Special Requests PELVIC ASPIRATE  Final   Gram Stain   Final    ABUNDANT WBC PRESENT, PREDOMINANTLY PMN RARE GRAM NEGATIVE RODS Performed at [facility], [place] [address] [place], [place] [address]    Culture   Final    RARE CITROBACTER KOSERI NO ANAEROBES ISOLATED; CULTURE IN PROGRESS FOR 5 DAYS    Report Status PENDING  Incomplete   Organism ID, Bacteria CITROBACTER KOSERI  Final      Susceptibility   Citrobacter koseri - MIC*    CEFAZOLIN <=4 SENSITIVE Sensitive     CEFEPIME <=0.12 SENSITIVE Sensitive     CEFTAZIDIME <=1 SENSITIVE Sensitive     CEFTRIAXONE <=0.25 SENSITIVE Sensitive     CIPROFLOXACIN <=0.25 SENSITIVE Sensitive     GENTAMICIN <=1 SENSITIVE Sensitive     IMIPENEM <=0.25 SENSITIVE Sensitive     TRIMETH/SULFA <=20 SENSITIVE Sensitive     PIP/TAZO <=4 SENSITIVE Sensitive     * RARE CITROBACTER KOSERI     Serology:   Imaging: If present, new imagings (plain films, ct scans, and mri) have been personally visualized and interpreted; radiology reports have been reviewed. Decision making incorporated into the Impression / Recommendations.  11/17 ct abd pelv 1. Extensive gas tracking from the scrotum on the left into the soft tissues of the lower anterior pelvic wall highly concerning for necrotizing infection/Fournier's gangrene. 2. 3.8 cm x 2.9 cm x 4.4 cm peripherally enhancing collection in the region of the right seminal vesicle superior to the prostate concerning for abscess, and additional possible small 1.0 cm prostatic abscess. 3. Small amount of gas in the bladder may be related to recent instrumentation or spread of infection. 4. 7 mm nonobstructing right lower pole renal stone.   11/24 ct abd/pelv 1. Slight increase in size of abscess collection adjacent to the sigmoid colon and RIGHT seminal vesicle, now measuring 4.1 x 4.0 centimeters, previously 3.6  x 3.7 centimeters. No new fluid collections. 2. Similar large surgical defect involving the LOWER anterior abdominal wall and perineum. 3. Nonspecific 1.1 centimeter low-attenuation lesion within the posterior RIGHT ilium. 4.  Aortic Atherosclerosis   Jabier Mutton, [place] [facility] [place] [phone] pager    01/15/2022, 3:42 PM

## 2022-01-15 NOTE — Progress Notes (Signed)
Spoke with pt's wife and daughter regarding his fall early this morning. Explained that investigations are still ongoing regarding the circumstances of the fall and that high fall risk interventions have been initiated. Floor mats and bed alarm in use. Family seemed ok with the explanation provided at this time.

## 2022-01-15 NOTE — Progress Notes (Signed)
6 Days Post-Op Subjective: Patient feeling well - more alert today but somewhat disoriented. He reportedly had an unwitnessed fall overnight; CT head unremarkable.    Objective: Vital signs in last 24 hours: Temp:  [97.7 F (36.5 C)-99.2 F (37.3 C)] 98.6 F (37 C) (11/27 0810) Pulse Rate:  [73-95] 73 (11/27 0810) Resp:  [14-18] 17 (11/27 0810) BP: (120-145)/(65-109) 137/83 (11/27 0810) SpO2:  [96 %-100 %] 99 % (11/27 0810)  Intake/Output from previous day: 11/26 0701 - 11/27 0700 In: 250 [P.O.:250] Out: 570 [Urine:550; Drains:20] Intake/Output this shift: No intake/output data recorded.  Physical Exam:  General: Alert and oriented Wound was freshly dressed Catheter draining  yellow urine Drain with s/s output  Lab Results: No results for input(s): "HGB", "HCT" in the last 72 hours. BMET Recent Labs    01/13/22 0233 01/14/22 0248  NA 140 141  K 3.1* 3.5  CL 103 104  CO2 29 28  GLUCOSE 139* 126*  BUN 9 8  CREATININE 0.90 0.99  CALCIUM 9.3 9.4      Studies/Results: CT HEAD WO CONTRAST (5MM)  Result Date: 01/15/2022 CLINICAL DATA:  Head trauma, fall. EXAM: CT HEAD WITHOUT CONTRAST TECHNIQUE: Contiguous axial images were obtained from the base of the skull through the vertex without intravenous contrast. RADIATION DOSE REDUCTION: This exam was performed according to the departmental dose-optimization program which includes automated exposure control, adjustment of the mA and/or kV according to patient size and/or use of iterative reconstruction technique. COMPARISON:  01/03/2022. FINDINGS: Brain: No acute intracranial hemorrhage, midline shift or mass effect. No extra-axial fluid collection. Mild diffuse atrophy is noted. Mild periventricular white matter hypodensities bilaterally. No hydrocephalus. Vascular: No hyperdense vessel or unexpected calcification. Skull: Normal. A stable extra-axial calcified lesion is noted over the frontal lobe on the left.  Sinuses/Orbits: No acute finding. Other: None. IMPRESSION: Stable head CT with no acute intracranial hemorrhage. Electronically Signed   By: Brett Fairy M.D.   On: 01/15/2022 05:05    Assessment/Plan: Fournier's gangrene status post extensive GU and abdominal debridement and recent placement now of percutaneous drain in the right pelvic abscess.  Cultures positive for pan-sensitive citrobacter  -patient awaiting eval by plastics for wound coverage -continue foley -continue wound care    LOS: 10 days   [place] 01/15/2022, 1:09 PM

## 2022-01-15 NOTE — Progress Notes (Signed)
Physical Therapy Treatment Patient Details Name: Timothy Davenport MRN: [id number] DOB: April 16, 1950 Today's Date: 01/15/2022   History of Present Illness Pt is a 71 yo male admitted for gangrene of the scrotum.  Pt with excisional debridement of B groin and lower abdomen. Third I&D of wound on 11/21.  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 PHM: T8 incomplete paraplegia with neurogenic bladder.    PT Comments    Pt and family very agreeable to participation in therapy. Agreed that give fall, and increased pressure to scrotum and abdomen in sitting to defer OOB to recliner. Reviewed need to reduce sheer and educated on use of bed pad and rolling to bring hips to EoB. Pt requires modA to come to EoB. Once EoB utilized Stedy for 4x STS for ~20-30 sec each pt able to work on improved posture in standing with mod-maxA for power up and mod A for maintenance of standing posture. D/c plans remain appropriate.     Recommendations for follow up therapy are one component of a multi-disciplinary discharge planning process, led by the attending physician.  Recommendations may be updated based on patient status, additional functional criteria and insurance authorization.  Follow Up Recommendations  Skilled nursing-short term rehab (<3 hours/day) Can patient physically be transported by private vehicle: No   Assistance Recommended at Discharge Frequent or constant Supervision/Assistance  Patient can return home with the following Two people to help with walking and/or transfers;Two people to help with bathing/dressing/bathroom;Direct supervision/assist for medications management;Help with stairs or ramp for entrance;Assist for transportation   Equipment Recommendations  None recommended by PT       Precautions / Restrictions Precautions Precautions: Fall;Other (comment) (wound care) Precaution Comments: significant open abdominal/groin wound Required Braces or Orthoses: Other Brace (Pt with significant dressing to  groin area) Other Brace: bulky dressing to groin area held in place with mesh underwear Restrictions Weight Bearing Restrictions: No Other Position/Activity Restrictions: Avoid shearing during transfers and bed mobility due to groin debridement.     Mobility  Bed Mobility Overal bed mobility: Needs Assistance Bed Mobility: Rolling, Supine to Sit, Sit to Sidelying Rolling: Mod assist Sidelying to sit: Mod assist   Sit to supine: Mod assist   General bed mobility comments: provided modA for lifting hips using bed pad to bring closer to EoB and then to bring into sidelying. managing LE off bed and bringing trunk to upright, requires modA to reverse actions to get back to bed    Transfers Overall transfer level: Needs assistance   Transfers: Sit to/from Stand, Bed to chair/wheelchair/BSC Sit to Stand: Mod assist, Max assist, From elevated surface           General transfer comment: With use of the Maximove, pt was able to come to standing x 4 for 20~30 sec each, vc for rotating hips forward and for neutral head positioning        Balance Overall balance assessment: Needs assistance Sitting-balance support: Feet supported Sitting balance-Leahy Scale: Fair     Standing balance support: During functional activity, Bilateral upper extremity supported, Reliant on assistive device for balance Standing balance-Leahy Scale: Zero Standing balance comment: requires outside support however with support from Stedy able to improve knee extension                            Cognition Arousal/Alertness: Awake/alert Behavior During Therapy: WFL for tasks assessed/performed Overall Cognitive Status: Within Functional Limits for tasks assessed  General Comments: not able to remember falling our of bed early this morning, but able           General Comments  Family present during session and appreciative of working on standing        Pertinent Vitals/Pain Pain Assessment Pain Assessment: No/denies pain Breathing: normal Negative Vocalization: none Facial Expression: smiling or inexpressive Body Language: relaxed Consolability: no need to console PAINAD Score: 0     PT Goals (current goals can now be found in the care plan section) Acute Rehab PT Goals PT Goal Formulation: With patient Time For Goal Achievement: 01/24/22 Potential to Achieve Goals: Good Progress towards PT goals: Progressing toward goals    Frequency    Min 3X/week      PT Plan Current plan remains appropriate       AM-PAC PT "6 Clicks" Mobility   Outcome Measure  Help needed turning from your back to your side while in a flat bed without using bedrails?: A Little Help needed moving from lying on your back to sitting on the side of a flat bed without using bedrails?: A Lot Help needed moving to and from a bed to a chair (including a wheelchair)?: A Lot Help needed standing up from a chair using your arms (e.g., wheelchair or bedside chair)?: A Lot Help needed to walk in hospital room?: Total Help needed climbing 3-5 steps with a railing? : Total 6 Click Score: 11    End of Session Equipment Utilized During Treatment: Gait belt Activity Tolerance: Patient tolerated treatment well Patient left: with family/visitor present;with bed alarm set;in bed Nurse Communication: Mobility status PT Visit Diagnosis: Other abnormalities of gait and mobility (R26.89);Muscle weakness (generalized) (M62.81)     Time: 5701-7793 PT Time Calculation (min) (ACUTE ONLY): 26 min  Charges:  $Therapeutic Exercise: 8-22 mins $Therapeutic Activity: 8-22 mins                     Helyn Schwan B. Migdalia Dk PT, DPT Acute Rehabilitation Services Please use secure chat or  Call Office [phone]    [place] 01/15/2022, 3:56 PM

## 2022-01-15 NOTE — Progress Notes (Signed)
Patient had unwitnessed fall per nursing and was found flat on his back, unable to tell if he hit his head. Went to evaluate the patient and he is at baseline oriented to person and place. Denies head or any other body pain. No ptp, step offs, crepitus, fluctuance of the head, shoulders, elbows, hips or knees. CN 2-12 grossly intact, pupils pinpoint and not responsive to light. Will obtain CT head without contrast to rule out bleed or fracture.

## 2022-01-15 NOTE — Progress Notes (Signed)
11/27 Initially patient "Refused" to sign IMM Letter, but at presentation and explanation today patient asked to sign letter. Patient stated that he felt unwell upon initial request and did not feel like signing.

## 2022-01-15 NOTE — Progress Notes (Signed)
Referring Physician(s): Dr. Evette Doffing   Supervising Physician: Michaelle Birks  Patient Status:  [facility] - In-pt  Chief Complaint: Fournier's gangrene status post surgical debridement. Post-procedure pelvic abscess now with drain placed in IR 01/12/22 by Dr. Maryelizabeth Kaufmann  Subjective:  Patient resting in bed, family members at bedside.  He denies abd pain/discomfort, N/V.  Unsure d/c plan yet.    Allergies: Jardiance [empagliflozin], Lisinopril, and Enalapril-hctz [enalapril-hydrochlorothiazide]  Medications: Prior to Admission medications   Medication Sig Start Date End Date Taking? Authorizing Provider  Alogliptin Benzoate 25 MG TABS Take 12.5 tablets by mouth daily with breakfast.   Yes [provider]  Ascorbic Acid 500 MG CAPS Take 500 mg by mouth every Monday, Wednesday, and Friday. 05/03/21  Yes [provider]  aspirin EC 81 MG tablet Take 1 tablet (81 mg total) by mouth daily. Swallow whole. 04/30/21  Yes Elgergawy, Silver Huguenin, MD  baclofen (LIORESAL) 20 MG tablet Take 20 mg by mouth every 8 (eight) hours. 01/11/20  Yes [provider]  cefadroxil (DURICEF) 500 MG capsule Take 1 capsule (500 mg total) by mouth 2 (two) times daily. 01/03/22  Yes Prosperi, Christian H, PA-C  Cholecalciferol 100 MCG (4000 UT) TABS Take 4,000 Units by mouth daily. 01/10/21  Yes [provider]  dantrolene (DANTRIUM) 25 MG capsule Take 75 mg by mouth 2 (two) times daily.   Yes [provider]  docusate sodium (COLACE) 50 MG capsule Take 50 mg by mouth daily.   Yes [provider]  empagliflozin (JARDIANCE) 25 MG TABS tablet Take 12.5 mg by mouth daily. 10/20/20  Yes [provider]  ferrous gluconate (FERGON) 324 MG tablet Take 324 mg by mouth every Monday, Wednesday, and Friday. 05/03/21  Yes [provider]  folic acid (FOLVITE) 1 MG tablet Take 1 mg by mouth daily. 12/06/21  Yes [provider]  gabapentin (NEURONTIN) 400 MG capsule  Take 800 mg by mouth 3 (three) times daily. 01/28/20  Yes [provider]  glucose 4 GM chewable tablet Chew 4 tablets by mouth as needed for low blood sugar.   Yes [provider]  insulin glargine-yfgn (SEMGLEE) 100 UNIT/ML Pen Inject 10 Units into the skin at bedtime. 12/06/21  Yes [provider]  losartan (COZAAR) 50 MG tablet Take 75 mg by mouth daily. 12/06/21  Yes [provider]  omeprazole (PRILOSEC) 20 MG capsule Take 20 mg by mouth daily. 07/31/21  Yes [provider]  oxybutynin (DITROPAN) 5 MG tablet Take 5 mg by mouth 2 (two) times daily.   Yes [provider]  pioglitazone (ACTOS) 30 MG tablet Take 30 mg by mouth daily.   Yes [provider]  sertraline (ZOLOFT) 100 MG tablet Take 100 mg by mouth daily.   Yes [provider]  simvastatin (ZOCOR) 20 MG tablet Take 20 mg by mouth daily at 6 PM.   Yes [provider]  acetaminophen (TYLENOL) 325 MG tablet Take 1-2 tablets (325-650 mg total) by mouth every 4 (four) hours as needed for mild pain. Patient not taking: Reported on 01/06/2022 07/20/19   Love, Ivan Anchors, PA-C  bisacodyl (DULCOLAX) 10 MG suppository UNWRAP AND INSERT 1 SUPPOSITORY(10 MG) RECTALLY DAILY AFTER SUPPER Patient not taking: Reported on 01/06/2022 08/21/19   Raulkar, Clide Deutscher, MD  dantrolene (DANTRIUM) 100 MG capsule Take 1 capsule (100 mg total) by mouth 3 (three) times daily. Patient not taking: Reported on 01/06/2022 03/08/21   Courtney Heys, MD  polyvinyl alcohol (  LIQUIFILM TEARS) 1.4 % ophthalmic solution Place 1 drop into both eyes 4 (four) times daily. Patient not taking: Reported on 01/06/2022 07/20/19   Bary Leriche, PA-C     Vital Signs: BP (!) 131/95 (BP Location: Left Arm)   Pulse 69   Temp 98.6 F (37 C) (Oral)   Resp 16   Ht '5\' 11"'$  (1.803 m)   Wt 169 lb 15.6 oz (77.1 kg)   SpO2 100%   BMI 23.71 kg/m   Physical Exam Constitutional:      General: He is not in acute  distress.    Appearance: He is not ill-appearing.  Pulmonary:     Effort: Pulmonary effort is normal.  Abdominal:     Comments: Right TG drain to suction. Approximately 20 ml of sanguineous fluid in bulb. Site clean and dry, the drain is secured by suture only, no dressing. Flushes well, does not aspirate, 20 ml blood fluid in the bulb   Skin:    General: Skin is warm and dry.     Comments: Entire groin/lower abdominal area covered in dressing.   Neurological:     Mental Status: He is alert and oriented to person, place, and time.     Imaging: CT GUIDED VISCERAL FLUID DRAIN BY [facility] CATH  Result Date: 01/12/2022 INDICATION: Pelvic abscess. EXAM: CT-GUIDED RIGHT PELVIC ABSCESS DRAIN PLACEMENT COMPARISON:  CT AP, 01/05/2022. CT pelvis, 01/08/2022 and 01/12/2022. MEDICATIONS: 50 mg Benadryl IV. The patient is currently admitted to the hospital and receiving intravenous antibiotics. The antibiotics were administered within an appropriate time frame prior to the initiation of the procedure. ANESTHESIA/SEDATION: Local anesthetic and single agent sedation was employed during this procedure. A total of fentanyl 100 mcg was administered intravenously. The patient's level of consciousness and vital signs were monitored continuously by radiology nursing throughout the procedure under my direct supervision. CONTRAST:  None COMPLICATIONS: None immediate. PROCEDURE: RADIATION DOSE REDUCTION: This exam was performed according to the departmental dose-optimization program which includes automated exposure control, adjustment of the mA and/or kV according to patient size and/or use of iterative reconstruction technique. Informed written consent was obtained from the patient and/or patient's representative after a discussion of the risks, benefits and alternatives to treatment. The patient was placed prone on the CT gantry and a pre procedural CT was performed re-demonstrating the known abscess/fluid collection  within the RIGHT deep pelvis. The procedure was planned. A timeout was performed prior to the initiation of the procedure. The RIGHT gluteus was prepped and draped in the usual sterile fashion. The overlying soft tissues were anesthetized with 1% lidocaine with epinephrine. Appropriate trajectory was planned with the use of a 22 gauge spinal needle. An 18 gauge trocar needle was advanced into the abscess/fluid collection and a short Amplatz super stiff wire was coiled within the collection. Appropriate positioning was confirmed with a limited CT scan. The tract was serially dilated allowing placement of a 10 Fr drainage catheter. Appropriate positioning was confirmed with a limited postprocedural CT scan. 25 mL of purulent fluid was aspirated. The tube was connected to a bulb suction and sutured in place. A dressing was placed. The patient tolerated the procedure well without immediate post procedural complication. IMPRESSION: Successful placement of a 10 Fr drainage catheter into a RIGHT pelvic abscess via transgluteal approach, with aspiration of 25 mL of purulent fluid. Samples were sent to the laboratory as requested by the ordering clinical team. Michaelle Birks, MD [facility] Electronically Signed  By: Michaelle Birks M.D.   On: 01/12/2022 15:39   CT PELVIS W CONTRAST  Result Date: 01/12/2022 CLINICAL DATA:  Pelvic abscess. EXAM: CT PELVIS WITH CONTRAST TECHNIQUE: Multidetector CT imaging of the pelvis was performed using the standard protocol following the bolus administration of intravenous contrast. RADIATION DOSE REDUCTION: This exam was performed according to the departmental dose-optimization program which includes automated exposure control, adjustment of the mA and/or kV according to patient size and/or use of iterative reconstruction technique. CONTRAST:  8m OMNIPAQUE IOHEXOL 350 MG/ML SOLN COMPARISON:  01/08/2022 FINDINGS: Urinary Tract:  Distal ureters are unremarkable. Urinary bladder is decompressed by a Foley catheter. Bowel: Normal appearance of the appendix. Normal appearance of large bowel loops. Normal appearance of small bowel loops. Adjacent to the sigmoid colon and RIGHT seminal vesicle, there is a rim enhancing fluid collection measuring 4.1 x 4.0 centimeters, previously 3.6 x 3.7 centimeters at the same level. No new fluid collections. Vascular/Ly Mphatic: There is dense atherosclerotic calcification of the abdominal aorta and its branches. No retroperitoneal or mesenteric adenopathy. Reproductive: Prostate gland unremarkable. Abscess collection adjacent to RIGHT seminal vesicle, described above. Other: Large surgical defect involving the LOWER anterior abdominal wall and perineum, LEFT greater than RIGHT. The appearance is similar to prior study. Musculoskeletal: There is a nonspecific 1.1 centimeter low-attenuation lesion within the posterior RIGHT ilium. IMPRESSION: 1. Slight increase in size of abscess collection adjacent to the sigmoid colon and RIGHT seminal vesicle, now measuring 4.1 x 4.0 centimeters, previously 3.6 x 3.7 centimeters. No new fluid collections. 2. Similar large surgical defect involving the LOWER anterior abdominal wall and perineum. 3. Nonspecific 1.1 centimeter low-attenuation lesion within the posterior RIGHT ilium. 4.  Aortic Atherosclerosis (ICD10-I70.0). Electronically Signed   By: ENolon NationsM.D.   On: 01/12/2022 09:01    Labs:  CBC: Recent Labs    01/07/22 1405 01/08/22 0333 01/09/22 0234 01/10/22 0355  WBC 15.4* 13.2* 9.8 9.8  HGB 9.8* 8.8* 8.5* 8.9*  HCT 29.3* 26.5* 26.6* 27.4*  PLT 188 162 178 191    COAGS: Recent Labs    04/24/21 1410  INR 1.0    BMP: Recent Labs    01/08/22 0333 01/10/22 0355 01/12/22 0703 01/13/22 0233  NA 140 141 142 140  K 3.7 3.6 2.9* 3.1*  CL 108 103 103 103  CO2 '23 29 24 29  '$ GLUCOSE 150* 115* 140* 139*  BUN '18 13 10 9  '$ CALCIUM 9.8 9.7  9.4 9.3  CREATININE 1.12 0.90 0.91 0.90  GFRNONAA >60 >60 >60 >60    LIVER FUNCTION TESTS: Recent Labs    03/08/21 1137 04/24/21 0921 01/05/22 0900  BILITOT 0.2 0.3 0.6  AST 16 12* 16  ALT '8 11 12  '$ ALKPHOS 82 51 71  PROT 7.9 7.3 7.7  ALBUMIN 3.9 3.5 2.6*    Assessment and Plan:  Fournier's gangrene status post surgical debridement. Post-procedure pelvic abscess now with drain placed in IR 01/12/22 by Dr. MMaryelizabeth Kaufmann No CBC Afebrile Cx pending  Output 20 mL overnight, 20 mL in the bulb, bloody, serosanguinous today    Drain Location: RTG Size: Fr size: 10 Fr Date of placement: 11/24  Currently to: Drain collection device: suction bulb 24 hour output:  Output by Drain (mL) 01/13/22 0701 - 01/13/22 1900 01/13/22 1901 - 01/14/22 0700 01/14/22 0701 - 01/14/22 1900 01/14/22 1901 - 01/15/22 0700 01/15/22 0701 - 01/15/22 1311  Closed System Drain 1 Right Buttock Bulb (JP) 20 20  20  Interval imaging/drain manipulation:  None   Current examination: Flushes/aspirates easily.  Insertion site unremarkable. Suture and stat lock in place. Dressed appropriately.   Plan: Continue TID flushes with 5 cc NS. Record output Q shift. Dressing changes QD or PRN if soiled.  Call IR APP or on call IR MD if difficulty flushing or sudden change in drain output.  Repeat imaging/possible drain injection once output < 10 mL/QD (excluding flush material). Consideration for drain removal if output is < 10 mL/QD (excluding flush material), pending discussion with the providing surgical service.  Discharge planning: Please contact IR APP or on call IR MD prior to patient d/c to ensure appropriate follow up plans are in place. Typically patient will follow up with IR clinic 10-14 days post d/c for repeat imaging/possible drain injection. IR scheduler will contact patient with date/time of appointment. Patient will need to flush drain QD with 5 cc NS, record output QD, dressing changes every 2-3  days or earlier if soiled.   IR will continue to follow - please call with questions or concerns.     Electronically Signed:  Tera Mater PA-C 01/15/2022 1:12 PM       I spent a total of 15 Minutes at the the patient's bedside AND on the patient's hospital floor or unit, greater than 50% of which was counseling/coordinating care for right TG drain.

## 2022-01-16 DIAGNOSIS — N493 Fournier gangrene: Secondary | ICD-10-CM

## 2022-01-16 LAB — GLUCOSE, CAPILLARY
Glucose-Capillary: 125 mg/dL — ABNORMAL HIGH (ref 70–99)
Glucose-Capillary: 128 mg/dL — ABNORMAL HIGH (ref 70–99)
Glucose-Capillary: 158 mg/dL — ABNORMAL HIGH (ref 70–99)
Glucose-Capillary: 124 mg/dL — ABNORMAL HIGH (ref 70–99)

## 2022-01-16 LAB — CYTOLOGY - NON PAP

## 2022-01-16 LAB — PHOSPHORUS: Phosphorus: 2 mg/dL — ABNORMAL LOW (ref 2.5–4.6)

## 2022-01-16 LAB — BASIC METABOLIC PANEL
Anion gap: 7 (ref 5–15)
BUN: 9 mg/dL (ref 8–23)
CO2: 31 mmol/L (ref 22–32)
Calcium: 9.5 mg/dL (ref 8.9–10.3)
Chloride: 105 mmol/L (ref 98–111)
Creatinine, Ser: 0.99 mg/dL (ref 0.61–1.24)
GFR, Estimated: >60 mL/min (ref >60)
Glucose, Bld: 169 mg/dL — ABNORMAL HIGH (ref 70–99)
Potassium: 3 mmol/L — ABNORMAL LOW (ref 3.5–5.1)
Sodium: 143 mmol/L (ref 135–145)

## 2022-01-16 LAB — MAGNESIUM: Magnesium: 2 mg/dL (ref 1.7–2.4)

## 2022-01-16 MED ORDER — TRAZODONE HCL 50 MG PO TABS: 25.0000 mg | ORAL_TABLET | Freq: Every day | ORAL | Status: DC

## 2022-01-16 MED ORDER — POTASSIUM PHOSPHATES 15 MMOLE/5ML IV SOLN
15.0000 mmol | Freq: Once | INTRAVENOUS | Status: AC
  Administered 2022-01-16: 15 mmol via INTRAVENOUS
  Filled 2022-01-16: qty 5

## 2022-01-16 MED ORDER — MIRTAZAPINE 15 MG PO TABS
15.0000 mg | ORAL_TABLET | Freq: Every day | ORAL | Status: DC
  Administered 2022-01-16 – 2022-01-23 (×8): 15 mg via ORAL
  Filled 2022-01-16 (×8): qty 1

## 2022-01-16 NOTE — Progress Notes (Addendum)
Nutrition Follow-up  DOCUMENTATION CODES:  Not applicable  INTERVENTION:  -Continue Ensure Enlive BID to provide 350kcal and 20g protein/bottle -Provide meal setup and feeding assistance -Consider appetite stimulant  -If aggressive treatment desired, consider nutrition to meet increased needs for wound healing. -Continue MVI -Provide ordering assistance for meals to avoid missing meals.  NUTRITION DIAGNOSIS:  Increased nutrient needs related to wound healing as evidenced by estimated needs (increased metabolic need for healing).  GOAL:  Patient will meet greater than or equal to 90% of their needs -not met  MONITOR:  PO intake, Supplement acceptance  REASON FOR ASSESSMENT:  Consult Wound healing, Poor PO  ASSESSMENT:  Pt is a 71yo M with PMH of T2DM on insulin, paraplegia T8 spinal cord injury, neurogenic bladder, CKD 3, HTN, HLD, prior CVA who presents for worsening left scrotal infection.  Visited pt at bedside this AM. He reports, "I'm eating twice as much as I want to." Documented po intake from 11/27 shows meal acceptance of 0-10% of meals. Pt consumed 20% of breakfast this AM. Documentation shows pt is accepting 1-2 Ensure Enlive/day which provides 350kcal and 20g protein/bottle. Pt is meeting <50% estimated nutrient needs. Offer feeding assistance and encouragement at meal times. Continue Ensure Enlive BID. Consider starting appetite stimulant to improve po intake. If aggressive treatment is desired nutrition support should be considered.  Labs reviewed: K:3.0, Phos:2.0, CB:[phone]  Diet Order:   Diet Order             Diet Carb Modified Fluid consistency: Thin; Room service appropriate? Yes  Diet effective now                   EDUCATION NEEDS:   Not appropriate for education at this time  Skin:  Skin Assessment: Skin Integrity Issues: Skin Integrity Issues:: Incisions Incisions: groin  Last BM:  11/28  Height:  Ht Readings from Last 1 Encounters:   01/09/22 _0  (1.803 m)    Weight:  Wt Readings from Last 1 Encounters:  01/09/22 77.1 kg    BMI:  Body mass index is 23.71 kg/m.  Estimated Nutritional Needs:  Kcal:  2100-2400kcal Protein:  95-115g Fluid:  >1935m  KCandise Bowens MS, RD, LDN, CNSC See AMiON for contact information

## 2022-01-16 NOTE — Progress Notes (Signed)
  Progress Note   Date: 01/15/2022  Patient Name: Timothy Davenport        MRN#: [id number]  Potassium was given for hypokalemia

## 2022-01-16 NOTE — Progress Notes (Signed)
7 Days Post-Op  Subjective: No new complaints. Tolerating dressing changes. Talkative and oriented but has been confused.    Objective: Vital signs in last 24 hours: Temp:  [98.7 F (37.1 C)-100.2 F (37.9 C)] 98.7 F (37.1 C) (11/28 0806) Pulse Rate:  [64-84] 66 (11/28 0806) Resp:  [16-18] 16 (11/28 0806) BP: (130-141)/(88-96) 140/95 (11/28 0806) SpO2:  [100 %] 100 % (11/28 0806) Last BM Date : 01/15/22  Intake/Output from previous day: 11/27 0701 - 11/28 0700 In: 440 [P.O.:440] Out: 375 [Urine:350; Drains:25] Intake/Output this shift: No intake/output data recorded.  PE: General: NAD Resp: normal work of breathing Abd/GU: dressings in place, foley draining clear yellow urine  Lab Results:  No results for input(s): "WBC", "HGB", "HCT", "PLT" in the last 72 hours.  BMET Recent Labs    01/14/22 0248 01/16/22 0328  NA 141 143  K 3.5 3.0*  CL 104 105  CO2 28 31  GLUCOSE 126* 169*  BUN 8 9  CREATININE 0.99 0.99  CALCIUM 9.4 9.5    PT/INR No results for input(s): "LABPROT", "INR" in the last 72 hours. CMP     Component Value Date/Time   NA 143 01/16/2022 0328   NA 137 03/08/2021 1137   K 3.0 (L) 01/16/2022 0328   CL 105 01/16/2022 0328   CO2 31 01/16/2022 0328   GLUCOSE 169 (H) 01/16/2022 0328   BUN 9 01/16/2022 0328   BUN 17 03/08/2021 1137   CREATININE 0.99 01/16/2022 0328   CALCIUM 9.5 01/16/2022 0328   PROT 7.7 01/05/2022 0900   PROT 7.9 03/08/2021 1137   ALBUMIN 2.6 (L) 01/05/2022 0900   ALBUMIN 3.9 03/08/2021 1137   AST 16 01/05/2022 0900   ALT 12 01/05/2022 0900   ALKPHOS 71 01/05/2022 0900   BILITOT 0.6 01/05/2022 0900   BILITOT 0.2 03/08/2021 1137   GFRNONAA >60 01/16/2022 0328   GFRAA >60 07/20/2019 0550   Lipase  No results found for: "LIPASE"     Studies/Results: CT HEAD WO CONTRAST (5MM)  Result Date: 01/15/2022 CLINICAL DATA:  Head trauma, fall. EXAM: CT HEAD WITHOUT CONTRAST TECHNIQUE: Contiguous axial images were  obtained from the base of the skull through the vertex without intravenous contrast. RADIATION DOSE REDUCTION: This exam was performed according to the departmental dose-optimization program which includes automated exposure control, adjustment of the mA and/or kV according to patient size and/or use of iterative reconstruction technique. COMPARISON:  01/03/2022. FINDINGS: Brain: No acute intracranial hemorrhage, midline shift or mass effect. No extra-axial fluid collection. Mild diffuse atrophy is noted. Mild periventricular white matter hypodensities bilaterally. No hydrocephalus. Vascular: No hyperdense vessel or unexpected calcification. Skull: Normal. A stable extra-axial calcified lesion is noted over the frontal lobe on the left. Sinuses/Orbits: No acute finding. Other: None. IMPRESSION: Stable head CT with no acute intracranial hemorrhage. Electronically Signed   By: Brett Fairy M.D.   On: 01/15/2022 05:05    Anti-infectives: Anti-infectives (From admission, onward)    Start     Dose/Rate Route Frequency Ordered Stop   01/15/22 2200  cefadroxil (DURICEF) capsule 1,000 mg        1,000 mg Oral 2 times daily 01/15/22 1623     01/15/22 2200  metroNIDAZOLE (FLAGYL) tablet 500 mg        500 mg Oral Every 12 hours 01/15/22 1623     01/12/22 1230  piperacillin-tazobactam (ZOSYN) IVPB 3.375 g  Status:  Discontinued        3.375 g 12.5 mL/hr  over 240 Minutes Intravenous Every 8 hours 01/12/22 1139 01/15/22 1622   01/11/22 1400  ceFAZolin (ANCEF) IVPB 2g/100 mL premix  Status:  Discontinued        2 g 200 mL/hr over 30 Minutes Intravenous Every 8 hours 01/10/22 1330 01/12/22 1007   01/08/22 1400  cefTRIAXone (ROCEPHIN) 2 g in sodium chloride 0.9 % 100 mL IVPB  Status:  Discontinued        2 g 200 mL/hr over 30 Minutes Intravenous Every 24 hours 01/08/22 1250 01/10/22 1330   01/07/22 1500  piperacillin-tazobactam (ZOSYN) IVPB 3.375 g  Status:  Discontinued        3.375 g 12.5 mL/hr over 240  Minutes Intravenous Every 8 hours 01/07/22 1357 01/08/22 1244   01/06/22 1100  vancomycin (VANCOCIN) IVPB 1000 mg/200 mL premix  Status:  Discontinued        1,000 mg 200 mL/hr over 60 Minutes Intravenous Every 24 hours 01/05/22 0946 01/05/22 0949   01/06/22 0000  vancomycin variable dose per unstable renal function (pharmacist dosing)  Status:  Discontinued         Does not apply See admin instructions 01/05/22 0949 01/05/22 1514   01/05/22 2200  ceFEPIme (MAXIPIME) 2 g in sodium chloride 0.9 % 100 mL IVPB  Status:  Discontinued        2 g 200 mL/hr over 30 Minutes Intravenous Every 12 hours 01/05/22 0946 01/05/22 1514   01/05/22 2000  piperacillin-tazobactam (ZOSYN) IVPB 3.375 g  Status:  Discontinued        3.375 g 12.5 mL/hr over 240 Minutes Intravenous Every 8 hours 01/05/22 1711 01/07/22 1250   01/05/22 1515  linezolid (ZYVOX) IVPB 600 mg  Status:  Discontinued        600 mg 300 mL/hr over 60 Minutes Intravenous Every 12 hours 01/05/22 1514 01/08/22 1244   01/05/22 0945  ceFEPIme (MAXIPIME) 2 g in sodium chloride 0.9 % 100 mL IVPB        2 g 200 mL/hr over 30 Minutes Intravenous  Once 01/05/22 0932 01/05/22 1038   01/05/22 0945  metroNIDAZOLE (FLAGYL) IVPB 500 mg        500 mg 100 mL/hr over 60 Minutes Intravenous  Once 01/05/22 0932 01/05/22 1103   01/05/22 0945  vancomycin (VANCOCIN) IVPB 1000 mg/200 mL premix  Status:  Discontinued        1,000 mg 200 mL/hr over 60 Minutes Intravenous  Once 01/05/22 0932 01/05/22 0944   01/05/22 0945  vancomycin (VANCOREADY) IVPB 2000 mg/400 mL        2,000 mg 200 mL/hr over 120 Minutes Intravenous  Once 01/05/22 0944 01/05/22 1256        Assessment/Plan 71 yo male with 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  s/p debridement, POD2 second look debridement by Dr. Zenia Resides and Dr. Cain Sieve 11/17 and 11/19 POD7 wound exploration and washout 11/21 Dr. 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

## 2022-01-16 NOTE — Progress Notes (Cosign Needed Addendum)
Subjective:  The patient denies any pain today, and reports improved muscle spasms. He pain is also manageable during dressing changes, around 3/10. His last bowel movement was this morning, and it was normal. He has been attempting to eat more, and he had already eaten some of his breakfast this morning. Discussed plan for plastic surgery wound closure later this week and SNF placement. He is agreeable to the plan.   Objective:  Vital signs in last 24 hours: Vitals:   01/15/22 1727 01/15/22 2129 01/16/22 0603 01/16/22 0806  BP: 130/88 (!) 141/94 (!) 140/96 (!) 140/95  Pulse: 84 77 64 66  Resp: '18 18  16  '$ Temp: 99.5 F (37.5 C) 100.2 F (37.9 C) 98.9 F (37.2 C) 98.7 F (37.1 C)  TempSrc: Oral Oral Oral Oral  SpO2: 100% 100% 100% 100%  Weight:      Height:       Weight change:   Intake/Output Summary (Last 24 hours) at 01/16/2022 1353 Last data filed at 01/16/2022 1009 Gross per 24 hour  Intake 340 ml  Output 415 ml  Net -75 ml   General: Appears to be resting comfortably in bed; No acute distress Cardiovascular: RRR; no murmurs, rubs, or gallops; 2+ radial pulses Pulmonary: Normal respiratory effort Skin: Warm and dry; slight decrease in skin turgor, Wound dressing over perineum, no visible swelling, erythema, bleeding, or drainage beyond the dressing  MSK: Bilateral heels with no tenderness, skin breakdown, or bogginess; darker skin to left heel appears to be peeling dry skin  Neuro: A&O x4; answering questions appropriately   Assessment/Plan:  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 Active Problems:   Paraplegia ([place])   Neurogenic bladder   Spasticity   Diabetes mellitus type 2 in nonobese ([place])  Timothy Davenport is a 71 y.o. with a pertinent PMH of type 2 diabetes and paraplegia, who presented with worsenign scrotal infection and admitted for fournier gangrene while on an SGLT-2i. He is status post 2 debridements and IR guided drain placement of  pelvic abscess.   Fournier gangrene s/p debridement x2 Right seminal vesicle abscess s/p IR drain placement The patient remains hemodynamically stable and his pain is well controlled. ID recommended PO Cefadroxil/flagyl for 4 more weeks, with ID follow-up in 2-3 weeks. He is on Cefadroxil/flagyl day 2, total antibiotics day 12. Plastic surgery consulted and plans for closure Thursday or Friday of this week. They anticipate future operative interventions after SNF placement. SW still working on coordinating with the [place]. The patient has been accepted at [facility] once he is medically cleared. RD recommends appetite stimulant, so will order mirtazapine qhs for both appetite and sleep.  - Appreciate surgical team, ID, and plastics assistance in his care - plastic surgery scheduled 12/1 with Dr. Lovena Le - Cefadroxil and flagyl day 2, total antibiotics course day 12 - Mirtazapine 15 mg qhs for appetite and sleep - Pain management with tylenol, oxycodone, and dilaudid PRN - Daily wet-to-dry dressings - Diet supplements given - Encourage nutrition   Hospital delirium  The patient is A&O x 4 today and answers questions appropriately, which is improved from yesterday. Will continue delirium precautions. The patient is reportedly more active at night, so will order mirtazapine qhs to aid with sleep. - Mirtazapine as above - Delirium precautions   Hypophosphatemia Hypokalemia BMP shows decreased potassium of 3.0 and phosphorous of 2.0. The patient has had persistent low appetite since admission, but reports he is attempting to eat more. Plan  to replete with K phos and add on mirtazapine to stimulate appetite to avoid further electrolyte derangements. Will recheck BMP Thursday, 11/30.  - IV potassium phosphate 15 mmol  - Mirtazapine as above - Encourage PO  Insulin dependent type 2 diabetes mellitus CBG within goal -novolog SSI   Chronic medical conditions Bladder spasms - continue  dantrolene Depression - continue zoloft HLD - continue simvastatin   LOS: 11 days   Timothy Davenport, Medical Student 01/16/2022, 1:53 PM   Attestation for Student Documentation:  I personally was present and performed or re-performed the history, physical exam and medical decision-making activities of this service and have verified that the service and findings are accurately documented in the student's note.  [name], DO 01/16/2022, 4:01 PM

## 2022-01-16 NOTE — TOC Progression Note (Signed)
Transition of Care (TOC) - Progression Note   Patient discussed in [place] meeting with [place] [facility] and supervisor Zara Council.  Patient Details  Name: Timothy Davenport MRN: [id number] Date of Birth: 01/15/1951  Transition of Care [facility]) CM/SW Contact  Calab Sachse, Edson Snowball, RN Phone Number: 01/16/2022, 3:54 PM  Clinical Narrative:       Expected Discharge Plan: [place] Barriers to Discharge: Continued Medical Work up  Expected Discharge Plan and Services Expected Discharge Plan: [place] In-house Referral: Clinical Social Work     Living arrangements for the past 2 months: Single Family Home                                       Social Determinants of Health (SDOH) Interventions    Readmission Risk Interventions    04/26/2021    1:38 PM  Readmission Risk Prevention Plan  Post Dischage Appt Complete  Medication Screening Complete  Transportation Screening Complete

## 2022-01-16 NOTE — Progress Notes (Signed)
  Progress Note   Date: 01/15/2022  Patient Name: Timothy Davenport        MRN#: [id number]  Hypophosphatemia

## 2022-01-16 NOTE — Consult Note (Signed)
Reason for Consult: Management of open wounds Referring Physician: Dr. Matthew Folks is an 71 y.o. male.  HPI: Timothy Davenport underwent debridement for Fournier's gangrene approximately 10 days ago.  He is has returned to the operating room for washout and redebridements and his wounds are currently clean and stable.  I was consulted for management of the wounds.  Past Medical History:  Diagnosis Date   Colon polyp    Diabetes mellitus without complication ([place])    Diabetic neuropathy ([place])    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   HTN (hypertension)    Patella fracture    Renal calculi     Past Surgical History:  Procedure Laterality Date   I & D EXTREMITY N/A 01/09/2022   Procedure: EXCISIONAL DEBRIDEMENT BILATERAL GROIN AND LOWER ABDOMEN;  Surgeon: Erroll Luna, MD;  Location: [place];  Service: General;  Laterality: N/A;   IRRIGATION AND DEBRIDEMENT ABSCESS N/A 01/05/2022   Procedure: IRRIGATION AND DEBRIDEMENT NECROTIZING  TISSUE RECTAL ABSCESS;  Surgeon: Vira Agar, MD;  Location: [place];  Service: Urology;  Laterality: N/A;   LUMBAR LAMINECTOMY/DECOMPRESSION MICRODISCECTOMY N/A 07/01/2019   Procedure: LUMBAR LAMINECTOMY/DECOMPRESSION MICRODISCECTOMY 1 LEVEL, THORACIC SIX-SEVEN;  Surgeon: Consuella Lose, MD;  Location: [place];  Service: Neurosurgery;  Laterality: N/A;  LUMBAR LAMINECTOMY/DECOMPRESSION MICRODISCECTOMY 1 LEVEL, THORACIC SIX-SEVEN    ORIF PATELLA     SCROTAL EXPLORATION N/A 01/07/2022   Procedure: DEBRIDEMENT OF PENIS, SCROTUM, TESTICLES, AND GROIN;  Surgeon: Vira Agar, MD;  Location: [place];  Service: Urology;  Laterality: N/A;   THORACIC DISCECTOMY N/A 07/03/2019   Procedure: Evacuation of Thoracic Hematoma;  Surgeon: Consuella Lose, MD;  Location: [place];  Service: Neurosurgery;  Laterality: N/A;   WOUND DEBRIDEMENT N/A 01/07/2022   Procedure: DEBRIDEMENT OF GROIN AND ABDOMINAL WALL;  Surgeon: Dwan Bolt, MD;  Location:  [place];  Service: General;  Laterality: N/A;    Family History  Problem Relation Age of Onset   Hypertension Mother    Hypertension Father     Social History:  reports that he has never smoked. He has never used smokeless tobacco. He reports current alcohol use. He reports current drug use. Drug: Marijuana.  Allergies:  Allergies  Allergen Reactions   Jardiance [Empagliflozin] Other (See Comments)    Fournier Gangrene 2023   Lisinopril Other (See Comments)    Angioedema   Enalapril-Hctz [Enalapril-Hydrochlorothiazide] Rash    Medications: I have reviewed the patient's current medications.  Results for orders placed or performed during the hospital encounter of 01/05/22 (from the past 48 hour(s))  Glucose, capillary     Status: Abnormal   Collection Time: 01/14/22 12:27 PM  Result Value Ref Range   Glucose-Capillary 174 (H) 70 - 99 mg/dL    Comment: Glucose reference range applies only to samples taken after fasting for at least 8 hours.  Glucose, capillary     Status: Abnormal   Collection Time: 01/14/22  4:07 PM  Result Value Ref Range   Glucose-Capillary 190 (H) 70 - 99 mg/dL    Comment: Glucose reference range applies only to samples taken after fasting for at least 8 hours.  Glucose, capillary     Status: Abnormal   Collection Time: 01/14/22 10:17 PM  Result Value Ref Range   Glucose-Capillary 148 (H) 70 - 99 mg/dL    Comment: Glucose reference range applies only to samples taken after fasting for at least 8 hours.  Glucose, capillary  Status: Abnormal   Collection Time: 01/15/22  8:10 AM  Result Value Ref Range   Glucose-Capillary 155 (H) 70 - 99 mg/dL    Comment: Glucose reference range applies only to samples taken after fasting for at least 8 hours.  Glucose, capillary     Status: Abnormal   Collection Time: 01/15/22 12:21 PM  Result Value Ref Range   Glucose-Capillary 164 (H) 70 - 99 mg/dL    Comment: Glucose reference range applies only to samples taken  after fasting for at least 8 hours.  Glucose, capillary     Status: Abnormal   Collection Time: 01/15/22  5:26 PM  Result Value Ref Range   Glucose-Capillary 118 (H) 70 - 99 mg/dL    Comment: Glucose reference range applies only to samples taken after fasting for at least 8 hours.  Phosphorus     Status: Abnormal   Collection Time: 01/16/22  3:28 AM  Result Value Ref Range   Phosphorus 2.0 (L) 2.5 - 4.6 mg/dL    Comment: Performed at [facility] [address], [place], [place] [address]  Magnesium     Status: None   Collection Time: 01/16/22  3:28 AM  Result Value Ref Range   Magnesium 2.0 1.7 - 2.4 mg/dL    Comment: Performed at [facility] [address] [place], [place] [address]  Basic metabolic panel     Status: Abnormal   Collection Time: 01/16/22  3:28 AM  Result Value Ref Range   Sodium 143 135 - 145 mmol/L   Potassium 3.0 (L) 3.5 - 5.1 mmol/L   Chloride 105 98 - 111 mmol/L   CO2 31 22 - 32 mmol/L   Glucose, Bld 169 (H) 70 - 99 mg/dL    Comment: Glucose reference range applies only to samples taken after fasting for at least 8 hours.   BUN 9 8 - 23 mg/dL   Creatinine, Ser 0.99 0.61 - 1.24 mg/dL   Calcium 9.5 8.9 - 10.3 mg/dL   GFR, Estimated >60 >60 mL/min    Comment: (NOTE) Calculated using the CKD-EPI Creatinine Equation (2021)    Anion gap 7 5 - 15    Comment: Performed at [facility] [address] [place], [name] [address]  Glucose, capillary     Status: Abnormal   Collection Time: 01/16/22  8:30 AM  Result Value Ref Range   Glucose-Capillary 125 (H) 70 - 99 mg/dL    Comment: Glucose reference range applies only to samples taken after fasting for at least 8 hours.   Comment 1 Notify RN      ROS Blood pressure (!) 140/95, pulse 66, temperature 98.7 F (37.1 C), temperature source Oral, resp. rate 16, height '5\' 11"'$  (1.803 m), weight 77.1 kg, SpO2 100 %. Physical Exam  Assessment/Plan: On evaluation both of the patient's testicles  are exposed most of the scrotum has been removed.  The penile skin has been debrided.  The lower abdominal wall has been opened and debrided with the left inguinal contents exposed to the abdominal wall. The patient will require multiple stage closure for his wounds.  I will plan on the first stage while he is in the hospital most likely Thursday or Friday.  This will include closure of the lower abdominal wounds.  Skin grafting of the testicles and penis will be performed at a second or third stage.  Camillia Herter, MD 01/16/2022, 9:13 AM

## 2022-01-16 NOTE — TOC Progression Note (Signed)
Transition of Care [facility]) - Progression Note    Patient Details  Name: Timothy Davenport MRN: [id number] Date of Birth: 08-03-1950  Transition of Care [facility]) CM/SW [place], [place] Phone Number: 01/16/2022, 11:01 AM  Clinical Narrative:     CSW received message from Cole with [facility]. Janie confirmed SNF bed for patient when medically ready. CSW informed MD. CSW returned patients spouse phone call and updated patients spouse on SNF placement for patient. All questions answered. No further questions reported at this time. CSW will continue to follow and assist with patients dc planning needs.  Expected Discharge Plan: [place] Barriers to Discharge: Continued Medical Work up  Expected Discharge Plan and Services Expected Discharge Plan: [place] In-house Referral: Clinical Social Work     Living arrangements for the past 2 months: Single Family Home                                       Social Determinants of Health (SDOH) Interventions    Readmission Risk Interventions    04/26/2021    1:38 PM  Readmission Risk Prevention Plan  Post Dischage Appt Complete  Medication Screening Complete  Transportation Screening Complete

## 2022-01-16 NOTE — Progress Notes (Signed)
Occupational Therapy Treatment Patient Details Name: Timothy Davenport MRN: [id number] DOB: 1950-04-24 Today's Date: 01/16/2022   History of present illness Pt is a 71 yo male admitted for gangrene of the scrotum.  Pt with excisional debridement of B groin and lower abdomen. Third I&D of wound on 11/21.  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 PHM: T8 incomplete paraplegia with neurogenic bladder.   OT comments  Patient received in supine with nursing tech assisting patient with self care. Patient encouraged to participate with UB and LB bathing and assisted with rolling. Patient was mod assist to get to EOB and addressed sitting balance with reaching tasks with patient requiring min assist to min guard assist for balance when not using one extremity for support. Attempted to stand with face to face technique to allow patient to move hips closer to [facility] and avoid scooting. Patient was max assist with patient able to stand enough to move hips to left. Patient was mod assist to return to supine and was provided setup for breakfast. Acute OT to continue to follow.    Recommendations for follow up therapy are one component of a multi-disciplinary discharge planning process, led by the attending physician.  Recommendations may be updated based on patient status, additional functional criteria and insurance authorization.    Follow Up Recommendations  Skilled nursing-short term rehab (<3 hours/day)     Assistance Recommended at Discharge Frequent or constant Supervision/Assistance  Patient can return home with the following  Two people to help with walking and/or transfers;A lot of help with bathing/dressing/bathroom;Assistance with cooking/housework;Assist for transportation;Help with stairs or ramp for entrance   Equipment Recommendations  None recommended by OT    Recommendations for Other Services      Precautions / Restrictions Precautions Precautions: Fall;Other (comment) (wound care) Precaution  Comments: significant open abdominal/groin wound Required Braces or Orthoses: Other Brace (patient with significant dressing to groin area) Other Brace: bulky dressing to groin area held in place with mesh underwear Restrictions Weight Bearing Restrictions: No Other Position/Activity Restrictions: Avoid shearing during transfers and bed mobility due to groin debridement.       Mobility Bed Mobility Overal bed mobility: Needs Assistance Bed Mobility: Rolling, Sidelying to Sit, Sit to Supine Rolling: Mod assist Sidelying to sit: Mod assist   Sit to supine: Mod assist   General bed mobility comments: required assistance with BLEs and use of bed pads to scoot hips towards EOB    Transfers Overall transfer level: Needs assistance   Transfers: Sit to/from Stand Sit to Stand: Max assist           General transfer comment: attempted stand from EOB with face to face technique with partial stand and able to move hips towards HOB     Balance Overall balance assessment: Needs assistance Sitting-balance support: Feet supported, Single extremity supported Sitting balance-Leahy Scale: Fair Sitting balance - Comments: difficulty maintianing sitting balance without one extremity support                                   ADL either performed or assessed with clinical judgement   ADL Overall ADL's : Needs assistance/impaired     Grooming: Wash/dry hands;Wash/dry face;Supervision/safety;Bed level       Lower Body Bathing: Moderate assistance;Bed level   Upper Body Dressing : Minimal assistance;Sitting Upper Body Dressing Details (indicate cue type and reason): to donn gown  General ADL Comments: assisted nursing tech with self care    Extremity/Trunk Assessment              Vision       Perception     Praxis      Cognition Arousal/Alertness: Awake/alert Behavior During Therapy: WFL for tasks assessed/performed Overall  Cognitive Status: Within Functional Limits for tasks assessed                                 General Comments: alert and oriented x3        Exercises      Shoulder Instructions       General Comments      Pertinent Vitals/ Pain       Pain Assessment Pain Assessment: No/denies pain Pain Intervention(s): Monitored during session  Home Living                                          Prior Functioning/Environment              Frequency  Min 2X/week        Progress Toward Goals  OT Goals(current goals can now be found in the care plan section)  Progress towards OT goals: Progressing toward goals  Acute Rehab OT Goals Patient Stated Goal: get better OT Goal Formulation: With patient Time For Goal Achievement: 01/24/22 Potential to Achieve Goals: Fair ADL Goals Pt Will Perform Lower Body Bathing: with min assist;sitting/lateral leans Pt Will Perform Lower Body Dressing: with supervision;sitting/lateral leans;bed level Pt Will Transfer to Toilet: with min assist;squat pivot transfer;bedside commode Pt Will Perform Toileting - Clothing Manipulation and hygiene: sitting/lateral leans;with min assist Additional ADL Goal #1: Pt will independently state the reasons why cleaning up directly after bowel movement and boosting during trasnfers are important to his wound care routine.  Plan Discharge plan remains appropriate    Co-evaluation                 AM-PAC OT "6 Clicks" Daily Activity     Outcome Measure   Help from another person eating meals?: None Help from another person taking care of personal grooming?: None Help from another person toileting, which includes using toliet, bedpan, or urinal?: Total Help from another person bathing (including washing, rinsing, drying)?: A Lot Help from another person to put on and taking off regular upper body clothing?: A Little Help from another person to put on and taking off  regular lower body clothing?: A Lot 6 Click Score: 16    End of Session    OT Visit Diagnosis: Unsteadiness on feet (R26.81)   Activity Tolerance Patient tolerated treatment well   Patient Left in bed;with call bell/phone within reach;with bed alarm set   Nurse Communication Mobility status        Time: 2831-5176 OT Time Calculation (min): 29 min  Charges: OT General Charges $OT Visit: 1 Visit OT Treatments $Self Care/Home Management : 8-22 mins $Therapeutic Activity: 8-22 mins  Lodema Hong, [place]  Office [place] 01/16/2022, 12:04 PM

## 2022-01-17 DIAGNOSIS — B9689 Other specified bacterial agents as the cause of diseases classified elsewhere: Secondary | ICD-10-CM | POA: Diagnosis not present

## 2022-01-17 DIAGNOSIS — N490 Inflammatory disorders of seminal vesicle: Secondary | ICD-10-CM | POA: Diagnosis not present

## 2022-01-17 DIAGNOSIS — N493 Fournier gangrene: Secondary | ICD-10-CM | POA: Diagnosis not present

## 2022-01-17 LAB — GLUCOSE, CAPILLARY
Glucose-Capillary: 108 mg/dL — ABNORMAL HIGH (ref 70–99)
Glucose-Capillary: 139 mg/dL — ABNORMAL HIGH (ref 70–99)
Glucose-Capillary: 139 mg/dL — ABNORMAL HIGH (ref 70–99)
Glucose-Capillary: 204 mg/dL — ABNORMAL HIGH (ref 70–99)

## 2022-01-17 LAB — BASIC METABOLIC PANEL
Anion gap: 7 (ref 5–15)
BUN: 7 mg/dL — ABNORMAL LOW (ref 8–23)
CO2: 29 mmol/L (ref 22–32)
Calcium: 8.9 mg/dL (ref 8.9–10.3)
Chloride: 103 mmol/L (ref 98–111)
Creatinine, Ser: 0.93 mg/dL (ref 0.61–1.24)
GFR, Estimated: >60 mL/min (ref >60)
Glucose, Bld: 114 mg/dL — ABNORMAL HIGH (ref 70–99)
Potassium: 2.6 mmol/L — CL (ref 3.5–5.1)
Sodium: 139 mmol/L (ref 135–145)

## 2022-01-17 LAB — CBC
HCT: 26.1 % — ABNORMAL LOW (ref 39.0–52.0)
Hemoglobin: 8 g/dL — ABNORMAL LOW (ref 13.0–17.0)
MCH: 24.9 pg — ABNORMAL LOW (ref 26.0–34.0)
MCHC: 30.7 g/dL (ref 30.0–36.0)
MCV: 81.3 fL (ref 80.0–100.0)
Platelets: 332 10*3/uL (ref 150–400)
RBC: 3.21 MIL/uL — ABNORMAL LOW (ref 4.22–5.81)
RDW: 17.7 % — ABNORMAL HIGH (ref 11.5–15.5)
WBC: 7.1 10*3/uL (ref 4.0–10.5)
nRBC: 0 % (ref 0.0–0.2)

## 2022-01-17 LAB — PHOSPHORUS: Phosphorus: 2 mg/dL — ABNORMAL LOW (ref 2.5–4.6)

## 2022-01-17 LAB — MAGNESIUM: Magnesium: 1.8 mg/dL (ref 1.7–2.4)

## 2022-01-17 LAB — AEROBIC/ANAEROBIC CULTURE W GRAM STAIN (SURGICAL/DEEP WOUND)

## 2022-01-17 MED ORDER — POTASSIUM PHOSPHATES 15 MMOLE/5ML IV SOLN
30.0000 mmol | Freq: Once | INTRAVENOUS | Status: AC
  Administered 2022-01-17: 30 mmol via INTRAVENOUS
  Filled 2022-01-17: qty 10

## 2022-01-17 MED ORDER — POTASSIUM CHLORIDE CRYS ER 20 MEQ PO TBCR
80.0000 meq | EXTENDED_RELEASE_TABLET | Freq: Once | ORAL | Status: AC
  Administered 2022-01-17: 80 meq via ORAL
  Filled 2022-01-17: qty 4

## 2022-01-17 MED ORDER — POTASSIUM CHLORIDE CRYS ER 20 MEQ PO TBCR: 40.0000 meq | EXTENDED_RELEASE_TABLET | Freq: Two times a day (BID) | ORAL | Status: DC

## 2022-01-17 NOTE — TOC Progression Note (Signed)
Transition of Care [facility]) - Progression Note    Patient Details  Name: Timothy Davenport MRN: [id number] Date of Birth: Jun 29, 1950  [facility]) CM/SW Contact  Carles Collet, RN Phone Number: 01/17/2022, 9:41 AM  Clinical Narrative:     Received call from [place] stating that they would be able to start insurance auth for DC to [facility] if physician agrees he is medically ready. Spoke w patient and explained difference btwn LTACH and SNF and he is agreeable to be considered for LTACH at DC. Secure messaged MD team to update on possible DC option, waiting to hear back.  Expected Discharge Plan: [place] Barriers to Discharge: Continued Medical Work up  Expected Discharge Plan and Services Expected Discharge Plan: [place] In-house Referral: Clinical Social Work     Living arrangements for the past 2 months: Single Family Home                                       Social Determinants of Health (SDOH) Interventions    Readmission Risk Interventions    04/26/2021    1:38 PM  Readmission Risk Prevention Plan  Post Dischage Appt Complete  Medication Screening Complete  Transportation Screening Complete

## 2022-01-17 NOTE — Progress Notes (Signed)
Physical Therapy Treatment Patient Details Name: Timothy Davenport MRN: [id number] DOB: October 03, 1950 Today's Date: 01/17/2022   History of Present Illness Pt is a 71 yo male admitted for gangrene of the scrotum.  Pt with excisional debridement of B groin and lower abdomen. Third I&D of wound on 11/21.  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 PHM: T8 incomplete paraplegia with neurogenic bladder.    PT Comments    Pt received in supine, agreeable to therapy session, pt A&O x3 and noted to have wet/soiled bed linens, pt with good effort for bed mobility and transfers while staff assisting with peri care and linen change. Pt able to stand x3 trials to [facility] and remains standing 10-30 seconds at a time. Pt unable to weight shift in stance due to weakness/fatigue and impulsive to sit when fatigued. Pt dinner arrived during session and he was noted to have refused to eat lunch and dinner, and was appearing nauseated, with frequent clear secretions during session, RN/MD notified. Pt continues to benefit from PT services to progress toward functional mobility goals.    Recommendations for follow up therapy are one component of a multi-disciplinary discharge planning process, led by the attending physician.  Recommendations may be updated based on patient status, additional functional criteria and insurance authorization.  Follow Up Recommendations  Skilled nursing-short term rehab (<3 hours/day) Can patient physically be transported by private vehicle: No   Assistance Recommended at Discharge Frequent or constant Supervision/Assistance  Patient can return home with the following Two people to help with walking and/or transfers;Two people to help with bathing/dressing/bathroom;Direct supervision/assist for medications management;Help with stairs or ramp for entrance;Assist for transportation   Equipment Recommendations  None recommended by PT    Recommendations for Other Services       Precautions /  Restrictions Precautions Precautions: Fall;Other (comment) (wound care) Precaution Comments: significant open abdominal/groin wound Required Braces or Orthoses: Other Brace (patient with significant dressing to groin area) Other Brace: bulky dressing to groin area held in place with mesh underwear, JP drain to R low back/buttock area Restrictions Weight Bearing Restrictions: No Other Position/Activity Restrictions: Avoid shearing during transfers and bed mobility due to groin debridement.     Mobility  Bed Mobility Overal bed mobility: Needs Assistance Bed Mobility: Rolling, Sidelying to Sit, Sit to Supine Rolling: Mod assist, Min assist Sidelying to sit: Mod assist, +2 for safety/equipment   Sit to supine: Mod assist, +2 for physical assistance   General bed mobility comments: dense cues for log rolling, pt upon return to supine impulsive to lay trunk back and needed +2 for proper sequencing of trunk/LE to reduce risk of shearing to backside and to protect abd incision/dressing area    Transfers Overall transfer level: Needs assistance Equipment used: Ambulation equipment used Transfers: Sit to/from Stand Sit to Stand: Max assist, +2 physical assistance, From elevated surface     Squat pivot transfers: +2 physical assistance, Max assist     General transfer comment: elevated bed>Stedy x3 trials, pt able to squat to [facility] on third trial to swing his hips toward L side (HOB) to prevent need for scooting along bed which would risk shearing.    Ambulation/Gait               General Gait Details: pt unable   Stairs             Wheelchair Mobility    Modified Rankin (Stroke Patients Only)       Balance Overall balance assessment: Needs  assistance Sitting-balance support: Feet supported, Single extremity supported, Bilateral upper extremity supported Sitting balance-Leahy Scale: Fair Sitting balance - Comments: difficulty maintianing sitting balance without  one extremity support   Standing balance support: During functional activity, Bilateral upper extremity supported, Reliant on assistive device for balance Standing balance-Leahy Scale: Zero Standing balance comment: requires outside support however with support from Stedy able to improve knee extension; pt tending to crouch after standing 5-10 seconds then impulsive to sit unless he has maxA +2 to totalA to maintain upright                            Cognition Arousal/Alertness: Awake/alert Behavior During Therapy: WFL for tasks assessed/performed Overall Cognitive Status: Within Functional Limits for tasks assessed                                 General Comments: alert and oriented to DOB, situation, location. Pt reports no nausea but did not eat his lunch and refusing to eat dinner, pt frequently bringing up mucus and spitting into blue bag; RN/MD notified. Pt following 1-step commands well but not following 2-step commands.        Exercises Other Exercises Other Exercises: seated BLE AROM: heel/toe raises (minimal ROM, unclear baseline ankle strength given incomplete SCI)x 10 attempts; S/L  LAQ (gravity eliminated position) and hip flexion x5 reps ea Other Exercises: STS x 3 trials from elevated bed<>Stedy    General Comments General comments (skin integrity, edema, etc.): Pt with soiled and wet bed pads and sheets, NT/RN called to room to assist with bed linen stripping and peri care while pt standing in [place]. Pt refusing to eat dinner and at 0% of his lunch; Pt with excess mucus/spitting into blue bag but denies nausea; pt appears nauseated. Pt refusing drinks throughout session when offered them.      Pertinent Vitals/Pain Pain Assessment Pain Assessment: 0-10 Pain Score: 3  Pain Location: wound areas Pain Descriptors / Indicators: Discomfort Pain Intervention(s): Monitored during session, Repositioned           PT Goals (current goals can now be  found in the care plan section) Acute Rehab PT Goals PT Goal Formulation: With patient Time For Goal Achievement: 01/24/22 Progress towards PT goals: Progressing toward goals    Frequency    Min 3X/week      PT Plan Current plan remains appropriate       AM-PAC PT "6 Clicks" Mobility   Outcome Measure  Help needed turning from your back to your side while in a flat bed without using bedrails?: A Little Help needed moving from lying on your back to sitting on the side of a flat bed without using bedrails?: A Lot Help needed moving to and from a bed to a chair (including a wheelchair)?: A Lot Help needed standing up from a chair using your arms (e.g., wheelchair or bedside chair)?: Total Help needed to walk in hospital room?: Total Help needed climbing 3-5 steps with a railing? : Total 6 Click Score: 10    End of Session Equipment Utilized During Treatment: Gait belt Activity Tolerance: Patient tolerated treatment well Patient left: in bed;with call bell/phone within reach;with bed alarm set;Other (comment) (heels floated and HOB at 30* given pt frequent expectoration and appearing nauseated) Nurse Communication: Mobility status;Need for lift equipment;Precautions;Other (comment) (pt refusing lunch and dinner, pt appears nauseated, frequent secretions  noted during session.) PT Visit Diagnosis: Other abnormalities of gait and mobility (R26.89);Muscle weakness (generalized) (M62.81)     Time: 1583-0940 PT Time Calculation (min) (ACUTE ONLY): 32 min  Charges:  $Therapeutic Exercise: 8-22 mins $Therapeutic Activity: 8-22 mins                     Kaelin Holford P., PTA Acute Rehabilitation Services Secure Chat Preferred 9a-5:30pm Office: [place] 01/17/2022, 6:53 PM

## 2022-01-17 NOTE — Progress Notes (Cosign Needed Addendum)
Subjective:  The patient reports he feels better today, because he is more energetic. His pain is well controlled. He is trying to eat more, and had eaten his breakfast when we arrived. He reports he slept very well last night.  Objective:  Vital signs in last 24 hours: Vitals:   01/16/22 2249 01/17/22 0546 01/17/22 0745 01/17/22 1552  BP: (!) 148/82 135/76 124/88 127/71  Pulse: 76 77 72 74  Resp:   17 16  Temp: 98 F (36.7 C) 98.1 F (36.7 C) 99 F (37.2 C) 99.5 F (37.5 C)  TempSrc:   Oral Oral  SpO2: 99%  100% 100%  Weight:      Height:       Weight change:   Intake/Output Summary (Last 24 hours) at 01/17/2022 1634 Last data filed at 01/17/2022 0316 Gross per 24 hour  Intake --  Output 0 ml  Net 0 ml   General: Appears to be resting comfortably in bed; No acute distress Pulmonary: Normal respiratory effort Abdomen: Soft, non-distended, non-tender; no rebound or guarding; no masses or organomegaly  Skin: Warm and dry; slight decrease in skin turgor, Wound dressing over perineum, no visible swelling, erythema, bleeding, or drainage beyond the dressing  MSK: Bilateral heels with no tenderness, skin breakdown, or bogginess; darker skin to left heel appears to be peeling dry skin  Neuro: A&O x4; answering questions appropriately   Assessment/Plan:  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 with a pertinent PMH of type 2 diabetes and paraplegia, who presented with worsenign scrotal infection and admitted for fournier gangrene while on an SGLT-2i. He is status post 2 debridements and IR guided drain placement of pelvic abscess.    Fournier gangrene s/p debridement x2 Right seminal vesicle abscess s/p IR drain placement Hemodynamically stable. He is on Cefadroxil/flagyl day 3, total antibiotics course day 13.  His partial wound closure procedure with plastic surgery is scheduled for Friday, 12/1. SW still working on coordinating with the [place]. He has been accepted at [facility] SNF. [facility] has identified the patient as a candidate and SW believes they may provide better care for his wounds. Will reach out to the patient and his wife to determine their preference.  - Appreciate surgical team, ID, and plastics assistance in his care - plastic surgery scheduled 12/1 with Dr. Lovena Le - Cefadroxil and flagyl day 3, total antibiotics course day 13 - Mirtazapine 15 mg qhs for appetite and sleep - Pain management with tylenol, oxycodone, and dilaudid PRN - Daily wet-to-dry dressings - Diet supplements given - Encourage nutrition  Hospital delirium  The patient is A&O x 4 today and answers questions appropriately. He reports he slept very well on the Mirtazapine last night. Will continue Mirtazapine and delirium precautions.  - Mirtazapine as above - Delirium precautions    Hypophosphatemia Hypokalemia Potassium is decreased to 2.6, phosphorous down 2.0. Will replete and recheck BMP.  - repeat BMP, magnesium, phosphate   Insulin dependent type 2 diabetes mellitus CBG within goal -novolog SSI   Chronic medical conditions Bladder spasms - continue dantrolene Depression - continue zoloft HLD - continue simvastatin   LOS: 12 days   [name], Medical Student 01/17/2022, 4:34 PM   Attestation for Student Documentation:  I personally was present and performed or re-performed the  history, physical exam and medical decision-making activities of this service and have verified that the service and findings are accurately documented in the student's note.  [name], DO 01/17/2022, 5:16 PM

## 2022-01-17 NOTE — Progress Notes (Signed)
8 Days Post-Op  Subjective: No new complaints. Tolerating dressing changes. Eating breakfast   Objective: Vital signs in last 24 hours: Temp:  [98 F (36.7 C)-99.3 F (37.4 C)] 99 F (37.2 C) (11/29 0745) Pulse Rate:  [66-86] 72 (11/29 0745) Resp:  [16-17] 17 (11/29 0745) BP: (124-148)/(72-95) 124/88 (11/29 0745) SpO2:  [99 %-100 %] 100 % (11/29 0745) Last BM Date : 01/15/22  Intake/Output from previous day: 11/28 0701 - 11/29 0700 In: -  Out: 401 [Urine:400; Emesis/NG output:1] Intake/Output this shift: No intake/output data recorded.  PE: General: NAD Resp: normal work of breathing Abd/GU: dressings in place, JP drain with scant SS. foley draining clear yellow urine  Lab Results:  Recent Labs    01/17/22 0421  WBC 7.1  HGB 8.0*  HCT 26.1*  PLT 332    BMET Recent Labs    01/16/22 0328 01/17/22 0421  NA 143 139  K 3.0* 2.6*  CL 105 103  CO2 31 29  GLUCOSE 169* 114*  BUN 9 7*  CREATININE 0.99 0.93  CALCIUM 9.5 8.9    PT/INR No results for input(s): "LABPROT", "INR" in the last 72 hours. CMP     Component Value Date/Time   NA 139 01/17/2022 0421   NA 137 03/08/2021 1137   K 2.6 (LL) 01/17/2022 0421   CL 103 01/17/2022 0421   CO2 29 01/17/2022 0421   GLUCOSE 114 (H) 01/17/2022 0421   BUN 7 (L) 01/17/2022 0421   BUN 17 03/08/2021 1137   CREATININE 0.93 01/17/2022 0421   CALCIUM 8.9 01/17/2022 0421   PROT 7.7 01/05/2022 0900   PROT 7.9 03/08/2021 1137   ALBUMIN 2.6 (L) 01/05/2022 0900   ALBUMIN 3.9 03/08/2021 1137   AST 16 01/05/2022 0900   ALT 12 01/05/2022 0900   ALKPHOS 71 01/05/2022 0900   BILITOT 0.6 01/05/2022 0900   BILITOT 0.2 03/08/2021 1137   GFRNONAA >60 01/17/2022 0421   GFRAA >60 07/20/2019 0550   Lipase  No results found for: "LIPASE"     Studies/Results: No results found.  Anti-infectives: Anti-infectives (From admission, onward)    Start     Dose/Rate Route Frequency Ordered Stop   01/15/22 2200  cefadroxil  (DURICEF) capsule 1,000 mg        1,000 mg Oral 2 times daily 01/15/22 1623     01/15/22 2200  metroNIDAZOLE (FLAGYL) tablet 500 mg        500 mg Oral Every 12 hours 01/15/22 1623     01/12/22 1230  piperacillin-tazobactam (ZOSYN) IVPB 3.375 g  Status:  Discontinued        3.375 g 12.5 mL/hr over 240 Minutes Intravenous Every 8 hours 01/12/22 1139 01/15/22 1622   01/11/22 1400  ceFAZolin (ANCEF) IVPB 2g/100 mL premix  Status:  Discontinued        2 g 200 mL/hr over 30 Minutes Intravenous Every 8 hours 01/10/22 1330 01/12/22 1007   01/08/22 1400  cefTRIAXone (ROCEPHIN) 2 g in sodium chloride 0.9 % 100 mL IVPB  Status:  Discontinued        2 g 200 mL/hr over 30 Minutes Intravenous Every 24 hours 01/08/22 1250 01/10/22 1330   01/07/22 1500  piperacillin-tazobactam (ZOSYN) IVPB 3.375 g  Status:  Discontinued        3.375 g 12.5 mL/hr over 240 Minutes Intravenous Every 8 hours 01/07/22 1357 01/08/22 1244   01/06/22 1100  vancomycin (VANCOCIN) IVPB 1000 mg/200 mL premix  Status:  Discontinued  1,000 mg 200 mL/hr over 60 Minutes Intravenous Every 24 hours 01/05/22 0946 01/05/22 0949   01/06/22 0000  vancomycin variable dose per unstable renal function (pharmacist dosing)  Status:  Discontinued         Does not apply See admin instructions 01/05/22 0949 01/05/22 1514   01/05/22 2200  ceFEPIme (MAXIPIME) 2 g in sodium chloride 0.9 % 100 mL IVPB  Status:  Discontinued        2 g 200 mL/hr over 30 Minutes Intravenous Every 12 hours 01/05/22 0946 01/05/22 1514   01/05/22 2000  piperacillin-tazobactam (ZOSYN) IVPB 3.375 g  Status:  Discontinued        3.375 g 12.5 mL/hr over 240 Minutes Intravenous Every 8 hours 01/05/22 1711 01/07/22 1250   01/05/22 1515  linezolid (ZYVOX) IVPB 600 mg  Status:  Discontinued        600 mg 300 mL/hr over 60 Minutes Intravenous Every 12 hours 01/05/22 1514 01/08/22 1244   01/05/22 0945  ceFEPIme (MAXIPIME) 2 g in sodium chloride 0.9 % 100 mL IVPB        2  g 200 mL/hr over 30 Minutes Intravenous  Once 01/05/22 0932 01/05/22 1038   01/05/22 0945  metroNIDAZOLE (FLAGYL) IVPB 500 mg        500 mg 100 mL/hr over 60 Minutes Intravenous  Once 01/05/22 0932 01/05/22 1103   01/05/22 0945  vancomycin (VANCOCIN) IVPB 1000 mg/200 mL premix  Status:  Discontinued        1,000 mg 200 mL/hr over 60 Minutes Intravenous  Once 01/05/22 0932 01/05/22 0944   01/05/22 0945  vancomycin (VANCOREADY) IVPB 2000 mg/400 mL        2,000 mg 200 mL/hr over 120 Minutes Intravenous  Once 01/05/22 0944 01/05/22 1256        Assessment/Plan 71 yo male with 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  s/p debridements by Dr. Zenia Resides and Dr. Cain Sieve 11/17 and 11/19 POD7 wound exploration and washout 11/21 Dr. 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

## 2022-01-18 DIAGNOSIS — N493 Fournier gangrene: Secondary | ICD-10-CM

## 2022-01-18 DIAGNOSIS — R410 Disorientation, unspecified: Secondary | ICD-10-CM | POA: Diagnosis not present

## 2022-01-18 LAB — CBC
HCT: 28.7 % — ABNORMAL LOW (ref 39.0–52.0)
Hemoglobin: 8.7 g/dL — ABNORMAL LOW (ref 13.0–17.0)
MCH: 24.8 pg — ABNORMAL LOW (ref 26.0–34.0)
MCHC: 30.3 g/dL (ref 30.0–36.0)
MCV: 81.8 fL (ref 80.0–100.0)
Platelets: 332 10*3/uL (ref 150–400)
RBC: 3.51 MIL/uL — ABNORMAL LOW (ref 4.22–5.81)
RDW: 18.6 % — ABNORMAL HIGH (ref 11.5–15.5)
WBC: 7.7 10*3/uL (ref 4.0–10.5)
nRBC: 0 % (ref 0.0–0.2)

## 2022-01-18 LAB — BASIC METABOLIC PANEL
Anion gap: 9 (ref 5–15)
BUN: 7 mg/dL — ABNORMAL LOW (ref 8–23)
CO2: 27 mmol/L (ref 22–32)
Calcium: 9.5 mg/dL (ref 8.9–10.3)
Chloride: 107 mmol/L (ref 98–111)
Creatinine, Ser: 0.83 mg/dL (ref 0.61–1.24)
GFR, Estimated: >60 mL/min (ref >60)
Glucose, Bld: 160 mg/dL — ABNORMAL HIGH (ref 70–99)
Potassium: 3.5 mmol/L (ref 3.5–5.1)
Sodium: 143 mmol/L (ref 135–145)

## 2022-01-18 LAB — PHOSPHORUS: Phosphorus: 2.2 mg/dL — ABNORMAL LOW (ref 2.5–4.6)

## 2022-01-18 LAB — MAGNESIUM: Magnesium: 1.9 mg/dL (ref 1.7–2.4)

## 2022-01-18 LAB — GLUCOSE, CAPILLARY
Glucose-Capillary: 200 mg/dL — ABNORMAL HIGH (ref 70–99)
Glucose-Capillary: 207 mg/dL — ABNORMAL HIGH (ref 70–99)
Glucose-Capillary: 211 mg/dL — ABNORMAL HIGH (ref 70–99)
Glucose-Capillary: 166 mg/dL — ABNORMAL HIGH (ref 70–99)

## 2022-01-18 MED ORDER — POTASSIUM PHOSPHATES 15 MMOLE/5ML IV SOLN
15.0000 mmol | Freq: Once | INTRAVENOUS | Status: AC
  Administered 2022-01-18: 15 mmol via INTRAVENOUS
  Filled 2022-01-18: qty 5

## 2022-01-18 MED ORDER — CHLORHEXIDINE GLUCONATE CLOTH 2 % EX PADS
6.0000 | MEDICATED_PAD | Freq: Once | CUTANEOUS | Status: AC
  Administered 2022-01-19: 6 via TOPICAL

## 2022-01-18 MED ORDER — ENOXAPARIN SODIUM 40 MG/0.4ML IJ SOSY
40.0000 mg | PREFILLED_SYRINGE | Freq: Every day | INTRAMUSCULAR | Status: DC
  Administered 2022-01-20 – 2022-01-24 (×5): 40 mg via SUBCUTANEOUS
  Filled 2022-01-18 (×5): qty 0.4

## 2022-01-18 MED ORDER — CHLORHEXIDINE GLUCONATE CLOTH 2 % EX PADS
6.0000 | MEDICATED_PAD | Freq: Once | CUTANEOUS | Status: AC
  Administered 2022-01-18: 6 via TOPICAL

## 2022-01-18 NOTE — Progress Notes (Addendum)
Subjective:  The patient reports he feels well today. His pain is well controlled, including during dressing changes. When asked about coughing and spitting up phlegm, he reports he was given a nasty drink yesterday, causing him to want to spit. Denies shortness of breath. His last bowel movement was this morning and it was normal in appearance. He believes he did not want to eat his lunch and dinner yesterday because he did not have his dentures in. He has no questions or concerns about wound closure procedure with plastic surgery tomorrow. His wife has been updated.   Objective:  Vital signs in last 24 hours: Vitals:   01/17/22 2026 01/18/22 0348 01/18/22 0747 01/18/22 1603  BP: 135/81 138/87 116/80 120/85  Pulse: 85 80 84 80  Resp: '20 18 16 17  '$ Temp: 99.3 F (37.4 C) 99 F (37.2 C) (!) 100.4 F (38 C) 98.9 F (37.2 C)  TempSrc: Oral Oral Oral Oral  SpO2: 100% 100% 99% 100%  Weight:      Height:       Weight change:   Intake/Output Summary (Last 24 hours) at 01/18/2022 1652 Last data filed at 01/18/2022 0518 Gross per 24 hour  Intake 130 ml  Output 1470 ml  Net -1340 ml   General: Appears to be resting comfortably in bed; No acute distress  Cardiovascular: Regular rate and rhythm; no murmurs, rubs or gallops; 2+ radial pulses Pulmonary: Normal respiratory effort; lungs clear to auscultation bilaterally; no wheezes, rales, or rhonchi Skin: Warm and dry; slight decrease in skin turgor, Wound dressing over perineum, no visible swelling, erythema, bleeding, or drainage beyond the dressing   Neuro: A&O x4; answering questions appropriately      Assessment/Plan:  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 with a pertinent PMH of type 2 diabetes and paraplegia, who presented with worsenign  scrotal infection and admitted for fournier gangrene while on an SGLT-2i. He is status post 2 debridements and IR guided drain placement of pelvic abscess.     Fournier gangrene s/p debridement x2 Right seminal vesicle abscess s/p IR drain placement The patient remains hemodynamically stable today. He is on Cefadroxil/flagyl day 4, total antibiotics course day 14. Plastic surgery has evaluated the patient today in preparation for his wound closure surgery tomorrow, 12/1. He and his wife have been updated on the disposition choice of SNF vs. LTAC. Will await their decision. - Appreciate surgical team, ID, and plastics assistance in his care - Plastic surgery scheduled 12/1 with Dr. Lovena Le - Cefadroxil and flagyl day 4, total antibiotics course day 14 - Mirtazapine 15 mg qhs for appetite and sleep - Pain management with tylenol, oxycodone, and dilaudid PRN - Daily wet-to-dry dressings - Diet supplements given - Encourage nutrition  Hospital delirium  The patient is A&O x 4 today and answers questions appropriately. He reports he slept very well on the Mirtazapine last night. Will continue Mirtazapine and delirium precautions.  - Mirtazapine as above - Delirium precautions   Hypophosphatemia Hypokalemia Potassium has corrected to 3.5, up from 2.6 yesterday with potassium phosphate. Magnesium 1.9. Phosphorous is still low, but has improved slightly to 2.2, from 2.0 yesterday. Will replete and plan to recheck BMP, phosphorus on 12/2.   - IV potassium phosphate 15 mmol   Insulin  dependent type 2 diabetes mellitus CBG 211 today. His wife brought him more snacks today, so this the most likely reason for this increase. He is currently on SSI. Will continue to observe.  -novolog SSI   Chronic medical conditions Bladder spasms - continue dantrolene Depression - continue zoloft HLD - continue simvastatin    LOS: 13 days   Timothy Davenport, Medical Student 01/18/2022, 4:52 PM   Attestation  for Student Documentation:  I personally was present and performed or re-performed the history, physical exam and medical decision-making activities of this service and have verified that the service and findings are accurately documented in the student's note.  [name], DO 01/18/2022, 6:40 PM

## 2022-01-18 NOTE — Progress Notes (Signed)
Occupational Therapy Treatment Patient Details Name: Timothy Davenport MRN: [id number] DOB: 08-Mar-1950 Today's Date: 01/18/2022   History of present illness Pt is a 71 yo male admitted for gangrene of the scrotum.  Pt with excisional debridement of B groin and lower abdomen. Third I&D of wound on 11/21.  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 PHM: T8 incomplete paraplegia with neurogenic bladder.   OT comments  Patient received in supine and agreeable to OT/PT session. Patient required increased time and max assist of 1 to get to EOB. Patient performed standing from EOB into stedy and from stedy pads with mod assist +2 and increased to max assist of 2 with fatigue. Patient perform grooming tasks seated on EOB with assistance of PT for sitting balance and min assist to perform grooming and gown change. Patient to continue to be followed by Acute OT for self care and functional transfers.    Recommendations for follow up therapy are one component of a multi-disciplinary discharge planning process, led by the attending physician.  Recommendations may be updated based on patient status, additional functional criteria and insurance authorization.    Follow Up Recommendations  Skilled nursing-short term rehab (<3 hours/day)     Assistance Recommended at Discharge Frequent or constant Supervision/Assistance  Patient can return home with the following  Two people to help with walking and/or transfers;A lot of help with bathing/dressing/bathroom;Assistance with cooking/housework;Assist for transportation;Help with stairs or ramp for entrance   Equipment Recommendations  None recommended by OT    Recommendations for Other Services      Precautions / Restrictions Precautions Precautions: Fall;Other (comment) Precaution Comments: significant open abdominal/groin wound Required Braces or Orthoses: Other Brace Other Brace: bulky dressing to groin area held in place with mesh underwear, JP drain to R low  back/buttock area Restrictions Weight Bearing Restrictions: No Other Position/Activity Restrictions: Avoid shearing during transfers and bed mobility due to groin debridement.       Mobility Bed Mobility Overal bed mobility: Needs Assistance Bed Mobility: Supine to Sit, Sit to Supine     Supine to sit: Max assist, HOB elevated Sit to supine: Max assist, HOB elevated   General bed mobility comments: Pt moves BLE to EOB with BUE during supine>sit, requires assistance to elevate BLE onto bed during sit>supine. Able to scoot to [facility] with bed rails & BUE.    Transfers Overall transfer level: Needs assistance   Transfers: Sit to/from Stand Sit to Stand: Max assist, +2 physical assistance, From elevated surface, Mod assist           General transfer comment: Mod assist +2 increasing to max assist +2, cuing to place BLE knees on plate of stedy to block them. Pt is able to pull to standing & clears buttocks but has great difficulty uprighting trunk vs foward lean. STS x 4 times total duirng session. Transfer via Lift Equipment: Stedy   Balance Overall balance assessment: Needs assistance Sitting-balance support: Feet supported, Bilateral upper extremity supported Sitting balance-Leahy Scale: Poor Sitting balance - Comments: CGA increasing to max assist as pt fatigues. Pt with L lateral lean (reports L side is weaker than R). PT attempts to provide cuing/assistance for midline orientation but poor ability for pt to shift to R.   Standing balance support: During functional activity, Bilateral upper extremity supported, Reliant on assistive device for balance Standing balance-Leahy Scale: Zero Standing balance comment: quickly fatigues while standing with forward leaning and reliant on Stedy and therapist for support  ADL either performed or assessed with clinical judgement   ADL Overall ADL's : Needs assistance/impaired     Grooming: Wash/dry  hands;Wash/dry face;Oral care;Minimal assistance;Sitting Grooming Details (indicate cue type and reason): on EOB         Upper Body Dressing : Minimal assistance;Sitting Upper Body Dressing Details (indicate cue type and reason): to donn gown                   General ADL Comments: performed grooming seated on EOB with assistance for sitting balance    Extremity/Trunk Assessment              Vision       Perception     Praxis      Cognition Arousal/Alertness: Awake/alert Behavior During Therapy: WFL for tasks assessed/performed Overall Cognitive Status: Within Functional Limits for tasks assessed                                 General Comments: alert and able to follow commands        Exercises      Shoulder Instructions       General Comments Pt engaged in grooming tasks (brushing teeth/dentures) while sitting EOB.    Pertinent Vitals/ Pain       Pain Assessment Pain Assessment: No/denies pain Pain Intervention(s): Monitored during session  Home Living                                          Prior Functioning/Environment              Frequency  Min 2X/week        Progress Toward Goals  OT Goals(current goals can now be found in the care plan section)  Progress towards OT goals: Progressing toward goals  Acute Rehab OT Goals Patient Stated Goal: get better OT Goal Formulation: With patient Time For Goal Achievement: 01/24/22 Potential to Achieve Goals: Fair ADL Goals Pt Will Perform Lower Body Bathing: with min assist;sitting/lateral leans Pt Will Perform Lower Body Dressing: with supervision;sitting/lateral leans;bed level Pt Will Transfer to Toilet: with min assist;squat pivot transfer;bedside commode Pt Will Perform Toileting - Clothing Manipulation and hygiene: sitting/lateral leans;with min assist Additional ADL Goal #1: Pt will independently state the reasons why cleaning up directly after  bowel movement and boosting during trasnfers are important to his wound care routine.  Plan Discharge plan remains appropriate    Co-evaluation    PT/OT/SLP Co-Evaluation/Treatment: Yes Reason for Co-Treatment: For patient/therapist safety;To address functional/ADL transfers PT goals addressed during session: Mobility/safety with mobility;Balance OT goals addressed during session: ADL's and self-care      AM-PAC OT "6 Clicks" Daily Activity     Outcome Measure   Help from another person eating meals?: None Help from another person taking care of personal grooming?: None Help from another person toileting, which includes using toliet, bedpan, or urinal?: Total Help from another person bathing (including washing, rinsing, drying)?: A Lot Help from another person to put on and taking off regular upper body clothing?: A Little Help from another person to put on and taking off regular lower body clothing?: A Lot 6 Click Score: 16    End of Session Equipment Utilized During Treatment: Other (comment) Charlaine Dalton)  OT Visit Diagnosis: Unsteadiness on feet (R26.81)   Activity Tolerance  Patient tolerated treatment well   Patient Left in bed;with call bell/phone within reach;with bed alarm set   Nurse Communication Mobility status        Time: 5872-7618 OT Time Calculation (min): 31 min  Charges: OT General Charges $OT Visit: 1 Visit OT Treatments $Self Care/Home Management : 8-22 mins  Lodema Hong, [place]  Office [employer] 01/18/2022, 1:52 PM

## 2022-01-18 NOTE — TOC Progression Note (Signed)
Transition of Care [facility]) - Progression Note    Patient Details  Name: Timothy Davenport MRN: [id number] Date of Birth: Jun 28, 1950  [facility]) CM/SW Contact  Jacalyn Lefevre Edson Snowball, RN Phone Number: 01/18/2022, 1:45 PM  Clinical Narrative:     Patient discussed in [place] meeting today with [facility] leadership Zack and Michela Pitcher with [facility] called this am , asking when patient would be medically ready for discharge. She can offer a bed , however would need insurance authorization through [place] first.   Per NCM note yesterday patient in agreement with [facility]   Expected Discharge Plan: Skilled Nursing Facility Barriers to Discharge: Continued Medical Work up  Expected Discharge Plan and Services Expected Discharge Plan: [place] In-house Referral: Clinical Social Work     Living arrangements for the past 2 months: Single Family Home                                       Social Determinants of Health (SDOH) Interventions    Readmission Risk Interventions    04/26/2021    1:38 PM  Readmission Risk Prevention Plan  Post Dischage Appt Complete  Medication Screening Complete  Transportation Screening Complete

## 2022-01-18 NOTE — Progress Notes (Signed)
Subjective: Patient is a pleasant 71 year old male with PMH significant for IDT2DM admitted to the hospital for management of Fournier's gangrene of the scrotum and perineum s/p multiple debridements and IR guided drain placement for pelvic abscess.  He was seen for initial consult by Dr. Lovena Le 01/15/2022 for consideration of surgical options for wound management.  Reviewed photos obtained and the excision site appears clean.  Dr. Lovena Le discussed surgical management in a staged fashion with the patient.  Plan is to attempt primary closure tomorrow the operating room in the inguinal regions and then likely wound matrix placement followed by Sorbact.  He will continue to need daily dressing changes postoperatively.  Once there is considerable granular tissue around the testicles and penis, could discuss subsequent intervention via graft or skin graft substitute.  Today, patient states that he is feeling well.  He was noted to be febrile this morning at 100.4 F, but has since improved.  Vitals are otherwise WNL.  No leukocytosis on most recent labs.  Anemia of chronic disease.  Objective: Vital signs in last 24 hours: Temp:  [99 F (37.2 C)-100.4 F (38 C)] 100.4 F (38 C) (11/30 0747) Pulse Rate:  [74-85] 84 (11/30 0747) Resp:  [16-20] 16 (11/30 0747) BP: (116-138)/(71-87) 116/80 (11/30 0747) SpO2:  [99 %-100 %] 99 % (11/30 0747) Last BM Date : 01/17/22  Intake/Output from previous day: 11/29 0701 - 11/30 0700 In: 500 [P.O.:490] Out: 1470 [Urine:1455; Drains:15] Intake/Output this shift: No intake/output data recorded.  General appearance: alert and cooperative Resp: no increased WOB Neurologic: Grossly normal alert and oriented x3  Lab Results:     Latest Ref Rng & Units 01/18/2022    5:14 AM 01/17/2022    4:21 AM 01/10/2022    3:55 AM  CBC  WBC 4.0 - 10.5 K/uL 7.7  7.1  9.8   Hemoglobin 13.0 - 17.0 g/dL 8.7  8.0  8.9   Hematocrit 39.0 - 52.0 % 28.7  26.1  27.4   Platelets  150 - 400 K/uL 332  332  191     BMET Recent Labs    01/17/22 0421 01/18/22 0514  NA 139 143  K 2.6* 3.5  CL 103 107  CO2 29 27  GLUCOSE 114* 160*  BUN 7* 7*  CREATININE 0.93 0.83  CALCIUM 8.9 9.5   PT/INR No results for input(s): "LABPROT", "INR" in the last 72 hours. ABG No results for input(s): "PHART", "HCO3" in the last 72 hours.  Invalid input(s): "PCO2", "PO2"  Studies/Results: No results found.  Anti-infectives: Anti-infectives (From admission, onward)    Start     Dose/Rate Route Frequency Ordered Stop   01/15/22 2200  cefadroxil (DURICEF) capsule 1,000 mg        1,000 mg Oral 2 times daily 01/15/22 1623     01/15/22 2200  metroNIDAZOLE (FLAGYL) tablet 500 mg        500 mg Oral Every 12 hours 01/15/22 1623     01/12/22 1230  piperacillin-tazobactam (ZOSYN) IVPB 3.375 g  Status:  Discontinued        3.375 g 12.5 mL/hr over 240 Minutes Intravenous Every 8 hours 01/12/22 1139 01/15/22 1622   01/11/22 1400  ceFAZolin (ANCEF) IVPB 2g/100 mL premix  Status:  Discontinued        2 g 200 mL/hr over 30 Minutes Intravenous Every 8 hours 01/10/22 1330 01/12/22 1007   01/08/22 1400  cefTRIAXone (ROCEPHIN) 2 g in sodium chloride 0.9 % 100 mL IVPB  Status:  Discontinued        2 g 200 mL/hr over 30 Minutes Intravenous Every 24 hours 01/08/22 1250 01/10/22 1330   01/07/22 1500  piperacillin-tazobactam (ZOSYN) IVPB 3.375 g  Status:  Discontinued        3.375 g 12.5 mL/hr over 240 Minutes Intravenous Every 8 hours 01/07/22 1357 01/08/22 1244   01/06/22 1100  vancomycin (VANCOCIN) IVPB 1000 mg/200 mL premix  Status:  Discontinued        1,000 mg 200 mL/hr over 60 Minutes Intravenous Every 24 hours 01/05/22 0946 01/05/22 0949   01/06/22 0000  vancomycin variable dose per unstable renal function (pharmacist dosing)  Status:  Discontinued         Does not apply See admin instructions 01/05/22 0949 01/05/22 1514   01/05/22 2200  ceFEPIme (MAXIPIME) 2 g in sodium chloride 0.9 %  100 mL IVPB  Status:  Discontinued        2 g 200 mL/hr over 30 Minutes Intravenous Every 12 hours 01/05/22 0946 01/05/22 1514   01/05/22 2000  piperacillin-tazobactam (ZOSYN) IVPB 3.375 g  Status:  Discontinued        3.375 g 12.5 mL/hr over 240 Minutes Intravenous Every 8 hours 01/05/22 1711 01/07/22 1250   01/05/22 1515  linezolid (ZYVOX) IVPB 600 mg  Status:  Discontinued        600 mg 300 mL/hr over 60 Minutes Intravenous Every 12 hours 01/05/22 1514 01/08/22 1244   01/05/22 0945  ceFEPIme (MAXIPIME) 2 g in sodium chloride 0.9 % 100 mL IVPB        2 g 200 mL/hr over 30 Minutes Intravenous  Once 01/05/22 0932 01/05/22 1038   01/05/22 0945  metroNIDAZOLE (FLAGYL) IVPB 500 mg        500 mg 100 mL/hr over 60 Minutes Intravenous  Once 01/05/22 0932 01/05/22 1103   01/05/22 0945  vancomycin (VANCOCIN) IVPB 1000 mg/200 mL premix  Status:  Discontinued        1,000 mg 200 mL/hr over 60 Minutes Intravenous  Once 01/05/22 0932 01/05/22 0944   01/05/22 0945  vancomycin (VANCOREADY) IVPB 2000 mg/400 mL        2,000 mg 200 mL/hr over 120 Minutes Intravenous  Once 01/05/22 0944 01/05/22 1256       Assessment/Plan: s/p Procedure(s): EXCISIONAL DEBRIDEMENT BILATERAL GROIN AND LOWER ABDOMEN    LOS: 13 days   Family was not present at bedside. RN and IR PA were at bedside.  Patient alert and oriented x3.  Understands what is to be done tomorrow in surgery and is agreeable to proceed.  Understands that this will be a staged surgery and that daily wound care will be needed for foreseeable future.  Asked Sharol Roussel RN to reach out if family has any additional questions.  Will plan to hold patient's 10 AM Lovenox tomorrow until after his surgery.  Will also hold NPO after midnight.     Krista Blue, PA-C 01/18/2022

## 2022-01-18 NOTE — Progress Notes (Signed)
Physical Therapy Treatment Patient Details Name: Timothy Davenport MRN: [id number] DOB: Nov 13, 1950 Today's Date: 01/18/2022   History of Present Illness Pt is a 71 yo male admitted for gangrene of the scrotum.  Pt with excisional debridement of B groin and lower abdomen. Third I&D of wound on 11/21.  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 PHM: T8 incomplete paraplegia with neurogenic bladder.    PT Comments    Pt seen for PT tx with co-tx with OT. Pt pleasant & agreeable to tx. Pt requires max assist +1 for supine<>sit on this date. Pt engaged in STS & standing in stedy lift for BLE strengthening & balance training. Pt requires mod +2 increasing to max assist +2 for STS as pt able to clear buttocks but unable to upright trunk without extensive cuing/assistance. Pt fatigues quickly & demonstrates worsening L lateral lean as pt reports L side is weaker than R. Pt sat EOB with intermittent 1UE support with up to max assist for sitting balance while engaging in grooming tasks. Will continue to follow pt acutely to address balance, strength, and activity tolerance.    Recommendations for follow up therapy are one component of a multi-disciplinary discharge planning process, led by the attending physician.  Recommendations may be updated based on patient status, additional functional criteria and insurance authorization.  Follow Up Recommendations  Skilled nursing-short term rehab (<3 hours/day) Can patient physically be transported by private vehicle: No   Assistance Recommended at Discharge Frequent or constant Supervision/Assistance  Patient can return home with the following Two people to help with walking and/or transfers;Two people to help with bathing/dressing/bathroom;Direct supervision/assist for medications management;Help with stairs or ramp for entrance;Assist for transportation   Equipment Recommendations  None recommended by PT    Recommendations for Other Services       Precautions /  Restrictions Precautions Precautions: Fall;Other (comment) Precaution Comments: significant open abdominal/groin wound Required Braces or Orthoses: Other Brace Other Brace: bulky dressing to groin area held in place with mesh underwear, JP drain to R low back/buttock area Restrictions Weight Bearing Restrictions: No Other Position/Activity Restrictions: Avoid shearing during transfers and bed mobility due to groin debridement.     Mobility  Bed Mobility Overal bed mobility: Needs Assistance Bed Mobility: Supine to Sit, Sit to Supine     Supine to sit: Max assist, HOB elevated Sit to supine: Max assist, HOB elevated   General bed mobility comments: Pt moves BLE to EOB with BUE during supine>sit, requires assistance to elevate BLE onto bed during sit>supine. Able to scoot to [facility] with bed rails & BUE.    Transfers Overall transfer level: Needs assistance   Transfers: Sit to/from Stand Sit to Stand: Max assist, +2 physical assistance, From elevated surface, Mod assist           General transfer comment: Mod assist +2 increasing to max assist +2, cuing to place BLE knees on plate of stedy to block them. Pt is able to pull to standing & clears buttocks but has great difficulty uprighting trunk vs foward lean. STS x 4 times total duirng session.    Ambulation/Gait                   Stairs             Wheelchair Mobility    Modified Rankin (Stroke Patients Only)       Balance Overall balance assessment: Needs assistance Sitting-balance support: Feet supported, Bilateral upper extremity supported Sitting balance-Leahy Scale: Poor Sitting  balance - Comments: CGA increasing to max assist as pt fatigues. Pt with L lateral lean (reports L side is weaker than R). PT attempts to provide cuing/assistance for midline orientation but poor ability for pt to shift to R.   Standing balance support: During functional activity, Bilateral upper extremity supported, [place]  on assistive device for balance                                Cognition Arousal/Alertness: Awake/alert Behavior During Therapy: WFL for tasks assessed/performed Overall Cognitive Status: Within Functional Limits for tasks assessed                                 General Comments: Follows simple commands throughout session.        Exercises      General Comments General comments (skin integrity, edema, etc.): Pt engaged in grooming tasks (brushing teeth/dentures) while sitting EOB.      Pertinent Vitals/Pain Pain Assessment Pain Assessment: No/denies pain    Home Living                          Prior Function            PT Goals (current goals can now be found in the care plan section) Acute Rehab PT Goals PT Goal Formulation: With patient Time For Goal Achievement: 01/24/22 Potential to Achieve Goals: Good Progress towards PT goals: Progressing toward goals    Frequency    Min 3X/week      PT Plan Current plan remains appropriate    Co-evaluation PT/OT/SLP Co-Evaluation/Treatment: Yes Reason for Co-Treatment: For patient/therapist safety;Complexity of the patient's impairments (multi-system involvement) PT goals addressed during session: Mobility/safety with mobility;Balance        AM-PAC PT "6 Clicks" Mobility   Outcome Measure  Help needed turning from your back to your side while in a flat bed without using bedrails?: A Little Help needed moving from lying on your back to sitting on the side of a flat bed without using bedrails?: Total Help needed moving to and from a bed to a chair (including a wheelchair)?: Total Help needed standing up from a chair using your arms (e.g., wheelchair or bedside chair)?: Total Help needed to walk in hospital room?: Total Help needed climbing 3-5 steps with a railing? : Total 6 Click Score: 8    End of Session   Activity Tolerance: Patient tolerated treatment well;Patient  limited by fatigue Patient left: in bed;with call bell/phone within reach;with bed alarm set Nurse Communication: Mobility status PT Visit Diagnosis: Other abnormalities of gait and mobility (R26.89);Muscle weakness (generalized) (M62.81)     Time: 3374-4514 PT Time Calculation (min) (ACUTE ONLY): 28 min  Charges:  $Neuromuscular Re-education: 8-22 mins                     Timothy Davenport, PT, DPT 01/18/22, 12:14 PM   Timothy Davenport 01/18/2022, 12:13 PM

## 2022-01-18 NOTE — Progress Notes (Signed)
Referring Physician(s): Dr. Evette Doffing   Supervising Physician: Daryll Brod  Patient Status:  [facility] - In-pt  Chief Complaint: Fournier's gangrene status post surgical debridement. Post-procedure pelvic abscess now with drain placed in IR 01/12/22 by Dr. Maryelizabeth Kaufmann  Subjective:  Pt laying in bed, NAD. RN at bedside, PA from plastic sx at bedside, pt scheduled for wound closure tomorrow.  Report no concern/complaints with the R TG drain.   Allergies: Jardiance [empagliflozin], Lisinopril, and Enalapril-hctz [enalapril-hydrochlorothiazide]  Medications: Prior to Admission medications   Medication Sig Start Date End Date Taking? Authorizing Provider  Alogliptin Benzoate 25 MG TABS Take 12.5 tablets by mouth daily with breakfast.   Yes [provider]  Ascorbic Acid 500 MG CAPS Take 500 mg by mouth every Monday, Wednesday, and Friday. 05/03/21  Yes [provider]  aspirin EC 81 MG tablet Take 1 tablet (81 mg total) by mouth daily. Swallow whole. 04/30/21  Yes Elgergawy, Silver Huguenin, MD  baclofen (LIORESAL) 20 MG tablet Take 20 mg by mouth every 8 (eight) hours. 01/11/20  Yes [provider]  cefadroxil (DURICEF) 500 MG capsule Take 1 capsule (500 mg total) by mouth 2 (two) times daily. 01/03/22  Yes Prosperi, Christian H, PA-C  Cholecalciferol 100 MCG (4000 UT) TABS Take 4,000 Units by mouth daily. 01/10/21  Yes [provider]  dantrolene (DANTRIUM) 25 MG capsule Take 75 mg by mouth 2 (two) times daily.   Yes [provider]  docusate sodium (COLACE) 50 MG capsule Take 50 mg by mouth daily.   Yes [provider]  empagliflozin (JARDIANCE) 25 MG TABS tablet Take 12.5 mg by mouth daily. 10/20/20  Yes [provider]  ferrous gluconate (FERGON) 324 MG tablet Take 324 mg by mouth every Monday, Wednesday, and Friday. 05/03/21  Yes [provider]  folic acid (FOLVITE) 1 MG tablet Take 1 mg by mouth daily. 12/06/21  Yes [provider]  gabapentin (NEURONTIN) 400 MG capsule Take 800 mg by mouth 3 (three) times daily. 01/28/20  Yes [provider]  glucose 4 GM chewable tablet Chew 4 tablets by mouth as needed for low blood sugar.   Yes [provider]  insulin glargine-yfgn (SEMGLEE) 100 UNIT/ML Pen Inject 10 Units into the skin at bedtime. 12/06/21  Yes [provider]  losartan (COZAAR) 50 MG tablet Take 75 mg by mouth daily. 12/06/21  Yes [provider]  omeprazole (PRILOSEC) 20 MG capsule Take 20 mg by mouth daily. 07/31/21  Yes [provider]  oxybutynin (DITROPAN) 5 MG tablet Take 5 mg by mouth 2 (two) times daily.   Yes [provider]  pioglitazone (ACTOS) 30 MG tablet Take 30 mg by mouth daily.   Yes [provider]  sertraline (ZOLOFT) 100 MG tablet Take 100 mg by mouth daily.   Yes [provider]  simvastatin (ZOCOR) 20 MG tablet Take 20 mg by mouth daily at 6 PM.   Yes [provider]  acetaminophen (TYLENOL) 325 MG tablet Take 1-2 tablets (325-650 mg total) by mouth every 4 (four) hours as needed for mild pain. Patient not taking: Reported on 01/06/2022 07/20/19   Love, Ivan Anchors, PA-C  bisacodyl (DULCOLAX) 10 MG suppository UNWRAP AND INSERT 1 SUPPOSITORY(10 MG) RECTALLY DAILY AFTER SUPPER Patient not taking: Reported on 01/06/2022 08/21/19   Raulkar, Clide Deutscher, MD  dantrolene (DANTRIUM) 100 MG capsule Take 1 capsule (100 mg total) by mouth 3 (three) times daily. Patient not taking: Reported on 01/06/2022  03/08/21   Lovorn, Jinny Blossom, MD  polyvinyl alcohol (LIQUIFILM TEARS) 1.4 % ophthalmic solution Place 1 drop into both eyes 4 (four) times daily. Patient not taking: Reported on 01/06/2022 07/20/19   Bary Leriche, PA-C     Vital Signs: BP (!) 131/95 (BP Location: Left Arm)   Pulse 69   Temp 98.6 F (37 C) (Oral)   Resp 16   Ht '5\' 11"'$  (1.803 m)   Wt 169 lb 15.6 oz (77.1 kg)   SpO2 100%   BMI 23.71 kg/m   Physical  Exam Constitutional:      General: He is not in acute distress.    Appearance: He is not ill-appearing.  Pulmonary:     Effort: Pulmonary effort is normal.  Abdominal:     Comments: Right TG drain to suction. Trace clear fluid in bulb. Site clean and dry, suture in placem dressed appropriately. Flushes well, does not aspirate.  Skin:    General: Skin is warm and dry.  Neurological:     Mental Status: He is alert and oriented to person, place, and time.     Imaging: CT GUIDED VISCERAL FLUID DRAIN BY [facility] CATH  Result Date: 01/12/2022 INDICATION: Pelvic abscess. EXAM: CT-GUIDED RIGHT PELVIC ABSCESS DRAIN PLACEMENT COMPARISON:  CT AP, 01/05/2022. CT pelvis, 01/08/2022 and 01/12/2022. MEDICATIONS: 50 mg Benadryl IV. The patient is currently admitted to the hospital and receiving intravenous antibiotics. The antibiotics were administered within an appropriate time frame prior to the initiation of the procedure. ANESTHESIA/SEDATION: Local anesthetic and single agent sedation was employed during this procedure. A total of fentanyl 100 mcg was administered intravenously. The patient's level of consciousness and vital signs were monitored continuously by radiology nursing throughout the procedure under my direct supervision. CONTRAST:  None COMPLICATIONS: None immediate. PROCEDURE: RADIATION DOSE REDUCTION: This exam was performed according to the departmental dose-optimization program which includes automated exposure control, adjustment of the mA and/or kV according to patient size and/or use of iterative reconstruction technique. Informed written consent was obtained from the patient and/or patient's representative after a discussion of the risks, benefits and alternatives to treatment. The patient was placed prone on the CT gantry and a pre procedural CT was performed re-demonstrating the known abscess/fluid collection within the RIGHT deep pelvis. The procedure was planned. A timeout was performed  prior to the initiation of the procedure. The RIGHT gluteus was prepped and draped in the usual sterile fashion. The overlying soft tissues were anesthetized with 1% lidocaine with epinephrine. Appropriate trajectory was planned with the use of a 22 gauge spinal needle. An 18 gauge trocar needle was advanced into the abscess/fluid collection and a short Amplatz super stiff wire was coiled within the collection. Appropriate positioning was confirmed with a limited CT scan. The tract was serially dilated allowing placement of a 10 Fr drainage catheter. Appropriate positioning was confirmed with a limited postprocedural CT scan. 25 mL of purulent fluid was aspirated. The tube was connected to a bulb suction and sutured in place. A dressing was placed. The patient tolerated the procedure well without immediate post procedural complication. IMPRESSION: Successful placement of a 10 Fr drainage catheter into a RIGHT pelvic abscess via transgluteal approach, with aspiration of 25 mL of purulent fluid. Samples were sent to the laboratory as requested by the ordering clinical team. Michaelle Birks, MD [facility] Electronically Signed   By: Michaelle Birks M.D.   On: 01/12/2022 15:39   CT PELVIS W CONTRAST  Result Date: 01/12/2022 CLINICAL DATA:  Pelvic abscess. EXAM: CT PELVIS WITH CONTRAST TECHNIQUE: Multidetector CT imaging of the pelvis was performed using the standard protocol following the bolus administration of intravenous contrast. RADIATION DOSE REDUCTION: This exam was performed according to the departmental dose-optimization program which includes automated exposure control, adjustment of the mA and/or kV according to patient size and/or use of iterative reconstruction technique. CONTRAST:  15m OMNIPAQUE IOHEXOL 350 MG/ML SOLN COMPARISON:  01/08/2022 FINDINGS: Urinary Tract: Distal ureters are unremarkable. Urinary bladder is decompressed by a Foley  catheter. Bowel: Normal appearance of the appendix. Normal appearance of large bowel loops. Normal appearance of small bowel loops. Adjacent to the sigmoid colon and RIGHT seminal vesicle, there is a rim enhancing fluid collection measuring 4.1 x 4.0 centimeters, previously 3.6 x 3.7 centimeters at the same level. No new fluid collections. Vascular/Ly Mphatic: There is dense atherosclerotic calcification of the abdominal aorta and its branches. No retroperitoneal or mesenteric adenopathy. Reproductive: Prostate gland unremarkable. Abscess collection adjacent to RIGHT seminal vesicle, described above. Other: Large surgical defect involving the LOWER anterior abdominal wall and perineum, LEFT greater than RIGHT. The appearance is similar to prior study. Musculoskeletal: There is a nonspecific 1.1 centimeter low-attenuation lesion within the posterior RIGHT ilium. IMPRESSION: 1. Slight increase in size of abscess collection adjacent to the sigmoid colon and RIGHT seminal vesicle, now measuring 4.1 x 4.0 centimeters, previously 3.6 x 3.7 centimeters. No new fluid collections. 2. Similar large surgical defect involving the LOWER anterior abdominal wall and perineum. 3. Nonspecific 1.1 centimeter low-attenuation lesion within the posterior RIGHT ilium. 4.  Aortic Atherosclerosis (ICD10-I70.0). Electronically Signed   By: ENolon NationsM.D.   On: 01/12/2022 09:01    Labs:  CBC: Recent Labs    01/07/22 1405 01/08/22 0333 01/09/22 0234 01/10/22 0355  WBC 15.4* 13.2* 9.8 9.8  HGB 9.8* 8.8* 8.5* 8.9*  HCT 29.3* 26.5* 26.6* 27.4*  PLT 188 162 178 191    COAGS: Recent Labs    04/24/21 1410  INR 1.0    BMP: Recent Labs    01/08/22 0333 01/10/22 0355 01/12/22 0703 01/13/22 0233  NA 140 141 142 140  K 3.7 3.6 2.9* 3.1*  CL 108 103 103 103  CO2 '23 29 24 29  '$ GLUCOSE 150* 115* 140* 139*  BUN '18 13 10 9  '$ CALCIUM 9.8 9.7 9.4 9.3  CREATININE 1.12 0.90 0.91 0.90  GFRNONAA >60 >60 >60 >60     LIVER FUNCTION TESTS: Recent Labs    03/08/21 1137 04/24/21 0921 01/05/22 0900  BILITOT 0.2 0.3 0.6  AST 16 12* 16  ALT '8 11 12  '$ ALKPHOS 82 51 71  PROT 7.9 7.3 7.7  ALBUMIN 3.9 3.5 2.6*    Assessment and Plan:  Fournier's gangrene status post surgical debridement. Post-procedure pelvic abscess now with drain placed in IR 01/12/22 by Dr. MMaryelizabeth Kaufmann WBC wnl Tmax 100.4 overnight  Cx citrobacter koseri  Output 15 mL overnight, trace clear fluid in the suction bulb.    Drain Location: RTG Size: Fr size: 10 Fr Date of placement: 11/24  Currently to: Drain collection device: suction bulb 24 hour output:  Output by Drain (mL) 01/16/22 0701 - 01/16/22 1900 01/16/22 1901 - 01/17/22 0700 01/17/22 0701 - 01/17/22 1900 01/17/22 1901 - 01/18/22 0700 01/18/22 0701 - 01/18/22 0959  Closed System Drain 1 Right Buttock Bulb (JP)  0  15     Interval imaging/drain manipulation:  None   Current examination: Flushes/aspirates easily.  Insertion site unremarkable. Suture and stat lock in place. Dressed appropriately.   Plan: Continue TID flushes with 5 cc NS. Record output Q shift. Dressing changes QD or PRN if soiled.  Call IR APP or on call IR MD if difficulty flushing or sudden change in drain output.  Repeat imaging/possible drain injection once output < 10 mL/QD (excluding flush material). Consideration for drain removal if output is < 10 mL/QD (excluding flush material), pending discussion with the providing surgical service.  Discharge planning: Please contact IR APP or on call IR MD prior to patient d/c to ensure appropriate follow up plans are in place. Typically patient will follow up with IR clinic 10-14 days post d/c for repeat imaging/possible drain injection. IR scheduler will contact patient with date/time of appointment. Patient will need to flush drain QD with 5 cc NS, record output QD, dressing changes every 2-3 days or earlier if soiled.   IR will continue to follow  - please call with questions or concerns.    Electronically Signed:  Tera Mater PA-C 01/18/2022 9:59 AM       I spent a total of 15 Minutes at the the patient's bedside AND on the patient's hospital floor or unit, greater than 50% of which was counseling/coordinating care for right TG drain.

## 2022-01-19 ENCOUNTER — Encounter (HOSPITAL_COMMUNITY)
Admission: EM | Disposition: A | Discharge status: Nursing Home (Includes ICF) | Payer: Self-pay | Source: Home / Self Care | Attending: Internal Medicine

## 2022-01-19 ENCOUNTER — Inpatient Hospital Stay (HOSPITAL_COMMUNITY): Payer: Medicare Other | Admitting: Registered Nurse

## 2022-01-19 ENCOUNTER — Encounter (HOSPITAL_COMMUNITY): Payer: Self-pay | Admitting: Internal Medicine

## 2022-01-19 DIAGNOSIS — S31501A Unspecified open wound of unspecified external genital organs, male, initial encounter: Secondary | ICD-10-CM

## 2022-01-19 DIAGNOSIS — I10 Essential (primary) hypertension: Secondary | ICD-10-CM

## 2022-01-19 DIAGNOSIS — N493 Fournier gangrene: Secondary | ICD-10-CM

## 2022-01-19 DIAGNOSIS — Z7984 Long term (current) use of oral hypoglycemic drugs: Secondary | ICD-10-CM

## 2022-01-19 DIAGNOSIS — E119 Type 2 diabetes mellitus without complications: Secondary | ICD-10-CM

## 2022-01-19 HISTORY — PX: DEBRIDEMENT AND CLOSURE WOUND: SHX5614

## 2022-01-19 LAB — SURGICAL PCR SCREEN
MRSA, PCR: NEGATIVE
Staphylococcus aureus: NEGATIVE

## 2022-01-19 LAB — GLUCOSE, CAPILLARY
Glucose-Capillary: 117 mg/dL — ABNORMAL HIGH (ref 70–99)
Glucose-Capillary: 117 mg/dL — ABNORMAL HIGH (ref 70–99)
Glucose-Capillary: 100 mg/dL — ABNORMAL HIGH (ref 70–99)
Glucose-Capillary: 104 mg/dL — ABNORMAL HIGH (ref 70–99)
Glucose-Capillary: 105 mg/dL — ABNORMAL HIGH (ref 70–99)

## 2022-01-19 SURGERY — DEBRIDEMENT, WOUND, WITH CLOSURE
Anesthesia: General | Site: Penis

## 2022-01-19 MED ORDER — EPHEDRINE 5 MG/ML INJ
INTRAVENOUS | Status: AC
  Filled 2022-01-19: qty 5

## 2022-01-19 MED ORDER — ROCURONIUM BROMIDE 10 MG/ML (PF) SYRINGE
PREFILLED_SYRINGE | INTRAVENOUS | Status: AC
  Filled 2022-01-19: qty 10

## 2022-01-19 MED ORDER — LACTATED RINGERS IV SOLN: INTRAVENOUS | Status: DC

## 2022-01-19 MED ORDER — ONDANSETRON HCL 4 MG/2ML IJ SOLN: 4.0000 mg | Freq: Once | INTRAMUSCULAR | Status: DC | PRN

## 2022-01-19 MED ORDER — INSULIN ASPART 100 UNIT/ML IJ SOLN: 0.0000 [IU] | INTRAMUSCULAR | Status: DC | PRN

## 2022-01-19 MED ORDER — PROPOFOL 10 MG/ML IV BOLUS
INTRAVENOUS | Status: DC | PRN
  Administered 2022-01-19: 120 mg via INTRAVENOUS

## 2022-01-19 MED ORDER — PHENYLEPHRINE 80 MCG/ML (10ML) SYRINGE FOR IV PUSH (FOR BLOOD PRESSURE SUPPORT)
PREFILLED_SYRINGE | INTRAVENOUS | Status: DC | PRN
  Administered 2022-01-19 (×3): 160 ug via INTRAVENOUS

## 2022-01-19 MED ORDER — CHLORHEXIDINE GLUCONATE 0.12 % MT SOLN
OROMUCOSAL | Status: AC
  Administered 2022-01-19: 15 mL via OROMUCOSAL
  Filled 2022-01-19: qty 15

## 2022-01-19 MED ORDER — PROPOFOL 10 MG/ML IV BOLUS
INTRAVENOUS | Status: AC
  Filled 2022-01-19: qty 20

## 2022-01-19 MED ORDER — FENTANYL CITRATE (PF) 250 MCG/5ML IJ SOLN
INTRAMUSCULAR | Status: DC | PRN
  Administered 2022-01-19: 50 ug via INTRAVENOUS

## 2022-01-19 MED ORDER — LIDOCAINE 2% (20 MG/ML) 5 ML SYRINGE
INTRAMUSCULAR | Status: AC
  Filled 2022-01-19: qty 5

## 2022-01-19 MED ORDER — ACETAMINOPHEN 10 MG/ML IV SOLN
INTRAVENOUS | Status: AC
  Filled 2022-01-19: qty 100

## 2022-01-19 MED ORDER — FENTANYL CITRATE (PF) 100 MCG/2ML IJ SOLN: 25.0000 ug | INTRAMUSCULAR | Status: DC | PRN

## 2022-01-19 MED ORDER — ORAL CARE MOUTH RINSE: 15.0000 mL | Freq: Once | OROMUCOSAL | Status: AC

## 2022-01-19 MED ORDER — ONDANSETRON HCL 4 MG/2ML IJ SOLN
INTRAMUSCULAR | Status: AC
  Filled 2022-01-19: qty 2

## 2022-01-19 MED ORDER — MIDAZOLAM HCL 2 MG/2ML IJ SOLN
INTRAMUSCULAR | Status: AC
  Filled 2022-01-19: qty 2

## 2022-01-19 MED ORDER — CHLORHEXIDINE GLUCONATE 0.12 % MT SOLN: 15.0000 mL | Freq: Once | OROMUCOSAL | Status: AC

## 2022-01-19 MED ORDER — LIDOCAINE 2% (20 MG/ML) 5 ML SYRINGE
INTRAMUSCULAR | Status: DC | PRN
  Administered 2022-01-19: 80 mg via INTRAVENOUS

## 2022-01-19 MED ORDER — ROCURONIUM BROMIDE 10 MG/ML (PF) SYRINGE
PREFILLED_SYRINGE | INTRAVENOUS | Status: DC | PRN
  Administered 2022-01-19: 50 mg via INTRAVENOUS

## 2022-01-19 MED ORDER — 0.9 % SODIUM CHLORIDE (POUR BTL) OPTIME
TOPICAL | Status: DC | PRN
  Administered 2022-01-19: 1000 mL
  Administered 2022-01-19: 2000 mL

## 2022-01-19 MED ORDER — ONDANSETRON HCL 4 MG/2ML IJ SOLN
INTRAMUSCULAR | Status: DC | PRN
  Administered 2022-01-19: 4 mg via INTRAVENOUS

## 2022-01-19 MED ORDER — PHENYLEPHRINE HCL-NACL 20-0.9 MG/250ML-% IV SOLN
INTRAVENOUS | Status: DC | PRN
  Administered 2022-01-19: 20 ug/min via INTRAVENOUS

## 2022-01-19 MED ORDER — ACETAMINOPHEN 10 MG/ML IV SOLN
INTRAVENOUS | Status: DC | PRN
  Administered 2022-01-19: 1000 mg via INTRAVENOUS

## 2022-01-19 MED ORDER — LIDOCAINE-EPINEPHRINE 1 %-1:100000 IJ SOLN
INTRAMUSCULAR | Status: AC
  Filled 2022-01-19: qty 1

## 2022-01-19 MED ORDER — PHENYLEPHRINE 80 MCG/ML (10ML) SYRINGE FOR IV PUSH (FOR BLOOD PRESSURE SUPPORT)
PREFILLED_SYRINGE | INTRAVENOUS | Status: AC
  Filled 2022-01-19: qty 10

## 2022-01-19 MED ORDER — SUGAMMADEX SODIUM 200 MG/2ML IV SOLN
INTRAVENOUS | Status: DC | PRN
  Administered 2022-01-19: 200 mg via INTRAVENOUS

## 2022-01-19 MED ORDER — FENTANYL CITRATE (PF) 250 MCG/5ML IJ SOLN
INTRAMUSCULAR | Status: AC
  Filled 2022-01-19: qty 5

## 2022-01-19 MED ORDER — EPHEDRINE SULFATE-NACL 50-0.9 MG/10ML-% IV SOSY
PREFILLED_SYRINGE | INTRAVENOUS | Status: DC | PRN
  Administered 2022-01-19: 5 mg via INTRAVENOUS

## 2022-01-19 SURGICAL SUPPLY — 35 items
BAG COUNTER SPONGE SURGICOUNT (BAG) ×1 IMPLANT
BAG SPNG CNTER NS LX DISP (BAG) ×1
CANISTER SUCT 3000ML PPV (MISCELLANEOUS) ×1 IMPLANT
COVER SURGICAL LIGHT HANDLE (MISCELLANEOUS) ×1 IMPLANT
DRAPE IMP U-DRAPE 54X76 (DRAPES) ×1 IMPLANT
DRAPE LAPAROTOMY 100X72 PEDS (DRAPES) ×1 IMPLANT
DRSG CUTIMED SORBACT 7X9 (GAUZE/BANDAGES/DRESSINGS) IMPLANT
DRSG MEPILEX POST OP 4X8 (GAUZE/BANDAGES/DRESSINGS) IMPLANT
DRSG TUBE GAUZE 1X5YD SZ2 (GAUZE/BANDAGES/DRESSINGS) IMPLANT
ELECT REM PT RETURN 9FT ADLT (ELECTROSURGICAL) ×1
ELECTRODE REM PT RTRN 9FT ADLT (ELECTROSURGICAL) ×1 IMPLANT
GAUZE PAD ABD 8X10 STRL (GAUZE/BANDAGES/DRESSINGS) IMPLANT
GAUZE SPONGE 4X4 12PLY STRL (GAUZE/BANDAGES/DRESSINGS) IMPLANT
GLOVE BIO SURGEON STRL SZ 6.5 (GLOVE) IMPLANT
GLOVE BIO SURGEON STRL SZ8 (GLOVE) ×1 IMPLANT
GLOVE BIOGEL PI IND STRL 8 (GLOVE) ×1 IMPLANT
GOWN STRL REUS W/ TWL LRG LVL3 (GOWN DISPOSABLE) ×3 IMPLANT
GOWN STRL REUS W/TWL LRG LVL3 (GOWN DISPOSABLE) ×3
GOWN STRL REUS W/TWL XL LVL3 (GOWN DISPOSABLE) ×1 IMPLANT
GRAFT MYRIAD 20X20 (Graft) IMPLANT
KIT BASIN OR (CUSTOM PROCEDURE TRAY) ×1 IMPLANT
KIT TURNOVER KIT B (KITS) ×1 IMPLANT
NDL HYPO 25GX1X1/2 BEV (NEEDLE) ×1 IMPLANT
NEEDLE HYPO 25GX1X1/2 BEV (NEEDLE) ×1 IMPLANT
NS IRRIG 1000ML POUR BTL (IV SOLUTION) ×1 IMPLANT
PACK GENERAL/GYN (CUSTOM PROCEDURE TRAY) ×1 IMPLANT
PACK UNIVERSAL I (CUSTOM PROCEDURE TRAY) ×1 IMPLANT
PAD ARMBOARD 7.5X6 YLW CONV (MISCELLANEOUS) ×2 IMPLANT
STAPLER VISISTAT 35W (STAPLE) ×1 IMPLANT
SUT CHROMIC 2 0 SH (SUTURE) IMPLANT
SUT MNCRL AB 3-0 PS2 18 (SUTURE) IMPLANT
SUT PROLENE 4 0 PS 2 18 (SUTURE) IMPLANT
SYR CONTROL 10ML LL (SYRINGE) ×1 IMPLANT
TOWEL GREEN STERILE (TOWEL DISPOSABLE) ×1 IMPLANT
UNDERPAD 30X36 HEAVY ABSORB (UNDERPADS AND DIAPERS) ×1 IMPLANT

## 2022-01-19 NOTE — Op Note (Signed)
DATE OF OPERATION: 01/19/2022  LOCATION: [facility] Main operating Room  PREOPERATIVE DIAGNOSIS: Open wounds, status postdebridement for Fournier's gangrene  POSTOPERATIVE DIAGNOSIS: Same  PROCEDURE: Wound washout, closure with advancement flaps, placement of  Myriad matrix.  Preprocedure wound length 25 cm transversely and 19 cm cranial to caudal distance  SURGEON: Jeanann Lewandowsky, MD  ASSISTANT: Donnamarie Rossetti  EBL: 10 cc  CONDITION: Stable  COMPLICATIONS: None  INDICATION: The patient, Timothy Davenport, is a 71 y.o. male born on 1951-01-20, is here for treatment of his open wounds.[name]   PROCEDURE DETAILS:  The patient was seen prior to surgery and marked.  He remained on his hospital antibiotics and no additional antibiotics were given.  The patient was taken to the operating room and given a general anesthetic. A standard time out was performed and all information was confirmed by those in the room. SCDs were placed.   The wounds were irrigated with approximately 3 L of saline.  The wounds were clean with little to no necrotic tissue.  A very minimal amount of sharp debridement was performed.  To close the left lateral transverse incision elected to elevate the abdominal wall in the prefascial plane nominal wall was elevated for approximately 8 cm in the cranial direction wide enough caudal mobility to close the wounds without significant tension the wound was closed with interrupted 3-0 Monocryl sutures in the dermis and a running 4-0 Prolene suture in the skin.  The total length of the wound closed was approximately 16 cm.  1 evaluation of the scrotum there was more scrotal tissue remaining that was obvious at the beginning of the case I was able to replace the left testicle into the scrotum and sutured it to the scrotal tissue to prevent torsion with 2 simple interrupted 2-0 chromic sutures.  The remaining scrotal skin was reapproximated over the left testicle with interrupted 3-0 Monocryl sutures in  the dermis and a running 2-0 chromic suture in the skin.  The remaining wound where there is no native soft tissue to cover was approximately 10 x 10 cm.  The penile skin had also been debrided leaving the underlying soft tissues exposed the circumference of the penis was approximately 11 cm in the length approximately 14 cm.  All of the exposed tissue was then covered with.  Matrix.  A 20 x 20 cm sheet of matrix was used.  The matrix was laid in the wound and wet with saline and then covered with K-Y jelly.  Sorbact dressing was then sutured over the top of the matrix to hold it in place dressings of saline soaked Kerlix and an ABD pad held in place with mesh underwear were then placed. The patient was allowed to wake up and taken to recovery room in stable condition at the end of the case.  All instrument needle and sponge counts were reported as correct the family was notified at the end of the case.   The advanced practice practitioner (APP), Ms. Mosetta Putt, assisted throughout the case.  The APP was essential in retraction and counter traction when needed to make the case progress smoothly.  This retraction and assistance made it possible to see the tissue plans for the procedure.  The assistance was needed for blood control, tissue re-approximation and assisted with closure of the incision site.

## 2022-01-19 NOTE — Progress Notes (Signed)
Patient  has NPO diet, came back from surgery, wants food to eat,MD notified,will continue to monitor.

## 2022-01-19 NOTE — Progress Notes (Signed)
Patient doing well from debridements. Will be undergoing staged surgery by plastics. At this time general surgery will sign off but please call/reconsult if needed.  Winferd Humphrey, [facility] 01/19/2022, 11:23 AM Please see Amion for pager number during day hours 7:00am-4:30pm

## 2022-01-19 NOTE — H&P (Signed)
$'@LOGO'C$ @    Patient ID: Timothy Davenport, male    DOB: August 19, 1950, 71 y.o.   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   HTN (hypertension)    Patella fracture    Renal calculi     Past Surgical History:  Procedure Laterality Date   I & D EXTREMITY N/A 01/09/2022   Procedure: EXCISIONAL DEBRIDEMENT BILATERAL GROIN AND LOWER ABDOMEN;  Surgeon: Erroll Luna, MD;  Location: [place];  Service: General;  Laterality: N/A;   IRRIGATION AND DEBRIDEMENT ABSCESS N/A 01/05/2022   Procedure: IRRIGATION AND DEBRIDEMENT NECROTIZING  TISSUE RECTAL ABSCESS;  Surgeon: Vira Agar, MD;  Location: [place];  Service: Urology;  Laterality: N/A;   LUMBAR LAMINECTOMY/DECOMPRESSION MICRODISCECTOMY N/A 07/01/2019   Procedure: LUMBAR LAMINECTOMY/DECOMPRESSION MICRODISCECTOMY 1 LEVEL, THORACIC SIX-SEVEN;  Surgeon: Consuella Lose, MD;  Location: [place];  Service: Neurosurgery;  Laterality: N/A;  LUMBAR LAMINECTOMY/DECOMPRESSION MICRODISCECTOMY 1 LEVEL, THORACIC SIX-SEVEN    ORIF PATELLA     SCROTAL EXPLORATION N/A 01/07/2022   Procedure: DEBRIDEMENT OF PENIS, SCROTUM, TESTICLES, AND GROIN;  Surgeon: Vira Agar, MD;  Location: [place];  Service: Urology;  Laterality: N/A;   THORACIC DISCECTOMY N/A 07/03/2019   Procedure: Evacuation of Thoracic Hematoma;  Surgeon: Consuella Lose, MD;  Location: [place];  Service: Neurosurgery;  Laterality: N/A;   WOUND DEBRIDEMENT N/A 01/07/2022   Procedure: DEBRIDEMENT OF GROIN AND ABDOMINAL WALL;  Surgeon: Dwan Bolt, MD;  Location: [place] [facility];  Service: General;  Laterality: N/A;      Current Facility-Administered Medications:     [MAR Hold] acetaminophen (TYLENOL) tablet 1,000 mg, 1,000 mg, Oral, Q6H PRN, Winferd Humphrey, PA-C, 1,000 mg at 01/15/22 2135   [facility] Hold] cefadroxil (DURICEF) capsule 1,000 mg, 1,000 mg, Oral, BID, Vu, Trung T, MD, 1,000 mg at 01/18/22 2214   [MAR Hold] Chlorhexidine Gluconate Cloth 2 % PADS 6 each, 6 each, Topical, Daily, Angelica Pou, MD, 6 each at 01/19/22 1015   [MAR Hold] dantrolene (DANTRIUM) capsule 25 mg, 25 mg, Oral, TID, [name], DO, 25 mg at 01/18/22 2214   [MAR Hold] enoxaparin (LOVENOX) injection 40 mg, 40 mg, Subcutaneous, Daily, Corena Herter, PA-C   [MAR Hold] feeding supplement (ENSURE ENLIVE / ENSURE PLUS) liquid 237 mL, 237 mL, Oral, BID BM, Cornett, Thomas, MD, 237 mL at 01/18/22 1002   [MAR Hold] HYDROmorphone (DILAUDID) injection 0.5 mg, 0.5 mg, Intravenous, Q4H PRN, Winferd Humphrey, PA-C, 0.5 mg at 01/14/22 1119   insulin aspart (novoLOG) injection 0-7 Units, 0-7 Units, Subcutaneous, Q2H PRN, Lyn Hollingshead, MD   [facility] Hold] insulin aspart (novoLOG) injection 0-9 Units, 0-9 Units, Subcutaneous, TID WC, Cornett, Thomas, MD, 2 Units at 01/18/22 1748   [COMPLETED] lactated ringers bolus 1,000 mL, 1,000 mL, Intravenous, Once, Stopped at 01/05/22 1044 **AND** [MAR Hold] lactated ringers bolus 1,000 mL, 1,000 mL, Intravenous, Once **AND** [MAR Hold] lactated ringers bolus 500 mL, 500 mL, Intravenous, Once, Cornett, Thomas, MD   lactated ringers infusion, , Intravenous, Continuous, Hatchett, Franklin, MD, Last Rate: 10 mL/hr at 01/19/22 1516, Continued from Pre-op at 01/19/22 1516   [MAR Hold] lidocaine (XYLOCAINE)  1 % (with pres) injection 10 mL, 10 mL, Intradermal, Once, Mugweru, Jon, MD   [facility] Hold] metroNIDAZOLE (FLAGYL) tablet 500 mg, 500 mg, Oral, Q12H, Vu, Trung T, MD, 500 mg at 01/18/22 2214   [MAR Hold] mirtazapine (REMERON) tablet 15 mg, 15 mg, Oral, QHS, [name], DO, 15 mg at 01/18/22 2214   [MAR Hold] multivitamin with minerals tablet 1  tablet, 1 tablet, Oral, Daily, Cornett, Thomas, MD, 1 tablet at 01/18/22 1001   [MAR Hold] oxybutynin (DITROPAN) tablet 7.5 mg, 7.5 mg, Oral, BID, Cornett, Thomas, MD, 7.5 mg at 01/18/22 2214   [facility] Hold] oxyCODONE (Oxy IR/ROXICODONE) immediate release tablet 5-10 mg, 5-10 mg, Oral, Q4H PRN, Winferd Humphrey, PA-C, 5 mg at 01/17/22 2255   [MAR Hold] phenol (CHLORASEPTIC) mouth spray 1 spray, 1 spray, Mouth/Throat, PRN, Katsadouros, Vasilios, MD, 1 spray at 01/13/22 1624   [MAR Hold] polyethylene glycol (MIRALAX / GLYCOLAX) packet 17 g, 17 g, Oral, Daily, Cornett, Thomas, MD, 17 g at 01/18/22 1000   [MAR Hold] sertraline (ZOLOFT) tablet 100 mg, 100 mg, Oral, Daily, Cornett, Thomas, MD, 100 mg at 01/18/22 1001   [MAR Hold] simvastatin (ZOCOR) tablet 20 mg, 20 mg, Oral, q1800, [name], DO, 20 mg at 01/18/22 1748   [MAR Hold] sodium chloride flush (NS) 0.9 % injection 5 mL, 5 mL, Intracatheter, Q8H, Mugweru, Jon, MD, 5 mL at 01/19/22 0652   Objective:   Vitals:   01/19/22 0810 01/19/22 1425  BP: 124/79 127/84  Pulse: 75 65  Resp: 17 18  Temp: 98.6 F (37 C) 98.8 F (37.1 C)  SpO2: 100% 100%    Physical ExamOpen wound, lower abdomen, exposed inguinal canal, scrotal resection, resection of penile skin, exposed testicles.  Assessment & Plan:  Fournier gangrene  No change in physical exam or indication for surgery. Plan closure of wounds, possible placement of tissue substitute. Will require additional surgery. Will proceed at [facility] request.  Camillia Herter, MD

## 2022-01-19 NOTE — Anesthesia Preprocedure Evaluation (Signed)
Anesthesia Evaluation  Patient identified by MRN, date of birth, ID band Patient awake    Reviewed: Allergy & Precautions, NPO status , Patient's Chart, lab work & pertinent test results  Airway Mallampati: II  TM Distance: >3 FB Neck ROM: Full    Dental  (+) Dental Advisory Given, Edentulous Upper, Edentulous Lower   Pulmonary neg pulmonary ROS   Pulmonary exam normal breath sounds clear to auscultation       Cardiovascular hypertension, Pt. 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  Reproductive/Obstetrics                             Anesthesia Physical Anesthesia Plan  ASA: 3  Anesthesia Plan: General   Post-op Pain Management: Ofirmev IV (intra-op)*   Induction: Intravenous  PONV Risk Score and Plan: 2 and Dexamethasone and Ondansetron  Airway Management Planned: Oral ETT  Additional Equipment:   Intra-op Plan:   Post-operative Plan: Extubation in OR  Informed Consent: I have reviewed the patients History and Physical, chart, labs and discussed the procedure including the risks, benefits and alternatives for the proposed anesthesia with the patient or authorized representative who has indicated his/her understanding and acceptance.     Dental advisory given  Plan Discussed with: CRNA  Anesthesia Plan Comments:        Anesthesia Quick Evaluation

## 2022-01-19 NOTE — Progress Notes (Signed)
Patient is NPO,CHG bath done,mouth care and dressing change done.Patient was unable to sign consent,on-coming nurse informed.

## 2022-01-19 NOTE — Transfer of Care (Signed)
Immediate Anesthesia Transfer of Care Note  Patient: Timothy Davenport  Procedure(s) Performed: Wound debridement, wound closure, possible wound vac, Myriad placement (Penis) APPLICATION OF WOUND VAC (Penis) APPLICATION OF SKIN SUBSTITUTE (Penis)  Patient Location: PACU  Anesthesia Type:General  Level of Consciousness: drowsy and patient cooperative  Airway & Oxygen Therapy: Patient Spontanous Breathing  Post-op Assessment: Report given to RN, Post -op Vital signs reviewed and stable, and Patient moving all extremities  Post vital signs: Reviewed and stable  Last Vitals:  Vitals Value Taken Time  BP    Temp    Pulse 67 01/19/22 1753  Resp 22 01/19/22 1753  SpO2 98 % 01/19/22 1753  Vitals shown include unvalidated device data.  Last Pain:  Vitals:   01/19/22 1437  TempSrc:   PainSc: 0-No pain      Patients Stated Pain Goal: 2 (23/76/28 3151)  Complications: There were no known notable events for this encounter.

## 2022-01-19 NOTE — Progress Notes (Cosign Needed Addendum)
Subjective:  The patient reports he feels fine today. He denies any pain. He has had less mucous production than yesterday. He has had 3-4 bowel movements in the past 24 hours. He could not tell if these bowel movements were loose or normal. He slept well last night.   His wife is at bedside. We discussed upcoming procedure today and dispo options, SNF vs. LTAC.   Objective:  Vital signs in last 24 hours: Vitals:   01/18/22 1603 01/18/22 2129 01/19/22 0353 01/19/22 0810  BP: 120/85 115/77 125/78 124/79  Pulse: 80 77 73 75  Resp: '17 18 16 17  '$ Temp: 98.9 F (37.2 C) 100.1 F (37.8 C) 98.4 F (36.9 C) 98.6 F (37 C)  TempSrc: Oral Oral Oral Oral  SpO2: 100% 100% 100% 100%  Weight:      Height:       Weight change:   Intake/Output Summary (Last 24 hours) at 01/19/2022 1310 Last data filed at 01/19/2022 1210 Gross per 24 hour  Intake 380 ml  Output 740 ml  Net -360 ml   General: Appears to be laying comfortably at bed; No acute distress Cardiovascular: Regular rate and rhythm; no murmurs, rubs, or gallops; 2+ radial and dp pulses Pulmonary: Normal respiratory effort; lungs clear to auscultation anteriorly; no wheezes, rales, or rhonchi  Skin: Warm and dry; Decrease in skin turgor; Wound dressing over perineum, no visible swelling, erythema, bleeding, or drainage beyond the dressing; Scant dark red drainage in IR drain    Neuro: A&O x4; answering questions appropriately    Assessment/Plan:  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 with a pertinent PMH of type 2 diabetes and paraplegia, who presented with worsening scrotal infection and admitted for fournier gangrene while on an SGLT-2i. He is status post 2 debridements and IR guided drain placement of pelvic abscess.   Fournier gangrene s/p  debridement x2 Right seminal vesicle abscess s/p IR drain placement Hemodynamically stable today. The patient is on Cefadroxil/flagyl day 5, total antibiotics course day 15. He has partial wound closure with plastic surgery today at 2:15 pm. Further discussed SNF vs. LTAC today with his wife. Will speak with [facility] to see if they will allow the patient a bed for wound care, and allow our plastic surgery team to perform his future procedures.   The patient's appetite remains poor, and there is concern that he requires far more nutrition than he's currently getting to heal properly, especially if there are further interventions in the future. If the patient is agreeable and accepted to [facility], our next step will be NG tube placement. Will discuss this option with the patient and his wife.  - Appreciate plastics assistance in his care - Plastic surgery today with Dr. Lovena Le at 2:15 pm - Cefadroxil and flagyl day 5, total antibiotics course day 15 - Mirtazapine 15 mg qhs for appetite and sleep - Pain management with tylenol, oxycodone, and dilaudid PRN - Daily wet-to-dry dressings - Diet supplements given - Encourage nutrition and discuss NG tube placement   Hospital delirium   The patient is A&O x 4 today and answers questions appropriately. He reports he slept well on the Mirtazapine last night. Will continue Mirtazapine and delirium precautions.  - Mirtazapine as above - Delirium precautions  Hypophosphatemia Hypokalemia Phosphorous was repleted yesterday. Will repeat BMP tomorrow and replete as needed. Will discuss NG tube placement if accepted to LTAC to improve nutrition and avoid further electrolyte derangements.  - BMP and phosphorus tomorrow   Insulin dependent type 2 diabetes mellitus  CBG normal today at 100.  - novolog SSI  Chronic medical conditions Bladder spasms - continue dantrolene Depression - continue zoloft HLD - continue simvastatin    LOS: 14 days    Timothy Davenport, Medical Student 01/19/2022, 1:10 PM   Attestation for Student Documentation:  I personally was present and performed or re-performed the history, physical exam and medical decision-making activities of this service and have verified that the service and findings are accurately documented in the student's note.  [name], DO 01/19/2022, 5:43 PM

## 2022-01-19 NOTE — Anesthesia Procedure Notes (Signed)
Procedure Name: Intubation Date/Time: 01/19/2022 3:42 PM  Performed by: Ester Rink, CRNAPre-anesthesia Checklist: Patient identified, Emergency Drugs available, Suction available and Patient being monitored Patient Re-evaluated:Patient Re-evaluated prior to induction Oxygen Delivery Method: Circle system utilized Preoxygenation: Pre-oxygenation with 100% oxygen Induction Type: IV induction Ventilation: Mask ventilation without difficulty Laryngoscope Size: Mac and 4 Grade View: Grade I Tube type: Oral Tube size: 7.5 mm Number of attempts: 1 Airway Equipment and Method: Stylet and Oral airway Placement Confirmation: ETT inserted through vocal cords under direct vision, positive ETCO2 and breath sounds checked- equal and bilateral Secured at: 22 cm Tube secured with: Tape Dental Injury: Teeth and Oropharynx as per pre-operative assessment

## 2022-01-20 LAB — BASIC METABOLIC PANEL
Anion gap: 7 (ref 5–15)
BUN: 7 mg/dL — ABNORMAL LOW (ref 8–23)
CO2: 24 mmol/L (ref 22–32)
Calcium: 8.6 mg/dL — ABNORMAL LOW (ref 8.9–10.3)
Chloride: 109 mmol/L (ref 98–111)
Creatinine, Ser: 0.83 mg/dL (ref 0.61–1.24)
GFR, Estimated: >60 mL/min (ref >60)
Glucose, Bld: 169 mg/dL — ABNORMAL HIGH (ref 70–99)
Potassium: 3.3 mmol/L — ABNORMAL LOW (ref 3.5–5.1)
Sodium: 140 mmol/L (ref 135–145)

## 2022-01-20 LAB — CBC
HCT: 25.7 % — ABNORMAL LOW (ref 39.0–52.0)
Hemoglobin: 8.1 g/dL — ABNORMAL LOW (ref 13.0–17.0)
MCH: 25.8 pg — ABNORMAL LOW (ref 26.0–34.0)
MCHC: 31.5 g/dL (ref 30.0–36.0)
MCV: 81.8 fL (ref 80.0–100.0)
Platelets: 273 10*3/uL (ref 150–400)
RBC: 3.14 MIL/uL — ABNORMAL LOW (ref 4.22–5.81)
RDW: 19 % — ABNORMAL HIGH (ref 11.5–15.5)
WBC: 5.1 10*3/uL (ref 4.0–10.5)
nRBC: 0 % (ref 0.0–0.2)

## 2022-01-20 LAB — MAGNESIUM: Magnesium: 1.7 mg/dL (ref 1.7–2.4)

## 2022-01-20 LAB — GLUCOSE, CAPILLARY
Glucose-Capillary: 133 mg/dL — ABNORMAL HIGH (ref 70–99)
Glucose-Capillary: 200 mg/dL — ABNORMAL HIGH (ref 70–99)
Glucose-Capillary: 230 mg/dL — ABNORMAL HIGH (ref 70–99)
Glucose-Capillary: 131 mg/dL — ABNORMAL HIGH (ref 70–99)

## 2022-01-20 LAB — PHOSPHORUS: Phosphorus: 2.3 mg/dL — ABNORMAL LOW (ref 2.5–4.6)

## 2022-01-20 MED ORDER — POTASSIUM PHOSPHATES 15 MMOLE/5ML IV SOLN
15.0000 mmol | Freq: Once | INTRAVENOUS | Status: AC
  Administered 2022-01-20: 15 mmol via INTRAVENOUS
  Filled 2022-01-20: qty 5

## 2022-01-20 NOTE — Progress Notes (Signed)
HD#15 Subjective:   Summary: Timothy Davenport is a 71 y.o. male  with PMH of type 2 diabetes and paraplegia, who presented with worsening scrotal infection and admitted for fournier gangrene while on an SGLT-2i. He is status post 2 debridements and IR guided drain placement of pelvic abscess.   Overnight Events: none  Patient reports no new concerns. Denies any pain. Son was at bedside today. Discussed working on eating today. He ate some eggs and bacon this morning.   Objective:  Vital signs in last 24 hours: Vitals:   01/19/22 1856 01/19/22 2023 01/19/22 2354 01/20/22 0542  BP: 116/80 113/85 105/74 129/86  Pulse: 76 74 95 71  Resp: '17 18 18 18  '$ Temp:  98.3 F (36.8 C) 98.5 F (36.9 C) 98.9 F (37.2 C)  TempSrc:  Oral Oral Oral  SpO2: 100% 100% 100%   Weight:      Height:        Physical Exam:  Constitutional: alert and laying comfortably in bed, in no acute distress HENT: normocephalic atraumatic Neck: supple Pulmonary/Chest: normal work of breathing on room air Abdominal: soft, non-distended, clean dressing over groin with no swelling, erythema, bleeding or drainage Neurological: alert & oriented x 3 Skin: warm and dry Psych: normal mood and behavior  Filed Weights   01/05/22 1145 01/09/22 0953 01/19/22 1425  Weight: 77.1 kg 77.1 kg 78 kg     Intake/Output Summary (Last 24 hours) at 01/20/2022 0650 Last data filed at 01/19/2022 2357 Gross per 24 hour  Intake 1000 ml  Output 725 ml  Net 275 ml   Net IO Since Admission: 522.59 mL [01/20/22 0650]  Pertinent Labs:    Latest Ref Rng & Units 01/20/2022    1:56 AM 01/18/2022    5:14 AM 01/17/2022    4:21 AM  CBC  WBC 4.0 - 10.5 K/uL 5.1  7.7  7.1   Hemoglobin 13.0 - 17.0 g/dL 8.1  8.7  8.0   Hematocrit 39.0 - 52.0 % 25.7  28.7  26.1   Platelets 150 - 400 K/uL 273  332  332        Latest Ref Rng & Units 01/20/2022    1:56 AM 01/18/2022    5:14 AM 01/17/2022    4:21 AM  CMP  Glucose 70 - 99 mg/dL 169   160  114   BUN 8 - 23 mg/dL '7  7  7   '$ Creatinine 0.61 - 1.24 mg/dL 0.83  0.83  0.93   Sodium 135 - 145 mmol/L 140  143  139   Potassium 3.5 - 5.1 mmol/L 3.3  3.5  2.6   Chloride 98 - 111 mmol/L 109  107  103   CO2 22 - 32 mmol/L '24  27  29   '$ Calcium 8.9 - 10.3 mg/dL 8.6  9.5  8.9     Imaging: No results found.  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)   Neurogenic bladder   Spasticity   Diabetes mellitus type 2 in nonobese [facility])   Acute delirium   Patient Summary: Timothy Davenport is a 71 y.o. with a pertinent PMH of type 2 diabetes and paraplegia, who presented with worsening scrotal infection and admitted for fournier gangrene while on an SGLT-2i. He is status post 2 debridements and IR guided drain placement of pelvic abscess.    Fournier gangrene s/p debridement x2 and partial wound closure  Right seminal vesicle abscess s/p IR drain placement PO day 1 after partial wound closure by plastic surgery. No complications noted. Hemodynamically stable. Continuing cefadroxil and flagyl. Will continue discussion regarding SNF vs LTAC with patient and family. Will discuss with surgery team about [facility] option and the logistics since patient will require further surgical interventions. Patient's appetite remains poor but he was able to eat some of his breakfast today. Discussed concern of poor nutrition and wound healing. May consider NG tube for feeding if necessary and if he goes to [facility].  -appreciate urology, general surgery and plastic surgery for their assistance in his care -cefadroxil and flagyl day 6, total antibiotics course day 16, appreciate ID assistance -right TG drain in place, appreciate IR assistance -mirtazapine 15 mg qhs for appetite and sleep -continue wound dressing care -continue dietary supplements  -pain management PRN   Hospital delirium   Patient answering questions appropriately  today. Has been sleeping well with the mirtazapine. Will continue Mirtazapine and delirium precautions.  -Mirtazapine as above -Delirium precautions     Hypophosphatemia, improving Hypokalemia, improving Potassium 3.3 and phosphorus 2.3. Replete today. Working on po intake and discussed with patient and family about importance of nutrition and wound healing. May need to consider NG tube placement if nutrition continues to be poor and if accepted at [facility].  -potassium phosphate 15 mmol today -repeat BMP in 2 days   Insulin dependent type 2 diabetes mellitus  CBG in target range. Will continue with SSI and working on po intake.  -SSI ACHS   Chronic medical conditions Bladder spasms - continue dantrolene Depression - continue zoloft HLD - continue simvastatin  Diet: Carb-Modified IVF: None,None VTE: Enoxaparin Code: Full PT/OT recs:  SNF Family Update: son was at bedside today, awaiting SNF vs LTAC decision   Dispo: Anticipated discharge to  SNF vs LTAC  pending medical stability and decision by family.   [name], DO Internal Medicine Resident PGY-1 Pager: [phone] Please contact the on call pager after 5 pm and on weekends at [phone].

## 2022-01-20 NOTE — Progress Notes (Signed)
Pt seen and examined. He reports feeling well, no complaints since surgery.  Dressings are clean.  Dressing changes to begin today. Dressings will be changed down to the Sorbact. Surgilube on the sorbact then saline gauze on the wounds.  Anticipate next surgical intervention to be skin grafting in approximately two weeks.  Pt may be at any facility capable of dressing changes until that time.

## 2022-01-20 NOTE — Anesthesia Postprocedure Evaluation (Signed)
Anesthesia Post Note  Patient: Timothy Davenport  Procedure(s) Performed: Wound debridement, wound closure, possible wound vac, Myriad placement (Penis) APPLICATION OF SKIN SUBSTITUTE (Penis)     Patient location during evaluation: PACU Anesthesia Type: General Level of consciousness: awake and alert Pain management: pain level controlled Vital Signs Assessment: post-procedure vital signs reviewed and stable Respiratory status: spontaneous breathing, nonlabored ventilation and respiratory function stable Cardiovascular status: stable and blood pressure returned to baseline Anesthetic complications: no   There were no known notable events for this encounter.  Last Vitals:  Vitals:   01/19/22 2354 01/20/22 0542  BP: 105/74 129/86  Pulse: 95 71  Resp: 18 18  Temp: 36.9 C 37.2 C  SpO2: 100%     Last Pain:  Vitals:   01/20/22 0542  TempSrc: Oral  PainSc:                  Thomas E Brock      

## 2022-01-21 LAB — GLUCOSE, CAPILLARY
Glucose-Capillary: 324 mg/dL — ABNORMAL HIGH (ref 70–99)
Glucose-Capillary: 159 mg/dL — ABNORMAL HIGH (ref 70–99)
Glucose-Capillary: 129 mg/dL — ABNORMAL HIGH (ref 70–99)
Glucose-Capillary: 161 mg/dL — ABNORMAL HIGH (ref 70–99)

## 2022-01-21 NOTE — Progress Notes (Addendum)
Subjective:  The patient reports he feels well today. He believes his pain is well controlled. He is attempting to eat more to sustain his nutrition; he ate 2 pieces of toast and a danish for breakfast, and plans to eat lunch. His last bowel movement was 2 hours ago, and it was a normal bowel movement. He is also passing a lot of gas.   Objective:  Vital signs in last 24 hours: Vitals:   01/20/22 1634 01/20/22 2101 01/21/22 0403 01/21/22 0802  BP: 107/72 118/79 121/79 103/72  Pulse: 89 80 85 94  Resp: '16 16 18 17  '$ Temp: 99.8 F (37.7 C) 99.6 F (37.6 C) 98.2 F (36.8 C) 99.1 F (37.3 C)  TempSrc: Oral Oral Oral   SpO2: 100% 100% 100% 100%  Weight:      Height:       Weight change:   Intake/Output Summary (Last 24 hours) at 01/21/2022 1150 Last data filed at 01/21/2022 0600 Gross per 24 hour  Intake 15 ml  Output 835 ml  Net -820 ml   General: Appears to be laying comfortably in bed; No acute distress Cardiovascular: RRR; No murmurs, rubs, or gallops; 2+ radial pulses Pulmonary: Normal respiratory effort; Lungs clear to auscultation anteriorly; no wheezing, rhonchi, or rales Abdomen: soft, non-distended, clean dressing over groin with no swelling, erythema, bleeding or drainage  Skin: warm and dry Neuro: Alert and oriented x3; Answers questions appropriately    Assessment/Plan:  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 with a pertinent PMH of type 2 diabetes and paraplegia, who presented with worsening scrotal infection and admitted for fournier gangrene while on an SGLT-2i. He is status post 2 debridements and IR guided drain placement of pelvic abscess.    Fournier gangrene s/p debridement x2 and partial wound closure Right seminal vesicle abscess s/p IR drain placement The patient is 2  days status post plastic surgery wound closure. His pain is well controlled, and he is hemodynamically stable. He is on cefadroxil and flagyl day 7, total antibiotics course day 17. Still in discussion about SNF vs. LTAC with patient, his wife, and surgery team. The patient's PO intake is improving, will continue to monitor BMP and closely monitor intake to determine if NG tube is still necessary. -appreciate urology, general surgery and plastic surgery for their assistance in his care -cefadroxil and flagyl day 7, total antibiotics course day 17, appreciate ID assistance -right TG drain in place, appreciate IR assistance -mirtazapine 15 mg qhs for appetite and sleep -continue wound dressing care -continue dietary supplements  -pain management PRN  Hospital delirium   Patient answering questions appropriately today. Has been sleeping well with the mirtazapine. Will continue Mirtazapine and delirium precautions.  -Mirtazapine as above -Delirium precautions    Hypophosphatemia, improving Hypokalemia, improving Potassium and phosphorus were repleted yesterday. PO intake appears to be improving, per patient. Will recheck BMP Monday, 12/4, and replete as needed. May need to consider NG tube placement if nutrition continues to be poor and if accepted at [facility].  - BMP tomorrow   Insulin dependent type 2 diabetes mellitus  CBG this morning was 324, after a reportedly heavy breakfast of toast and pastries. He received 7 units of SSI. Will continue to monitor CBG trend throughout the day. If PO  intake continues to improve with rising CBG, will consider restarting long-acting insulin in addition to SSI. -SSI ACHS   Chronic medical conditions Bladder spasms - continue dantrolene Depression - continue zoloft HLD - continue simvastatin    LOS: 16 days   [name], Medical Student 01/21/2022, 11:50 AM   Attestation for Student Documentation:  I personally was present and performed or  re-performed the history, physical exam and medical decision-making activities of this service and have verified that the service and findings are accurately documented in the student's note.  [name], DO 01/21/2022, 12:52 PM

## 2022-01-22 ENCOUNTER — Other Ambulatory Visit: Payer: Self-pay

## 2022-01-22 ENCOUNTER — Encounter (HOSPITAL_COMMUNITY): Payer: Self-pay | Admitting: Plastic Surgery

## 2022-01-22 DIAGNOSIS — Z794 Long term (current) use of insulin: Secondary | ICD-10-CM

## 2022-01-22 DIAGNOSIS — E1152 Type 2 diabetes mellitus with diabetic peripheral angiopathy with gangrene: Secondary | ICD-10-CM

## 2022-01-22 LAB — BASIC METABOLIC PANEL
Anion gap: 6 (ref 5–15)
BUN: 9 mg/dL (ref 8–23)
CO2: 25 mmol/L (ref 22–32)
Calcium: 8.9 mg/dL (ref 8.9–10.3)
Chloride: 106 mmol/L (ref 98–111)
Creatinine, Ser: 0.87 mg/dL (ref 0.61–1.24)
GFR, Estimated: >60 mL/min (ref >60)
Glucose, Bld: 250 mg/dL — ABNORMAL HIGH (ref 70–99)
Potassium: 3.2 mmol/L — ABNORMAL LOW (ref 3.5–5.1)
Sodium: 137 mmol/L (ref 135–145)

## 2022-01-22 LAB — CBC
HCT: 27.2 % — ABNORMAL LOW (ref 39.0–52.0)
Hemoglobin: 8.3 g/dL — ABNORMAL LOW (ref 13.0–17.0)
MCH: 25.3 pg — ABNORMAL LOW (ref 26.0–34.0)
MCHC: 30.5 g/dL (ref 30.0–36.0)
MCV: 82.9 fL (ref 80.0–100.0)
Platelets: 243 10*3/uL (ref 150–400)
RBC: 3.28 MIL/uL — ABNORMAL LOW (ref 4.22–5.81)
RDW: 18.8 % — ABNORMAL HIGH (ref 11.5–15.5)
WBC: 6.7 10*3/uL (ref 4.0–10.5)
nRBC: 0 % (ref 0.0–0.2)

## 2022-01-22 LAB — GLUCOSE, CAPILLARY
Glucose-Capillary: 191 mg/dL — ABNORMAL HIGH (ref 70–99)
Glucose-Capillary: 247 mg/dL — ABNORMAL HIGH (ref 70–99)
Glucose-Capillary: 241 mg/dL — ABNORMAL HIGH (ref 70–99)
Glucose-Capillary: 118 mg/dL — ABNORMAL HIGH (ref 70–99)

## 2022-01-22 LAB — PHOSPHORUS: Phosphorus: 1.7 mg/dL — ABNORMAL LOW (ref 2.5–4.6)

## 2022-01-22 MED ORDER — POTASSIUM PHOSPHATES 15 MMOLE/5ML IV SOLN
30.0000 mmol | Freq: Once | INTRAVENOUS | Status: AC
  Administered 2022-01-22: 30 mmol via INTRAVENOUS
  Filled 2022-01-22: qty 10

## 2022-01-22 NOTE — Progress Notes (Addendum)
Referring Physician(s): Dr Evette Doffing  Supervising Physician: Aletta Edouard  Patient Status:  [facility] - In-pt  Chief Complaint:  Fournier's gangrene s/p surgical debridement. Post-procedure pelvic abscess now with drain placed in IR 01/12/22 by Dr Maryelizabeth Kaufmann  Subjective:  Patient awake and alert, lying in bed watching TV at time of exam. He states he may be discharged from the hospital today. No questions or concerns regarding his drain.  Allergies: Jardiance [empagliflozin], Lisinopril, and Enalapril-hctz [enalapril-hydrochlorothiazide]  Medications: Prior to Admission medications   Medication Sig Start Date End Date Taking? Authorizing Provider  Alogliptin Benzoate 25 MG TABS Take 12.5 tablets by mouth daily with breakfast.   Yes [provider]  Ascorbic Acid 500 MG CAPS Take 500 mg by mouth every Monday, Wednesday, and Friday. 05/03/21  Yes [provider]  aspirin EC 81 MG tablet Take 1 tablet (81 mg total) by mouth daily. Swallow whole. 04/30/21  Yes Elgergawy, Silver Huguenin, MD  baclofen (LIORESAL) 20 MG tablet Take 20 mg by mouth every 8 (eight) hours. 01/11/20  Yes [provider]  cefadroxil (DURICEF) 500 MG capsule Take 1 capsule (500 mg total) by mouth 2 (two) times daily. 01/03/22  Yes Prosperi, Christian H, PA-C  Cholecalciferol 100 MCG (4000 UT) TABS Take 4,000 Units by mouth daily. 01/10/21  Yes [provider]  dantrolene (DANTRIUM) 25 MG capsule Take 75 mg by mouth 2 (two) times daily.   Yes [provider]  docusate sodium (COLACE) 50 MG capsule Take 50 mg by mouth daily.   Yes [provider]  empagliflozin (JARDIANCE) 25 MG TABS tablet Take 12.5 mg by mouth daily. 10/20/20  Yes [provider]  ferrous gluconate (FERGON) 324 MG tablet Take 324 mg by mouth every Monday, Wednesday, and Friday. 05/03/21  Yes [provider]  folic acid (FOLVITE) 1 MG tablet Take 1 mg by mouth daily. 12/06/21  Yes [provider]  gabapentin (NEURONTIN) 400 MG capsule Take 800 mg by mouth 3 (three) times daily. 01/28/20  Yes [provider]  glucose 4 GM chewable tablet Chew 4 tablets by mouth as needed for low blood sugar.   Yes [provider]  insulin glargine-yfgn (SEMGLEE) 100 UNIT/ML Pen Inject 10 Units into the skin at bedtime. 12/06/21  Yes [provider]  losartan (COZAAR) 50 MG tablet Take 75 mg by mouth daily. 12/06/21  Yes [provider]  omeprazole (PRILOSEC) 20 MG capsule Take 20 mg by mouth daily. 07/31/21  Yes [provider]  oxybutynin (DITROPAN) 5 MG tablet Take 5 mg by mouth 2 (two) times daily.   Yes [provider]  pioglitazone (ACTOS) 30 MG tablet Take 30 mg by mouth daily.   Yes [provider]  sertraline (ZOLOFT) 100 MG tablet Take 100 mg by mouth daily.   Yes [provider]  simvastatin (ZOCOR) 20 MG tablet Take 20 mg by mouth daily at 6 PM.   Yes [provider]  acetaminophen (TYLENOL) 325 MG tablet Take 1-2 tablets (325-650 mg total) by mouth every 4 (four) hours as needed for mild pain. Patient not taking: Reported on 01/06/2022 07/20/19   Love, Ivan Anchors, PA-C  bisacodyl (DULCOLAX) 10 MG suppository UNWRAP AND INSERT 1 SUPPOSITORY(10 MG) RECTALLY DAILY AFTER SUPPER Patient not taking: Reported on 01/06/2022 08/21/19   Raulkar, Clide Deutscher, MD  dantrolene (DANTRIUM) 100 MG capsule Take 1 capsule (100 mg total) by mouth 3 (three) times daily. Patient not taking: Reported on 01/06/2022 03/08/21  Lovorn, Jinny Blossom, MD  polyvinyl alcohol (LIQUIFILM TEARS) 1.4 % ophthalmic solution Place 1 drop into both eyes 4 (four) times daily. Patient not taking: Reported on 01/06/2022 07/20/19   Bary Leriche, PA-C     Vital Signs: BP 120/77 (BP Location: Right Arm)   Pulse 67   Temp 98.9 F (37.2 C) (Oral)   Resp 16   Ht '5\' 11"'$  (1.803 m)   Wt 171 lb 15.3 oz (78 kg)   SpO2 100%   BMI 23.98 kg/m   Physical  Exam Vitals reviewed.  Constitutional:      General: He is not in acute distress. Pulmonary:     Effort: Pulmonary effort is normal.  Abdominal:     Tenderness: There is no abdominal tenderness.  Skin:    General: Skin is warm and dry.     Comments: Right transgluteal drain in place. ~52m of serous fluid in bulb at time of exam. Dressing not in place over drain insertion site, RN notified. Flushes/aspirates easily. No drainage, erythema, induration, or pain noted at drain insertion site.  Neurological:     Mental Status: He is alert and oriented to person, place, and time.  Psychiatric:        Mood and Affect: Mood normal.        Behavior: Behavior normal.    Drain Location: Right transgluteal Size: Fr size: 10 Fr Date of placement: 01/12/22  Currently to: Drain collection device: suction bulb 24 hour output:  Output by Drain (mL) 01/20/22 0701 - 01/20/22 1900 01/20/22 1901 - 01/21/22 0700 01/21/22 0701 - 01/21/22 1900 01/21/22 1901 - 01/22/22 0700 01/22/22 0701 - 01/22/22 1044  Closed System Drain 1 Right Buttock Bulb (JP) 30 5       Interval imaging/drain manipulation:  None  Current examination: Flushes/aspirates easily.  Insertion site unremarkable. Suture in place. Dressing was not in place over the drain insertion site. Dressing was lateral to drain insertion site.  Approximately 519mof serous fluid in JP bulb at time of exam.      Imaging: No results found.  Labs:  CBC: Recent Labs    01/17/22 0421 01/18/22 0514 01/20/22 0156 01/22/22 0330  WBC 7.1 7.7 5.1 6.7  HGB 8.0* 8.7* 8.1* 8.3*  HCT 26.1* 28.7* 25.7* 27.2*  PLT 332 332 273 243    COAGS: Recent Labs    04/24/21 1410  INR 1.0    BMP: Recent Labs    01/17/22 0421 01/18/22 0514 01/20/22 0156 01/22/22 0330  NA 139 143 140 137  K 2.6* 3.5 3.3* 3.2*  CL 103 107 109 106  CO2 '29 27 24 25  '$ GLUCOSE 114* 160* 169* 250*  BUN 7* 7* 7* 9  CALCIUM 8.9 9.5 8.6* 8.9  CREATININE 0.93 0.83 0.83  0.87  GFRNONAA >60 >60 >60 >60    LIVER FUNCTION TESTS: Recent Labs    03/08/21 1137 04/24/21 0921 01/05/22 0900  BILITOT 0.2 0.3 0.6  AST 16 12* 16  ALT '8 11 12  '$ ALKPHOS 82 51 71  PROT 7.9 7.3 7.7  ALBUMIN 3.9 3.5 2.6*    Assessment and Plan:  Patient has Fournier's Gangrene s/p surgical debridement. Patient developed post-procedure pelvic abscess and had drain placed in IR 01/12/22 by Dr MuMaryelizabeth KaufmannWBC 6.7, Tmax of 99.1 on 12/3, fluid culture from 11/24 revealed Citrobacter koseri. Today, dressing was not in place, nor had output been recorded for 12/3. Dressing placed by RN, and RN asked to ensure documentation of drainage amounts  from JP bulb. ~5 mL of serous output in JP bulb at time of exam. IR will continue to follow  Plan: Continue TID flushes with 5 cc NS. Record output Q shift. Dressing changes QD or PRN if soiled.  Call IR APP or on call IR MD if difficulty flushing or sudden change in drain output.  Repeat imaging/possible drain injection once output < 10 mL/QD (excluding flush material). Consideration for drain removal if output is < 10 mL/QD (excluding flush material), pending discussion with the providing surgical service.  Discharge planning: Patient to be discharged possibly today. Outpatient follow-up orders placed for patient to be seen in [facility]. Patient instructed to flush drain once daily and to record output daily.   Electronically Signed: Lura Em, PA-C 01/22/2022, 10:36 AM   I spent a total of 15 Minutes at the the patient's bedside AND on the patient's hospital floor or unit, greater than 50% of which was counseling/coordinating care for Fournier's gangrene s/p pelvic abscess drain placement 01/12/22.

## 2022-01-22 NOTE — Progress Notes (Signed)
Pt pulled peripheral IV out on previous shift. Bilateral mittens placed at this time. Pt is calm, cooperative and A&O to self and place, stating he feels confused. IV team consult places, pt agreeable to plan.

## 2022-01-22 NOTE — Progress Notes (Signed)
Physical Therapy Treatment Patient Details Name: Timothy Davenport MRN: [id number] DOB: 1950/03/19 Today's Date: 01/22/2022   History of Present Illness Pt is a 71 yo male admitted for gangrene of the scrotum.  Pt with excisional debridement of B groin and lower abdomen. Third I&D of wound on 11/21.  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 PHM: T8 incomplete paraplegia with neurogenic bladder.    PT Comments    Pt seen for PT tx with pt agreeable to tx. Pt received sitting semi fowler in bed with gown off shoulders & BUE mittens in lap. Pt is AxOx4 but states this PT gave him his mittens & does demonstrate impaired overall awareness, safety awareness, & recall. Pt is able to complete supine>sit with mod assist, sit>supine with max assist & STS x 3 from elevated EOB with max assist to stedy. Pt is able to clear buttocks but requires cuing/assistance to upright trunk vs forward flexion & at most is able to tolerate standing ~60 seconds. Pt incontinent of BM & requires total assist for peri hygiene. Pt is making progress as he was able to complete all mobility tasks with +1 assist on this date but anticipate he would require +2 assist for STS attempts with RW. Will continue to follow pt acutely to address strengthening, balance, and transfers.  At beginning of session, pt asking if he got a shot in his back as he feels a band of sensation/?pain across mid back but once pt was sitting EOB pt declined this feeling.    Recommendations for follow up therapy are one component of a multi-disciplinary discharge planning process, led by the attending physician.  Recommendations may be updated based on patient status, additional functional criteria and insurance authorization.  Follow Up Recommendations  Skilled nursing-short term rehab (<3 hours/day) Can patient physically be transported by private vehicle: No   Assistance Recommended at Discharge Frequent or constant Supervision/Assistance  Patient can return home  with the following Two people to help with walking and/or transfers;Two people to help with bathing/dressing/bathroom;Direct supervision/assist for medications management;Help with stairs or ramp for entrance;Assist for transportation   Equipment Recommendations  None recommended by PT    Recommendations for Other Services       Precautions / Restrictions Precautions Precautions: Fall;Other (comment) Precaution Comments: significant abdominal/groin wound, JP drain (R low back/hip) Other Brace: bulky dressing to groin area held in place with mesh underwear, JP drain to R low back/buttock area Restrictions Weight Bearing Restrictions: No Other Position/Activity Restrictions: Avoid shearing during transfers and bed mobility due to groin debridement.     Mobility  Bed Mobility Overal bed mobility: Needs Assistance Bed Mobility: Rolling Rolling: Supervision, Min guard   Supine to sit: Mod assist, HOB elevated (some assistance to move BLE to EOB, upright trunk) Sit to supine: Max assist, HOB elevated (assistance to elevate BLE onto bed)   General bed mobility comments: pt able to scoot to [facility] without assistance with use of bed rails.    Transfers Overall transfer level: Needs assistance Equipment used: Ambulation equipment used Transfers: Sit to/from Stand Sit to Stand: Max assist           General transfer comment: Pt completes STS x 3 from elevated EOB into stedy using stedy plate to block BLE knees. Pt is able to clear buttocks but requires MAX cuing to upright trunk vs forward trunk flexion. Unable to fully extend BLE hips/knees in standing. Transfer via Lift Equipment: Stedy  Ambulation/Gait  Stairs             Wheelchair Mobility    Modified Rankin (Stroke Patients Only)       Balance Overall balance assessment: Needs assistance Sitting-balance support: Feet supported, Bilateral upper extremity supported Sitting balance-Leahy  Scale: Poor     Standing balance support: During functional activity, Bilateral upper extremity supported, Reliant on assistive device for balance Standing balance-Leahy Scale: Zero                              Cognition Arousal/Alertness: Awake/alert Behavior During Therapy: WFL for tasks assessed/performed Overall Cognitive Status: Impaired/Different from baseline Area of Impairment: Awareness, Memory, Safety/judgement, Problem solving                     Memory: Decreased recall of precautions, Decreased short-term memory   Safety/Judgement: Decreased awareness of safety, Decreased awareness of deficits Awareness: Emergent, Anticipatory Problem Solving: Requires verbal cues General Comments: Pt is AxOx4 but demonstrates poor safety awareness & poor overall awareness. Difficult to understand pt ~60% of the time with verbal communication.        Exercises      General Comments        Pertinent Vitals/Pain Pain Assessment Pain Assessment: No/denies pain    Home Living                          Prior Function            PT Goals (current goals can now be found in the care plan section) Acute Rehab PT Goals PT Goal Formulation: With patient Time For Goal Achievement: 02/05/22 Potential to Achieve Goals: Good Progress towards PT goals: Progressing toward goals    Frequency    Min 3X/week      PT Plan Current plan remains appropriate    Co-evaluation              AM-PAC PT "6 Clicks" Mobility   Outcome Measure  Help needed turning from your back to your side while in a flat bed without using bedrails?: A Little Help needed moving from lying on your back to sitting on the side of a flat bed without using bedrails?: A Lot Help needed moving to and from a bed to a chair (including a wheelchair)?: Total Help needed standing up from a chair using your arms (e.g., wheelchair or bedside chair)?: Total Help needed to walk in  hospital room?: Total Help needed climbing 3-5 steps with a railing? : Total 6 Click Score: 9    End of Session Equipment Utilized During Treatment: Gait belt Activity Tolerance: Patient tolerated treatment well Patient left: in bed;with call bell/phone within reach;with bed alarm set (telesitter in room) Nurse Communication:  (mittens being off upon PT arrival, pt had BM, IV beeping 2/2 occlusion) PT Visit Diagnosis: Other abnormalities of gait and mobility (R26.89);Muscle weakness (generalized) (M62.81)     Time: 1700-1749 PT Time Calculation (min) (ACUTE ONLY): 33 min  Charges:  $Therapeutic Activity: 23-37 mins                     Timothy Davenport, PT, DPT 01/22/22, 3:38 PM   [name] 01/22/2022, 3:35 PM

## 2022-01-22 NOTE — Progress Notes (Signed)
Arrived to restart PIV. Primary RN Heloise Purpura asked that we wait until after MD rounds as the patient doesn't have IV meds or fluids. She stated staff would reenter a consult if PIV was needed after MD rounds. VAST to respond if/when consult reentered.

## 2022-01-22 NOTE — Progress Notes (Cosign Needed Addendum)
Subjective:  The patient reports she feels well today. His pain is well controlled. He ate most of his breakfast this morning, as well as lunch and dinner yesterday. His last bowel movement was this morning, and it was normal. He reports she was feeling a little confused early this morning, and he feels less confused now.   Objective:  Vital signs in last 24 hours: Vitals:   01/21/22 1642 01/21/22 2225 01/22/22 0529 01/22/22 0735  BP: 112/72 125/82 105/76 120/77  Pulse: 87 91 78 67  Resp: '19 17 18 16  '$ Temp:  99.1 F (37.3 C) 98.7 F (37.1 C) 98.9 F (37.2 C)  TempSrc:  Oral Oral Oral  SpO2: 100% 100% 100% 100%  Weight:      Height:       Weight change:   Intake/Output Summary (Last 24 hours) at 01/22/2022 1328 Last data filed at 01/22/2022 1114 Gross per 24 hour  Intake 360 ml  Output 505 ml  Net -145 ml   General: Appears to be laying comfortably in bed; No acute distress Cardiovascular: RRR; no murmurs, rubs, or gallops; 2+ radial pulses Pulmonary: Normal respiratory effort; Lungs clear to auscultation anteriorly; no wheezing, rhonchi, or rales  Abdomen: Clean dressing over groin with no swelling, erythema, bleeding or drainage   Skin: Warm and dry  Neuro: Alert and oriented x3, oriented to person, place, and situation, not oriented to time (reports the month is November, and the year is 2024); Answers questions appropriately    Assessment/Plan:  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 with a pertinent PMH of type 2 diabetes and paraplegia, who presented with worsening scrotal infection and admitted for fournier gangrene while on an SGLT-2i. He is status post 2 debridements and IR guided drain placement of pelvic abscess.     Fournier gangrene s/p debridement x2 and partial wound  closure Right seminal vesicle abscess s/p IR drain placement The patient is hemodynamically stable. His pain is well controlled. He is on cefadroxil and flagyl day 8, total antibiotics course day 18. IR has consulted and recommends drain removal if output is <25m/QD, pending discussion with surgery. He will follow-up with the IR drain clinic outpatient. ID recommends continuing cefadroxil and flagyl for 3 weeks, with follow-up on 12/14 scheduled with Dr. VGale Journey He and his wife have decided to move forward with [facility]. CSW is working on [employer] His PO intake continues to improve. [facility] has been notified, and will continue to monitor nutrition. They are equipped to place NG tube if necessary. -appreciate urology, general surgery and plastic surgery for their assistance in his care -cefadroxil and flagyl day 8, total antibiotics course day 18, appreciate ID assistance -right TG drain in place, appreciate IR assistance -mirtazapine 15 mg qhs for appetite and sleep -continue wound dressing care -continue dietary supplements  -pain management PRN - CSW - [facility] disposition   Hospital delirium   Patient was a little confused this morning. He is answering questions appropriately today. Has been sleeping well with the mirtazapine. Will continue Mirtazapine and delirium precautions.  -Mirtazapine as above -Delirium precautions    Hypophosphatemia Hypokalemia Potassium today is 3.2, phosphorous 1.7. He reports improved nutrition and nursing staff has agreed to closely monitor his intake. [facility] is equipped  to place NG tube if necessary. Will replete with potassium phosphate and repeat BMP tomorrow. - IV potassium phosphate 30 mmol - BMP tomorrow   Insulin dependent type 2 diabetes mellitus  Morning CBG today was 191, then later, 247. Morning CBGs have been overall stable with a few meal time spikes over the past couple days. Will continue to monitor CBG trends.  If PO  intake continues to improve with rising CBG, will consider restarting long-acting insulin in addition to SSI.  -SSI ACHS   Chronic medical conditions Bladder spasms - continue dantrolene Depression - continue zoloft HLD - continue simvastatin      LOS: 17 days   [name], Medical Student 01/22/2022, 1:28 PM   Attestation for Student Documentation:  I personally was present and performed or re-performed the history, physical exam and medical decision-making activities of this service and have verified that the service and findings are accurately documented in the student's note.  [name], DO 01/22/2022, 4:00 PM

## 2022-01-22 NOTE — Progress Notes (Signed)
[facility]   Reason for visit: Follow up on Fornier's gangrene  Interval History: plan for discharge today or soon; drain remains in place and scheduled for the drain clinic for follow up; WBC 6.7.   Day 20 total antibibiotics  Physical Exam: Constitutional:  Vitals:   01/22/22 0529 01/22/22 0735  BP: 105/76 120/77  Pulse: 78 67  Resp: 18 16  Temp: 98.7 F (37.1 C) 98.9 F (37.2 C)  SpO2: 100% 100%   patient appears in NAD Respiratory: Normal respiratory effort; CTA B   Review of Systems: Constitutional: negative for fevers and chills  Lab Results  Component Value Date   WBC 6.7 01/22/2022   HGB 8.3 (L) 01/22/2022   HCT 27.2 (L) 01/22/2022   MCV 82.9 01/22/2022   PLT 243 01/22/2022    Lab Results  Component Value Date   CREATININE 0.87 01/22/2022   BUN 9 01/22/2022   NA 137 01/22/2022   K 3.2 (L) 01/22/2022   CL 106 01/22/2022   CO2 25 01/22/2022    Lab Results  Component Value Date   ALT 12 01/05/2022   AST 16 01/05/2022   ALKPHOS 71 01/05/2022     Microbiology: Recent Results (from the past 240 hour(s))  Aerobic/Anaerobic Culture w Gram Stain (surgical/deep wound)     Status: None   Collection Time: 01/12/22  2:38 PM   Specimen: Abscess  Result Value Ref Range Status   Specimen Description ABSCESS  Final   Special Requests PELVIC ASPIRATE  Final   Gram Stain   Final    ABUNDANT WBC PRESENT, PREDOMINANTLY PMN RARE GRAM NEGATIVE RODS    Culture   Final    RARE CITROBACTER KOSERI NO ANAEROBES ISOLATED Performed at [facility], [address] [place], [place] [address]    Report Status 01/17/2022 FINAL  Final   Organism ID, Bacteria CITROBACTER KOSERI  Final      Susceptibility   Citrobacter koseri - MIC*    CEFAZOLIN <=4 SENSITIVE Sensitive     CEFEPIME <=0.12 SENSITIVE Sensitive     CEFTAZIDIME <=1 SENSITIVE Sensitive     CEFTRIAXONE <=0.25 SENSITIVE Sensitive     CIPROFLOXACIN <=0.25 SENSITIVE  Sensitive     GENTAMICIN <=1 SENSITIVE Sensitive     IMIPENEM <=0.25 SENSITIVE Sensitive     TRIMETH/SULFA <=20 SENSITIVE Sensitive     PIP/TAZO <=4 SENSITIVE Sensitive     * RARE CITROBACTER KOSERI  Surgical pcr screen     Status: None   Collection Time: 01/19/22 12:22 AM   Specimen: Nasal Mucosa; Nasal Swab  Result Value Ref Range Status   MRSA, PCR NEGATIVE NEGATIVE Final   Staphylococcus aureus NEGATIVE NEGATIVE Final    Comment: (NOTE) The Xpert SA Assay (FDA approved for NASAL specimens in patients 58 years of age and older), is one component of a comprehensive surveillance program. It is not intended to diagnose infection nor to guide or monitor treatment. Performed at [facility], [place] [address], [place], [place] [address]     Impression/Plan:  1. Fournier's gangrene - s/p operative debridement by urology and general surgery.  Cultures with Citrobacter and has continued on cefadroxil and metronidazole. Still with drain in place draining purulence.   At this point, I recommend he continue with cefadroxil and metronidazole for 3 weeks and he has follow up arranged with Dr. Gale Journey on 12/14 and can consider stopping then depending on the drain and clinical course.  2.  Abscess - drain in place and to follow up with drain clinic.    I will sign off, call with questions.

## 2022-01-22 NOTE — TOC Progression Note (Addendum)
Transition of Care (TOC) - Progression Note   [place] Student , secure chatted NCM that patient may be cleared for discharge to [facility] today.   [facility] will start [place] today today for possible admission  . Per [place] Student wife prefers [facility]   1300 Confirmed with patient's wife Timothy Davenport [phone] , she is in agreement for [facility] to transfer to [facility] located off [address]. She would like Timothy Davenport with [facility] to give her a call. Message sent to [facility]  Patient Details  Name: Timothy Davenport MRN: [id number] Date of Birth: January 04, 1951  Transition of Care [facility]) CM/SW Contact  Aadil Sur, Edson Snowball, RN Phone Number: 01/22/2022, 8:26 AM  Clinical Narrative:       Expected Discharge Plan: [place] Barriers to Discharge: Continued Medical Work up  Expected Discharge Plan and Services Expected Discharge Plan: [place] In-house Referral: Clinical Social Work     Living arrangements for the past 2 months: Single Family Home                                       Social Determinants of Health (SDOH) Interventions    Readmission Risk Interventions    04/26/2021    1:38 PM  Readmission Risk Prevention Plan  Post Dischage Appt Complete  Medication Screening Complete  Transportation Screening Complete

## 2022-01-23 DIAGNOSIS — E119 Type 2 diabetes mellitus without complications: Secondary | ICD-10-CM

## 2022-01-23 DIAGNOSIS — R410 Disorientation, unspecified: Secondary | ICD-10-CM

## 2022-01-23 LAB — BASIC METABOLIC PANEL
Anion gap: 9 (ref 5–15)
BUN: 7 mg/dL — ABNORMAL LOW (ref 8–23)
CO2: 26 mmol/L (ref 22–32)
Calcium: 9.2 mg/dL (ref 8.9–10.3)
Chloride: 105 mmol/L (ref 98–111)
Creatinine, Ser: 0.75 mg/dL (ref 0.61–1.24)
GFR, Estimated: >60 mL/min (ref >60)
Glucose, Bld: 131 mg/dL — ABNORMAL HIGH (ref 70–99)
Potassium: 3.4 mmol/L — ABNORMAL LOW (ref 3.5–5.1)
Sodium: 140 mmol/L (ref 135–145)

## 2022-01-23 LAB — GLUCOSE, CAPILLARY
Glucose-Capillary: 145 mg/dL — ABNORMAL HIGH (ref 70–99)
Glucose-Capillary: 188 mg/dL — ABNORMAL HIGH (ref 70–99)
Glucose-Capillary: 160 mg/dL — ABNORMAL HIGH (ref 70–99)

## 2022-01-23 LAB — PHOSPHORUS: Phosphorus: 2.2 mg/dL — ABNORMAL LOW (ref 2.5–4.6)

## 2022-01-23 MED ORDER — PANTOPRAZOLE SODIUM 40 MG PO TBEC
40.0000 mg | DELAYED_RELEASE_TABLET | Freq: Every day | ORAL | Status: DC
  Administered 2022-01-23 – 2022-01-24 (×2): 40 mg via ORAL
  Filled 2022-01-23 (×2): qty 1

## 2022-01-23 MED ORDER — POTASSIUM PHOSPHATES 15 MMOLE/5ML IV SOLN
30.0000 mmol | Freq: Once | INTRAVENOUS | Status: AC
  Administered 2022-01-23: 30 mmol via INTRAVENOUS
  Filled 2022-01-23: qty 10

## 2022-01-23 MED ORDER — INSULIN ASPART 100 UNIT/ML IJ SOLN
3.0000 [IU] | Freq: Three times a day (TID) | INTRAMUSCULAR | Status: DC
  Administered 2022-01-23 – 2022-01-24 (×3): 3 [IU] via SUBCUTANEOUS

## 2022-01-23 MED ORDER — GUAIFENESIN ER 600 MG PO TB12
600.0000 mg | ORAL_TABLET | Freq: Two times a day (BID) | ORAL | Status: DC | PRN
  Administered 2022-01-23: 600 mg via ORAL
  Filled 2022-01-23: qty 1

## 2022-01-23 MED ORDER — ONDANSETRON HCL 4 MG/2ML IJ SOLN
4.0000 mg | Freq: Once | INTRAMUSCULAR | Status: AC | PRN
  Administered 2022-01-23: 4 mg via INTRAVENOUS
  Filled 2022-01-23: qty 2

## 2022-01-23 NOTE — Progress Notes (Signed)
Occupational Therapy Treatment Patient Details Name: Timothy Davenport MRN: [id number] DOB: [birth year] Today's Date: 01/23/2022   History of present illness Pt is a 71 yo male admitted for gangrene of the scrotum.  Pt with excisional debridement of B groin and lower abdomen. Third I&D of wound on 11/21.  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 PHM: T8 incomplete paraplegia with neurogenic bladder.   OT comments  Patient making good progress with OT treatment requiring mod assist of one to get to EOB and is able to maintain sitting balance while performing reaching tasks to increase trunk strength. Patient able to stand into Stedy with max assist of one from assist of 2.  Patient would benefit from further OT to continue to address self care goals and progress to OOB transfers. Acute OT to continue to follow.    Recommendations for follow up therapy are one component of a multi-disciplinary discharge planning process, led by the attending physician.  Recommendations may be updated based on patient status, additional functional criteria and insurance authorization.    Follow Up Recommendations  Skilled nursing-short term rehab (<3 hours/day)     Assistance Recommended at Discharge Frequent or constant Supervision/Assistance  Patient can return home with the following  Two people to help with walking and/or transfers;A lot of help with bathing/dressing/bathroom;Assistance with cooking/housework;Assist for transportation;Help with stairs or ramp for entrance   Equipment Recommendations  None recommended by OT    Recommendations for Other Services      Precautions / Restrictions Precautions Precautions: Fall;Other (comment) Precaution Comments: significant abdominal/groin wound, JP drain (R low back/hip) Restrictions Weight Bearing Restrictions: No Other Position/Activity Restrictions: Avoid shearing during transfers and bed mobility due to groin debridement.       Mobility Bed Mobility Overal  bed mobility: Needs Assistance Bed Mobility: Supine to Sit, Sit to Supine     Supine to sit: Mod assist, HOB elevated Sit to supine: Max assist, HOB elevated   General bed mobility comments: assistance with BLEs with improvement on patient assisting with scooting hips towards EOB    Transfers Overall transfer level: Needs assistance Equipment used: Ambulation equipment used Transfers: Sit to/from Stand Sit to Stand: Max assist           General transfer comment: patient performed 4 stands from EOB into stedy with first stand to Stedy pads and 4th stand rested on EOB. Transfer via Lift Equipment: Stedy   Balance Overall balance assessment: Needs assistance Sitting-balance support: Feet supported, Bilateral upper extremity supported Sitting balance-Leahy Scale: Poor Sitting balance - Comments: able to maintain sitting balance without UE support until fatigues and reliant on one extremity support   Standing balance support: During functional activity, Bilateral upper extremity supported, Reliant on assistive device for balance Standing balance-Leahy Scale: Zero Standing balance comment: reliant on Stedy for support when standing with cues for posture                           ADL either performed or assessed with clinical judgement   ADL Overall ADL's : Needs assistance/impaired                                       General ADL Comments: focused on bed mobility, sitting balance, and standing in Stedy to address toilet transfers    Extremity/Trunk Assessment  Vision       Perception     Praxis      Cognition Arousal/Alertness: Awake/alert Behavior During Therapy: WFL for tasks assessed/performed Overall Cognitive Status: Impaired/Different from baseline Area of Impairment: Awareness, Memory, Safety/judgement, Problem solving                     Memory: Decreased recall of precautions, Decreased short-term  memory   Safety/Judgement: Decreased awareness of safety, Decreased awareness of deficits Awareness: Emergent, Anticipatory Problem Solving: Requires verbal cues General Comments: poor safety and required instructions repeated on occasion        Exercises      Shoulder Instructions       General Comments      Pertinent Vitals/ Pain       Pain Assessment Pain Assessment: No/denies pain Pain Intervention(s): Monitored during session  Home Living                                          Prior Functioning/Environment              Frequency  Min 2X/week        Progress Toward Goals  OT Goals(current goals can now be found in the care plan section)  Progress towards OT goals: Progressing toward goals  Acute Rehab OT Goals Patient Stated Goal: get better OT Goal Formulation: With patient Time For Goal Achievement: 01/24/22 Potential to Achieve Goals: Fair ADL Goals Pt Will Perform Lower Body Bathing: with min assist;sitting/lateral leans Pt Will Perform Lower Body Dressing: with supervision;sitting/lateral leans;bed level Pt Will Transfer to Toilet: with min assist;squat pivot transfer;bedside commode Pt Will Perform Toileting - Clothing Manipulation and hygiene: sitting/lateral leans;with min assist Additional ADL Goal #1: Pt will independently state the reasons why cleaning up directly after bowel movement and boosting during trasnfers are important to his wound care routine.  Plan Discharge plan remains appropriate    Co-evaluation                 AM-PAC OT "6 Clicks" Daily Activity     Outcome Measure   Help from another person eating meals?: None Help from another person taking care of personal grooming?: None Help from another person toileting, which includes using toliet, bedpan, or urinal?: Total Help from another person bathing (including washing, rinsing, drying)?: A Lot Help from another person to put on and taking off  regular upper body clothing?: A Little Help from another person to put on and taking off regular lower body clothing?: A Lot 6 Click Score: 16    End of Session Equipment Utilized During Treatment: Other (comment) Charlaine Dalton)  OT Visit Diagnosis: Unsteadiness on feet (R26.81)   Activity Tolerance Patient tolerated treatment well   Patient Left in bed;with call bell/phone within reach;with bed alarm set   Nurse Communication Mobility status        Time: 5631-4970 OT Time Calculation (min): 23 min  Charges: OT General Charges $OT Visit: 1 Visit OT Treatments $Therapeutic Activity: 23-37 mins  Lodema Hong, [place]  Office [place] 01/23/2022, 2:32 PM

## 2022-01-23 NOTE — TOC CM/SW Note (Addendum)
[facility] called stated [facility] today.  Note has now been entered , [place] requesting it to be co signed by MD. Secure chatted team   Note now co signed. Raquel Sarna at [facility] aware   Discussed patient in [place] rounds with [place]

## 2022-01-23 NOTE — Progress Notes (Addendum)
Subjective:  The patient feels well today. His pain is well controlled. His last bowel movement was 30 minutes ago and it was normal. He ate the majority of his breakfast yesterday, half of a sandwich for lunch, and half of a sandwich with dinner. He feels less confused today. He does note he has been coughing up more clear phlegm today. Denies cough or congestion.   Per nursing note, the patient has also complained of nausea and acid reflux today.   Objective:  Vital signs in last 24 hours: Vitals:   01/22/22 1610 01/22/22 1957 01/23/22 0315 01/23/22 0739  BP: (!) 125/93 124/84 126/82 (!) 144/92  Pulse: 84 80 88 85  Resp: '16 16  17  '$ Temp: 99.2 F (37.3 C) 98.9 F (37.2 C) 99.2 F (37.3 C) 97.9 F (36.6 C)  TempSrc: Oral Oral Oral   SpO2: 100% 100% 100% 100%  Weight:      Height:       Weight change:   Intake/Output Summary (Last 24 hours) at 01/23/2022 1306 Last data filed at 01/23/2022 0916 Gross per 24 hour  Intake 365 ml  Output 500 ml  Net -135 ml   General: Coughing up clear sputum into vomit bag; Appears to be laying comfortably in bed; No acute distress  Cardiovascular:  RRR; no murmurs, rubs, or gallops Pulmonary: Normal respiratory effort; Lungs clear to auscultation anteriorly; no wheezing, rhonchi, or rales   Abdomen: Dressing over groin with left upper corner of wound exposed; no swelling, erythema, or bleeding; dressing soiled with yellow fluid Skin: Warm and dry Neuro: Alert and oriented x4; Answers questions appropriately     Assessment/Plan:  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 Active Problems:   Paraplegia ([place])   Neurogenic bladder   Spasticity   Diabetes mellitus type 2 in nonobese [facility]   Acute delirium   Timothy Davenport is a 71 y.o. with a pertinent PMH of type 2 diabetes and paraplegia, who presented with worsening scrotal infection and admitted for fournier gangrene while on an SGLT-2i. He is status post 2 debridements  and IR guided drain placement of pelvic abscess.   Fournier gangrene s/p debridement x2 and partial wound closure Right seminal vesicle abscess s/p IR drain placement The patient is hemodynamically stable. His pain is well controlled. He is on cefadroxil and flagyl day 9, total antibiotics course day 19. He is medically cleared for discharge, pending [facility]. Family and patient prefer LTAC due to the patient's extensive wound care needs, with multiple wound closure procedures in the future. The patient has also had poor appetite requiring frequent electrolyte repletion, and LTAC is equipped to place NG feeding tube if necessary.  -appreciate urology, general surgery and plastic surgery for their assistance in his care -cefadroxil and flagyl day 9, total antibiotics course day 19, appreciate ID assistance -right TG drain in place, appreciate IR assistance -mirtazapine 15 mg qhs for appetite and sleep -continue wound dressing care -continue dietary supplements  -pain management PRN - CSW - [facility] disposition   Mucus production  The patient reports some clear sputum production over the past couple days. On exam, he is spitting out clear sputum into a vomit bag. He denies shortness of breath, congestion, and cough. His WBC yesterday was normal at 6.7. He has remained afebrile with oxygen saturation of 99-100% on RA since admission. His normal white count, temperature, and O2 saturation makes infection less likely. Nursing reports the patient is  complaining of nausea and acid reflux, which may have been exacerbated by increased PO intake recently while lying in bed. Will order protonix and zofran prn and continue to monitor symptoms.  - Protonix 40 mg  - Zofran 4 mg PRN  Hospital delirium   Patient is A&O x4. He is answering questions appropriately today. Has been sleeping well with the mirtazapine. Will continue Mirtazapine and delirium precautions.  -Mirtazapine  as above -Delirium precautions     Hypophosphatemia Hypokalemia Potassium today is 3.4, phosphorous 2.2 after k phos yesterday. He reports improved nutrition, and he has been placed on calorie count to monitor intake. [facility] is equipped to place NG tube if necessary. Will replete with potassium phosphate and repeat BMP tomorrow.  - IV potassium phosphate 30 mmol - BMP tomorrow    Insulin dependent type 2 diabetes mellitus  The patient reports improved appetite, and some options on the hospital menu are more palatable than others. Will change his diet from carb modified to normal diet to encourage further PO intake. Will also add on 3 units of meal time insulin to offset his post prandial CBG spikes. - SSI ACHS   - Novolog 3 units TID with meals    Chronic medical conditions Bladder spasms - continue dantrolene Depression - continue zoloft HLD - continue simvastatin   LOS: 18 days   [name], Medical Student 01/23/2022, 1:06 PM   Attestation for Student Documentation:  I personally was present and performed or re-performed the history, physical exam and medical decision-making activities of this service and have verified that the service and findings are accurately documented in the student's note.  [name], DO 01/23/2022, 1:59 PM

## 2022-01-23 NOTE — Progress Notes (Signed)
Nutrition Follow-up  DOCUMENTATION CODES:   Not applicable  INTERVENTION:  - Start 48 hr Calorie Count   - Continue Ensure Enlive po BID, each supplement provides 350 kcal and 20 grams of protein.   NUTRITION DIAGNOSIS:   Increased nutrient needs related to wound healing as evidenced by estimated needs (increased metabolic need for healing).  GOAL:   Patient will meet greater than or equal to 90% of their needs  MONITOR:   PO intake, Supplement acceptance  REASON FOR ASSESSMENT:   Consult Wound healing, Poor PO  ASSESSMENT:   Pt is a 71yo M with PMH of T2DM on insulin, paraplegia T8 spinal cord injury, neurogenic bladder, CKD 3, HTN, HLD, prior CVA who presents for worsening left scrotal infection.  Meds include: sliding scale insulin, flagyl, remeron,MVI, miralax. Labs reviewed: K low, phos low.   Pt continues with poor PO intakes. MD consult for calorie count. Per RN, pt only ate a few bites of his eggs and half an Ensure for breakfast and he refused lunch all together. NG tube is in consideration if PO intakes do not improve. RD will continue to gather calorie count results.   Diet Order:   Diet Order             Diet regular Room service appropriate? Yes; Fluid consistency: Thin  Diet effective now                   EDUCATION NEEDS:   Not appropriate for education at this time  Skin:  Skin Assessment: Skin Integrity Issues: Skin Integrity Issues:: Incisions Incisions: groin  Last BM:  01/23/22  Height:   Ht Readings from Last 1 Encounters:  01/19/22 '5\' 11"'$  (1.803 m)    Weight:   Wt Readings from Last 1 Encounters:  01/19/22 78 kg    Ideal Body Weight:     BMI:  Body mass index is 23.98 kg/m.  Estimated Nutritional Needs:   Kcal:  2100-2400kcal  Protein:  95-115g  Fluid:  >1972m  Evola Hollis MGraciela Husbands RD, LDN, CNSC

## 2022-01-24 LAB — BASIC METABOLIC PANEL
Anion gap: 9 (ref 5–15)
BUN: 11 mg/dL (ref 8–23)
CO2: 26 mmol/L (ref 22–32)
Calcium: 9.8 mg/dL (ref 8.9–10.3)
Chloride: 106 mmol/L (ref 98–111)
Creatinine, Ser: 0.85 mg/dL (ref 0.61–1.24)
GFR, Estimated: >60 mL/min (ref >60)
Glucose, Bld: 152 mg/dL — ABNORMAL HIGH (ref 70–99)
Potassium: 3.7 mmol/L (ref 3.5–5.1)
Sodium: 141 mmol/L (ref 135–145)

## 2022-01-24 LAB — GLUCOSE, CAPILLARY
Glucose-Capillary: 201 mg/dL — ABNORMAL HIGH (ref 70–99)
Glucose-Capillary: 118 mg/dL — ABNORMAL HIGH (ref 70–99)
Glucose-Capillary: 135 mg/dL — ABNORMAL HIGH (ref 70–99)

## 2022-01-24 LAB — PHOSPHORUS: Phosphorus: 2.4 mg/dL — ABNORMAL LOW (ref 2.5–4.6)

## 2022-01-24 MED ORDER — DANTROLENE SODIUM 25 MG PO CAPS: 25.0000 mg | ORAL_CAPSULE | Freq: Three times a day (TID) | ORAL | 0 refills | Status: AC

## 2022-01-24 MED ORDER — ADULT MULTIVITAMIN W/MINERALS CH: 1.0000 | ORAL_TABLET | Freq: Every day | ORAL | 0 refills | Status: AC

## 2022-01-24 MED ORDER — METRONIDAZOLE 500 MG PO TABS: 500.0000 mg | ORAL_TABLET | Freq: Two times a day (BID) | ORAL | 0 refills | Status: AC

## 2022-01-24 MED ORDER — GUAIFENESIN ER 600 MG PO TB12: 600.0000 mg | ORAL_TABLET | Freq: Two times a day (BID) | ORAL | 0 refills | Status: AC | PRN

## 2022-01-24 MED ORDER — OXYBUTYNIN CHLORIDE 2.5 MG PO TABS: 7.5000 mg | ORAL_TABLET | Freq: Two times a day (BID) | ORAL | 0 refills | Status: AC

## 2022-01-24 MED ORDER — MIRTAZAPINE 15 MG PO TABS: 15.0000 mg | ORAL_TABLET | Freq: Every day | ORAL | 0 refills | Status: AC

## 2022-01-24 MED ORDER — CEFADROXIL 500 MG PO CAPS: 1000.0000 mg | ORAL_CAPSULE | Freq: Two times a day (BID) | ORAL | 0 refills | Status: AC

## 2022-01-24 MED ORDER — INSULIN ASPART 100 UNIT/ML IJ SOLN: 3.0000 [IU] | Freq: Three times a day (TID) | INTRAMUSCULAR | 11 refills | Status: AC

## 2022-01-24 MED ORDER — POTASSIUM PHOSPHATES 15 MMOLE/5ML IV SOLN
15.0000 mmol | Freq: Once | INTRAVENOUS | Status: AC
  Administered 2022-01-24: 15 mmol via INTRAVENOUS
  Filled 2022-01-24: qty 5

## 2022-01-24 MED ORDER — ENSURE ENLIVE PO LIQD: 237.0000 mL | Freq: Three times a day (TID) | ORAL | 12 refills | Status: AC

## 2022-01-24 MED ORDER — ENSURE ENLIVE PO LIQD
237.0000 mL | Freq: Three times a day (TID) | ORAL | Status: DC
  Administered 2022-01-24: 237 mL via ORAL

## 2022-01-24 NOTE — Progress Notes (Signed)
Calorie Count Note  48 hour calorie count ordered.  Diet: Regular diet, Thin Liquids Supplements: Ensure BID.   (12/6)Breakfast: 195 kcals, 3 gm protein  (12/5) Lunch: refused (per RN)  (12/5) Dinner: (unknown) no ticket to collect Supplements: Ensure BID. 700 kcals, 40 gm protein   Total intake: 895 kcal (42% of minimum estimated needs)  43 protein (45% of minimum estimated needs)  RD discussed with the pt the importance of adequate nutrition and that if he was not able to tolerate enough calories PO that a feeding tube might need to be considered. Pt seemed strongly motivated to drink more Ensure. RD will increase Ensure to 4x daily. Will continue to monitor calorie count.   Nutrition Dx: Increased nutrient needs related to wound healing as evidenced by estimated needs (increased metabolic need for healing).   Goal:  - Tolerate PO and meet >/= 90% of needs  Intervention:  - Continue calorie count.   - Increase Ensure to 4x daily.  Ensure Enlive po BID, each supplement provides 350 kcal and 20 grams of protein.  Thalia Bloodgood, RD, LDN, CNSC.

## 2022-01-24 NOTE — Progress Notes (Signed)
Report given to [facility] at [facility]. RN requested if we can keep IV in.

## 2022-01-24 NOTE — Progress Notes (Addendum)
Physical Therapy Treatment Patient Details Name: Timothy Davenport MRN: [id number] DOB: 06/01/1950 Today's Date: 01/24/2022   History of Present Illness Pt is a 71 yo male admitted for gangrene of the scrotum.  Pt with excisional debridement of B groin and lower abdomen. Third I&D of wound on 11/21.  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 PHM: T8 incomplete paraplegia with neurogenic bladder.    PT Comments    Pt is making very good progress with functional mobility as pt is able to complete bed mobility with mod assist +1 with HOB elevated and STS from EOB with stedy with mod assist +1 from elevated EOB, max assist +1 from low recliner. On this date, pt is able to push to standing with LUE vs pulling to standing with BUE on stedy, as well as tolerate standing ~30 seconds with min assist in stedy. Pt left sitting upright in recliner at end of session. As pt continues to progress, would benefit from STS trials with RW but would require +2 for safety.    Recommendations for follow up therapy are one component of a multi-disciplinary discharge planning process, led by the attending physician.  Recommendations may be updated based on patient status, additional functional criteria and insurance authorization.  Follow Up Recommendations  Skilled nursing-short term rehab (<3 hours/day) Can patient physically be transported by private vehicle: No   Assistance Recommended at Discharge Frequent or constant Supervision/Assistance  Patient can return home with the following Two people to help with walking and/or transfers;Two people to help with bathing/dressing/bathroom;Direct supervision/assist for medications management;Help with stairs or ramp for entrance;Assist for transportation   Equipment Recommendations  None recommended by PT    Recommendations for Other Services       Precautions / Restrictions Precautions Precautions: Fall;Other (comment) Precaution Comments: significant abdominal/groin wound,  JP drain (R low back/hip) Other Brace: bulky dressing to groin area held in place with mesh underwear, JP drain to R low back/buttock area Restrictions Weight Bearing Restrictions: No Other Position/Activity Restrictions: Avoid shearing during transfers and bed mobility due to groin debridement.     Mobility  Bed Mobility Overal bed mobility: Needs Assistance Bed Mobility: Supine to Sit     Supine to sit: Mod assist (assistance to move BLE to EOB but pt able to upright trunk with HOB elevated, uses BUE to fully move BLE to EOB to square up to sitting)          Transfers Overall transfer level: Needs assistance Equipment used: Ambulation equipment used Transfers: Sit to/from Stand Sit to Stand: Mod assist, Max assist           General transfer comment: Pt is able to perform STS from elevated EOB ~4 times with mod assist, progressing from pulling to standing on stedy, to pushing with LUE to assist. Pt transfers STS from recliner with max assist +1 x 1 time. Transfer via Lift Equipment: Stedy  Ambulation/Gait                   Stairs             Wheelchair Mobility    Modified Rankin (Stroke Patients Only)       Balance Overall balance assessment: Needs assistance Sitting-balance support: Bilateral upper extremity supported, No upper extremity supported Sitting balance-Leahy Scale: Poor Sitting balance - Comments: Pt able to maintain static standing sitting EOB with BUE in lap with CGA.  High Level Balance Comments: Pt engages in reaching outside of BOS with 1UE/no UE support with min assist while sitting EOB. Pt with good ability to find COG & return to sitting.            Cognition Arousal/Alertness: Awake/alert Behavior During Therapy: WFL for tasks assessed/performed Overall Cognitive Status: Impaired/Different from baseline Area of Impairment: Awareness, Memory, Safety/judgement, Problem solving                      Memory: Decreased recall of precautions, Decreased short-term memory   Safety/Judgement: Decreased awareness of safety, Decreased awareness of deficits Awareness: Emergent, Anticipatory   General Comments: Pt motivated to get better, decreased recall, unaware of date/time (will request calendar in pt's room)        Exercises      General Comments        Pertinent Vitals/Pain Pain Assessment Pain Assessment: No/denies pain    Home Living                          Prior Function            PT Goals (current goals can now be found in the care plan section) Acute Rehab PT Goals PT Goal Formulation: With patient Time For Goal Achievement: 02/05/22 Potential to Achieve Goals: Good Progress towards PT goals: Progressing toward goals    Frequency    Min 3X/week      PT Plan Current plan remains appropriate    Co-evaluation              AM-PAC PT "6 Clicks" Mobility   Outcome Measure  Help needed turning from your back to your side while in a flat bed without using bedrails?: A Little Help needed moving from lying on your back to sitting on the side of a flat bed without using bedrails?: A Lot Help needed moving to and from a bed to a chair (including a wheelchair)?: Total Help needed standing up from a chair using your arms (e.g., wheelchair or bedside chair)?: Total Help needed to walk in hospital room?: Total Help needed climbing 3-5 steps with a railing? : Total 6 Click Score: 9    End of Session Equipment Utilized During Treatment: Gait belt Activity Tolerance: Patient tolerated treatment well Patient left: in chair;with chair alarm set;with call bell/phone within reach (tray table positioned over chair, BLE elevated, call bell in reach, telesitter in room) Nurse Communication: Mobility status (notified NT of pt sitting in recliner, notified nurse of no dressing around JP drain insertion site) PT Visit Diagnosis: Other  abnormalities of gait and mobility (R26.89);Muscle weakness (generalized) (M62.81)     Time: 8882-8003 PT Time Calculation (min) (ACUTE ONLY): 25 min  Charges:  $Therapeutic Activity: 23-37 mins                     [name], PT, DPT 01/24/22, 9:34 AM   [name] 01/24/2022, 9:33 AM

## 2022-01-24 NOTE — TOC Transition Note (Signed)
Transition of Care [facility]) - CM/SW Discharge Note   Patient Details  Name: Timothy Davenport MRN: [id number] Date of Birth: Sep 13, 1950  Transition of Care [facility]) CM/SW Contact:  Tom-Johnson, Renea Ee, RN Phone Number: 01/24/2022, 2:12 PM   Clinical Narrative:     Patient is scheduled for discharge today to [facility]. [facility] transportation scheduled for 6 pm per Raquel Sarna at [place]. No further TOC needs noted.    Final next level of care: Long Term Acute Care (LTAC) Barriers to Discharge: Barriers Resolved   Patient Goals and CMS Choice Patient states their goals for this hospitalization and ongoing recovery are:: [facility]- LT[facility] CMS Medicare.gov Compare Post Acute Care list provided to:: Patient Choice offered to / list presented to : Patient  Discharge Placement                Patient to be transferred to facility by: PT[facility]      Discharge Plan and Services In-house Referral: Clinical Social Work              DME Arranged: N/A DME Agency: NA       HH Arranged: NA HH Agency: NA        Social Determinants of Health (SDOH) Interventions Transportation Interventions: Inpatient TOC, [facility] ([facility])   Readmission Risk Interventions    04/26/2021    1:38 PM  Readmission Risk Prevention Plan  Post Dischage Appt Complete  Medication Screening Complete  Transportation Screening Complete

## 2022-01-24 NOTE — Discharge Summary (Signed)
Name: JOHNY PITSTICK MRN: [id number] DOB: 04/20/1950 71 y.o. PCP: Darnelle Catalan, PA  Date of Admission: 01/05/2022  8:25 AM Date of Discharge: 01/24/2022 Attending Physician: Lucious Groves, DO  Discharge Diagnosis: 1. 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 Active Problems:   Paraplegia ([place])   Neurogenic bladder   Spasticity   Diabetes mellitus type 2 in nonobese [facility])   Acute delirium   Discharge Medications: Allergies as of 01/24/2022       Reactions   Jardiance [empagliflozin] Other (See Comments)   Fournier Gangrene 2023   Lisinopril Other (See Comments)   Angioedema   Enalapril-hctz [enalapril-hydrochlorothiazide] Rash        Medication List     STOP taking these medications    baclofen 20 MG tablet Commonly known as: LIORESAL   empagliflozin 25 MG Tabs tablet Commonly known as: JARDIANCE   ferrous gluconate 324 MG tablet Commonly known as: FERGON   fluconazole 150 MG tablet Commonly known as: DIFLUCAN   gabapentin 400 MG capsule Commonly known as: NEURONTIN   insulin glargine-yfgn 100 UNIT/ML Pen Commonly known as: SEMGLEE   losartan 50 MG tablet Commonly known as: COZAAR   pioglitazone 30 MG tablet Commonly known as: ACTOS       TAKE these medications    acetaminophen 325 MG tablet Commonly known as: TYLENOL Take 1-2 tablets (325-650 mg total) by mouth every 4 (four) hours as needed for mild pain.   Alogliptin Benzoate 25 MG Tabs Take 12.5 tablets by mouth daily with breakfast.   Ascorbic Acid 500 MG Caps Take 500 mg by mouth every Monday, Wednesday, and Friday.   aspirin EC 81 MG tablet Take 1 tablet (81 mg total) by mouth daily. Swallow whole.   bisacodyl 10 MG suppository Commonly known as: DULCOLAX UNWRAP AND INSERT 1 SUPPOSITORY(10 MG) RECTALLY DAILY AFTER SUPPER   cefadroxil 500 MG capsule Commonly known as: DURICEF Take 2 capsules (1,000 mg total) by mouth 2 (two) times daily. What changed: how  much to take   Cholecalciferol 100 MCG (4000 UT) Tabs Take 4,000 Units by mouth daily.   dantrolene 25 MG capsule Commonly known as: DANTRIUM Take 1 capsule (25 mg total) by mouth 3 (three) times daily. What changed:  medication strength how much to take Another medication with the same name was removed. Continue taking this medication, and follow the directions you see here.   docusate sodium 50 MG capsule Commonly known as: COLACE Take 50 mg by mouth daily.   feeding supplement Liqd Take 237 mLs by mouth 4 (four) times daily -  with meals and at bedtime.   folic acid 1 MG tablet Commonly known as: FOLVITE Take 1 mg by mouth daily.   glucose 4 GM chewable tablet Chew 4 tablets by mouth as needed for low blood sugar.   guaiFENesin 600 MG 12 hr tablet Commonly known as: MUCINEX Take 1 tablet (600 mg total) by mouth 2 (two) times daily as needed for to loosen phlegm.   insulin aspart 100 UNIT/ML injection Commonly known as: novoLOG Inject 3 Units into the skin 3 (three) times daily with meals.   metroNIDAZOLE 500 MG tablet Commonly known as: FLAGYL Take 1 tablet (500 mg total) by mouth every 12 (twelve) hours.   mirtazapine 15 MG tablet Commonly known as: REMERON Take 1 tablet (15 mg total) by mouth at bedtime.   multivitamin with minerals Tabs tablet Take 1 tablet by mouth daily. Start taking on: January 25, 2022   omeprazole 20 MG capsule Commonly known as: PRILOSEC Take 20 mg by mouth daily.   oxyBUTYnin Chloride 2.5 MG Tabs Take 7.5 mg by mouth 2 (two) times daily. What changed:  medication strength how much to take   polyvinyl alcohol 1.4 % ophthalmic solution Commonly known as: LIQUIFILM TEARS Place 1 drop into both eyes 4 (four) times daily.   sertraline 100 MG tablet Commonly known as: ZOLOFT Take 100 mg by mouth daily.   simvastatin 20 MG tablet Commonly known as: ZOCOR Take 20 mg by mouth daily at 6 PM.               Discharge Care  Instructions  (From admission, onward)           Start     Ordered   01/24/22 0000  Discharge wound care:       Comments: Wet to dry dressing changes of lower abdominal/penile/scrotal wound to be done twice daily with NS moistened kerlix covered by abds and mesh underwear. Left testicle and cord structures are exposed - take care to wrap in anatomic position without twisting of cord  Please apply dressings to heels to prevent further pressure injury   01/24/22 1200            Disposition and follow-up:   Mr.Kalvin L Rubens was discharged from [facility] in Stable condition.  At the hospital follow up visit please address:  Sepsis secondary to fournier gangrene Seminal Vesical abscess  The patient is status post 3 debridements, IR drain placement, and plastic surgery partial wound closure. IR recommends drain removal if output is <31m/QD, pending discussion with surgery. He will follow-up with the IR drain clinic outpatient. ID recommends continuing cefadroxil and flagyl for 3 weeks, with follow-up on 12/14 scheduled with Dr. VGale Journey Urology recommends retaining the foley catheter until plastic surgery is done with their interventions, and the patient is mostly healed. Otherwise, the patient will need monthly foley exchanges. Urology will see him outpatient 6 weeks after his next surgery. Plastic surgery would like to reevaluate the patient approximately 2 weeks after his initial wound closure (12/1) to determine date of future skin grafting.   Hypokalemia Hypophosphatemia Please check BMP and replete potassium and phosphorous as needed.   T2DM on insulin  The patient is currently on normal diet with SSI and Novolog 3 u TID with meals. Appetite has been persistently poor during admission, so please monitor PO intake and place NG tube if necessary.    HTN Discontinued home losartan since normotensive during hospital course. Can continue if BP is elevated.   Hospital  delirium  Continue delirium precautions and Mirtazapine qhs. Please monitor the patient's orientation and adjust medication regimen as needed.   2.  Labs / imaging needed at time of follow-up: BMP, phosphorus   3.  Pending labs/ test needing follow-up: None   Follow-up Appointments:  Contact information for follow-up providers     MVira Agar MD. Schedule an appointment as soon as possible for a visit.   Specialty: Urology Contact information: [address], [phone]         TCamillia Herter MD. Schedule an appointment as soon as possible for a visit.   Specialty: Plastic Surgery Contact information: [address] [phone]        VJabier Mutton MD. Go to.   Specialty: Infectious Diseases Why: Follow up  with Dr. Gale Journey at your scheduled appointment date. Contact information: [address] [place] [address] [phone]         Lura Em, PA. Schedule an appointment as soon as possible for a visit.   Specialty: Radiology Contact information: [place] [address] [phone]              Contact information for after-discharge care     Destination     [facility] .   Service: Skilled Nursing Contact information: [place] Hospital Course by problem list:  Sepsis secondary to fournier gangrene Seminal Vesical abscess  The patient presented to the ED on 11/17 due to a worsening left scrotal infection, admitted for fournier gangrene while on SGLT-2i. On exam, he appeared lethargic with an ulceration and drainage of the scrotum. Scrotal US showed complex fluid collection between the testicles concerning for abscess vs. Hematoma. CT showed extensive gas in the scrotum as well as left lower anterior pelvic wall c/f for  necrotizing infection. He also had a leukocytosis of 18 with a mildly decreased blood pressure. He received 3 incision and debridements by urology and surgery on 11/17, 11/19, and 11/21. Wound culture grew pan-sensitive Citrobacter koseri. He was started on IV Zosyn and linezolid for 4 days, narrowed to Ceftriaxone for 3 days, Cefazolin 2 days, Zosyn 4 days, and finally PO Cefadroxil/flagyl at discharge. Repeat CT a/p showed increasing size of right seminal vesicle abscess, and IR placed a drainage catheter on 11/24. Plastic surgery performed partial wound closure on 12/1, and they anticipate more surgical interventions for closure. The patient remained hemodynamically stable during his admission, and his WBC normalized with antibiotics. IR recommends drain removal if output is <4m/QD. He will follow-up with the IR drain clinic outpatient. ID recommends continuing cefadroxil and flagyl for 3 weeks, with follow-up on 12/14 scheduled with Dr. VGale Journeyto determine length of antibiotics. Urology recommends retaining the foley until plastic surgery is done with their interventions and the patient is mostly healed. Otherwise, the patient will need monthly foley exchanges. Urology will see him outpatient 6 weeks after his next surgery. Plastic surgery would like to reevaluate the patient approximately 2 weeks after initial wound closure (12/1) to determine date of future skin grafting.   -Interventional Radiology: Follow-up with the IR drain clinic outpatient Instructions: - Continue TID flushes with 5 cc NS. - Record output Q shift. - Dressing changes QD or PRN if soiled.  - Call IR APP or on call IR MD if difficulty flushing or sudden change in drain output.  - Repeat imaging/possible drain injection once output < 10 mL/QD (excluding flush material). Consideration for drain removal if output is < 10 mL/QD (excluding flush material), pending discussion with the providing surgical service.  -Urology: Will follow up  outpatient in 6 weeks after skin grafting procedure -Plastic surgery: Will need further interventions; will reassess outpatient approximately 2 weeks from initial wound closure -Infectious Disease: appointment on 12/14 with Dr. VGale Journey to determine length of antibiotics  Hypokalemia Hypophosphatemia The patient had a poor appetite during admission, and required frequent repletion of potassium and phosphorous. Ensure protein supplementation was added on BID to improve nutrition. Mirtazapine was also administered qhs to improve appetite and aid with sleep. The patient's appetite continued to fluctuate during  admission, and NG tube was considered and discussed with patient and family. At discharge, RD recommended increasing Ensure to QID. Nutrition issues were discussed with [facility], and they are equipped to place NG if necessary.   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 Stopped pioglitazone, may resume if needed. Semglee 10 units daily on home med list, stopped due to initial poor po intake. He was placed on SSI during admission. As his appetite slowly improved, Novolog 3 u TID with meals was added to improve glycemic control.    History of CVA Memory changes Hospital delirium  During admission, the patient began experiencing some disorientation, attributed to hospital delirium. He was started on Mirtazapine nightly, which improved his sleep and kept him on a normal sleep/wake cycle. The patient and his wife reported chronic memory changes prior to admission, which will likely be addressed in the outpatient setting.   Mucus production Towards the end of admission, the patient began to endorse clear sputum production. During exam, he was observed coughing up clear sputum into a vomit bag. He denied cough, congestion, and shortness of breath. He remained afebrile with SpO2 99-100% on RA since admission. Lungs were clear on  exam. Wife reported the patient has a history of acid reflux causing him to cough up phlegm, for which he takes Tums at home. Nursing also reported some nausea and acid reflux. Protonix, mucinex, and Zofran was added on PRN.   AKI on CKD3b Serum creatinine was 1.86 on admission. The patient's last serum creatinine was 1.4 in October. This was thought to be due to poor p.o. intake. After IV fluids, his creatinine returned to baseline. His creatinine remained at baseline for the rest of his admission.   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 We clarified with the [facility] hospital that the patient is prescribed Dantrolene 25 mg TID, so we continued this dose inpatient. Also continued Oxybutynin 7.5 mg twice daily.    HTN Home losartan was stopped. He remained normotensive during admission. May restart if BP becomes elevated.    Iron deficiency anemia   Home ferrous sulfate was held in setting of ongoing infection requiring further debridement. His hemoglobin remained stable around 8 during admission, and he did not require any iron or blood products.    PTSD Depression The patient continued home sertraline during admission.   HLD The patient continued home simvastatin during admission.    Discharge Subjective:  The patient feels well today. His pain is well controlled. He reports he has been drinking his Ensure shakes, but he refused lunch yesterday because he does not have an appetite and does not feel like eating. We discussed that RD recommended increasing his Ensure protein shakes from 2 to 4 per day, and maintaining his intake will be important to avoid NG tube placement in the future. His last bowel movement was this morning and it was normal. He still has some clear sputum production, but this has improved with PRN medications.   He reports his nurse has not finished redressing his wound and they will be back to finish soon.  Discharge Exam:    BP 110/79 (BP Location: Left Arm)   Pulse (!) 110   Temp 99.3 F (37.4 C) (Oral)   Resp 16   Ht '5\' 11"'$  (1.803 m)   Wt 78 kg   SpO2 100%   BMI 23.98 kg/m  Discharge exam:   General: Appears to be laying comfortably in bed; No acute distress  Cardiovascular:  RRR; no murmurs, rubs, or gallops Pulmonary: Normal respiratory effort; Lungs clear to auscultation anteriorly; no wheezing, rhonchi, or rales   Abdomen: Clean gauze over groin with edges of the wound exposed (nursing staff aware); no swelling, erythema, bleeding, or drainage  Skin: Warm and dry Neuro: Alert and oriented x4; Answers questions appropriately    Pertinent Labs, Studies, and Procedures:      Latest Ref Rng & Units 01/22/2022    3:30 AM 01/20/2022    1:56 AM 01/18/2022    5:14 AM  CBC  WBC 4.0 - 10.5 K/uL 6.7  5.1  7.7   Hemoglobin 13.0 - 17.0 g/dL 8.3  8.1  8.7   Hematocrit 39.0 - 52.0 % 27.2  25.7  28.7   Platelets 150 - 400 K/uL 243  273  332        Latest Ref Rng & Units 01/24/2022    2:55 AM 01/23/2022    3:46 AM 01/22/2022    3:30 AM  CMP  Glucose 70 - 99 mg/dL 152  131  250   BUN 8 - 23 mg/dL '11  7  9   '$ Creatinine 0.61 - 1.24 mg/dL 0.85  0.75  0.87   Sodium 135 - 145 mmol/L 141  140  137   Potassium 3.5 - 5.1 mmol/L 3.7  3.4  3.2   Chloride 98 - 111 mmol/L 106  105  106   CO2 22 - 32 mmol/L '26  26  25   '$ Calcium 8.9 - 10.3 mg/dL 9.8  9.2  8.9     CT HEAD WO CONTRAST (5MM)  Result Date: 01/15/2022 CLINICAL DATA:  Head trauma, fall. EXAM: CT HEAD WITHOUT CONTRAST TECHNIQUE: Contiguous axial images were obtained from the base of the skull through the vertex without intravenous contrast. RADIATION DOSE REDUCTION: This exam was performed according to the departmental dose-optimization program which includes automated exposure control, adjustment of the mA and/or kV according to patient size and/or use of iterative reconstruction technique. COMPARISON:  01/03/2022. FINDINGS: Brain: No acute intracranial  hemorrhage, midline shift or mass effect. No extra-axial fluid collection. Mild diffuse atrophy is noted. Mild periventricular white matter hypodensities bilaterally. No hydrocephalus. Vascular: No hyperdense vessel or unexpected calcification. Skull: Normal. A stable extra-axial calcified lesion is noted over the frontal lobe on the left. Sinuses/Orbits: No acute finding. Other: None. IMPRESSION: Stable head CT with no acute intracranial hemorrhage. Electronically Signed   By: Brett Fairy M.D.   On: 01/15/2022 05:05   CT GUIDED VISCERAL FLUID DRAIN BY PERC CATH  Result Date: 01/12/2022 INDICATION: Pelvic abscess. EXAM: CT-GUIDED RIGHT PELVIC ABSCESS DRAIN PLACEMENT COMPARISON:  CT AP, 01/05/2022. CT pelvis, 01/08/2022 and 01/12/2022. MEDICATIONS: 50 mg Benadryl IV. The patient is currently admitted to the hospital and receiving intravenous antibiotics. The antibiotics were administered within an appropriate time frame prior to the initiation of the procedure. ANESTHESIA/SEDATION: Local anesthetic and single agent sedation was employed during this procedure. A total of fentanyl 100 mcg was administered intravenously. The patient's level of consciousness and vital signs were monitored continuously by radiology nursing throughout the procedure under my direct supervision. CONTRAST:  None COMPLICATIONS: None immediate. PROCEDURE: RADIATION DOSE REDUCTION: This exam was performed according to the departmental dose-optimization program which includes automated exposure control, adjustment of the mA and/or kV according to patient size and/or use of iterative reconstruction technique. Informed written consent was obtained  from the patient and/or patient's representative after a discussion of the risks, benefits and alternatives to treatment. The patient was placed prone on the CT gantry and a pre procedural CT was performed re-demonstrating the known abscess/fluid collection within the RIGHT deep pelvis. The  procedure was planned. A timeout was performed prior to the initiation of the procedure. The RIGHT gluteus was prepped and draped in the usual sterile fashion. The overlying soft tissues were anesthetized with 1% lidocaine with epinephrine. Appropriate trajectory was planned with the use of a 22 gauge spinal needle. An 18 gauge trocar needle was advanced into the abscess/fluid collection and a short Amplatz super stiff wire was coiled within the collection. Appropriate positioning was confirmed with a limited CT scan. The tract was serially dilated allowing placement of a 10 Fr drainage catheter. Appropriate positioning was confirmed with a limited postprocedural CT scan. 25 mL of purulent fluid was aspirated. The tube was connected to a bulb suction and sutured in place. A dressing was placed. The patient tolerated the procedure well without immediate post procedural complication. IMPRESSION: Successful placement of a 10 Fr drainage catheter into a RIGHT pelvic abscess via transgluteal approach, with aspiration of 25 mL of purulent fluid. Samples were sent to the laboratory as requested by the ordering clinical team. Michaelle Birks, MD [facility] Electronically Signed   By: Michaelle Birks M.D.   On: 01/12/2022 15:39   CT PELVIS W CONTRAST  Result Date: 01/12/2022 CLINICAL DATA:  Pelvic abscess. EXAM: CT PELVIS WITH CONTRAST TECHNIQUE: Multidetector CT imaging of the pelvis was performed using the standard protocol following the bolus administration of intravenous contrast. RADIATION DOSE REDUCTION: This exam was performed according to the departmental dose-optimization program which includes automated exposure control, adjustment of the mA and/or kV according to patient size and/or use of iterative reconstruction technique. CONTRAST:  71m OMNIPAQUE IOHEXOL 350 MG/ML SOLN COMPARISON:  01/08/2022 FINDINGS: Urinary Tract: Distal ureters are unremarkable.  Urinary bladder is decompressed by a Foley catheter. Bowel: Normal appearance of the appendix. Normal appearance of large bowel loops. Normal appearance of small bowel loops. Adjacent to the sigmoid colon and RIGHT seminal vesicle, there is a rim enhancing fluid collection measuring 4.1 x 4.0 centimeters, previously 3.6 x 3.7 centimeters at the same level. No new fluid collections. Vascular/Ly Mphatic: There is dense atherosclerotic calcification of the abdominal aorta and its branches. No retroperitoneal or mesenteric adenopathy. Reproductive: Prostate gland unremarkable. Abscess collection adjacent to RIGHT seminal vesicle, described above. Other: Large surgical defect involving the LOWER anterior abdominal wall and perineum, LEFT greater than RIGHT. The appearance is similar to prior study. Musculoskeletal: There is a nonspecific 1.1 centimeter low-attenuation lesion within the posterior RIGHT ilium. IMPRESSION: 1. Slight increase in size of abscess collection adjacent to the sigmoid colon and RIGHT seminal vesicle, now measuring 4.1 x 4.0 centimeters, previously 3.6 x 3.7 centimeters. No new fluid collections. 2. Similar large surgical defect involving the LOWER anterior abdominal wall and perineum. 3. Nonspecific 1.1 centimeter low-attenuation lesion within the posterior RIGHT ilium. 4.  Aortic Atherosclerosis (ICD10-I70.0). Electronically Signed   By: ENolon NationsM.D.   On: 01/12/2022 09:01   CT PELVIS W CONTRAST  Result Date: 01/08/2022 CLINICAL DATA:  Perianal abscess or fistula. EXAM: CT PELVIS WITH CONTRAST TECHNIQUE: Multidetector CT imaging of the pelvis was performed using the standard protocol following the bolus administration of intravenous contrast. RADIATION DOSE REDUCTION: This exam was performed according to the departmental dose-optimization program which  includes automated exposure control, adjustment of the mA and/or kV according to patient size and/or use of iterative reconstruction  technique. CONTRAST:  48m OMNIPAQUE IOHEXOL 350 MG/ML SOLN COMPARISON:  January 05, 2022. FINDINGS: Urinary Tract: Urinary bladder is decompressed secondary to Foley catheter. Visualized ureters are unremarkable. Bowel: There is no definite evidence of bowel obstruction. The appendix is unremarkable. There is no definite evidence of perianal abscess. Vascular/Lymphatic: Atherosclerosis of visualized abdominal aorta is noted. No definite adenopathy is noted. Reproductive: Prostate gland is unremarkable. However, there is continued presence of 3.6 x 3.7 cm fluid collection or abscess in the region of the right seminal vesicle. Other: Large defect is seen involving the anterior pelvic subcutaneous tissues as well as in the perineum consistent with recent surgical resection for infection. Musculoskeletal: No significant osseous abnormality is noted. IMPRESSION: Large surgical defect is seen involving the subcutaneous tissues of the anterior pelvis and perineum. Continued presence of 3.7 x 3.6 cm fluid collection or abscess in the region of the right seminal vesicle. There is no definite evidence of perianal abscess currently. Aortic Atherosclerosis (ICD10-I70.0). Electronically Signed   By: JMarijo ConceptionM.D.   On: 01/08/2022 11:47   CT ABDOMEN PELVIS W CONTRAST  Result Date: 01/05/2022 CLINICAL DATA:  Scrotal pain with purulent drainage. Concern for Fournier's gangrene. EXAM: CT ABDOMEN AND PELVIS WITH CONTRAST TECHNIQUE: Multidetector CT imaging of the abdomen and pelvis was performed using the standard protocol following bolus administration of intravenous contrast. RADIATION DOSE REDUCTION: This exam was performed according to the departmental dose-optimization program which includes automated exposure control, adjustment of the mA and/or kV according to patient size and/or use of iterative reconstruction technique. CONTRAST:  724mOMNIPAQUE IOHEXOL 350 MG/ML SOLN COMPARISON:  Scrotal ultrasound 2 days  prior, CT abdomen/pelvis 04/24/2021 FINDINGS: Lower chest: The lung bases are clear. Coronary artery calcifications and aortic valve calcifications are noted. Hepatobiliary: Numerous hypodense lesions throughout the liver are similar to the prior study, likely benign cysts for which no specific imaging follow-up is required. The gallbladder is unremarkable. There is no biliary ductal dilatation. Pancreas: Unremarkable. Spleen: Unremarkable. Adrenals/Urinary Tract: The adrenals are unremarkable. There is a 7 mm nonobstructing right lower pole stone. Small hypodense lesions in the kidneys most likely reflect benign cysts for which no specific imaging follow-up is required. There is no hydronephrosis or hydroureter. There is some excretion of contrast into the collecting systems on the delayed images. There is a small amount of gas in the bladder. Stomach/Bowel: The stomach is unremarkable. There is no evidence of bowel obstruction. There is no abnormal bowel wall thickening or inflammatory change. There is colonic diverticulosis without evidence of acute diverticulitis. The appendix is normal. Vascular/Lymphatic: There is calcified plaque throughout the nonaneurysmal abdominal aorta. The major branch vessels are patent. The main portal and splenic veins are patent. There is no abdominal or pelvic lymphadenopathy. Reproductive: There is a 3.8 cm x 2.9 cm by 4.4 cm peripherally enhancing collection in the region of the right seminal vesicle superior to the prostate. There may also be a 1.0 cm abscess in the right aspect of the prostate (3-89). There is extensive gas tracking from the scrotum on the left into the soft tissues of the lower anterior pelvic wall. There is coarse calcification in the right scrotum. Other: There is no ascites or free intraperitoneal air. Musculoskeletal: There is no acute osseous abnormality or suspicious osseous lesion. IMPRESSION: 1. 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 2. 3.8 cm x 2.9 cm x 4.4 cm peripherally enhancing collection in the region of the right seminal vesicle superior to the prostate concerning for abscess, and additional possible small 1.0 cm prostatic abscess. 3. Small amount of gas in the bladder may be related to recent instrumentation or spread of infection. 4. 7 mm nonobstructing right lower pole renal stone. Critical Value/emergent results were called by telephone at the time of interpretation on 01/05/2022 at 10:06 am to provider [facility] , who verbally acknowledged these results. Electronically Signed   By: Valetta Mole M.D.   On: 01/05/2022 10:11   DG Chest Portable 1 View  Result Date: 01/05/2022 CLINICAL DATA:  Altered mental status EXAM: PORTABLE CHEST 1 VIEW COMPARISON:  CXR 07/09/19 FINDINGS: No pleural effusion. No pneumothorax. No displaced rib fracture. Normal cardiac and mediastinal contours. Visualized upper abdomen is unremarkable. Degenerative changes of the right AC and glenohumeral joint. IMPRESSION: No focal airspace opacity. Electronically Signed   By: Marin Roberts M.D.   On: 01/05/2022 08:50   CT Head Wo Contrast  Result Date: 01/03/2022 CLINICAL DATA:  Neurological deficit, nausea EXAM: CT HEAD WITHOUT CONTRAST TECHNIQUE: Contiguous axial images were obtained from the base of the skull through the vertex without intravenous contrast. RADIATION DOSE REDUCTION: This exam was performed according to the departmental dose-optimization program which includes automated exposure control, adjustment of the mA and/or kV according to patient size and/or use of iterative reconstruction technique. COMPARISON:  07/22/2019 FINDINGS: Brain: No acute intracranial findings are seen. There are no signs of bleeding within the cranium. Minimal calcifications are seen in basal ganglia. Ventricles are not dilated. Cortical sulci  are prominent. There is 8 mm extra-axial calcification inseparable from the meninges in the left frontoparietal region. There is no adjacent edema or mass effect. This finding appears stable. Vascular: Unremarkable. Skull: No fracture is seen. Sinuses/Orbits: Unremarkable. Other: There is increased amount of CSF in the sella suggesting partial empty sella. IMPRESSION: No acute intracranial findings are seen in noncontrast CT brain. Atrophy. 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 Electronically Signed   By: Elmer Picker M.D.   On: 01/03/2022 20:04   US SCROTUM W/DOPPLER  Result Date: 01/03/2022 CLINICAL DATA:  Scrotal pain. EXAM: SCROTAL ULTRASOUND DOPPLER ULTRASOUND OF THE TESTICLES TECHNIQUE: Complete ultrasound examination of the testicles, epididymis, and other scrotal structures was performed. Color and spectral Doppler ultrasound were also utilized to evaluate blood flow to the testicles. COMPARISON:  None Available. FINDINGS: Right testicle Measurements: 3.3 x 2.0 x 2.8 cm. Symmetric and homogeneous echotexture without focal lesion. Patent arterial and venous blood flow. Left testicle Measurements: 3.2 x 2.2 x 3.0 cm. Symmetric and homogeneous echotexture without focal lesion. Patent arterial and venous blood flow. Right epididymis:  1 cm epididymal cyst. Left epididymis:  Normal in size and appearance. Hydrocele: Moderate-sized bilateral hydroceles. There is a large complex fluid collection in the scrotum between the testicles. This measures a maximum of 5 x 3 cm and contains echogenic floating debris and could be a liquified hematoma or an abscess. Recommend correlation with clinical findings. Varicocele:  None visualized. Pulsed Doppler interrogation of both testes demonstrates normal low resistance  arterial and venous waveforms bilaterally. IMPRESSION: 1. Normal sonographic appearance of both testicles with normal blood flow. 2.  Moderate-sized bilateral hydroceles. 3. Large complex fluid collection in the scrotum between the testicles. This could be a liquified hematoma or an abscess. Recommend correlation with clinical findings. Electronically Signed   By: Marijo Sanes M.D.   On: 01/03/2022 11:11     Discharge Instructions: Discharge Instructions     Call MD for:  difficulty breathing, headache or visual disturbances   Complete by: As directed    Call MD for:  extreme fatigue   Complete by: As directed    Call MD for:  hives   Complete by: As directed    Call MD for:  persistant dizziness or light-headedness   Complete by: As directed    Call MD for:  persistant nausea and vomiting   Complete by: As directed    Call MD for:  redness, tenderness, or signs of infection (pain, swelling, redness, odor or green/yellow discharge around incision site)   Complete by: As directed    Call MD for:  severe uncontrolled pain   Complete by: As directed    Call MD for:  temperature >100.4   Complete by: As directed    Diet - low sodium heart healthy   Complete by: As directed    Discharge wound care:   Complete by: As directed    Wet to dry dressing changes of lower abdominal/penile/scrotal wound to be done twice daily with NS moistened kerlix covered by abds and mesh underwear. Left testicle and cord structures are exposed - take care to wrap in anatomic position without twisting of cord  Please apply dressings to heels to prevent further pressure injury   Increase activity slowly   Complete by: As directed        Signed: [name], DO 01/24/2022, 12:16 PM

## 2022-01-24 NOTE — Progress Notes (Signed)
Pt discharged to [facility] this evening. Picked up by ptar

## 2022-01-24 NOTE — Discharge Instructions (Addendum)
You were hospitalized for scrotal infection requiring debridement and subsequent wound closure. You will continue care at [facility] for further wound care management. See below for your follow-ups with your specialists. Thank you for allowing [place] to be part of your care.   See below for follow-ups:  -Interventional Radiology: Follow-up with the IR drain clinic outpatient Instructions: - Continue TID flushes with 5 cc NS. - Record output Q shift. - Dressing changes QD or PRN if soiled.  - Call IR APP or on call IR MD if difficulty flushing or sudden change in drain output.  - Repeat imaging/possible drain injection once output < 10 mL/QD (excluding flush material). Consideration for drain removal if output is < 10 mL/QD (excluding flush material), pending discussion with the providing surgical service. -Urology: Will follow up outpatient in 6 weeks after skin grafting procedure  -Plastic surgery: Will need further interventions; will reassess approximately 2 weeks from initial wound closure -Infectious Disease: appointment on 12/14 with Dr. Gale Journey

## 2022-01-24 NOTE — Care Management Important Message (Signed)
Important Message  Patient Details  Name: Timothy Davenport MRN: [id number] Date of Birth: 11/21/1950   Medicare Important Message Given:  Yes     Hannah Beat 01/24/2022, 8:12 AM

## 2022-02-01 ENCOUNTER — Encounter: Payer: Self-pay | Admitting: Plastic Surgery

## 2022-02-01 ENCOUNTER — Ambulatory Visit (INDEPENDENT_AMBULATORY_CARE_PROVIDER_SITE_OTHER): Payer: No Typology Code available for payment source | Admitting: Plastic Surgery

## 2022-02-01 ENCOUNTER — Other Ambulatory Visit: Payer: Self-pay

## 2022-02-01 ENCOUNTER — Inpatient Hospital Stay: Payer: BC Managed Care – PPO | Admitting: Internal Medicine

## 2022-02-01 ENCOUNTER — Telehealth: Payer: Self-pay

## 2022-02-01 VITALS — BP 112/80 | HR 95

## 2022-02-01 DIAGNOSIS — S31109D Unspecified open wound of abdominal wall, unspecified quadrant without penetration into peritoneal cavity, subsequent encounter: Secondary | ICD-10-CM

## 2022-02-01 DIAGNOSIS — N493 Fournier gangrene: Secondary | ICD-10-CM

## 2022-02-01 DIAGNOSIS — S31501D Unspecified open wound of unspecified external genital organs, male, subsequent encounter: Secondary | ICD-10-CM

## 2022-02-01 NOTE — Progress Notes (Signed)
Referring Provider Darnelle Catalan, PA [address] [place],  [place] [address]   CC:  Chief Complaint  Patient presents with   Follow-up      Timothy Davenport is an 71 y.o. male.  HPI: Mr. Elkhatib is a 71 year old male who I first consulted on in the hospital.  He had undergone resection for Fournier's gangrene with loss of the penile skin a portion of the scrotum and the lower portion of his abdominal wall.  I took him to the operating room where I primarily closed a portion of the abdominal wall in the scrotum.  I also placed myriad along the penis and the remaining open portion of the abdomen.  He was discharged to a rehab facility where he has been undergoing dressing changes and physical rehab.  He returns today for evaluation of his wounds.  Allergies  Allergen Reactions   Jardiance [Empagliflozin] Other (See Comments)    Fournier Gangrene 2023   Lisinopril Other (See Comments)    Angioedema   Enalapril-Hctz [Enalapril-Hydrochlorothiazide] Rash    Outpatient Encounter Medications as of 02/01/2022  Medication Sig   acetaminophen (TYLENOL) 325 MG tablet Take 1-2 tablets (325-650 mg total) by mouth every 4 (four) hours as needed for mild pain.   Alogliptin Benzoate 25 MG TABS Take 12.5 tablets by mouth daily with breakfast.   Ascorbic Acid 500 MG CAPS Take 500 mg by mouth every Monday, Wednesday, and Friday.   aspirin EC 81 MG tablet Take 1 tablet (81 mg total) by mouth daily. Swallow whole.   bisacodyl (DULCOLAX) 10 MG suppository UNWRAP AND INSERT 1 SUPPOSITORY(10 MG) RECTALLY DAILY AFTER SUPPER   Cholecalciferol 100 MCG (4000 UT) TABS Take 4,000 Units by mouth daily.   dantrolene (DANTRIUM) 25 MG capsule Take 1 capsule (25 mg total) by mouth 3 (three) times daily.   docusate sodium (COLACE) 50 MG capsule Take 50 mg by mouth daily.   feeding supplement (ENSURE ENLIVE / ENSURE PLUS) LIQD Take 237 mLs by mouth 4 (four) times daily -  with meals and at bedtime.   folic acid  (FOLVITE) 1 MG tablet Take 1 mg by mouth daily.   glucose 4 GM chewable tablet Chew 4 tablets by mouth as needed for low blood sugar.   guaiFENesin (MUCINEX) 600 MG 12 hr tablet Take 1 tablet (600 mg total) by mouth 2 (two) times daily as needed for to loosen phlegm.   insulin aspart (NOVOLOG) 100 UNIT/ML injection Inject 3 Units into the skin 3 (three) times daily with meals.   metroNIDAZOLE (FLAGYL) 500 MG tablet Take 1 tablet (500 mg total) by mouth every 12 (twelve) hours.   mirtazapine (REMERON) 15 MG tablet Take 1 tablet (15 mg total) by mouth at bedtime.   Multiple Vitamin (MULTIVITAMIN WITH MINERALS) TABS tablet Take 1 tablet by mouth daily.   omeprazole (PRILOSEC) 20 MG capsule Take 20 mg by mouth daily.   oxybutynin 2.5 MG TABS Take 7.5 mg by mouth 2 (two) times daily.   polyvinyl alcohol (LIQUIFILM TEARS) 1.4 % ophthalmic solution Place 1 drop into both eyes 4 (four) times daily.   sertraline (ZOLOFT) 100 MG tablet Take 100 mg by mouth daily.   simvastatin (ZOCOR) 20 MG tablet Take 20 mg by mouth daily at 6 PM.   cefadroxil (DURICEF) 500 MG capsule Take 2 capsules (1,000 mg total) by mouth 2 (two) times daily. (Patient not taking: Reported on 02/01/2022)   No facility-administered encounter medications on file as of 02/01/2022.  Past Medical History:  Diagnosis Date   Colon polyp    Diabetes mellitus without complication ([place])    Diabetic neuropathy ([place])    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   HTN (hypertension)    Patella fracture    Renal calculi     Past Surgical History:  Procedure Laterality Date   DEBRIDEMENT AND CLOSURE WOUND N/A 01/19/2022   Procedure: Wound debridement, wound closure, possible wound vac, Myriad placement;  Surgeon: Camillia Herter, MD;  Location: [facility];  Service: Plastics;  Laterality: N/A;   I & D EXTREMITY N/A 01/09/2022   Procedure: EXCISIONAL DEBRIDEMENT BILATERAL GROIN AND LOWER ABDOMEN;  Surgeon: Erroll Luna, MD;   Location: [place];  Service: General;  Laterality: N/A;   IRRIGATION AND DEBRIDEMENT ABSCESS N/A 01/05/2022   Procedure: IRRIGATION AND DEBRIDEMENT NECROTIZING  TISSUE RECTAL ABSCESS;  Surgeon: Vira Agar, MD;  Location: [facility];  Service: Urology;  Laterality: N/A;   LUMBAR LAMINECTOMY/DECOMPRESSION MICRODISCECTOMY N/A 07/01/2019   Procedure: LUMBAR LAMINECTOMY/DECOMPRESSION MICRODISCECTOMY 1 LEVEL, THORACIC SIX-SEVEN;  Surgeon: Consuella Lose, MD;  Location: [facility];  Service: Neurosurgery;  Laterality: N/A;  LUMBAR LAMINECTOMY/DECOMPRESSION MICRODISCECTOMY 1 LEVEL, THORACIC SIX-SEVEN    ORIF PATELLA     SCROTAL EXPLORATION N/A 01/07/2022   Procedure: DEBRIDEMENT OF PENIS, SCROTUM, TESTICLES, AND GROIN;  Surgeon: Vira Agar, MD;  Location: [facility];  Service: Urology;  Laterality: N/A;   THORACIC DISCECTOMY N/A 07/03/2019   Procedure: Evacuation of Thoracic Hematoma;  Surgeon: Consuella Lose, MD;  Location: [facility];  Service: Neurosurgery;  Laterality: N/A;   WOUND DEBRIDEMENT N/A 01/07/2022   Procedure: DEBRIDEMENT OF GROIN AND ABDOMINAL WALL;  Surgeon: Dwan Bolt, MD;  Location: [facility] [place];  Service: General;  Laterality: N/A;    Family History  Problem Relation Age of Onset   Hypertension Mother    Hypertension Father     Social History   Social History Narrative   Not on file     Review of Systems General: Denies fevers, chills, weight loss CV: Denies chest pain, shortness of breath, palpitations Open wounds on the penis scrotum and lower abdomen.  Physical Exam    02/01/2022   11:10 AM 01/24/2022    2:26 PM 01/24/2022    8:19 AM  Vitals with BMI  Systolic 701 779 390  Diastolic 80 74 79  Pulse 95 86 110    General:  No acute distress,  Alert and oriented, Non-Toxic, Normal speech and affect Integument: The lower portion of the abdomen is showing good granulation tissue where the myriad was placed.  There is also a healthy granulation base on the penis.  The left  testicle has retracted out of the scrotum and a portion of the primary closure of the scrotum has opened again.  There is no evidence of ongoing infection.   Assessment/Plan Open wounds of the genitalia and lower abdomen: I believe the patient is read as ready as he will be for split-thickness skin graft to his open wounds.  I discussed this procedure with the and with his wife.  They understand that placement of the skin graft on the lower abdomen should be easily doable however placement on the penis and on the testicle will be difficult at best.  There is a significant risk of loss of a large portion of the skin graft due to the difficulty of securing the skin graft to a three-dimensional structure such as the shaft of the penis and the testicle.  We  will begin to make plans for his surgery.  Dressings changed fresh dressings placed  Camillia Herter 02/01/2022, 5:40 PM

## 2022-02-01 NOTE — Telephone Encounter (Signed)
Attempted to call patient regarding missed appointment. Not able to reach him at this time. Left voicemail requesting call back to reschedule.  Leatrice Jewels, RMA

## 2022-02-08 ENCOUNTER — Telehealth: Payer: Self-pay | Admitting: *Deleted

## 2022-02-08 NOTE — Telephone Encounter (Signed)
[place] Request for services auth form submitted via fax

## 2022-02-09 NOTE — Telephone Encounter (Signed)
This message was sent via [employer], a product from [employer] http://www.biscom.com/                    -------Fax Transmission Report-------  To:               Recipient at [phone] Subject:          [place] [id number] Result:           The transmission was successful. Explanation:      All Pages Ok Pages Sent:       26 Connect Time:     21 minutes, 39 seconds Transmit Time:    02/08/2022 17:48 Transfer Rate:    9600 Status Code:      0000 Retry Count:      2 Job Id:           [id number] Unique Id:        [id number] Fax Line:         25 Fax Server:       [employer]

## 2022-02-22 ENCOUNTER — Telehealth: Payer: Self-pay | Admitting: *Deleted

## 2022-02-22 NOTE — Telephone Encounter (Signed)
Orders received from [facility] for PT and SN. Reviewed by Dr. Lovena Le and advised these need to be directed to pt's PCP. I spoke to [place], pt care coordinator, at [facility] and she verbalized understanding and states she will f/u with pt and his care manager. Orders faxed back to [phone] with this information

## 2022-03-06 ENCOUNTER — Inpatient Hospital Stay: Payer: BC Managed Care – PPO | Admitting: Internal Medicine

## 2022-03-08 ENCOUNTER — Telehealth: Payer: Self-pay | Admitting: *Deleted

## 2022-03-08 ENCOUNTER — Ambulatory Visit (INDEPENDENT_AMBULATORY_CARE_PROVIDER_SITE_OTHER): Payer: No Typology Code available for payment source | Admitting: Plastic Surgery

## 2022-03-08 DIAGNOSIS — S31109D Unspecified open wound of abdominal wall, unspecified quadrant without penetration into peritoneal cavity, subsequent encounter: Secondary | ICD-10-CM

## 2022-03-08 DIAGNOSIS — N493 Fournier gangrene: Secondary | ICD-10-CM

## 2022-03-08 DIAGNOSIS — S31501D Unspecified open wound of unspecified external genital organs, male, subsequent encounter: Secondary | ICD-10-CM

## 2022-03-08 NOTE — Progress Notes (Signed)
Milta Deiters returns today for evaluation of his scrotal perineal and penile wounds resulting from debridement after Fournier's gangrene.  Mr. Whilden states that he has been feeling stronger has been eating better and certainly seems more active and engaged today.  Evaluation of his wound shows significant contracture of the wounds with a very clean wound base and no evidence of infection.  I discussed at length with he and his wife the fact that I do not plan any surgical intervention until his healing has gotten to a point where he is no longer closing his own wounds.  Once the secondary intention healing has stopped the remainder of the wound may be skin grafted but I would prefer to wait until there is no evidence of continued wound healing.  Continue with dressing changes as he has been doing and he will return to see me in 3 weeks

## 2022-03-08 NOTE — Telephone Encounter (Signed)
Wound care instructions was given to the patient's wife on (02/01/22)to take back to Physical Rehab.    Copy of wound care instructions was sent to be scanned into the chart.//AB/CMA

## 2022-03-09 ENCOUNTER — Inpatient Hospital Stay: Payer: BC Managed Care – PPO | Admitting: Internal Medicine

## 2022-03-12 ENCOUNTER — Telehealth (HOSPITAL_COMMUNITY): Payer: Self-pay

## 2022-03-12 ENCOUNTER — Telehealth: Payer: Self-pay | Admitting: *Deleted

## 2022-03-12 NOTE — Telephone Encounter (Signed)
Pt's wife called to see if he needed a f/u. She let me know that his drain came out on it's own and has healed. I informed our PA, JO and she stated that if the drain came out then he doesn't need f/u but if he starts to have signs of infection, he will need to go to the ED. She agreed with this plan. AW

## 2022-03-12 NOTE — Telephone Encounter (Signed)
Fax received from [facility] with general care and medication orders for pt. Spoke to [place] to notify these orders need to be directed to pt's PCP as Dr. Lovena Le is only handling the wound care for his perineum. She requested name and contact info for PCP.  Info from epic provided- see below:  Team Member Role and Air traffic controller Info Address Start End Comments  [place], Thornell Mule, [facility] (Physician Assistant) Phone: [phone] Fax: [phone] [place] [address] 04/24/2021 - -   Orders faxed back to [facility] with this information with fax confirmation received.

## 2022-03-13 ENCOUNTER — Encounter: Payer: Self-pay | Admitting: Neurology

## 2022-03-13 ENCOUNTER — Ambulatory Visit (INDEPENDENT_AMBULATORY_CARE_PROVIDER_SITE_OTHER): Payer: BC Managed Care – PPO | Admitting: Neurology

## 2022-03-13 VITALS — BP 86/60 | HR 77

## 2022-03-13 DIAGNOSIS — R4189 Other symptoms and signs involving cognitive functions and awareness: Secondary | ICD-10-CM

## 2022-03-13 DIAGNOSIS — D329 Benign neoplasm of meninges, unspecified: Secondary | ICD-10-CM

## 2022-03-13 NOTE — Progress Notes (Unsigned)
[facility]  PATIENT: Timothy Davenport DOB: 1950-12-10  REQUESTING CLINICIAN: Prosperi, Christian H, * HISTORY FROM: Spouse and chart review  REASON FOR VISIT: Memory complaints    HISTORICAL  CHIEF COMPLAINT:  No chief complaint on file.   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  Patient was admitted in the hospital back in November and was found to have gangrene infection in his testicles.  He was treated in the hospital with surgery and antibiotics and also was noted to have hospital delirium.  Since discharge from the hospital his delirium has subsided but wife has reported the patient is more forgetful.  Sometimes she he is not sure if he took his meds and need additional reminder. He needs assistance with ambulation. He is able to transfer from hospital bed to wheelchair,he has PT, OT and home health aid     TBI:   No past history of TBI Stroke:   no past history of stroke Seizures:    no past history of seizures Sleep:   no history of sleep apnea.  Mood:  Yes depression, On sertraline  Family history of Dementia:  Denies  Functional status: Needs help with some ADLs due to medical condition.  Patient lives with wife . Cooking: patient with wife help  Cleaning: wife  Shopping: wife Bathing: needs help  Toileting: needs help  Driving: no  Bills: Spouse  Medications: Wife helps with his medication  Ever left the stove on by accident?: no Forget how to use items around the house?: no Getting lost going to familiar places?: N/A  Forgetting loved ones names?: denies  Word finding difficulty? Denies  Sleep: good    OTHER MEDICAL CONDITIONS: Diabetes Mellitus, Hypertension, Depression, recent gangrene infection,     REVIEW OF SYSTEMS: Full 14 system review of systems performed and negative with exception of: As noted in the HPI   ALLERGIES: Allergies  Allergen Reactions   Jardiance [Empagliflozin] Other (See Comments)    Fournier Gangrene 2023   Lisinopril Other (See Comments)    Angioedema   Enalapril-Hctz [Enalapril-Hydrochlorothiazide] Rash    HOME MEDICATIONS: Outpatient Medications Prior to Visit  Medication Sig Dispense Refill   acetaminophen (TYLENOL) 325 MG tablet Take 1-2 tablets (325-650 mg total) by mouth every 4 (four) hours as needed for mild pain.     Alogliptin Benzoate 25 MG TABS Take 12.5 tablets by mouth daily with breakfast.     Alogliptin Benzoate 25 MG TABS Take 0.5 tablets by mouth daily.     Ascorbic Acid 500 MG CAPS Take 500 mg by mouth every Monday, Wednesday, and Friday.     aspirin EC 81 MG tablet Take 1 tablet (81 mg total) by mouth daily. Swallow whole. 30 tablet 11   baclofen (LIORESAL) 20 MG tablet TAKE ONE TABLET BY MOUTH TWICE A DAY FOR MUSCLE SPASTICITY. DOSE DECREASE ON 02/26/22.     bisacodyl (DULCOLAX) 10 MG suppository UNWRAP AND INSERT 1 SUPPOSITORY(10 MG) RECTALLY DAILY AFTER SUPPER 30 suppository 0   Cholecalciferol 100 MCG (4000 UT) TABS Take 4,000 Units by mouth daily.     dantrolene (DANTRIUM) 25 MG capsule Take 1 capsule (25 mg total) by mouth 3 (three) times daily. 90 capsule 0   docusate sodium (COLACE) 50 MG capsule Take 50 mg by  mouth daily.     feeding supplement (ENSURE ENLIVE / ENSURE PLUS) LIQD Take 237 mLs by mouth 4 (four) times daily -  with meals and at bedtime. 237 mL 12   ferrous gluconate (FERGON) 324 MG tablet TAKE ONE TABLET BY MOUTH MONDAY, WEDNESDAY AND FRIDAY FOR ANEMIA - TAKE WITH ASCORBIC ACID AS DIRECTED     folic acid (FOLVITE) 1 MG tablet Take 1 mg by mouth daily.     gabapentin (NEURONTIN) 400 MG capsule TAKE ONE CAPSULE BY MOUTH AT BEDTIME DOSE DECREASE 02/26/22     glucose 4 GM chewable tablet Chew 4 tablets by mouth as  needed for low blood sugar.     insulin aspart (NOVOLOG) 100 UNIT/ML injection Inject 3 Units into the skin 3 (three) times daily with meals. 10 mL 11   losartan (COZAAR) 100 MG tablet TAKE ONE-HALF TABLET BY MOUTH IN THE MORNING FOR BLOOD PRESSURE. DECREASED DOSE ON 02/26/22.     mirtazapine (REMERON) 15 MG tablet Take 1 tablet (15 mg total) by mouth at bedtime. 30 tablet 0   Multiple Vitamin (MULTIVITAMIN WITH MINERALS) TABS tablet Take 1 tablet by mouth daily. 30 tablet 0   omeprazole (PRILOSEC) 20 MG capsule Take 20 mg by mouth daily.     oxybutynin 2.5 MG TABS Take 7.5 mg by mouth 2 (two) times daily. 60 tablet 0   polyvinyl alcohol (LIQUIFILM TEARS) 1.4 % ophthalmic solution Place 1 drop into both eyes 4 (four) times daily. 15 mL 0   sertraline (ZOLOFT) 100 MG tablet Take 100 mg by mouth daily.     simvastatin (ZOCOR) 20 MG tablet Take 20 mg by mouth daily at 6 PM.     No facility-administered medications prior to visit.    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   HTN (hypertension)    Patella fracture    Renal calculi     PAST SURGICAL HISTORY: Past Surgical History:  Procedure Laterality Date   DEBRIDEMENT AND CLOSURE WOUND N/A 01/19/2022   Procedure: Wound debridement, wound closure, possible wound vac, Myriad placement;  Surgeon: Camillia Herter, MD;  Location: [place];  Service: Plastics;  Laterality: N/A;   I & D EXTREMITY N/A 01/09/2022   Procedure: EXCISIONAL DEBRIDEMENT BILATERAL GROIN AND LOWER ABDOMEN;  Surgeon: Erroll Luna, MD;  Location: [place];  Service: General;  Laterality: N/A;   IRRIGATION AND DEBRIDEMENT ABSCESS N/A 01/05/2022   Procedure: IRRIGATION AND DEBRIDEMENT NECROTIZING  TISSUE RECTAL ABSCESS;  Surgeon: Vira Agar, MD;  Location: [place];  Service: Urology;  Laterality: N/A;   LUMBAR  LAMINECTOMY/DECOMPRESSION MICRODISCECTOMY N/A 07/01/2019   Procedure: LUMBAR LAMINECTOMY/DECOMPRESSION MICRODISCECTOMY 1 LEVEL, THORACIC SIX-SEVEN;  Surgeon: Consuella Lose, MD;  Location: [place];  Service: Neurosurgery;  Laterality: N/A;  LUMBAR LAMINECTOMY/DECOMPRESSION MICRODISCECTOMY 1 LEVEL, THORACIC SIX-SEVEN    ORIF PATELLA     SCROTAL EXPLORATION N/A 01/07/2022   Procedure: DEBRIDEMENT OF PENIS, SCROTUM, TESTICLES, AND GROIN;  Surgeon: Vira Agar, MD;  Location: [place];  Service: Urology;  Laterality: N/A;   THORACIC DISCECTOMY N/A 07/03/2019   Procedure: Evacuation of Thoracic Hematoma;  Surgeon: Consuella Lose, MD;  Location: [place];  Service: Neurosurgery;  Laterality: N/A;   WOUND DEBRIDEMENT N/A 01/07/2022   Procedure: DEBRIDEMENT OF GROIN AND ABDOMINAL WALL;  Surgeon: Dwan Bolt, MD;  Location[facility];  Service: General;  Laterality: N/A;    FAMILY HISTORY: Family History  Problem Relation Age of Onset   Hypertension Mother    Hypertension Father     SOCIAL HISTORY: Social History   Socioeconomic History   Marital status: Married    Spouse name: Not on file   Number of children: Not on file   Years of education: Not on file   Highest education level: Not on file  Occupational History   Not on file  Tobacco Use   Smoking status: Never   Smokeless tobacco: Never  Vaping Use   Vaping Use: Never used  Substance and Sexual Activity   Alcohol use: Yes    Comment: 2-3 drinks week   Drug use: Yes    Types: Marijuana    Comment: occasionally   Sexual activity: Not Currently  Other Topics Concern   Not on file  Social History Narrative   Not on file   Social Determinants of Health   Financial Resource Strain: Not on file  Food Insecurity: No Food Insecurity (01/13/2022)   Hunger Vital Sign    Worried About Running Out of Food in the Last Year: Never true    Ran Out of Food in the Last Year: Never true  Transportation Needs: No Transportation Needs  (01/13/2022)   PRAPARE - Hydrologist (Medical): No    Lack of Transportation (Non-Medical): No  Physical Activity: Not on file  Stress: Not on file  Social Connections: Not on file  Intimate Partner Violence: Not At Risk (01/13/2022)   Humiliation, Afraid, Rape, and Kick questionnaire    Fear of Current or Ex-Partner: No    Emotionally Abused: No    Physically Abused: No    Sexually Abused: No    PHYSICAL EXAM  GENERAL EXAM/CONSTITUTIONAL: Vitals:  Vitals:   03/13/22 1419  BP: (!) 86/60  Pulse: 77   There is no height or weight on file to calculate BMI. Wt Readings from Last 3 Encounters:  01/19/22 171 lb 15.3 oz (78 kg)  01/03/22 180 lb (81.6 kg)  04/24/21 153 lb (69.4 kg)   Patient is in no distress; well developed, nourished and groomed; laying on the gurney   EYES: Visual fields full to confrontation, Extraocular movements intacts,   MUSCULOSKELETAL: Gait, strength, tone, movements noted in Neurologic exam below  NEUROLOGIC: MENTAL STATUS:      No data to display            03/13/2022    2:22 PM  Montreal Cognitive Assessment   Visuospatial/ Executive (0/5) 4  Naming (0/3) 3  Attention: Read list of digits (0/2) 2  Attention: Read list of letters (0/1) 1  Attention: Serial 7 subtraction starting at 100 (0/3) 3  Language: Repeat phrase (0/2) 1  Language : Fluency (0/1) 0  Abstraction (0/2) 2  Delayed Recall (0/5) 2  Orientation (0/6) 2  Total 20     CRANIAL NERVE:  2nd, 3rd, 4th, 6th- visual fields full to confrontation, extraocular muscles intact, no nystagmus 5th - facial sensation symmetric 7th - facial strength symmetric 8th - hearing intact 9th - palate elevates symmetrically, uvula midline 11th - shoulder shrug symmetric 12th - tongue protrusion midline  SENSORY:  normal and symmetric to light touch  GAIT/STATION:  Deferred     DIAGNOSTIC DATA (LABS, IMAGING, TESTING) - I reviewed patient records,  labs, notes, testing and imaging myself where available.  Lab Results  Component  Value Date   WBC 6.7 01/22/2022   HGB 8.3 (L) 01/22/2022   HCT 27.2 (L) 01/22/2022   MCV 82.9 01/22/2022   PLT 243 01/22/2022      Component Value Date/Time   NA 141 01/24/2022 0255   NA 137 03/08/2021 1137   K 3.7 01/24/2022 0255   CL 106 01/24/2022 0255   CO2 26 01/24/2022 0255   GLUCOSE 152 (H) 01/24/2022 0255   BUN 11 01/24/2022 0255   BUN 17 03/08/2021 1137   CREATININE 0.85 01/24/2022 0255   CALCIUM 9.8 01/24/2022 0255   PROT 7.7 01/05/2022 0900   PROT 7.9 03/08/2021 1137   ALBUMIN 2.6 (L) 01/05/2022 0900   ALBUMIN 3.9 03/08/2021 1137   AST 16 01/05/2022 0900   ALT 12 01/05/2022 0900   ALKPHOS 71 01/05/2022 0900   BILITOT 0.6 01/05/2022 0900   BILITOT 0.2 03/08/2021 1137   GFRNONAA >60 01/24/2022 0255   GFRAA >60 07/20/2019 0550   No results found for: "CHOL", "HDL", "LDLCALC", "LDLDIRECT", "TRIG", "CHOLHDL" Lab Results  Component Value Date   HGBA1C 6.0 (H) 06/30/2019   No results found for: "VITAMINB12" No results found for: "TSH"  CT Head 01/03/2022 No acute intracranial findings are seen in noncontrast CT brain. Atrophy. There is 8 mm coarse extra-axial calcification in the left frontoparietal region close to midline with no interval change suggesting dystrophic dural calcification or small calcified meningioma with no significant interval change    ASSESSMENT AND PLAN  72 y.o. year old male with diabetes mellitus, diabetic neuropathy, previous spinal cord injury 3 years ago, recent gangrene infection of his testicle in December, cognitive impairment who is presenting for memory deficit.  At baseline patient does have memory impairment but due to the infection and antibiotics he did have hospital delirium and worsening of his memory.  Patient is still convalescent from his infection, I think his cognitive impairment might be worsened by his current medical condition.  Today, he  scored a 20/30 on the [place]. I did advise wife to follow-up in 6 months at that time we will repeat his assessment, and if needed we will start him on medication.  She voices understanding. He was also noted to have a calcified meningioma on his CT.  Will plan to repeat another CT head in a year or 2.   1. Cognitive impairment   2. Meningioma [facility])      Patient Instructions  Continue current medications  Return in 6 months, at that time we will repeat test, and if any abnormalities, then will consider medications  For his meningioma, will plan to repeat CT scan in a year or two.  There are well-accepted and sensible ways to reduce risk for Alzheimers disease and other degenerative brain disorders .  Exercise Daily Walk A daily 20 minute walk should be part of your routine. Disease related apathy can be a significant roadblock to exercise and the only way to overcome this is to make it a daily routine and perhaps have a reward at the end (something your loved one loves to eat or drink perhaps) or a personal trainer coming to the home can also be very useful. Most importantly, the patient is much more likely to exercise if the caregiver / spouse does it with him/her. In general a structured, repetitive schedule is best.  General Health: Any diseases which effect your body will effect your brain such as a pneumonia, urinary infection, blood clot, heart attack or stroke. Keep contact with your  primary care doctor for regular follow ups.  Sleep. A good nights sleep is healthy for the brain. Seven hours is recommended. If you have insomnia or poor sleep habits we can give you some instructions. If you have sleep apnea wear your mask.  Diet: Eating a heart healthy diet is also a good idea; fish and poultry instead of red meat, nuts (mostly non-peanuts), vegetables, fruits, olive oil or canola oil (instead of butter), minimal salt (use other spices to flavor foods), whole grain rice, bread, cereal and  pasta and wine in moderation.Research is now showing that the MIND diet, which is a combination of The Mediterranean diet and the DASH diet, is beneficial for cognitive processing and longevity. Information about this diet can be found in The MIND Diet, a book by Doyne Keel, MS, RDN, and online at [employer]bookDistributors.si  Finances, Power of Attorney and Advance Directives: You should consider putting legal safeguards in place with regard to financial and medical decision making. While the spouse always has power of attorney for medical and financial issues in the absence of any form, you should consider what you want in case the spouse / caregiver is no longer around or capable of making decisions.  No orders of the defined types were placed in this encounter.   No orders of the defined types were placed in this encounter.   Return in about 6 months (around 09/11/2022).  I have spent a total of 50 minutes dedicated to this patient today, preparing to see patient, performing a medically appropriate examination and evaluation, ordering tests and/or medications and procedures, and counseling and educating the patient/family/caregiver; independently interpreting result and communicating results to the family/patient/caregiver; and documenting clinical information in the electronic medical record.   Alric Ran, MD 03/15/2022, 1:20 PM  [facility] [address] [place], [place] [address] [phone]

## 2022-03-14 ENCOUNTER — Other Ambulatory Visit: Payer: Self-pay

## 2022-03-14 ENCOUNTER — Encounter: Payer: Self-pay | Admitting: Internal Medicine

## 2022-03-14 ENCOUNTER — Ambulatory Visit (INDEPENDENT_AMBULATORY_CARE_PROVIDER_SITE_OTHER): Payer: No Typology Code available for payment source | Admitting: Internal Medicine

## 2022-03-14 VITALS — BP 85/61 | HR 79 | Temp 97.6°F

## 2022-03-14 DIAGNOSIS — N493 Fournier gangrene: Secondary | ICD-10-CM | POA: Diagnosis not present

## 2022-03-14 NOTE — Assessment & Plan Note (Signed)
He has done well post management and getting appropriate wound care and management per plastic surgery.  At this point, no further antibiotics indicated. He can follow up here as needed.  I have personally spent 40 minutes involved in face-to-face and non-face-to-face activities for this patient on the day of the visit. Professional time spent includes the following activities: Preparing to see the patient (review of tests), Obtaining and/or reviewing separately obtained history (admission/discharge record), Performing a medically appropriate examination and/or evaluation , Ordering medications/tests/procedures, referring and communicating with other health care professionals, Documenting clinical information in the EMR, Independently interpreting results (not separately reported), Communicating results to the patient/family/caregiver, Counseling and educating the patient/family/caregiver and Care coordination (not separately reported).

## 2022-03-14 NOTE — Progress Notes (Signed)
   Subjective:    Patient ID: Timothy Davenport, male    DOB: [birth year], 72 y.o.   MRN: [id number]  HPI Timothy Davenport is here for hsfu He developed acute onset Fornier's gangrene in November and required extensive operative management and antibiotic treatment.  He was discharged in December with a drain in place and oral antibiotics with a culture growth of Citrobacter.  He completed antibiotics about 1 month ago and has remained stable.  He is followed by plastic surgery with plans at some point for further management. He feels well now.    Review of Systems  Constitutional:  Negative for fatigue.  Gastrointestinal:  Negative for diarrhea and nausea.  Skin:  Negative for rash.       Objective:   Physical Exam Eyes:     General: No scleral icterus. Pulmonary:     Effort: Pulmonary effort is normal.  Skin:    Comments: Wound area with no pus, packed with some serosanguinous drainage.  Good granulation tissue.   Neurological:     Mental Status: He is alert.   SH: no tobacco        Assessment & Plan:

## 2022-03-15 NOTE — Patient Instructions (Addendum)
Continue current medications  Return in 6 months, at that time we will repeat test, and if any abnormalities, then will consider medications  For his meningioma, will plan to repeat CT scan in a year or two.  There are well-accepted and sensible ways to reduce risk for Alzheimers disease and other degenerative brain disorders .  Exercise Daily Walk A daily 20 minute walk should be part of your routine. Disease related apathy can be a significant roadblock to exercise and the only way to overcome this is to make it a daily routine and perhaps have a reward at the end (something your loved one loves to eat or drink perhaps) or a personal trainer coming to the home can also be very useful. Most importantly, the patient is much more likely to exercise if the caregiver / spouse does it with him/her. In general a structured, repetitive schedule is best.  General Health: Any diseases which effect your body will effect your brain such as a pneumonia, urinary infection, blood clot, heart attack or stroke. Keep contact with your primary care doctor for regular follow ups.  Sleep. A good nights sleep is healthy for the brain. Seven hours is recommended. If you have insomnia or poor sleep habits we can give you some instructions. If you have sleep apnea wear your mask.  Diet: Eating a heart healthy diet is also a good idea; fish and poultry instead of red meat, nuts (mostly non-peanuts), vegetables, fruits, olive oil or canola oil (instead of butter), minimal salt (use other spices to flavor foods), whole grain rice, bread, cereal and pasta and wine in moderation.Research is now showing that the MIND diet, which is a combination of The Mediterranean diet and the DASH diet, is beneficial for cognitive processing and longevity. Information about this diet can be found in The MIND Diet, a book by Doyne Keel, MS, RDN, and online at [employer]bookDist[employer].si  Finances, Power of Attorney and  Advance Directives: You should consider putting legal safeguards in place with regard to financial and medical decision making. While the spouse always has power of attorney for medical and financial issues in the absence of any form, you should consider what you want in case the spouse / caregiver is no longer around or capable of making decisions.

## 2022-03-28 ENCOUNTER — Encounter: Payer: No Typology Code available for payment source | Admitting: Plastic Surgery

## 2022-04-19 ENCOUNTER — Encounter: Payer: No Typology Code available for payment source | Admitting: Plastic Surgery

## 2022-05-03 ENCOUNTER — Ambulatory Visit: Payer: No Typology Code available for payment source | Admitting: Student

## 2022-05-03 ENCOUNTER — Encounter: Payer: No Typology Code available for payment source | Admitting: Plastic Surgery

## 2022-05-03 VITALS — BP 126/82 | HR 65

## 2022-05-03 DIAGNOSIS — N493 Fournier gangrene: Secondary | ICD-10-CM

## 2022-05-03 DIAGNOSIS — S31501D Unspecified open wound of unspecified external genital organs, male, subsequent encounter: Secondary | ICD-10-CM | POA: Diagnosis not present

## 2022-05-03 NOTE — Progress Notes (Signed)
   Referring Provider Timothy Catalan, PA [address] [place],  [place] [address]   CC:  Chief Complaint  Patient presents with   Post-op Follow-up      Timothy Davenport is an 72 y.o. male.  HPI:  Patient is a 72 year old male with history of Fournier's gangrene.  He underwent wound washout, closure with advancement flaps, placement of myriad matrix with Dr. Lovena Le on 01/19/2022.  Patient presents to the clinic today for postoperative follow-up.  Patient was most recently seen in the clinic on 03/08/2022.  At this visit, patient reported that he had been feeling stronger and eating better.  On exam, wound shows significant contracture with a clean wound base and no signs of infection.  It was discussed with the patient and his wife that there was no plan for any further surgical intervention until his healing has gone to a point where he is no longer closing his own wounds.  It was discussed with the patient that once the secondary intention healing has stopped, the remainder of the wound may be skin grafted, but this would be preferably done when there is no evidence of continued wound healing.  Plan is patient to continue dressing changes and return in 3 weeks.  Today, patient presents with his wife.  He denies any new issues or concerns.  He reports he has been doing well with the dressing changes.  Patient reports that he currently still has a Foley catheter in.  He states that he could possibly want to switch to in and out caths.  Review of Systems General: No fevers, chills  Physical Exam    05/03/2022    1:59 PM 03/14/2022   11:28 AM 03/13/2022    2:19 PM  Vitals with BMI  Systolic 811 85 86  Diastolic 82 61 60  Pulse 65 79 77    General:  No acute distress,  Alert and oriented, Non-Toxic, Normal speech and affect Perineum and penis: Overall there is good epithelial tissue noted throughout the wound.  It is hypopigmented.  There is no surrounding erythema or drainage.  There is a  small area of granulation tissue noted to the superior aspect of the wound.  It appears to be healthy and clean.  Incisions just inferior to the abdomen are intact with Prolene sutures.  They appear to have healed well.  There are no signs of infection on exam.  Prolene sutures were removed bilaterally.  Patient tolerated well.  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  Will plan for patient to continue wet-to-dry dressings on the small area where the granulation is still present.  He may also reinforce the area with an ABD pad.  Patient expressed understanding.  Recommended follow-up with urologist to see if patient would like to keep Foley catheter in or transition to in and out caths.  Patient and patient's wife stated that they have an appointment with urology coming up.  Will plan to have the patient follow back up in 2 months.  Pictures were obtained of the patient and placed in the chart with the patient's or guardian's permission.  I instructed the patient to call back for has any questions or concerns in the meantime.  Dr. Lovena Le also examined the patient and agrees with the plan.  Clance Boll 05/03/2022, 3:09 PM

## 2022-06-18 ENCOUNTER — Telehealth: Payer: Self-pay | Admitting: Plastic Surgery

## 2022-06-18 NOTE — Telephone Encounter (Signed)
Spouse called to inform provider that the nurse has cleared him and [facility] has stopped the wound care, he no longer needs it. Spouse will have a very short time to request transportation due to 3 day notice required. Is it ok to cancel or does he really need to be seen ?

## 2022-06-18 NOTE — Telephone Encounter (Signed)
Called pt to see if we needed to change the appt due to short notice for transportation. Spouse says she able to confirm with transportation services for the appt on Wednesday, 06/20/22, and they will be here as scheduled.

## 2022-06-20 ENCOUNTER — Ambulatory Visit (INDEPENDENT_AMBULATORY_CARE_PROVIDER_SITE_OTHER): Payer: No Typology Code available for payment source | Admitting: Student

## 2022-06-20 VITALS — BP 132/85 | HR 67

## 2022-06-20 DIAGNOSIS — N493 Fournier gangrene: Secondary | ICD-10-CM

## 2022-06-20 NOTE — Progress Notes (Signed)
   Referring Provider Tessie Eke, PA [address] [place],  [place] [address]   CC:  Chief Complaint  Patient presents with   Follow-up      Timothy Davenport is an 72 y.o. male.  HPI: Patient is a 72 year old male with history of Fournier's gangrene.  He underwent wound washout, closure with advancement flaps, placement of myriad matrix with Dr. Ladona Ridgel on 01/19/2022.  Patient presents to the clinic today for postoperative follow-up.  Patient was last seen in the clinic on 05/03/2022.  At this visit, patient reported he was doing well.  On exam, there is good epithelial tissue noted throughout the wound.  It did appear slightly hypopigmented.  There was a small area of granulation tissue noted to the superior aspect of the wound.  It appeared to be clean and healthy.  Plan was for patient to continue with wet-to-dry dressings and reinforce area with an ABD pad.  Plan was for patient to follow back up in 2 months.  Today, patient presents with his wife.  He denies any issues or concerns with the wound.  He reports that he would like to transition from a Foley catheter to in and out caths.  Review of Systems General: Denies any issues or concerns.  Physical Exam    06/20/2022    3:15 PM 05/03/2022    1:59 PM 03/14/2022   11:28 AM  Vitals with BMI  Systolic 132 126 85  Diastolic 85 82 61  Pulse 67 65 79    General:  No acute distress,  Alert and oriented, Non-Toxic, Normal speech and affect Chaperone present on exam.  On exam, patient is sitting upright in no acute distress.  Wound to the the penis and pubic area has completely epithelialized.  Incisions to the lateral suprapubic areas have healed nicely.  There is a little bit of firmness just superior of the base of the penis that is consistent with some scarring.    Assessment/Plan  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   Patient has healed well and his wounds have epithelialized nicely.  I recommended that he massage the area just  superior of his penis to help soften the scar tissue.  Patient expressed understanding.  I recommended that the patient follow-up with his urologist to talk about his Foley catheter.  Patient expressed understanding.  Patient to follow up as needed.  I instructed the patient the patient's wife to call if they have any questions or concerns about anything.  Pictures were obtained of the patient and placed in the chart with the patient's or guardian's permission.   Laurena Spies 06/20/2022, 4:00 PM

## 2022-09-05 ENCOUNTER — Other Ambulatory Visit (HOSPITAL_COMMUNITY): Payer: Self-pay

## 2022-09-12 ENCOUNTER — Encounter: Payer: Self-pay | Admitting: Physical Medicine and Rehabilitation

## 2022-09-12 ENCOUNTER — Encounter
Payer: BC Managed Care – PPO | Attending: Physical Medicine and Rehabilitation | Admitting: Physical Medicine and Rehabilitation

## 2022-09-12 VITALS — BP 137/88 | HR 55

## 2022-09-12 DIAGNOSIS — N319 Neuromuscular dysfunction of bladder, unspecified: Secondary | ICD-10-CM | POA: Diagnosis present

## 2022-09-12 DIAGNOSIS — Z794 Long term (current) use of insulin: Secondary | ICD-10-CM

## 2022-09-12 DIAGNOSIS — K592 Neurogenic bowel, not elsewhere classified: Secondary | ICD-10-CM

## 2022-09-12 DIAGNOSIS — E119 Type 2 diabetes mellitus without complications: Secondary | ICD-10-CM

## 2022-09-12 DIAGNOSIS — Z993 Dependence on wheelchair: Secondary | ICD-10-CM

## 2022-09-12 DIAGNOSIS — R252 Cramp and spasm: Secondary | ICD-10-CM

## 2022-09-12 DIAGNOSIS — G8220 Paraplegia, unspecified: Secondary | ICD-10-CM

## 2022-09-12 NOTE — Patient Instructions (Signed)
  Awake, alert, appropriate, in manual w/c- accompanied by wife, NAD  MS:  LE's RLE- HF HF 2-/5; KE 4-/5; and DF 2+/5; and PF 4-/5 LLE- HF 2+/5; KE 4+/5; and DF 4/5 and PF 5-/5  Neuro:- MAS of 3-4 in R knee, 3-4 in R hip esp abduction and MAS of 3 in R ankle- No clonus, but really tight LLE- MAS of 1+ all over.         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  Also has hepatic lesions- on liver- dx'd several years ago.    Suggest SCI support group again- - next appt- would be 8/29- at 6pm last Thursday of the month- - at [facility]   2. [facility]- [facility]- can call PCP and have them do labs at end of August- need to do by then. Needs ot be odne since on Dantrolene for spasticity   3. No additional pain on Gabapentin 400 mg nightly-   4. [facility]    5. Con't Dantrolene 25 mg 3x/day- and Baclofen 20 mg BID- and spasticity doing a lot better per pt, but his tone/spasticity/tightness is much tighter- suggest, if PCP ok with Liver labs, to increase dantrolene to at least 50 mg 3x/day- or can take 05 mg in Am and 100 mg at bedtime- would also take Baclofen 20 mg 3x/day- vs 2x/day- for tone.  I wrote how I suggest meds be prescribed for spasticity- if you cannot increase, due to liver function elevation, then call me.    6. Can take a screen shot of labs and send them to me via my chart- has to be in normal limits and can message me via my chart messaging.   7. F/U 6 months- double appt- SCI

## 2022-09-12 NOTE — Progress Notes (Signed)
Subjective:    Patient ID: Timothy Davenport, male    DOB: 04/03/1950, 72 y.o.   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  Also has hepatic lesions- on liver- dx'd several years ago.    Was in hospital for 1 month in November/December 2023. Had a ruptured testicle- had to be removed.  Due ot infection/gangrene.   Was in hospital recuperating.  Now healed completely.    Walking still- 30-40 ft at a time- with RW_ tries to walk every other day.    Still taking Dantrolene for spasticity- Dantrolene 25 mg TID- getting from [place] now.   Still taking Baclofen 20 mg BID- works for him- not really spasms middle of day. But has spasms at night.  Not long in duration- annoying, not really painful.   Gabapentin 400 mg at bedtime- for pain/sleep. - used to be on 2-3x/day, so doing OK on at bedtime dosing.    Taking Oxybutynin but taking it's 2x/day- 1 whole pill- but doesn't know dose- sounds like could be 5 mg.    Still hanging in there-  No pain or anything like that.  Trying to stay positive.   No vacation lately- might in fall.  Getting messages from [place] for SCI support group.    [facility]- [facility]- pt has appt coming up. Sees once per year.    [facility] several [facility] appointments with [facility] as well.  Timothy Davenport- out of [place], [place]       Pain Inventory Average Pain 1 Pain Right Now 0 My pain is  na  In the last 24 hours, has pain interfered with the following? General activity 0 Relation with others 0 Enjoyment of life 0 What TIME of day is your pain at its worst? varies Sleep (in general) Fair  Pain is worse with:  na Pain improves with:  na Relief from Meds:  .  Family History  Problem Relation Age of Onset    Hypertension Mother    Hypertension Father    Social History   Socioeconomic History   Marital status: Married    Spouse name: Not on file   Number of children: Not on file   Years of education: Not on file   Highest education level: Not on file  Occupational History   Not on file  Tobacco Use   Smoking status: Never   Smokeless tobacco: Never  Vaping Use   Vaping status: Never Used  Substance and Sexual Activity   Alcohol use: Yes    Comment: 2-3 drinks week   Drug use: Yes    Types: Marijuana    Comment: occasionally   Sexual activity: Not Currently  Other Topics Concern   Not on file  Social History Narrative   Not on file   Social Determinants of Health   Financial Resource Strain: Not on file  Food Insecurity: No Food Insecurity (01/13/2022)   Hunger Vital Sign    Worried About Running Out of Food in the Last Year: Never true    Ran Out of Food in the Last Year: Never true  Transportation Needs: No Transportation Needs (01/13/2022)   PRAPARE - Administrator, Civil Service (Medical):  No    Lack of Transportation (Non-Medical): No  Physical Activity: Not on file  Stress: Not on file  Social Connections: Not on file   Past Surgical History:  Procedure Laterality Date   DEBRIDEMENT AND CLOSURE WOUND N/A 01/19/2022   Procedure: Wound debridement, wound closure, possible wound vac, Myriad placement;  Surgeon: Santiago Glad, MD;  Location: [facility] OR;  Service: Plastics;  Laterality: N/A;   I & D EXTREMITY N/A 01/09/2022   Procedure: EXCISIONAL DEBRIDEMENT BILATERAL GROIN AND LOWER ABDOMEN;  Surgeon: Harriette Bouillon, MD;  Location: [facility] OR;  Service: General;  Laterality: N/A;   IRRIGATION AND DEBRIDEMENT ABSCESS N/A 01/05/2022   Procedure: IRRIGATION AND DEBRIDEMENT NECROTIZING  TISSUE RECTAL ABSCESS;  Surgeon: Despina Arias, MD;  Location: [facility] OR;  Service: Urology;  Laterality: N/A;   LUMBAR LAMINECTOMY/DECOMPRESSION MICRODISCECTOMY N/A 07/01/2019    Procedure: LUMBAR LAMINECTOMY/DECOMPRESSION MICRODISCECTOMY 1 LEVEL, THORACIC SIX-SEVEN;  Surgeon: Lisbeth Renshaw, MD;  Location: [facility] OR;  Service: Neurosurgery;  Laterality: N/A;  LUMBAR LAMINECTOMY/DECOMPRESSION MICRODISCECTOMY 1 LEVEL, THORACIC SIX-SEVEN    ORIF PATELLA     SCROTAL EXPLORATION N/A 01/07/2022   Procedure: DEBRIDEMENT OF PENIS, SCROTUM, TESTICLES, AND GROIN;  Surgeon: Despina Arias, MD;  Location: [facility] OR;  Service: Urology;  Laterality: N/A;   THORACIC DISCECTOMY N/A 07/03/2019   Procedure: Evacuation of Thoracic Hematoma;  Surgeon: Lisbeth Renshaw, MD;  Location: [facility] OR;  Service: Neurosurgery;  Laterality: N/A;   WOUND DEBRIDEMENT N/A 01/07/2022   Procedure: DEBRIDEMENT OF GROIN AND ABDOMINAL WALL;  Surgeon: Fritzi Mandes, MD;  Location: [facility] OR;  Service: General;  Laterality: N/A;   Past Surgical History:  Procedure Laterality Date   DEBRIDEMENT AND CLOSURE WOUND N/A 01/19/2022   Procedure: Wound debridement, wound closure, possible wound vac, Myriad placement;  Surgeon: Santiago Glad, MD;  Location: [facility] OR;  Service: Plastics;  Laterality: N/A;   I & D EXTREMITY N/A 01/09/2022   Procedure: EXCISIONAL DEBRIDEMENT BILATERAL GROIN AND LOWER ABDOMEN;  Surgeon: Harriette Bouillon, MD;  Location: [facility] OR;  Service: General;  Laterality: N/A;   IRRIGATION AND DEBRIDEMENT ABSCESS N/A 01/05/2022   Procedure: IRRIGATION AND DEBRIDEMENT NECROTIZING  TISSUE RECTAL ABSCESS;  Surgeon: Despina Arias, MD;  Location: [facility] OR;  Service: Urology;  Laterality: N/A;   LUMBAR LAMINECTOMY/DECOMPRESSION MICRODISCECTOMY N/A 07/01/2019   Procedure: LUMBAR LAMINECTOMY/DECOMPRESSION MICRODISCECTOMY 1 LEVEL, THORACIC SIX-SEVEN;  Surgeon: Lisbeth Renshaw, MD;  Location: [facility] OR;  Service: Neurosurgery;  Laterality: N/A;  LUMBAR LAMINECTOMY/DECOMPRESSION MICRODISCECTOMY 1 LEVEL, THORACIC SIX-SEVEN    ORIF PATELLA     SCROTAL EXPLORATION N/A 01/07/2022   Procedure: DEBRIDEMENT OF PENIS, SCROTUM,  TESTICLES, AND GROIN;  Surgeon: Despina Arias, MD;  Location: [facility] OR;  Service: Urology;  Laterality: N/A;   THORACIC DISCECTOMY N/A 07/03/2019   Procedure: Evacuation of Thoracic Hematoma;  Surgeon: Lisbeth Renshaw, MD;  Location: [facility] OR;  Service: Neurosurgery;  Laterality: N/A;   WOUND DEBRIDEMENT N/A 01/07/2022   Procedure: DEBRIDEMENT OF GROIN AND ABDOMINAL WALL;  Surgeon: Fritzi Mandes, MD;  Location: [facility] OR;  Service: General;  Laterality: N/A;   Past Medical History:  Diagnosis Date   Colon polyp    Diabetes mellitus without complication (HCC)    Diabetic neuropathy (HCC)    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   HTN (hypertension)    Patella fracture    Renal calculi    BP 137/88   Pulse (!) 55   SpO2 99%   Opioid Risk Score:  Fall Risk Score:  `1  Depression screen [facility] 2/9     09/22/2021    1:02 PM 06/07/2021   11:12 AM 03/08/2021   11:04 AM 11/30/2020   11:07 AM 03/02/2020   11:22 AM 11/27/2019    3:29 PM 08/03/2019   10:14 AM  Depression screen PHQ 2/9  Decreased Interest 0 0 0 0 1 1 3   Down, Depressed, Hopeless 0 0 0 0 1 1 0  PHQ - 2 Score 0 0 0 0 2 2 3   Altered sleeping       0  Tired, decreased energy       0  Change in appetite       1  Feeling bad or failure about yourself        0  Trouble concentrating       0  Moving slowly or fidgety/restless       0  Suicidal thoughts       0  PHQ-9 Score       4  Difficult doing work/chores       Not difficult at all     Review of Systems  Musculoskeletal:        Spasms  All other systems reviewed and are negative.      Objective:   Physical Exam  Awake, alert, appropriate, in manual w/c- accompanied by wife, NAD  MS:  LE's RLE- HF HF 2-/5; KE 4-/5; and DF 2+/5; and PF 4-/5 LLE- HF 2+/5; KE 4+/5; and DF 4/5 and PF 5-/5  Neuro:- MAS of 3-4 in R knee, 3-4 in R hip esp abduction and MAS of 3 in R ankle- No clonus, but really tight LLE- MAS of 1+ all over.         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  Also has hepatic lesions- on liver- dx'd several years ago.    Suggest SCI support group again- - next appt- would be 8/29- at 6pm last Thursday of the month- - at [facility]   2. [facility]- [facility] to be [facility]6 months- can call PCP and have them do labs at end of August- need to do by then. Needs ot be odne since on Dantrolene for spasticity   3. No additional pain on Gabapentin 400 mg nightly-   4. [facility].    5. Con't Dantrolene 25 mg 3x/day- and Baclofen 20 mg BID- and spasticity doing a lot better per pt, but his tone/spasticity/tightness is much tighter- suggest, if PCP ok with Liver labs, to increase dantrolene to at least 50 mg 3x/day- or can take 05 mg in Am and 100 mg at bedtime- would also take Baclofen 20 mg 3x/day- vs 2x/day- for tone.  I wrote how I suggest meds be prescribed for spasticity- if you cannot increase, due to liver function elevation, then call me.    6. Can take a screen shot of labs and send them to me via my chart- has to be in normal limits and can message me via my chart messaging.   7. F/U 6 months- double appt- SCI   I spent a total of 40   minutes on  total care today- >50% coordination of care- due to  Filling out paperwork while in room with pt- and discussing spasticity as well as how to compensate for increased tone- which can get in way of walking long term.

## 2022-09-19 ENCOUNTER — Ambulatory Visit (INDEPENDENT_AMBULATORY_CARE_PROVIDER_SITE_OTHER): Payer: No Typology Code available for payment source | Admitting: Neurology

## 2022-09-19 ENCOUNTER — Encounter: Payer: Self-pay | Admitting: Neurology

## 2022-09-19 VITALS — BP 124/79 | HR 69 | Ht 71.0 in | Wt 185.0 lb

## 2022-09-19 DIAGNOSIS — R29898 Other symptoms and signs involving the musculoskeletal system: Secondary | ICD-10-CM | POA: Diagnosis not present

## 2022-09-19 DIAGNOSIS — R4189 Other symptoms and signs involving cognitive functions and awareness: Secondary | ICD-10-CM | POA: Diagnosis not present

## 2022-09-19 DIAGNOSIS — D329 Benign neoplasm of meninges, unspecified: Secondary | ICD-10-CM

## 2022-09-19 DIAGNOSIS — R269 Unspecified abnormalities of gait and mobility: Secondary | ICD-10-CM

## 2022-09-19 NOTE — Patient Instructions (Signed)
Continue current medications Continue physical therapy Follow-up in 1 year, at that time we will repeat MRI brain for evaluation of the meningioma Return sooner if worse

## 2022-09-19 NOTE — Progress Notes (Signed)
[facility]  PATIENT: Timothy Davenport DOB: 1950/07/12  REQUESTING CLINICIAN: Tessie Eke, PA HISTORY FROM: Spouse and chart review  REASON FOR VISIT: Memory complaints    HISTORICAL  CHIEF COMPLAINT:  Chief Complaint  Patient presents with   Follow-up    Rm 12. Patient with spouse, Jerrye Beavers. Reports no new symptoms to report   INTERVAL HISTORY 09/19/2022:  Patient presents today for follow-up, he is accompanied by his spouse.  Last visit was in January.  Since then he has been doing well, he reported the infection is cleared and he is also off antibiotics.  He continues to get physical therapy.  He still have bilateral lower extremity weakness.  Currently he does not have any complaint or concerns.  Wife believes that he is cognition have improved, he is doing much better compared to 6 months ago.   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  Patient was admitted in the hospital back in November and was found to have gangrene infection in his testicles.  He was treated in the hospital with surgery and antibiotics and also was noted to have hospital delirium.  Since discharge from the hospital his delirium has subsided but wife has reported the patient is more forgetful.  Sometimes she he is not sure if he took his meds and need additional reminder. He needs assistance with ambulation. He is able to transfer from hospital bed to wheelchair,he has PT, OT and home health aid     TBI:   No past history of TBI Stroke:   no past history of stroke Seizures:    no past history of seizures Sleep:   no history of sleep apnea.  Mood:  Yes depression, On sertraline  Family history of Dementia:  Denies  Functional status: Needs help with  some ADLs due to medical condition.  Patient lives with wife . Cooking: patient with wife help  Cleaning: wife  Shopping: wife Bathing: needs help  Toileting: needs help  Driving: no  Bills: Spouse  Medications: Wife helps with his medication  Ever left the stove on by accident?: no Forget how to use items around the house?: no Getting lost going to familiar places?: N/A  Forgetting loved ones names?: denies  Word finding difficulty? Denies  Sleep: good    OTHER MEDICAL CONDITIONS: Diabetes Mellitus, Hypertension, Depression, recent gangrene infection,    REVIEW OF SYSTEMS: Full 14 system review of systems performed and negative with exception of: As noted in the HPI   ALLERGIES: Allergies  Allergen Reactions   Jardiance [Empagliflozin] Other (See Comments)    Fournier Gangrene 2023   Lisinopril Other (See Comments)    Angioedema   Enalapril-Hctz [Enalapril-Hydrochlorothiazide] Rash    HOME MEDICATIONS: Outpatient Medications Prior to Visit  Medication Sig Dispense Refill   acetaminophen (TYLENOL) 325 MG tablet Take 1-2 tablets (325-650 mg total) by mouth every 4 (four) hours as needed for mild pain.     Alogliptin Benzoate 25 MG TABS Take 12.5 tablets by mouth daily with breakfast.     Alogliptin Benzoate 25 MG TABS Take 0.5 tablets by mouth daily.     Ascorbic Acid 500 MG CAPS Take 500 mg by mouth every Monday, Wednesday, and Friday.     aspirin EC 81 MG tablet Take 1  tablet (81 mg total) by mouth daily. Swallow whole. 30 tablet 11   baclofen (LIORESAL) 20 MG tablet TAKE ONE TABLET BY MOUTH TWICE A DAY FOR MUSCLE SPASTICITY. DOSE DECREASE ON 02/26/22.     bisacodyl (DULCOLAX) 10 MG suppository UNWRAP AND INSERT 1 SUPPOSITORY(10 MG) RECTALLY DAILY AFTER SUPPER 30 suppository 0   Cholecalciferol 100 MCG (4000 UT) TABS Take 4,000 Units by mouth daily.     dantrolene (DANTRIUM) 25 MG capsule Take 1 capsule (25 mg total) by mouth 3 (three) times daily. 90 capsule 0   docusate  sodium (COLACE) 50 MG capsule Take 50 mg by mouth daily.     feeding supplement (ENSURE ENLIVE / ENSURE PLUS) LIQD Take 237 mLs by mouth 4 (four) times daily -  with meals and at bedtime. 237 mL 12   ferrous gluconate (FERGON) 324 MG tablet TAKE ONE TABLET BY MOUTH MONDAY, WEDNESDAY AND FRIDAY FOR ANEMIA - TAKE WITH ASCORBIC ACID AS DIRECTED     folic acid (FOLVITE) 1 MG tablet Take 1 mg by mouth daily.     gabapentin (NEURONTIN) 400 MG capsule TAKE ONE CAPSULE BY MOUTH AT BEDTIME DOSE DECREASE 02/26/22     glucose 4 GM chewable tablet Chew 4 tablets by mouth as needed for low blood sugar.     insulin aspart (NOVOLOG) 100 UNIT/ML injection Inject 3 Units into the skin 3 (three) times daily with meals. 10 mL 11   losartan (COZAAR) 100 MG tablet TAKE ONE-HALF TABLET BY MOUTH IN THE MORNING FOR BLOOD PRESSURE. DECREASED DOSE ON 02/26/22.     mirtazapine (REMERON) 15 MG tablet Take 1 tablet (15 mg total) by mouth at bedtime. 30 tablet 0   Multiple Vitamin (MULTIVITAMIN WITH MINERALS) TABS tablet Take 1 tablet by mouth daily. 30 tablet 0   omeprazole (PRILOSEC) 20 MG capsule Take 20 mg by mouth daily.     oxybutynin 2.5 MG TABS Take 7.5 mg by mouth 2 (two) times daily. 60 tablet 0   polyvinyl alcohol (LIQUIFILM TEARS) 1.4 % ophthalmic solution Place 1 drop into both eyes 4 (four) times daily. 15 mL 0   sertraline (ZOLOFT) 100 MG tablet Take 100 mg by mouth daily.     simvastatin (ZOCOR) 20 MG tablet Take 20 mg by mouth daily at 6 PM.     No facility-administered medications prior to visit.    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   HTN (hypertension)    Patella fracture    Renal calculi     PAST SURGICAL HISTORY: Past Surgical History:  Procedure Laterality Date   DEBRIDEMENT AND CLOSURE WOUND N/A 01/19/2022   Procedure: Wound  debridement, wound closure, possible wound vac, Myriad placement;  Surgeon: Santiago Glad, MD;  Location: [facility] OR;  Service: Plastics;  Laterality: N/A;   I & D EXTREMITY N/A 01/09/2022   Procedure: EXCISIONAL DEBRIDEMENT BILATERAL GROIN AND LOWER ABDOMEN;  Surgeon: Harriette Bouillon, MD;  Location: [facility] OR;  Service: General;  Laterality: N/A;   IRRIGATION AND DEBRIDEMENT ABSCESS N/A 01/05/2022   Procedure: IRRIGATION AND DEBRIDEMENT NECROTIZING  TISSUE RECTAL ABSCESS;  Surgeon: Despina Arias, MD;  Location: [facility] OR;  Service: Urology;  Laterality: N/A;   LUMBAR LAMINECTOMY/DECOMPRESSION MICRODISCECTOMY N/A 07/01/2019   Procedure: LUMBAR LAMINECTOMY/DECOMPRESSION MICRODISCECTOMY 1 LEVEL, THORACIC SIX-SEVEN;  Surgeon: Lisbeth Renshaw, MD;  Location: [facility] OR;  Service: Neurosurgery;  Laterality: N/A;  LUMBAR LAMINECTOMY/DECOMPRESSION MICRODISCECTOMY 1 LEVEL, THORACIC SIX-SEVEN    ORIF PATELLA     SCROTAL EXPLORATION N/A 01/07/2022   Procedure: DEBRIDEMENT OF PENIS, SCROTUM, TESTICLES, AND GROIN;  Surgeon: Despina Arias, MD;  Location: [facility] OR;  Service: Urology;  Laterality: N/A;   THORACIC DISCECTOMY N/A 07/03/2019   Procedure: Evacuation of Thoracic Hematoma;  Surgeon: Lisbeth Renshaw, MD;  Location: [facility] OR;  Service: Neurosurgery;  Laterality: N/A;   WOUND DEBRIDEMENT N/A 01/07/2022   Procedure: DEBRIDEMENT OF GROIN AND ABDOMINAL WALL;  Surgeon: Fritzi Mandes, MD;  Location: [facility] OR;  Service: General;  Laterality: N/A;    FAMILY HISTORY: Family History  Problem Relation Age of Onset   Hypertension Mother    Hypertension Father     SOCIAL HISTORY: Social History   Socioeconomic History   Marital status: Married    Spouse name: Not on file   Number of children: Not on file   Years of education: Not on file   Highest education level: Not on file  Occupational History   Not on file  Tobacco Use   Smoking status: Never   Smokeless tobacco: Never  Vaping Use   Vaping status: Never  Used  Substance and Sexual Activity   Alcohol use: Yes    Comment: 2-3 drinks week   Drug use: Yes    Types: Marijuana    Comment: occasionally   Sexual activity: Not Currently  Other Topics Concern   Not on file  Social History Narrative   Not on file   Social Determinants of Health   Financial Resource Strain: Not on file  Food Insecurity: No Food Insecurity (01/13/2022)   Hunger Vital Sign    Worried About Running Out of Food in the Last Year: Never true    Ran Out of Food in the Last Year: Never true  Transportation Needs: No Transportation Needs (01/13/2022)   PRAPARE - Administrator, Civil Service (Medical): No    Lack of Transportation (Non-Medical): No  Physical Activity: Not on file  Stress: Not on file  Social Connections: Not on file  Intimate Partner Violence: Not At Risk (01/13/2022)   Humiliation, Afraid, Rape, and Kick questionnaire    Fear of Current or Ex-Partner: No    Emotionally Abused: No    Physically Abused: No    Sexually Abused: No    PHYSICAL EXAM  GENERAL EXAM/CONSTITUTIONAL: Vitals:  Vitals:   09/19/22 1344  BP: 124/79  Pulse: 69  Weight: 185 lb (83.9 kg)  Height: 5\' 11"  (1.803 m)   Body mass index is 25.8 kg/m. Wt Readings from Last 3 Encounters:  09/19/22 185 lb (83.9 kg)  01/19/22 171 lb 15.3 oz (78 kg)  01/03/22 180 lb (81.6 kg)   Patient is in no distress; well developed, nourished and groomed; laying on the gurney   EYES: Visual fields full to confrontation, Extraocular movements intacts,   MUSCULOSKELETAL: Gait, strength, tone, movements noted in Neurologic exam below  NEUROLOGIC: MENTAL STATUS:      No data to display            09/19/2022    1:45 PM 03/13/2022    2:22 PM  Montreal Cognitive Assessment   Visuospatial/ Executive (0/5) 1 4  Naming (0/3) 3 3  Attention: Read list of digits (0/2) 2 2  Attention: Read list of letters (0/1) 1 1  Attention: Serial 7 subtraction starting at 100 (0/3)  3 3  Language: Repeat phrase (0/2) 2 1  Language : Fluency (0/1) 0 0  Abstraction (0/2) 1 2  Delayed Recall (0/5) 5 2  Orientation (0/6) 6 2  Total 24 20     CRANIAL NERVE:  2nd, 3rd, 4th, 6th- visual fields full to confrontation, extraocular muscles intact, no nystagmus 5th - facial sensation symmetric 7th - facial strength symmetric 8th - hearing intact 9th - palate elevates symmetrically, uvula midline 11th - shoulder shrug symmetric 12th - tongue protrusion midline  SENSORY:  normal and symmetric to light touch   MOTOR  Normal strength in the BUE   5/5 Knee extension bilaterally   3/5 left hip flexion   2/5 right hip flexion   GAIT/STATION:  Deferred    DIAGNOSTIC DATA (LABS, IMAGING, TESTING) - I reviewed patient records, labs, notes, testing and imaging myself where available.  Lab Results  Component Value Date   WBC 6.7 01/22/2022   HGB 8.3 (L) 01/22/2022   HCT 27.2 (L) 01/22/2022   MCV 82.9 01/22/2022   PLT 243 01/22/2022      Component Value Date/Time   NA 141 01/24/2022 0255   NA 137 03/08/2021 1137   K 3.7 01/24/2022 0255   CL 106 01/24/2022 0255   CO2 26 01/24/2022 0255   GLUCOSE 152 (H) 01/24/2022 0255   BUN 11 01/24/2022 0255   BUN 17 03/08/2021 1137   CREATININE 0.85 01/24/2022 0255   CALCIUM 9.8 01/24/2022 0255   PROT 7.7 01/05/2022 0900   PROT 7.9 03/08/2021 1137   ALBUMIN 2.6 (L) 01/05/2022 0900   ALBUMIN 3.9 03/08/2021 1137   AST 16 01/05/2022 0900   ALT 12 01/05/2022 0900   ALKPHOS 71 01/05/2022 0900   BILITOT 0.6 01/05/2022 0900   BILITOT 0.2 03/08/2021 1137   GFRNONAA >60 01/24/2022 0255   GFRAA >60 07/20/2019 0550   No results found for: "CHOL", "HDL", "LDLCALC", "LDLDIRECT", "TRIG", "CHOLHDL" Lab Results  Component Value Date   HGBA1C 6.0 (H) 06/30/2019   No results found for: "VITAMINB12" No results found for: "TSH"  CT Head 01/03/2022 No acute intracranial findings are seen in noncontrast CT brain. Atrophy. There  is 8 mm coarse extra-axial calcification in the left frontoparietal region close to midline with no interval change suggesting dystrophic dural calcification or small calcified meningioma with no significant interval change    ASSESSMENT AND PLAN  72 y.o. 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  Since last visit, patient has completed treatment for recurrent infection. He is off antibiotics. He is doing much better, wife also report that his condition has improved.  On exam today he scored 24 out of 30 on the MoCA which is an improvement from his previous score 20 out of 30.  Plan for now is to continue his current medications, continue physical therapy and I will see him in 1 year, at that time we will obtain brain imaging for surveillance and evaluation of the meningioma.  They both agree with recommendations.    1. Cognitive impairment   2. Meningioma (HCC)   3. Bilateral leg weakness   4. Gait abnormality      Patient Instructions  Continue current medications Continue physical therapy Follow-up in 1 year, at that time we will repeat MRI brain for evaluation of the meningioma Return sooner if worse   No orders of the  defined types were placed in this encounter.   No orders of the defined types were placed in this encounter.   Return in about 1 year (around 09/19/2023).    Windell Norfolk, MD 09/19/2022, 3:08 PM  [facility] [address], [address] [place], [place] [address] [phone]

## 2022-09-24 ENCOUNTER — Ambulatory Visit (INDEPENDENT_AMBULATORY_CARE_PROVIDER_SITE_OTHER): Payer: BC Managed Care – PPO | Admitting: Plastic Surgery

## 2022-09-24 VITALS — BP 135/87 | HR 50 | Resp 18

## 2022-09-24 DIAGNOSIS — N493 Fournier gangrene: Secondary | ICD-10-CM

## 2022-09-24 NOTE — Progress Notes (Signed)
Timothy Davenport returns today for follow-up after treatment for Fournier's gangrene.  He underwent debridement in the operating room with placement of myriad tissue matrix.  After that he had conservative therapy with dressing changes and close follow-up.  Today his wounds are completely healed he does have some hypopigmentation of the shaft of the penis however he reports being able to urinate without difficulty.  I have discussed with his wife and with [name] the fact that I do not see any open wounds and that they may change to a PRN follow-up.  Return as needed.

## 2022-10-17 ENCOUNTER — Encounter: Payer: Self-pay | Admitting: Physical Medicine and Rehabilitation

## 2023-02-07 LAB — LAB REPORT - SCANNED
A1c: 8.1
EGFR: 50

## 2023-03-15 ENCOUNTER — Encounter: Payer: Self-pay | Admitting: Physical Medicine and Rehabilitation

## 2023-03-15 ENCOUNTER — Encounter: Payer: 59 | Attending: Physical Medicine and Rehabilitation | Admitting: Physical Medicine and Rehabilitation

## 2023-03-15 VITALS — BP 133/80 | HR 52 | Ht 71.0 in | Wt 184.4 lb

## 2023-03-15 DIAGNOSIS — Z993 Dependence on wheelchair: Secondary | ICD-10-CM | POA: Insufficient documentation

## 2023-03-15 DIAGNOSIS — R252 Cramp and spasm: Secondary | ICD-10-CM | POA: Diagnosis present

## 2023-03-15 DIAGNOSIS — G8220 Paraplegia, unspecified: Secondary | ICD-10-CM | POA: Insufficient documentation

## 2023-03-15 DIAGNOSIS — N319 Neuromuscular dysfunction of bladder, unspecified: Secondary | ICD-10-CM | POA: Diagnosis present

## 2023-03-15 NOTE — Patient Instructions (Signed)
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  Also has hepatic lesions- on liver- dx'd several years ago.   Need to make sure his liver functions are OK- either need a CMP kidney and liver function- lab or LFTs labs- liver function- should be in last 3 months- should be done every 6 months- no matter what. The risk of problems are ow, but not Zero!  2. Con't with cathing 5-6x/day- every 2-2.5 hours- suggest discussing with Urology when sees them last.    3. Please send me a copy of LFTs- AST/ALT and Alk phos- specifically.  Once I get that, then won't need to order more labs. Can get from [place] and send me screen shot of the labs.     4. Con't Dantrolene- I think 25 mg TID  5. Con't baclofen- I think on 20 mg 2x/day-  6. SCI support group on Thursday January 30th- - is at [facility] [employer] this month and back to [address]-in February - we would love to see him.    7.   F/U in 6 months- double appt- SCI

## 2023-03-15 NOTE — Progress Notes (Signed)
Subjective:    Patient ID: Timothy Davenport, male    DOB: [birth year], 73 y.o.   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  Also has hepatic lesions- on liver- dx'd several years ago.   Still using w/c- for main source of propulsion.  Uses RW to walk in house- but not really outside the house.   Last Rx's.  Not positive, because has had some changes per wife of some meds lately.  Still taking Dantrolene-  25 mg TID Baclofen 20 mg BID.  Gabapentin 400 mg BID- upped to help nerve pain-  Oxybutynin- doesn't know which dose.    A1c was up- and got something else to bring it down. Doesn't know who did.   Still walking in house- probably 40 ft at a time- with RW- 1x/day early in AM when first gets up.   PT/therapy hasn't called to set up more rehab.  They were supposed to call him/and doing in February. Locally in [place].   Thinks meds up to date, but will need to recheck on dosage.   Spasticity doing OK- only has when transferring from w/c to BSC/toilet or to shower chair.   If hits his leg hard, it will stop shaking.  Every now and then occurs in bed, but seldom. Not painful- surprising more than anything.  Just goes away spontaneously.    Still doing caths- as needed- usually when wakes up- caths, and caths 5-6x/day- every 2-2.5 hours needs to cath- feels urge and then caths.  Went to see Dr Catha Gosselin- cystoscopy since last saw me- everything was fine.  When drinks, cath volumes are high- but lower when doesn't drink well- and dark as well-  Doing pretty well keeping up fluid intake.    Never has LFT elevation that pt aware of.    Bowel- able to go on his own- feels needs to go and gets on toilet/BSC- a lot of times,  doesn't need to do dig stim- 2x/month or so does dig stim- no suppository.   Last colonoscopy- 3+ years-   Social Hx; [employer] therapy on [employer]ues[employer] in spring to fall   Pain Inventory Average Pain 0 Pain Right Now 0 My pain is  no pain  In the last 24 hours, has pain interfered with the following? General activity 0 Relation with others 0 Enjoyment of life 0 What TIME of day is your pain at its worst?  Sleep (in general) Good  Pain is worse with:  no pain Pain improves with:  no pain Relief from Meds:  no pain meds  Family History  Problem Relation Age of Onset   Hypertension Mother    Hypertension Father    Social History   Socioeconomic History   Marital status: Married    Spouse name: Not on file   Number of children: Not on file   Years of education: Not on file   Highest education level: Not on file  Occupational History   Not on file  Tobacco Use   Smoking status: Never   Smokeless tobacco: Never  Vaping Use   Vaping status: Never Used  Substance and Sexual Activity   Alcohol use: Yes  Comment: 2-3 drinks week   Drug use: Yes    Types: Marijuana    Comment: occasionally   Sexual activity: Not Currently  Other Topics Concern   Not on file  Social History Narrative   Not on file   Social Drivers of Health   Financial Resource Strain: Not on file  Food Insecurity: No Food Insecurity (01/13/2022)   Hunger Vital Sign    Worried About Running Out of Food in the Last Year: Never true    Ran Out of Food in the Last Year: Never true  Transportation Needs: No Transportation Needs (01/13/2022)   PRAPARE - Administrator, Civil Service (Medical): No    Lack of Transportation (Non-Medical): No  Physical Activity: Not on file  Stress: Not on file  Social Connections: Not on file   Past Surgical History:  Procedure Laterality Date   DEBRIDEMENT AND CLOSURE WOUND N/A 01/19/2022   Procedure: Wound debridement, wound closure,  possible wound vac, Myriad placement;  Surgeon: Santiago Glad, MD;  [facility];  Service: Plastics;  Laterality: N/A;   I & D EXTREMITY N/A 01/09/2022   Procedure: EXCISIONAL DEBRIDEMENT BILATERAL GROIN AND LOWER ABDOMEN;  Surgeon: Harriette Bouillon, MD;  Location: [facility];  Service: General;  Laterality: N/A;   IRRIGATION AND DEBRIDEMENT ABSCESS N/A 01/05/2022   Procedure: IRRIGATION AND DEBRIDEMENT NECROTIZING  TISSUE RECTAL ABSCESS;  Surgeon: Despina Arias, MD;  Location: [facility] OR;  Service: Urology;  Laterality: N/A;   LUMBAR LAMINECTOMY/DECOMPRESSION MICRODISCECTOMY N/A 07/01/2019   Procedure: LUMBAR LAMINECTOMY/DECOMPRESSION MICRODISCECTOMY 1 LEVEL, THORACIC SIX-SEVEN;  Surgeon: Lisbeth Renshaw, MD;  [facility];  Service: Neurosurgery;  Laterality: N/A;  LUMBAR LAMINECTOMY/DECOMPRESSION MICRODISCECTOMY 1 LEVEL, THORACIC SIX-SEVEN    ORIF PATELLA     SCROTAL EXPLORATION N/A 01/07/2022   Procedure: DEBRIDEMENT OF PENIS, SCROTUM, TESTICLES, AND GROIN;  Surgeon: Despina Arias, MD;  [facility];  Service: Urology;  Laterality: N/A;   THORACIC DISCECTOMY N/A 07/03/2019   Procedure: Evacuation of Thoracic Hematoma;  Surgeon: Lisbeth Renshaw, MD;  Location: [facility] OR;  Service: Neurosurgery;  Laterality: N/A;   WOUND DEBRIDEMENT N/A 01/07/2022   Procedure: DEBRIDEMENT OF GROIN AND ABDOMINAL WALL;  Surgeon: Fritzi Mandes, MD;  [facility];  Service: General;  Laterality: N/A;   Past Surgical History:  Procedure Laterality Date   DEBRIDEMENT AND CLOSURE WOUND N/A 01/19/2022   Procedure: Wound debridement, wound closure, possible wound vac, Myriad placement;  Surgeon: Santiago Glad, MD;  [facility];  Service: Plastics;  Laterality: N/A;   I & D EXTREMITY N/A 01/09/2022   Procedure: EXCISIONAL DEBRIDEMENT BILATERAL GROIN AND LOWER ABDOMEN;  Surgeon: Harriette Bouillon, MD;  [facility];  Service: General;  Laterality: N/A;   IRRIGATION AND DEBRIDEMENT ABSCESS N/A  01/05/2022   Procedure: IRRIGATION AND DEBRIDEMENT NECROTIZING  TISSUE RECTAL ABSCESS;  Surgeon: Despina Arias, MD;  Location: [facility] OR;  Service: Urology;  Laterality: N/A;   LUMBAR LAMINECTOMY/DECOMPRESSION MICRODISCECTOMY N/A 07/01/2019   Procedure: LUMBAR LAMINECTOMY/DECOMPRESSION MICRODISCECTOMY 1 LEVEL, THORACIC SIX-SEVEN;  Surgeon: Lisbeth Renshaw, MD;  [facility];  Service: Neurosurgery;  Laterality: N/A;  LUMBAR LAMINECTOMY/DECOMPRESSION MICRODISCECTOMY 1 LEVEL, THORACIC SIX-SEVEN    ORIF PATELLA     SCROTAL EXPLORATION N/A 01/07/2022   Procedure: DEBRIDEMENT OF PENIS, SCROTUM, TESTICLES, AND GROIN;  Surgeon: Despina Arias, MD;  [facility];  Service: Urology;  Laterality: N/A;   THORACIC DISCECTOMY N/A 07/03/2019   Procedure: Evacuation of Thoracic Hematoma;  Surgeon: Lisbeth Renshaw, MD;  Location: [facility] OR;  Service: Neurosurgery;  Laterality: N/A;   WOUND DEBRIDEMENT N/A 01/07/2022   Procedure: DEBRIDEMENT OF GROIN AND ABDOMINAL WALL;  Surgeon: Fritzi Mandes, MD;  [facility];  Service: General;  Laterality: N/A;   Past Medical History:  Diagnosis Date   Colon polyp    Diabetes mellitus without complication (HCC)    Diabetic neuropathy (HCC)    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   HTN (hypertension)    Patella fracture    Renal calculi    BP 133/80   Pulse (!) 52   Ht 5\' 11"  (1.803 m)   Wt 184 lb 6.4 oz (83.6 kg)   SpO2 100%   BMI 25.72 kg/m   Opioid Risk Score:   Fall Risk Score:  `1  Depression screen [facility] 2/9     03/15/2023   10:25 AM 09/22/2021    1:02 PM 06/07/2021   11:12 AM 03/08/2021   11:04 AM 11/30/2020   11:07 AM 03/02/2020   11:22 AM 11/27/2019    3:29 PM  Depression screen PHQ 2/9  Decreased Interest 0 0 0 0 0 1 1  Down, Depressed, Hopeless 0 0 0 0 0 1 1  PHQ - 2 Score 0 0 0 0 0 2 2    Review of Systems  Musculoskeletal:        Spasms  All other systems reviewed and are negative.      Objective:   Physical  Exam  Awake, alert, appropriate in manual transport w/c from clinic- accompanied by wife, NAD  Neuro: MAS of 2 to 3 in LLE- throughout- but only 3-4 beats clonus- decreased ROM of ankle- has ~ 45 degrees total.  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  Also has hepatic lesions- on liver- dx'd several years ago.   Need to make sure his liver functions are OK- either need a CMP kidney and liver function- lab or LFTs labs- liver function- should be in last 3 months- should be done every 6 months- no matter what. The risk of problems are ow, but not Zero!  2. Con't with cathing 5-6x/day- every 2-2.5 hours- suggest discussing with Urology when sees them last.    3. Please send me a copy of LFTs- AST/ALT and Alk phos- specifically.  Once I get that, then won't need to order more labs. Can get from [place] and send me screen shot of the labs.     4. Con't Dantrolene- I think 25 mg TID  5. Con't baclofen- I think on 20 mg 2x/day-  6. SCI support group on Thursday January 30th- - is at [employer] this month and back to [address]-in February - we would love to see him.    7.   F/U in 6 months- double appt- SCI   I spent a total of  34   minutes on total care today- >50% coordination of care- due to d/w pt about social issues; spasticity- and trying to figure out  dosing of meds.

## 2023-09-18 ENCOUNTER — Encounter: Payer: 59 | Admitting: Physical Medicine and Rehabilitation

## 2023-09-19 ENCOUNTER — Encounter: Payer: Self-pay | Admitting: Neurology

## 2023-09-19 ENCOUNTER — Ambulatory Visit: Payer: BC Managed Care – PPO | Admitting: Neurology

## 2023-09-20 ENCOUNTER — Telehealth: Payer: Self-pay | Admitting: Neurology

## 2023-09-20 NOTE — Telephone Encounter (Signed)
 Request to r/s due to a conflict, pt is on wait list

## 2023-12-02 ENCOUNTER — Encounter: Payer: Self-pay | Admitting: Physical Medicine and Rehabilitation

## 2023-12-02 ENCOUNTER — Encounter: Attending: Physical Medicine and Rehabilitation | Admitting: Physical Medicine and Rehabilitation

## 2023-12-02 VITALS — BP 112/73 | HR 75 | Ht 71.0 in | Wt 184.6 lb

## 2023-12-02 DIAGNOSIS — Z794 Long term (current) use of insulin: Secondary | ICD-10-CM

## 2023-12-02 DIAGNOSIS — M4714 Other spondylosis with myelopathy, thoracic region: Secondary | ICD-10-CM | POA: Diagnosis present

## 2023-12-02 DIAGNOSIS — G8220 Paraplegia, unspecified: Secondary | ICD-10-CM | POA: Diagnosis not present

## 2023-12-02 DIAGNOSIS — E119 Type 2 diabetes mellitus without complications: Secondary | ICD-10-CM | POA: Insufficient documentation

## 2023-12-02 DIAGNOSIS — R252 Cramp and spasm: Secondary | ICD-10-CM | POA: Insufficient documentation

## 2023-12-02 DIAGNOSIS — Z993 Dependence on wheelchair: Secondary | ICD-10-CM | POA: Diagnosis present

## 2023-12-02 MED ORDER — PROMETHAZINE HCL 12.5 MG PO TABS: 12.5000 mg | ORAL_TABLET | Freq: Three times a day (TID) | ORAL | 5 refills | Status: AC | PRN

## 2023-12-02 NOTE — Patient Instructions (Signed)
 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  Also has hepatic lesions- on liver- dx'd several years ago.   Might need to take Miralax  daily or Senna at least 1-2 tabs daily- for constipation related to Ozempic- esp in SCI pts. .  2.  I will give you an antinausea medicine. Phenergan /promethazine  12.5 mg up to 3x/day as needed for nausea related to Ozempic.   3. Calling [facility]- waiting on approval from [place] to cover community care at [facility].  1 week ago, started process.    4.  Let me know what the A1c is- last one I saw was 8.1. Last   Cannot find the labs from Care everywhere.   5.  Need to check Liver levels- should be checked 2x/year- while on Dantrolene - easiest lab to check them is a CMP-  Will let PCP check it, per pt.   6.  [facility]- and [facility]. Doesn't wear hearing aids.   7.  Lay on your abdomen push up on chest- and that will extend knees and hips- - will improve ability to walk-  needs ot do 2x/day. Can do more, but don't do less- 5 minutes at most at a time.   8. Using his tone to stand/walk, so cannot take more spasticity away, so don't let anyone increase meds willy nilly'   9. F/U- 6 months SCI double appt

## 2023-12-02 NOTE — Progress Notes (Signed)
 Subjective:    Patient ID: Timothy Davenport, male    DOB: 04/20/1950, 73 y.o.   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  Also has hepatic lesions- on liver- dx'd several years ago.  Here for f/u on SCI.    Changed meds Not taking  actos  anymore- will start Ozempic shot Stopped Glipizide  starting Wednesday  Still on Dantrolene  25 mg TID Baclofen  20 mg BID  Takes Colace daily 1pill/day As needed Miralax .     Everything else going well.  Went to PCP a PA [employer] She suggested the Ozempic. Got approval from insurance- For BG's and weight gain.    Has gained 20 lbs in last 2 months.   Wants to go to program at [employer] and exercising.    Walking ~ daily- 40-50 ft with RW. Stable.   Last A1c I can find was 8.1- but pt doesn't know how high it is now.    Still having a few spasms- not severe- when transferring toilet to w/c- occurs- 25% and has them in bed- lasts 1-2x/week- couple seconds.    Pain Inventory Average Pain 0 Pain Right Now N/A My pain is N/A  In the last 24 hours, has pain interfered with the following? General activity 1 Relation with others 1 Enjoyment of life 1 What TIME of day is your pain at its worst? No answer Sleep (in general) Good  Pain is worse with: walking and standing Pain improves with: rest Relief from Meds: 1  Family History  Problem Relation Age of Onset   Hypertension Mother    Hypertension Father    Social History   Socioeconomic History   Marital status: Married    Spouse name: Not on file   Number of children: Not on file   Years of education: Not on file   Highest education level: Not on file  Occupational History   Not on file  Tobacco Use    Smoking status: Never   Smokeless tobacco: Never  Vaping Use   Vaping status: Never Used  Substance and Sexual Activity   Alcohol  use: Yes    Comment: 2-3 drinks week   Drug use: Yes    Types: Marijuana    Comment: occasionally   Sexual activity: Not Currently  Other Topics Concern   Not on file  Social History Narrative   Not on file   Social Drivers of Health   Financial Resource Strain: Not on file  Food Insecurity: No Food Insecurity (01/13/2022)   Hunger Vital Sign    Worried About Running Out of Food in the Last Year: Never true    Ran Out of Food in the Last Year: Never true  Transportation Needs: No Transportation Needs (01/13/2022)   PRAPARE - Administrator, Civil Service (Medical): No    Lack of Transportation (Non-Medical): No  Physical Activity: Not on file  Stress: Not on file  Social Connections: Not on file   Past Surgical History:  Procedure Laterality Date   DEBRIDEMENT AND CLOSURE WOUND N/A 01/19/2022   Procedure: Wound debridement, wound closure, possible wound vac, Myriad placement;  Surgeon: Waddell Leonce NOVAK, MD;  Location: [facility] OR;  Service:  Plastics;  Laterality: N/A;   I & D EXTREMITY N/A 01/09/2022   Procedure: EXCISIONAL DEBRIDEMENT BILATERAL GROIN AND LOWER ABDOMEN;  Surgeon: Vanderbilt Ned, MD;  Location: [facility];  Service: General;  Laterality: N/A;   IRRIGATION AND DEBRIDEMENT ABSCESS N/A 01/05/2022   Procedure: IRRIGATION AND DEBRIDEMENT NECROTIZING  TISSUE RECTAL ABSCESS;  Surgeon: Lovie Arlyss CROME, MD;  Location: [facility] OR;  Service: Urology;  Laterality: N/A;   LUMBAR LAMINECTOMY/DECOMPRESSION MICRODISCECTOMY N/A 07/01/2019   Procedure: LUMBAR LAMINECTOMY/DECOMPRESSION MICRODISCECTOMY 1 LEVEL, THORACIC SIX-SEVEN;  Surgeon: Lanis Pupa, MD;  [facility];  Service: Neurosurgery;  Laterality: N/A;  LUMBAR LAMINECTOMY/DECOMPRESSION MICRODISCECTOMY 1 LEVEL, THORACIC SIX-SEVEN    ORIF PATELLA     SCROTAL EXPLORATION N/A 01/07/2022    Procedure: DEBRIDEMENT OF PENIS, SCROTUM, TESTICLES, AND GROIN;  Surgeon: Lovie Arlyss CROME, MD;  [facility];  Service: Urology;  Laterality: N/A;   THORACIC DISCECTOMY N/A 07/03/2019   Procedure: Evacuation of Thoracic Hematoma;  Surgeon: Lanis Pupa, MD;  Location: [facility] OR;  Service: Neurosurgery;  Laterality: N/A;   WOUND DEBRIDEMENT N/A 01/07/2022   Procedure: DEBRIDEMENT OF GROIN AND ABDOMINAL WALL;  Surgeon: Dasie Leonor CROME, MD;  [facility];  Service: General;  Laterality: N/A;   Past Surgical History:  Procedure Laterality Date   DEBRIDEMENT AND CLOSURE WOUND N/A 01/19/2022   Procedure: Wound debridement, wound closure, possible wound vac, Myriad placement;  Surgeon: Waddell Leonce NOVAK, MD;  [facility];  Service: Plastics;  Laterality: N/A;   I & D EXTREMITY N/A 01/09/2022   Procedure: EXCISIONAL DEBRIDEMENT BILATERAL GROIN AND LOWER ABDOMEN;  Surgeon: Vanderbilt Ned, MD;  [facility];  Service: General;  Laterality: N/A;   IRRIGATION AND DEBRIDEMENT ABSCESS N/A 01/05/2022   Procedure: IRRIGATION AND DEBRIDEMENT NECROTIZING  TISSUE RECTAL ABSCESS;  Surgeon: Lovie Arlyss CROME, MD;  Location: [facility] OR;  Service: Urology;  Laterality: N/A;   LUMBAR LAMINECTOMY/DECOMPRESSION MICRODISCECTOMY N/A 07/01/2019   Procedure: LUMBAR LAMINECTOMY/DECOMPRESSION MICRODISCECTOMY 1 LEVEL, THORACIC SIX-SEVEN;  Surgeon: Lanis Pupa, MD;  [facility];  Service: Neurosurgery;  Laterality: N/A;  LUMBAR LAMINECTOMY/DECOMPRESSION MICRODISCECTOMY 1 LEVEL, THORACIC SIX-SEVEN    ORIF PATELLA     SCROTAL EXPLORATION N/A 01/07/2022   Procedure: DEBRIDEMENT OF PENIS, SCROTUM, TESTICLES, AND GROIN;  Surgeon: Lovie Arlyss CROME, MD;  [facility];  Service: Urology;  Laterality: N/A;   THORACIC DISCECTOMY N/A 07/03/2019   Procedure: Evacuation of Thoracic Hematoma;  Surgeon: Lanis Pupa, MD;  Location: [facility] OR;  Service: Neurosurgery;  Laterality: N/A;   WOUND DEBRIDEMENT N/A 01/07/2022    Procedure: DEBRIDEMENT OF GROIN AND ABDOMINAL WALL;  Surgeon: Dasie Leonor CROME, MD;  [facility];  Service: General;  Laterality: N/A;   Past Medical History:  Diagnosis Date   Colon polyp    Diabetes mellitus without complication (HCC)    Diabetic neuropathy (HCC)    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   HTN (hypertension)    Patella fracture    Renal calculi    BP 112/73 (Patient Position: Sitting, Cuff Size: Normal)   Pulse 75   Ht 5' 11 (1.803 m)   Wt 184 lb 9.6 oz (83.7 kg)   SpO2 95%   BMI 25.75 kg/m   Opioid Risk Score:   Fall Risk Score:  `1  Depression screen [facility] 2/9     03/15/2023   10:25 AM 09/22/2021    1:02 PM 06/07/2021   11:12 AM 03/08/2021   11:04 AM 11/30/2020   11:07 AM 03/02/2020  11:22 AM 11/27/2019    3:29 PM  Depression screen PHQ 2/9  Decreased Interest 0 0 0 0 0 1 1  Down, Depressed, Hopeless 0 0 0 0 0 1 1  PHQ - 2 Score 0 0 0 0 0 2 2      Review of Systems An entire ROS was completed and negative except HPI     Objective:   Physical Exam  Awake, alert, appropriate, accompanied by wife, in level 3 manual w/c, NAD MSK: RLE_ HF  2/5; KE 4-/5; DF 3-/5; PF 4-/5 LLE- HF 4-/5; KE 4/5; KF 3-/5; DF 4/5 and PF 5-/5   Neuro: Couple beats clonus MAS of 3 in LE's- B/L R kneeis losing ROM- lacking 10-15 degrees of extension     Assessment & Plan:   Patient is a 73 yr old male with DM on insulin  and HTN as well as 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  Also has hepatic lesions- on liver- dx'd several years ago.   Might need to take Miralax  daily or Senna at least 1-2 tabs daily- for constipation related to Ozempic- esp in SCI pts. .  2.  I will give you an antinausea medicine. Phenergan /promethazine  12.5 mg up to 3x/day as needed for nausea related to Ozempic.   3.  Calling [facility]- waiting on approval from [place] to cover community care at [facility].  1 week ago, started process.    4.  Let me know what the A1c is- last one I saw was 8.1. Last   Cannot find the labs from Care everywhere.   5.  Need to check Liver levels- should be checked 2x/year- while on Dantrolene - easiest lab to check them is a CMP-  Will let PCP check it, per pt.   6.  [facility]- and go from [facility]. Doesn't wear hearing aids.   7.  Lay on your abdomen push up on chest- and that will extend knees and hips- - will improve ability to walk-  needs ot do 2x/day. Can do more, but don't do less- 5 minutes at most at a time.   8. Using his tone to stand/walk, so cannot take more spasticity away, so don't let anyone increase meds willy nilly'   9. F/U- 6 months SCI double appt    I spent a total of  34  minutes on total care today- >50% coordination of care- due to d/w pt about DM, BG's, A1c, spasticity, hearing and liver levels while on Dantrolene . Also d/w Ozempic/weight loss.

## 2024-01-22 NOTE — Therapy (Unsigned)
 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   HTN (hypertension)    Patella fracture    Renal calculi    Past Surgical History:  Procedure Laterality Date   DEBRIDEMENT AND CLOSURE WOUND N/A 01/19/2022   Procedure: Wound debridement, wound closure, possible wound vac, Myriad placement;  Surgeon: Waddell Leonce NOVAK, MD;  Location: [facility] OR;  Service: Plastics;  Laterality: N/A;   I & D EXTREMITY N/A 01/09/2022   Procedure: EXCISIONAL DEBRIDEMENT BILATERAL GROIN AND LOWER ABDOMEN;  Surgeon: Vanderbilt Ned, MD;  Location: [facility] OR;  Service: General;  Laterality: N/A;   IRRIGATION AND DEBRIDEMENT ABSCESS N/A 01/05/2022   Procedure: IRRIGATION AND DEBRIDEMENT NECROTIZING  TISSUE RECTAL ABSCESS;  Surgeon: Lovie Arlyss LITTIE, MD;  Location: [facility] OR;  Service: Urology;  Laterality: N/A;   LUMBAR LAMINECTOMY/DECOMPRESSION MICRODISCECTOMY N/A 07/01/2019   Procedure: LUMBAR LAMINECTOMY/DECOMPRESSION MICRODISCECTOMY 1 LEVEL, THORACIC SIX-SEVEN;  Surgeon: Lanis Pupa, MD;  Location: [facility] OR;  Service: Neurosurgery;  Laterality: N/A;  LUMBAR LAMINECTOMY/DECOMPRESSION MICRODISCECTOMY 1 LEVEL, THORACIC SIX-SEVEN    ORIF PATELLA     SCROTAL EXPLORATION N/A 01/07/2022   Procedure: DEBRIDEMENT OF PENIS, SCROTUM, TESTICLES, AND GROIN;  Surgeon: Lovie Arlyss LITTIE, MD;  Location: [facility] OR;  Service: Urology;  Laterality: N/A;   THORACIC DISCECTOMY N/A 07/03/2019   Procedure: Evacuation of Thoracic Hematoma;  Surgeon: Lanis Pupa, MD;  Location: [facility] OR;  Service: Neurosurgery;  Laterality: N/A;   WOUND DEBRIDEMENT N/A 01/07/2022   Procedure: DEBRIDEMENT OF GROIN AND ABDOMINAL WALL;  Surgeon: Dasie Leonor LITTIE, MD;  Location: [facility] OR;  Service: General;  Laterality: N/A;   Patient Active Problem List   Diagnosis Date Noted   Acute delirium 01/18/2022   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   Malnutrition of moderate degree 04/25/2021   Liver lesion    Urinary tract infection with hematuria    Lower GI bleed 04/24/2021   Anemia 04/24/2021   Essential hypertension 04/24/2021   Diabetes mellitus type 2 in nonobese (HCC) 04/24/2021   Acute GI bleeding 04/24/2021   Erectile dysfunction due to diseases classified elsewhere 03/02/2020   Wheelchair dependence 11/27/2019   Spasticity 08/03/2019   Paraplegia (HCC) 07/07/2019   Neurogenic bowel 07/07/2019   Neurogenic bladder 07/07/2019   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 {OPRCPAIN:27236}  PRECAUTIONS: {Therapy precautions:24002}  WEIGHT BEARING RESTRICTIONS: {Yes ***/No:24003}  FALLS:  Has patient fallen in last 6 months? {fallsyesno:27318}  LIVING ENVIRONMENT: Lives with: {[facility] lives with:25569::lives with their family} Lives in: {Lives in:25570} Stairs: {opstairs:27293} Has following equipment at home: {Assistive devices:23999}  OCCUPATION: ***  PLOF: {PLOF:24004}  PATIENT GOALS: ***  NEXT MD VISIT: ***  OBJECTIVE: (objective measures from initial evaluation unless otherwise dated)  DIAGNOSTIC FINDINGS: ***  PATIENT SURVEYS:  {rehab surveys:24030}  COGNITION: Overall cognitive  status: {cognition:24006}     SENSATION: {sensation:27233}  EDEMA:  {edema:24020}  MUSCLE  LENGTH: Hamstrings: Right *** deg; Left *** deg Debby test: Right *** deg; Left *** deg  POSTURE: {posture:25561}  PALPATION: ***  LOWER EXTREMITY ROM:  {AROM/PROM:27142} ROM Right eval Left eval  Hip flexion    Hip extension    Hip abduction    Hip adduction    Hip internal rotation    Hip external rotation    Knee flexion    Knee extension    Ankle dorsiflexion    Ankle plantarflexion    Ankle inversion    Ankle eversion     (Blank rows = not tested) *= pain/symptoms  LOWER EXTREMITY MMT:  MMT Right eval Left eval  Hip flexion    Hip extension    Hip abduction    Hip adduction    Hip internal rotation    Hip external rotation    Knee flexion    Knee extension    Ankle dorsiflexion    Ankle plantarflexion    Ankle inversion    Ankle eversion     (Blank rows = not tested) *= pain/symptoms  LOWER EXTREMITY SPECIAL TESTS:  {LEspecialtests:26242}  FUNCTIONAL TESTS:  {Functional tests:24029}  GAIT: Distance walked: *** Assistive device utilized: {Assistive devices:23999} Level of assistance: {Levels of assistance:24026} Comments: ***   TODAY'S TREATMENT:                                                                                                                              DATE: ***    PATIENT EDUCATION:  Education details: Patient educated on exam findings, POC, scope of PT, HEP, relevant anatomy and biomechanics, and ***. Person educated: Patient Education method: Explanation, Demonstration, and Handouts Education comprehension: verbalized understanding, returned demonstration, verbal cues required, and tactile cues required  HOME EXERCISE PROGRAM: ***  ASSESSMENT:  CLINICAL IMPRESSION: Patient a 73 y.o. y.o. male who was seen today for physical therapy evaluation and treatment for ***. Patient presents with pain limited deficits in *** strength, ROM, endurance, activity tolerance, and functional mobility with ADL. Patient is having to  modify and restrict ADL as indicated by outcome measure score as well as subjective information and objective measures which is affecting overall participation. Patient will benefit from skilled physical therapy in order to improve function and reduce impairment.  OBJECTIVE IMPAIRMENTS: {azimpairmentsle:33064::Abnormal gait,decreased activity tolerance,decreased balance,decreased endurance,decreased mobility,difficulty walking,decreased ROM,decreased strength,increased muscle spasms,impaired flexibility,improper body mechanics,pain}  ACTIVITY LIMITATIONS: {azactivitylimitationsle:33066::lifting,bending,standing,squatting,stairs,transfers,locomotion level,caring for others}  PARTICIPATION LIMITATIONS: {azparticipationrestrictionsle:33067::meal prep,cleaning,laundry,shopping,community activity,occupation,yard work}  PERSONAL FACTORS: {Personal factors:25162} are also affecting patient's functional outcome.   REHAB POTENTIAL: {rehabpotential:25112}  CLINICAL DECISION MAKING: {clinical decision making:25114}  EVALUATION COMPLEXITY: {Evaluation complexity:25115}   GOALS: Goals reviewed with patient? {yes/no:20286}  SHORT TERM GOALS: Target date: ***  Patient will be independent with HEP in order to improve functional outcomes. Baseline: Goal status: {GOALSTATUS:25110}  2.  Patient will report at least 25% improvement in  symptoms for improved quality of life. Baseline: Goal status: {GOALSTATUS:25110}  3.  *** Baseline: *** Goal status: {GOALSTATUS:25110}  4.  *** Baseline: *** Goal status: {GOALSTATUS:25110}  5.  *** Baseline: *** Goal status: {GOALSTATUS:25110}  6.  *** Baseline: *** Goal status: {GOALSTATUS:25110}  LONG TERM GOALS: Target date: ***  Patient will report at least 75% improvement in symptoms for improved quality of life. Baseline:  Goal status: {GOALSTATUS:25110}  2.  Patient will improve FOTO score by  at least *** points in order to indicate improved tolerance to activity. Baseline: *** Goal status: {GOALSTATUS:25110}  3.  Patient will be able to navigate stairs with reciprocal pattern without compensation in order to demonstrate improved LE strength. Baseline: *** Goal status: {GOALSTATUS:25110}  4.  Patient will be able to ambulate at least *** feet in in order to demonstrate improved tolerance to activity. Baseline: *** Goal status: {GOALSTATUS:25110}  5.  Patient will be able to complete 5x STS in under 11.4 seconds in order to reduce the risk of falls. Baseline: *** Goal status: {GOALSTATUS:25110}  6.  Patient will demonstrate grade of 5/5 MMT grade in all tested musculature as evidence of improved strength to assist with stair ambulation and gait.   Baseline: *** Goal status: {GOALSTATUS:25110}   PLAN:  PT FREQUENCY: {rehab frequency:25116}  PT DURATION: {rehab duration:25117}  PLANNED INTERVENTIONS: 97164- PT Re-evaluation, 97110-Therapeutic exercises, 97530- Therapeutic activity, [id number]- Neuromuscular re-education, 97535- Self Care, 02859- Manual therapy, [id number]- Gait training, [id number]- Orthotic Fit/training, [phone]- Canalith repositioning, [id number]- Aquatic Therapy, [phone]- Splinting, [phone]- Wound care (first 20 sq cm), 97598- Wound care (each additional 20 sq cm)Patient/Family education, Balance training, Stair training, Taping, Dry Needling, Joint mobilization, Joint manipulation, Spinal manipulation, Spinal mobilization, Scar mobilization, and DME instructions.  PLAN FOR NEXT SESSION: ***   Lili Finder, Student-PT 01/22/2024, 12:12 PM

## 2024-01-23 ENCOUNTER — Other Ambulatory Visit: Payer: Self-pay

## 2024-01-23 ENCOUNTER — Ambulatory Visit (HOSPITAL_BASED_OUTPATIENT_CLINIC_OR_DEPARTMENT_OTHER): Attending: Physician Assistant | Admitting: Physical Therapy

## 2024-01-23 ENCOUNTER — Encounter (HOSPITAL_BASED_OUTPATIENT_CLINIC_OR_DEPARTMENT_OTHER): Payer: Self-pay | Admitting: Physical Therapy

## 2024-01-23 DIAGNOSIS — R531 Weakness: Secondary | ICD-10-CM | POA: Insufficient documentation

## 2024-01-23 DIAGNOSIS — R6889 Other general symptoms and signs: Secondary | ICD-10-CM | POA: Diagnosis present

## 2024-01-23 DIAGNOSIS — R2689 Other abnormalities of gait and mobility: Secondary | ICD-10-CM | POA: Diagnosis present

## 2024-01-23 DIAGNOSIS — R29898 Other symptoms and signs involving the musculoskeletal system: Secondary | ICD-10-CM | POA: Diagnosis present

## 2024-01-23 DIAGNOSIS — Z7409 Other reduced mobility: Secondary | ICD-10-CM | POA: Insufficient documentation

## 2024-01-23 DIAGNOSIS — M6281 Muscle weakness (generalized): Secondary | ICD-10-CM | POA: Insufficient documentation

## 2024-01-31 ENCOUNTER — Ambulatory Visit (HOSPITAL_BASED_OUTPATIENT_CLINIC_OR_DEPARTMENT_OTHER): Admitting: Physical Therapy

## 2024-01-31 ENCOUNTER — Encounter (HOSPITAL_BASED_OUTPATIENT_CLINIC_OR_DEPARTMENT_OTHER): Payer: Self-pay | Admitting: Physical Therapy

## 2024-01-31 DIAGNOSIS — R531 Weakness: Secondary | ICD-10-CM

## 2024-01-31 DIAGNOSIS — R2689 Other abnormalities of gait and mobility: Secondary | ICD-10-CM

## 2024-01-31 DIAGNOSIS — R29898 Other symptoms and signs involving the musculoskeletal system: Secondary | ICD-10-CM

## 2024-01-31 DIAGNOSIS — M6281 Muscle weakness (generalized): Secondary | ICD-10-CM

## 2024-01-31 NOTE — Therapy (Signed)
 OUTPATIENT PHYSICAL THERAPY LOWER EXTREMITY TREATMENT   Patient Name: Timothy Davenport MRN: [id number] DOB:03/13/1950, 73 y.o., male Today's Date: 01/31/2024  END OF SESSION:  PT End of Session - 01/31/24 1221     Visit Number 2    Number of Visits 20    Date for Recertification  04/02/24    Authorization Type [facility]    Authorization Time Period 11/26/23 - 03/25/24    Authorization - Number of Visits 15    Progress Note Due on Visit 10    PT Start Time 1200   pt arrives late for appt   PT Stop Time 1230    PT Time Calculation (min) 30 min    Activity Tolerance Patient tolerated treatment well    Behavior During Therapy [facility] for tasks assessed/performed            Past Medical History:  Diagnosis Date   Colon polyp    Diabetes mellitus without complication (HCC)    Diabetic neuropathy (HCC)    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   HTN (hypertension)    Patella fracture    Renal calculi    Past Surgical History:  Procedure Laterality Date   DEBRIDEMENT AND CLOSURE WOUND N/A 01/19/2022   Procedure: Wound debridement, wound closure, possible wound vac, Myriad placement;  Surgeon: Waddell Leonce NOVAK, MD;  Location: [facility] OR;  Service: Plastics;  Laterality: N/A;   I & D EXTREMITY N/A 01/09/2022   Procedure: EXCISIONAL DEBRIDEMENT BILATERAL GROIN AND LOWER ABDOMEN;  Surgeon: Vanderbilt Ned, MD;  Location: [facility] OR;  Service: General;  Laterality: N/A;   IRRIGATION AND DEBRIDEMENT ABSCESS N/A 01/05/2022   Procedure: IRRIGATION AND DEBRIDEMENT NECROTIZING  TISSUE RECTAL ABSCESS;  Surgeon: Lovie Arlyss LITTIE, MD;  Location: [facility] OR;  Service: Urology;  Laterality: N/A;   LUMBAR LAMINECTOMY/DECOMPRESSION MICRODISCECTOMY N/A 07/01/2019   Procedure: LUMBAR LAMINECTOMY/DECOMPRESSION MICRODISCECTOMY 1 LEVEL, THORACIC SIX-SEVEN;  Surgeon: Lanis Pupa, MD;  Location: [facility] OR;  Service: Neurosurgery;  Laterality: N/A;  LUMBAR LAMINECTOMY/DECOMPRESSION MICRODISCECTOMY 1 LEVEL, THORACIC  SIX-SEVEN    ORIF PATELLA     SCROTAL EXPLORATION N/A 01/07/2022   Procedure: DEBRIDEMENT OF PENIS, SCROTUM, TESTICLES, AND GROIN;  Surgeon: Lovie Arlyss LITTIE, MD;  Location: [facility] OR;  Service: Urology;  Laterality: N/A;   THORACIC DISCECTOMY N/A 07/03/2019   Procedure: Evacuation of Thoracic Hematoma;  Surgeon: Lanis Pupa, MD;  Location: [facility] OR;  Service: Neurosurgery;  Laterality: N/A;   WOUND DEBRIDEMENT N/A 01/07/2022   Procedure: DEBRIDEMENT OF GROIN AND ABDOMINAL WALL;  Surgeon: Dasie Leonor LITTIE, MD;  Location: [facility] OR;  Service: General;  Laterality: N/A;   Patient Active Problem List   Diagnosis Date Noted   Acute delirium 01/18/2022   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   Malnutrition of moderate degree 04/25/2021   Liver lesion    Urinary tract infection with hematuria    Lower GI bleed 04/24/2021   Anemia 04/24/2021   Essential hypertension 04/24/2021   Diabetes mellitus type 2 in nonobese (HCC) 04/24/2021   Acute GI bleeding 04/24/2021   Erectile dysfunction due to diseases classified elsewhere 03/02/2020   Wheelchair dependence 11/27/2019   Spasticity 08/03/2019   Paraplegia (HCC) 07/07/2019   Neurogenic bowel 07/07/2019   Neurogenic bladder 07/07/2019   Thoracic myelopathy 06/30/2019    PCP: Kenney Clotilda PARAS, PA  REFERRING PROVIDER: Kenney Clotilda PARAS, PA  REFERRING DIAG: M62.81 (ICD-10-CM) - Muscle weakness (generalized)  THERAPY DIAG:  Muscle weakness (generalized)  Other abnormalities of gait and mobility  Other symptoms and signs involving the musculoskeletal system  Decreased strength, endurance, and mobility  Rationale for Evaluation and Treatment: Rehabilitation  ONSET DATE: Chronic   SUBJECTIVE:   SUBJECTIVE STATEMENT: No changes.  I can walk down the steps   Initial Subjective Reports he wants to try and see if he can improve his mobility and get out of therapy with some type of walking on his own. Right leg is a lot weaker  than his left. Uses walker at home and walks about every other day and also doing some leg exercises. Currently does not have a gym membership.   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 No  PRECAUTIONS: None  WEIGHT BEARING RESTRICTIONS: No  FALLS:  Has patient fallen in last 6 months? No  LIVING ENVIRONMENT: Lives with: lives with their family and lives with their spouse Lives in: House/apartment Stairs: No Has following equipment at home: Environmental Consultant - 2 wheeled  OCCUPATION: Retired   PLOF: Requires assistive device for independence, Needs assistance with ADLs, and Needs assistance with gait  PATIENT GOALS: Get back into walking on his own   OBJECTIVE: (objective measures from initial evaluation unless otherwise dated)  PATIENT SURVEYS:  LEFS  Extreme difficulty/unable (0), Quite a bit of difficulty (1), Moderate difficulty (2), Little difficulty (3), No difficulty (4) Survey date:  Eval  Any of your usual work, housework or school activities 4  2. Usual hobbies, recreational or sporting activities 4  3. Getting into/out of the bath 3  4. Walking between rooms 4  5. Putting on socks/shoes 4  6. Squatting  2  7. Lifting an object, like a bag of groceries from the floor 3  8. Performing light activities around your home 4  9. Performing heavy activities around your home 3  10. Getting into/out of a car 4  11. Walking 2 blocks 2  12. Walking 1 mile 0  13. Going up/down 10 stairs (1 flight) 2  14. Standing for 1 hour 0  15.  sitting for 1 hour 4  16. Running on even ground 0  17. Running on uneven ground 0  18. Making sharp turns while running fast 0  19. Hopping  0  20. Rolling over in bed 4  Score total:  47/80     COGNITION: Overall cognitive status: Within functional limits for tasks assessed     SENSATION: WFL  POSTURE: rounded shoulders and forward head  LOWER  EXTREMITY ROM:  Active ROM Right eval Left eval  Hip flexion    Hip extension    Hip abduction    Hip adduction    Hip internal rotation    Hip external rotation    Knee flexion    Knee extension    Ankle dorsiflexion    Ankle plantarflexion    Ankle inversion    Ankle eversion     (Blank rows = not tested) *= pain/symptoms  LOWER EXTREMITY MMT:  MMT Right eval Left eval  Hip flexion 3 3  Hip extension 2+ 2+  Hip abduction 2+ 2+  Hip adduction    Hip internal rotation    Hip external rotation    Knee flexion 2+ 5  Knee extension 2+ 5  Ankle dorsiflexion 3 3  Ankle plantarflexion    Ankle inversion    Ankle eversion     (Blank rows = not tested) *= pain/symptoms  FUNCTIONAL  TESTS:  2 minute walk test: 72 feet using 2W walker   GAIT: Distance walked: 72 ft Assistive device utilized: Walker - 2 wheeled Level of assistance: CGA Comments: wide bos, RLE rotated outward, decreased hip flexion, decreased heel strike    TODAY'S TREATMENT:                                                                                                                              DATE:    [facility] Adult PT Treatment:                                                DATE: 01/31/24 Pt seen for aquatic therapy today.  Treatment took place in water 3.5-4.75 ft in depth at the [facility] pool. Temp of water was 91.  Pt entered the pool via stair using step pattern and heavy ue support with hand rail exit via lift for safety  *Intro to setting *seated on lift: cycling *side stepping R/L ue support wall *Ue support on wall: toe raises; heel raises; hip add/abd; hip extension; relaxed squats (yellow noodle wrapped anteriorly across chest).  Rest periods in between as needed *froward amb along wall 4.0 ft through 4.8 ue support  barbell   Pt requires the buoyancy and hydrostatic pressure of water for support, and to offload joints by unweighting joint load by at least 50 % in navel deep  water and by at least 75-80% in chest to neck deep water.  Viscosity of the water is needed for resistance of strengthening. Water current perturbations provides challenge to standing balance requiring increased core activation.     01/23/24 Evaluation  Standing marches with 2-wheel walker x10 Standing hip extension with 2-wheel walker x10  PATIENT EDUCATION:  Education details: Patient educated on exam findings, POC, scope of PT, HEP, relevant anatomy and biomechanics, and activity tolerance. Person educated: Patient Education method: Explanation, Demonstration, and Handouts Education comprehension: verbalized understanding, returned demonstration, verbal cues required, and tactile cues required  HOME EXERCISE PROGRAM: Access Code: [email or web address] URL: [email or web address] Date: 01/23/2024 Prepared by: Lili Finder  Exercises - Standing March with Counter Support  - 1 x daily - 7 x weekly - 3 sets - 10 reps - Standing Hip Extension with Unilateral Counter Support  - 1 x daily - 7 x weekly - 3 sets - 10 reps  ASSESSMENT:  CLINICAL IMPRESSION: Pt arrives late in [facility] unassisted.  At pts request he negotiates stair into pool with CGA heavy use of UE. He exits via lift for safety. Pt is directed through various movement patterns and trials in both sitting and standing positions.   He is provided VC, demonstration and cga throughout session for execution of exercises while monitoring toleration.He will need assistance in pool for safety and to advance challenges.  Goals are ongoing.  Initial Impression Patient a 73 y.o. y.o. male who was seen today for physical therapy evaluation and treatment for general weakness. Patient presents with pain limited deficits in lower extremity strength, ROM, endurance, activity tolerance, and functional mobility with ADL. Patient is having to modify and restrict ADL as indicated by outcome measure score as well as subjective information and  objective measures which is affecting overall participation. Patient will benefit from skilled physical therapy in order to improve function and reduce impairment.  OBJECTIVE IMPAIRMENTS: Abnormal gait, cardiopulmonary status limiting activity, decreased activity tolerance, decreased balance, decreased endurance, decreased mobility, difficulty walking, decreased ROM, decreased strength, increased muscle spasms, impaired flexibility, improper body mechanics, and pain  ACTIVITY LIMITATIONS: carrying, lifting, bending, standing, squatting, stairs, transfers, bed mobility, locomotion level, and caring for others  PARTICIPATION LIMITATIONS: meal prep, cleaning, laundry, shopping, community activity, occupation, and yard work  PERSONAL FACTORS: Past/current experiences, Time since onset of injury/illness/exacerbation, and 3+ comorbidities: DM, HTN, T8 ASIA D, paraplegia, neurogenic bowel and bladder, 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   REHAB POTENTIAL: Good  CLINICAL DECISION MAKING: Evolving/moderate complexity  EVALUATION COMPLEXITY: Moderate   GOALS: Goals reviewed with patient? Yes  SHORT TERM GOALS: Target date: 02/06/2024  Patient will be independent with HEP in order to improve functional outcomes. Baseline: Goal status: INITIAL  2.  Patient will report at least 25% improvement in symptoms for improved quality of life. Baseline: Goal status: INITIAL  LONG TERM GOALS: Target date: 04/02/2024  Patient will report at least 75% improvement in symptoms for improved quality of life. Baseline:  Goal status: INITIAL  2.  Patient will improve LEFS score by at least 9 points in order to indicate improved tolerance to activity. Baseline:  Goal status: INITIAL  3.  Patient will improve to 100 feet with LRAD to indicate improvements in activity tolerance and overall functional mobility. . Baseline:  Goal status: INITIAL  4.  Patient will  demonstrate grade at least 4/5 MMT grade in all tested musculature as evidence of improved strength to assist with stair ambulation and gait.   Baseline:  Goal status: INITIAL   PLAN:  PT FREQUENCY: 1-2x/week  PT DURATION: 10 weeks  PLANNED INTERVENTIONS: 97164- PT Re-evaluation, 97110-Therapeutic exercises, 97530- Therapeutic activity, [id number]- Neuromuscular re-education, 97535- Self Care, 02859- Manual therapy, [id number]- Gait training, [phone]- Orthotic Fit/training, [phone]- Canalith repositioning, [id number]- Aquatic Therapy, [phone]- Splinting, [phone]- Wound care (first 20 sq cm), 97598- Wound care (each additional 20 sq cm)Patient/Family education, Balance training, Stair training, Taping, Dry Needling, Joint mobilization, Joint manipulation, Spinal manipulation, Spinal mobilization, Scar mobilization, and DME instructions.  PLAN FOR NEXT SESSION: plan to progress as tolerated, continue LE strengthening, continue gait training, update HEP   Ronal Foots) Nilza Eaker MPT 01/31/2024 12:45 PM [facility] [address] [place], [place], [address] Phone: [phone]   Fax:  [phone]

## 2024-02-17 ENCOUNTER — Ambulatory Visit (HOSPITAL_BASED_OUTPATIENT_CLINIC_OR_DEPARTMENT_OTHER)

## 2024-02-17 ENCOUNTER — Encounter (HOSPITAL_BASED_OUTPATIENT_CLINIC_OR_DEPARTMENT_OTHER): Payer: Self-pay

## 2024-02-17 DIAGNOSIS — M6281 Muscle weakness (generalized): Secondary | ICD-10-CM | POA: Diagnosis not present

## 2024-02-17 DIAGNOSIS — R2689 Other abnormalities of gait and mobility: Secondary | ICD-10-CM

## 2024-02-17 DIAGNOSIS — R29898 Other symptoms and signs involving the musculoskeletal system: Secondary | ICD-10-CM

## 2024-02-17 DIAGNOSIS — R6889 Other general symptoms and signs: Secondary | ICD-10-CM

## 2024-02-17 NOTE — Therapy (Signed)
 " OUTPATIENT PHYSICAL THERAPY LOWER EXTREMITY TREATMENT   Patient Name: Timothy Davenport MRN: [id number] DOB:[birth year], 73 y.o., male Today's Date: 02/17/2024  END OF SESSION:  PT End of Session - 02/17/24 1416     Visit Number 3    Number of Visits 20    Date for Recertification  04/02/24    Authorization Type [place]    Authorization Time Period 11/26/23 - 03/25/24    Authorization - Visit Number 2    Authorization - Number of Visits 15    Progress Note Due on Visit 10    PT Start Time 1349    PT Stop Time 1430    PT Time Calculation (min) 41 min    Activity Tolerance Patient tolerated treatment well    Behavior During Therapy [facility] for tasks assessed/performed             Past Medical History:  Diagnosis Date   Colon polyp    Diabetes mellitus without complication (HCC)    Diabetic neuropathy (HCC)    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   HTN (hypertension)    Patella fracture    Renal calculi    Past Surgical History:  Procedure Laterality Date   DEBRIDEMENT AND CLOSURE WOUND N/A 01/19/2022   Procedure: Wound debridement, wound closure, possible wound vac, Myriad placement;  Surgeon: Waddell Leonce NOVAK, MD;  Location: [facility] OR;  Service: Plastics;  Laterality: N/A;   I & D EXTREMITY N/A 01/09/2022   Procedure: EXCISIONAL DEBRIDEMENT BILATERAL GROIN AND LOWER ABDOMEN;  Surgeon: Vanderbilt Ned, MD;  Location: [facility] OR;  Service: General;  Laterality: N/A;   IRRIGATION AND DEBRIDEMENT ABSCESS N/A 01/05/2022   Procedure: IRRIGATION AND DEBRIDEMENT NECROTIZING  TISSUE RECTAL ABSCESS;  Surgeon: Lovie Arlyss LITTIE, MD;  Location: [facility] OR;  Service: Urology;  Laterality: N/A;   LUMBAR LAMINECTOMY/DECOMPRESSION MICRODISCECTOMY N/A 07/01/2019   Procedure: LUMBAR LAMINECTOMY/DECOMPRESSION MICRODISCECTOMY 1 LEVEL, THORACIC SIX-SEVEN;  Surgeon: Lanis Pupa, MD;  Location: [facility] OR;  Service: Neurosurgery;  Laterality: N/A;  LUMBAR LAMINECTOMY/DECOMPRESSION MICRODISCECTOMY 1 LEVEL,  THORACIC SIX-SEVEN    ORIF PATELLA     SCROTAL EXPLORATION N/A 01/07/2022   Procedure: DEBRIDEMENT OF PENIS, SCROTUM, TESTICLES, AND GROIN;  Surgeon: Lovie Arlyss LITTIE, MD;  Location: [facility] OR;  Service: Urology;  Laterality: N/A;   THORACIC DISCECTOMY N/A 07/03/2019   Procedure: Evacuation of Thoracic Hematoma;  Surgeon: Lanis Pupa, MD;  Location: [facility] OR;  Service: Neurosurgery;  Laterality: N/A;   WOUND DEBRIDEMENT N/A 01/07/2022   Procedure: DEBRIDEMENT OF GROIN AND ABDOMINAL WALL;  Surgeon: Dasie Leonor LITTIE, MD;  Location: [facility] OR;  Service: General;  Laterality: N/A;   Patient Active Problem List   Diagnosis Date Noted   Acute delirium 01/18/2022   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   Malnutrition of moderate degree 04/25/2021   Liver lesion    Urinary tract infection with hematuria    Lower GI bleed 04/24/2021   Anemia 04/24/2021   Essential hypertension 04/24/2021   Diabetes mellitus type 2 in nonobese (HCC) 04/24/2021   Acute GI bleeding 04/24/2021   Erectile dysfunction due to diseases classified elsewhere 03/02/2020   Wheelchair dependence 11/27/2019   Spasticity 08/03/2019   Paraplegia (HCC) 07/07/2019   Neurogenic bowel 07/07/2019   Neurogenic bladder 07/07/2019   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    Initial Subjective Reports he wants to try and see if he can improve his mobility and get out of therapy with some type of walking on his own. Right leg is a lot  weaker than his left. Uses walker at home and walks about every other day and also doing some leg exercises. Currently does not have a gym membership.   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 No  PRECAUTIONS: None  WEIGHT BEARING RESTRICTIONS: No  FALLS:  Has patient fallen in last 6 months? No  LIVING ENVIRONMENT: Lives with: lives with their family and lives with their spouse Lives in: House/apartment Stairs: No Has following equipment at home: Environmental Consultant - 2 wheeled  OCCUPATION: Retired   PLOF: Requires assistive device for independence, Needs assistance with ADLs, and Needs assistance with gait  PATIENT GOALS: Get back into walking on his own   OBJECTIVE: (objective measures from initial evaluation unless otherwise dated)  PATIENT SURVEYS:  LEFS  Extreme difficulty/unable (0), Quite a bit of difficulty (1), Moderate difficulty (2), Little difficulty (3), No difficulty (4) Survey date:  Eval  Any of your usual work, housework or school activities 4  2. Usual hobbies, recreational or sporting activities 4  3. Getting into/out of the bath 3  4. Walking between rooms 4  5. Putting on socks/shoes 4  6. Squatting  2  7. Lifting an object, like a bag of groceries from the floor 3  8. Performing light activities around your home 4  9. Performing heavy activities around your home 3  10. Getting into/out of a car 4  11. Walking 2 blocks 2  12. Walking 1 mile 0  13. Going up/down 10 stairs (1 flight) 2  14. Standing for 1 hour 0  15.  sitting for 1 hour 4  16. Running on even ground 0  17. Running on uneven ground 0  18. Making sharp turns while running fast 0  19. Hopping  0  20. Rolling over in bed 4  Score total:  47/80     COGNITION: Overall cognitive status: Within functional limits for tasks assessed     SENSATION: WFL  POSTURE: rounded shoulders and forward  head  LOWER EXTREMITY ROM:  Active ROM Right eval Left eval  Hip flexion    Hip extension    Hip abduction    Hip adduction    Hip internal rotation    Hip external rotation    Knee flexion    Knee extension    Ankle dorsiflexion    Ankle plantarflexion    Ankle inversion    Ankle eversion     (Blank rows = not tested) *= pain/symptoms  LOWER EXTREMITY MMT:  MMT Right eval Left eval  Hip flexion 3 3  Hip extension 2+ 2+  Hip abduction 2+ 2+  Hip adduction    Hip internal rotation    Hip external rotation    Knee flexion 2+ 5  Knee extension 2+ 5  Ankle dorsiflexion 3 3  Ankle plantarflexion    Ankle inversion    Ankle eversion     (Blank rows = not tested) *=  pain/symptoms  FUNCTIONAL TESTS:  2 minute walk test: 72 feet using 2W walker   GAIT: Distance walked: 72 ft Assistive device utilized: Walker - 2 wheeled Level of assistance: CGA Comments: wide bos, RLE rotated outward, decreased hip flexion, decreased heel strike    TODAY'S TREATMENT:                                                                                                                              DATE:      [facility] Adult PT Treatment:                                                DATE: 02/17/24  Seated march -LAQ -Adductor squeeze x20 STS transfer using FWW x3 -Gait with FFW -Standign with FWW:  Hip abd x10ea  Hip ext  x10ea Seated clam GTB 2x25 Seated HR/TR 2x10ea Standing weight shifts lateral x10 Standing hip thrusts with glute max activation x10     [facility] Adult PT Treatment:                                                DATE: 01/31/24 Pt seen for aquatic therapy today.  Treatment took place in water 3.5-4.75 ft in depth at the [facility] pool. Temp of water was 91.  Pt entered the pool via stair using step pattern and heavy ue support with hand rail exit via lift for safety  *Intro to setting *seated on lift: cycling *side stepping R/L ue support wall *Ue  support on wall: toe raises; heel raises; hip add/abd; hip extension; relaxed squats (yellow noodle wrapped anteriorly across chest).  Rest periods in between as needed *froward amb along wall 4.0 ft through 4.8 ue support  barbell   Pt requires the buoyancy and hydrostatic pressure of water for support, and to offload joints by unweighting joint load by at least 50 % in navel deep water and by at least 75-80% in chest to neck deep water.  Viscosity of the water is needed for resistance of strengthening. Water current perturbations provides challenge to standing balance requiring increased core activation.     01/23/24 Evaluation  Standing marches with 2-wheel walker x10 Standing hip extension with 2-wheel walker x10  PATIENT EDUCATION:  Education details: Patient educated on exam findings, POC, scope of PT, HEP, relevant anatomy and biomechanics, and activity tolerance. Person educated: Patient Education method: Explanation, Demonstration, and Handouts Education comprehension: verbalized understanding, returned demonstration, verbal cues required, and tactile cues required  HOME EXERCISE PROGRAM: Access Code: [email or web address] URL: [email or web address] Date: 01/23/2024 Prepared by: Lili Finder  Exercises - Standing March with Counter Support  - 1 x daily - 7 x weekly -  3 sets - 10 reps - Standing Hip Extension with Unilateral Counter Support  - 1 x daily - 7 x weekly - 3 sets - 10 reps  ASSESSMENT:  CLINICAL IMPRESSION: Pt able to complete STS transfers independently using FWW, though cues are required for trunk and pelvic alignment. Pt has excellent understanding of proper transfer techniques.  L LE with less NMC and activation compared to R LE. He has poor tolerance for standing longer than a minute or 2, so returned to seated position between exercises. PT is planning to transfer pt to neuro clinic, will keep remaining appts until then.    Initial Impression Patient a  73 y.o. y.o. male who was seen today for physical therapy evaluation and treatment for general weakness. Patient presents with pain limited deficits in lower extremity strength, ROM, endurance, activity tolerance, and functional mobility with ADL. Patient is having to modify and restrict ADL as indicated by outcome measure score as well as subjective information and objective measures which is affecting overall participation. Patient will benefit from skilled physical therapy in order to improve function and reduce impairment.  OBJECTIVE IMPAIRMENTS: Abnormal gait, cardiopulmonary status limiting activity, decreased activity tolerance, decreased balance, decreased endurance, decreased mobility, difficulty walking, decreased ROM, decreased strength, increased muscle spasms, impaired flexibility, improper body mechanics, and pain  ACTIVITY LIMITATIONS: carrying, lifting, bending, standing, squatting, stairs, transfers, bed mobility, locomotion level, and caring for others  PARTICIPATION LIMITATIONS: meal prep, cleaning, laundry, shopping, community activity, occupation, and yard work  PERSONAL FACTORS: Past/current experiences, Time since onset of injury/illness/exacerbation, and 3+ comorbidities: DM, HTN, T8 ASIA D, paraplegia, neurogenic bowel and bladder, 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   REHAB POTENTIAL: Good  CLINICAL DECISION MAKING: Evolving/moderate complexity  EVALUATION COMPLEXITY: Moderate   GOALS: Goals reviewed with patient? Yes  SHORT TERM GOALS: Target date: 02/06/2024  Patient will be independent with HEP in order to improve functional outcomes. Baseline: Goal status: MET 12/29  2.  Patient will report at least 25% improvement in symptoms for improved quality of life. Baseline: Goal status: INITIAL  LONG TERM GOALS: Target date: 04/02/2024  Patient will report at least 75% improvement in symptoms for improved quality of  life. Baseline:  Goal status: INITIAL  2.  Patient will improve LEFS score by at least 9 points in order to indicate improved tolerance to activity. Baseline:  Goal status: INITIAL  3.  Patient will improve to 100 feet with LRAD to indicate improvements in activity tolerance and overall functional mobility. . Baseline:  Goal status: INITIAL  4.  Patient will demonstrate grade at least 4/5 MMT grade in all tested musculature as evidence of improved strength to assist with stair ambulation and gait.   Baseline:  Goal status: INITIAL   PLAN:  PT FREQUENCY: 1-2x/week  PT DURATION: 10 weeks  PLANNED INTERVENTIONS: [id number]- PT Re-evaluation, 97110-Therapeutic exercises, 97530- Therapeutic activity, [id number]- Neuromuscular re-education, 97535- Self Care, [id number]- Manual therapy, [id number]- Gait training, [phone]- Orthotic Fit/training, [phone]- Canalith repositioning, [id number]- Aquatic Therapy, [phone]- Splinting, [phone]- Wound care (first 20 sq cm), 97598- Wound care (each additional 20 sq cm)Patient/Family education, Balance training, Stair training, Taping, Dry Needling, Joint mobilization, Joint manipulation, Spinal manipulation, Spinal mobilization, Scar mobilization, and DME instructions.  PLAN FOR NEXT SESSION: plan to progress as tolerated, continue LE strengthening, continue gait training, update HEP   Asberry Rodes, PTA  02/17/2024 2:33 PM [facility] [address] [place], [place], [address] Phone: [phone]  Fax:  [phone]    "

## 2024-02-24 ENCOUNTER — Ambulatory Visit (HOSPITAL_BASED_OUTPATIENT_CLINIC_OR_DEPARTMENT_OTHER)

## 2024-02-27 ENCOUNTER — Ambulatory Visit: Attending: Physician Assistant | Admitting: Physical Therapy

## 2024-02-27 ENCOUNTER — Encounter: Payer: Self-pay | Admitting: Physical Therapy

## 2024-02-27 DIAGNOSIS — M6281 Muscle weakness (generalized): Secondary | ICD-10-CM | POA: Insufficient documentation

## 2024-02-27 DIAGNOSIS — R29898 Other symptoms and signs involving the musculoskeletal system: Secondary | ICD-10-CM | POA: Insufficient documentation

## 2024-02-27 DIAGNOSIS — Z7409 Other reduced mobility: Secondary | ICD-10-CM | POA: Diagnosis present

## 2024-02-27 DIAGNOSIS — R6889 Other general symptoms and signs: Secondary | ICD-10-CM | POA: Diagnosis present

## 2024-02-27 DIAGNOSIS — R531 Weakness: Secondary | ICD-10-CM | POA: Diagnosis present

## 2024-02-27 DIAGNOSIS — R2689 Other abnormalities of gait and mobility: Secondary | ICD-10-CM | POA: Insufficient documentation

## 2024-02-27 NOTE — Patient Instructions (Signed)
 Access Code: [email or web address] URL: [email or web address] Date: 02/27/2024 Prepared by: Daved Bull  Exercises - Standing March with Counter Support  - 1 x daily - 7 x weekly - 3 sets - 10 reps - Standing Hip Extension with Unilateral Counter Support  - 1 x daily - 7 x weekly - 3 sets - 10 reps - Seated Long Arc Quad  - 1 x daily - 7 x weekly - 3 sets - 10 reps - Seated Hamstring Stretch  - 1 x daily - 7 x weekly - 1 sets - 3 reps - 30 seconds hold - Staggered Sit-to-Stand  - 1 x daily - 7 x weekly - 2-3 sets - 5 reps - Seated Hip Abduction  - 1 x daily - 7 x weekly - 2 sets - 10 reps - Side to Side Weight Shift with Counter Support  - 1 x daily - 7 x weekly - 2 sets - 10 reps - Staggered Stance Forward Backward Weight Shift with Counter Support  - 1 x daily - 7 x weekly - 2 sets - 10 reps

## 2024-02-27 NOTE — Therapy (Signed)
 " OUTPATIENT PHYSICAL THERAPY LOWER EXTREMITY TREATMENT   Patient Name: Timothy Davenport MRN: [id number] DOB:[birth year], 74 y.o., male Today's Date: 02/27/2024  END OF SESSION:  PT End of Session - 02/27/24 1454     Visit Number 4    Number of Visits 20    Date for Recertification  04/02/24    Authorization Type [facility]    Authorization Time Period 11/26/23 - 03/25/24    Authorization - Visit Number 3    Authorization - Number of Visits 15    Progress Note Due on Visit 10    PT Start Time 1450    PT Stop Time 1532    PT Time Calculation (min) 42 min    Equipment Utilized During Treatment Gait belt    Activity Tolerance Patient tolerated treatment well    Behavior During Therapy WFL for tasks assessed/performed             Past Medical History:  Diagnosis Date   Colon polyp    Diabetes mellitus without complication (HCC)    Diabetic neuropathy (HCC)    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   HTN (hypertension)    Patella fracture    Renal calculi    Past Surgical History:  Procedure Laterality Date   DEBRIDEMENT AND CLOSURE WOUND N/A 01/19/2022   Procedure: Wound debridement, wound closure, possible wound vac, Myriad placement;  Surgeon: Waddell Leonce NOVAK, MD;  Location: [facility] OR;  Service: Plastics;  Laterality: N/A;   I & D EXTREMITY N/A 01/09/2022   Procedure: EXCISIONAL DEBRIDEMENT BILATERAL GROIN AND LOWER ABDOMEN;  Surgeon: Vanderbilt Ned, MD;  Location: [facility] OR;  Service: General;  Laterality: N/A;   IRRIGATION AND DEBRIDEMENT ABSCESS N/A 01/05/2022   Procedure: IRRIGATION AND DEBRIDEMENT NECROTIZING  TISSUE RECTAL ABSCESS;  Surgeon: Lovie Arlyss LITTIE, MD;  Location: [facility] OR;  Service: Urology;  Laterality: N/A;   LUMBAR LAMINECTOMY/DECOMPRESSION MICRODISCECTOMY N/A 07/01/2019   Procedure: LUMBAR LAMINECTOMY/DECOMPRESSION MICRODISCECTOMY 1 LEVEL, THORACIC SIX-SEVEN;  Surgeon: Lanis Pupa, MD;  Location: [facility] OR;  Service: Neurosurgery;  Laterality: N/A;  LUMBAR  LAMINECTOMY/DECOMPRESSION MICRODISCECTOMY 1 LEVEL, THORACIC SIX-SEVEN    ORIF PATELLA     SCROTAL EXPLORATION N/A 01/07/2022   Procedure: DEBRIDEMENT OF PENIS, SCROTUM, TESTICLES, AND GROIN;  Surgeon: Lovie Arlyss LITTIE, MD;  Location: [facility] OR;  Service: Urology;  Laterality: N/A;   THORACIC DISCECTOMY N/A 07/03/2019   Procedure: Evacuation of Thoracic Hematoma;  Surgeon: Lanis Pupa, MD;  Location: [facility] OR;  Service: Neurosurgery;  Laterality: N/A;   WOUND DEBRIDEMENT N/A 01/07/2022   Procedure: DEBRIDEMENT OF GROIN AND ABDOMINAL WALL;  Surgeon: Dasie Leonor LITTIE, MD;  Location: [facility] OR;  Service: General;  Laterality: N/A;   Patient Active Problem List   Diagnosis Date Noted   Acute delirium 01/18/2022   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   Malnutrition of moderate degree 04/25/2021   Liver lesion    Urinary tract infection with hematuria    Lower GI bleed 04/24/2021   Anemia 04/24/2021   Essential hypertension 04/24/2021   Diabetes mellitus type 2 in nonobese (HCC) 04/24/2021   Acute GI bleeding 04/24/2021   Erectile dysfunction due to diseases classified elsewhere 03/02/2020   Wheelchair dependence 11/27/2019   Spasticity 08/03/2019   Paraplegia (HCC) 07/07/2019   Neurogenic bowel 07/07/2019   Neurogenic bladder 07/07/2019   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  He reports needing to reschedule his visits.  He would like to do water aerobics but is not a member of  [employer].  No falls or other changes.  He presents in manual wheelchair.   Initial Subjective Reports he wants to try and see if he can improve his mobility and get out of therapy with some type of walking on his own. Right leg is a lot weaker than his left. Uses walker at home and walks about every other day and also doing some leg exercises. Currently does not have a gym membership.   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 No  PRECAUTIONS: None  WEIGHT BEARING RESTRICTIONS: No  FALLS:  Has patient fallen in last 6 months? No  LIVING ENVIRONMENT: Lives with: lives with their family and lives with their spouse Lives in: House/apartment Stairs: No Has following equipment at home: Environmental Consultant - 2 wheeled  OCCUPATION: Retired   PLOF: Requires assistive device for independence, Needs assistance with ADLs, and Needs assistance with gait  PATIENT GOALS: Get back into walking on his own   OBJECTIVE: (objective measures from initial evaluation unless otherwise dated)  PATIENT SURVEYS:  LEFS  Extreme difficulty/unable (0), Quite a bit of difficulty (1), Moderate difficulty (2), Little difficulty (3), No difficulty (4) Survey date:  Eval  Any of your usual work, housework or school activities 4  2. Usual hobbies, recreational or sporting activities 4  3. Getting into/out of the bath 3  4. Walking between rooms 4  5. Putting on socks/shoes 4  6. Squatting  2  7. Lifting an object, like a bag of groceries from the floor 3  8. Performing light activities around your home 4  9. Performing heavy activities around your home 3  10. Getting into/out of a car 4  11. Walking 2 blocks 2  12. Walking 1 mile 0  13. Going up/down 10 stairs (1 flight) 2  14. Standing for 1 hour 0  15.  sitting for 1 hour 4  16. Running on even ground 0  17. Running on uneven ground 0  18. Making sharp turns while  running fast 0  19. Hopping  0  20. Rolling over in bed 4  Score total:  47/80     COGNITION: Overall cognitive status: Within functional limits for tasks assessed     SENSATION: WFL  POSTURE: rounded shoulders and forward head  LOWER EXTREMITY ROM:  Active ROM Right eval Left eval  Hip flexion    Hip extension    Hip abduction    Hip adduction    Hip internal rotation    Hip external rotation    Knee flexion    Knee extension    Ankle dorsiflexion    Ankle plantarflexion    Ankle inversion    Ankle eversion     (Blank rows = not tested) *= pain/symptoms  LOWER EXTREMITY MMT:  MMT Right eval Left eval  Hip flexion 3 3  Hip extension 2+ 2+  Hip abduction 2+ 2+  Hip adduction    Hip internal rotation  Hip external rotation    Knee flexion 2+ 5  Knee extension 2+ 5  Ankle dorsiflexion 3 3  Ankle plantarflexion    Ankle inversion    Ankle eversion     (Blank rows = not tested) *= pain/symptoms  FUNCTIONAL TESTS:  2 minute walk test: 72 feet using 2W walker   GAIT: Distance walked: 72 ft Assistive device utilized: Environmental Consultant - 2 wheeled Level of assistance: CGA Comments: wide bos, RLE rotated outward, decreased hip flexion, decreased heel strike    TODAY'S TREATMENT:                                                                                                                              DATE:  02/27/2024  GAIT: Gait pattern: step to pattern, decreased ankle dorsiflexion- Right, knee flexed in stance- Right, shuffling, trunk flexed, narrow BOS, poor foot clearance- Right, and poor foot clearance- Left Distance walked: 115' Assistive device utilized: Environmental Consultant - 2 wheeled Level of assistance: CGA and Min A Comments: Increased UE reliance on 2WW as distance increases.  He has increased RLE flexion and shortened stride as fatigue sets in with distance.  -R staggered STS x5 w/ BUE support -Passive hamstring stretch > active hamstring stretch in sitting 3x30  sec RLE -LAQ 2x10 (pt unable to fully extend) - edu on setup for foot clearance using hospital bed elevation -Seated clamshell 2x10 > discussed side-lying progression -Standing lateral weight shifts at sink to fatigue > staggered anterior/posterior weight shifts at sink to fatigue  PATIENT EDUCATION:  Education details: Explained aquatic therapy vs water aerobics and how to obtain [employer] if interested in trying aerobics (unsure of functional tolerance and pt edu on this).  Discussed toe cap and AFO as it relates to gait efficiency and safety - pt has both but rarely if ever uses them (encouraged to wear toe cap shoes for therapy - pt agreeable).  Additions to HEP (standing balance and STS at sink and use supervision from aide in morning as able until comfortable with standing aspects). Person educated: Patient Education method: Explanation, Demonstration, and Handouts Education comprehension: verbalized understanding, returned demonstration, verbal cues required, and tactile cues required  HOME EXERCISE PROGRAM: Access Code: [email or web address] URL: [email or web address] Date: 01/23/2024 Prepared by: Lili Finder  Exercises - Standing March with Counter Support  - 1 x daily - 7 x weekly - 3 sets - 10 reps - Standing Hip Extension with Unilateral Counter Support  - 1 x daily - 7 x weekly - 3 sets - 10 reps - Seated Long Arc Quad  - 1 x daily - 7 x weekly - 3 sets - 10 reps - Seated Hamstring Stretch  - 1 x daily - 7 x weekly - 1 sets - 3 reps - 30 seconds hold - Staggered Sit-to-Stand  - 1 x daily - 7 x weekly - 2-3 sets - 5 reps - Seated Hip Abduction  - 1 x daily - 7  x weekly - 2 sets - 10 reps - Side to Side Weight Shift with Counter Support  - 1 x daily - 7 x weekly - 2 sets - 10 reps - Staggered Stance Forward Backward Weight Shift with Counter Support  - 1 x daily - 7 x weekly - 2 sets - 10 reps  ASSESSMENT:  CLINICAL IMPRESSION: PT to change appts to this facility  to complete therapy.  He demonstrates flexion of RLE in stance today with short distance ambulation and gait tolerance may be improved by wearing prior toe cap and AFO combo.  Expanded his HEP to address primarily RLE weakness and standing balance and endurance with simple weight shifts.  He fatigues quickly with each task following initial gait so would benefit from heavy endurance work both on land and in [place].  He is considering water aerobics but does not have membership to pool and safety concerns were discussed today.  Will continue per POC.   Initial Impression Patient a 74 y.o. y.o. male who was seen today for physical therapy evaluation and treatment for general weakness. Patient presents with pain limited deficits in lower extremity strength, ROM, endurance, activity tolerance, and functional mobility with ADL. Patient is having to modify and restrict ADL as indicated by outcome measure score as well as subjective information and objective measures which is affecting overall participation. Patient will benefit from skilled physical therapy in order to improve function and reduce impairment.  OBJECTIVE IMPAIRMENTS: Abnormal gait, cardiopulmonary status limiting activity, decreased activity tolerance, decreased balance, decreased endurance, decreased mobility, difficulty walking, decreased ROM, decreased strength, increased muscle spasms, impaired flexibility, improper body mechanics, and pain  ACTIVITY LIMITATIONS: carrying, lifting, bending, standing, squatting, stairs, transfers, bed mobility, locomotion level, and caring for others  PARTICIPATION LIMITATIONS: meal prep, cleaning, laundry, shopping, community activity, occupation, and yard work  PERSONAL FACTORS: Past/current experiences, Time since onset of injury/illness/exacerbation, and 3+ comorbidities: DM, HTN, T8 ASIA D, paraplegia, neurogenic bowel and bladder, 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    REHAB POTENTIAL: Good  CLINICAL DECISION MAKING: Evolving/moderate complexity  EVALUATION COMPLEXITY: Moderate   GOALS: Goals reviewed with patient? Yes  SHORT TERM GOALS: Target date: 02/06/2024  Patient will be independent with HEP in order to improve functional outcomes. Baseline: Goal status: MET 12/29  2.  Patient will report at least 25% improvement in symptoms for improved quality of life. Baseline: Goal status: INITIAL  LONG TERM GOALS: Target date: 04/02/2024  Patient will report at least 75% improvement in symptoms for improved quality of life. Baseline:  Goal status: INITIAL  2.  Patient will improve LEFS score by at least 9 points in order to indicate improved tolerance to activity. Baseline:  Goal status: INITIAL  3.  Patient will improve to 100 feet with LRAD to indicate improvements in activity tolerance and overall functional mobility. . Baseline:  Goal status: INITIAL  4.  Patient will demonstrate grade at least 4/5 MMT grade in all tested musculature as evidence of improved strength to assist with stair ambulation and gait.   Baseline:  Goal status: INITIAL   PLAN:  PT FREQUENCY: 1-2x/week  PT DURATION: 10 weeks  PLANNED INTERVENTIONS: [id number]- PT Re-evaluation, 97110-Therapeutic exercises, 97530- Therapeutic activity, [id number]- Neuromuscular re-education, 97535- Self Care, [id number]- Manual therapy, [id number]- Gait training, [phone]- Orthotic Fit/training, [phone]- Canalith repositioning, [id number]- Aquatic Therapy, [id number]- Splinting, [id number]- Wound care (first 20 sq cm), 02401- Wound care (each additional 20 sq cm)Patient/Family education, Balance training, Stair training,  Taping, Dry Needling, Joint mobilization, Joint manipulation, Spinal manipulation, Spinal mobilization, Scar mobilization, and DME instructions.  PLAN FOR NEXT SESSION: plan to progress as tolerated, continue LE strengthening, continue gait training, update HEP, RLE NMR - did he bring shoes w/ toe  cap or AFO?  STS, supported squats, terminal knee extension, supine therex for flexibility and strength, [employer]  Pool:  gait training w/ water walker, standing balance, core work, RLE NMR, general strength/STS/squats/step ups - he may need chair lift   Daved Bull, PT, DPT     "

## 2024-03-04 ENCOUNTER — Ambulatory Visit (HOSPITAL_BASED_OUTPATIENT_CLINIC_OR_DEPARTMENT_OTHER): Admitting: Physical Therapy

## 2024-03-05 ENCOUNTER — Ambulatory Visit: Admitting: Physical Therapy

## 2024-03-06 ENCOUNTER — Encounter (HOSPITAL_BASED_OUTPATIENT_CLINIC_OR_DEPARTMENT_OTHER): Admitting: Physical Therapy

## 2024-03-11 ENCOUNTER — Encounter: Payer: Self-pay | Admitting: Physical Therapy

## 2024-03-11 ENCOUNTER — Ambulatory Visit: Admitting: Physical Therapy

## 2024-03-11 ENCOUNTER — Ambulatory Visit (HOSPITAL_BASED_OUTPATIENT_CLINIC_OR_DEPARTMENT_OTHER): Admitting: Physical Therapy

## 2024-03-11 DIAGNOSIS — R2689 Other abnormalities of gait and mobility: Secondary | ICD-10-CM

## 2024-03-11 DIAGNOSIS — M6281 Muscle weakness (generalized): Secondary | ICD-10-CM

## 2024-03-11 DIAGNOSIS — R6889 Other general symptoms and signs: Secondary | ICD-10-CM

## 2024-03-11 DIAGNOSIS — R29898 Other symptoms and signs involving the musculoskeletal system: Secondary | ICD-10-CM

## 2024-03-11 NOTE — Patient Instructions (Signed)
 Access Code: [email or web address] URL: [email or web address] Date: 03/11/2024 Prepared by: Daved Bull  Exercises - Supine Ankle Dorsiflexion Stretch with Caregiver  - 1 x daily - 7 x weekly - 1 sets - 2-3 reps - 20 seconds hold - Supine Hip and Knee Flexion PROM with Caregiver  - 1 x daily - 7 x weekly - 1 sets - 10 reps - Hip Internal and External Rotation Caregiver PROM  - 1 x daily - 7 x weekly - 1 sets - 5 reps - Supine Hamstring Stretch with Caregiver  - 1 x daily - 7 x weekly - 1 sets - 2-3 reps - 30 seconds hold - Supine Single Knee to Chest Stretch  - 1 x daily - 7 x weekly - 1 sets - 2-3 reps - 20 seconds hold

## 2024-03-11 NOTE — Therapy (Signed)
 " OUTPATIENT PHYSICAL THERAPY LOWER EXTREMITY TREATMENT   Patient Name: Timothy Davenport MRN: [id number] DOB:10-16-1950, 74 y.o., male Today's Date: 03/11/2024  END OF SESSION:  PT End of Session - 03/11/24 0943     Visit Number 5    Number of Visits 20    Date for Recertification  04/02/24    Authorization Type [facility]    Authorization Time Period 11/26/23 - 03/25/24    Authorization - Visit Number 4    Authorization - Number of Visits 15    Progress Note Due on Visit 10    PT Start Time 0932    PT Stop Time 1017    PT Time Calculation (min) 45 min    Equipment Utilized During Treatment Gait belt    Activity Tolerance Patient tolerated treatment well    Behavior During Therapy WFL for tasks assessed/performed             Past Medical History:  Diagnosis Date   Colon polyp    Diabetes mellitus without complication (HCC)    Diabetic neuropathy (HCC)    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   HTN (hypertension)    Patella fracture    Renal calculi    Past Surgical History:  Procedure Laterality Date   DEBRIDEMENT AND CLOSURE WOUND N/A 01/19/2022   Procedure: Wound debridement, wound closure, possible wound vac, Myriad placement;  Surgeon: Waddell Leonce NOVAK, MD;  Location: [facility] OR;  Service: Plastics;  Laterality: N/A;   I & D EXTREMITY N/A 01/09/2022   Procedure: EXCISIONAL DEBRIDEMENT BILATERAL GROIN AND LOWER ABDOMEN;  Surgeon: Vanderbilt Ned, MD;  Location: [facility] OR;  Service: General;  Laterality: N/A;   IRRIGATION AND DEBRIDEMENT ABSCESS N/A 01/05/2022   Procedure: IRRIGATION AND DEBRIDEMENT NECROTIZING  TISSUE RECTAL ABSCESS;  Surgeon: Lovie Arlyss LITTIE, MD;  Location: [facility] OR;  Service: Urology;  Laterality: N/A;   LUMBAR LAMINECTOMY/DECOMPRESSION MICRODISCECTOMY N/A 07/01/2019   Procedure: LUMBAR LAMINECTOMY/DECOMPRESSION MICRODISCECTOMY 1 LEVEL, THORACIC SIX-SEVEN;  Surgeon: Lanis Pupa, MD;  Location: [facility] OR;  Service: Neurosurgery;  Laterality: N/A;  LUMBAR  LAMINECTOMY/DECOMPRESSION MICRODISCECTOMY 1 LEVEL, THORACIC SIX-SEVEN    ORIF PATELLA     SCROTAL EXPLORATION N/A 01/07/2022   Procedure: DEBRIDEMENT OF PENIS, SCROTUM, TESTICLES, AND GROIN;  Surgeon: Lovie Arlyss LITTIE, MD;  Location: [facility] OR;  Service: Urology;  Laterality: N/A;   THORACIC DISCECTOMY N/A 07/03/2019   Procedure: Evacuation of Thoracic Hematoma;  Surgeon: Lanis Pupa, MD;  Location: [facility] OR;  Service: Neurosurgery;  Laterality: N/A;   WOUND DEBRIDEMENT N/A 01/07/2022   Procedure: DEBRIDEMENT OF GROIN AND ABDOMINAL WALL;  Surgeon: Dasie Leonor LITTIE, MD;  Location: [facility] OR;  Service: General;  Laterality: N/A;   Patient Active Problem List   Diagnosis Date Noted   Acute delirium 01/18/2022   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   Malnutrition of moderate degree 04/25/2021   Liver lesion    Urinary tract infection with hematuria    Lower GI bleed 04/24/2021   Anemia 04/24/2021   Essential hypertension 04/24/2021   Diabetes mellitus type 2 in nonobese (HCC) 04/24/2021   Acute GI bleeding 04/24/2021   Erectile dysfunction due to diseases classified elsewhere 03/02/2020   Wheelchair dependence 11/27/2019   Spasticity 08/03/2019   Paraplegia (HCC) 07/07/2019   Neurogenic bowel 07/07/2019   Neurogenic bladder 07/07/2019   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  He does not know when they will come fix this, but the order has  been in for 3-4 days.  He brought shoes with shoe lift, but no toe cap - PT showed pt example of toe cap to which he confirms he does not have any shoes with this feature. No falls or other changes.  He presents in manual wheelchair.   Initial Subjective Reports he wants to try and see if he can improve his mobility and get out of therapy with some type of walking on his own. Right leg is a lot weaker than his left. Uses walker at home and walks about every other day and also doing some leg exercises. Currently does not have a gym membership.   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 No  PRECAUTIONS: None  WEIGHT BEARING RESTRICTIONS: No  FALLS:  Has patient fallen in last 6 months? No  LIVING ENVIRONMENT: Lives with: lives with their family and lives with their spouse Lives in: House/apartment Stairs: No Has following equipment at home: Environmental Consultant - 2 wheeled  OCCUPATION: Retired   PLOF: Requires assistive device for independence, Needs assistance with ADLs, and Needs assistance with gait  PATIENT GOALS: Get back into walking on his own   OBJECTIVE: (objective measures from initial evaluation unless otherwise dated)  PATIENT SURVEYS:  LEFS  Extreme difficulty/unable (0), Quite a bit of difficulty (1), Moderate difficulty (2), Little difficulty (3), No difficulty (4) Survey date:  Eval  Any of your usual work, housework or school activities 4  2. Usual hobbies, recreational or sporting activities 4  3. Getting into/out of the bath 3  4. Walking between rooms 4  5. Putting on socks/shoes 4  6. Squatting  2  7. Lifting an object, like a bag of groceries from the floor 3  8. Performing light activities around your home 4  9. Performing heavy activities around your home 3  10. Getting into/out of a car 4  11. Walking 2 blocks 2  12. Walking 1 mile 0  13. Going up/down 10 stairs (1  flight) 2  14. Standing for 1 hour 0  15.  sitting for 1 hour 4  16. Running on even ground 0  17. Running on uneven ground 0  18. Making sharp turns while running fast 0  19. Hopping  0  20. Rolling over in bed 4  Score total:  47/80     COGNITION: Overall cognitive status: Within functional limits for tasks assessed     SENSATION: WFL  POSTURE: rounded shoulders and forward head  LOWER EXTREMITY ROM:  Active ROM Right eval Left eval  Hip flexion    Hip extension    Hip abduction    Hip adduction    Hip internal rotation    Hip external rotation    Knee flexion    Knee extension    Ankle dorsiflexion    Ankle plantarflexion    Ankle inversion    Ankle eversion     (Blank rows =  not tested) *= pain/symptoms  LOWER EXTREMITY MMT:  MMT Right eval Left eval  Hip flexion 3 3  Hip extension 2+ 2+  Hip abduction 2+ 2+  Hip adduction    Hip internal rotation    Hip external rotation    Knee flexion 2+ 5  Knee extension 2+ 5  Ankle dorsiflexion 3 3  Ankle plantarflexion    Ankle inversion    Ankle eversion     (Blank rows = not tested) *= pain/symptoms  FUNCTIONAL TESTS:  2 minute walk test: 72 feet using 2W walker   GAIT: Distance walked: 72 ft Assistive device utilized: Environmental Consultant - 2 wheeled Level of assistance: CGA Comments: wide bos, RLE rotated outward, decreased hip flexion, decreased heel strike    TODAY'S TREATMENT:                                                                                                                              DATE:  03/11/2024 -Pt transfers left squat bump mod I w/c > mat table  -PT assessed left wheelchair brake for safety - unable to adjust appropriately in clinic so recommended pt call and follow-up w/ vendor about repair.  -PT provides PROM and stretching to BLE in supine (hamstrings, glute, IR/ER, DF); edu on caregiver ROM/stretching (ask aide if she is allowed to provide this assistance)  -Supine bridges w/  joint approximation 3x6 -AAROM bent knee fallouts x10 each direction working on slow controlled movement -PPT 2x10  -Pt transfers right squat bump mod I mat table > w/c, PT SBA due to L brake instability PATIENT EDUCATION:  Education details: Discussed toe cap and AFO as it relates to gait efficiency and safety (encouraged to bring AFO to sessions for assessment).  Additions to HEP (standing balance and STS at sink and use supervision from aide in morning as able until comfortable with standing aspects).  Using muscle first before assisting w/ UE when working on strength.  Person educated: Patient Education method: Explanation, Demonstration, and Handouts Education comprehension: verbalized understanding, returned demonstration, verbal cues required, and tactile cues required  HOME EXERCISE PROGRAM: Access Code: [email or web address] URL: [email or web address] Date: 01/23/2024 Prepared by: Lili Finder  Exercises - Standing March with Counter Support  - 1 x daily - 7 x weekly - 3 sets - 10 reps - Standing Hip Extension with Unilateral Counter Support  - 1 x daily - 7 x weekly - 3 sets - 10 reps - Seated Long Arc Quad  - 1 x daily - 7 x weekly - 3 sets - 10 reps - Seated Hamstring Stretch  - 1 x daily - 7 x weekly - 1 sets - 3 reps - 30 seconds hold - Staggered Sit-to-Stand  - 1 x daily - 7 x weekly - 2-3 sets - 5 reps - Seated Hip Abduction  - 1 x daily - 7 x weekly - 2 sets - 10 reps - Side to Side Weight Shift with Counter  Support  - 1 x daily - 7 x weekly - 2 sets - 10 reps - Staggered Stance Forward Backward Weight Shift with Counter Support  - 1 x daily - 7 x weekly - 2 sets - 10 reps  Access Code: [email or web address] URL: [email or web address] Date: 03/11/2024 Prepared by: Daved Bull  Exercises - Supine Ankle Dorsiflexion Stretch with Caregiver  - 1 x daily - 7 x weekly - 1 sets - 2-3 reps - 20 seconds hold - Supine Hip and Knee Flexion PROM with Caregiver  - 1 x  daily - 7 x weekly - 1 sets - 10 reps - Hip Internal and External Rotation Caregiver PROM  - 1 x daily - 7 x weekly - 1 sets - 5 reps - Supine Hamstring Stretch with Caregiver  - 1 x daily - 7 x weekly - 1 sets - 2-3 reps - 30 seconds hold - Supine Single Knee to Chest Stretch  - 1 x daily - 7 x weekly - 1 sets - 2-3 reps - 20 seconds hold  ASSESSMENT:  CLINICAL IMPRESSION: Focus of skilled PT session today on LE stretching and general pelvic and glute musculature engagement to better support postural needs for transfers and gait.  His adductors and glutes remain very weak.  He would likely benefit from trial of simulated toe cap before pursuing order to determine safety and functional benefit.  Will continue per POC.   Initial Impression Patient a 74 y.o. y.o. male who was seen today for physical therapy evaluation and treatment for general weakness. Patient presents with pain limited deficits in lower extremity strength, ROM, endurance, activity tolerance, and functional mobility with ADL. Patient is having to modify and restrict ADL as indicated by outcome measure score as well as subjective information and objective measures which is affecting overall participation. Patient will benefit from skilled physical therapy in order to improve function and reduce impairment.  OBJECTIVE IMPAIRMENTS: Abnormal gait, cardiopulmonary status limiting activity, decreased activity tolerance, decreased balance, decreased endurance, decreased mobility, difficulty walking, decreased ROM, decreased strength, increased muscle spasms, impaired flexibility, improper body mechanics, and pain  ACTIVITY LIMITATIONS: carrying, lifting, bending, standing, squatting, stairs, transfers, bed mobility, locomotion level, and caring for others  PARTICIPATION LIMITATIONS: meal prep, cleaning, laundry, shopping, community activity, occupation, and yard work  PERSONAL FACTORS: Past/current experiences, Time since onset of  injury/illness/exacerbation, and 3+ comorbidities: DM, HTN, T8 ASIA D, paraplegia, neurogenic bowel and bladder, 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   REHAB POTENTIAL: Good  CLINICAL DECISION MAKING: Evolving/moderate complexity  EVALUATION COMPLEXITY: Moderate   GOALS: Goals reviewed with patient? Yes  SHORT TERM GOALS: Target date: 02/06/2024  Patient will be independent with HEP in order to improve functional outcomes. Baseline: Goal status: MET 12/29  2.  Patient will report at least 25% improvement in symptoms for improved quality of life. Baseline: Goal status: INITIAL  LONG TERM GOALS: Target date: 04/02/2024  Patient will report at least 75% improvement in symptoms for improved quality of life. Baseline:  Goal status: INITIAL  2.  Patient will improve LEFS score by at least 9 points in order to indicate improved tolerance to activity. Baseline:  Goal status: INITIAL  3.  Patient will improve to 100 feet with LRAD to indicate improvements in activity tolerance and overall functional mobility. . Baseline:  Goal status: INITIAL  4.  Patient will demonstrate grade at least 4/5 MMT grade in all tested musculature as evidence of improved strength to assist  with stair ambulation and gait.   Baseline:  Goal status: INITIAL   PLAN:  PT FREQUENCY: 1-2x/week  PT DURATION: 10 weeks  PLANNED INTERVENTIONS: 97164- PT Re-evaluation, 97110-Therapeutic exercises, 97530- Therapeutic activity, [id number]- Neuromuscular re-education, 97535- Self Care, [id number]- Manual therapy, [id number]- Gait training, [phone]- Orthotic Fit/training, [phone]- Canalith repositioning, [id number]- Aquatic Therapy, [phone]- Splinting, [phone]- Wound care (first 20 sq cm), 97598- Wound care (each additional 20 sq cm)Patient/Family education, Balance training, Stair training, Taping, Dry Needling, Joint mobilization, Joint manipulation, Spinal manipulation, Spinal mobilization, Scar  mobilization, and DME instructions.  PLAN FOR NEXT SESSION: plan to progress as tolerated, continue LE strengthening, continue gait training, update HEP, RLE NMR - did he bring AFO?  STS, supported squats, terminal knee extension, supine therex for flexibility and strength, SciFit, simulated toe cap w/ gait  Pool:  gait training w/ water walker, standing balance, core work, RLE NMR, general strength/STS/squats/step ups - chair lift   Daved Bull, PT, DPT     "

## 2024-03-13 ENCOUNTER — Encounter: Payer: Self-pay | Admitting: Physical Therapy

## 2024-03-13 ENCOUNTER — Ambulatory Visit: Admitting: Physical Therapy

## 2024-03-13 ENCOUNTER — Encounter (HOSPITAL_BASED_OUTPATIENT_CLINIC_OR_DEPARTMENT_OTHER): Admitting: Physical Therapy

## 2024-03-13 DIAGNOSIS — R2689 Other abnormalities of gait and mobility: Secondary | ICD-10-CM

## 2024-03-13 DIAGNOSIS — R29898 Other symptoms and signs involving the musculoskeletal system: Secondary | ICD-10-CM

## 2024-03-13 DIAGNOSIS — R6889 Other general symptoms and signs: Secondary | ICD-10-CM

## 2024-03-13 DIAGNOSIS — M6281 Muscle weakness (generalized): Secondary | ICD-10-CM | POA: Diagnosis not present

## 2024-03-13 NOTE — Therapy (Signed)
 " OUTPATIENT PHYSICAL THERAPY LOWER EXTREMITY TREATMENT   Patient Name: Timothy Davenport MRN: [id number] DOB:[birth year], 74 y.o., male Today's Date: 03/13/2024  END OF SESSION:  PT End of Session - 03/13/24 0942     Visit Number 6    Number of Visits 20    Date for Recertification  04/02/24    Authorization Type [place]    Authorization Time Period 11/26/23 - 03/25/24    Authorization - Visit Number 5    Authorization - Number of Visits 15    Progress Note Due on Visit 10    PT Start Time 0935    PT Stop Time 1016    PT Time Calculation (min) 41 min    Equipment Utilized During Treatment Gait belt    Activity Tolerance Patient tolerated treatment well    Behavior During Therapy WFL for tasks assessed/performed             Past Medical History:  Diagnosis Date   Colon polyp    Diabetes mellitus without complication (HCC)    Diabetic neuropathy (HCC)    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   HTN (hypertension)    Patella fracture    Renal calculi    Past Surgical History:  Procedure Laterality Date   DEBRIDEMENT AND CLOSURE WOUND N/A 01/19/2022   Procedure: Wound debridement, wound closure, possible wound vac, Myriad placement;  Surgeon: Waddell Leonce NOVAK, MD;  Location: [facility] OR;  Service: Plastics;  Laterality: N/A;   I & D EXTREMITY N/A 01/09/2022   Procedure: EXCISIONAL DEBRIDEMENT BILATERAL GROIN AND LOWER ABDOMEN;  Surgeon: Vanderbilt Ned, MD;  Location: [facility] OR;  Service: General;  Laterality: N/A;   IRRIGATION AND DEBRIDEMENT ABSCESS N/A 01/05/2022   Procedure: IRRIGATION AND DEBRIDEMENT NECROTIZING  TISSUE RECTAL ABSCESS;  Surgeon: Lovie Arlyss LITTIE, MD;  Location: [facility] OR;  Service: Urology;  Laterality: N/A;   LUMBAR LAMINECTOMY/DECOMPRESSION MICRODISCECTOMY N/A 07/01/2019   Procedure: LUMBAR LAMINECTOMY/DECOMPRESSION MICRODISCECTOMY 1 LEVEL, THORACIC SIX-SEVEN;  Surgeon: Lanis Pupa, MD;  Location: [facility] OR;  Service: Neurosurgery;  Laterality: N/A;  LUMBAR  LAMINECTOMY/DECOMPRESSION MICRODISCECTOMY 1 LEVEL, THORACIC SIX-SEVEN    ORIF PATELLA     SCROTAL EXPLORATION N/A 01/07/2022   Procedure: DEBRIDEMENT OF PENIS, SCROTUM, TESTICLES, AND GROIN;  Surgeon: Lovie Arlyss LITTIE, MD;  Location: [facility] OR;  Service: Urology;  Laterality: N/A;   THORACIC DISCECTOMY N/A 07/03/2019   Procedure: Evacuation of Thoracic Hematoma;  Surgeon: Lanis Pupa, MD;  Location: [facility] OR;  Service: Neurosurgery;  Laterality: N/A;   WOUND DEBRIDEMENT N/A 01/07/2022   Procedure: DEBRIDEMENT OF GROIN AND ABDOMINAL WALL;  Surgeon: Dasie Leonor LITTIE, MD;  Location: [facility] OR;  Service: General;  Laterality: N/A;   Patient Active Problem List   Diagnosis Date Noted   Acute delirium 01/18/2022   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   Malnutrition of moderate degree 04/25/2021   Liver lesion    Urinary tract infection with hematuria    Lower GI bleed 04/24/2021   Anemia 04/24/2021   Essential hypertension 04/24/2021   Diabetes mellitus type 2 in nonobese (HCC) 04/24/2021   Acute GI bleeding 04/24/2021   Erectile dysfunction due to diseases classified elsewhere 03/02/2020   Wheelchair dependence 11/27/2019   Spasticity 08/03/2019   Paraplegia (HCC) 07/07/2019   Neurogenic bowel 07/07/2019   Neurogenic bladder 07/07/2019   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 No falls or other changes.  He presents in  manual wheelchair.  He denies pain.  He did not bring his AFO today.   Initial Subjective Reports he wants to try and see if he can improve his mobility and get out of therapy with some type of walking on his own. Right leg is a lot weaker than his left. Uses walker at home and walks about every other day and also doing some leg exercises. Currently does not have a gym membership.   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 No  PRECAUTIONS: None  WEIGHT BEARING RESTRICTIONS: No  FALLS:  Has patient fallen in last 6 months? No  LIVING ENVIRONMENT: Lives with: lives with their family and lives with their spouse Lives in: House/apartment Stairs: No Has following equipment at home: Environmental Consultant - 2 wheeled  OCCUPATION: Retired   PLOF: Requires assistive device for independence, Needs assistance with ADLs, and Needs assistance with gait  PATIENT GOALS: Get back into walking on his own   OBJECTIVE: (objective measures from initial evaluation unless otherwise dated)  PATIENT SURVEYS:  LEFS  Extreme difficulty/unable (0), Quite a bit of difficulty (1), Moderate difficulty (2), Little difficulty (3), No difficulty (4) Survey date:  Eval  Any of your usual work, housework or school activities 4  2. Usual hobbies, recreational or sporting activities 4  3. Getting into/out of the bath 3  4. Walking between rooms 4  5. Putting on socks/shoes 4  6. Squatting  2  7. Lifting an object, like a bag of groceries from the floor 3  8. Performing light activities around your home 4  9. Performing heavy activities around your home 3  10. Getting into/out of a car 4  11. Walking 2 blocks 2  12. Walking 1 mile 0  13. Going up/down 10 stairs (1 flight) 2  14. Standing for 1 hour 0  15.  sitting for 1 hour 4  16. Running on even ground 0  17. Running on uneven ground 0  18. Making sharp turns while running  fast 0  19. Hopping  0  20. Rolling over in bed 4  Score total:  47/80     COGNITION: Overall cognitive status: Within functional limits for tasks assessed     SENSATION: WFL  POSTURE: rounded shoulders and forward head  LOWER EXTREMITY ROM:  Active ROM Right eval Left eval  Hip flexion    Hip extension    Hip abduction    Hip adduction    Hip internal rotation    Hip external rotation    Knee flexion    Knee extension    Ankle dorsiflexion    Ankle plantarflexion    Ankle inversion    Ankle eversion     (Blank rows = not tested) *= pain/symptoms  LOWER EXTREMITY MMT:  MMT Right eval Left eval  Hip flexion 3 3  Hip extension 2+ 2+  Hip abduction 2+ 2+  Hip adduction    Hip  internal rotation    Hip external rotation    Knee flexion 2+ 5  Knee extension 2+ 5  Ankle dorsiflexion 3 3  Ankle plantarflexion    Ankle inversion    Ankle eversion     (Blank rows = not tested) *= pain/symptoms  FUNCTIONAL TESTS:  2 minute walk test: 72 feet using 2W walker   GAIT: Distance walked: 72 ft Assistive device utilized: Environmental Consultant - 2 wheeled Level of assistance: CGA Comments: wide bos, RLE rotated outward, decreased hip flexion, decreased heel strike    TODAY'S TREATMENT:                                                                                                                              DATE:  03/13/2024  GAIT: Gait pattern: step to pattern, step through pattern, decreased stride length, decreased hip/knee flexion- Right, decreased hip/knee flexion- Left, decreased ankle dorsiflexion- Right, decreased ankle dorsiflexion- Left, shuffling, trendelenburg, decreased trunk rotation, trunk flexed, narrow BOS, poor foot clearance- Right, and poor foot clearance- Left Distance walked: 115 ft + 72 ft + 43 ft Assistive device utilized: Walker - 2 wheeled Level of assistance: CGA Comments: RLE fatigues much quicker than left leading to crouched gait and step to pattern  as distance progresses and pt has to drag RLE through swing phase.  No improvement w/ PT facilitating L weight shift during R swing phase.  Trialed 72 ft + 43 ft w/ R simulated toe cap - pt reports noticing no difference (possibly due to fatigue?) but initially does have larger R stride than without toe cap.  Trialed bilateral toe caps x 115 ft w/ improved stride initially; pt first reports these feel slick and shortens stride out of caution, but reports this improved midway through task.  He is greatly fatigued with increased slowing of gait throughout last bout and was educated on use of standing recovery, but pt declines.  *Prolonged seated rest breaks b/w bouts of gait to improve safety and independence w/ task and for longest bouts of gait to assess benefit of toe caps.  PATIENT EDUCATION:  Education details: Discussed toe cap and AFO as it relates to gait efficiency and safety (encouraged to bring AFO to sessions for assessment).  Continue HEP (standing balance and STS at sink and use supervision from aide in morning as able until comfortable with standing aspects).   Person educated: Patient Education method: Explanation, Demonstration, and Handouts Education comprehension: verbalized understanding, returned demonstration, verbal cues required, and tactile cues required  HOME EXERCISE PROGRAM: Access Code: [email or web address] URL: [email or web address] Date: 01/23/2024 Prepared by: Lili Finder  Exercises - Standing March with Counter Support  - 1 x daily - 7 x weekly - 3 sets - 10 reps - Standing Hip Extension with Unilateral Counter Support  - 1 x daily - 7 x weekly - 3 sets - 10 reps - Seated Long Arc Quad  - 1 x daily - 7 x weekly -  3 sets - 10 reps - Seated Hamstring Stretch  - 1 x daily - 7 x weekly - 1 sets - 3 reps - 30 seconds hold - Staggered Sit-to-Stand  - 1 x daily - 7 x weekly - 2-3 sets - 5 reps - Seated Hip Abduction  - 1 x daily - 7 x weekly - 2 sets - 10  reps - Side to Side Weight Shift with Counter Support  - 1 x daily - 7 x weekly - 2 sets - 10 reps - Staggered Stance Forward Backward Weight Shift with Counter Support  - 1 x daily - 7 x weekly - 2 sets - 10 reps  Access Code: [email or web address] URL: [email or web address] Date: 03/11/2024 Prepared by: Daved Bull  Exercises - Supine Ankle Dorsiflexion Stretch with Caregiver  - 1 x daily - 7 x weekly - 1 sets - 2-3 reps - 20 seconds hold - Supine Hip and Knee Flexion PROM with Caregiver  - 1 x daily - 7 x weekly - 1 sets - 10 reps - Hip Internal and External Rotation Caregiver PROM  - 1 x daily - 7 x weekly - 1 sets - 5 reps - Supine Hamstring Stretch with Caregiver  - 1 x daily - 7 x weekly - 1 sets - 2-3 reps - 30 seconds hold - Supine Single Knee to Chest Stretch  - 1 x daily - 7 x weekly - 1 sets - 2-3 reps - 20 seconds hold  ASSESSMENT:  CLINICAL IMPRESSION: Focus of skilled PT session today on gait training to improve endurance of BLE, particularly RLE.  He displays ongoing fatigue with LE drag as distance increased.  This PT felt toe cap helped most on RLE, but could be of potential benefit bilaterally if pt could further adapt to feeling of decreased resistance w/ shuffling gait.  He will consider bringing AFO, but this is not his preferred option for walking mechanics as it is bulky.   Will continue per POC.   Initial Impression Patient a 74 y.o. y.o. male who was seen today for physical therapy evaluation and treatment for general weakness. Patient presents with pain limited deficits in lower extremity strength, ROM, endurance, activity tolerance, and functional mobility with ADL. Patient is having to modify and restrict ADL as indicated by outcome measure score as well as subjective information and objective measures which is affecting overall participation. Patient will benefit from skilled physical therapy in order to improve function and reduce impairment.  OBJECTIVE  IMPAIRMENTS: Abnormal gait, cardiopulmonary status limiting activity, decreased activity tolerance, decreased balance, decreased endurance, decreased mobility, difficulty walking, decreased ROM, decreased strength, increased muscle spasms, impaired flexibility, improper body mechanics, and pain  ACTIVITY LIMITATIONS: carrying, lifting, bending, standing, squatting, stairs, transfers, bed mobility, locomotion level, and caring for others  PARTICIPATION LIMITATIONS: meal prep, cleaning, laundry, shopping, community activity, occupation, and yard work  PERSONAL FACTORS: Past/current experiences, Time since onset of injury/illness/exacerbation, and 3+ comorbidities: DM, HTN, T8 ASIA D, paraplegia, neurogenic bowel and bladder, 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   REHAB POTENTIAL: Good  CLINICAL DECISION MAKING: Evolving/moderate complexity  EVALUATION COMPLEXITY: Moderate   GOALS: Goals reviewed with patient? Yes  SHORT TERM GOALS: Target date: 02/06/2024  Patient will be independent with HEP in order to improve functional outcomes. Baseline: Goal status: MET 12/29  2.  Patient will report at least 25% improvement in symptoms for improved quality of life. Baseline: Goal status: INITIAL  LONG TERM GOALS: Target date:  04/02/2024  Patient will report at least 75% improvement in symptoms for improved quality of life. Baseline:  Goal status: INITIAL  2.  Patient will improve LEFS score by at least 9 points in order to indicate improved tolerance to activity. Baseline:  Goal status: INITIAL  3.  Patient will improve to 100 feet with LRAD to indicate improvements in activity tolerance and overall functional mobility. . Baseline:  Goal status: INITIAL  4.  Patient will demonstrate grade at least 4/5 MMT grade in all tested musculature as evidence of improved strength to assist with stair ambulation and gait.   Baseline:  Goal status:  INITIAL   PLAN:  PT FREQUENCY: 1-2x/week  PT DURATION: 10 weeks  PLANNED INTERVENTIONS: 97164- PT Re-evaluation, 97110-Therapeutic exercises, 97530- Therapeutic activity, [id number]- Neuromuscular re-education, 97535- Self Care, [id number]- Manual therapy, [id number]- Gait training, [phone]- Orthotic Fit/training, [phone]- Canalith repositioning, [id number]- Aquatic Therapy, [phone]- Splinting, [phone]- Wound care (first 20 sq cm), 97598- Wound care (each additional 20 sq cm)Patient/Family education, Balance training, Stair training, Taping, Dry Needling, Joint mobilization, Joint manipulation, Spinal manipulation, Spinal mobilization, Scar mobilization, and DME instructions.  PLAN FOR NEXT SESSION: plan to progress as tolerated, continue LE strengthening, continue gait training, update HEP, RLE NMR - did he bring AFO?  STS, supported squats, terminal knee extension, supine therex for flexibility and strength, SciFit, simulated toe cap w/ gait  Pool:  gait training w/ water walker, standing balance, core work, RLE NMR, general strength/STS/squats/step ups - chair lift   Daved Bull, PT, DPT     "

## 2024-03-17 ENCOUNTER — Ambulatory Visit: Admitting: Physical Therapy

## 2024-03-18 ENCOUNTER — Ambulatory Visit (HOSPITAL_BASED_OUTPATIENT_CLINIC_OR_DEPARTMENT_OTHER): Admitting: Physical Therapy

## 2024-03-19 ENCOUNTER — Ambulatory Visit: Admitting: Physical Therapy

## 2024-03-20 ENCOUNTER — Encounter (HOSPITAL_BASED_OUTPATIENT_CLINIC_OR_DEPARTMENT_OTHER): Admitting: Physical Therapy

## 2024-03-23 ENCOUNTER — Ambulatory Visit: Admitting: Physical Therapy

## 2024-03-24 ENCOUNTER — Telehealth: Payer: Self-pay | Admitting: Physical Therapy

## 2024-03-24 NOTE — Telephone Encounter (Signed)
 PT attempted to call pt x1 this morning in regards to canceling upcoming appts on 2/5 and 2/9 due to lack of [place] auth (submitted today.  LVM asking pt to call front office back about appts.  Will complete re-cert at next authorized land appt (possibly 2/12).  Planning to re-cert 2x/wk for 6 wks if approved.  Will continue to stagger land and aquatic.  Daved Bull, PT, DPT

## 2024-03-25 ENCOUNTER — Ambulatory Visit (HOSPITAL_BASED_OUTPATIENT_CLINIC_OR_DEPARTMENT_OTHER): Admitting: Physical Therapy

## 2024-03-26 ENCOUNTER — Ambulatory Visit: Admitting: Physical Therapy

## 2024-03-27 ENCOUNTER — Encounter (HOSPITAL_BASED_OUTPATIENT_CLINIC_OR_DEPARTMENT_OTHER): Admitting: Physical Therapy

## 2024-03-30 ENCOUNTER — Ambulatory Visit: Admitting: Physical Therapy

## 2024-04-01 ENCOUNTER — Ambulatory Visit (HOSPITAL_BASED_OUTPATIENT_CLINIC_OR_DEPARTMENT_OTHER): Admitting: Physical Therapy

## 2024-04-02 ENCOUNTER — Ambulatory Visit: Admitting: Physical Therapy

## 2024-04-03 ENCOUNTER — Encounter (HOSPITAL_BASED_OUTPATIENT_CLINIC_OR_DEPARTMENT_OTHER): Admitting: Physical Therapy

## 2024-04-06 ENCOUNTER — Ambulatory Visit: Admitting: Physical Therapy

## 2024-04-08 ENCOUNTER — Ambulatory Visit: Admitting: Physical Therapy

## 2024-04-09 ENCOUNTER — Ambulatory Visit: Admitting: Physical Therapy

## 2024-05-06 ENCOUNTER — Ambulatory Visit: Admitting: Neurology

## 2024-06-01 ENCOUNTER — Ambulatory Visit: Admitting: Physical Medicine and Rehabilitation
